# Patient Record
Sex: Male | Born: 1935 | Race: White | Hispanic: No | Smoking: Former smoker
Health system: Southern US, Community
[De-identification: ages and names within clinical notes are randomized; demographics above are authoritative.]

## PROBLEM LIST (undated history)

## (undated) DIAGNOSIS — J449 Chronic obstructive pulmonary disease, unspecified: Secondary | ICD-10-CM

## (undated) DIAGNOSIS — F319 Bipolar disorder, unspecified: Secondary | ICD-10-CM

## (undated) DIAGNOSIS — IMO0001 Reserved for inherently not codable concepts without codable children: Secondary | ICD-10-CM

## (undated) DIAGNOSIS — E039 Hypothyroidism, unspecified: Secondary | ICD-10-CM

## (undated) DIAGNOSIS — C439 Malignant melanoma of skin, unspecified: Secondary | ICD-10-CM

## (undated) DIAGNOSIS — E162 Hypoglycemia, unspecified: Secondary | ICD-10-CM

## (undated) DIAGNOSIS — E669 Obesity, unspecified: Secondary | ICD-10-CM

## (undated) DIAGNOSIS — J189 Pneumonia, unspecified organism: Secondary | ICD-10-CM

## (undated) DIAGNOSIS — F329 Major depressive disorder, single episode, unspecified: Secondary | ICD-10-CM

## (undated) DIAGNOSIS — F32A Depression, unspecified: Secondary | ICD-10-CM

## (undated) DIAGNOSIS — E079 Disorder of thyroid, unspecified: Secondary | ICD-10-CM

## (undated) DIAGNOSIS — I440 Atrioventricular block, first degree: Secondary | ICD-10-CM

## (undated) DIAGNOSIS — I10 Essential (primary) hypertension: Secondary | ICD-10-CM

## (undated) HISTORY — DX: Obesity, unspecified: E66.9

## (undated) HISTORY — DX: Hypoglycemia, unspecified: E16.2

## (undated) HISTORY — DX: Hypothyroidism, unspecified: E03.9

## (undated) HISTORY — DX: Essential (primary) hypertension: I10

## (undated) HISTORY — DX: Bipolar disorder, unspecified: F31.9

## (undated) HISTORY — DX: Chronic obstructive pulmonary disease, unspecified: J44.9

## (undated) HISTORY — DX: Atrioventricular block, first degree: I44.0

## (undated) HISTORY — DX: Reserved for inherently not codable concepts without codable children: IMO0001

---
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
## 1968-01-18 DIAGNOSIS — J189 Pneumonia, unspecified organism: Secondary | ICD-10-CM

## 1968-01-18 HISTORY — DX: Pneumonia, unspecified organism: J18.9

## 1997-04-29 ENCOUNTER — Ambulatory Visit (HOSPITAL_COMMUNITY): Admission: RE | Admit: 1997-04-29 | Discharge: 1997-04-29 | Payer: Self-pay | Admitting: Family Medicine

## 1997-06-18 ENCOUNTER — Ambulatory Visit (HOSPITAL_COMMUNITY): Admission: RE | Admit: 1997-06-18 | Discharge: 1997-06-18 | Payer: Self-pay | Admitting: Psychiatry

## 1997-10-13 ENCOUNTER — Ambulatory Visit (HOSPITAL_COMMUNITY): Admission: RE | Admit: 1997-10-13 | Discharge: 1997-10-13 | Payer: Self-pay | Admitting: Psychiatry

## 1998-04-10 ENCOUNTER — Ambulatory Visit (HOSPITAL_COMMUNITY): Admission: RE | Admit: 1998-04-10 | Discharge: 1998-04-10 | Payer: Self-pay | Admitting: Psychiatry

## 1999-12-23 ENCOUNTER — Ambulatory Visit (HOSPITAL_COMMUNITY): Admission: RE | Admit: 1999-12-23 | Discharge: 1999-12-23 | Payer: Self-pay | Admitting: Family Medicine

## 2001-06-04 ENCOUNTER — Encounter (INDEPENDENT_AMBULATORY_CARE_PROVIDER_SITE_OTHER): Payer: Self-pay | Admitting: *Deleted

## 2001-06-04 ENCOUNTER — Ambulatory Visit (HOSPITAL_COMMUNITY): Admission: RE | Admit: 2001-06-04 | Discharge: 2001-06-04 | Payer: Self-pay | Admitting: Gastroenterology

## 2003-11-27 ENCOUNTER — Encounter: Admission: RE | Admit: 2003-11-27 | Discharge: 2003-11-27 | Payer: Self-pay | Admitting: Otolaryngology

## 2003-11-28 ENCOUNTER — Ambulatory Visit (HOSPITAL_BASED_OUTPATIENT_CLINIC_OR_DEPARTMENT_OTHER): Admission: RE | Admit: 2003-11-28 | Discharge: 2003-11-28 | Payer: Self-pay | Admitting: Otolaryngology

## 2003-11-28 ENCOUNTER — Ambulatory Visit (HOSPITAL_COMMUNITY): Admission: RE | Admit: 2003-11-28 | Discharge: 2003-11-28 | Payer: Self-pay | Admitting: Otolaryngology

## 2004-03-27 ENCOUNTER — Emergency Department (HOSPITAL_COMMUNITY): Admission: EM | Admit: 2004-03-27 | Discharge: 2004-03-27 | Payer: Self-pay | Admitting: Emergency Medicine

## 2004-08-27 ENCOUNTER — Ambulatory Visit (HOSPITAL_COMMUNITY): Admission: RE | Admit: 2004-08-27 | Discharge: 2004-08-27 | Payer: Self-pay | Admitting: Otolaryngology

## 2004-08-27 ENCOUNTER — Ambulatory Visit (HOSPITAL_BASED_OUTPATIENT_CLINIC_OR_DEPARTMENT_OTHER): Admission: RE | Admit: 2004-08-27 | Discharge: 2004-08-27 | Payer: Self-pay | Admitting: Otolaryngology

## 2005-12-19 ENCOUNTER — Ambulatory Visit: Payer: Self-pay | Admitting: Internal Medicine

## 2006-01-17 HISTORY — PX: OTHER SURGICAL HISTORY: SHX169

## 2006-12-18 DIAGNOSIS — E785 Hyperlipidemia, unspecified: Secondary | ICD-10-CM | POA: Insufficient documentation

## 2006-12-18 DIAGNOSIS — G4733 Obstructive sleep apnea (adult) (pediatric): Secondary | ICD-10-CM

## 2006-12-18 DIAGNOSIS — J449 Chronic obstructive pulmonary disease, unspecified: Secondary | ICD-10-CM

## 2006-12-18 DIAGNOSIS — Z85820 Personal history of malignant melanoma of skin: Secondary | ICD-10-CM | POA: Insufficient documentation

## 2006-12-18 DIAGNOSIS — E119 Type 2 diabetes mellitus without complications: Secondary | ICD-10-CM | POA: Insufficient documentation

## 2006-12-19 ENCOUNTER — Ambulatory Visit: Payer: Self-pay | Admitting: Internal Medicine

## 2006-12-20 ENCOUNTER — Ambulatory Visit: Payer: Self-pay | Admitting: Internal Medicine

## 2007-01-18 HISTORY — PX: OTHER SURGICAL HISTORY: SHX169

## 2007-12-19 ENCOUNTER — Ambulatory Visit: Payer: Self-pay | Admitting: Internal Medicine

## 2007-12-19 DIAGNOSIS — F3176 Bipolar disorder, in full remission, most recent episode depressed: Secondary | ICD-10-CM | POA: Insufficient documentation

## 2008-01-18 HISTORY — PX: URETHRA SURGERY: SHX824

## 2008-02-28 ENCOUNTER — Encounter (INDEPENDENT_AMBULATORY_CARE_PROVIDER_SITE_OTHER): Payer: Self-pay | Admitting: *Deleted

## 2008-08-05 ENCOUNTER — Ambulatory Visit: Payer: Self-pay | Admitting: Diagnostic Radiology

## 2008-08-05 ENCOUNTER — Emergency Department (HOSPITAL_BASED_OUTPATIENT_CLINIC_OR_DEPARTMENT_OTHER): Admission: EM | Admit: 2008-08-05 | Discharge: 2008-08-05 | Payer: Self-pay | Admitting: Emergency Medicine

## 2008-08-07 ENCOUNTER — Emergency Department (HOSPITAL_COMMUNITY): Admission: EM | Admit: 2008-08-07 | Discharge: 2008-08-08 | Payer: Self-pay | Admitting: Emergency Medicine

## 2008-08-12 ENCOUNTER — Ambulatory Visit (HOSPITAL_BASED_OUTPATIENT_CLINIC_OR_DEPARTMENT_OTHER): Admission: RE | Admit: 2008-08-12 | Discharge: 2008-08-12 | Payer: Self-pay | Admitting: Urology

## 2008-08-19 ENCOUNTER — Ambulatory Visit (HOSPITAL_COMMUNITY): Admission: RE | Admit: 2008-08-19 | Discharge: 2008-08-19 | Payer: Self-pay | Admitting: Urology

## 2008-08-21 ENCOUNTER — Ambulatory Visit (HOSPITAL_COMMUNITY): Admission: RE | Admit: 2008-08-21 | Discharge: 2008-08-21 | Payer: Self-pay | Admitting: Urology

## 2008-09-29 ENCOUNTER — Encounter (INDEPENDENT_AMBULATORY_CARE_PROVIDER_SITE_OTHER): Payer: Self-pay | Admitting: Urology

## 2008-09-29 ENCOUNTER — Inpatient Hospital Stay (HOSPITAL_COMMUNITY): Admission: RE | Admit: 2008-09-29 | Discharge: 2008-10-01 | Payer: Self-pay | Admitting: Urology

## 2009-02-18 ENCOUNTER — Ambulatory Visit (HOSPITAL_COMMUNITY): Admission: RE | Admit: 2009-02-18 | Discharge: 2009-02-18 | Payer: Self-pay | Admitting: Urology

## 2009-04-09 ENCOUNTER — Encounter: Admission: RE | Admit: 2009-04-09 | Discharge: 2009-04-09 | Payer: Self-pay | Admitting: Urology

## 2009-04-13 ENCOUNTER — Ambulatory Visit (HOSPITAL_BASED_OUTPATIENT_CLINIC_OR_DEPARTMENT_OTHER): Admission: RE | Admit: 2009-04-13 | Discharge: 2009-04-13 | Payer: Self-pay | Admitting: Urology

## 2009-07-09 ENCOUNTER — Encounter: Payer: Self-pay | Admitting: Internal Medicine

## 2009-10-28 ENCOUNTER — Ambulatory Visit (HOSPITAL_COMMUNITY): Admission: RE | Admit: 2009-10-28 | Discharge: 2009-10-28 | Payer: Self-pay | Admitting: Urology

## 2010-02-16 NOTE — Letter (Signed)
Summary: CMN for CPAP Supplies/[facility]  CMN for CPAP Supplies/[facility]   Imported By: Sherian Rein 07/22/2009 13:37:51  _____________________________________________________________________  External Attachment:    Type:   Image     Comment:   External Document

## 2010-04-07 ENCOUNTER — Other Ambulatory Visit (HOSPITAL_COMMUNITY): Payer: Self-pay | Admitting: Urology

## 2010-04-07 DIAGNOSIS — N320 Bladder-neck obstruction: Secondary | ICD-10-CM

## 2010-04-12 LAB — POCT HEMOGLOBIN-HEMACUE: Hemoglobin: 14.5 g/dL (ref 13.0–17.0)

## 2010-04-15 ENCOUNTER — Encounter (HOSPITAL_COMMUNITY)
Admission: RE | Admit: 2010-04-15 | Discharge: 2010-04-15 | Disposition: A | Discharge status: Home / Self Care | Payer: Medicare Other | Source: Ambulatory Visit | Attending: Urology | Admitting: Urology

## 2010-04-15 DIAGNOSIS — Z87442 Personal history of urinary calculi: Secondary | ICD-10-CM | POA: Insufficient documentation

## 2010-04-15 DIAGNOSIS — N135 Crossing vessel and stricture of ureter without hydronephrosis: Secondary | ICD-10-CM | POA: Insufficient documentation

## 2010-04-15 DIAGNOSIS — N320 Bladder-neck obstruction: Secondary | ICD-10-CM

## 2010-04-15 MED ORDER — TECHNETIUM TC 99M MERTIATIDE
15.0000 | Freq: Once | INTRAVENOUS | Status: AC | PRN
  Administered 2010-04-15: 15 via INTRAVENOUS

## 2010-04-15 MED ORDER — FUROSEMIDE 10 MG/ML IJ SOLN: 51.8000 mg | Freq: Once | INTRAMUSCULAR | Status: DC

## 2010-04-23 LAB — ABO/RH: ABO/RH(D): O POS

## 2010-04-23 LAB — BASIC METABOLIC PANEL
BUN: 11 mg/dL (ref 6–23)
BUN: 20 mg/dL (ref 6–23)
BUN: 9 mg/dL (ref 6–23)
CO2: 25 mEq/L (ref 19–32)
CO2: 28 mEq/L (ref 19–32)
CO2: 30 mEq/L (ref 19–32)
Calcium: 8.3 mg/dL — ABNORMAL LOW (ref 8.4–10.5)
Calcium: 8.4 mg/dL (ref 8.4–10.5)
Calcium: 9.3 mg/dL (ref 8.4–10.5)
Chloride: 105 mEq/L (ref 96–112)
Chloride: 108 mEq/L (ref 96–112)
Chloride: 109 mEq/L (ref 96–112)
Creatinine, Ser: 1.11 mg/dL (ref 0.4–1.5)
Creatinine, Ser: 1.17 mg/dL (ref 0.4–1.5)
Creatinine, Ser: 1.25 mg/dL (ref 0.4–1.5)
GFR calc Af Amer: >60 mL/min (ref >60)
GFR calc Af Amer: >60 mL/min (ref >60)
GFR calc Af Amer: >60 mL/min (ref >60)
GFR calc non Af Amer: 57 mL/min — ABNORMAL LOW (ref >60)
GFR calc non Af Amer: >60 mL/min (ref >60)
GFR calc non Af Amer: >60 mL/min (ref >60)
Glucose, Bld: 103 mg/dL — ABNORMAL HIGH (ref 70–99)
Glucose, Bld: 135 mg/dL — ABNORMAL HIGH (ref 70–99)
Glucose, Bld: 148 mg/dL — ABNORMAL HIGH (ref 70–99)
Potassium: 3.9 mEq/L (ref 3.5–5.1)
Potassium: 3.9 mEq/L (ref 3.5–5.1)
Potassium: 4.1 mEq/L (ref 3.5–5.1)
Sodium: 140 mEq/L (ref 135–145)
Sodium: 141 mEq/L (ref 135–145)
Sodium: 142 mEq/L (ref 135–145)

## 2010-04-23 LAB — CBC
HCT: 40.5 % (ref 39.0–52.0)
Hemoglobin: 13.6 g/dL (ref 13.0–17.0)
MCHC: 33.5 g/dL (ref 30.0–36.0)
MCV: 98.6 fL (ref 78.0–100.0)
Platelets: 140 10*3/uL — ABNORMAL LOW (ref 150–400)
RBC: 4.11 MIL/uL — ABNORMAL LOW (ref 4.22–5.81)
RDW: 13.7 % (ref 11.5–15.5)
WBC: 4.4 10*3/uL (ref 4.0–10.5)

## 2010-04-23 LAB — TYPE AND SCREEN
ABO/RH(D): O POS
Antibody Screen: NEGATIVE

## 2010-04-23 LAB — HEMOGLOBIN AND HEMATOCRIT, BLOOD
HCT: 35.7 % — ABNORMAL LOW (ref 39.0–52.0)
HCT: 41.7 % (ref 39.0–52.0)
Hemoglobin: 11.7 g/dL — ABNORMAL LOW (ref 13.0–17.0)
Hemoglobin: 12.3 g/dL — ABNORMAL LOW (ref 13.0–17.0)
Hemoglobin: 14 g/dL (ref 13.0–17.0)

## 2010-04-23 LAB — CREATININE, FLUID (PLEURAL, PERITONEAL, JP DRAINAGE)

## 2010-04-25 LAB — URINALYSIS, ROUTINE W REFLEX MICROSCOPIC
Bilirubin Urine: NEGATIVE
Glucose, UA: NEGATIVE mg/dL
Ketones, ur: 15 mg/dL — AB
Nitrite: NEGATIVE
Nitrite: NEGATIVE
Protein, ur: NEGATIVE mg/dL
Specific Gravity, Urine: 1.007 (ref 1.005–1.030)
Urobilinogen, UA: 1 mg/dL (ref 0.0–1.0)
pH: 7 (ref 5.0–8.0)

## 2010-04-25 LAB — BASIC METABOLIC PANEL
BUN: 19 mg/dL (ref 6–23)
Chloride: 105 mEq/L (ref 96–112)
GFR calc Af Amer: >60 mL/min (ref >60)
GFR calc non Af Amer: >60 mL/min (ref >60)
Potassium: 4.1 mEq/L (ref 3.5–5.1)
Sodium: 141 mEq/L (ref 135–145)

## 2010-04-25 LAB — DIFFERENTIAL
Basophils Absolute: 0.1 10*3/uL (ref 0.0–0.1)
Basophils Relative: 1 % (ref 0–1)
Eosinophils Relative: 0 % (ref 0–5)
Monocytes Absolute: 1.2 10*3/uL — ABNORMAL HIGH (ref 0.1–1.0)
Neutro Abs: 8.8 10*3/uL — ABNORMAL HIGH (ref 1.7–7.7)

## 2010-04-25 LAB — POCT I-STAT, CHEM 8
Calcium, Ion: 1.05 mmol/L — ABNORMAL LOW (ref 1.12–1.32)
Glucose, Bld: 97 mg/dL (ref 70–99)
HCT: 43 % (ref 39.0–52.0)
Hemoglobin: 14.6 g/dL (ref 13.0–17.0)

## 2010-04-25 LAB — CBC
HCT: 45.6 % (ref 39.0–52.0)
Hemoglobin: 14.9 g/dL (ref 13.0–17.0)
MCV: 99.6 fL (ref 78.0–100.0)
Platelets: 121 10*3/uL — ABNORMAL LOW (ref 150–400)
RBC: 4.58 MIL/uL (ref 4.22–5.81)
WBC: 10.5 10*3/uL (ref 4.0–10.5)

## 2010-04-25 LAB — COMPREHENSIVE METABOLIC PANEL
AST: 25 U/L (ref 0–37)
Albumin: 3.6 g/dL (ref 3.5–5.2)
Alkaline Phosphatase: 62 U/L (ref 39–117)
BUN: 21 mg/dL (ref 6–23)
CO2: 20 mEq/L (ref 19–32)
Chloride: 110 mEq/L (ref 96–112)
GFR calc non Af Amer: 46 mL/min — ABNORMAL LOW (ref >60)
Potassium: 4.1 mEq/L (ref 3.5–5.1)
Total Bilirubin: 1 mg/dL (ref 0.3–1.2)

## 2010-04-25 LAB — LIPASE, BLOOD: Lipase: 13 U/L (ref 11–59)

## 2010-05-27 IMAGING — CT CT ANGIO ABDOMEN
1 of 5 series · 8 of 46 positions shown, 13 images · IV contrast (APPLIED)
Comparison: 08/07/2008

CLINICAL DATA: Right hydronephrosis and ureterectasis.

CT ANGIOGRAPHY ABDOMEN AND PELVIS
TECHNIQUE: Multidetector CT imaging of the abdomen and pelvis
using the standard protocol during bolus administration of
intravenous contrast. Multiplanar reconstructed images obtained and
reviewed to evaluate the vascular anatomy.
Contrast: 125 ml Omnipaque 350 IV

[Series 7: venous 5.0 b30f · axial · portal-venous · 0.82mm/px · z∈[-361,-131]mm · 8 of 60 slices shown, 13 images]
[im 7/60  soft-tissue]
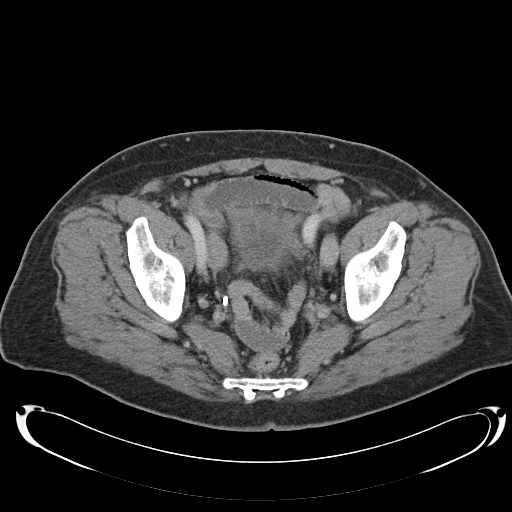
[im 7/60  bone]
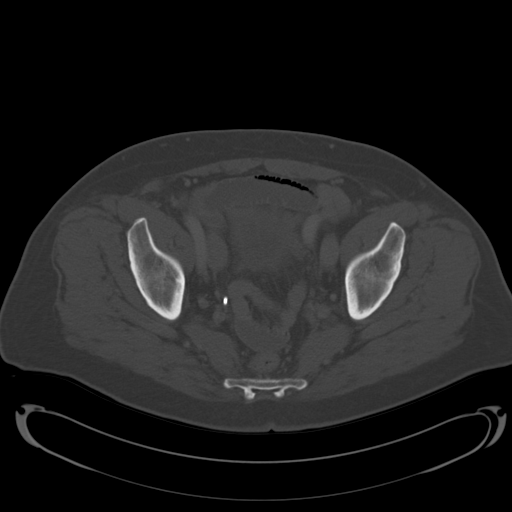
[im 14/60  soft-tissue]
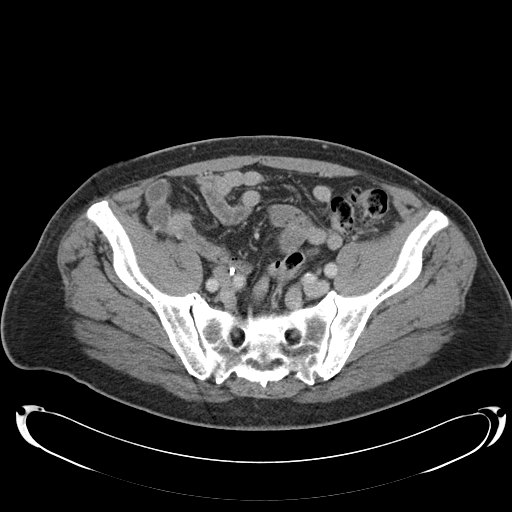
[im 20/60  soft-tissue]
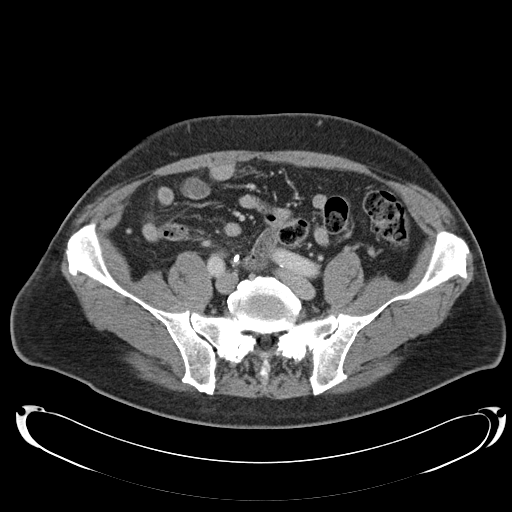
[im 27/60  soft-tissue]
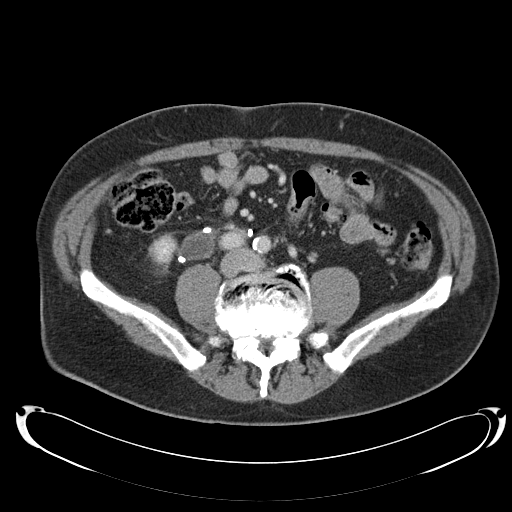
[im 33/60  soft-tissue]
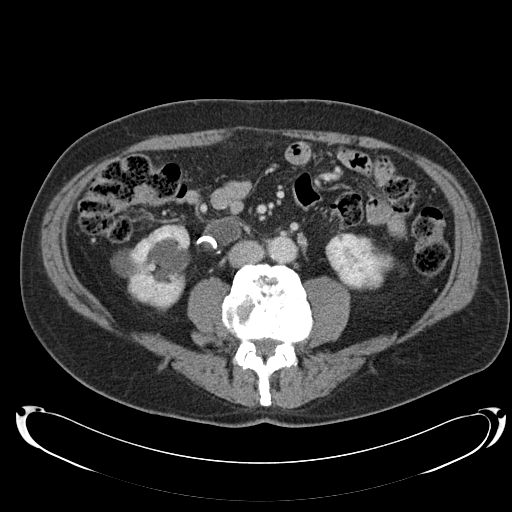
[im 33/60  lung]
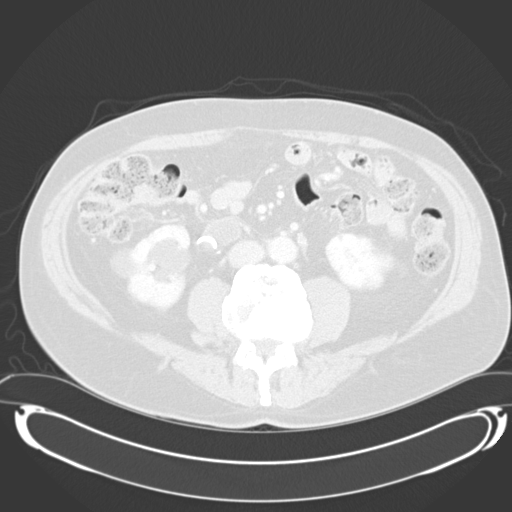
[im 40/60  soft-tissue]
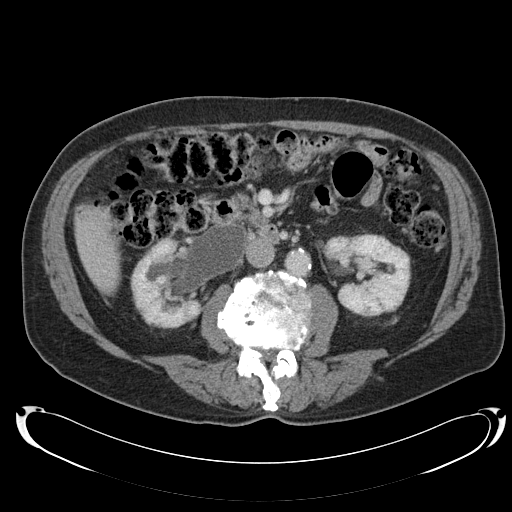
[im 40/60  lung]
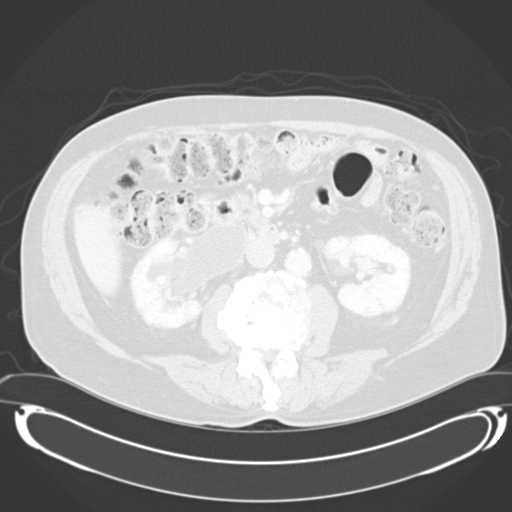
[im 46/60  soft-tissue]
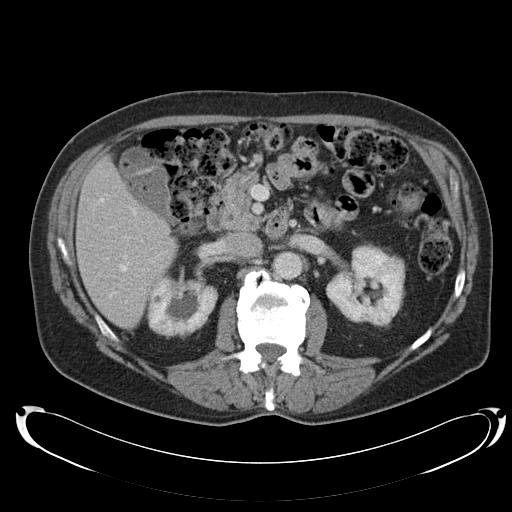
[im 46/60  lung]
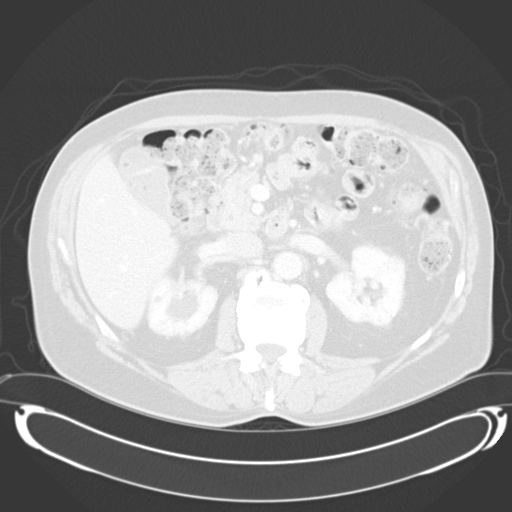
[im 53/60  soft-tissue]
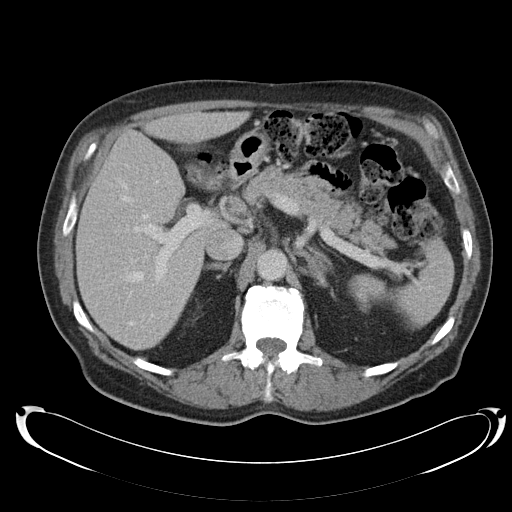
[im 53/60  lung]
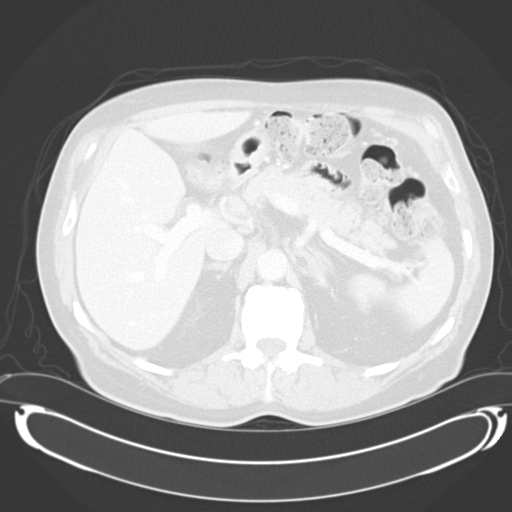

[8 of 46 positions shown; findings below may reference images not displayed]

FINDINGS: Atheromatous nondilated abdominal aorta without
dissection or stenosis.  Celiac axis widely patent.  SMA widely
patent with replaced right hepatic arterial supply, an anatomic
variant.  There is a single left renal artery, unremarkable.  There
are two right renal arteries.  The superior right renal artery is
dominant, without stenosis or other focal lesion.  The inferior
right renal artery is diminutive with an aberrant origin below the
level of the IMA origin, and supplies only a portion of the lower
pole.  It crosses just anterior to the right ureter at the  level
of transition from proximal ureterectasis to the distal
nondistended ureter. The inferior mesenteric artery is patent.
There is plaque at the aortic bifurcation without stenosis.  There
is tortuosity of bilateral common and external iliac arteries
without stenosis.

Venous phase shows patency of hepatic veins, portal vein, splenic
vein, superior mesenteric vein, bilateral renal veins, and IVC.

There is symmetric renal parenchymal enhancement.  Multiple small
bilateral renal cysts, largest from the lower pole right kidney 29
mm in diameter.  There has been significant improvement in the
right hydronephrosis and proximal ureterectasis, with placement of
a double-J ureteral stent.  Urinary bladder is physiologically
distended.  Enlarged prostate is partially seen.  Multiple gas
containing stones are noted in the lumen of the gallbladder which
is nondilated.  Unremarkable visualized portions of liver and
spleen, pancreas, and right adrenal gland.  Stable left adrenal
adenoma.  Advanced spondylitic changes at multiple levels in the
lumbar spine.  Small bowel decompressed.  No free air.  No ascites.
Small umbilical hernia containing only a small amount of mesenteric
fat.
IMPRESSION: 1.  Aberrant inferior right renal artery crosses just anterior to
the right to ureter at the level of transition from proximal
ureterectasis to  normal   caliber.
2.  Interval placement of right ureteral stent with   improvement
in the hydronephrosis and proximal ureterectasis.
3.  Cholelithiasis.
4.  Umbilical hernia.

## 2010-06-01 NOTE — Op Note (Signed)
NAME:  Victor Osborne, Victor Osborne NO.:  [id number]   MEDICAL RECORD NO.:  [id number]          PATIENT TYPE:  AMB   LOCATION:  [facility]                         FACILITY:  [facility]   PHYSICIAN:  Lindaann Slough, M.D.  DATE OF BIRTH:  29-Aug-1932   DATE OF PROCEDURE:  08/12/2008  DATE OF DISCHARGE:                               OPERATIVE REPORT   PREOPERATIVE DIAGNOSIS:  Right hydronephrosis.   POSTOPERATIVE DIAGNOSIS:  Right hydronephrosis.   PROCEDURE:  Cystoscopy, right retrograde pyelogram and insertion of  double-J catheter.   SURGEON:  Dr. Brunilda Payor.   ANESTHESIA:  General.   INDICATIONS:  The patient is a 77 year old male who has been complaining  of back pain on and off for the past 2 years.  About a week ago, he  started having severe right flank pain and went to the emergency room.  A CT scan showed marked right hydronephrosis and hydroureter down to the  sacral promontory without definite evidence of obstruction.  There is no  ureteral stone.  He had appendectomy several years ago.  He is scheduled  today for cystoscopy, retrograde pyelogram and insertion of double-J  stent.   The patient was identified by his wristband, and proper time out was  taken.   Under general anesthesia, the patient was prepped and draped and placed  in the dorsal lithotomy position.  A panendoscope was inserted in the  bladder.  The anterior urethra is normal.  He has trilobar prostatic  hypertrophy.  The bladder mucosa is normal.  There is no stone or tumor  in the bladder.  The ureteral orifices are in normal position and shape.   Retrograde pyelogram with interpretation.   A cone-tip catheter was passed through the cystoscope into the right  ureteral orifice, and contrast was then injected through the cone-tip  catheter.  The distal ureter appears normal.  At the level of the sacral  promontory, the ureter was very tortuous and had a S shape without  evidence of filling defect or  obstruction at that point.  And the ureter  proximal to the sacral promontory was markedly dilated.  The cone-tip  catheter was removed.  The Glidewire was passed through a #5-French open-  ended catheter, and the Glidewire with the open-ended catheter were  passed through the cystoscope into the right ureteral orifice.  The  Glidewire was advanced into the ureter all the way up into the kidney.  The open-ended catheter was then advanced over the Glidewire through the  S deformity of the ureter.  Contrast was then injected through the open-  ended catheter, and the ureter was markedly dilated, but contrast could  not feel the proximal ureter nor the renal pelvis.  The ureteral  catheter could not be advanced all the way up into the renal pelvis  because the ureteral catheter was not long enough.  Because of the  deformity of the ureter it could not go beyond the dilated ureter above  the sacral promontory.   A sensor-tip guidewire was then passed through the open-ended catheter  all the way up into the kidney, and  the open-ended catheter was removed.  A #7-French 28 double-J stent was then passed over the guidewire.  The  proximal curl of the double-J catheter could only reach the junction of  the proximal and mid ureter.  The distal curl is in the bladder.  The  double-J catheter was then left in that position to drain the kidney.  The bladder was then emptied, and the guide wire and cystoscope were  removed.   The patient tolerated the procedure well and left the OR in satisfactory  condition to post-anesthesia care unit.   The question at this point is whether the patient has retro iliac ureter  with S deformity of the ureter.      Lindaann Slough, M.D.  Electronically Signed     MN/MEDQ  D:  08/12/2008  T:  08/12/2008  Job:  [id number]

## 2010-06-01 NOTE — Assessment & Plan Note (Signed)
[facility]                             PULMONARY OFFICE NOTE   NAME:Victor Osborne, Victor Osborne                     MRN:          [id number]  DATE:12/19/2006                            DOB:          05-25-1932    PROBLEM LIST:  1. Obstructive sleep apnea.  2. Mild chronic obstructive pulmonary disease.   HISTORY:  He says wheezing and hacking are fairly persistent, a little  worse with the winter weather.  He gives a history of heart block, and  says he continues to see Dr. Laurelyn Sickle for his bipolar disorder.  He  credits his willpower for losing nearly 60 pounds over the last year and  a half, but says he remains quite comfortable with CPAP set at 9 CWP and  does not want to change.  He sleeps better with it, fine where it is.  Chronic tremor, left hand.  Has had flu vaccine.  Had first pneumococcal  vaccine, 2004.  Is seeking a new primary physician.   MEDICATIONS:  1. Carbamazepine 200 mg.  2. Fluoxetine 2 mg t.i.d.  3. Synthroid 125 mcg.  4. Trifluoperazine 1 b.i.d.  5. Lipitor 40 mg.  6. Zetia 10 mg.  7. CPAP 9 CWP ([facility]).   No medication allergy.   OBJECTIVE:  Weight 229 pounds, BP 120/78, pulse 60, room air saturation  100%.  Tremor, left hand.  He is alert, seems comfortable.  Minor cough, no adenopathy.  Heart sounds regular without murmur.  No edema.   IMPRESSION:  Obstructive sleep apnea is benefitting directly from  significant weight loss.  It is not a casual matter to lose this much  weight, and we got him to agree to a chest x-ray.  There is some degree  of, at least, mild chronic bronchitis.   PLAN:  Chest x-ray, pulmonary function test.  Schedule return 1 year,  earlier p.r.n.  Continue efforts of good sleep hygiene.  Continue  continuous positive airway pressure.     Clinton D. Maple Hudson, MD, Tonny Bollman, FACP  Electronically Signed    CDY/MedQ  DD: 12/19/2006  DT: 12/20/2006  Job #: [id number]

## 2010-06-04 NOTE — Assessment & Plan Note (Signed)
[facility]                             PULMONARY OFFICE NOTE   NAME:Victor Osborne, Victor Osborne                     MRN:          [id number]  DATE:12/19/2005                            DOB:          22-Mar-1932    PULMONARY/SLEEP FOLLOWUP   PROBLEMS:  This is a former chest disease and allergy patient last seen  there in 2005, and coming in now to reestablish for continuity followup  for COPD and obstructive sleep apnea.  He had stopped smoking 21 years  ago.  Office spirometry in November of 2004 had shown an FEV1 of 3200  (77% predicted), an FVC of 4400 (80%), and a ratio of 0.73.  He has been  using CPAP at 9 CWP, which he insists does a super job, and I sleep  great.  From [facility].  He is walking on a treadmill about a  half hour a day, but admits he has been skipping more recently.  He does  not notice dyspnea with that level of exercise.  He quit Advair, which  did not seem to be much help.  He and his wife are watching their diets  very carefully, and he has lost about 50 pounds in the last 2 years,  mostly with the diet change.  Usual bedtime around 8 p.m., estimating  sleep onset 15 minutes, and awakening twice during the night before  final waking at 6 a.m.   REVIEW OF SYSTEMS:  Occasionally, if he exercises very hard, he will  have crawling sensations in his legs.  He denies cough, wheeze, chest  pain, palpitations, sputum, ankle edema, blood, or fever.  There is  occasional bruxism and he does admit to some depression.   PAST HISTORY:  Hospitalized twice with pneumonias.  Diagnosed with  emphysema.  Bipolar.  Lithium affected his thyroid and he is on thyroid  replacement.  Surgical resection of a melanoma from his forehead.  Elevated cholesterol.  Surgery for appendectomy.  No history of heart  disease otherwise.   SOCIAL HISTORY:  Quit smoking at age 89, 21 years ago.  Quit alcohol in  1976.  Married, retired from [employer], [employer] work.   FAMILY HISTORY:  Cancer.  His wife is also on CPAP.   OBJECTIVE:  Weight 244 pounds, BP 132/82, pulse regular 53, room air  saturation 100%.  This is a tall, bearded gentleman, pleasant and alert.  Palate length 2/4.  There is no neck vein distension or stridor.  No thyromegaly.  LUNGS:  Fields are clear.  HEART:  Sounds regular without murmur.  There is no tremor, cyanosis, or edema.   IMPRESSION:  1. Obstructive sleep apnea, well controlled and comfortable on      continuous positive airway pressure at 9 centimeters of water      pressure through [place] with no changes intended at this time.  We      talked about treatment options, and emphasized his responsibility      to drive carefully.  2. Mild chronic obstructive pulmonary disease residual from his  smoking history.   PLAN:  No specific changes needed.  Return in 12 months, earlier p.r.n.     Clinton D. Maple Hudson, MD, Tonny Bollman, FACP  Electronically Signed    CDY/MedQ  DD: 12/19/2005  DT: 12/20/2005  Job #: [id number]   cc:   Al Decant. Janey Greaser, MD

## 2010-06-04 NOTE — Op Note (Signed)
NAME:  Victor Osborne, Victor Osborne NO.:  [id number]   MEDICAL RECORD NO.:  [id number]          PATIENT TYPE:  AMB   LOCATION:  [facility]                          FACILITY:  [facility]   PHYSICIAN:  Christopher E. 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   OPERATION:  Left meatoplasty with canalplasty.   SURGEON:  Kristine Garbe. Ezzard Standing, M.D.   ANESTHESIA:  General endotracheal.   COMPLICATIONS:  None.   BRIEF CLINICAL NOTE:  [name] is a 77 year old gentleman who has very  narrowed ear canals bilaterally with bony exostoses.  I could only  adequately visualize the posterior one-quarter of the eardrum.  He has  exostoses anteriorly as well as some posteriorly superiorly.  He has had  recurrent bouts of external otitis over the past year I suspect are related  to the very narrowed ear canals.  He is taken to the operating room at this  time for canalplasty to enlarge the ear canals.   DESCRIPTION OF PROCEDURE:  After adequate endotracheal anesthesia, the ear  was cleaned and irrigated with saline.  The ear canal was then injected with  Xylocaine with epinephrine for a local anesthetic.  The primary narrowing or  bulge of the external bony ear canal was anteriorly, which obscured  visualization of the anterior three-quarters of the TM.  There was  additionally some bony exostosis posteriorly superiorly.  An anterior-based  tympanomeatal flap was elevated down to the annulus.  After elevating the  tympanomeatal flap down to the annulus, it was reflected posteriorly.  Using  the 1 mm cutting bur, the bony prominence in the anterior ear canal was  removed.  At one point I used a small 2 mm osteotome to help to knock off  some of the edge of the bone.  I also used bone curettes.  After removing  several millimeters of anterior prominent bone, I was able to visualize the  umbo and a portion of the anterior eardrum.  This completed the anterior  portion of the procedure.  Next there was another exostosis posterior  superiorly, and mucoperiosteum was elevated off of this and using the 1 mm  cutting bur, this bony exostosis was removed.  This completed the procedure.  The ear was irrigated with saline and cleaned of all the bony fragments and  bone dust.  The ear canal was then packed with Gelfoam soaked in Ciprodex.  A cotton ball was placed as an external ear dressing.  The patient received  1 g of Ancef IV intraoperatively.   DISPOSITION:  [name] is discharged home later this morning on Keflex 500 mg  b.i.d. for five days, Tylenol and Tylenol No. 3 p.r.n. pain.  We will have  him follow up in my office in three weeks for recheck and  cleaning the ear  canal.  I instructed him to keep all water out of the ear.       CEN/MEDQ  D:  11/28/2003  T:  11/29/2003  Job:  [id number]

## 2010-06-04 NOTE — Op Note (Signed)
NAME:  [name], [name] NO.:  [id number]   MEDICAL RECORD NO.:  [id number]          PATIENT TYPE:  AMB   LOCATION:  [facility]                          FACILITY:  [facility]   PHYSICIAN:  Kristine Garbe. Ezzard Standing, M.D.DATE OF BIRTH:  1932/04/04   DATE OF PROCEDURE:  08/27/2004  DATE OF DISCHARGE:                                 OPERATIVE REPORT   PREOPERATIVE DIAGNOSIS:  Right external ear canal stenosis with exostosis.   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   OPERATION:  Right external ear canalplasty.   SURGEON:  Kristine Garbe. Ezzard Standing, M.D.   ANESTHESIA:  General endotracheal.   COMPLICATIONS:  None.   CLINICAL NOTE:  Victor Osborne is a 77 year old gentleman who has had  chronic ear canal problems because of very small ear canals with prominent  anterior canal exostosis.  He has had previous canalplasty on the left side  and has done well since having that done on the right side.  You can barely  visualize the posterior portion of the TM.  Had no visualization of the umbo  or malleus.  He has a tendency to build up wax and debris in the ear canal.  He is taken to the operating room at this time for right canalplasty.   DESCRIPTION OF PROCEDURE:  After adequate endotracheal anesthesia, the ear  canal was prepped with Betadine solution.  The patient received 1 gm of  Ancef preoperatively.  The ear canal was then further irrigated with saline.  The ear canal was injected with Xylocaine with epinephrine for hemostasis.  An anterior-based tympanomeatal flap was created just lateral to the  prominent anterior bony bulge exostosis.  The posterior portion of the canal  really was not involved.  Of note, he did have a very small ear canal  anyway.  After elevating the mucoperiosteum off of the prominent bony bulge,  using 1 mm cutting bur, 2 mm osteotome, and bone curettes, the prominent  bulge was removed.  This allowed visualization of the  umbo.  After I got  visualization of the umbo, the procedure was completed.  The tympanomeatal  flap mucosa was brought back up onto the reduced bone and osteoma.  The ear  was irrigated with saline.  The ear canal was then packed with the Gelfoam  soaked in Ciprodex.  A cotton ball was placed in the ear followed by a Band-  Aid.  He tolerated this well, was awakened from anesthesia, and transferred  to the recovery room and on postop doing well.   DISPOSITION:  [name] is discharged home late this morning on Tylenol #3  p.r.n. pain.  He is instructed to keep water out of the ear.  We will have  him follow up in my office in two weeks for recheck.       CEN/MEDQ  D:  08/27/2004  T:  08/27/2004  Job:  [id number]

## 2010-08-02 ENCOUNTER — Ambulatory Visit: Payer: Medicare Other | Attending: Neurology | Admitting: Physical Therapy

## 2010-08-02 DIAGNOSIS — IMO0001 Reserved for inherently not codable concepts without codable children: Secondary | ICD-10-CM | POA: Insufficient documentation

## 2010-08-02 DIAGNOSIS — R269 Unspecified abnormalities of gait and mobility: Secondary | ICD-10-CM | POA: Insufficient documentation

## 2010-08-05 ENCOUNTER — Ambulatory Visit: Payer: Medicare Other | Admitting: Physical Therapy

## 2010-08-10 ENCOUNTER — Ambulatory Visit: Payer: Medicare Other | Admitting: Physical Therapy

## 2010-08-13 ENCOUNTER — Ambulatory Visit: Payer: Medicare Other | Admitting: Physical Therapy

## 2010-08-16 ENCOUNTER — Ambulatory Visit: Payer: Medicare Other | Admitting: Physical Therapy

## 2010-08-18 ENCOUNTER — Ambulatory Visit: Payer: Medicare Other | Attending: Neurology | Admitting: Physical Therapy

## 2010-08-18 DIAGNOSIS — IMO0001 Reserved for inherently not codable concepts without codable children: Secondary | ICD-10-CM | POA: Insufficient documentation

## 2010-08-18 DIAGNOSIS — R269 Unspecified abnormalities of gait and mobility: Secondary | ICD-10-CM | POA: Insufficient documentation

## 2010-08-23 ENCOUNTER — Ambulatory Visit: Payer: Medicare Other | Admitting: Physical Therapy

## 2010-08-25 ENCOUNTER — Ambulatory Visit: Payer: Medicare Other | Admitting: Physical Therapy

## 2010-08-30 ENCOUNTER — Ambulatory Visit: Payer: Medicare Other | Admitting: Physical Therapy

## 2010-09-01 ENCOUNTER — Ambulatory Visit: Payer: Medicare Other | Admitting: Physical Therapy

## 2010-09-07 ENCOUNTER — Ambulatory Visit: Payer: Medicare Other | Admitting: Physical Therapy

## 2010-09-13 ENCOUNTER — Ambulatory Visit: Payer: Medicare Other | Admitting: Physical Therapy

## 2010-09-16 ENCOUNTER — Ambulatory Visit: Payer: Medicare Other | Admitting: Physical Therapy

## 2010-09-21 ENCOUNTER — Ambulatory Visit: Payer: Medicare Other | Attending: Neurology | Admitting: Physical Therapy

## 2010-09-21 DIAGNOSIS — R269 Unspecified abnormalities of gait and mobility: Secondary | ICD-10-CM | POA: Insufficient documentation

## 2010-09-21 DIAGNOSIS — IMO0001 Reserved for inherently not codable concepts without codable children: Secondary | ICD-10-CM | POA: Insufficient documentation

## 2010-09-23 ENCOUNTER — Other Ambulatory Visit: Payer: Self-pay | Admitting: Neurology

## 2010-09-23 DIAGNOSIS — G20 Parkinson's disease: Secondary | ICD-10-CM

## 2010-09-23 DIAGNOSIS — R269 Unspecified abnormalities of gait and mobility: Secondary | ICD-10-CM

## 2010-09-24 ENCOUNTER — Ambulatory Visit: Payer: Medicare Other | Admitting: Physical Therapy

## 2010-09-27 ENCOUNTER — Ambulatory Visit: Payer: Medicare Other | Admitting: Physical Therapy

## 2010-09-28 ENCOUNTER — Ambulatory Visit
Admission: RE | Admit: 2010-09-28 | Discharge: 2010-09-28 | Disposition: A | Discharge status: Home / Self Care | Payer: Medicare Other | Source: Ambulatory Visit | Attending: Neurology | Admitting: Neurology

## 2010-09-28 DIAGNOSIS — G20A1 Parkinson's disease without dyskinesia, without mention of fluctuations: Secondary | ICD-10-CM

## 2010-09-28 DIAGNOSIS — G20 Parkinson's disease: Secondary | ICD-10-CM

## 2010-09-28 DIAGNOSIS — R269 Unspecified abnormalities of gait and mobility: Secondary | ICD-10-CM

## 2010-09-29 ENCOUNTER — Ambulatory Visit: Payer: Medicare Other | Admitting: Physical Therapy

## 2010-10-07 ENCOUNTER — Other Ambulatory Visit (HOSPITAL_COMMUNITY): Payer: Self-pay | Admitting: Urology

## 2010-10-27 ENCOUNTER — Encounter (HOSPITAL_COMMUNITY)
Admission: RE | Admit: 2010-10-27 | Discharge: 2010-10-27 | Disposition: A | Discharge status: Home / Self Care | Payer: Medicare Other | Source: Ambulatory Visit | Attending: Urology | Admitting: Urology

## 2010-10-27 ENCOUNTER — Encounter (HOSPITAL_COMMUNITY): Payer: Self-pay

## 2010-10-27 DIAGNOSIS — Q6239 Other obstructive defects of renal pelvis and ureter: Secondary | ICD-10-CM | POA: Insufficient documentation

## 2010-10-27 MED ORDER — TECHNETIUM TC 99M MERTIATIDE: 15.5000 | Freq: Once | INTRAVENOUS | Status: AC | PRN

## 2010-10-27 MED ORDER — FUROSEMIDE 10 MG/ML IJ SOLN: 52.0000 mg | Freq: Once | INTRAMUSCULAR | Status: DC

## 2010-11-05 ENCOUNTER — Other Ambulatory Visit: Payer: Self-pay | Admitting: Dermatology

## 2010-12-02 ENCOUNTER — Ambulatory Visit: Payer: Medicare Other | Admitting: Internal Medicine

## 2010-12-27 ENCOUNTER — Encounter: Payer: Self-pay | Admitting: Internal Medicine

## 2010-12-28 ENCOUNTER — Ambulatory Visit (INDEPENDENT_AMBULATORY_CARE_PROVIDER_SITE_OTHER): Payer: Medicare Other | Admitting: Internal Medicine

## 2010-12-28 ENCOUNTER — Encounter: Payer: Self-pay | Admitting: Internal Medicine

## 2010-12-28 ENCOUNTER — Ambulatory Visit (INDEPENDENT_AMBULATORY_CARE_PROVIDER_SITE_OTHER)
Admission: RE | Admit: 2010-12-28 | Discharge: 2010-12-28 | Disposition: A | Discharge status: Home / Self Care | Payer: Medicare Other | Source: Ambulatory Visit | Attending: Internal Medicine | Admitting: Internal Medicine

## 2010-12-28 DIAGNOSIS — J449 Chronic obstructive pulmonary disease, unspecified: Secondary | ICD-10-CM

## 2010-12-28 DIAGNOSIS — G4733 Obstructive sleep apnea (adult) (pediatric): Secondary | ICD-10-CM

## 2010-12-28 NOTE — Patient Instructions (Signed)
Order- CXR- dx COPD  Order- [facility]- [facility]  CPAP 9 cwp with replacement mask of choice, humidifier and supplies   Dx OSA

## 2010-12-28 NOTE — Progress Notes (Signed)
12/28/10- 78 yoM former smoker, followed for COPD, OSA, complicated by BiPolar, HBP, hx melanoma, DM LOV-12/19/2007 Has had flu vaccine. He had changed his CPAP company to [employer], but complains that his mask is old and worn out he can't wear it. Therefore he has not used CPAP in some months. His wife tells him that without CPAP he snores loudly and talks in his sleep. He recognizes he is not sleeping as well. His COPD has been stable within significant cough and wheeze. He denies noticing shortness of breath while walking. He has no inhaled medications. CXR-04/09/2009-COPD, no active process.  ROS-see HPI Constitutional:   No-   weight loss, night sweats, fevers, chills, fatigue, lassitude. HEENT:   No-  headaches, difficulty swallowing, tooth/dental problems, sore throat,       No-  sneezing, itching, ear ache, nasal congestion, post nasal drip,  CV:  No-   chest pain, orthopnea, PND, swelling in lower extremities, anasarca,                                  dizziness, palpitations Resp: No-   shortness of breath with exertion or at rest.              No-   productive cough,  No non-productive cough,  No- coughing up of blood.              No-   change in color of mucus.  No- wheezing.   Skin: No-   rash or lesions. GI:  No-   heartburn, indigestion, abdominal pain, nausea, vomiting, diarrhea,                 change in bowel habits, loss of appetite GU: No-   dysuria, change in color of urine, no urgency or frequency.  No- flank pain. MS:  + joint pain or swelling.  No- decreased range of motion.  No- back pain. Neuro-     nothing unusual Psych:  No- change in mood or affect. No depression or anxiety.  No memory loss.  OBJ General- Alert, Oriented, Affect-appropriate, Distress- none acute, looks well Skin- rash-none, lesions- none, excoriation- none Lymphadenopathy- none Head- atraumatic            Eyes- Gross vision intact, PERRLA, conjunctivae clear secretions            Ears- Hearing,  canals-normal            Nose- Clear, no-Septal dev, mucus, polyps, erosion, perforation             Throat- Mallampati II , mucosa clear , drainage- none, tonsils- atrophic Neck- flexible , trachea midline, no stridor , thyroid nl, carotid no bruit Chest - symmetrical excursion , unlabored           Heart/CV- RRR , no murmur , no gallop  , no rub, nl s1 s2                           - JVD- none , edema- none, stasis changes- none, varices- none           Lung- slight rattle right base, unlabored, wheeze- none, cough- none , dullness-none, rub- none           Chest wall-  Abd- tender-no, distended-no, bowel sounds-present, HSM- no Br/ Gen/ Rectal- Not done, not indicated Extrem- cyanosis- none, clubbing, none, atrophy- none,  strength- nl, using a cane. L ankle wrapped "bone spurs" Neuro- grossly intact to observation

## 2010-12-31 ENCOUNTER — Telehealth: Payer: Self-pay | Admitting: Internal Medicine

## 2010-12-31 NOTE — Telephone Encounter (Signed)
Spoke with patient about his CXR results. Pt understands and no questions at this time.

## 2010-12-31 NOTE — Progress Notes (Signed)
Quick Note:  Pt aware of results. ______ 

## 2011-01-01 NOTE — Assessment & Plan Note (Signed)
He denies symptoms and has had flu vaccine. Will update chest x-ray and watch this issue over time.

## 2011-01-01 NOTE — Assessment & Plan Note (Signed)
He needs replacement CPAP supplies and mask. I think before the mask failed, compliance and control for good with CPAP and I think he is motivated to get started back again.

## 2011-01-28 ENCOUNTER — Other Ambulatory Visit: Payer: Self-pay | Admitting: Urology

## 2011-01-31 ENCOUNTER — Encounter (HOSPITAL_COMMUNITY): Payer: Self-pay | Admitting: Pharmacy Technician

## 2011-02-02 ENCOUNTER — Ambulatory Visit (HOSPITAL_COMMUNITY)
Admission: RE | Admit: 2011-02-02 | Discharge: 2011-02-02 | Disposition: A | Discharge status: Home / Self Care | Payer: Medicare Other | Source: Ambulatory Visit | Attending: Urology | Admitting: Urology

## 2011-02-02 ENCOUNTER — Encounter (HOSPITAL_COMMUNITY): Payer: Self-pay

## 2011-02-02 ENCOUNTER — Other Ambulatory Visit: Payer: Self-pay

## 2011-02-02 ENCOUNTER — Encounter (HOSPITAL_COMMUNITY)
Admission: RE | Admit: 2011-02-02 | Discharge: 2011-02-02 | Disposition: A | Discharge status: Home / Self Care | Payer: Medicare Other | Source: Ambulatory Visit | Attending: Urology | Admitting: Urology

## 2011-02-02 DIAGNOSIS — Z01818 Encounter for other preprocedural examination: Secondary | ICD-10-CM | POA: Insufficient documentation

## 2011-02-02 DIAGNOSIS — I498 Other specified cardiac arrhythmias: Secondary | ICD-10-CM | POA: Insufficient documentation

## 2011-02-02 DIAGNOSIS — I440 Atrioventricular block, first degree: Secondary | ICD-10-CM | POA: Insufficient documentation

## 2011-02-02 DIAGNOSIS — Z01810 Encounter for preprocedural cardiovascular examination: Secondary | ICD-10-CM | POA: Insufficient documentation

## 2011-02-02 DIAGNOSIS — Z87891 Personal history of nicotine dependence: Secondary | ICD-10-CM | POA: Insufficient documentation

## 2011-02-02 DIAGNOSIS — Z01812 Encounter for preprocedural laboratory examination: Secondary | ICD-10-CM | POA: Insufficient documentation

## 2011-02-02 HISTORY — DX: Pneumonia, unspecified organism: J18.9

## 2011-02-02 HISTORY — DX: Major depressive disorder, single episode, unspecified: F32.9

## 2011-02-02 HISTORY — DX: Depression, unspecified: F32.A

## 2011-02-02 LAB — BASIC METABOLIC PANEL
CO2: 27 mEq/L (ref 19–32)
Calcium: 9.7 mg/dL (ref 8.4–10.5)
GFR calc Af Amer: 74 mL/min — ABNORMAL LOW (ref >90)
GFR calc non Af Amer: 64 mL/min — ABNORMAL LOW (ref >90)
Sodium: 140 mEq/L (ref 135–145)

## 2011-02-02 LAB — CBC
MCH: 32.4 pg (ref 26.0–34.0)
Platelets: 166 10*3/uL (ref 150–400)
RBC: 4.78 MIL/uL (ref 4.22–5.81)
WBC: 5.8 10*3/uL (ref 4.0–10.5)

## 2011-02-02 LAB — SURGICAL PCR SCREEN: MRSA, PCR: NEGATIVE

## 2011-02-02 NOTE — Patient Instructions (Addendum)
20 Victor Osborne  02/02/2011   Your procedure is scheduled on:02-04-2011  Report to [facility] at 1200 PM.  Call this number if you have problems the morning of surgery: [phone]   Remember:BRING CPAP MASK ONLY   Do not eat food:After Midnight.  May have clear liquids:up to 6 Hours before arrival.CLEAR LIQUIDS MIDNIGHT UNTIL 0800AM, THE NOTHING  Clear liquids include soda, tea, black coffee, apple or grape juice, broth.  Take these medicines the morning of surgery with A SIP OF WATER TEGRETOL, PROZAC, LEVOTHRYOID, STELAZINE:   Do not wear jewelry,   Do not wear lotions, powders, or  COLOGNES,  do not may wear deodorant. .  Do not bring valuables to the hospital.  Contacts, dentures or bridgework may not be worn into surgery.  Leave suitcase in the car. After surgery it may be brought to your room.  For patients admitted to the hospital, checkout time is 11:00 AM the day of discharge.   Patients discharged the day of surgery will not be allowed to drive home.    Special Instructions: CHG Shower Use Special Wash: 1/2 bottle night before surgery and 1/2 bottle morning of surgery.   Please read over the following fact sheets that you were given: MRSA Information [name], rn wl pre op nurse phone number [phone]

## 2011-02-04 ENCOUNTER — Encounter (HOSPITAL_COMMUNITY)
Admission: RE | Disposition: A | Discharge status: Home / Self Care | Payer: Self-pay | Source: Ambulatory Visit | Attending: Urology

## 2011-02-04 ENCOUNTER — Encounter (HOSPITAL_COMMUNITY): Payer: Self-pay | Admitting: Anesthesiology

## 2011-02-04 ENCOUNTER — Ambulatory Visit (HOSPITAL_COMMUNITY): Payer: Medicare Other | Admitting: Anesthesiology

## 2011-02-04 ENCOUNTER — Other Ambulatory Visit: Payer: Self-pay | Admitting: Urology

## 2011-02-04 ENCOUNTER — Ambulatory Visit (HOSPITAL_COMMUNITY)
Admission: RE | Admit: 2011-02-04 | Discharge: 2011-02-05 | Disposition: A | Discharge status: Home / Self Care | Payer: Medicare Other | Source: Ambulatory Visit | Attending: Urology | Admitting: Urology

## 2011-02-04 ENCOUNTER — Encounter (HOSPITAL_COMMUNITY): Payer: Self-pay | Admitting: *Deleted

## 2011-02-04 DIAGNOSIS — N401 Enlarged prostate with lower urinary tract symptoms: Secondary | ICD-10-CM | POA: Insufficient documentation

## 2011-02-04 DIAGNOSIS — G4730 Sleep apnea, unspecified: Secondary | ICD-10-CM | POA: Insufficient documentation

## 2011-02-04 DIAGNOSIS — E7800 Pure hypercholesterolemia, unspecified: Secondary | ICD-10-CM | POA: Insufficient documentation

## 2011-02-04 DIAGNOSIS — Z85820 Personal history of malignant melanoma of skin: Secondary | ICD-10-CM | POA: Insufficient documentation

## 2011-02-04 DIAGNOSIS — R32 Unspecified urinary incontinence: Secondary | ICD-10-CM | POA: Insufficient documentation

## 2011-02-04 DIAGNOSIS — N138 Other obstructive and reflux uropathy: Secondary | ICD-10-CM | POA: Insufficient documentation

## 2011-02-04 DIAGNOSIS — E039 Hypothyroidism, unspecified: Secondary | ICD-10-CM | POA: Insufficient documentation

## 2011-02-04 DIAGNOSIS — Z79899 Other long term (current) drug therapy: Secondary | ICD-10-CM | POA: Insufficient documentation

## 2011-02-04 HISTORY — PX: TRANSURETHRAL RESECTION OF PROSTATE: SHX73

## 2011-02-04 LAB — BASIC METABOLIC PANEL
BUN: 17 mg/dL (ref 6–23)
CO2: 24 mEq/L (ref 19–32)
Calcium: 8.9 mg/dL (ref 8.4–10.5)
Creatinine, Ser: 1.15 mg/dL (ref 0.50–1.35)
Glucose, Bld: 80 mg/dL (ref 70–99)
Sodium: 143 mEq/L (ref 135–145)

## 2011-02-04 SURGERY — TURP (TRANSURETHRAL RESECTION OF PROSTATE)
Anesthesia: General | Site: Prostate | Wound class: Clean Contaminated

## 2011-02-04 MED ORDER — EPHEDRINE SULFATE 50 MG/ML IJ SOLN
INTRAMUSCULAR | Status: DC | PRN
  Administered 2011-02-04 (×2): 10 mg via INTRAVENOUS

## 2011-02-04 MED ORDER — LACTATED RINGERS IV SOLN
INTRAVENOUS | Status: DC | PRN
  Administered 2011-02-04 (×2): via INTRAVENOUS

## 2011-02-04 MED ORDER — MIDAZOLAM HCL 5 MG/5ML IJ SOLN
INTRAMUSCULAR | Status: DC | PRN
  Administered 2011-02-04: 2 mg via INTRAVENOUS

## 2011-02-04 MED ORDER — HYDROCODONE-ACETAMINOPHEN 5-300 MG PO TABS: 1.0000 | ORAL_TABLET | Freq: Four times a day (QID) | ORAL | Status: DC

## 2011-02-04 MED ORDER — STERILE WATER FOR IRRIGATION IR SOLN
Status: DC | PRN
  Administered 2011-02-04: 500 mL

## 2011-02-04 MED ORDER — FENTANYL CITRATE 0.05 MG/ML IJ SOLN
INTRAMUSCULAR | Status: DC | PRN
  Administered 2011-02-04: 50 ug via INTRAVENOUS

## 2011-02-04 MED ORDER — DIPHENHYDRAMINE HCL 50 MG/ML IJ SOLN: 12.5000 mg | Freq: Four times a day (QID) | INTRAMUSCULAR | Status: DC | PRN

## 2011-02-04 MED ORDER — SODIUM CHLORIDE 0.9 % IV SOLN
250.0000 mL | INTRAVENOUS | Status: DC
  Administered 2011-02-05: 250 mL via INTRAVENOUS

## 2011-02-04 MED ORDER — SODIUM CHLORIDE 0.9 % IR SOLN
3000.0000 mL | Status: DC
  Administered 2011-02-04: 3000 mL

## 2011-02-04 MED ORDER — SULFAMETHOXAZOLE-TMP DS 800-160 MG PO TABS: 1.0000 | ORAL_TABLET | Freq: Two times a day (BID) | ORAL | Status: AC

## 2011-02-04 MED ORDER — BACITRACIN-NEOMYCIN-POLYMYXIN 400-5-5000 EX OINT: 1.0000 "application " | TOPICAL_OINTMENT | Freq: Three times a day (TID) | CUTANEOUS | Status: DC | PRN

## 2011-02-04 MED ORDER — PROPOFOL 10 MG/ML IV EMUL
INTRAVENOUS | Status: DC | PRN
  Administered 2011-02-04: 200 mg via INTRAVENOUS

## 2011-02-04 MED ORDER — ONDANSETRON HCL 4 MG/2ML IJ SOLN
INTRAMUSCULAR | Status: DC | PRN
  Administered 2011-02-04: 4 mg via INTRAVENOUS

## 2011-02-04 MED ORDER — LIDOCAINE HCL 1 % IJ SOLN
INTRAMUSCULAR | Status: DC | PRN
  Administered 2011-02-04: 50 mg via INTRADERMAL

## 2011-02-04 MED ORDER — 0.9 % SODIUM CHLORIDE (POUR BTL) OPTIME
TOPICAL | Status: DC | PRN
  Administered 2011-02-04: 1000 mL

## 2011-02-04 MED ORDER — SODIUM CHLORIDE 0.45 % IV SOLN
INTRAVENOUS | Status: DC
  Administered 2011-02-04: 21:00:00 via INTRAVENOUS

## 2011-02-04 MED ORDER — SODIUM CHLORIDE 0.9 % IJ SOLN: 3.0000 mL | Freq: Two times a day (BID) | INTRAMUSCULAR | Status: DC

## 2011-02-04 MED ORDER — GLYCINE 1.5 % IR SOLN
Status: DC | PRN
  Administered 2011-02-04: 18000 mL

## 2011-02-04 MED ORDER — SODIUM CHLORIDE 0.9 % IJ SOLN: 3.0000 mL | INTRAMUSCULAR | Status: DC | PRN

## 2011-02-04 MED ORDER — DOCUSATE SODIUM 100 MG PO CAPS
100.0000 mg | ORAL_CAPSULE | Freq: Two times a day (BID) | ORAL | Status: DC
  Administered 2011-02-04 – 2011-02-05 (×2): 100 mg via ORAL
  Filled 2011-02-04 (×3): qty 1

## 2011-02-04 MED ORDER — HYDROCODONE-ACETAMINOPHEN 5-325 MG PO TABS: 1.0000 | ORAL_TABLET | ORAL | Status: DC | PRN

## 2011-02-04 MED ORDER — PROMETHAZINE HCL 25 MG/ML IJ SOLN: 6.2500 mg | INTRAMUSCULAR | Status: DC | PRN

## 2011-02-04 MED ORDER — FENTANYL CITRATE 0.05 MG/ML IJ SOLN
25.0000 ug | INTRAMUSCULAR | Status: DC | PRN
  Administered 2011-02-04 (×2): 25 ug via INTRAVENOUS

## 2011-02-04 MED ORDER — ZOLPIDEM TARTRATE 5 MG PO TABS: 5.0000 mg | ORAL_TABLET | Freq: Every evening | ORAL | Status: DC | PRN

## 2011-02-04 MED ORDER — CIPROFLOXACIN IN D5W 400 MG/200ML IV SOLN
400.0000 mg | INTRAVENOUS | Status: AC
  Administered 2011-02-04: 400 mg via INTRAVENOUS

## 2011-02-04 MED ORDER — DIPHENHYDRAMINE HCL 12.5 MG/5ML PO ELIX: 12.5000 mg | ORAL_SOLUTION | Freq: Four times a day (QID) | ORAL | Status: DC | PRN

## 2011-02-04 MED ORDER — CIPROFLOXACIN IN D5W 400 MG/200ML IV SOLN
400.0000 mg | Freq: Two times a day (BID) | INTRAVENOUS | Status: DC
  Administered 2011-02-05: 400 mg via INTRAVENOUS
  Filled 2011-02-04 (×2): qty 200

## 2011-02-04 SURGICAL SUPPLY — 24 items
BAG URINE DRAINAGE (UROLOGICAL SUPPLIES) ×3 IMPLANT
BAG URO CATCHER STRL LF (DRAPE) ×3 IMPLANT
BLADE SURG 15 STRL LF DISP TIS (BLADE) IMPLANT
BLADE SURG 15 STRL SS (BLADE)
CATH FOLEY 3WAY 30CC 22FR (CATHETERS) IMPLANT
CATH HEMA 3WAY 30CC 22FR COUDE (CATHETERS) ×3 IMPLANT
CLOTH BEACON ORANGE TIMEOUT ST (SAFETY) ×3 IMPLANT
DRAPE CAMERA CLOSED 9X96 (DRAPES) ×3 IMPLANT
ELECT REM PT RETURN 9FT ADLT (ELECTROSURGICAL) ×3
ELECTRODE REM PT RTRN 9FT ADLT (ELECTROSURGICAL) ×2 IMPLANT
EVACUATOR MICROVAS BLADDER (UROLOGICAL SUPPLIES) IMPLANT
GLOVE BIOGEL M STRL SZ7.5 (GLOVE) ×3 IMPLANT
GLOVE SURG SS PI 6.5 STRL IVOR (GLOVE) ×6 IMPLANT
GLYCINE 1.5% IRRIG UROMATIC (IV SOLUTION) ×27 IMPLANT
GOWN STRL NON-REIN LRG LVL3 (GOWN DISPOSABLE) ×9 IMPLANT
HOLDER FOLEY CATH W/STRAP (MISCELLANEOUS) ×3 IMPLANT
KIT ASPIRATION TUBING (SET/KITS/TRAYS/PACK) ×3 IMPLANT
LOOPS RESECTOSCOPE DISP (ELECTROSURGICAL) ×3 IMPLANT
MANIFOLD NEPTUNE II (INSTRUMENTS) ×3 IMPLANT
PACK CYSTO (CUSTOM PROCEDURE TRAY) ×3 IMPLANT
SUT ETHILON 3 0 PS 1 (SUTURE) IMPLANT
SYR 30ML LL (SYRINGE) ×3 IMPLANT
SYRINGE IRR TOOMEY STRL 70CC (SYRINGE) ×3 IMPLANT
TUBING CONNECTING 10 (TUBING) ×3 IMPLANT

## 2011-02-04 NOTE — Transfer of Care (Signed)
Immediate Anesthesia Transfer of Care Note  Patient: Victor Osborne  Procedure(s) Performed:  TRANSURETHRAL RESECTION OF THE PROSTATE (TURP)  Patient Location: PACU  Anesthesia Type: General  Level of Consciousness: awake and alert   Airway & Oxygen Therapy: Patient Spontanous Breathing and Patient connected to face mask oxygen  Post-op Assessment: Report given to PACU RN and Post -op Vital signs reviewed and stable  Post vital signs: Reviewed and stable Filed Vitals:   02/04/11 1135  BP: 138/82  Pulse: 61  Temp: 36.2 C  Resp: 20    Complications: No apparent anesthesia complications

## 2011-02-04 NOTE — H&P (Signed)
History of Present Illness  Victor Osborne is a 78 year old with the following urologic history:  1) Right ureteropelvic junction obstruction: He is s/p a right robotic pyeloplasty on 09/29/08. He required an extensive reduction pyeloplasty and recovered without complications. He did have a question of obstruction on his followup renogram prompting endoscopic evaluation in March 2011 which revealed a widely patent UPJ.  2) Prostate cancer screening: Last PSA: 3.73 (Oct 2011)  3) BPH/LUTS: His most bothersome symptoms has been urinary frequency. His baseline PVRs are around 250 cc.  Current treatment: Tamsulosin 0.8 mg, Finasteride 5 mg  Interval history:  He follows up today after his recent urodynamic evaluation and is scheduled to undergo cystoscopy as well. He states that his symptoms were slightly improved in late December around the time of urodynamics and he did not have as much incontinence. However, this has returned and continues to be as severe as his last visit. He does not feel that he is emptying his bladder well and has incontinence mostly associated with urgency and frequency every 30-60 minutes.   Past Medical History Problems  1. History of  Adult Sleep Apnea 780.57 2. History of  Anxiety (Symptom) 300.00 3. History of  Bipolar Disorder 296.00 4. History of  Depression 311 5. History of  Hypercholesterolemia 272.0 6. History of  Hypothyroidism 244.9 7. History of  Malignant Melanoma Of The Skin V10.82  Surgical History Problems  1. History of  Appendectomy 2. History of  Cystoscopy With Insertion Of Ureteral Stent Right 3. History of  Cystoscopy With Insertion Of Ureteral Stent Right 4. History of  Cystoscopy With Ureteroscopy Right 5. History of  Ear Surgery 6. History of  Foot Surgery 7. History of  Pyeloplasty Robotic-Assisted Right  Current Meds 1. Acidophilus CAPS; Therapy: (Recorded:23Jul2010) to 2. Aspirin 81 MG Oral Tablet; Therapy: (Recorded:23Jul2010)  to 3. CarBAMazepine 200 MG Oral Tablet; Therapy: (Recorded:23Jul2010) to 4. Clobetasol Propionate 0.05 % External Ointment; Therapy: 09Mar2011 to 5. Co Q-10 CAPS; Therapy: (Recorded:23Jul2010) to 6. CVS Stool Softener 100 MG Oral Capsule; Therapy: 15Sep2010 to 7. Finasteride 5 MG Oral Tablet; 1T;PO;QD;AS DIRECTED; Therapy: 26Oct2011 to  (Evaluate:17Oct2013)  Requested for: 17Oct2012; Last Rx:17Oct2012 8. Flaxseed Oil CAPS; Therapy: (Recorded:23Jul2010) to 9. FLUoxetine HCl 20 MG Oral Tablet; Therapy: (Recorded:23Jul2010) to 10. Garlic CAPS; Therapy: (Recorded:23Jul2010) to 11. Levothyroxine Sodium 125 MCG Oral Tablet; Therapy: (Recorded:23Jul2010) to 12. Multiple Vitamin TABS; Therapy: (Recorded:23Jul2010) to 13. Omega 3 CAPS; Therapy: (Recorded:23Jul2010) to 14. Pravastatin Sodium 40 MG Oral Tablet; Therapy: (Recorded:23Jul2010) to 15. Propranolol HCl 10 MG Oral Tablet; Therapy: 29Jul2011 to 16. Tamsulosin HCl 0.4 MG Oral Capsule; Take one (1) capsule daily; Therapy: 20Aug2010 to   (Evaluate:17Oct2013)  Requested for: 17Oct2012; Last Rx:17Oct2012 17. Trifluoperazine HCl 2 MG Oral Tablet; Therapy: (Recorded:23Jul2010) to 18. Vitamin C TABS; Therapy: (Recorded:23Jul2010) to 19. Vitamin D CAPS; Therapy: (Recorded:23Jul2010) to  Allergies Medication  1. No Known Drug Allergies  Family History Problems  1. Maternal history of  Ischemic Stroke V17.1 2. Paternal history of  Lung Cancer V16.1 3. Paternal history of  Prostate Cancer V16.42  Social History Problems  1. Marital History - Currently Married 2. Occupation: retired 3. Tobacco Use V15.82 2pks a day for 25 yrs; quit 23 yrs ago Denied  4. History of  Alcohol Use  Review of Systems Constitutional, skin, eye, otolaryngeal, hematologic/lymphatic, cardiovascular, pulmonary, endocrine, musculoskeletal, gastrointestinal, neurological and psychiatric system(s) were reviewed and pertinent findings if present are noted.   Cardiovascular: no chest pain.  Respiratory: no  shortness of breath.    Vitals Vital Signs [Data Includes: Last 1 Day]  10Jan2013 04:19PM  BMI Calculated: 27.4 BSA Calculated: 2.33 Height: 6 ft 4 in Weight: 225 lb  Blood Pressure: 138 / 86 Temperature: 98.1 F Heart Rate: 62  Physical Exam Constitutional: Well nourished and well developed . No acute distress.  ENT:. The ears and nose are normal in appearance.  Neck: The appearance of the neck is normal and no neck mass is present.  Pulmonary: No respiratory distress, normal respiratory rhythm and effort and clear bilateral breath sounds.  Cardiovascular: Heart rate and rhythm are normal . No peripheral edema.    Results/Data Urine [Data Includes: Last 1 Day]   10Jan2013  COLOR YELLOW   APPEARANCE CLEAR   SPECIFIC GRAVITY 1.010   pH 5.5   GLUCOSE NEG mg/dL  BILIRUBIN NEG   KETONE NEG mg/dL  BLOOD NEG   PROTEIN NEG mg/dL  UROBILINOGEN 0.2 mg/dL  NITRITE NEG   LEUKOCYTE ESTERASE SMALL   SQUAMOUS EPITHELIAL/HPF NONE SEEN   WBC 7-10 WBC/hpf  RBC NONE SEEN RBC/hpf  BACTERIA FEW   CRYSTALS NONE SEEN   CASTS NONE SEEN     I reviewed his urodynamic evaluation which is dictated in a separate note dated 01/07/11  PVR today is 502 cc.   Procedure  Procedure: Cystoscopy  Indication: Lower Urinary Tract Symptoms.  Informed Consent: Risks, benefits, and potential adverse events were discussed and informed consent was obtained from the patient.  Prep: The patient was prepped with betadine.  Anesthesia:. Local anesthesia was administered intraurethrally with 2% lidocaine jelly.  Antibiotic prophylaxis: Ciprofloxacin.  Procedure Note:  Urethral meatus:. No abnormalities.  Anterior urethra: No abnormalities.  Prostatic urethra:. The lateral prostatic lobes were enlarged. Particularly, the left lateral lobe is significantly larger compared to the right and visually obstructing the prostatic urethra. The prostatic urethra  measures approximately 3 cm.  Bladder: Visulization was clear. The ureteral orifices were in the normal anatomic position bilaterally and had clear efflux of urine. A systematic survey of the bladder demonstrated no bladder tumors or stones. The mucosa was smooth without abnormalities. The patient tolerated the procedure well.  Complications: None.    Assessment Assessed  1. Benign Prostatic Hypertrophy With Urinary Obstruction 600.01 2. Urinary Incontinence 788.30  Plan Benign Prostatic Hypertrophy With Urinary Obstruction (600.01)  1. Follow-up Schedule Surgery Office  Follow-up  Requested for: 10Jan2013 Health Maintenance (V70.0)  2. UA With REFLEX  Done: 10Jan2013 03:51PM  Discussion/Summary 1. BPH/LUTS/incontinence: After reviewing his urodynamics and proceeding with cystoscopy today, my impression is that Mr. Lavell Islam may be experiencing some overflow incontinence and also urge incontinence associated with unstable bladder contractions which occur at high bladder volumes. Well his urodynamics did not necessarily suggest obstruction, he appeared to void much better that day compared to his typical voiding over the past couple months and particularly at his last few visits where his postvoid residuals have been much higher than they were the day of his urodynamics. I offered him the option of proceeding with intermittent catheterization as one option to reduce his PVRs versus the option of a transurethral resection of the prostate to help improve his bladder emptying and potentially avoid higher bladder volumes and unstable bladder contractions that occur at this volumes. I told him that the success rate would be approximately 75%. We also reviewed the potential risks with TURP including but not limited to bleeding, infection, incontinence, erectile dysfunction, bladder neck contracture, and need for further procedures or  possibly prolonged catheterization, et Karie Soda. He is agreeable to proceed and  this will be performed in the near future.  2. Right UPJ obstruction status post repair: He will be due for his next renal scan in the fall of 2012.  Cc: Dr. Marjory Lies   Signatures Electronically signed by : Heloise Purpura, M.D.; Jan 27 2011  6:07PM

## 2011-02-04 NOTE — Op Note (Signed)
Preoperative diagnosis: 1. Bladder outlet obstruction secondary to BPH  Postoperative diagnosis:  1. Bladder outlet obstruction secondary to BPH  Procedure:  1. Cystoscopy 2. Transurethral resection of the prostate  Surgeon: Moody Bruins. M.D.  Anesthesia: General  Complications: None  EBL: Minimal  Specimens: 1. Prostate Chips   Disposition of specimens: Pathology  Indication: Victor Osborne is a patient with bladder outlet obstruction secondary to benign prostatic hyperplasia. After reviewing the management options for treatment, he elected to proceed with the above surgical procedure(s). We have discussed the potential benefits and risks of the procedure, side effects of the proposed treatment, the likelihood of the patient achieving the goals of the procedure, and any potential problems that might occur during the procedure or recuperation. Informed consent has been obtained.  Description of procedure:  The patient was taken to the operating room and general anesthesia was induced.  The patient was placed in the dorsal lithotomy position, prepped and draped in the usual sterile fashion, and preoperative antibiotics were administered. A preoperative time-out was performed.   Cystourethroscopy was performed.  The patient's urethra was examined and demonstrated bilobar prostatic hypertrophy.   The bladder was then systematically examined in its entirety. There was no evidence of any bladder tumors, stones, or other mucosal pathology.  The ureteral orifices were identified and marked so as to be avoided during the procedure.  The prostate adenoma was then resected utilizing loop cautery resection with the monopolar/bipolar cutting loop.  The prostate adenoma from the bladder neck back to the verumontanum was resected beginning at the six o'clock position and then extended to include the right and left lobes of the prostate and anterior prostate. Care was taken not to  resect distal to the verumontanum.  Hemostasis was then achieved with the cautery and the bladder was emptied and reinspected with no significant bleeding noted at the end of the procedure.    A 3 way catheter was then placed into the bladder and placed on continuous bladder irrigation.  The patient appeared to tolerate the procedure well and without complications.  The patient was able to be awakened and transferred to the recovery unit in satisfactory condition.

## 2011-02-04 NOTE — Interval H&P Note (Signed)
History and Physical Interval Note:  02/04/2011 2:28 PM  Victor Osborne  has presented today for surgery, with the diagnosis of Benign Prostatic Hyperplasia  The various methods of treatment have been discussed with the patient and family. After consideration of risks, benefits and other options for treatment, the patient has consented to  Procedure(s): CYSTOSCOPY TRANSURETHRAL RESECTION OF THE PROSTATE (TURP) as a surgical intervention .  The patients' history has been reviewed, patient examined, no change in status, stable for surgery.  I have reviewed the patients' chart and labs.  Questions were answered to the patient's satisfaction.     Urvi Imes,LES

## 2011-02-04 NOTE — Anesthesia Postprocedure Evaluation (Signed)
  Anesthesia Post-op Note  Patient: Victor Osborne  Procedure(s) Performed:  TRANSURETHRAL RESECTION OF THE PROSTATE (TURP)  Patient Location: PACU  Anesthesia Type: General  Level of Consciousness: awake and alert   Airway and Oxygen Therapy: Patient Spontanous Breathing  Post-op Pain: mild  Post-op Assessment: Post-op Vital signs reviewed, Patient's Cardiovascular Status Stable, Respiratory Function Stable, Patent Airway and No signs of Nausea or vomiting  Post-op Vital Signs: stable  Complications: No apparent anesthesia complications

## 2011-02-04 NOTE — Anesthesia Preprocedure Evaluation (Addendum)
Anesthesia Evaluation  Patient identified by MRN, date of birth, ID band Patient awake    Reviewed: Allergy & Precautions, H&P , NPO status , Patient's Chart, lab work & pertinent test results  Airway Mallampati: II TM Distance: <3 FB Neck ROM: Limited    Dental No notable dental hx.    Pulmonary neg pulmonary ROS, sleep apnea , COPD clear to auscultation  Pulmonary exam normal       Cardiovascular hypertension, neg cardio ROS Regular Normal    Neuro/Psych Negative Neurological ROS  Negative Psych ROS   GI/Hepatic negative GI ROS, Neg liver ROS,   Endo/Other  Negative Endocrine ROSDiabetes mellitus-  Renal/GU negative Renal ROS  Genitourinary negative   Musculoskeletal negative musculoskeletal ROS (+)   Abdominal   Peds negative pediatric ROS (+)  Hematology negative hematology ROS (+)   Anesthesia Other Findings   Reproductive/Obstetrics negative OB ROS                          Anesthesia Physical Anesthesia Plan  ASA: III  Anesthesia Plan: General   Post-op Pain Management:    Induction: Intravenous  Airway Management Planned: LMA  Additional Equipment:   Intra-op Plan:   Post-operative Plan:   Informed Consent: I have reviewed the patients History and Physical, chart, labs and discussed the procedure including the risks, benefits and alternatives for the proposed anesthesia with the patient or authorized representative who has indicated his/her understanding and acceptance.   Dental advisory given  Plan Discussed with: CRNA  Anesthesia Plan Comments:         Anesthesia Quick Evaluation

## 2011-02-05 MED ORDER — CARBAMAZEPINE 200 MG PO TABS
200.0000 mg | ORAL_TABLET | Freq: Three times a day (TID) | ORAL | Status: AC
  Administered 2011-02-05: 200 mg via ORAL
  Filled 2011-02-05: qty 1

## 2011-02-05 MED ORDER — DSS 100 MG PO CAPS: 100.0000 mg | ORAL_CAPSULE | Freq: Two times a day (BID) | ORAL | Status: AC

## 2011-02-05 MED ORDER — FLUOXETINE HCL 20 MG PO CAPS
20.0000 mg | ORAL_CAPSULE | Freq: Two times a day (BID) | ORAL | Status: AC
  Administered 2011-02-05: 20 mg via ORAL
  Filled 2011-02-05: qty 1

## 2011-02-05 NOTE — Discharge Summary (Signed)
  Date of admission: 02/04/2011  Date of discharge: 02/05/2011  Admission diagnosis: BPH with bladder outlet obstruction  Discharge diagnosis: Same  History and Physical: For full details, please see admission history and physical. Briefly, Victor Osborne is a 78 y.o. year old patient with bladder outlet obstruction and LUTS thought to be related to BPH.   Hospital Course: [name] is a gentleman with worsening LUTS thought to be due to BPH.  He underwent a TURP on 02/04/11.  He was maintained on CBI overnight and was able to undergo a voiding trial on the morning of POD#1.  He passed his voiding trial and was able to be discharged home.  Laboratory values:  Basename 02/02/11 0900  HGB 15.5  HCT 45.9    Basename 02/04/11 2022 02/02/11 0900  CREATININE 1.15 1.09    Disposition: Home  Discharge instruction: The patient was instructed to be ambulatory but told to refrain from heavy lifting, strenuous activity, or driving.   Discharge medications:  Medication List  As of 02/05/2011  8:06 AM   START taking these medications         DSS 100 MG Caps   Take 100 mg by mouth 2 (two) times daily.      Hydrocodone-Acetaminophen 5-300 MG Tabs   Take 1-2 tablets by mouth every 6 (six) hours.         CONTINUE taking these medications         alclomethasone 0.05 % ointment   Commonly known as: ACLOVATE      carbamazepine 200 MG tablet   Commonly known as: TEGRETOL      cholecalciferol 1000 UNITS tablet   Commonly known as: VITAMIN D      desoximetasone 0.25 % cream   Commonly known as: TOPICORT      finasteride 5 MG tablet   Commonly known as: PROSCAR      Flax Seed Oil 1300 MG Caps      FLUoxetine 20 MG tablet   Commonly known as: PROZAC      Garlic 1000 MG Caps      GLUCOSAMINE MSM COMPLEX PO      HAWTHORN BERRY PO      LEVOTHROID 125 MCG tablet   Generic drug: levothyroxine      mupirocin ointment 2 %   Commonly known as: BACTROBAN      OMEGA-3 CF PO     pravastatin 40 MG tablet   Commonly known as: PRAVACHOL      sulfamethoxazole-trimethoprim 800-160 MG per tablet   Commonly known as: BACTRIM DS   Take 1 tablet by mouth 2 (two) times daily.      trifluoperazine 2 MG tablet   Commonly known as: STELAZINE      vitamin C 1000 MG tablet         STOP taking these medications         aspirin 81 MG tablet          Where to get your medications    These are the prescriptions that you need to pick up.   You may get these medications from any pharmacy.         DSS 100 MG Caps   Hydrocodone-Acetaminophen 5-300 MG Tabs   sulfamethoxazole-trimethoprim 800-160 MG per tablet            Followup:  Follow-up Information    Please follow up. (03/15/11 @10 :30)

## 2011-02-05 NOTE — Progress Notes (Signed)
Bladder scan performed as ordered.  Between 340 - shown.  MD at bedside and aware.  Discharge orders received and follow-up instructions repeated to pt with RN and MD at bedside.  Ardyth Gal, RN 02/05/2011

## 2011-02-05 NOTE — Progress Notes (Signed)
Patient ID: Victor Osborne, male   DOB: 1933-01-11, 78 y.o.   MRN: [id number]  1 Day Post-Op Subjective: Doing well.  No complaints.  Urine light pink on minimal CBI.  Objective: Vital signs in last 24 hours: Temp:  [97.1 F (36.2 C)-97.9 F (36.6 C)] 97.9 F (36.6 C) (01/19 0504) Pulse Rate:  [52-122] 56  (01/19 0504) Resp:  [9-20] 14  (01/19 0504) BP: (103-168)/(63-82) 103/64 mmHg (01/19 0504) SpO2:  [97 %-100 %] 97 % (01/19 0504) Weight:  [102.9 kg (226 lb 13.7 oz)] 102.9 kg (226 lb 13.7 oz) (01/18 1957)  Intake/Output from previous day: 01/18 0701 - 01/19 0700 In: 6780 [P.O.:480; I.V.:1500] Out: 78295 [Urine:13000] Intake/Output this shift:    Physical Exam:  General: Alert and oriented Abdomen: Soft, ND GU: Urine light pink on minimal CBI   Lab Results:  Basename 02/02/11 0900  HGB 15.5  HCT 45.9   BMET  Basename 02/04/11 2022 02/02/11 0900  NA 143 140  K 4.2 4.2  CL 109 104  CO2 24 27  GLUCOSE 80 87  BUN 17 18  CREATININE 1.15 1.09  CALCIUM 8.9 9.7     Studies/Results: No results found.  Assessment/Plan: Hold CBI and D/C catheter if urine remains light pink or clear. D/C home if able to undergo and pass voiding trial.   LOS: 1 day   Victor Osborne,LES 02/05/2011, 8:03 AM

## 2011-02-07 ENCOUNTER — Encounter (HOSPITAL_COMMUNITY): Payer: Self-pay | Admitting: Urology

## 2011-05-19 ENCOUNTER — Ambulatory Visit: Payer: Medicare Other | Admitting: Internal Medicine

## 2011-06-14 ENCOUNTER — Ambulatory Visit (INDEPENDENT_AMBULATORY_CARE_PROVIDER_SITE_OTHER): Payer: Medicare Other | Admitting: Internal Medicine

## 2011-06-14 ENCOUNTER — Encounter: Payer: Self-pay | Admitting: Internal Medicine

## 2011-06-14 VITALS — BP 110/66 | HR 56 | Ht 76.0 in | Wt 233.4 lb

## 2011-06-14 DIAGNOSIS — G4733 Obstructive sleep apnea (adult) (pediatric): Secondary | ICD-10-CM

## 2011-06-14 DIAGNOSIS — J449 Chronic obstructive pulmonary disease, unspecified: Secondary | ICD-10-CM

## 2011-06-14 MED ORDER — AEROCHAMBER MV MISC: Status: DC

## 2011-06-14 NOTE — Patient Instructions (Signed)
Sample Dulera 200     2 puffs, then rinse mouth twice daily  Script for aerochamber spacer  Order schedule PFT and 6 minute walk test    Dx COPD

## 2011-06-14 NOTE — Progress Notes (Signed)
12/28/10- 78 yoM former smoker, followed for COPD, OSA, complicated by BiPolar, HBP, hx melanoma, DM LOV-12/19/2007 Has had flu vaccine. He had changed his CPAP company to [employer], but complains that his mask is old and worn out he can't wear it. Therefore he has not used CPAP in some months. His wife tells him that without CPAP he snores loudly and talks in his sleep. He recognizes he is not sleeping as well. His COPD has been stable with insignificant cough and wheeze. He denies noticing shortness of breath while walking. He has no inhaled medications. CXR-04/09/2009-COPD, no active process.  06/14/11- 71 yoM former smoker, followed for COPD, OSA, complicated by BiPolar, HBP, hx melanoma, DM Cough from time to time: CAT score of 13; Was given QVAR from PCP and can tell slight difference-? aerochamber to use with it. Says he has had productive cough for about a year. Some increased shortness of breath making it hard to say. Gradually worse since last office visit. Dr. Doristine Counter gave Qvar which may help some. Continues CPAP for sleep apnea with good compliance and control. Not Parkinson, but may be related to his bipolar medicines. CXR 02/02/11-  IMPRESSION:  Generalized hyperinflation configuration consistent with COPD. No  acute superimposed abnormality.  Original Report Authenticated By: Crawford Givens, M.D.   ROS-see HPI Constitutional:   No-   weight loss, night sweats, fevers, chills, fatigue, lassitude. HEENT:   No-  headaches, difficulty swallowing, tooth/dental problems, sore throat,       No-  sneezing, itching, ear ache, nasal congestion, post nasal drip,  CV:  No-   chest pain, orthopnea, PND, swelling in lower extremities, anasarca, dizziness, palpitations Resp: +  shortness of breath with exertion or at rest.              +  productive cough,  No non-productive cough,  No- coughing up of blood.              No-   change in color of mucus.  No- wheezing.   Skin: No-   rash or  lesions. GI:  No-   heartburn, indigestion, abdominal pain, nausea, vomiting,  GU:  MS:  + joint pain or swelling.  Neuro-     tremor Psych:  No- change in mood or affect. No depression or anxiety.  No memory loss.  OBJ General- Alert, Oriented, Affect-appropriate, Distress- none acute, looks well Skin- rash-none, lesions- none, excoriation- none Lymphadenopathy- none Head- atraumatic            Eyes- Gross vision intact, PERRLA, conjunctivae clear secretions            Ears- Hearing, canals-normal            Nose- Clear, no-Septal dev, mucus, polyps, erosion, perforation             Throat- Mallampati II , mucosa clear , drainage- none, tonsils- atrophic Neck- flexible , trachea midline, no stridor , thyroid nl, carotid no bruit Chest - symmetrical excursion , unlabored           Heart/CV- RRR , no murmur , no gallop  , no rub, nl s1 s2                           - JVD- none , edema- none, stasis changes- none, varices- none           Lung- slight rattle right base, unlabored, wheeze- none, cough- none , dullness-none,  rub- none           Chest wall-  Abd- tender-no, distended-no, bowel sounds-present, HSM- no Br/ Gen/ Rectal- Not done, not indicated Extrem- cyanosis- none, clubbing, none, atrophy- none, strength- nl, using a cane.  Neuro- resting tremor of hands

## 2011-06-18 ENCOUNTER — Emergency Department (HOSPITAL_BASED_OUTPATIENT_CLINIC_OR_DEPARTMENT_OTHER): Payer: Medicare Other

## 2011-06-18 ENCOUNTER — Emergency Department (HOSPITAL_BASED_OUTPATIENT_CLINIC_OR_DEPARTMENT_OTHER)
Admission: EM | Admit: 2011-06-18 | Discharge: 2011-06-18 | Disposition: A | Discharge status: Home / Self Care | Payer: Medicare Other | Attending: Emergency Medicine | Admitting: Emergency Medicine

## 2011-06-18 ENCOUNTER — Encounter (HOSPITAL_BASED_OUTPATIENT_CLINIC_OR_DEPARTMENT_OTHER): Payer: Self-pay | Admitting: *Deleted

## 2011-06-18 DIAGNOSIS — J449 Chronic obstructive pulmonary disease, unspecified: Secondary | ICD-10-CM | POA: Insufficient documentation

## 2011-06-18 DIAGNOSIS — W010XXA Fall on same level from slipping, tripping and stumbling without subsequent striking against object, initial encounter: Secondary | ICD-10-CM | POA: Insufficient documentation

## 2011-06-18 DIAGNOSIS — W19XXXA Unspecified fall, initial encounter: Secondary | ICD-10-CM

## 2011-06-18 DIAGNOSIS — I10 Essential (primary) hypertension: Secondary | ICD-10-CM | POA: Insufficient documentation

## 2011-06-18 DIAGNOSIS — Z79899 Other long term (current) drug therapy: Secondary | ICD-10-CM | POA: Insufficient documentation

## 2011-06-18 DIAGNOSIS — J4489 Other specified chronic obstructive pulmonary disease: Secondary | ICD-10-CM | POA: Insufficient documentation

## 2011-06-18 DIAGNOSIS — M4802 Spinal stenosis, cervical region: Secondary | ICD-10-CM | POA: Insufficient documentation

## 2011-06-18 DIAGNOSIS — R259 Unspecified abnormal involuntary movements: Secondary | ICD-10-CM | POA: Insufficient documentation

## 2011-06-18 DIAGNOSIS — S0003XA Contusion of scalp, initial encounter: Secondary | ICD-10-CM | POA: Insufficient documentation

## 2011-06-18 DIAGNOSIS — S022XXA Fracture of nasal bones, initial encounter for closed fracture: Secondary | ICD-10-CM | POA: Insufficient documentation

## 2011-06-18 DIAGNOSIS — Y92009 Unspecified place in unspecified non-institutional (private) residence as the place of occurrence of the external cause: Secondary | ICD-10-CM | POA: Insufficient documentation

## 2011-06-18 HISTORY — DX: Disorder of thyroid, unspecified: E07.9

## 2011-06-18 HISTORY — DX: Malignant melanoma of skin, unspecified: C43.9

## 2011-06-18 MED ORDER — TETANUS-DIPHTH-ACELL PERTUSSIS 5-2.5-18.5 LF-MCG/0.5 IM SUSP
0.5000 mL | Freq: Once | INTRAMUSCULAR | Status: AC
  Administered 2011-06-18: 0.5 mL via INTRAMUSCULAR
  Filled 2011-06-18: qty 0.5

## 2011-06-18 NOTE — Assessment & Plan Note (Signed)
Good compliance and control on 9 CWP. No changes needed.

## 2011-06-18 NOTE — Assessment & Plan Note (Signed)
Plan-stopped Qvar as not helpful enough. Give sample Dulera 200 with discussion. Schedule PFT

## 2011-06-18 NOTE — ED Notes (Signed)
Pt reports he tripped over a hose and fell- bleeding noted from nose, controlled at present- denies LOC- also abrasions to left knee

## 2011-06-18 NOTE — ED Notes (Signed)
Suture cart is at the bedside and ready for the doctor to use. 

## 2011-06-18 NOTE — Discharge Instructions (Signed)
Safety and Preventing Falls Falls are a leading cause of injury and while they affect all age groups, falls have greater short-term and long-term impact on older age groups. However, falls should not be a part of life or aging. It is possible for individuals and their families to use preventive measures to significantly decrease the likelihood that anyone, especially an older adult, will fall. There are many simple measures which can make your home safer with respect to preventing falls. The following actions can help reduce falls among all members of your family and are especially important as you age, when your balance, lower limb strength, coordination, and eyesight may be declining. The use of preventive measures will help to reduce you and your family's risk of falls and serious medical consequences. OUTDOORS  Repair cracks and edges of walkways and driveways.   Remove high doorway thresholds and trim shrubbery on the main path into your home.   Ensure there is good outside lighting at main entrances and along main walkways.   Clear walkways of tools, rocks, debris, and clutter.   Check that handrails are not broken and are securely fastened. Both sides of steps should have handrails.   In the garage, be attentive to and clean up grease or oil spills on the cement. This can make the surface extremely slippery.   In winter, have leaves, snow, and ice cleared regularly.   Use sand or salt on walkways during winter months.  BATHROOM  Install grab bars by the toilet and in the tub and shower.   Use non-skid mats or decals in the tub or shower.   If unable to easily stand unsupported while showering, place a plastic non slip stool in the shower to sit on when needed.   Install night lights.   Keep floors dry and clean up all water on the floor immediately.   Remove soap buildup in tub or shower on a regular basis.   Secure bath mats with non-slip, double-sided rug tape.   Remove  tripping hazards from the floors.  BEDROOMS  Install night lights.   Do not use oversized bedding.   Make sure a bedside light is easy to reach.   Keep a telephone by your bedside.   Make sure that you can get in and out of your bed easily.   Have a firm chair, with side arms, to use for getting dressed.   Remove clutter from around closets.   Store clothing, bed coverings, and other household items where you can reach them comfortably.   Remove tripping hazards from the floor.  LIVING AREAS AND STAIRWAYS  Turn on lights to avoid having to walk through dark areas.   Keep lighting uniform in each room. Place brighter lightbulbs in darker areas, including stairways.   Replace lightbulbs that burn out in stairways immediately.   Arrange furniture to provide for clear pathways.   Keep furniture in the same place.   Eliminate or tape down electrical cables in high traffic areas.   Place handrails on both sides of stairways. Use handrails when going up or down stairs.   Most falls occur on the top or bottom 3 steps.   Fix any loose handrails. Make sure handrails on both sides of the stairways are as long as the stairs.   Remove all walkway obstacles.   Coil or tape electrical cords off to the side of walking areas and out of the way. If using many extension cords, have an electrician put  in a new wall outlet to reduce or eliminate them.   Make sure spills are cleaned up quickly and allow time for drying before walking on freshly cleaned floors.   Firmly attach carpet with non-skid or two-sided tape.   Keep frequently used items within easy reach.   Remove tripping hazards such as throw rugs and clutter in walkways. Never leave objects on stairs.   Get rid of throw rugs elsewhere if possible.   Eliminate uneven floor surfaces.   Make sure couches and chairs are easy to get into and out of.   Check carpeting to make sure it is firmly attached along stairs.   Make  repairs to worn or loose carpet promptly.   Select a carpet pattern that does not visually hide the edge of steps.   Avoid placing throw rugs or scatter rugs at the top or bottom of stairways, or properly secure with carpet tape to prevent slippage.   Have an electrician put in a light switch at the top and bottom of the stairs.   Get light switches that glow.   Avoid the following practices: hurrying, inattention, obscured vision, carrying large loads, and wearing slip-on shoes.   Be aware of all pets.  KITCHEN  Place items that are used frequently, such as dishes and food, within easy reach.   Keep handles on pots and pans toward the center of the stove. Use back burners when possible.   Make sure spills are cleaned up quickly and allow time for drying.   Avoid walking on wet floors.   Avoid hot utensils and knives.   Position shelves so they are not too high or low.   Place commonly used objects within easy reach.   If necessary, use a sturdy step stool with a grab bar when reaching.   Make sure electrical cables are out of the way.   Do not use floor polish or wax that makes floors slippery.  OTHER HOME FALL PREVENTION STRATEGIES  Wear low heel or rubber sole shoes that are supportive and fit well.   Wear closed toe shoes.   Know and watch for side effects of medications. Have your caregiver or pharmacist look at all your medicines, even over-the-counter medicines. Some medicines can make you sleepy or dizzy.   Exercise regularly. Exercise makes you stronger and improves your balance and coordination.   Limit use of alcohol.   Use eyeglasses if necessary and keep them clean. Have your vision checked every year.   Organize your household in a manner that minimizes the need to walk distances when hurried, or go up and down stairs unnecessarily. For example, have a phone placed on at least each floor of your home. If possible, have a phone beside each sitting or lying  area where you spend the most time at home. Keep emergency numbers posted at all phones.   Use non-skid floor wax.   When using a ladder, make sure:   The base is firm.   All ladder feet are on level ground.   The ladder is angled against the wall properly.   When climbing a ladder, face the ladder and hold the ladder rungs firmly.   If reaching, always keep your hips and body weight centered between the rails.   When using a stepladder, make sure it is fully opened and both spreaders are firmly locked.   Do not climb a closed stepladder.   Avoid climbing beyond the second step from the top of  a stepladder and the 4th rung from the top of an extension ladder.   Learn and use mobility aids as needed.   Change positions slowly. Arise slowly from sitting and lying positions. Sit on the edge of your bed before getting to your feet.   If you have a history of falls, ask someone to add color or contrast paint or tape to grab bars and handrails in your home.   If you have a history of falls, ask someone to place contrasting color strips on first and last steps.   Install an electrical emergency response system if you need one, and know how to use it.   If you have a medical or other condition that causes you to have limited physical strength, it is important that you reach out to family and friends for occasional help.  FOR CHILDREN:  If young children are in the home, use safety gates. At the top of stairs use screw-mounted gates; use pressure-mounted gates for the bottom of the stairs and doorways between rooms.   Young children should be taught to descend stairs on their stomachs, feet first, and later using the handrail.   Keep drawers fully closed to prevent them from being climbed on or pulled out entirely.   Move chairs, cribs, beds and other furniture away from windows.   Consider installing window guards on windows ground floor and up, unless they are emergency fire  exits. Make sure they have easy release mechanisms.   Consider installing special locks that only allow the window to be opened to a certain height.   Never rely on window screens to prevent falls.   Never leave babies alone on changing tables, beds or sofas. Use a changing table that has a restraining strap.   When a child can pull to a standing position, the crib mattress should be adjusted to its lowest position. There should be at least 26 inches between the top rails of the crib drop side and the mattress. Toys, bumper pads, and other objects that can be used as steps to climb out should be removed from the crib.   On bunk beds never allow a child under age 75 to sleep on the top bunk. For older children, if the upper bunk is not against a wall, use guard rails on both sides. No matter how old a child is, keep the guard rails in place on the top bunk since children roll during sleep. Do not permit horseplay on bunks.   Grass and soil surfaces beneath backyard playground equipment should be replaced with hardwood chips, shredded wood mulch, sand, pea gravel, rubber, crushed stone, or another safer material at depths of at least 9 to 12 inches.   When riding bikes or using skates, skateboards, skis, or snowboards, require children to wear helmets. Look for those that have stickers stating that they meet or exceed safety standards.   Vertical posts or pickets in deck, balcony, and stairway railings should be no more than 3 1/2 inches apart if a young baby will have access to the area. The space between horizontal rails or bars, and between the floor and the first horizontal rail or bar, should be no more than 3 1/2 inches.  Document Released: 12/24/2001 Document Revised: 12/23/2010 Document Reviewed: 10/23/2008 [facility] Patient Information 2012 [place], [place].  Facial Fracture A facial fracture is a break in one of the bones of your face. HOME CARE INSTRUCTIONS   Protect the injured part of  your face  until it is healed.   Do not participate in activities which give chance for re-injury until your doctor approves.   Gently wash and dry your face.   Wear head and facial protection while riding a bicycle, motorcycle, or snowmobile.  SEEK MEDICAL CARE IF:   An oral temperature above 102 F (38.9 C) develops.   You have severe headaches or notice changes in your vision.   You have new numbness or tingling in your face.   You develop nausea (feeling sick to your stomach), vomiting or a stiff neck.  SEEK IMMEDIATE MEDICAL CARE IF:   You develop difficulty seeing or experience double vision.   You become dizzy, lightheaded, or faint.   You develop trouble speaking, breathing, or swallowing.   You have a watery discharge from your nose or ear.  MAKE SURE YOU:   Understand these instructions.   Will watch your condition.   Will get help right away if you are not doing well or get worse.  Document Released: 01/03/2005 Document Revised: 12/23/2010 Document Reviewed: 08/23/2007 [facility] Patient Information 2012 [place], [place].  Contusion A contusion is a deep bruise. Contusions are the result of an injury that caused bleeding under the skin. The contusion may turn blue, purple, or yellow. Minor injuries will give you a painless contusion, but more severe contusions may stay painful and swollen for a few weeks.  CAUSES  A contusion is usually caused by a blow, trauma, or direct force to an area of the body. SYMPTOMS   Swelling and redness of the injured area.   Bruising of the injured area.   Tenderness and soreness of the injured area.   Pain.  DIAGNOSIS  The diagnosis can be made by taking a history and physical exam. An X-ray, CT scan, or MRI may be needed to determine if there were any associated injuries, such as fractures. TREATMENT  Specific treatment will depend on what area of the body was injured. In general, the best treatment for a contusion is  resting, icing, elevating, and applying cold compresses to the injured area. Over-the-counter medicines may also be recommended for pain control. Ask your caregiver what the best treatment is for your contusion. HOME CARE INSTRUCTIONS   Put ice on the injured area.   Put ice in a plastic bag.   Place a towel between your skin and the bag.   Leave the ice on for 15 to 20 minutes, 3 to 4 times a day.   Only take over-the-counter or prescription medicines for pain, discomfort, or fever as directed by your caregiver. Your caregiver may recommend avoiding anti-inflammatory medicines (aspirin, ibuprofen, and naproxen) for 48 hours because these medicines may increase bruising.   Rest the injured area.   If possible, elevate the injured area to reduce swelling.  SEEK IMMEDIATE MEDICAL CARE IF:   You have increased bruising or swelling.   You have pain that is getting worse.   Your swelling or pain is not relieved with medicines.  MAKE SURE YOU:   Understand these instructions.   Will watch your condition.   Will get help right away if you are not doing well or get worse.  Document Released: 10/13/2004 Document Revised: 12/23/2010 Document Reviewed: 11/08/2010 [facility] Patient Information 2012 [place], [place].  Tetanus, Diphtheria (Td) or Tetanus, Diphtheria, Pertussis (Tdap) Vaccine What You Need to Know WHY GET VACCINATED? Tetanus, diphtheria and pertussis can be very serious diseases. TETANUS (Lockjaw) causes painful muscle spasms and stiffness, usually all over the  body.  Tetanus can lead to tightening of muscles in the head and neck so the victim cannot open his mouth or swallow, or sometimes even breathe. Tetanus kills about 1 out of 5 people who are infected.  DIPHTHERIA can cause a thick membrane to cover the back of the throat.  Diphtheria can lead to breathing problems, paralysis, heart failure, and even death.  PERTUSSIS (Whooping Cough) causes severe coughing spells  which can lead to difficulty breathing, vomiting, and disturbed sleep.  Pertussis can lead to weight loss, incontinence, rib fractures and passing out from violent coughing. Up to 2 in 100 adolescents and 5 in 100 adults with pertussis are hospitalized or have complications, including pneumonia and death.  These 3 diseases are all caused by bacteria. Diphtheria and pertussis are spread from person to person. Tetanus enters the body through cuts, scratches, or wounds. The [place] saw as many as 200,000 cases a year of diphtheria and pertussis before vaccines were available, and hundreds of cases of tetanus. Since then, tetanus and diphtheria cases have dropped by about 99% and pertussis cases by about 92%. Children 54 years of age and younger get DTaP vaccine to protect them from these three diseases. But older children, adolescents, and adults need protection too. VACCINES FOR ADOLESCENTS AND ADULTS: TD AND TDAP Two vaccines are available to protect people 2 years of age and older from these diseases:  Td vaccine has been used for many years. It protects against tetanus and diphtheria.   Tdap vaccine was licensed in 2005. It is the first vaccine for adolescents and adults that protects against pertussis as well as tetanus and diphtheria.  A Td booster dose is recommended every 10 years. Tdap is given only once. WHICH VACCINE, AND WHEN? Ages 7 through 18 years  A dose of Tdap is recommended at age 25 or 77. This dose could be given as early as age 73 for children who missed one or more childhood doses of DTaP.   Children and adolescents who did not get a complete series of DTaP shots by age 51 should complete the series using a combination of Td and Tdap.  Age 107 years and Older  All adults should get a booster dose of Td every 10 years. Adults under 65 who have never gotten Tdap should get a dose of Tdap as their next booster dose. Adults 65 and older may get one booster dose of Tdap.    Adults (including women who may become pregnant and adults 57 and older) who expect to have close contact with a baby younger than 2 months of age should get a dose of Tdap to help protect the baby from pertussis.   Healthcare professionals who have direct patient contact in hospitals or clinics should get one dose of Tdap.  Protection After a Wound  A person who gets a severe cut or burn might need a dose of Td or Tdap to prevent tetanus infection. Tdap should be used for anyone who has never had a dose previously. Td should be used if Tdap is not available, or for:   Anybody who has already had a dose of Tdap.   Children 7 through 26 years of age who completed the childhood DTaP series.   Adults 65 and older.  Pregnant Women  Pregnant women who have never had a dose of Tdap should get one, after the 20th week of gestation and preferably during the 3rd trimester. If they do not get Tdap during their  pregnancy they should get a dose as soon as possible after delivery. Pregnant women who have previously received Tdap and need tetanus or diphtheria vaccine while pregnant should get Td.  Tdap and Td may be given at the same time as other vaccines. SOME PEOPLE SHOULD NOT BE VACCINATED OR SHOULD WAIT  Anyone who has had a life-threatening allergic reaction after a dose of any tetanus, diphtheria, or pertussis containing vaccine should not get Td or Tdap.   Anyone who has a severe allergy to any component of a vaccine should not get that vaccine. Tell your doctor if the person getting the vaccine has any severe allergies.   Anyone who had a coma, or long or multiple seizures within 7 days after a dose of DTP or DTaP should not get Tdap, unless a cause other than the vaccine was found. These people may get Td.   Talk to your doctor if the person getting either vaccine:   Has epilepsy or another nervous system problem.   Had severe swelling or severe pain after a previous dose of DTP, DTaP,  DT, Td, or Tdap vaccine.   Has had Guillain Barr Syndrome (GBS).  Anyone who has a moderate or severe illness on the day the shot is scheduled should usually wait until they recover before getting Tdap or Td vaccine. A person with a mild illness or low fever can usually be vaccinated. WHAT ARE THE RISKS FROM TDAP AND TD VACCINES? With a vaccine, as with any medicine, there is always a small risk of a life-threatening allergic reaction or other serious problem. Brief fainting spells and related symptoms (such as jerking movements) can happen after any medical procedure, including vaccination. Sitting or lying down for about 15 minutes after a vaccination can help prevent fainting and injuries caused by falls. Tell your doctor if the patient feels dizzy or lightheaded, or has vision changes or ringing in the ears. Getting tetanus, diphtheria, or pertussis would be much more likely to lead to severe problems than getting either Td or Tdap vaccine. Problems reported after Td and Tdap vaccines are listed below. Mild Problems (noticeable, but did not interfere with activities) Tdap  Pain (about 3 in 4 adolescents and 2 in 3 adults).   Redness or swelling (about 1 in 5).   Mild fever of at least 100.4 F (38 C) (up to about 1 in 25 adolescents and 1 in 100 adults).   Headache (about 4 in 10 adolescents and 3 in 10 adults).   Tiredness (about 1 in 3 adolescents and 1 in 4 adults).   Nausea, vomiting, diarrhea, or stomach ache (up to 1 in 4 adolescents and 1 in 10 adults).   Chills, body aches, sore joints, rash, or swollen glands (uncommon).  Td  Pain (up to about 8 in 10).   Redness or swelling at the injection site (up to about 1 in 3).   Mild fever (up to about 1 in 15).   Headache or tiredness (uncommon).  Moderate Problems (interfered with activities, but did not require medical attention) Tdap  Pain at the injection site (about 1 in 20 adolescents and 1 in 100 adults).    Redness or swelling at the injection site (up to about 1 in 16 adolescents and 1 in 25 adults).   Fever over 102 F (38.9 C) (about 1 in 100 adolescents and 1 in 250 adults).   Headache (1 in 300).   Nausea, vomiting, diarrhea, or stomach ache (up to 3  in 100 adolescents and 1 in 100 adults).  Td  Fever over 102 F (38.9 C) (rare).  Tdap or Td  Extensive swelling of the arm where the shot was given (up to about 3 in 100).  Severe Problems (Unable to perform usual activities; required medical attention) Tdap or Td  Swelling, severe pain, bleeding, and redness in the arm where the shot was given (rare).  A severe allergic reaction could occur after any vaccine. They are estimated to occur less than once in a million doses. WHAT IF THERE IS A SEVERE REACTION? What should I look for? Any unusual condition, such as a severe allergic reaction or a high fever. If a severe allergic reaction occurred, it would be within a few minutes to an hour after the shot. Signs of a serious allergic reaction can include difficulty breathing, weakness, hoarseness or wheezing, a fast heartbeat, hives, dizziness, paleness, or swelling of the throat. What should I do?  Call a doctor, or get the person to a doctor right away.   Tell your doctor what happened, the date and time it happened, and when the vaccination was given.   Ask your provider to report the reaction by filing a Vaccine Adverse Event Reporting System (VAERS) form. Or, you can file this report through the VAERS website at www.vaers.LAgents.no or by calling 1-782-752-7493.  VAERS does not provide medical advice. THE NATIONAL VACCINE INJURY COMPENSATION PROGRAM The National Vaccine Injury Compensation Program (VICP) was created in 1986. Persons who believe they may have been injured by a vaccine can learn about the program and about filing a claim by calling 1-(586)419-5406 or visiting the VICP website at SpiritualWord.at. HOW  CAN I LEARN MORE?  Your doctor can give you the vaccine package insert or suggest other sources of information.   Call your local or state health department.   Contact the Centers for Disease Control and Prevention (CDC):   Call 308-802-0304 (1-800-CDC-INFO).   Visit the [facility] website at PicCapture.uy.  CDC Td and Tdap Interim VIS (02/09/10) Document Released: 10/31/2005 Document Revised: 12/23/2010 Document Reviewed: 02/09/2010 [facility] Patient Information 2012 [place], [place].

## 2011-06-18 NOTE — ED Provider Notes (Signed)
History   This chart was scribed for Victor Booze, MD by Brooks Sailors. The patient was seen in room MH01/MH01. Patient's care was started at 1525.   CSN: [id number]  Arrival date & time 06/18/11  1525   First MD Initiated Contact with Patient 06/18/11 1620      Chief Complaint  Patient presents with  . Fall  . Facial Injury    (Consider location/radiation/quality/duration/timing/severity/associated sxs/prior treatment) Patient is a 78 y.o. male presenting with facial injury.  Facial Injury  The incident occurred today. The incident occurred at home. The injury mechanism was a fall. Context: tripped. There is an injury to the nose. The pain is mild (1/10).    Victor Osborne is a 78 y.o. male who presents to the Emergency Department complaining of facial pain after falling and hitting face today. Pt with bleeding from the nose which has currently stopped. Pt tripped over garden hose and hit face on concrete slab and heard a "crack". Pt says his pain is 1 currently. Pt also has an abrasion to the left knee but says it does not hurt. Pt currently on ASA with history of COPD, HTN, and melanoma. Tetanus is not up to date.   PCP Dr. Doristine Counter with [facility].   Past Medical History  Diagnosis Date  . UPJ obstruction, congenital   . COPD (chronic obstructive pulmonary disease)   . Bipolar 1 disorder   . Pneumonia 1970  . Depression   . Thyroid disease   . Hypertension     pt denies but was in medical record  . Skin cancer (melanoma)     Past Surgical History  Procedure Date  . Appendectomy 1939  . Urethra surgery 2010    reconstruction  . Right big toe  surgery 2009  . Malignant melanoma removed from right forehead 2008  . Transurethral resection of prostate 02/04/2011    Procedure: TRANSURETHRAL RESECTION OF THE PROSTATE (TURP);  Surgeon: Crecencio Mc, MD;  Location: [facility];  Service: Urology;  Laterality: N/A;    No family history on file.  History  Substance Use Topics    . Smoking status: Former Smoker -- 2.0 packs/day for 40 years    Types: Cigarettes  . Smokeless tobacco: Never Used  . Alcohol Use: No      Review of Systems  All other systems reviewed and are negative.    Allergies  Review of patient's allergies indicates no known allergies.  Home Medications   Current Outpatient Rx  Name Route Sig Dispense Refill  . VITAMIN C 1000 MG PO TABS Oral Take 1,000 mg by mouth 2 (two) times daily.     . ASPIRIN 81 MG PO TABS Oral Take 81 mg by mouth daily.    . BECLOMETHASONE DIPROPIONATE 40 MCG/ACT IN AERS Inhalation Inhale 2 puffs into the lungs 2 (two) times daily.    Marland Kitchen CARBAMAZEPINE 200 MG PO TABS Oral Take 200 mg by mouth 4 (four) times daily. 1 tablet in the morning and 2 tablets twice a day.    Marland Kitchen VITAMIN D 1000 UNITS PO TABS Oral Take 1,000 Units by mouth daily.      Marland Kitchen FLAX SEED OIL 1300 MG PO CAPS Oral Take 1 capsule by mouth 2 (two) times daily.     Marland Kitchen FLUOXETINE HCL 20 MG PO TABS Oral Take 20 mg by mouth 2 (two) times daily.     Marland Kitchen GARLIC 1000 MG PO CAPS Oral Take 1 capsule by mouth 2 (two)  times daily.     Marland Kitchen GLUCOSAMINE 1500 COMPLEX PO Oral Take 1 tablet by mouth 2 (two) times daily.    Marland Kitchen HAWTHORN BERRY PO Oral Take 565 mg by mouth 2 (two) times daily.    Marland Kitchen LEVOTHYROXINE SODIUM 125 MCG PO TABS Oral Take 125 mcg by mouth daily before breakfast.     . ADULT MULTIVITAMIN W/MINERALS CH Oral Take 1 tablet by mouth daily.    . OMEGA-3 CF PO Oral Take 1,200 mg by mouth daily.     Marland Kitchen PRAVASTATIN SODIUM 40 MG PO TABS Oral Take 40 mg by mouth at bedtime.     . TRIFLUOPERAZINE HCL 2 MG PO TABS Oral Take 2 mg by mouth daily after breakfast.       BP 129/75  Pulse 62  Temp(Src) 97.8 F (36.6 C) (Oral)  Resp 18  Ht 6\' 4"  (1.93 m)  Wt 230 lb (104.327 kg)  BMI 28.00 kg/m2  SpO2 96%  Physical Exam  Nursing note and vitals reviewed. Constitutional: He is oriented to person, place, and time. He appears well-developed and well-nourished.  HENT:   Head: Normocephalic.       mild swelling of the nose, No tenderness of the nose, Blood present in the left nostril, no active bleeding, Superficial mucosal laceration of the upper lip.  Eyes: Conjunctivae and EOM are normal. Pupils are equal, round, and reactive to light.  Neck: Normal range of motion. Neck supple.  Cardiovascular: Normal rate and regular rhythm.   Pulmonary/Chest: Effort normal and breath sounds normal.  Abdominal: Soft. Bowel sounds are normal.  Musculoskeletal: Normal range of motion.  Neurological: He is alert and oriented to person, place, and time.       Resting tremor present.   Skin: Skin is warm and dry.  Psychiatric: He has a normal mood and affect.    ED Course  Procedures (including critical care time) DIAGNOSTIC STUDIES: Oxygen Saturation is 96% on room air, adequate by my interpretation.    COORDINATION OF CARE: 1637 Pt seen and evaluated. TO have scan of the neck, face and head. Tetanus shot ordered.     Results for orders placed during the hospital encounter of 02/04/11  GLUCOSE, CAPILLARY      Component Value Range   Glucose-Capillary 99  70 - 99 (mg/dL)   Comment 1 Documented in Chart     Comment 2 Notify RN    BASIC METABOLIC PANEL      Component Value Range   Sodium 143  135 - 145 (mEq/L)   Potassium 4.2  3.5 - 5.1 (mEq/L)   Chloride 109  96 - 112 (mEq/L)   CO2 24  19 - 32 (mEq/L)   Glucose, Bld 80  70 - 99 (mg/dL)   BUN 17  6 - 23 (mg/dL)   Creatinine, Ser 1.47  0.50 - 1.35 (mg/dL)   Calcium 8.9  8.4 - 82.9 (mg/dL)   GFR calc non Af Amer 60 (*) >90 (mL/min)   GFR calc Af Amer 69 (*) >90 (mL/min)   Ct Head Wo Contrast  06/18/2011  *RADIOLOGY REPORT*  Clinical Data:  78 year old male status post fall with facial injury. Abrasions.  CT HEAD WITHOUT CONTRAST CT MAXILLOFACIAL WITHOUT CONTRAST CT CERVICAL SPINE WITHOUT CONTRAST  Technique:  Multidetector CT imaging of the head, cervical spine, and maxillofacial structures were performed  using the standard protocol without intravenous contrast. Multiplanar CT image reconstructions of the cervical spine and maxillofacial structures were also generated.  Comparison:  Cervical MRI 09/28/2010.  CT HEAD  Findings: Facial findings are below.  Right lateral convexity scalp hematoma partially inseparable from the temporalis muscle measuring up to 5 mm in thickness.  Calvarium intact.  Mastoid air cells are clear.  No acute intracranial hemorrhage identified.  No midline shift, mass effect, or evidence of mass lesion.  No ventriculomegaly. No evidence of cortically based acute infarction identified.  No suspicious intracranial vascular hyperdensity.  IMPRESSION: 1.  No acute traumatic injury to the brain identified.  Face and cervical findings are below. 2.  Lateral scalp hematoma without underlying fracture.  CT MAXILLOFACIAL  Findings:  Visualized deep soft tissue spaces of the face are within normal limits.  Forehead scalp soft tissue thickening may reflect acute injury. Visualized orbit soft tissues are within normal limits.  No other superficial soft tissue injury identified.  Left greater than right mildly displaced nasal bone fractures. Mandible appears intact.  Maxillary sinuses and maxilla intact.  No other acute facial fracture identified.  IMPRESSION: 1.  Nasal bone fractures.  Face and forehead soft tissue injury. 2.  Cervical findings are below.  CT CERVICAL SPINE  Findings:   Straightening of cervical lordosis. Visualized skull base is intact.  No atlanto-occipital dissociation. Cervicothoracic junction alignment is within normal limits. Bilateral posterior element alignment is within normal limits. Bulky facet and endplate degenerative spurring throughout the cervical spine.  There is widespread degenerative cervical spinal stenosis.  No acute cervical fracture identified. Visualized paraspinal soft tissues are within normal limits.  IMPRESSION: 1. No acute fracture or listhesis identified in  the cervical spine. Ligamentous injury is not excluded. 2.  Advance cervical degenerative changes with widespread significant cervical spinal stenosis.  Original Report Authenticated By: Harley Hallmark, M.D.   Ct Cervical Spine Wo Contrast  06/18/2011  *RADIOLOGY REPORT*  Clinical Data:  78 year old male status post fall with facial injury. Abrasions.  CT HEAD WITHOUT CONTRAST CT MAXILLOFACIAL WITHOUT CONTRAST CT CERVICAL SPINE WITHOUT CONTRAST  Technique:  Multidetector CT imaging of the head, cervical spine, and maxillofacial structures were performed using the standard protocol without intravenous contrast. Multiplanar CT image reconstructions of the cervical spine and maxillofacial structures were also generated.  Comparison:  Cervical MRI 09/28/2010.  CT HEAD  Findings: Facial findings are below.  Right lateral convexity scalp hematoma partially inseparable from the temporalis muscle measuring up to 5 mm in thickness.  Calvarium intact.  Mastoid air cells are clear.  No acute intracranial hemorrhage identified.  No midline shift, mass effect, or evidence of mass lesion.  No ventriculomegaly. No evidence of cortically based acute infarction identified.  No suspicious intracranial vascular hyperdensity.  IMPRESSION: 1.  No acute traumatic injury to the brain identified.  Face and cervical findings are below. 2.  Lateral scalp hematoma without underlying fracture.  CT MAXILLOFACIAL  Findings:  Visualized deep soft tissue spaces of the face are within normal limits.  Forehead scalp soft tissue thickening may reflect acute injury. Visualized orbit soft tissues are within normal limits.  No other superficial soft tissue injury identified.  Left greater than right mildly displaced nasal bone fractures. Mandible appears intact.  Maxillary sinuses and maxilla intact.  No other acute facial fracture identified.  IMPRESSION: 1.  Nasal bone fractures.  Face and forehead soft tissue injury. 2.  Cervical findings are  below.  CT CERVICAL SPINE  Findings:   Straightening of cervical lordosis. Visualized skull base is intact.  No atlanto-occipital dissociation. Cervicothoracic junction alignment is within normal limits. Bilateral posterior  element alignment is within normal limits. Bulky facet and endplate degenerative spurring throughout the cervical spine.  There is widespread degenerative cervical spinal stenosis.  No acute cervical fracture identified. Visualized paraspinal soft tissues are within normal limits.  IMPRESSION: 1. No acute fracture or listhesis identified in the cervical spine. Ligamentous injury is not excluded. 2.  Advance cervical degenerative changes with widespread significant cervical spinal stenosis.  Original Report Authenticated By: Harley Hallmark, M.D.   Ct Maxillofacial Wo Cm  06/18/2011  *RADIOLOGY REPORT*  Clinical Data:  78 year old male status post fall with facial injury. Abrasions.  CT HEAD WITHOUT CONTRAST CT MAXILLOFACIAL WITHOUT CONTRAST CT CERVICAL SPINE WITHOUT CONTRAST  Technique:  Multidetector CT imaging of the head, cervical spine, and maxillofacial structures were performed using the standard protocol without intravenous contrast. Multiplanar CT image reconstructions of the cervical spine and maxillofacial structures were also generated.  Comparison:  Cervical MRI 09/28/2010.  CT HEAD  Findings: Facial findings are below.  Right lateral convexity scalp hematoma partially inseparable from the temporalis muscle measuring up to 5 mm in thickness.  Calvarium intact.  Mastoid air cells are clear.  No acute intracranial hemorrhage identified.  No midline shift, mass effect, or evidence of mass lesion.  No ventriculomegaly. No evidence of cortically based acute infarction identified.  No suspicious intracranial vascular hyperdensity.  IMPRESSION: 1.  No acute traumatic injury to the brain identified.  Face and cervical findings are below. 2.  Lateral scalp hematoma without underlying  fracture.  CT MAXILLOFACIAL  Findings:  Visualized deep soft tissue spaces of the face are within normal limits.  Forehead scalp soft tissue thickening may reflect acute injury. Visualized orbit soft tissues are within normal limits.  No other superficial soft tissue injury identified.  Left greater than right mildly displaced nasal bone fractures. Mandible appears intact.  Maxillary sinuses and maxilla intact.  No other acute facial fracture identified.  IMPRESSION: 1.  Nasal bone fractures.  Face and forehead soft tissue injury. 2.  Cervical findings are below.  CT CERVICAL SPINE  Findings:   Straightening of cervical lordosis. Visualized skull base is intact.  No atlanto-occipital dissociation. Cervicothoracic junction alignment is within normal limits. Bilateral posterior element alignment is within normal limits. Bulky facet and endplate degenerative spurring throughout the cervical spine.  There is widespread degenerative cervical spinal stenosis.  No acute cervical fracture identified. Visualized paraspinal soft tissues are within normal limits.  IMPRESSION: 1. No acute fracture or listhesis identified in the cervical spine. Ligamentous injury is not excluded. 2.  Advance cervical degenerative changes with widespread significant cervical spinal stenosis.  Original Report Authenticated By: Harley Hallmark, M.D.     1. Fall   2. Nasal bone fracture       MDM  Fall with nasal injury and superficial lacerations that nothing requiring suturing. CT scan to be obtained to rule out intracranial injury and fracture. She got booster will be given.  CT scans confirm minimally displaced nasal fractures. He'll be discharged with instructions on applying ice and taking over-the-counter      I personally performed the services described in this documentation, which was scribed in my presence. The recorded information has been reviewed and considered.      Victor Booze, MD 06/18/11 [phone]

## 2011-06-20 ENCOUNTER — Ambulatory Visit: Payer: Medicare Other

## 2011-07-15 ENCOUNTER — Encounter: Payer: Self-pay | Admitting: Internal Medicine

## 2011-07-15 ENCOUNTER — Ambulatory Visit (INDEPENDENT_AMBULATORY_CARE_PROVIDER_SITE_OTHER): Payer: Medicare Other | Admitting: Internal Medicine

## 2011-07-15 VITALS — BP 122/72 | HR 64 | Ht 76.0 in | Wt 225.0 lb

## 2011-07-15 DIAGNOSIS — G4733 Obstructive sleep apnea (adult) (pediatric): Secondary | ICD-10-CM

## 2011-07-15 DIAGNOSIS — J449 Chronic obstructive pulmonary disease, unspecified: Secondary | ICD-10-CM

## 2011-07-15 LAB — PULMONARY FUNCTION TEST

## 2011-07-15 MED ORDER — MOMETASONE FURO-FORMOTEROL FUM 100-5 MCG/ACT IN AERO: INHALATION_SPRAY | RESPIRATORY_TRACT | Status: DC

## 2011-07-15 NOTE — Progress Notes (Signed)
PFT done today. 

## 2011-07-15 NOTE — Patient Instructions (Addendum)
Script for [employer] 100 inhaler Called   Please call as needed

## 2011-07-15 NOTE — Progress Notes (Signed)
12/28/10- 78 yoM former smoker, followed for COPD, OSA, complicated by BiPolar, HBP, hx melanoma, DM LOV-12/19/2007 Has had flu vaccine. He had changed his CPAP company to [employer], but complains that his mask is old and worn out he can't wear it. Therefore he has not used CPAP in some months. His wife tells him that without CPAP he snores loudly and talks in his sleep. He recognizes he is not sleeping as well. His COPD has been stable with insignificant cough and wheeze. He denies noticing shortness of breath while walking. He has no inhaled medications. CXR-04/09/2009-COPD, no active process.  06/14/11- 27 yoM former smoker, followed for COPD, OSA, complicated by BiPolar, HBP, hx melanoma, DM Cough from time to time: CAT score of 13; Was given QVAR from PCP and can tell slight difference-? aerochamber to use with it. Says he has had productive cough for about a year. Some increased shortness of breath making it hard to say. Gradually worse since last office visit. Dr. Doristine Counter gave Qvar which may help some. Continues CPAP for sleep apnea with good compliance and control. Not Parkinson, but may be related to his bipolar medicines. CXR 02/02/11-  IMPRESSION:  Generalized hyperinflation configuration consistent with COPD. No  acute superimposed abnormality.  Original Report Authenticated By: Crawford Givens, M.D.    07/15/11-  38 yoM former smoker, followed for COPD, OSA, complicated by BiPolar, HBP, hx melanoma, DM  Patient states about the same as last visit. c/o sob with exertion and  a little chest tightness. Denies chest pain, wheezing, and cough.  COPD Assessment Test (CAT) score today 9/40 6 MWT- 99%, 100%, 99%, 510 meters PFT 07/07/2011-mild obstructive airways disease, small airways, insignificant response to bronchodilator. Normal lung volumes, normal diffusion capacity. FEV1/FVC 0.61 For sleep apnea CPAP 9 is comfortable usually. In early June he had fallen and hit his head with fractured nose  so he had to stop using CPAP for a while but is getting started again. He likes [facility] which is associated with decreased cough. Dyspnea on exertion noted pulling empty trash can 75 feet up of mild slope. AeroChamber spacer helps with inhaler.  ROS-see HPI Constitutional:   No-   weight loss, night sweats, fevers, chills, fatigue, lassitude. HEENT:   No-  headaches, difficulty swallowing, tooth/dental problems, sore throat,       No-  sneezing, itching, ear ache, nasal congestion, post nasal drip,  CV:  No-   chest pain, orthopnea, PND, swelling in lower extremities, anasarca, dizziness, palpitations Resp: +  shortness of breath with exertion or at rest.              +  productive cough,  No non-productive cough,  No- coughing up of blood.              No-   change in color of mucus.  No- wheezing.   Skin: No-   rash or lesions. GI:  No-   heartburn, indigestion, abdominal pain, nausea, vomiting,  GU:  MS:  + joint pain or swelling. Weakness and falls. Neuro-     tremor Psych:  No- change in mood or affect. No depression or anxiety.  No memory loss.  OBJ BP 122/72  Pulse 64  Ht 6\' 4"  (1.93 m)  Wt 225 lb (102.059 kg)  BMI 27.39 kg/m2  SpO2 98%  General- Alert, Oriented, Affect-appropriate, Distress- none acute, looks well Skin- rash-none, lesions- none, excoriation- none Lymphadenopathy- none Head- atraumatic  Eyes- Gross vision intact, PERRLA, conjunctivae clear secretions            Ears- Hearing, canals-normal            Nose- Clear, no-Septal dev, mucus, polyps, erosion, perforation             Throat- Mallampati II , mucosa clear , drainage- none, tonsils- atrophic Neck- flexible , trachea midline, no stridor , thyroid nl, carotid no bruit Chest - symmetrical excursion , unlabored           Heart/CV- RRR , no murmur , no gallop  , no rub, nl s1 s2                           - JVD- none , edema- none, stasis changes- none, varices- none           Lung- clear,  unlabored, wheeze- none, cough- none , dullness-none, rub- none           Chest wall-  Abd-   HSM- noBr/ Gen/ Rectal- Not done, not indicated Extrem- cyanosis- none, clubbing, none, atrophy- none, strength- nl, using a cane.  Neuro- resting tremor of hands

## 2011-07-18 ENCOUNTER — Encounter: Payer: Self-pay | Admitting: Internal Medicine

## 2011-07-18 NOTE — Assessment & Plan Note (Signed)
He understands to pace himself. Continue Dulera.

## 2011-07-18 NOTE — Assessment & Plan Note (Signed)
Compliance and control had been good with CPAP 9. Fall with nasal fracture, now in recovery, had interfered with CPAP use for a while.

## 2011-07-19 NOTE — Progress Notes (Signed)
Documentation for 6 minute walk test 

## 2011-07-27 ENCOUNTER — Telehealth: Payer: Self-pay | Admitting: Internal Medicine

## 2011-07-27 MED ORDER — MOMETASONE FURO-FORMOTEROL FUM 100-5 MCG/ACT IN AERO: INHALATION_SPRAY | RESPIRATORY_TRACT | Status: DC

## 2011-07-27 NOTE — Telephone Encounter (Signed)
RX resent to [facility] per patient request.

## 2011-08-25 ENCOUNTER — Telehealth: Payer: Self-pay | Admitting: Internal Medicine

## 2011-08-25 MED ORDER — INDACATEROL MALEATE 75 MCG IN CAPS: 1.0000 | ORAL_CAPSULE | Freq: Every day | RESPIRATORY_TRACT | Status: DC

## 2011-08-25 NOTE — Telephone Encounter (Signed)
Per CY-Try Arcapta sample-1 capsule via inhaler once daily.

## 2011-08-25 NOTE — Telephone Encounter (Signed)
I spoke with pt and he states his throat was very irritated and it made his voice pitchy. He quit using this 2 weeks ago. He is requesting an alternative to [place]. Please advise Dr. Maple Hudson thanks  No Known Allergies

## 2011-08-25 NOTE — Telephone Encounter (Signed)
rx sent. Pt is aware. Jennifer Castillo, CMA  

## 2011-11-15 ENCOUNTER — Telehealth: Payer: Self-pay | Admitting: *Deleted

## 2011-11-15 NOTE — Telephone Encounter (Signed)
Spoke with his daughter Sandy Salaam which is seen by Dr Elease Hashimoto.  Her father, Mr Mayhall had a colonoscopy today and an abn rhythm which was seen by Dr Elnoria Howard and was told to be seen by cardiology. No availability in [facility] office, made app this week to be seen in [place] office by Dr Elease Hashimoto, pt and family happy with outcome.

## 2011-11-18 ENCOUNTER — Ambulatory Visit: Payer: Medicare Other | Admitting: Cardiovascular Disease

## 2011-12-07 ENCOUNTER — Other Ambulatory Visit (HOSPITAL_COMMUNITY): Payer: Self-pay | Admitting: Urology

## 2011-12-07 DIAGNOSIS — N135 Crossing vessel and stricture of ureter without hydronephrosis: Secondary | ICD-10-CM

## 2011-12-28 ENCOUNTER — Ambulatory Visit: Payer: Medicare Other | Admitting: Internal Medicine

## 2012-01-26 ENCOUNTER — Encounter (HOSPITAL_COMMUNITY)
Admission: RE | Admit: 2012-01-26 | Discharge: 2012-01-26 | Disposition: A | Discharge status: Home / Self Care | Payer: Medicare Other | Source: Ambulatory Visit | Attending: Urology | Admitting: Urology

## 2012-01-26 ENCOUNTER — Encounter (HOSPITAL_COMMUNITY): Payer: Self-pay

## 2012-01-26 DIAGNOSIS — N135 Crossing vessel and stricture of ureter without hydronephrosis: Secondary | ICD-10-CM | POA: Insufficient documentation

## 2012-01-26 MED ORDER — TECHNETIUM TC 99M MERTIATIDE
16.0000 | Freq: Once | INTRAVENOUS | Status: AC | PRN
  Administered 2012-01-26: 16 via INTRAVENOUS

## 2012-01-26 MED ORDER — FUROSEMIDE 10 MG/ML IJ SOLN
51.1000 mg | Freq: Once | INTRAMUSCULAR | Status: AC
  Administered 2012-01-26: 51.1 mg via INTRAVENOUS
  Filled 2012-01-26: qty 2

## 2012-03-13 ENCOUNTER — Emergency Department (HOSPITAL_COMMUNITY)
Admission: EM | Admit: 2012-03-13 | Discharge: 2012-03-13 | Disposition: A | Discharge status: Home / Self Care | Payer: Medicare Other | Attending: Emergency Medicine | Admitting: Emergency Medicine

## 2012-03-13 ENCOUNTER — Encounter (HOSPITAL_COMMUNITY): Payer: Self-pay

## 2012-03-13 ENCOUNTER — Emergency Department (HOSPITAL_COMMUNITY): Payer: Medicare Other

## 2012-03-13 DIAGNOSIS — S0100XA Unspecified open wound of scalp, initial encounter: Secondary | ICD-10-CM | POA: Insufficient documentation

## 2012-03-13 DIAGNOSIS — Z85820 Personal history of malignant melanoma of skin: Secondary | ICD-10-CM | POA: Insufficient documentation

## 2012-03-13 DIAGNOSIS — Z87448 Personal history of other diseases of urinary system: Secondary | ICD-10-CM | POA: Insufficient documentation

## 2012-03-13 DIAGNOSIS — J4489 Other specified chronic obstructive pulmonary disease: Secondary | ICD-10-CM | POA: Insufficient documentation

## 2012-03-13 DIAGNOSIS — I10 Essential (primary) hypertension: Secondary | ICD-10-CM | POA: Insufficient documentation

## 2012-03-13 DIAGNOSIS — W11XXXA Fall on and from ladder, initial encounter: Secondary | ICD-10-CM | POA: Insufficient documentation

## 2012-03-13 DIAGNOSIS — Z8701 Personal history of pneumonia (recurrent): Secondary | ICD-10-CM | POA: Insufficient documentation

## 2012-03-13 DIAGNOSIS — S069X9A Unspecified intracranial injury with loss of consciousness of unspecified duration, initial encounter: Secondary | ICD-10-CM

## 2012-03-13 DIAGNOSIS — Z79899 Other long term (current) drug therapy: Secondary | ICD-10-CM | POA: Insufficient documentation

## 2012-03-13 DIAGNOSIS — F319 Bipolar disorder, unspecified: Secondary | ICD-10-CM | POA: Insufficient documentation

## 2012-03-13 DIAGNOSIS — M542 Cervicalgia: Secondary | ICD-10-CM | POA: Insufficient documentation

## 2012-03-13 DIAGNOSIS — Y9289 Other specified places as the place of occurrence of the external cause: Secondary | ICD-10-CM | POA: Insufficient documentation

## 2012-03-13 DIAGNOSIS — Z7982 Long term (current) use of aspirin: Secondary | ICD-10-CM | POA: Insufficient documentation

## 2012-03-13 DIAGNOSIS — E079 Disorder of thyroid, unspecified: Secondary | ICD-10-CM | POA: Insufficient documentation

## 2012-03-13 DIAGNOSIS — J449 Chronic obstructive pulmonary disease, unspecified: Secondary | ICD-10-CM | POA: Insufficient documentation

## 2012-03-13 DIAGNOSIS — Z87891 Personal history of nicotine dependence: Secondary | ICD-10-CM | POA: Insufficient documentation

## 2012-03-13 DIAGNOSIS — Y9389 Activity, other specified: Secondary | ICD-10-CM | POA: Insufficient documentation

## 2012-03-13 DIAGNOSIS — S060X9A Concussion with loss of consciousness of unspecified duration, initial encounter: Secondary | ICD-10-CM | POA: Insufficient documentation

## 2012-03-13 DIAGNOSIS — S0101XA Laceration without foreign body of scalp, initial encounter: Secondary | ICD-10-CM

## 2012-03-13 LAB — CBC WITH DIFFERENTIAL/PLATELET
Eosinophils Absolute: 0.3 10*3/uL (ref 0.0–0.7)
Eosinophils Relative: 4 % (ref 0–5)
HCT: 43.3 % (ref 39.0–52.0)
Hemoglobin: 15 g/dL (ref 13.0–17.0)
Lymphs Abs: 0.7 10*3/uL (ref 0.7–4.0)
MCH: 32.8 pg (ref 26.0–34.0)
MCHC: 34.6 g/dL (ref 30.0–36.0)
MCV: 94.7 fL (ref 78.0–100.0)
Monocytes Absolute: 0.6 10*3/uL (ref 0.1–1.0)
Monocytes Relative: 9 % (ref 3–12)
Neutrophils Relative %: 76 % (ref 43–77)
RBC: 4.57 MIL/uL (ref 4.22–5.81)

## 2012-03-13 LAB — COMPREHENSIVE METABOLIC PANEL
Alkaline Phosphatase: 101 U/L (ref 39–117)
BUN: 21 mg/dL (ref 6–23)
Creatinine, Ser: 1 mg/dL (ref 0.50–1.35)
GFR calc Af Amer: 82 mL/min — ABNORMAL LOW (ref >90)
Glucose, Bld: 81 mg/dL (ref 70–99)
Potassium: 4.1 mEq/L (ref 3.5–5.1)
Total Protein: 6.9 g/dL (ref 6.0–8.3)

## 2012-03-13 LAB — PROTIME-INR
INR: 0.94 (ref 0.00–1.49)
Prothrombin Time: 12.5 seconds (ref 11.6–15.2)

## 2012-03-13 NOTE — ED Notes (Signed)
Per ems- Pt was getting on ladder from attic and fell 12 ft. Pos LOC. Pt was unresponsive from 3-5 min. Upon ems arrival, GCS 15, alert and oriented. Pt not on blood thinners. Pt hit head on brick wall on way down. Pt has small lac on back right of head. 20g IV in LAC. Pt only c/o pain in back of head, 2/10. BP-160/96 Hr-60 NSR o2-99% on RA RR-20 CBG-78

## 2012-03-13 NOTE — ED Provider Notes (Signed)
History  This chart was scribed for Loren Racer, MD by Shari Heritage, ED Scribe. The patient was seen in room CD01C/CD01C. Patient's care was started at 1555.   CSN: [id number]  Arrival date & time 03/13/12  1555   First MD Initiated Contact with Patient 03/13/12 1555      Chief Complaint  Patient presents with  . Fall     Patient is a 79 y.o. male presenting with fall. The history is provided by the patient. No language interpreter was used.  Fall Incident onset: today. The fall occurred from a ladder. He fell from a height of 11 to 15 ft. Impact surface: brick. There was no blood loss. The point of impact was the head and neck. The pain is present in the head and neck. The pain is mild. Associated symptoms include loss of consciousness. He has tried immobilization for the symptoms.    HPI Comments: Victor Osborne is a 79 y.o. male who presents to the Emergency Department via EMS complaining of posterior cervical pain and laceration to the back of the head resulting from a fall that occurred earlier today.  Patient rates current pain as 2/10. Patient states that he was trying to climb down a ladder from his attic when he missed a step and fell onto brick hitting his head. He reports brief loss of consciousness after the fall. Patient states that he has a baseline tremor that has been attributed to his bipolar medications. Patient's medical history includes COPD, hypertension and skin cancer.  Past Medical History  Diagnosis Date  . UPJ obstruction, congenital   . COPD (chronic obstructive pulmonary disease)   . Bipolar 1 disorder   . Pneumonia 1970  . Depression   . Thyroid disease   . Hypertension     pt denies but was in medical record  . Skin cancer (melanoma)     Past Surgical History  Procedure Laterality Date  . Appendectomy  1939  . Urethra surgery  2010    reconstruction  . Right big toe  surgery  2009  . Malignant melanoma removed from right forehead  2008  .  Transurethral resection of prostate  02/04/2011    Procedure: TRANSURETHRAL RESECTION OF THE PROSTATE (TURP);  Surgeon: Crecencio Mc, MD;  Location: [facility];  Service: Urology;  Laterality: N/A;    History reviewed. No pertinent family history.  History  Substance Use Topics  . Smoking status: Former Smoker -- 2.00 packs/day for 40 years    Types: Cigarettes  . Smokeless tobacco: Never Used  . Alcohol Use: No      Review of Systems  HENT: Positive for neck pain.   Skin: Positive for wound.  Neurological: Positive for loss of consciousness.  All other systems reviewed and are negative.    Allergies  Review of patient's allergies indicates no known allergies.  Home Medications   Current Outpatient Rx  Name  Route  Sig  Dispense  Refill  . Ascorbic Acid (VITAMIN C) 1000 MG tablet   Oral   Take 1,000 mg by mouth 2 (two) times daily.          Marland Kitchen aspirin 81 MG tablet   Oral   Take 81 mg by mouth daily.         . carbamazepine (TEGRETOL) 200 MG tablet   Oral   Take 200 mg by mouth 4 (four) times daily. 1 tablet in the morning and 2 tablets twice a day.         Marland Kitchen  Carbidopa-Levodopa (SINEMET PO)   Oral   Take 1 tablet by mouth daily.         . cholecalciferol (VITAMIN D) 1000 UNITS tablet   Oral   Take 1,000 Units by mouth daily.           Marland Kitchen co-enzyme Q-10 30 MG capsule   Oral   Take 30 mg by mouth daily. Hold while in hospital         . desoximetasone (TOPICORT) 0.25 % cream   Topical   Apply 1 application topically 2 (two) times daily.         . Flaxseed, Linseed, (FLAX SEED OIL) 1300 MG CAPS   Oral   Take 1 capsule by mouth 2 (two) times daily.          Marland Kitchen FLUoxetine (PROZAC) 20 MG tablet   Oral   Take 20 mg by mouth 2 (two) times daily.          . Garlic 1000 MG CAPS   Oral   Take 1 capsule by mouth 2 (two) times daily. Hold while in hospital         . Glucosamine-Chondroit-Vit C-Mn (GLUCOSAMINE 1500 COMPLEX PO)   Oral   Take 1 tablet  by mouth 2 (two) times daily. Hold while in hospital         . HAWTHORN BERRY PO   Oral   Take 565 mg by mouth 2 (two) times daily.         Marland Kitchen levothyroxine (LEVOTHROID) 125 MCG tablet   Oral   Take 125 mcg by mouth daily before breakfast.          . mometasone-formoterol (DULERA) 100-5 MCG/ACT AERO      2 puffs then rinse mouth, twice daily   3 Inhaler   3   . Multiple Vitamin (MULITIVITAMIN WITH MINERALS) TABS   Oral   Take 1 tablet by mouth daily.         . naproxen sodium (ANAPROX) 220 MG tablet   Oral   Take 220 mg by mouth daily as needed. For pain         . Omega-3 Fatty Acids (OMEGA-3 CF PO)   Oral   Take 1,200 mg by mouth daily.          . pravastatin (PRAVACHOL) 40 MG tablet   Oral   Take 40 mg by mouth at bedtime.          Marland Kitchen trifluoperazine (STELAZINE) 2 MG tablet   Oral   Take 2 mg by mouth daily after breakfast.            Triage Vitals: BP 146/80  Pulse 58  Temp(Src) 97.9 F (36.6 C) (Oral)  SpO2 99%  Physical Exam  Constitutional: He is oriented to person, place, and time. He appears well-developed and well-nourished.  C-collar in place.  HENT:  Head: Normocephalic. Head is with laceration.  No obvious facial trauma.   2 cm linear laceration posterior to right ear over mastoid process. No active bleeding. No obvious foreign bodies.   Eyes: Conjunctivae and EOM are normal. Pupils are equal, round, and reactive to light.  Neck: Normal range of motion. Neck supple.  Cardiovascular: Normal rate, regular rhythm and normal heart sounds.   Pulmonary/Chest: Effort normal and breath sounds normal.  Abdominal: Soft.  Musculoskeletal:  No lumbar or thoracic spine tenderness.   Neurological: He is alert and oriented to person, place, and time.  5/5 motor in all extremities.  Skin: Skin is warm and dry.  Psychiatric: He has a normal mood and affect. His behavior is normal.    ED Course  Procedures (including critical care  time) DIAGNOSTIC STUDIES: Oxygen Saturation is 99% on room air, normal by my interpretation.    COORDINATION OF CARE: 4:19 PM- Will order head CT, CT of C-spine, CBC with diff, CMP, INR and APTT. Patient informed of current plan for treatment and evaluation and agrees with plan at this time.   6:28 PM- Wound area irrigated thoroughly by staff. Placed 4 staples to laceration site. Advised to return in 1 week for removal.  LACERATION REPAIR PROCEDURE NOTE The patient's identification was confirmed and consent was obtained. This procedure was performed by Loren Racer, MD at 6:29 PM. Site: posterior to right ear over mastoid process. Sterile procedures observed: yes Anesthetic used (type and amt): 3 cc of 1% lido w epi Length: 2 cm # of Staples: 4 Site anesthetized, irrigated with NS, explored without evidence of foreign body, wound well approximated, site covered with dry, sterile dressing.  Patient tolerated procedure well without complications. Instructions for care discussed verbally and patient provided with additional written instructions for homecare and f/u.   Labs Reviewed  CBC WITH DIFFERENTIAL - Abnormal; Notable for the following:    Lymphocytes Relative 11 (*)    All other components within normal limits  COMPREHENSIVE METABOLIC PANEL - Abnormal; Notable for the following:    Total Bilirubin 0.2 (*)    GFR calc non Af Amer 70 (*)    GFR calc Af Amer 82 (*)    All other components within normal limits  APTT - Abnormal; Notable for the following:    aPTT 20 (*)    All other components within normal limits  PROTIME-INR    Ct Head Wo Contrast  03/13/2012  *RADIOLOGY REPORT*  Clinical Data:  79 year old male with head and neck injury following fall.  Headache and neck pain.  CT HEAD WITHOUT CONTRAST CT CERVICAL SPINE WITHOUT CONTRAST  Technique:  Multidetector CT imaging of the head and cervical spine was performed following the standard protocol without intravenous  contrast.  Multiplanar CT image reconstructions of the cervical spine were also generated.  Comparison:  06/18/2011 CT  CT HEAD  Findings: Atrophy and mild chronic small vessel white matter ischemic changes noted. No acute intracranial abnormalities are identified, including mass lesion or mass effect, hydrocephalus, extra-axial fluid collection, midline shift, hemorrhage, or acute infarction.  The visualized bony calvarium is unremarkable.  IMPRESSION: No evidence of acute intracranial abnormality.  CT CERVICAL SPINE  Findings: Straightening of the normal cervical lordosis is again identified. There is no evidence of acute fracture, subluxation or prevertebral soft tissue swelling.  Multilevel degenerative disc disease and spondylosis identified, moderate to severe from C4-C7. Moderate to severe facet arthropathy in the upper cervical spine noted, left greater than right. These degenerative changes contribute to central spinal and biforaminal narrowing throughout the cervical spine, unchanged.  No focal bony lesions are identified.  IMPRESSION: No static evidence of acute injury to the cervical spine.  Moderate to severe multilevel degenerative changes again noted.   Original Report Authenticated By: Harmon Pier, M.D.    Ct Cervical Spine Wo Contrast  03/13/2012  *RADIOLOGY REPORT*  Clinical Data:  79 year old male with head and neck injury following fall.  Headache and neck pain.  CT HEAD WITHOUT CONTRAST CT CERVICAL SPINE WITHOUT CONTRAST  Technique:  Multidetector CT imaging of the head and cervical spine was performed  following the standard protocol without intravenous contrast.  Multiplanar CT image reconstructions of the cervical spine were also generated.  Comparison:  06/18/2011 CT  CT HEAD  Findings: Atrophy and mild chronic small vessel white matter ischemic changes noted. No acute intracranial abnormalities are identified, including mass lesion or mass effect, hydrocephalus, extra-axial fluid  collection, midline shift, hemorrhage, or acute infarction.  The visualized bony calvarium is unremarkable.  IMPRESSION: No evidence of acute intracranial abnormality.  CT CERVICAL SPINE  Findings: Straightening of the normal cervical lordosis is again identified. There is no evidence of acute fracture, subluxation or prevertebral soft tissue swelling.  Multilevel degenerative disc disease and spondylosis identified, moderate to severe from C4-C7. Moderate to severe facet arthropathy in the upper cervical spine noted, left greater than right. These degenerative changes contribute to central spinal and biforaminal narrowing throughout the cervical spine, unchanged.  No focal bony lesions are identified.  IMPRESSION: No static evidence of acute injury to the cervical spine.  Moderate to severe multilevel degenerative changes again noted.   Original Report Authenticated By: Harmon Pier, M.D.      1. Closed head injury with brief loss of consciousness, initial encounter   2. Scalp laceration, initial encounter       MDM  I personally performed the services described in this documentation, which was scribed in my presence. The recorded information has been reviewed and is accurate.    Loren Racer, MD 03/14/12 [phone]

## 2012-03-13 NOTE — ED Notes (Signed)
Pt c/o mild neck pain and moderate "occipital pain." Pt remembers falling and then  Remembers waking up with EMS. Currently alert and oriented. Able to move all extremities equally. No neuro deficits at this time.

## 2012-07-16 ENCOUNTER — Encounter: Payer: Self-pay | Admitting: Internal Medicine

## 2012-07-16 ENCOUNTER — Ambulatory Visit (INDEPENDENT_AMBULATORY_CARE_PROVIDER_SITE_OTHER): Payer: Medicare Other | Admitting: Internal Medicine

## 2012-07-16 VITALS — BP 130/70 | HR 55 | Ht 75.5 in | Wt 239.0 lb

## 2012-07-16 DIAGNOSIS — J439 Emphysema, unspecified: Secondary | ICD-10-CM

## 2012-07-16 DIAGNOSIS — J438 Other emphysema: Secondary | ICD-10-CM

## 2012-07-16 DIAGNOSIS — G4733 Obstructive sleep apnea (adult) (pediatric): Secondary | ICD-10-CM

## 2012-07-16 MED ORDER — ALBUTEROL SULFATE HFA 108 (90 BASE) MCG/ACT IN AERS: INHALATION_SPRAY | RESPIRATORY_TRACT | Status: DC

## 2012-07-16 NOTE — Progress Notes (Signed)
12/28/10- 78 yoM former smoker, followed for COPD, OSA, complicated by BiPolar, HBP, hx melanoma, DM LOV-12/19/2007 Has had flu vaccine. He had changed his CPAP company to [place], but complains that his mask is old and worn out he can't wear it. Therefore he has not used CPAP in some months. His wife tells him that without CPAP he snores loudly and talks in his sleep. He recognizes he is not sleeping as well. His COPD has been stable with insignificant cough and wheeze. He denies noticing shortness of breath while walking. He has no inhaled medications. CXR-04/09/2009-COPD, no active process.  06/14/11- 62 yoM former smoker, followed for COPD, OSA, complicated by BiPolar, HBP, hx melanoma, DM Cough from time to time: CAT score of 13; Was given QVAR from PCP and can tell slight difference-? aerochamber to use with it. Says he has had productive cough for about a year. Some increased shortness of breath making it hard to say. Gradually worse since last office visit. Dr. Doristine Counter gave Qvar which may help some. Continues CPAP for sleep apnea with good compliance and control. Not Parkinson, but may be related to his bipolar medicines. CXR 02/02/11-  IMPRESSION:  Generalized hyperinflation configuration consistent with COPD. No  acute superimposed abnormality.  Original Report Authenticated By: Crawford Givens, M.D.    07/15/11-  40 yoM former smoker, followed for COPD, OSA, complicated by BiPolar, HBP, hx melanoma, DM  Patient states about the same as last visit. c/o sob with exertion and  a little chest tightness. Denies chest pain, wheezing, and cough.  COPD Assessment Test (CAT) score today 9/40 6 MWT- 99%, 100%, 99%, 510 meters PFT 07/07/2011-mild obstructive airways disease, small airways, insignificant response to bronchodilator. Normal lung volumes, normal diffusion capacity. FEV1/FVC 0.61 For sleep apnea CPAP 9 is comfortable usually. In early June he had fallen and hit his head with fractured nose  so he had to stop using CPAP for a while but is getting started again. He likes [facility] which is associated with decreased cough. Dyspnea on exertion noted pulling empty trash can 75 feet up of mild slope. AeroChamber spacer helps with inhaler.  07/16/12- 78 yoM former smoker, followed for COPD, OSA, complicated by BiPolar, HBP, hx melanoma, DM FOLLOWS FOR: reports CPAP is working, wearing machine approx 6-8 hr per night FOLLOWS FOR: patient reports breathing is doing well-- states since starting [facility] voice has gotten hoarse-- denies any other concerns at this time            Wife here Happy with CPAP 9/ [employer]. Good control. No longer coughing. Dulera 100 2 puffs- hoarse despite aerochamber.  Notes DOE pulling trash can, but not new. Aware heart can contribute to DOE. Can't hold breath to sing in church.   ROS-see HPI Constitutional:   No-   weight loss, night sweats, fevers, chills, fatigue, lassitude. HEENT:   No-  headaches, difficulty swallowing, tooth/dental problems, sore throat,       No-  sneezing, itching, ear ache, nasal congestion, post nasal drip,  CV:  No-   chest pain, orthopnea, PND, swelling in lower extremities, anasarca, dizziness, palpitations Resp: +  shortness of breath with exertion or at rest.              No-productive cough,  No non-productive cough,  No- coughing up of blood.              No-   change in color of mucus.  No- wheezing.   Skin: No-  rash or lesions. GI:  No-   heartburn, indigestion, abdominal pain, nausea, vomiting,  GU:  MS:  + joint pain or swelling. Weakness and falls. Neuro-     tremor Psych:  No- change in mood or affect. No depression or anxiety.  No memory loss.  OBJ General- Alert, Oriented, Affect-appropriate, Distress- none acute, looks well Skin- rash-none, lesions- none, excoriation- none Lymphadenopathy- none Head- atraumatic            Eyes- Gross vision intact, PERRLA, conjunctivae clear secretions            Ears- Hearing,  canals-normal            Nose- Clear, no-Septal dev, mucus, polyps, erosion, perforation             Throat- Mallampati II , mucosa clear , drainage- none, tonsils- atrophic Neck- flexible , trachea midline, no stridor , thyroid nl, carotid no bruit Chest - symmetrical excursion , unlabored           Heart/CV-  Pulse is RRR , no murmur , no gallop  , no rub, nl s1 s2                           - JVD- none , edema- none, stasis changes- none, varices- none           Lung- clear, unlabored, wheeze- none, cough- none , dullness-none, rub- none           Chest wall-  Abd-   HSM- noBr/ Gen/ Rectal- Not done, not indicated Extrem- cyanosis- none, clubbing, none, atrophy- none, strength- nl, using a cane.  Neuro- +resting tremor of hands

## 2012-07-16 NOTE — Assessment & Plan Note (Signed)
Dulera used first for c/o cough, which is improved.  Plan- assess Dulera effect on cough and hoarseness by reducing to 1 puff, twice daily through spacer

## 2012-07-16 NOTE — Patient Instructions (Addendum)
Try reducing your Dulera to 1 puff twice daily through the Aerochamber spacer tube. If your breathing needs it, you can go back up to 2 puffs twice daily at any time.  See if the lower dose is easier on your voice.   Script sent for rescue inhaler albuterol. Try this before church to see if it makes it any easier to manage your breath for singing.

## 2012-07-16 NOTE — Assessment & Plan Note (Signed)
Good compliance and control, no changes indicated now

## 2012-08-28 ENCOUNTER — Other Ambulatory Visit: Payer: Self-pay | Admitting: Internal Medicine

## 2012-11-07 ENCOUNTER — Other Ambulatory Visit: Payer: Self-pay | Admitting: Dermatology

## 2013-01-15 ENCOUNTER — Ambulatory Visit: Payer: Medicare Other | Admitting: Internal Medicine

## 2013-02-08 ENCOUNTER — Ambulatory Visit: Payer: Medicare Other | Admitting: Internal Medicine

## 2013-02-22 ENCOUNTER — Ambulatory Visit (INDEPENDENT_AMBULATORY_CARE_PROVIDER_SITE_OTHER)
Admission: RE | Admit: 2013-02-22 | Discharge: 2013-02-22 | Disposition: A | Discharge status: Home / Self Care | Payer: Medicare Other | Source: Ambulatory Visit | Attending: Internal Medicine | Admitting: Internal Medicine

## 2013-02-22 ENCOUNTER — Encounter: Payer: Self-pay | Admitting: Internal Medicine

## 2013-02-22 ENCOUNTER — Ambulatory Visit (INDEPENDENT_AMBULATORY_CARE_PROVIDER_SITE_OTHER): Payer: Medicare Other | Admitting: Internal Medicine

## 2013-02-22 ENCOUNTER — Encounter (INDEPENDENT_AMBULATORY_CARE_PROVIDER_SITE_OTHER): Payer: Self-pay

## 2013-02-22 VITALS — BP 126/70 | HR 58 | Ht 75.5 in | Wt 247.8 lb

## 2013-02-22 DIAGNOSIS — J309 Allergic rhinitis, unspecified: Secondary | ICD-10-CM

## 2013-02-22 DIAGNOSIS — J438 Other emphysema: Secondary | ICD-10-CM

## 2013-02-22 DIAGNOSIS — G4733 Obstructive sleep apnea (adult) (pediatric): Secondary | ICD-10-CM

## 2013-02-22 DIAGNOSIS — J449 Chronic obstructive pulmonary disease, unspecified: Secondary | ICD-10-CM

## 2013-02-22 DIAGNOSIS — J439 Emphysema, unspecified: Secondary | ICD-10-CM

## 2013-02-22 MED ORDER — CLONAZEPAM 0.5 MG PO TABS: ORAL_TABLET | ORAL | Status: DC

## 2013-02-22 NOTE — Progress Notes (Signed)
12/28/10- 78 yoM former smoker, followed for COPD, OSA, complicated by BiPolar, HBP, hx melanoma, DM LOV-12/19/2007 Has had flu vaccine. He had changed his CPAP company to [place], but complains that his mask is old and worn out he can't wear it. Therefore he has not used CPAP in some months. His wife tells him that without CPAP he snores loudly and talks in his sleep. He recognizes he is not sleeping as well. His COPD has been stable with insignificant cough and wheeze. He denies noticing shortness of breath while walking. He has no inhaled medications. CXR-04/09/2009-COPD, no active process.  06/14/11- 60 yoM former smoker, followed for COPD, OSA, complicated by BiPolar, HBP, hx melanoma, DM Cough from time to time: CAT score of 13; Was given QVAR from PCP and can tell slight difference-? aerochamber to use with it. Says he has had productive cough for about a year. Some increased shortness of breath making it hard to say. Gradually worse since last office visit. Dr. Tollie Pizza gave Qvar which may help some. Continues CPAP for sleep apnea with good compliance and control. Not Parkinson, but may be related to his bipolar medicines. CXR 02/02/11-  IMPRESSION:  Generalized hyperinflation configuration consistent with COPD. No  acute superimposed abnormality.  Original Report Authenticated By: Delane Ginger, M.D.    07/15/11-  75 yoM former smoker, followed for COPD, OSA, complicated by BiPolar, HBP, hx melanoma, DM  Patient states about the same as last visit. c/o sob with exertion and  a little chest tightness. Denies chest pain, wheezing, and cough.  COPD Assessment Test (CAT) score today 9/40 6 MWT- 99%, 100%, 99%, 510 meters PFT 07/07/2011-mild obstructive airways disease, small airways, insignificant response to bronchodilator. Normal lung volumes, normal diffusion capacity. FEV1/FVC 0.61 For sleep apnea CPAP 9 is comfortable usually. In early June he had fallen and hit his head with fractured nose  so he had to stop using CPAP for a while but is getting started again. He likes [facility] which is associated with decreased cough. Dyspnea on exertion noted pulling empty trash can 75 feet up of mild slope. AeroChamber spacer helps with inhaler.  07/16/12- 78 yoM former smoker, followed for COPD, OSA, complicated by BiPolar, HBP, hx melanoma, DM FOLLOWS FOR: reports CPAP is working, wearing machine approx 6-8 hr per night FOLLOWS FOR: patient reports breathing is doing well-- states since starting [facility] voice has gotten hoarse-- denies any other concerns at this time            Wife here Happy with CPAP 9/ [employer]. Good control. No longer coughing. Dulera 100 2 puffs- hoarse despite aerochamber.  Notes DOE pulling trash can, but not new. Aware heart can contribute to DOE. Can't hold breath to sing in church.   02/22/13-  78 yoM former smoker, followed for COPD, rhinitis, OSA, complicated by BiPolar, HBP, hx melanoma, DM FOLLOWS FOR:Pt states his breathing is fair-depends on what he is doing.Pt was told to speak with CY about his OSA and heart issues   CPAP 9/ [employer] Compliant with CPAP but we aren't sure about pressure control. Insurance may want him to change maintenance inhaler. Medications were reviewed. Nasal congestion expect  worse with spring pollen  ROS-see HPI Constitutional:   No-   weight loss, night sweats, fevers, chills, fatigue, lassitude. HEENT:   No-  headaches, difficulty swallowing, tooth/dental problems, sore throat,       No-  sneezing, itching, ear ache, nasal congestion, post nasal drip,  CV:  No-  chest pain, orthopnea, PND, swelling in lower extremities, anasarca, dizziness, palpitations Resp: +  shortness of breath with exertion or at rest.              No-productive cough,  No non-productive cough,  No- coughing up of blood.              No-   change in color of mucus.  +occasional wheezing.   Skin: No-   rash or lesions. GI:  No-   heartburn, indigestion, abdominal  pain, nausea, vomiting,  GU:  MS:  + joint pain or swelling. Weakness and falls. Neuro-     tremor Psych:  No- change in mood or affect. No depression or anxiety.  No memory loss.  OBJ General- Alert, Oriented, Affect-appropriate, Distress- none acute, looks well Skin- rash-none, lesions- none, excoriation- none Lymphadenopathy- none Head- atraumatic            Eyes- Gross vision intact, PERRLA, conjunctivae clear secretions            Ears- Hearing, canals-normal            Nose- Clear, no-Septal dev, mucus, polyps, erosion, perforation             Throat- Mallampati II , mucosa clear , drainage- none, tonsils- atrophic Neck- flexible , trachea midline, no stridor , thyroid nl, carotid no bruit Chest - symmetrical excursion , unlabored           Heart/CV-  Pulse is RRR , no murmur , no gallop  , no rub, nl s1 s2                           - JVD- none , edema- none, stasis changes- none, varices- none           Lung- clear, unlabored, wheeze- none, cough- none , dullness-none, rub- none           Chest wall-  Abd-   HSM- noBr/ Gen/ Rectal- Not done, not indicated Extrem- cyanosis- none, clubbing, none, atrophy- none, strength- nl, using a cane.  Neuro- +resting tremor of hands

## 2013-02-22 NOTE — Patient Instructions (Addendum)
Order- CXR- dx COPD  You can call your insurance to price compare their coverage of Dulera vs Symbicort vs Advair  Script to try clonazepam, taking one about 20 minutes before bedtime each night   Notice if clonazepam reduces the movement and limb jerking at night, and whether it helps the sense of daytime fatigue   Script printed  Order- DME [facility] CPAP pressure and compliance down load dx OSA

## 2013-02-25 ENCOUNTER — Telehealth: Payer: Self-pay | Admitting: Internal Medicine

## 2013-02-25 MED ORDER — FLUTICASONE-SALMETEROL 250-50 MCG/DOSE IN AEPB: 1.0000 | INHALATION_SPRAY | Freq: Two times a day (BID) | RESPIRATORY_TRACT | Status: DC

## 2013-02-25 NOTE — Telephone Encounter (Signed)
Pt is aware of inhaler change. Rx has been sent to [facility].

## 2013-02-25 NOTE — Telephone Encounter (Signed)
Ok D/C [employer]. Change to Advair 250/50, 1 puff then rinse mouth, twice daily, refill prn

## 2013-02-25 NOTE — Telephone Encounter (Signed)
Pt called to let us know the cost of inhalers that CY asked him to check on. Dulera is $129, Symbicort $42 and Advair $42. Pt knows that he will not be able to afford Dulera. According to [employer], they favor Advair due to the cost the pt will pay once he's in the donut hole.  No Known Allergies  CY - please advise between Symbicort and Advair.

## 2013-02-26 NOTE — Progress Notes (Signed)
LMTCB X1 

## 2013-03-17 DIAGNOSIS — J309 Allergic rhinitis, unspecified: Secondary | ICD-10-CM | POA: Insufficient documentation

## 2013-03-17 NOTE — Assessment & Plan Note (Addendum)
Having some difficulty with maintenance of sleep and at least occasional limb movements. Discussed trial of clonazepam Plan-download for pressure and compliance review, clonazepam

## 2013-03-17 NOTE — Assessment & Plan Note (Addendum)
Medication talk. Check insurance formulary for preferred coverage, CXR

## 2013-03-17 NOTE — Assessment & Plan Note (Signed)
Discussed the impact of nasal congestion on CPAP comfort. Discussed upcoming spring pollen season. Plan-sample Dymista for trial, Allegra if needed

## 2013-03-25 ENCOUNTER — Other Ambulatory Visit: Payer: Self-pay | Admitting: Internal Medicine

## 2013-03-25 MED ORDER — CLONAZEPAM 0.5 MG PO TABS: ORAL_TABLET | ORAL | Status: DC

## 2013-03-25 NOTE — Telephone Encounter (Signed)
Per CY-okay to give refill; Rx printed,signed by CY, and given to patient for 90 day supply with 1 refill.

## 2013-04-09 ENCOUNTER — Encounter: Payer: Self-pay | Admitting: Interventional Cardiology

## 2013-04-09 ENCOUNTER — Other Ambulatory Visit: Payer: Self-pay

## 2013-04-09 ENCOUNTER — Ambulatory Visit (INDEPENDENT_AMBULATORY_CARE_PROVIDER_SITE_OTHER): Payer: Medicare Other | Admitting: Interventional Cardiology

## 2013-04-09 VITALS — BP 166/80 | HR 55 | Ht 76.0 in | Wt 249.0 lb

## 2013-04-09 DIAGNOSIS — G4733 Obstructive sleep apnea (adult) (pediatric): Secondary | ICD-10-CM

## 2013-04-09 DIAGNOSIS — J439 Emphysema, unspecified: Secondary | ICD-10-CM

## 2013-04-09 DIAGNOSIS — J438 Other emphysema: Secondary | ICD-10-CM

## 2013-04-09 DIAGNOSIS — I10 Essential (primary) hypertension: Secondary | ICD-10-CM

## 2013-04-09 DIAGNOSIS — I440 Atrioventricular block, first degree: Secondary | ICD-10-CM | POA: Insufficient documentation

## 2013-04-09 DIAGNOSIS — I495 Sick sinus syndrome: Secondary | ICD-10-CM

## 2013-04-09 NOTE — Patient Instructions (Signed)
Your physician recommends that you continue on your current medications as directed. Please refer to the Current Medication list given to you today.  Your physician has recommended that you wear an event monitor.(14 Day Monitor)Event monitors are medical devices that record the heart's electrical activity. Doctors most often [place] these monitors to diagnose arrhythmias. Arrhythmias are problems with the speed or rhythm of the heartbeat. The monitor is a small, portable device. You can wear one while you do your normal daily activities. This is usually used to diagnose what is causing palpitations/syncope (passing out).  Follow up pending monitor results

## 2013-04-09 NOTE — Progress Notes (Signed)
Patient ID: Victor Osborne, male   DOB: April 10, 1932, 79 y.o.   MRN: [id number]    [address], [address] [place], [place]  [address] Phone: [phone] Fax:  [phone]  Date:  04/09/2013   ID:  Victor Osborne, DOB 1932-05-17, MRN [id number]  PCP:  Stephens Shire, MD   ASSESSMENT:  1. Exertional fatigue and dyspnea with no evidence of ischemia by nuclear perfusion and normal LV function. Chronotropic incompetence on exercise treadmill test done within the past 12 months. Rule out sinus node dysfunction 2. Bipolar disorder 3. COPD 4. First degree AV block, significant prolongation of PR  PLAN:  1. Two-week continuous monitor to evaluate heart rate response and rule out excessive bradycardia 2. Consider referral to EP for pacemaker therapy if evidence of excessive bradycardia and/or documentation of chronotropic incompetence.   SUBJECTIVE: Victor Osborne is a 80 y.o. male who has a one-year history of progressive exertional fatigue and dyspnea. He has never had syncope. He denies palpitations. There is no orthopnea, PND, chest pain, or other cardiovascular complaint. Last year after he had his colonoscopy, he was told by the post procedure nurse that he needed to see his cardiologist because of severe bradycardia that developed during the procedure. He did not have clinical consequences. He is known to have sleep apnea. He wears a CPAP mask.   Wt Readings from Last 3 Encounters:  02/22/13 247 lb 12.8 oz (112.401 kg)  07/16/12 239 lb (108.41 kg)  07/15/11 225 lb (102.059 kg)     Past Medical History  Diagnosis Date  . UPJ obstruction, congenital   . COPD (chronic obstructive pulmonary disease)   . Bipolar 1 disorder   . Pneumonia 1970  . Depression   . Thyroid disease   . Hypertension     pt denies but was in medical record  . Skin cancer (melanoma)     Current Outpatient Prescriptions  Medication Sig Dispense Refill  . albuterol (PROVENTIL HFA;VENTOLIN HFA)  108 (90 BASE) MCG/ACT inhaler 1-2 puffs every 6 hours if needed- rescue inhaler  1 Inhaler  6  . Ascorbic Acid (VITAMIN C) 1000 MG tablet Take 1,000 mg by mouth 2 (two) times daily.       Marland Kitchen aspirin 81 MG tablet Take 81 mg by mouth daily.      . carbamazepine (TEGRETOL) 200 MG tablet Take 200 mg by mouth 4 (four) times daily. 1 tablet in the morning and 2 tablets twice a day.      . cholecalciferol (VITAMIN D) 1000 UNITS tablet Take 1,000 Units by mouth daily.        . clonazePAM (KLONOPIN) 0.5 MG tablet 1 at bedtime for sleep  90 tablet  1  . desoximetasone (TOPICORT) 0.25 % cream Apply 1 application topically 2 (two) times daily.      . DULERA 100-5 MCG/ACT AERO USE 2 PUFFS THEN RINSE MOUTH TWICE DAILY  1 Inhaler  6  . Flaxseed, Linseed, (Osborne SEED OIL) 1300 MG CAPS Take 1 capsule by mouth daily.       Marland Kitchen FLUoxetine (PROZAC) 20 MG tablet Take 20 mg by mouth daily.       . Fluticasone-Salmeterol (ADVAIR DISKUS) 250-50 MCG/DOSE AEPB Inhale 1 puff into the lungs 2 (two) times daily.  60 each  prn  . Glucosamine-Chondroit-Vit C-Mn (GLUCOSAMINE 1500 COMPLEX PO) Take 1 tablet by mouth daily.       Marland Kitchen levothyroxine (LEVOTHROID) 125 MCG tablet Take 125 mcg by mouth  daily before breakfast.       . Multiple Vitamin (MULITIVITAMIN WITH MINERALS) TABS Take 1 tablet by mouth daily.      . Omega-3 Fatty Acids (OMEGA-3 CF PO) Take 1,200 mg by mouth daily.       . pravastatin (PRAVACHOL) 40 MG tablet Take 40 mg by mouth at bedtime.        No current facility-administered medications for this visit.    Allergies:   No Known Allergies  Social History:  The patient  reports that he has quit smoking. His smoking use included Cigarettes. He has a 80 pack-year smoking history. He has never used smokeless tobacco. He reports that he does not drink alcohol or use illicit drugs.   ROS:  Please see the history of present illness.   Denies blood in his urine or stool. He has never had a stroke. He has had a fall with a  concussion. He denies lower extremity swelling. There is no history of myocardial infarction. He did smoke until 27 years ago and then discontinued. He denies hemoptysis. No weight loss.   All other systems reviewed and negative.   OBJECTIVE: VS:  There were no vitals taken for this visit. Well nourished, well developed, in no acute distress, appears older than stated age. HEENT: normal Neck: JVD flat with the patient lying at 30. Carotid bruit absent  Cardiac:  normal S1, S2; RRR; no murmur Lungs:  clear to auscultation bilaterally, no wheezing, rhonchi or rales Abd: soft, nontender, no hepatomegaly Ext: Edema no edema. Pulses pulses are 2+ and symmetric Skin: warm and dry Neuro:  CNs 2-12 intact. Tremor at rest is noted.  EKG:  Sinus bradycardia with first-degree AV block (316 ms) QRS duration and pattern is normal       Signed, Illene Labrador III, MD 04/09/2013 8:57 AM

## 2013-04-16 ENCOUNTER — Encounter: Payer: Self-pay | Admitting: *Deleted

## 2013-04-16 ENCOUNTER — Encounter (INDEPENDENT_AMBULATORY_CARE_PROVIDER_SITE_OTHER): Payer: Medicare Other

## 2013-04-16 DIAGNOSIS — I495 Sick sinus syndrome: Secondary | ICD-10-CM

## 2013-04-16 DIAGNOSIS — I440 Atrioventricular block, first degree: Secondary | ICD-10-CM

## 2013-04-16 NOTE — Progress Notes (Signed)
Patient ID: Victor Osborne, male   DOB: Nov 30, 1932, 80 y.o.   MRN: [id number] Zio patch 14 day cardiac monitor applied to patient.

## 2013-04-25 ENCOUNTER — Encounter: Payer: Self-pay | Admitting: Internal Medicine

## 2013-04-25 ENCOUNTER — Ambulatory Visit (INDEPENDENT_AMBULATORY_CARE_PROVIDER_SITE_OTHER): Payer: Medicare Other | Admitting: Internal Medicine

## 2013-04-25 VITALS — BP 128/62 | HR 53 | Ht 75.5 in | Wt 244.2 lb

## 2013-04-25 DIAGNOSIS — G4733 Obstructive sleep apnea (adult) (pediatric): Secondary | ICD-10-CM

## 2013-04-25 DIAGNOSIS — J438 Other emphysema: Secondary | ICD-10-CM

## 2013-04-25 DIAGNOSIS — J439 Emphysema, unspecified: Secondary | ICD-10-CM

## 2013-04-25 NOTE — Progress Notes (Signed)
12/28/10- 78 yoM former smoker, followed for COPD, OSA, complicated by BiPolar, HBP, hx melanoma, DM LOV-12/19/2007 Has had flu vaccine. He had changed his CPAP company to [employer], but complains that his mask is old and worn out he can't wear it. Therefore he has not used CPAP in some months. His wife tells him that without CPAP he snores loudly and talks in his sleep. He recognizes he is not sleeping as well. His COPD has been stable with insignificant cough and wheeze. He denies noticing shortness of breath while walking. He has no inhaled medications. CXR-04/09/2009-COPD, no active process.  06/14/11- 49 yoM former smoker, followed for COPD, OSA, complicated by BiPolar, HBP, hx melanoma, DM Cough from time to time: CAT score of 13; Was given QVAR from PCP and can tell slight difference-? aerochamber to use with it. Says he has had productive cough for about a year. Some increased shortness of breath making it hard to say. Gradually worse since last office visit. Dr. Tollie Pizza gave Qvar which may help some. Continues CPAP for sleep apnea with good compliance and control. Not Parkinson, but may be related to his bipolar medicines. CXR 02/02/11-  IMPRESSION:  Generalized hyperinflation configuration consistent with COPD. No  acute superimposed abnormality.  Original Report Authenticated By: Delane Ginger, M.D.    07/15/11-  31 yoM former smoker, followed for COPD, OSA, complicated by BiPolar, HBP, hx melanoma, DM  Patient states about the same as last visit. c/o sob with exertion and  a little chest tightness. Denies chest pain, wheezing, and cough.  COPD Assessment Test (CAT) score today 9/40 6 MWT- 99%, 100%, 99%, 510 meters PFT 07/07/2011-mild obstructive airways disease, small airways, insignificant response to bronchodilator. Normal lung volumes, normal diffusion capacity. FEV1/FVC 0.61 For sleep apnea CPAP 9 is comfortable usually. In early June he had fallen and hit his head with fractured nose  so he had to stop using CPAP for a while but is getting started again. He likes [facility] which is associated with decreased cough. Dyspnea on exertion noted pulling empty trash can 75 feet up of mild slope. AeroChamber spacer helps with inhaler.  07/16/12- 78 yoM former smoker, followed for COPD, OSA, complicated by BiPolar, HBP, hx melanoma, DM FOLLOWS FOR: reports CPAP is working, wearing machine approx 6-8 hr per night FOLLOWS FOR: patient reports breathing is doing well-- states since starting [facility] voice has gotten hoarse-- denies any other concerns at this time            Wife here Happy with CPAP 9/ [employer]. Good control. No longer coughing. Dulera 100 2 puffs- hoarse despite aerochamber.  Notes DOE pulling trash can, but not new. Aware heart can contribute to DOE. Can't hold breath to sing in church.   02/22/13-  78 yoM former smoker, followed for COPD, rhinitis, OSA, complicated by BiPolar, HBP, hx melanoma, DM FOLLOWS FOR:Pt states his breathing is fair-depends on what he is doing.Pt was told to speak with CY about his OSA and heart issues   CPAP 9/ [employer] Compliant with CPAP but we aren't sure about pressure control. Insurance may want him to change maintenance inhaler. Medications were reviewed. Nasal congestion expect  worse with spring pollen  04/25/13- 80 yoM former smoker, followed for COPD, rhinitis, OSA, complicated by BiPolar, HBP, hx melanoma, DM FOLLOWS FOR:sob same,denies cough or wheeze,wearing hrt. monitor for 2 wks. now,can't afford Advair,no cp or tightness,doing good with CPAP 9/ [employer] wears avg. 8 hrs.,needs new nasal pillows Wearing recorder for cardiology because  heart rate limits exertion. Does not miss Advair and denies cough or wheeze. Rare need for rescue inhaler CXR 02/22/13 IMPRESSION:  No acute cardiopulmonary disease. COPD. No change from the prior  study.  Electronically Signed  By: Lajean Manes M.D.  On: 02/22/2013 16:24  ROS-see HPI Constitutional:   No-    weight loss, night sweats, fevers, chills, fatigue, lassitude. HEENT:   No-  headaches, difficulty swallowing, tooth/dental problems, sore throat,       No-  sneezing, itching, ear ache, nasal congestion, post nasal drip,  CV:  No-   chest pain, orthopnea, PND, swelling in lower extremities, anasarca, dizziness, palpitations Resp: +  shortness of breath with exertion or at rest.              No-productive cough,  No non-productive cough,  No- coughing up of blood.              No-   change in color of mucus.  No- wheezing.   Skin: No-   rash or lesions. GI:  No-   heartburn, indigestion, abdominal pain, nausea, vomiting,  GU:  MS:  + joint pain or swelling. Weakness and falls. Neuro-     tremor Psych:  No- change in mood or affect. No depression or anxiety.  No memory loss.  OBJ General- Alert, Oriented, Affect-appropriate, Distress- none acute, looks well Skin- rash-none, lesions- none, excoriation- none Lymphadenopathy- none Head- atraumatic            Eyes- Gross vision intact, PERRLA, conjunctivae clear secretions, + R black eye "fell"            Ears- Hearing, canals-normal            Nose- Clear, no-Septal dev, mucus, polyps, erosion, perforation             Throat- Mallampati II , mucosa clear , drainage- none, tonsils- atrophic Neck- flexible , trachea midline, no stridor , thyroid nl, carotid no bruit Chest - symmetrical excursion , unlabored           Heart/CV-  Pulse is RRR , no murmur , no gallop  , no rub, nl s1 s2                           - JVD- none , edema- none, stasis changes- none, varices- none           Lung- +few crackles right base, unlabored, wheeze- none, cough- none , dullness-none,                      rub- none           Chest wall-  Abd- HSM- not indicated Br/ Gen/ Rectal- Not done, not indicated Extrem- cyanosis- none, clubbing, none, atrophy- none, strength- nl, using a cane.  Neuro- +resting tremor of hands

## 2013-04-25 NOTE — Patient Instructions (Signed)
We can continue CPAP 9/ [employer]  Order- DME [facility]ue[facility] Romans- replacement CPAP mask of choice   Dx OSA  Ok to watch your breathing off Advair

## 2013-05-14 ENCOUNTER — Telehealth: Payer: Self-pay

## 2013-05-14 NOTE — Telephone Encounter (Signed)
pt aware of cardiac monitor results.NSR and SB.No pauses.all HR less 104bpm.refer to EP for consideration for a pcer for chronotropic incompetence.pt adv a sch will call him ro sch an appt with one of the EP Docs.pt agreeable with plan and verbalized understanding.

## 2013-05-26 ENCOUNTER — Encounter: Payer: Self-pay | Admitting: Internal Medicine

## 2013-05-26 NOTE — Assessment & Plan Note (Signed)
Good compliance and control 

## 2013-05-26 NOTE — Assessment & Plan Note (Signed)
Feels well controlled

## 2013-06-06 ENCOUNTER — Other Ambulatory Visit: Payer: Self-pay | Admitting: Dermatology

## 2013-06-11 ENCOUNTER — Encounter: Payer: Self-pay | Admitting: *Deleted

## 2013-06-11 ENCOUNTER — Encounter: Payer: Self-pay | Admitting: Internal Medicine

## 2013-06-11 ENCOUNTER — Ambulatory Visit (INDEPENDENT_AMBULATORY_CARE_PROVIDER_SITE_OTHER): Payer: Medicare Other | Admitting: Internal Medicine

## 2013-06-11 VITALS — BP 158/84 | HR 57 | Ht 75.75 in | Wt 234.0 lb

## 2013-06-11 DIAGNOSIS — I495 Sick sinus syndrome: Secondary | ICD-10-CM

## 2013-06-11 DIAGNOSIS — I440 Atrioventricular block, first degree: Secondary | ICD-10-CM

## 2013-06-11 LAB — CBC WITH DIFFERENTIAL/PLATELET
BASOS ABS: 0 10*3/uL (ref 0.0–0.1)
BASOS PCT: 0.2 % (ref 0.0–3.0)
EOS PCT: 6.8 % — AB (ref 0.0–5.0)
Eosinophils Absolute: 0.3 10*3/uL (ref 0.0–0.7)
HEMATOCRIT: 43 % (ref 39.0–52.0)
Hemoglobin: 14.3 g/dL (ref 13.0–17.0)
LYMPHS ABS: 0.6 10*3/uL — AB (ref 0.7–4.0)
Lymphocytes Relative: 12.5 % (ref 12.0–46.0)
MCHC: 33.2 g/dL (ref 30.0–36.0)
MCV: 97.9 fl (ref 78.0–100.0)
MONO ABS: 0.6 10*3/uL (ref 0.1–1.0)
Monocytes Relative: 12 % (ref 3.0–12.0)
NEUTROS ABS: 3.4 10*3/uL (ref 1.4–7.7)
NEUTROS PCT: 68.5 % (ref 43.0–77.0)
Platelets: 186 10*3/uL (ref 150.0–400.0)
RBC: 4.39 Mil/uL (ref 4.22–5.81)
RDW: 13.9 % (ref 11.5–15.5)
WBC: 5 10*3/uL (ref 4.0–10.5)

## 2013-06-11 LAB — BASIC METABOLIC PANEL
BUN: 21 mg/dL (ref 6–23)
CHLORIDE: 110 meq/L (ref 96–112)
CO2: 26 meq/L (ref 19–32)
CREATININE: 1.3 mg/dL (ref 0.4–1.5)
Calcium: 9 mg/dL (ref 8.4–10.5)
GFR: 58.74 mL/min — ABNORMAL LOW (ref >60.00)
Glucose, Bld: 86 mg/dL (ref 70–99)
POTASSIUM: 4.1 meq/L (ref 3.5–5.1)
Sodium: 144 mEq/L (ref 135–145)

## 2013-06-11 NOTE — Patient Instructions (Signed)
Your physician has recommended that you have a pacemaker inserted. A pacemaker is a small device that is placed under the skin of your chest or abdomen to help control abnormal heart rhythms. This device uses electrical pulses to prompt the heart to beat at a normal rate. Pacemakers are used to treat heart rhythms that are too slow. Wire (leads) are attached to the pacemaker that goes into the chambers of you heart. This is done in the hospital and usually requires and overnight stay. Please see the instruction sheet given to you today for more information.   See instruction sheet for procedure

## 2013-06-11 NOTE — Assessment & Plan Note (Signed)
He has sinus node dysfunction by exercise treadmill with his HR's not getting above 90 and symptomatic bradycardia with no reversible causes. I have discussed the treatment options and have recommended a DDD PM with minute ventilation. This will be scheduled in the coming weeks.

## 2013-06-11 NOTE — Progress Notes (Signed)
HPI Victor Osborne is referred today by Dr. Tamala Julian for evaluation of chronotropic incompetence. He is a very pleasant 80 yo man with a one year history of worsening dyspnea with exertion. A year ago he had an exercise test where he was limited by a lack of heart rate response. He was only able to achieve a peak heart rate of 90/min despite exercising on a Bruce protocol. He is very active and notes that when he is outside working he quickly gets tired and has to stop what he is doing and rest. He has not had frank syncope. He wore a 2 week monitor and had no prolonged episodes of bradycardia. He is on no AV nodal blocking agents or reversible causes of bradycardia. No Known Allergies   Current Outpatient Prescriptions  Medication Sig Dispense Refill  . albuterol (PROVENTIL HFA;VENTOLIN HFA) 108 (90 BASE) MCG/ACT inhaler 1-2 puffs every 6 hours if needed- rescue inhaler  1 Inhaler  6  . Ascorbic Acid (VITAMIN C) 1000 MG tablet Take 1,000 mg by mouth 2 (two) times daily.       Marland Kitchen aspirin 81 MG tablet Take 81 mg by mouth daily.      . carbamazepine (TEGRETOL) 200 MG tablet Take 200 mg by mouth 4 (four) times daily. 1 tablet in the morning and 2 tablets twice a day.      . cholecalciferol (VITAMIN D) 1000 UNITS tablet Take 1,000 Units by mouth daily.        Marland Kitchen desoximetasone (TOPICORT) 0.25 % cream Apply 1 application topically 2 (two) times daily.      . Flaxseed, Linseed, (FLAX SEED OIL) 1300 MG CAPS Take 1 capsule by mouth daily.       Marland Kitchen FLUoxetine (PROZAC) 20 MG tablet Take 20 mg by mouth daily.       Marland Kitchen levothyroxine (LEVOTHROID) 125 MCG tablet Take 125 mcg by mouth daily before breakfast.       . Multiple Vitamin (MULITIVITAMIN WITH MINERALS) TABS Take 1 tablet by mouth daily.      . Omega-3 Fatty Acids (OMEGA-3 CF PO) Take 1,200 mg by mouth daily.       . pravastatin (PRAVACHOL) 40 MG tablet Take 40 mg by mouth at bedtime.        No current facility-administered medications for this visit.       Past Medical History  Diagnosis Date  . UPJ obstruction, congenital   . COPD (chronic obstructive pulmonary disease)   . Bipolar 1 disorder   . Pneumonia 1970  . Depression   . Thyroid disease   . Hypertension     pt denies but was in medical record  . Skin cancer (melanoma)   . First degree AV block   . Unspecified hypothyroidism   . Myalgia and myositis, unspecified   . Obesity   . Hypoglycemia, unspecified     ROS:   All systems reviewed and negative except as noted in the HPI.   Past Surgical History  Procedure Laterality Date  . Appendectomy  1939  . Urethra surgery  2010    reconstruction  . Right big toe  surgery  2009  . Malignant melanoma removed from right forehead  2008  . Transurethral resection of prostate  02/04/2011    Procedure: TRANSURETHRAL RESECTION OF THE PROSTATE (TURP);  Surgeon: Dutch Gray, MD;  Location: [facility];  Service: Urology;  Laterality: N/A;     Family History  Problem Relation Age of Onset  .  Cancer Mother      History   Social History  . Marital Status: Married    Spouse Name: N/A    Number of Children: N/A  . Years of Education: N/A   Occupational History  . Not on file.   Social History Main Topics  . Smoking status: Former Smoker -- 2.00 packs/day for 40 years    Types: Cigarettes  . Smokeless tobacco: Never Used  . Alcohol Use: No  . Drug Use: No  . Sexual Activity: Not on file   Other Topics Concern  . Not on file   Social History Narrative  . No narrative on file     BP 158/84  Pulse 57  Ht 6' 3.75" (1.924 m)  Wt 234 lb (106.142 kg)  BMI 28.67 kg/m2  Physical Exam:  Stable appearing 80 yo man with an upper extremity bilateral tremor, worse on left, NAD HEENT: Unremarkable Neck:  No JVD, no thyromegally Back:  No CVA tenderness Lungs:  Clear with no wheezes HEART:  Regular rate rhythm, no murmurs, no rubs, no clicks Abd:  soft, positive bowel sounds, no organomegally, no rebound, no  guarding Ext:  2 plus pulses, no edema, no cyanosis, no clubbing Skin:  No rashes no nodules Neuro:  CN II through XII intact, motor grossly intact  EKG - nsr with long first degree AV block and a PR interval of 300.   DEVICE  Normal device function.  See PaceArt for details.   Assess/Plan:

## 2013-06-18 MED ORDER — SODIUM CHLORIDE 0.9 % IR SOLN
80.0000 mg | Status: DC
  Filled 2013-06-18: qty 2

## 2013-06-18 MED ORDER — CEFAZOLIN SODIUM-DEXTROSE 2-3 GM-% IV SOLR
2.0000 g | INTRAVENOUS | Status: DC
  Filled 2013-06-18: qty 50

## 2013-06-19 ENCOUNTER — Encounter (HOSPITAL_COMMUNITY): Payer: Self-pay | Admitting: *Deleted

## 2013-06-19 ENCOUNTER — Ambulatory Visit (HOSPITAL_COMMUNITY)
Admission: RE | Admit: 2013-06-19 | Discharge: 2013-06-20 | Disposition: A | Discharge status: Home / Self Care | Payer: Medicare Other | Source: Ambulatory Visit | Attending: Internal Medicine | Admitting: Internal Medicine

## 2013-06-19 ENCOUNTER — Encounter (HOSPITAL_COMMUNITY)
Admission: RE | Disposition: A | Discharge status: Home / Self Care | Payer: Self-pay | Source: Ambulatory Visit | Attending: Internal Medicine

## 2013-06-19 DIAGNOSIS — E669 Obesity, unspecified: Secondary | ICD-10-CM | POA: Insufficient documentation

## 2013-06-19 DIAGNOSIS — I10 Essential (primary) hypertension: Secondary | ICD-10-CM

## 2013-06-19 DIAGNOSIS — I440 Atrioventricular block, first degree: Secondary | ICD-10-CM | POA: Insufficient documentation

## 2013-06-19 DIAGNOSIS — E785 Hyperlipidemia, unspecified: Secondary | ICD-10-CM

## 2013-06-19 DIAGNOSIS — E039 Hypothyroidism, unspecified: Secondary | ICD-10-CM | POA: Insufficient documentation

## 2013-06-19 DIAGNOSIS — J4489 Other specified chronic obstructive pulmonary disease: Secondary | ICD-10-CM | POA: Insufficient documentation

## 2013-06-19 DIAGNOSIS — F319 Bipolar disorder, unspecified: Secondary | ICD-10-CM | POA: Insufficient documentation

## 2013-06-19 DIAGNOSIS — G4730 Sleep apnea, unspecified: Secondary | ICD-10-CM | POA: Insufficient documentation

## 2013-06-19 DIAGNOSIS — Z6828 Body mass index (BMI) 28.0-28.9, adult: Secondary | ICD-10-CM | POA: Insufficient documentation

## 2013-06-19 DIAGNOSIS — IMO0001 Reserved for inherently not codable concepts without codable children: Secondary | ICD-10-CM | POA: Insufficient documentation

## 2013-06-19 DIAGNOSIS — I495 Sick sinus syndrome: Secondary | ICD-10-CM | POA: Insufficient documentation

## 2013-06-19 DIAGNOSIS — F3176 Bipolar disorder, in full remission, most recent episode depressed: Secondary | ICD-10-CM

## 2013-06-19 DIAGNOSIS — E119 Type 2 diabetes mellitus without complications: Secondary | ICD-10-CM

## 2013-06-19 DIAGNOSIS — J309 Allergic rhinitis, unspecified: Secondary | ICD-10-CM

## 2013-06-19 DIAGNOSIS — J449 Chronic obstructive pulmonary disease, unspecified: Secondary | ICD-10-CM | POA: Insufficient documentation

## 2013-06-19 DIAGNOSIS — J439 Emphysema, unspecified: Secondary | ICD-10-CM

## 2013-06-19 DIAGNOSIS — R7309 Other abnormal glucose: Secondary | ICD-10-CM | POA: Insufficient documentation

## 2013-06-19 DIAGNOSIS — G4733 Obstructive sleep apnea (adult) (pediatric): Secondary | ICD-10-CM

## 2013-06-19 DIAGNOSIS — Z85820 Personal history of malignant melanoma of skin: Secondary | ICD-10-CM

## 2013-06-19 DIAGNOSIS — Z87891 Personal history of nicotine dependence: Secondary | ICD-10-CM | POA: Insufficient documentation

## 2013-06-19 HISTORY — PX: PERMANENT PACEMAKER INSERTION: SHX5480

## 2013-06-19 LAB — SURGICAL PCR SCREEN
MRSA, PCR: NEGATIVE
Staphylococcus aureus: NEGATIVE

## 2013-06-19 SURGERY — PERMANENT PACEMAKER INSERTION
Anesthesia: LOCAL

## 2013-06-19 MED ORDER — MIDAZOLAM HCL 5 MG/5ML IJ SOLN
INTRAMUSCULAR | Status: AC
  Filled 2013-06-19: qty 5

## 2013-06-19 MED ORDER — CEFAZOLIN SODIUM-DEXTROSE 2-3 GM-% IV SOLR
2.0000 g | Freq: Four times a day (QID) | INTRAVENOUS | Status: AC
  Administered 2013-06-19 – 2013-06-20 (×3): 2 g via INTRAVENOUS
  Filled 2013-06-19 (×3): qty 50

## 2013-06-19 MED ORDER — ADULT MULTIVITAMIN W/MINERALS CH
1.0000 | ORAL_TABLET | Freq: Every day | ORAL | Status: DC
  Administered 2013-06-19 – 2013-06-20 (×2): 1 via ORAL
  Filled 2013-06-19 (×2): qty 1

## 2013-06-19 MED ORDER — ACETAMINOPHEN 325 MG PO TABS: 325.0000 mg | ORAL_TABLET | ORAL | Status: DC | PRN

## 2013-06-19 MED ORDER — VITAMIN D3 25 MCG (1000 UNIT) PO TABS
1000.0000 [IU] | ORAL_TABLET | Freq: Every day | ORAL | Status: DC
  Administered 2013-06-19 – 2013-06-20 (×2): 1000 [IU] via ORAL
  Filled 2013-06-19 (×2): qty 1

## 2013-06-19 MED ORDER — LEVOTHYROXINE SODIUM 125 MCG PO TABS
125.0000 ug | ORAL_TABLET | Freq: Every day | ORAL | Status: DC
  Administered 2013-06-20: 125 ug via ORAL
  Filled 2013-06-19 (×2): qty 1

## 2013-06-19 MED ORDER — FLUOXETINE HCL 20 MG PO TABS
20.0000 mg | ORAL_TABLET | Freq: Every day | ORAL | Status: DC
  Administered 2013-06-19 – 2013-06-20 (×2): 20 mg via ORAL
  Filled 2013-06-19 (×2): qty 1

## 2013-06-19 MED ORDER — SODIUM CHLORIDE 0.9 % IV SOLN
INTRAVENOUS | Status: DC
  Administered 2013-06-19: 50 mL/h via INTRAVENOUS

## 2013-06-19 MED ORDER — LIDOCAINE HCL (PF) 1 % IJ SOLN
INTRAMUSCULAR | Status: AC
Start: 2013-06-19 — End: 2013-06-19
  Filled 2013-06-19: qty 30

## 2013-06-19 MED ORDER — FENTANYL CITRATE 0.05 MG/ML IJ SOLN
INTRAMUSCULAR | Status: AC
  Filled 2013-06-19: qty 2

## 2013-06-19 MED ORDER — CARBAMAZEPINE 200 MG PO TABS
200.0000 mg | ORAL_TABLET | Freq: Four times a day (QID) | ORAL | Status: DC
  Administered 2013-06-19 – 2013-06-20 (×3): 200 mg via ORAL
  Filled 2013-06-19 (×6): qty 1

## 2013-06-19 MED ORDER — HEPARIN (PORCINE) IN NACL 2-0.9 UNIT/ML-% IJ SOLN
INTRAMUSCULAR | Status: AC
  Filled 2013-06-19: qty 500

## 2013-06-19 MED ORDER — LIDOCAINE HCL (PF) 1 % IJ SOLN
INTRAMUSCULAR | Status: AC
  Filled 2013-06-19: qty 30

## 2013-06-19 MED ORDER — CARBIDOPA-LEVODOPA 25-100 MG PO TABS
1.0000 | ORAL_TABLET | Freq: Three times a day (TID) | ORAL | Status: DC
  Administered 2013-06-19 – 2013-06-20 (×3): 1 via ORAL
  Filled 2013-06-19 (×5): qty 1

## 2013-06-19 MED ORDER — SIMVASTATIN 5 MG PO TABS
5.0000 mg | ORAL_TABLET | Freq: Every day | ORAL | Status: DC
  Administered 2013-06-19: 5 mg via ORAL
  Filled 2013-06-19 (×2): qty 1

## 2013-06-19 MED ORDER — MUPIROCIN 2 % EX OINT
TOPICAL_OINTMENT | CUTANEOUS | Status: AC
  Administered 2013-06-19: 1 via NASAL
  Filled 2013-06-19: qty 22

## 2013-06-19 MED ORDER — MUPIROCIN CALCIUM 2 % EX CREA
TOPICAL_CREAM | Freq: Two times a day (BID) | CUTANEOUS | Status: DC
  Administered 2013-06-19: 1 via TOPICAL
  Filled 2013-06-19: qty 15

## 2013-06-19 NOTE — CV Procedure (Signed)
Electrophysiology procedure note  Procedure: Insertion of a dual-chamber pacemaker  Indication: Symptomatic chronotropic incompetence with concomitant AV conduction system disease  Description of the procedure: After informed consent was obtained, the patient was taken to the diagnostic electrophysiology laboratory in the fasting state. After the usual preparation and draping, intravenous Versed and fentanyl were utilized for sedation. 30 cc of lidocaine was infiltrated into the left infraclavicular region. A 5 cm incision was carried out. Electrocautery was utilized to dissect down to the fascial plane. The left subclavian vein was punctured twice. The Guidant model I611193 active fixation pacing lead, serial #[id number] was inserted into the right ventricle, and the guide model 4136, active fixation pacing lead, serial #[id number] was advanced into the right atrium. Mapping was carried out first in the right ventricle. At the final site, the R waves measured 9 mV. Active fixation lead demonstrated a pacing impedance of 900 ohms, and the pacing threshold was 1 V at 0.4 ms. 10 V pacing did not stimulate the diaphragm. With the ventricular lead in satisfactory position, attention was turned to placement of the atrial lead. At the final site, the P waves measured 3 mV, the pacing impedance was 600 ohms, and the threshold was 1 V at 0.5 ms. Temple patient denies to my diaphragm. A satisfactory injury currently present with active fixation lead. At this point the leads were secured to the subpectoralis fashion with a figure-of-eight silk suture. The sewing sleeve was secured with silk suture. Electrocautery was utilized to make a subcutaneous pocket. Antibiotic irrigation was utilized in gait the pocket. A [employer] dual-chamber pacemaker, serial [id number] was connected to the atrial and ventricular pacing leads in place back in the subcutaneous pocket. The pocket was irrigated with antibiotic irrigation. 2 layers  of Vicryl suture were then utilized to sew up the skin. Benzoin and Steri-Strips for painted on the wound, a pressure dressing placed, and the patient returned to his room in satisfactory condition.   Complications: There were no immediate procedure complications   Results in conclusion: Successful insertion of a Chemical engineer dual-chamber pacemaker in a patient with symptomatic sinus node dysfunction, chronotropic incompetence, with a maximum exercise heart rate of 80 beats per minute, in the setting of marked AV conduction system disease, with a PR interval of 350 ms.   Cristopher Peru, M.D.

## 2013-06-19 NOTE — Interval H&P Note (Signed)
History and Physical Interval Note:  06/19/2013 1:09 PM  Victor Osborne  has presented today for surgery, with the diagnosis of sss and chronotropic incompetence  The various methods of treatment have been discussed with the patient and family. After consideration of risks, benefits and other options for treatment, the patient has consented to  Procedure(s): PERMANENT PACEMAKER INSERTION (N/A) as a surgical intervention .  The patient's history has been reviewed, patient examined, no change in status, stable for surgery.  I have reviewed the patient's chart and labs.  Questions were answered to the patient's satisfaction.     [name].D.

## 2013-06-19 NOTE — H&P (View-Only) (Signed)
HPI [name] is referred today by Dr. Tamala Julian for evaluation of chronotropic incompetence. He is a very pleasant 80 yo man with a one year history of worsening dyspnea with exertion. A year ago he had an exercise test where he was limited by a lack of heart rate response. He was only able to achieve a peak heart rate of 90/min despite exercising on a Bruce protocol. He is very active and notes that when he is outside working he quickly gets tired and has to stop what he is doing and rest. He has not had frank syncope. He wore a 2 week monitor and had no prolonged episodes of bradycardia. He is on no AV nodal blocking agents or reversible causes of bradycardia. No Known Allergies   Current Outpatient Prescriptions  Medication Sig Dispense Refill  . albuterol (PROVENTIL HFA;VENTOLIN HFA) 108 (90 BASE) MCG/ACT inhaler 1-2 puffs every 6 hours if needed- rescue inhaler  1 Inhaler  6  . Ascorbic Acid (VITAMIN C) 1000 MG tablet Take 1,000 mg by mouth 2 (two) times daily.       Marland Kitchen aspirin 81 MG tablet Take 81 mg by mouth daily.      . carbamazepine (TEGRETOL) 200 MG tablet Take 200 mg by mouth 4 (four) times daily. 1 tablet in the morning and 2 tablets twice a day.      . cholecalciferol (VITAMIN D) 1000 UNITS tablet Take 1,000 Units by mouth daily.        Marland Kitchen desoximetasone (TOPICORT) 0.25 % cream Apply 1 application topically 2 (two) times daily.      . Flaxseed, Linseed, (FLAX SEED OIL) 1300 MG CAPS Take 1 capsule by mouth daily.       Marland Kitchen FLUoxetine (PROZAC) 20 MG tablet Take 20 mg by mouth daily.       Marland Kitchen levothyroxine (LEVOTHROID) 125 MCG tablet Take 125 mcg by mouth daily before breakfast.       . Multiple Vitamin (MULITIVITAMIN WITH MINERALS) TABS Take 1 tablet by mouth daily.      . Omega-3 Fatty Acids (OMEGA-3 CF PO) Take 1,200 mg by mouth daily.       . pravastatin (PRAVACHOL) 40 MG tablet Take 40 mg by mouth at bedtime.        No current facility-administered medications for this visit.       Past Medical History  Diagnosis Date  . UPJ obstruction, congenital   . COPD (chronic obstructive pulmonary disease)   . Bipolar 1 disorder   . Pneumonia 1970  . Depression   . Thyroid disease   . Hypertension     pt denies but was in medical record  . Skin cancer (melanoma)   . First degree AV block   . Unspecified hypothyroidism   . Myalgia and myositis, unspecified   . Obesity   . Hypoglycemia, unspecified     ROS:   All systems reviewed and negative except as noted in the HPI.   Past Surgical History  Procedure Laterality Date  . Appendectomy  1939  . Urethra surgery  2010    reconstruction  . Right big toe  surgery  2009  . Malignant melanoma removed from right forehead  2008  . Transurethral resection of prostate  02/04/2011    Procedure: TRANSURETHRAL RESECTION OF THE PROSTATE (TURP);  Surgeon: Dutch Gray, MD;  Location: [facility];  Service: Urology;  Laterality: N/A;     Family History  Problem Relation Age of Onset  .  Cancer Mother      History   Social History  . Marital Status: Married    Spouse Name: N/A    Number of Children: N/A  . Years of Education: N/A   Occupational History  . Not on file.   Social History Main Topics  . Smoking status: Former Smoker -- 2.00 packs/day for 40 years    Types: Cigarettes  . Smokeless tobacco: Never Used  . Alcohol Use: No  . Drug Use: No  . Sexual Activity: Not on file   Other Topics Concern  . Not on file   Social History Narrative  . No narrative on file     BP 158/84  Pulse 57  Ht 6' 3.75" (1.924 m)  Wt 234 lb (106.142 kg)  BMI 28.67 kg/m2  Physical Exam:  Stable appearing 80 yo man with an upper extremity bilateral tremor, worse on left, NAD HEENT: Unremarkable Neck:  No JVD, no thyromegally Back:  No CVA tenderness Lungs:  Clear with no wheezes HEART:  Regular rate rhythm, no murmurs, no rubs, no clicks Abd:  soft, positive bowel sounds, no organomegally, no rebound, no  guarding Ext:  2 plus pulses, no edema, no cyanosis, no clubbing Skin:  No rashes no nodules Neuro:  CN II through XII intact, motor grossly intact  EKG - nsr with long first degree AV block and a PR interval of 300.   DEVICE  Normal device function.  See PaceArt for details.   Assess/Plan:

## 2013-06-20 ENCOUNTER — Ambulatory Visit (HOSPITAL_COMMUNITY): Payer: Medicare Other

## 2013-06-20 DIAGNOSIS — I440 Atrioventricular block, first degree: Secondary | ICD-10-CM

## 2013-06-20 NOTE — Discharge Summary (Signed)
ELECTROPHYSIOLOGY PROCEDURE DISCHARGE SUMMARY    Patient ID: Victor Osborne,  MRN: [id number], DOB/AGE: [id number] y.o.  Admit date: 06/19/2013 Discharge date: 06/20/2013  Primary Care Physician: Stephens Shire, MD Primary Cardiologist: Tamala Julian Electrophysiologist: Lovena Le  Primary Discharge Diagnosis:  Symptomatic chronotropic incompetence with 1st degree AV block status post pacemaker implantation this admission  Secondary Discharge Diagnosis:  1.  COPD 2.  Sleep apnea 3.  Hypertension 4.  Obesity  No Known Allergies   Procedures This Admission:  1.  Implantation of a dual chamber pacemaker on 06-19-2013 by Dr Lovena Le.  The patient received a BSX pacemaker with model number 7824 right atrial lead and model number 2353 right ventricular lead.  There were no early apparent complications.  2.  CXR on 06-20-2013 demonstrated no PTX  Brief HPI: Victor Osborne is a 80 y.o. male with a past medical history as listed above.  He developed progressive exercise intolerance and fatigue and underwent GXT testing which demonstrated chronotropic incompetence.  He was referred to EP for consideration of pacemaker implantation. Risks, benefits, and alternatives were reviewed with the patient who wished to proceed.   Hospital Course:  The patient was admitted and underwent implantation of a BSX dual chamber pacemaker with details as outlined above.   He was monitored on telemetry overnight which demonstrated AV pacing.  Left chest was without hematoma or ecchymosis.  The device was interrogated and found to be functioning normally.  CXR was obtained and demonstrated no pneumothorax status post device implantation.  Wound care, arm mobility, and restrictions were reviewed with the patient.  Dr Lovena Le examined the patient and considered them stable for discharge to home.    Discharge Vitals: Blood pressure 129/70, pulse 59, temperature 97.9 F (36.6 C), temperature source Oral, resp. rate 18,  height 6' 3.25" (1.911 m), weight 227 lb (102.967 kg), SpO2 95.00%.   Labs:   Lab Results  Component Value Date   WBC 5.0 06/11/2013   HGB 14.3 06/11/2013   HCT 43.0 06/11/2013   MCV 97.9 06/11/2013   PLT 186.0 06/11/2013   No results found for this basename: NA, K, CL, CO2, BUN, CREATININE, CALCIUM, LABALBU, PROT, BILITOT, ALKPHOS, ALT, AST, GLUCOSE,  in the last 168 hours   Discharge Medications:    Medication List    ASK your doctor about these medications       albuterol 108 (90 BASE) MCG/ACT inhaler  Commonly known as:  PROVENTIL HFA;VENTOLIN HFA  1-2 puffs every 6 hours if needed- rescue inhaler     alclomethasone 0.05 % ointment  Commonly known as:  ACLOVATE  Apply 1 application topically 2 (two) times daily as needed (for rash).     aspirin 81 MG tablet  Take 81 mg by mouth daily.     carbamazepine 200 MG tablet  Commonly known as:  TEGRETOL  Take 200 mg by mouth 4 (four) times daily. 1 tablet in the morning and 2 tablets twice a day.     carbidopa-levodopa 25-100 MG per tablet  Commonly known as:  SINEMET IR  Take 1 tablet by mouth 3 (three) times daily.     cholecalciferol 1000 UNITS tablet  Commonly known as:  VITAMIN D  Take 1,000 Units by mouth daily.     desoximetasone 0.25 % cream  Commonly known as:  TOPICORT  Apply 1 application topically 2 (two) times daily.     Flax Seed Oil 1300 MG Caps  Take 1 capsule by mouth daily.  FLUoxetine 20 MG tablet  Commonly known as:  PROZAC  Take 20 mg by mouth daily.     LEVOTHROID 125 MCG tablet  Generic drug:  levothyroxine  Take 125 mcg by mouth daily before breakfast.     multivitamin with minerals Tabs tablet  Take 1 tablet by mouth daily.     naproxen sodium 220 MG tablet  Commonly known as:  ANAPROX  Take 440 mg by mouth daily.     OMEGA-3 CF PO  Take 1,200 mg by mouth daily.     pravastatin 40 MG tablet  Commonly known as:  PRAVACHOL  Take 40 mg by mouth at bedtime.     vitamin C 1000 MG  tablet  Take 1,000 mg by mouth 2 (two) times daily.        Disposition:     Duration of Discharge Encounter: less than 30 minutes including physician time.  Signed,  Mikle Bosworth.D.

## 2013-06-20 NOTE — Progress Notes (Signed)
Utilization review completed. Kayle Passarelli, RN, BSN. 

## 2013-06-20 NOTE — Discharge Instructions (Signed)
° °  Supplemental Discharge Instructions for  °Pacemaker/Defibrillator Patients ° °Activity °No heavy lifting or vigorous activity with your left/right arm for 6 to 8 weeks.  Do not raise your left/right arm above your head for one week.  Gradually raise your affected arm as drawn below. ° °        ° 06/07                      06/08                       06/09                      06/10 °     ° °NO DRIVING for 1 week; you may begin driving on 06/28/2013. °WOUND CARE °  Keep the wound area clean and dry.  Do not get this area wet for one week. No showers for one week; you may shower on 06/28/2013. °  The tape/steri-strips on your wound will fall off; do not pull them off.  No bandage is needed on the site.  DO  NOT apply any creams, oils, or ointments to the wound area. °  If you notice any drainage or discharge from the wound, any swelling or bruising at the site, or you develop a fever > 101? F after you are discharged home, call the office at once. ° °Special Instructions °  You are still able to use cellular telephones; use the ear opposite the side where you have your pacemaker/defibrillator.  Avoid carrying your cellular phone near your device. °  When traveling through airports, show security personnel your identification card to avoid being screened in the metal detectors.  Ask the security personnel to use the hand wand. °  Avoid arc welding equipment, MRI testing (magnetic resonance imaging), TENS units (transcutaneous nerve stimulators).  Call the office for questions about other devices. °  Avoid electrical appliances that are in poor condition or are not properly grounded. °  Microwave ovens are safe to be near or to operate. °

## 2013-06-21 ENCOUNTER — Telehealth: Payer: Self-pay | Admitting: Internal Medicine

## 2013-06-21 NOTE — Telephone Encounter (Signed)
ATC Cell-fast busy signal  Called the home number and spoke with the pt  He states that [employer] advised he may need a new PSG in order to get his new CPAP mask that we ordered for him back in April  He states that his current mask is falling apart  I called [employer] and spoke with Arbie Cookey  She states will have to look into this and call us on Monday  Pt aware of this

## 2013-07-01 ENCOUNTER — Encounter: Payer: Self-pay | Admitting: Internal Medicine

## 2013-07-01 ENCOUNTER — Ambulatory Visit (INDEPENDENT_AMBULATORY_CARE_PROVIDER_SITE_OTHER): Payer: Medicare Other | Admitting: *Deleted

## 2013-07-01 DIAGNOSIS — I440 Atrioventricular block, first degree: Secondary | ICD-10-CM

## 2013-07-01 DIAGNOSIS — I495 Sick sinus syndrome: Secondary | ICD-10-CM

## 2013-07-01 LAB — MDC_IDC_ENUM_SESS_TYPE_INCLINIC
Battery Remaining Longevity: 12
Brady Statistic RA Percent Paced: 77 %
Lead Channel Impedance Value: 675 Ohm
Lead Channel Impedance Value: 716 Ohm
Lead Channel Pacing Threshold Amplitude: 0.9 V
Lead Channel Pacing Threshold Pulse Width: 0.5 ms
Lead Channel Sensing Intrinsic Amplitude: 15.3 mV
Lead Channel Sensing Intrinsic Amplitude: 5.8 mV
Lead Channel Setting Pacing Amplitude: 1.6 V
Lead Channel Setting Pacing Pulse Width: 0.4 ms
Lead Channel Setting Sensing Sensitivity: 2.5 mV
MDC IDC PG SERIAL: 389736
MDC IDC SESS DTM: 20150615040000
MDC IDC SET LEADCHNL RA PACING AMPLITUDE: 3.5 V
MDC IDC SET ZONE DETECTION INTERVAL: 375 ms
MDC IDC STAT BRADY RV PERCENT PACED: 100 %

## 2013-07-02 NOTE — Progress Notes (Signed)
Wound check appointment. Steri-strips removed. Wound without redness or edema. Incision edges approximated, wound well healed. Normal device function. Thresholds, sensing, and impedances consistent with implant measurements. Device programmed at 3.5V and auto capture programmed on for extra safety margin until 3 month visit. Histogram distribution appropriate for patient and level of activity. No mode switches or high ventricular rates noted. Patient educated about wound care, arm mobility, lifting restrictions. ROV in 3 months with GT.

## 2013-07-03 ENCOUNTER — Telehealth: Payer: Self-pay | Admitting: Internal Medicine

## 2013-07-03 DIAGNOSIS — G4733 Obstructive sleep apnea (adult) (pediatric): Secondary | ICD-10-CM

## 2013-07-03 NOTE — Telephone Encounter (Signed)
Called spoke with Webb Silversmith. Pt CPAP mask is broken and needs a new one. I have sent order. Nothing further needed

## 2013-07-09 ENCOUNTER — Telehealth: Payer: Self-pay | Admitting: Internal Medicine

## 2013-07-09 NOTE — Telephone Encounter (Signed)
New message     Pt had pacemaker put in 2wks ago----can he have dental cleaning and xrays?

## 2013-07-09 NOTE — Telephone Encounter (Signed)
Ok to have teeth cleaned and xrays done

## 2013-09-24 ENCOUNTER — Ambulatory Visit (INDEPENDENT_AMBULATORY_CARE_PROVIDER_SITE_OTHER): Payer: Medicare Other | Admitting: Internal Medicine

## 2013-09-24 ENCOUNTER — Encounter: Payer: Self-pay | Admitting: Internal Medicine

## 2013-09-24 VITALS — BP 146/80 | HR 61 | Ht 75.25 in | Wt 242.4 lb

## 2013-09-24 DIAGNOSIS — I495 Sick sinus syndrome: Secondary | ICD-10-CM

## 2013-09-24 DIAGNOSIS — I10 Essential (primary) hypertension: Secondary | ICD-10-CM

## 2013-09-24 DIAGNOSIS — Z950 Presence of cardiac pacemaker: Secondary | ICD-10-CM

## 2013-09-24 LAB — MDC_IDC_ENUM_SESS_TYPE_INCLINIC
Brady Statistic RA Percent Paced: 71 %
Date Time Interrogation Session: 20150908040000
Implantable Pulse Generator Serial Number: 389736
Lead Channel Impedance Value: 698 Ohm
Lead Channel Impedance Value: 711 Ohm
Lead Channel Pacing Threshold Amplitude: 0.8 V
Lead Channel Pacing Threshold Pulse Width: 0.5 ms
Lead Channel Sensing Intrinsic Amplitude: 14.6 mV
Lead Channel Setting Pacing Pulse Width: 0.4 ms
Lead Channel Setting Sensing Sensitivity: 2.5 mV
MDC IDC MSMT LEADCHNL RA SENSING INTR AMPL: 4.9 mV
MDC IDC SET LEADCHNL RA PACING AMPLITUDE: 2 V
MDC IDC SET LEADCHNL RV PACING AMPLITUDE: 1.4 V
MDC IDC SET ZONE DETECTION INTERVAL: 375 ms
MDC IDC STAT BRADY RV PERCENT PACED: 100 %

## 2013-09-24 NOTE — Patient Instructions (Signed)
Your physician wants you to follow-up in: MAY 2016. You will receive a reminder letter in the mail two months in advance. If you don't receive a letter, please call our office to schedule the follow-up appointment.  Remote monitoring is used to monitor your Pacemaker or ICD from home. This monitoring reduces the number of office visits required to check your device to one time per year. It allows [place] to keep an eye on the functioning of your device to ensure it is working properly. You are scheduled for a device check from home on 12/26/13. You may send your transmission at any time that day. If you have a wireless device, the transmission will be sent automatically. After your physician reviews your transmission, you will receive a postcard with your next transmission date.

## 2013-09-24 NOTE — Assessment & Plan Note (Signed)
His [employer] DDD PM is working normally. Will recheck in several months.

## 2013-09-24 NOTE — Progress Notes (Signed)
HPI [name] returns today for followup. He is a pleasant 80 yo man with symptomatic bradycardia, s/p PPM insertion. In the interim, he has done well. He is mowing 2 acres of grass a week. No other complaints. He denies chest pain or sob.  No Known Allergies   Current Outpatient Prescriptions  Medication Sig Dispense Refill  . albuterol (PROVENTIL HFA;VENTOLIN HFA) 108 (90 BASE) MCG/ACT inhaler 1-2 puffs every 6 hours if needed for shortness of breath or wheezing- rescue inhaler      . alclomethasone (ACLOVATE) 0.05 % ointment Apply 1 application topically 2 (two) times daily as needed (for rash).      . Ascorbic Acid (VITAMIN C) 1000 MG tablet Take 1,000 mg by mouth 2 (two) times daily.       Marland Kitchen aspirin 81 MG tablet Take 81 mg by mouth daily.      . carbamazepine (TEGRETOL) 200 MG tablet Take 200 mg by mouth 5 (five) times daily.       . carbidopa-levodopa (SINEMET IR) 25-100 MG per tablet Take 1 tablet by mouth 3 (three) times daily.      . cholecalciferol (VITAMIN D) 1000 UNITS tablet Take 1,000 Units by mouth daily.        Marland Kitchen desoximetasone (TOPICORT) 0.25 % cream Apply 1 application topically 2 (two) times daily.      . Flaxseed, Linseed, (FLAX SEED OIL) 1300 MG CAPS Take 1 capsule by mouth daily.       Marland Kitchen FLUoxetine (PROZAC) 20 MG tablet Take 20 mg by mouth daily.       Marland Kitchen levothyroxine (LEVOTHROID) 125 MCG tablet Take 125 mcg by mouth daily before breakfast.       . Multiple Vitamin (MULITIVITAMIN WITH MINERALS) TABS Take 1 tablet by mouth daily.      . naproxen sodium (ANAPROX) 220 MG tablet Take 440 mg by mouth daily.      . Omega-3 Fatty Acids (OMEGA-3 CF PO) Take 1,200 mg by mouth daily.       . pravastatin (PRAVACHOL) 40 MG tablet Take 40 mg by mouth at bedtime.        No current facility-administered medications for this visit.     Past Medical History  Diagnosis Date  . UPJ obstruction, congenital   . COPD (chronic obstructive pulmonary disease)   . Bipolar 1  disorder   . Pneumonia 1970  . Depression   . Thyroid disease   . Hypertension     pt denies but was in medical record  . Skin cancer (melanoma)   . First degree AV block   . Unspecified hypothyroidism   . Myalgia and myositis, unspecified   . Obesity   . Hypoglycemia, unspecified     ROS:   All systems reviewed and negative except as noted in the HPI.   Past Surgical History  Procedure Laterality Date  . Appendectomy  1939  . Urethra surgery  2010    reconstruction  . Right big toe  surgery  2009  . Malignant melanoma removed from right forehead  2008  . Transurethral resection of prostate  02/04/2011    Procedure: TRANSURETHRAL RESECTION OF THE PROSTATE (TURP);  Surgeon: Dutch Gray, MD;  Location: [facility];  Service: Urology;  Laterality: N/A;     Family History  Problem Relation Age of Onset  . Cancer Mother      History   Social History  . Marital Status: Married    Spouse Name:  N/A    Number of Children: N/A  . Years of Education: N/A   Occupational History  . Not on file.   Social History Main Topics  . Smoking status: Former Smoker -- 2.00 packs/day for 40 years    Types: Cigarettes  . Smokeless tobacco: Never Used  . Alcohol Use: No  . Drug Use: No  . Sexual Activity: Not on file   Other Topics Concern  . Not on file   Social History Narrative  . No narrative on file     BP 146/80  Pulse 61  Ht 6' 3.25" (1.911 m)  Wt 242 lb 6.4 oz (109.952 kg)  BMI 30.11 kg/m2  Physical Exam:  Well appearing 80 yo man, NAD HEENT: Unremarkable Neck:  No JVD, no thyromegally Back:  No CVA tenderness Lungs:  Clear with no wheezes HEART:  Regular rate rhythm, no murmurs, no rubs, no clicks Abd:  soft, positive bowel sounds, no organomegally, no rebound, no guarding Ext:  2 plus pulses, no edema, no cyanosis, no clubbing Skin:  No rashes no nodules Neuro:  CN II through XII intact, motor grossly intact   DEVICE  Normal device function.  See PaceArt  for details.   Assess/Plan:

## 2013-09-24 NOTE — Assessment & Plan Note (Signed)
His blood pressure is fairly well controlled. No change in meds. 

## 2013-10-25 ENCOUNTER — Ambulatory Visit: Payer: Medicare Other | Admitting: Internal Medicine

## 2013-10-25 ENCOUNTER — Encounter: Payer: Self-pay | Admitting: Internal Medicine

## 2013-10-25 VITALS — BP 98/64 | HR 67 | Ht 75.0 in | Wt 248.4 lb

## 2013-10-25 DIAGNOSIS — G4733 Obstructive sleep apnea (adult) (pediatric): Secondary | ICD-10-CM

## 2013-10-25 NOTE — Progress Notes (Signed)
12/28/10- 78 yoM former smoker, followed for COPD, OSA, complicated by BiPolar, HBP, hx melanoma, DM LOV-12/19/2007 Has had flu vaccine. He had changed his CPAP company to [employer], but complains that his mask is old and worn out he can't wear it. Therefore he has not used CPAP in some months. His wife tells him that without CPAP he snores loudly and talks in his sleep. He recognizes he is not sleeping as well. His COPD has been stable with insignificant cough and wheeze. He denies noticing shortness of breath while walking. He has no inhaled medications. CXR-04/09/2009-COPD, no active process.  06/14/11- 49 yoM former smoker, followed for COPD, OSA, complicated by BiPolar, HBP, hx melanoma, DM Cough from time to time: CAT score of 13; Was given QVAR from PCP and can tell slight difference-? aerochamber to use with it. Says he has had productive cough for about a year. Some increased shortness of breath making it hard to say. Gradually worse since last office visit. Dr. Tollie Pizza gave Qvar which may help some. Continues CPAP for sleep apnea with good compliance and control. Not Parkinson, but may be related to his bipolar medicines. CXR 02/02/11-  IMPRESSION:  Generalized hyperinflation configuration consistent with COPD. No  acute superimposed abnormality.  Original Report Authenticated By: Delane Ginger, M.D.    07/15/11-  31 yoM former smoker, followed for COPD, OSA, complicated by BiPolar, HBP, hx melanoma, DM  Patient states about the same as last visit. c/o sob with exertion and  a little chest tightness. Denies chest pain, wheezing, and cough.  COPD Assessment Test (CAT) score today 9/40 6 MWT- 99%, 100%, 99%, 510 meters PFT 07/07/2011-mild obstructive airways disease, small airways, insignificant response to bronchodilator. Normal lung volumes, normal diffusion capacity. FEV1/FVC 0.61 For sleep apnea CPAP 9 is comfortable usually. In early June he had fallen and hit his head with fractured nose  so he had to stop using CPAP for a while but is getting started again. He likes [facility] which is associated with decreased cough. Dyspnea on exertion noted pulling empty trash can 75 feet up of mild slope. AeroChamber spacer helps with inhaler.  07/16/12- 78 yoM former smoker, followed for COPD, OSA, complicated by BiPolar, HBP, hx melanoma, DM FOLLOWS FOR: reports CPAP is working, wearing machine approx 6-8 hr per night FOLLOWS FOR: patient reports breathing is doing well-- states since starting [facility] voice has gotten hoarse-- denies any other concerns at this time            Wife here Happy with CPAP 9/ [employer]. Good control. No longer coughing. Dulera 100 2 puffs- hoarse despite aerochamber.  Notes DOE pulling trash can, but not new. Aware heart can contribute to DOE. Can't hold breath to sing in church.   02/22/13-  78 yoM former smoker, followed for COPD, rhinitis, OSA, complicated by BiPolar, HBP, hx melanoma, DM FOLLOWS FOR:Pt states his breathing is fair-depends on what he is doing.Pt was told to speak with CY about his OSA and heart issues   CPAP 9/ [facility] Compliant with CPAP but we aren't sure about pressure control. Insurance may want him to change maintenance inhaler. Medications were reviewed. Nasal congestion expect  worse with spring pollen  04/25/13- 80 yoM former smoker, followed for COPD, rhinitis, OSA, complicated by BiPolar, HBP, hx melanoma, DM FOLLOWS FOR:sob same,denies cough or wheeze,wearing hrt. monitor for 2 wks. now,can't afford Advair,no cp or tightness,doing good with CPAP 9/ [facility] wears avg. 8 hrs.,needs new nasal pillows Wearing recorder for cardiology because  heart rate limits exertion. Does not miss Advair and denies cough or wheeze. Rare need for rescue inhaler CXR 02/22/13 IMPRESSION:  No acute cardiopulmonary disease. COPD. No change from the prior  study.  Electronically Signed  By: Lajean Manes M.D.  On: 02/22/2013 16:24  10/25/13- 77 yoM former smoker,  followed for COPD, rhinitis, OSA, complicated by BiPolar, HBP, hx melanoma, DM, pacemaker FOLLOWS FOR: Wears CPAP 9 every night for about 8 hours; DME is [facility].  CXR  06/20/13 IMPRESSION:  No active cardiopulmonary disease. Mild emphysematous disease.  Left-sided pacemaker intact. No evidence of pneumothorax.  Electronically Signed  By: Marin Olp M.D.  On: 06/20/2013 08:31      ROS-see HPI Constitutional:   No-   weight loss, night sweats, fevers, chills, fatigue, lassitude. HEENT:   No-  headaches, difficulty swallowing, tooth/dental problems, sore throat,       No-  sneezing, itching, ear ache, nasal congestion, post nasal drip,  CV:  No-   chest pain, orthopnea, PND, swelling in lower extremities, anasarca, dizziness, palpitations Resp: +  shortness of breath with exertion or at rest.              No-productive cough,  No non-productive cough,  No- coughing up of blood.              No-   change in color of mucus.  No- wheezing.   Skin: No-   rash or lesions. GI:  No-   heartburn, indigestion, abdominal pain, nausea, vomiting,  GU:  MS:  + joint pain or swelling. Weakness and falls. Neuro-     tremor Psych:  No- change in mood or affect. No depression or anxiety.  No memory loss.  OBJ General- Alert, Oriented, Affect-appropriate, Distress- none acute, looks well Skin- rash-none, lesions- none, excoriation- none Lymphadenopathy- none Head- atraumatic            Eyes- Gross vision intact, PERRLA, conjunctivae clear secretions, + R black eye "fell"            Ears- Hearing, canals-normal            Nose- Clear, no-Septal dev, mucus, polyps, erosion, perforation             Throat- Mallampati II , mucosa clear , drainage- none, tonsils- atrophic Neck- flexible , trachea midline, no stridor , thyroid nl, carotid no bruit Chest - symmetrical excursion , unlabored           Heart/CV-  Pulse is RRR , no murmur , no gallop  , no rub, nl s1 s2                           - JVD- none  , edema- none, stasis changes- none, varices- none           Lung- +few crackles right base, unlabored, wheeze- none, cough- none , dullness-none,                      rub- none           Chest wall-  Abd- HSM- not indicated Br/ Gen/ Rectal- Not done, not indicated Extrem- cyanosis- none, clubbing, none, atrophy- none, strength- nl, using a cane.  Neuro- +resting tremor of hands

## 2013-10-25 NOTE — Patient Instructions (Signed)
We can continue CPAP 9/ [facility]  Please call if we can help

## 2013-12-26 ENCOUNTER — Encounter (HOSPITAL_COMMUNITY): Payer: Self-pay | Admitting: Internal Medicine

## 2013-12-26 ENCOUNTER — Ambulatory Visit (INDEPENDENT_AMBULATORY_CARE_PROVIDER_SITE_OTHER): Payer: Medicare Other | Admitting: *Deleted

## 2013-12-26 DIAGNOSIS — I495 Sick sinus syndrome: Secondary | ICD-10-CM

## 2013-12-26 NOTE — Progress Notes (Signed)
Remote pacemaker transmission.   

## 2014-01-07 LAB — MDC_IDC_ENUM_SESS_TYPE_REMOTE
Battery Remaining Longevity: 144 mo
Battery Remaining Percentage: 100 %
Date Time Interrogation Session: 20151210141100
Implantable Pulse Generator Serial Number: 389736
Lead Channel Impedance Value: 677 Ohm
Lead Channel Impedance Value: 730 Ohm
Lead Channel Setting Pacing Pulse Width: 0.4 ms
Lead Channel Setting Sensing Sensitivity: 2.5 mV
MDC IDC SET LEADCHNL RA PACING AMPLITUDE: 2 V
MDC IDC SET LEADCHNL RV PACING AMPLITUDE: 1.6 V
MDC IDC STAT BRADY RA PERCENT PACED: 73 %
MDC IDC STAT BRADY RV PERCENT PACED: 100 %
Zone Setting Detection Interval: 375 ms

## 2014-01-20 ENCOUNTER — Encounter: Payer: Self-pay | Admitting: Internal Medicine

## 2014-01-20 ENCOUNTER — Ambulatory Visit: Payer: Medicare Other | Admitting: Internal Medicine

## 2014-01-20 VITALS — BP 134/72 | HR 60 | Ht 76.0 in | Wt 244.8 lb

## 2014-01-20 DIAGNOSIS — J449 Chronic obstructive pulmonary disease, unspecified: Secondary | ICD-10-CM

## 2014-01-20 MED ORDER — MOMETASONE FURO-FORMOTEROL FUM 100-5 MCG/ACT IN AERO: INHALATION_SPRAY | RESPIRATORY_TRACT | Status: DC

## 2014-01-20 NOTE — Progress Notes (Signed)
12/28/10- 78 yoM former smoker, followed for COPD, OSA, complicated by BiPolar, HBP, hx melanoma, DM LOV-12/19/2007 Has had flu vaccine. He had changed his CPAP company to [employer], but complains that his mask is old and worn out he can't wear it. Therefore he has not used CPAP in some months. His wife tells him that without CPAP he snores loudly and talks in his sleep. He recognizes he is not sleeping as well. His COPD has been stable with insignificant cough and wheeze. He denies noticing shortness of breath while walking. He has no inhaled medications. CXR-04/09/2009-COPD, no active process.  06/14/11- 71 yoM former smoker, followed for COPD, OSA, complicated by BiPolar, HBP, hx melanoma, DM Cough from time to time: CAT score of 13; Was given QVAR from PCP and can tell slight difference-? aerochamber to use with it. Says he has had productive cough for about a year. Some increased shortness of breath making it hard to say. Gradually worse since last office visit. Dr. Tollie Pizza gave Qvar which may help some. Continues CPAP for sleep apnea with good compliance and control. Not Parkinson, but may be related to his bipolar medicines. CXR 02/02/11-  IMPRESSION:  Generalized hyperinflation configuration consistent with COPD. No  acute superimposed abnormality.  Original Report Authenticated By: Delane Ginger, M.D.    07/15/11-  40 yoM former smoker, followed for COPD, OSA, complicated by BiPolar, HBP, hx melanoma, DM  Patient states about the same as last visit. c/o sob with exertion and  a little chest tightness. Denies chest pain, wheezing, and cough.  COPD Assessment Test (CAT) score today 9/40 6 MWT- 99%, 100%, 99%, 510 meters PFT 07/07/2011-mild obstructive airways disease, small airways, insignificant response to bronchodilator. Normal lung volumes, normal diffusion capacity. FEV1/FVC 0.61 For sleep apnea CPAP 9 is comfortable usually. In early June he had fallen and hit his head with fractured nose  so he had to stop using CPAP for a while but is getting started again. He likes [facility] which is associated with decreased cough. Dyspnea on exertion noted pulling empty trash can 75 feet up of mild slope. AeroChamber spacer helps with inhaler.  07/16/12- 78 yoM former smoker, followed for COPD, OSA, complicated by BiPolar, HBP, hx melanoma, DM FOLLOWS FOR: reports CPAP is working, wearing machine approx 6-8 hr per night FOLLOWS FOR: patient reports breathing is doing well-- states since starting [facility] voice has gotten hoarse-- denies any other concerns at this time            Wife here Happy with CPAP 9/ [employer]. Good control. No longer coughing. Dulera 100 2 puffs- hoarse despite aerochamber.  Notes DOE pulling trash can, but not new. Aware heart can contribute to DOE. Can't hold breath to sing in church.   02/22/13-  78 yoM former smoker, followed for COPD, rhinitis, OSA, complicated by BiPolar, HBP, hx melanoma, DM FOLLOWS FOR:Pt states his breathing is fair-depends on what he is doing.Pt was told to speak with CY about his OSA and heart issues   CPAP 9/ [employer] Compliant with CPAP but we aren't sure about pressure control. Insurance may want him to change maintenance inhaler. Medications were reviewed. Nasal congestion expect  worse with spring pollen  04/25/13- 80 yoM former smoker, followed for COPD, rhinitis, OSA, complicated by BiPolar, HBP, hx melanoma, DM FOLLOWS FOR:sob same,denies cough or wheeze,wearing hrt. monitor for 2 wks. now,can't afford Advair,no cp or tightness,doing good with CPAP 9/ [employer] wears avg. 8 hrs.,needs new nasal pillows Wearing recorder for cardiology because  heart rate limits exertion. Does not miss Advair and denies cough or wheeze. Rare need for rescue inhaler CXR 02/22/13 IMPRESSION:  No acute cardiopulmonary disease. COPD. No change from the prior  study.  Electronically Signed  By: Lajean Manes M.D.  On: 02/22/2013 16:24  10/25/13- 15 yoM former smoker,  followed for COPD, rhinitis, OSA, complicated by BiPolar, HBP, hx melanoma, DM, pacemaker FOLLOWS FOR: Wears CPAP 9 every night for about 8 hours; DME is [employer].  CXR  06/20/13 IMPRESSION:  No active cardiopulmonary disease. Mild emphysematous disease.  Left-sided pacemaker intact. No evidence of pneumothorax.  Electronically Signed  By: Marin Olp M.D.  On: 06/20/2013 08:31  01/20/14- 80 yoM former smoker, followed for COPD, rhinitis, OSA, complicated by BiPolar, HBP, hx melanoma, DM, pacemaker CPAP 9/ [employer]- ok Pt c/o wheezing, "hacking" cough with mucus production (pt swallows mucus, unsure of color), chest congestion and SOB. Denies treating cough with OTC meds.  Pt would like to discuss restarting Dulera   ROS-see HPI Constitutional:   No-   weight loss, night sweats, fevers, chills, fatigue, lassitude. HEENT:   No-  headaches, difficulty swallowing, tooth/dental problems, sore throat,       No-  sneezing, itching, ear ache, nasal congestion, post nasal drip,  CV:  No-   chest pain, orthopnea, PND, swelling in lower extremities, anasarca, dizziness, palpitations Resp: +  shortness of breath with exertion or at rest.              No-productive cough,  No non-productive cough,  No- coughing up of blood.              No-   change in color of mucus.  No- wheezing.   Skin: No-   rash or lesions. GI:  No-   heartburn, indigestion, abdominal pain, nausea, vomiting,  GU:  MS:  + joint pain or swelling. Weakness and falls. Neuro-     tremor Psych:  No- change in mood or affect. No depression or anxiety.  No memory loss.  OBJ General- Alert, Oriented, Affect-appropriate, Distress- none acute, looks well Skin- rash-none, lesions- none, excoriation- none Lymphadenopathy- none Head- atraumatic            Eyes- Gross vision intact, PERRLA, conjunctivae clear secretions, + R black eye "fell"            Ears- Hearing, canals-normal            Nose- Clear, no-Septal dev, mucus, polyps,  erosion, perforation             Throat- Mallampati II , mucosa clear , drainage- none, tonsils- atrophic Neck- flexible , trachea midline, no stridor , thyroid nl, carotid no bruit Chest - symmetrical excursion , unlabored           Heart/CV-  Pulse is RRR , no murmur , no gallop  , no rub, nl s1 s2                           - JVD- none , edema- none, stasis changes- none, varices- none           Lung- +few crackles right base, unlabored, wheeze- none, cough- none , dullness-none,                      rub- none           Chest wall-  Abd- HSM- not indicated Br/ Gen/ Rectal- Not done,  not indicated Extrem- cyanosis- none, clubbing, none, atrophy- none, strength- nl, using a cane.  Neuro- +resting tremor of hands

## 2014-01-20 NOTE — Patient Instructions (Addendum)
Sample and script Dulera 100   2 puffs then rinse mouth, twice daily maintenance  Please call as needed

## 2014-01-21 ENCOUNTER — Encounter: Payer: Self-pay | Admitting: Cardiology

## 2014-01-24 ENCOUNTER — Encounter: Payer: Self-pay | Admitting: Internal Medicine

## 2014-02-04 ENCOUNTER — Encounter: Payer: Self-pay | Admitting: Cardiology

## 2014-03-25 ENCOUNTER — Emergency Department (HOSPITAL_BASED_OUTPATIENT_CLINIC_OR_DEPARTMENT_OTHER)
Admission: EM | Admit: 2014-03-25 | Discharge: 2014-03-25 | Disposition: A | Discharge status: Home / Self Care | Payer: Medicare Other | Attending: Emergency Medicine | Admitting: Emergency Medicine

## 2014-03-25 ENCOUNTER — Emergency Department (HOSPITAL_BASED_OUTPATIENT_CLINIC_OR_DEPARTMENT_OTHER): Payer: Medicare Other

## 2014-03-25 ENCOUNTER — Encounter (HOSPITAL_BASED_OUTPATIENT_CLINIC_OR_DEPARTMENT_OTHER): Payer: Self-pay | Admitting: *Deleted

## 2014-03-25 DIAGNOSIS — H6121 Impacted cerumen, right ear: Secondary | ICD-10-CM | POA: Insufficient documentation

## 2014-03-25 DIAGNOSIS — I10 Essential (primary) hypertension: Secondary | ICD-10-CM | POA: Insufficient documentation

## 2014-03-25 DIAGNOSIS — Z791 Long term (current) use of non-steroidal anti-inflammatories (NSAID): Secondary | ICD-10-CM | POA: Diagnosis not present

## 2014-03-25 DIAGNOSIS — Z7952 Long term (current) use of systemic steroids: Secondary | ICD-10-CM | POA: Insufficient documentation

## 2014-03-25 DIAGNOSIS — E669 Obesity, unspecified: Secondary | ICD-10-CM | POA: Diagnosis not present

## 2014-03-25 DIAGNOSIS — Z87891 Personal history of nicotine dependence: Secondary | ICD-10-CM | POA: Insufficient documentation

## 2014-03-25 DIAGNOSIS — Z85820 Personal history of malignant melanoma of skin: Secondary | ICD-10-CM | POA: Insufficient documentation

## 2014-03-25 DIAGNOSIS — M5412 Radiculopathy, cervical region: Secondary | ICD-10-CM | POA: Insufficient documentation

## 2014-03-25 DIAGNOSIS — Z87718 Personal history of other specified (corrected) congenital malformations of genitourinary system: Secondary | ICD-10-CM | POA: Insufficient documentation

## 2014-03-25 DIAGNOSIS — E039 Hypothyroidism, unspecified: Secondary | ICD-10-CM | POA: Insufficient documentation

## 2014-03-25 DIAGNOSIS — Z79899 Other long term (current) drug therapy: Secondary | ICD-10-CM | POA: Insufficient documentation

## 2014-03-25 DIAGNOSIS — Z8701 Personal history of pneumonia (recurrent): Secondary | ICD-10-CM | POA: Diagnosis not present

## 2014-03-25 DIAGNOSIS — Z7982 Long term (current) use of aspirin: Secondary | ICD-10-CM | POA: Insufficient documentation

## 2014-03-25 DIAGNOSIS — Z7951 Long term (current) use of inhaled steroids: Secondary | ICD-10-CM | POA: Diagnosis not present

## 2014-03-25 DIAGNOSIS — J449 Chronic obstructive pulmonary disease, unspecified: Secondary | ICD-10-CM | POA: Diagnosis not present

## 2014-03-25 DIAGNOSIS — R51 Headache: Secondary | ICD-10-CM | POA: Insufficient documentation

## 2014-03-25 DIAGNOSIS — F319 Bipolar disorder, unspecified: Secondary | ICD-10-CM | POA: Diagnosis not present

## 2014-03-25 DIAGNOSIS — M542 Cervicalgia: Secondary | ICD-10-CM | POA: Diagnosis present

## 2014-03-25 NOTE — ED Provider Notes (Signed)
CSN: [id number]     Arrival date & time 03/25/14  9518 History   First MD Initiated Contact with Patient 03/25/14 [phone]     No chief complaint on file.    (Consider location/radiation/quality/duration/timing/severity/associated sxs/prior Treatment) The history is provided by the patient.  Victor Osborne is a 81 y.o. male hx of COPD, bipolar, HTN here with  Right-sided headaches.  He has been having shooting pain from the right neck into the right temporal area intermittently for the last 2 days.  Felt numb when the pain comes on but currently not having any pain.  Denies any blurry vision or fevers or vomiting.  Has intermittent jaw pain associated with it as well.  Denies any chest pain or shortness of breath.   Past Medical History  Diagnosis Date  . UPJ obstruction, congenital   . COPD (chronic obstructive pulmonary disease)   . Bipolar 1 disorder   . Pneumonia 1970  . Depression   . Thyroid disease   . Hypertension     pt denies but was in medical record  . Skin cancer (melanoma)   . First degree AV block   . Unspecified hypothyroidism   . Myalgia and myositis, unspecified   . Obesity   . Hypoglycemia, unspecified    Past Surgical History  Procedure Laterality Date  . Appendectomy  1939  . Urethra surgery  2010    reconstruction  . Right big toe  surgery  2009  . Malignant melanoma removed from right forehead  2008  . Transurethral resection of prostate  02/04/2011    Procedure: TRANSURETHRAL RESECTION OF THE PROSTATE (TURP);  Surgeon: Dutch Gray, MD;  Location: [facility]S;  Service: Urology;  Laterality: N/A;  . Permanent pacemaker insertion N/A 06/19/2013    Procedure: PERMANENT PACEMAKER INSERTION;  Surgeon: Evans Lance, MD;  Location: [facility] CATH LAB;  Service: Cardiovascular;  Laterality: N/A;   Family History  Problem Relation Age of Onset  . Cancer Mother    History  Substance Use Topics  . Smoking status: Former Smoker -- 2.00 packs/day for 40 years    Types:  Cigarettes  . Smokeless tobacco: Never Used  . Alcohol Use: No    Review of Systems  Neurological: Positive for headaches.  All other systems reviewed and are negative.     Allergies  Review of patient's allergies indicates no known allergies.  Home Medications   Prior to Admission medications   Medication Sig Start Date End Date Taking? Authorizing Provider  albuterol (PROVENTIL HFA;VENTOLIN HFA) 108 (90 BASE) MCG/ACT inhaler 1-2 puffs every 6 hours if needed for shortness of breath or wheezing- rescue inhaler 07/16/12  Yes Deneise Lever, MD  alclomethasone (ACLOVATE) 0.05 % ointment Apply 1 application topically 2 (two) times daily as needed (for rash).   Yes Historical Provider, MD  aspirin 81 MG tablet Take 81 mg by mouth daily.   Yes Historical Provider, MD  carbamazepine (TEGRETOL) 200 MG tablet Take 200 mg by mouth 5 (five) times daily.  10/20/10  Yes Historical Provider, MD  carbidopa-levodopa (SINEMET IR) 25-100 MG per tablet Take 1 tablet by mouth 3 (three) times daily.   Yes Historical Provider, MD  cholecalciferol (VITAMIN D) 1000 UNITS tablet Take 1,000 Units by mouth daily.     Yes Historical Provider, MD  desoximetasone (TOPICORT) 0.25 % cream Apply 1 application topically 2 (two) times daily.   Yes Historical Provider, MD  Flaxseed, Linseed, (FLAX SEED OIL) 1300 MG CAPS Take 1  capsule by mouth daily.    Yes Historical Provider, MD  FLUoxetine (PROZAC) 20 MG tablet Take 20 mg by mouth daily.    Yes Historical Provider, MD  levothyroxine (LEVOTHROID) 125 MCG tablet Take 125 mcg by mouth daily before breakfast.    Yes Historical Provider, MD  mometasone-formoterol (DULERA) 100-5 MCG/ACT AERO 2 puffs then rinse mouth, twice daily maintenance 01/20/14  Yes Deneise Lever, MD  Multiple Vitamin (MULITIVITAMIN WITH MINERALS) TABS Take 1 tablet by mouth daily.   Yes Historical Provider, MD  Omega-3 Fatty Acids (OMEGA-3 CF PO) Take 1,200 mg by mouth daily.    Yes Historical  Provider, MD  pravastatin (PRAVACHOL) 40 MG tablet Take 40 mg by mouth at bedtime.    Yes Historical Provider, MD  SEROQUEL XR 50 MG TB24 24 hr tablet Take 50 mg by mouth at bedtime. 12/27/13  Yes Historical Provider, MD  Ascorbic Acid (VITAMIN C) 1000 MG tablet Take 1,000 mg by mouth 2 (two) times daily.     Historical Provider, MD  naproxen sodium (ANAPROX) 220 MG tablet Take 440 mg by mouth daily.    Historical Provider, MD   BP 162/76 mmHg  Pulse 70  Temp(Src) 97.8 F (36.6 C) (Oral)  Resp 16  Ht 6\' 4"  (1.93 m)  Wt 240 lb (108.863 kg)  BMI 29.23 kg/m2  SpO2 100% Physical Exam  Constitutional: He is oriented to person, place, and time.  Chronically ill, NAD   HENT:  Head: Normocephalic.  Mouth/Throat: Oropharynx is clear and moist.  No tenderness R temporal area. R cerumen impaction   Eyes: Conjunctivae are normal. Pupils are equal, round, and reactive to light.  Neck: Normal range of motion. Neck supple.  Mild R paracervical tenderness   Cardiovascular: Normal rate and normal heart sounds.   Pulmonary/Chest: Effort normal and breath sounds normal. No respiratory distress. He has no wheezes. He has no rales.  Abdominal: Soft. Bowel sounds are normal. He exhibits no distension. There is no tenderness. There is no rebound.  Musculoskeletal: Normal range of motion. He exhibits no edema or tenderness.  Neurological: He is alert and oriented to person, place, and time. Coordination normal.  Resting tremors (chronic). Nl strength and sensation throughout. CN 2-12 intact   Skin: Skin is warm and dry.  Psychiatric: He has a normal mood and affect. His behavior is normal. Judgment and thought content normal.  Nursing note and vitals reviewed.   ED Course  Procedures (including critical care time) Labs Review Labs Reviewed - No data to display  Imaging Review Ct Head Wo Contrast  03/25/2014   CLINICAL DATA:  81 year old male with sharp pain in the back of the head radiating to the  right temporal area x2 days. Headache. Initial encounter.  EXAM: CT HEAD WITHOUT CONTRAST  CT MAXILLOFACIAL WITHOUT CONTRAST  CT CERVICAL SPINE WITHOUT CONTRAST  TECHNIQUE: Multidetector CT imaging of the head, cervical spine, and maxillofacial structures were performed using the standard protocol without intravenous contrast. Multiplanar CT image reconstructions of the cervical spine and maxillofacial structures were also generated.  COMPARISON:  06/18/2011.  FINDINGS: CT HEAD FINDINGS  Tympanic cavities and mastoids remain clear. No acute scalp soft tissue finding. Calvarium intact.  Cerebral volume remains normal for age. No ventriculomegaly. No midline shift, mass effect, or evidence of intracranial mass lesion. No acute intracranial hemorrhage identified. No suspicious intracranial vascular hyperdensity. No evidence of cortically based acute infarction identified.  CT MAXILLOFACIAL FINDINGS  Visualized orbit soft tissues are within normal  limits. Negative visualized non contrast deep soft tissue spaces of the face. Stable and negative paranasal sinuses. No acute osseous abnormality identified. Mildly elongated bilateral styloid processes.  CT CERVICAL SPINE FINDINGS  Chronic straightening of cervical lordosis. Chronic widespread cervical disc, endplate, and facet degeneration. Areas of ossification of the posterior longitudinal ligament suspected. Chronic multifactorial spinal stenosis from C2-C3 to C6-C7, mild to moderate. Vacuum facet phenomena occasionally noted. Multilevel cervical foraminal stenosis. No acute cervical fracture. Visible upper thoracic levels appear grossly intact.  Left chest cardiac pacemaker leads partially visible. Emphysema in the lung apices. Calcified carotid atherosclerosis.  IMPRESSION: 1. Stable and negative for age Normal noncontrast CT appearance of the brain. 2. 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 3. No acute fracture or listhesis identified in the cervical spine. Ligamentous injury is not excluded. 4. No acute findings identified about the face.   Electronically Signed   By: Genevie Ann M.D.   On: 03/25/2014 11:07   Ct Cervical Spine Wo Contrast  03/25/2014   CLINICAL DATA:  81 year old male with sharp pain in the back of the head radiating to the right temporal area x2 days. Headache. Initial encounter.  EXAM: CT HEAD WITHOUT CONTRAST  CT MAXILLOFACIAL WITHOUT CONTRAST  CT CERVICAL SPINE WITHOUT CONTRAST  TECHNIQUE: Multidetector CT imaging of the head, cervical spine, and maxillofacial structures were performed using the standard protocol without intravenous contrast. Multiplanar CT image reconstructions of the cervical spine and maxillofacial structures were also generated.  COMPARISON:  06/18/2011.  FINDINGS: CT HEAD FINDINGS  Tympanic cavities and mastoids remain clear. No acute scalp soft tissue finding. Calvarium intact.  Cerebral volume remains normal for age. No ventriculomegaly. No midline shift, mass effect, or evidence of intracranial mass lesion. No acute intracranial hemorrhage identified. No suspicious intracranial vascular hyperdensity. No evidence of cortically based acute infarction identified.  CT MAXILLOFACIAL FINDINGS  Visualized orbit soft tissues are within normal limits. Negative visualized non contrast deep soft tissue spaces of the face. Stable and negative paranasal sinuses. No acute osseous abnormality identified. Mildly elongated bilateral styloid processes.  CT CERVICAL SPINE FINDINGS  Chronic straightening of cervical lordosis. Chronic widespread cervical disc, endplate, and facet degeneration. Areas of ossification of the posterior longitudinal ligament suspected. Chronic multifactorial spinal stenosis from C2-C3 to C6-C7, mild to moderate. Vacuum facet phenomena occasionally noted. Multilevel cervical foraminal stenosis. No acute  cervical fracture. Visible upper thoracic levels appear grossly intact.  Left chest cardiac pacemaker leads partially visible. Emphysema in the lung apices. Calcified carotid atherosclerosis.  IMPRESSION: 1. Stable and negative for age Normal noncontrast CT appearance of the brain. 2. 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 3. No acute fracture or listhesis identified in the cervical spine. Ligamentous injury is not excluded. 4. No acute findings identified about the face.   Electronically Signed   By: Genevie Ann M.D.   On: 03/25/2014 11:07   Ct Maxillofacial Wo Cm  03/25/2014   CLINICAL DATA:  81 year old male with sharp pain in the back of the head radiating to the right temporal area x2 days. Headache. Initial encounter.  EXAM: CT HEAD WITHOUT CONTRAST  CT MAXILLOFACIAL WITHOUT CONTRAST  CT CERVICAL SPINE WITHOUT CONTRAST  TECHNIQUE: Multidetector CT imaging of the head, cervical spine, and maxillofacial structures were performed using the standard protocol without intravenous contrast. Multiplanar CT image reconstructions of the cervical  spine and maxillofacial structures were also generated.  COMPARISON:  06/18/2011.  FINDINGS: CT HEAD FINDINGS  Tympanic cavities and mastoids remain clear. No acute scalp soft tissue finding. Calvarium intact.  Cerebral volume remains normal for age. No ventriculomegaly. No midline shift, mass effect, or evidence of intracranial mass lesion. No acute intracranial hemorrhage identified. No suspicious intracranial vascular hyperdensity. No evidence of cortically based acute infarction identified.  CT MAXILLOFACIAL FINDINGS  Visualized orbit soft tissues are within normal limits. Negative visualized non contrast deep soft tissue spaces of the face. Stable and negative paranasal sinuses. No acute osseous abnormality identified. Mildly elongated bilateral styloid  processes.  CT CERVICAL SPINE FINDINGS  Chronic straightening of cervical lordosis. Chronic widespread cervical disc, endplate, and facet degeneration. Areas of ossification of the posterior longitudinal ligament suspected. Chronic multifactorial spinal stenosis from C2-C3 to C6-C7, mild to moderate. Vacuum facet phenomena occasionally noted. Multilevel cervical foraminal stenosis. No acute cervical fracture. Visible upper thoracic levels appear grossly intact.  Left chest cardiac pacemaker leads partially visible. Emphysema in the lung apices. Calcified carotid atherosclerosis.  IMPRESSION: 1. Stable and negative for age Normal noncontrast CT appearance of the brain. 2. 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 3. No acute fracture or listhesis identified in the cervical spine. Ligamentous injury is not excluded. 4. No acute findings identified about the face.   Electronically Signed   By: Genevie Ann M.D.   On: 03/25/2014 11:07     EKG Interpretation   Date/Time:  Tuesday March 25 2014 10:12:10 EST Ventricular Rate:  65 PR Interval:  230 QRS Duration: 164 QT Interval:  446 QTC Calculation: 463 R Axis:   -69 Text Interpretation:  Atrial-sensed ventricular-paced rhythm with  prolonged AV conduction Abnormal ECG No significant change since last  tracing Confirmed by YAO  MD, DAVID ([id number]) on 03/25/2014 10:24:46 AM      MDM   Final diagnoses:  None    Victor Osborne is a 81 y.o. male here with R sided headache, R neck pain, jaw pain. Currently asymptomatic. I doubt temporal arteritis. No headache currently. Likely cervical radiculopathy. EKG showed no obvious inferior STEMI and patient denies chest pain.   11:14 AM CT head unremarkable. CT neck showed multi level disease. Likely cervical radiculopathy. Has tramadol at home. Neuro exam unremarkable. Will dc home.   Wandra Arthurs, MD 03/25/14 [phone]

## 2014-03-25 NOTE — Discharge Instructions (Signed)
Continue taking tramadol as needed for pain.   Follow up with your doctor.   Return to ER if you have worsening headaches, vomiting, weakness.

## 2014-03-25 NOTE — ED Notes (Signed)
C/o Sharp pain in back of head that went around right side of head to temporal area. Onset 2 days ago. Pt has no sx at present. Pt states when he leans his head back has numb pain in back of head.

## 2014-03-27 ENCOUNTER — Ambulatory Visit (INDEPENDENT_AMBULATORY_CARE_PROVIDER_SITE_OTHER): Payer: Medicare Other | Admitting: *Deleted

## 2014-03-27 DIAGNOSIS — I495 Sick sinus syndrome: Secondary | ICD-10-CM

## 2014-03-27 NOTE — Progress Notes (Signed)
Remote pacemaker transmission.   

## 2014-03-28 LAB — MDC_IDC_ENUM_SESS_TYPE_REMOTE
Battery Remaining Longevity: 144 mo
Battery Remaining Percentage: 100 %
Brady Statistic RA Percent Paced: 71 %
Date Time Interrogation Session: 20160310052100
Lead Channel Impedance Value: 686 Ohm
Lead Channel Impedance Value: 723 Ohm
Lead Channel Pacing Threshold Amplitude: 0.8 V
Lead Channel Setting Pacing Amplitude: 1.7 V
Lead Channel Setting Pacing Pulse Width: 0.4 ms
MDC IDC MSMT LEADCHNL RA PACING THRESHOLD PULSEWIDTH: 0.5 ms
MDC IDC MSMT LEADCHNL RV PACING THRESHOLD AMPLITUDE: 1.2 V
MDC IDC MSMT LEADCHNL RV PACING THRESHOLD PULSEWIDTH: 0.4 ms
MDC IDC PG SERIAL: 389736
MDC IDC SET LEADCHNL RA PACING AMPLITUDE: 2 V
MDC IDC SET LEADCHNL RV SENSING SENSITIVITY: 2.5 mV
MDC IDC STAT BRADY RV PERCENT PACED: 100 %
Zone Setting Detection Interval: 375 ms

## 2014-04-03 ENCOUNTER — Encounter: Payer: Self-pay | Admitting: Cardiology

## 2014-04-09 ENCOUNTER — Encounter: Payer: Self-pay | Admitting: Internal Medicine

## 2014-04-21 ENCOUNTER — Ambulatory Visit (INDEPENDENT_AMBULATORY_CARE_PROVIDER_SITE_OTHER): Payer: Medicare Other | Admitting: Internal Medicine

## 2014-04-21 ENCOUNTER — Encounter: Payer: Self-pay | Admitting: Internal Medicine

## 2014-04-21 ENCOUNTER — Telehealth: Payer: Self-pay | Admitting: Internal Medicine

## 2014-04-21 VITALS — BP 130/80 | HR 63 | Ht 76.0 in | Wt 227.0 lb

## 2014-04-21 DIAGNOSIS — J432 Centrilobular emphysema: Secondary | ICD-10-CM

## 2014-04-21 DIAGNOSIS — G4733 Obstructive sleep apnea (adult) (pediatric): Secondary | ICD-10-CM | POA: Diagnosis not present

## 2014-04-21 NOTE — Telephone Encounter (Signed)
Follow Up      Pt's wife returning [place] phone call.

## 2014-04-21 NOTE — Patient Instructions (Signed)
We can continue CPAP 9/ [place] to stop [place] since we can't tell that it is helping. If shortness of breath or cough seem to be getting worse, we can try to help.

## 2014-04-21 NOTE — Telephone Encounter (Signed)
°  1. Has your device fired? No  2. Is you device beeping? No  3. Are you experiencing draining or swelling at device site? No  4. Are you calling to see if we received your device transmission? Yes  5. Have you passed out? No  Pt also calling stating that there are numbers on his box and is trying to figure out what they mean. Please call back and advise.

## 2014-04-21 NOTE — Telephone Encounter (Signed)
Informed pt that transmission was received.  

## 2014-04-21 NOTE — Telephone Encounter (Signed)
LMOVM for pt wife to return call.  

## 2014-04-21 NOTE — Progress Notes (Signed)
12/28/10- 78 yoM former smoker, followed for COPD, OSA, complicated by BiPolar, HBP, hx melanoma, DM LOV-12/19/2007 Has had flu vaccine. He had changed his CPAP company to [place], but complains that his mask is old and worn out he can't wear it. Therefore he has not used CPAP in some months. His wife tells him that without CPAP he snores loudly and talks in his sleep. He recognizes he is not sleeping as well. His COPD has been stable with insignificant cough and wheeze. He denies noticing shortness of breath while walking. He has no inhaled medications. CXR-04/09/2009-COPD, no active process.  06/14/11- 49 yoM former smoker, followed for COPD, OSA, complicated by BiPolar, HBP, hx melanoma, DM Cough from time to time: CAT score of 13; Was given QVAR from PCP and can tell slight difference-? aerochamber to use with it. Says he has had productive cough for about a year. Some increased shortness of breath making it hard to say. Gradually worse since last office visit. Dr. Tollie Pizza gave Qvar which may help some. Continues CPAP for sleep apnea with good compliance and control. Not Parkinson, but may be related to his bipolar medicines. CXR 02/02/11-  IMPRESSION:  Generalized hyperinflation configuration consistent with COPD. No  acute superimposed abnormality.  Original Report Authenticated By: Delane Ginger, M.D.    07/15/11-  31 yoM former smoker, followed for COPD, OSA, complicated by BiPolar, HBP, hx melanoma, DM  Patient states about the same as last visit. c/o sob with exertion and  a little chest tightness. Denies chest pain, wheezing, and cough.  COPD Assessment Test (CAT) score today 9/40 6 MWT- 99%, 100%, 99%, 510 meters PFT 07/07/2011-mild obstructive airways disease, small airways, insignificant response to bronchodilator. Normal lung volumes, normal diffusion capacity. FEV1/FVC 0.61 For sleep apnea CPAP 9 is comfortable usually. In early June he had fallen and hit his head with fractured nose  so he had to stop using CPAP for a while but is getting started again. He likes [facility] which is associated with decreased cough. Dyspnea on exertion noted pulling empty trash can 75 feet up of mild slope. AeroChamber spacer helps with inhaler.  07/16/12- 78 yoM former smoker, followed for COPD, OSA, complicated by BiPolar, HBP, hx melanoma, DM FOLLOWS FOR: reports CPAP is working, wearing machine approx 6-8 hr per night FOLLOWS FOR: patient reports breathing is doing well-- states since starting [facility] voice has gotten hoarse-- denies any other concerns at this time            Wife here Happy with CPAP 9/ [facility]. Good control. No longer coughing. Dulera 100 2 puffs- hoarse despite aerochamber.  Notes DOE pulling trash can, but not new. Aware heart can contribute to DOE. Can't hold breath to sing in church.   02/22/13-  78 yoM former smoker, followed for COPD, rhinitis, OSA, complicated by BiPolar, HBP, hx melanoma, DM FOLLOWS FOR:Pt states his breathing is fair-depends on what he is doing.Pt was told to speak with CY about his OSA and heart issues   CPAP 9/ [facility] Compliant with CPAP but we aren't sure about pressure control. Insurance may want him to change maintenance inhaler. Medications were reviewed. Nasal congestion expect  worse with spring pollen  04/25/13- 80 yoM former smoker, followed for COPD, rhinitis, OSA, complicated by BiPolar, HBP, hx melanoma, DM FOLLOWS FOR:sob same,denies cough or wheeze,wearing hrt. monitor for 2 wks. now,can't afford Advair,no cp or tightness,doing good with CPAP 9/ [facility] wears avg. 8 hrs.,needs new nasal pillows Wearing recorder for cardiology because  heart rate limits exertion. Does not miss Advair and denies cough or wheeze. Rare need for rescue inhaler CXR 02/22/13 IMPRESSION:  No acute cardiopulmonary disease. COPD. No change from the prior  study.  Electronically Signed  By: Lajean Manes M.D.  On: 02/22/2013 16:24  10/25/13- 45 yoM former smoker,  followed for COPD, rhinitis, OSA, complicated by BiPolar, HBP, hx melanoma, DM, pacemaker FOLLOWS FOR: Wears CPAP 9 every night for about 8 hours; DME is [facility].  CXR  06/20/13 IMPRESSION:  No active cardiopulmonary disease. Mild emphysematous disease.  Left-sided pacemaker intact. No evidence of pneumothorax.  Electronically Signed  By: Marin Olp M.D.  On: 06/20/2013 08:31  01/20/14- 80 yoM former smoker, followed for COPD, rhinitis, OSA, complicated by BiPolar, HBP, hx melanoma, DM, pacemaker CPAP 9/ [facility]- ok Pt c/o wheezing, "hacking" cough with mucus production (pt swallows mucus, unsure of color), chest congestion and SOB. Denies treating cough with OTC meds.  Pt would like to discuss restarting Dulera  04/21/14-80 yoM former smoker, followed for COPD, rhinitis, OSA, complicated by BiPolar, HBP, hx melanoma, DM, pacemaker           wife here CPAP 9/ [facility]- ok FOLLOWS FOR:  Pt states that he he does not see where the [facility] is helping his breathing. Too expensive. Pt states that he is not feeling much better since last seen, still having cough, wheeze and mucus production(unable to expel).   ROS-see HPI Constitutional:   No-   weight loss, night sweats, fevers, chills, fatigue, lassitude. HEENT:   No-  headaches, difficulty swallowing, tooth/dental problems, sore throat,       No-  sneezing, itching, ear ache, nasal congestion, post nasal drip,  CV:  No-   chest pain, orthopnea, PND, swelling in lower extremities, anasarca, dizziness, palpitations Resp: +  shortness of breath with exertion or at rest.              No-productive cough,  No non-productive cough,  No- coughing up of blood.              No-   change in color of mucus.  No- wheezing.   Skin: No-   rash or lesions. GI:  No-   heartburn, indigestion, abdominal pain, nausea, vomiting,  GU:  MS:  + joint pain or swelling. Weakness and falls. Neuro-     tremor Psych:  No- change in mood or affect. No depression or anxiety.  No  memory loss.  OBJ General- Alert, Oriented, Affect-appropriate, Distress- none acute, looks well Skin- rash-none, lesions- none, excoriation- none Lymphadenopathy- none Head- atraumatic            Eyes- Gross vision intact, PERRLA, conjunctivae clear secretions,             Ears- Hearing, canals-normal            Nose- Clear, no-Septal dev, mucus, polyps, erosion, perforation             Throat- Mallampati II , mucosa clear , drainage- none, tonsils- atrophic Neck- flexible , trachea midline, no stridor , thyroid nl, carotid no bruit Chest - symmetrical excursion , unlabored           Heart/CV-  Pulse is RRR , no murmur , no gallop  , no rub, nl s1 s2                           - JVD- none , edema- none, stasis  changes- none, varices- none           Lung- clear, unlabored, wheeze- none, cough- none , dullness-none,                      rub- none           Chest wall-  Abd-  Br/ Gen/ Rectal- Not done, not indicated Extrem- cyanosis- none, clubbing, none, atrophy- none, strength- nl, using a cane.  Neuro- +resting tremor of hands

## 2014-04-23 NOTE — Assessment & Plan Note (Signed)
He describes good compliance and control, confirmed by his wife. We discussed comfort measures

## 2014-04-23 NOTE — Assessment & Plan Note (Signed)
He has a low-grade chronic asthmatic bronchitis which is generally adequately controlled we can watch to see how he does off of Dulera and replace with an equivalent later if needed.

## 2014-05-06 ENCOUNTER — Telehealth: Payer: Self-pay | Admitting: Internal Medicine

## 2014-05-06 DIAGNOSIS — G4733 Obstructive sleep apnea (adult) (pediatric): Secondary | ICD-10-CM

## 2014-05-06 NOTE — Telephone Encounter (Signed)
ATC the mobile number- fast busy tone  Called home number and regular busy tone x 2  WCB

## 2014-05-07 NOTE — Telephone Encounter (Signed)
ATC multiple times (with and without 336)- busy signal wcb

## 2014-05-08 NOTE — Telephone Encounter (Signed)
Okay to put order in [facility] for PCC's to look into patient changing DME companies. Pt will need to be contacted as to which DME he will be able to use based on insurance and how long he has had his CPAP through [place]. Thanks.

## 2014-05-08 NOTE — Telephone Encounter (Signed)
Order has been placed. Attempted to call pt. Line was busy. Will try back.

## 2014-05-08 NOTE — Telephone Encounter (Signed)
Called home number and spoke with patient.  Patient says that he wants to switch DME from [place] to something else.  He does not like dealing with [facility] for his CPAP supplies.  He says that he has [employer] and [employer].    Current Outpatient Prescriptions on File Prior to Visit  Medication Sig Dispense Refill  . albuterol (PROVENTIL HFA;VENTOLIN HFA) 108 (90 BASE) MCG/ACT inhaler 1-2 puffs every 6 hours if needed for shortness of breath or wheezing- rescue inhaler    . alclomethasone (ACLOVATE) 0.05 % ointment Apply 1 application topically 2 (two) times daily as needed (for rash).    . Ascorbic Acid (VITAMIN C) 1000 MG tablet Take 1,000 mg by mouth 2 (two) times daily.     Marland Kitchen aspirin 81 MG tablet Take 81 mg by mouth daily.    . carbamazepine (TEGRETOL) 200 MG tablet Take 200 mg by mouth 5 (five) times daily.     . carbidopa-levodopa (SINEMET IR) 25-100 MG per tablet Take 1 tablet by mouth 3 (three) times daily.    . cholecalciferol (VITAMIN D) 1000 UNITS tablet Take 1,000 Units by mouth daily.      Marland Kitchen desoximetasone (TOPICORT) 0.25 % cream Apply 1 application topically 2 (two) times daily.    . Flaxseed, Linseed, (FLAX SEED OIL) 1300 MG CAPS Take 1 capsule by mouth daily.     Marland Kitchen FLUoxetine (PROZAC) 20 MG tablet Take 20 mg by mouth daily.     Marland Kitchen levothyroxine (LEVOTHROID) 125 MCG tablet Take 125 mcg by mouth daily before breakfast.     . mometasone-formoterol (DULERA) 100-5 MCG/ACT AERO 2 puffs then rinse mouth, twice daily maintenance 1 Inhaler prn  . Multiple Vitamin (MULITIVITAMIN WITH MINERALS) TABS Take 1 tablet by mouth daily.    . naproxen sodium (ANAPROX) 220 MG tablet Take 440 mg by mouth daily.    . Omega-3 Fatty Acids (OMEGA-3 CF PO) Take 1,200 mg by mouth daily.     . pravastatin (PRAVACHOL) 40 MG tablet Take 40 mg by mouth at bedtime.     . SEROQUEL XR 50 MG TB24 24 hr tablet Take 50 mg by mouth at bedtime.  3  . traMADol (ULTRAM) 50 MG tablet Take 50 mg by mouth 3 (three)  times daily as needed.  5   No current facility-administered medications on file prior to visit.   No Known Allergies

## 2014-05-09 NOTE — Telephone Encounter (Signed)
Patient received another package from [place].  Patient would like to know what DME company his insurance will cover.  [facility] - please follow up on this order, it doesn't look as if anything has been done on this yet.  Thanks.

## 2014-05-09 NOTE — Telephone Encounter (Signed)
This # has been ringing busy for 2days will continue to try to reach pt [place]

## 2014-05-09 NOTE — Telephone Encounter (Signed)
Spoke to pt he has been changed to [facility] from [place] also his # was written down wrong in this message it is [place]

## 2014-05-13 ENCOUNTER — Telehealth: Payer: Self-pay | Admitting: Internal Medicine

## 2014-05-13 NOTE — Telephone Encounter (Signed)
I was able to get sleep study faxed to me from [place] with [facility] is aware that I have placed her copy in the [facility] folder at [facility] location. Melissa states she will pick this up tomorrow.   A copy for our records has been sent to scan in EPIC. Nothing more needed at this time.

## 2014-05-19 ENCOUNTER — Telehealth: Payer: Self-pay | Admitting: Internal Medicine

## 2014-05-19 DIAGNOSIS — G4733 Obstructive sleep apnea (adult) (pediatric): Secondary | ICD-10-CM

## 2014-05-19 NOTE — Telephone Encounter (Signed)
Order has been placed and [facility] aware. Nothing further needed

## 2014-06-26 ENCOUNTER — Ambulatory Visit (INDEPENDENT_AMBULATORY_CARE_PROVIDER_SITE_OTHER): Payer: Medicare Other | Admitting: *Deleted

## 2014-06-26 DIAGNOSIS — I495 Sick sinus syndrome: Secondary | ICD-10-CM

## 2014-06-26 NOTE — Progress Notes (Signed)
Remote pacemaker transmission.   

## 2014-06-30 ENCOUNTER — Telehealth: Payer: Self-pay | Admitting: Internal Medicine

## 2014-06-30 LAB — CUP PACEART REMOTE DEVICE CHECK
Battery Remaining Longevity: 138 mo
Brady Statistic RV Percent Paced: 100 %
Date Time Interrogation Session: 20160609042000
Lead Channel Impedance Value: 698 Ohm
Lead Channel Pacing Threshold Pulse Width: 0.5 ms
Lead Channel Setting Pacing Amplitude: 1.5 V
Lead Channel Setting Pacing Amplitude: 2 V
Lead Channel Setting Pacing Pulse Width: 0.4 ms
MDC IDC MSMT BATTERY REMAINING PERCENTAGE: 100 %
MDC IDC MSMT LEADCHNL RA PACING THRESHOLD AMPLITUDE: 0.8 V
MDC IDC MSMT LEADCHNL RV IMPEDANCE VALUE: 679 Ohm
MDC IDC MSMT LEADCHNL RV PACING THRESHOLD AMPLITUDE: 1 V
MDC IDC MSMT LEADCHNL RV PACING THRESHOLD PULSEWIDTH: 0.4 ms
MDC IDC PG SERIAL: 389736
MDC IDC SET LEADCHNL RV SENSING SENSITIVITY: 2.5 mV
MDC IDC STAT BRADY RA PERCENT PACED: 69 %
Zone Setting Detection Interval: 375 ms

## 2014-06-30 NOTE — Telephone Encounter (Signed)
LMTCB/SSS 

## 2014-06-30 NOTE — Telephone Encounter (Signed)
New message     Returning someone's call from last week.  Pt had a remote transmission on the 9th.

## 2014-07-01 NOTE — Telephone Encounter (Signed)
Pt returning Victor Osborne. 

## 2014-07-01 NOTE — Telephone Encounter (Signed)
Informed patient that there were no documented telephone calls in Epic. I told him that it might have been the automated svc reminding him of his remote appt. Patient voiced understanding.

## 2014-07-07 ENCOUNTER — Encounter: Payer: Self-pay | Admitting: Cardiology

## 2014-07-09 ENCOUNTER — Encounter: Payer: Self-pay | Admitting: Internal Medicine

## 2014-07-22 ENCOUNTER — Encounter: Payer: Self-pay | Admitting: Cardiology

## 2014-10-07 ENCOUNTER — Ambulatory Visit (INDEPENDENT_AMBULATORY_CARE_PROVIDER_SITE_OTHER): Payer: Medicare Other | Admitting: Internal Medicine

## 2014-10-07 ENCOUNTER — Encounter: Payer: Self-pay | Admitting: Internal Medicine

## 2014-10-07 VITALS — BP 116/70 | HR 69 | Ht 76.0 in | Wt 210.8 lb

## 2014-10-07 DIAGNOSIS — I10 Essential (primary) hypertension: Secondary | ICD-10-CM

## 2014-10-07 DIAGNOSIS — I495 Sick sinus syndrome: Secondary | ICD-10-CM | POA: Diagnosis not present

## 2014-10-07 DIAGNOSIS — I440 Atrioventricular block, first degree: Secondary | ICD-10-CM

## 2014-10-07 DIAGNOSIS — Z950 Presence of cardiac pacemaker: Secondary | ICD-10-CM

## 2014-10-07 LAB — CUP PACEART INCLINIC DEVICE CHECK
Date Time Interrogation Session: 20160920040000
Lead Channel Impedance Value: 663 Ohm
Lead Channel Impedance Value: 777 Ohm
Lead Channel Pacing Threshold Amplitude: 0.8 V
Lead Channel Pacing Threshold Amplitude: 1.2 V
Lead Channel Sensing Intrinsic Amplitude: 11.5 mV
Lead Channel Sensing Intrinsic Amplitude: 4.1 mV
Lead Channel Setting Pacing Amplitude: 1.7 V
Lead Channel Setting Sensing Sensitivity: 2.5 mV
MDC IDC MSMT LEADCHNL RA PACING THRESHOLD PULSEWIDTH: 0.5 ms
MDC IDC MSMT LEADCHNL RV PACING THRESHOLD PULSEWIDTH: 0.4 ms
MDC IDC SET LEADCHNL RA PACING AMPLITUDE: 2 V
MDC IDC SET LEADCHNL RV PACING PULSEWIDTH: 0.4 ms
MDC IDC SET ZONE DETECTION INTERVAL: 375 ms
MDC IDC STAT BRADY RA PERCENT PACED: 70 %
MDC IDC STAT BRADY RV PERCENT PACED: 100 %
Pulse Gen Serial Number: 389736

## 2014-10-07 NOTE — Progress Notes (Signed)
HPI [name] returns today for followup. He is a pleasant 81 yo man with symptomatic bradycardia, s/p PPM insertion. In the interim, he has done well. He is mowing grass weekly. No other complaints. He denies chest pain or sob.   No Known Allergies   Current Outpatient Prescriptions  Medication Sig Dispense Refill  . albuterol (PROVENTIL HFA;VENTOLIN HFA) 108 (90 BASE) MCG/ACT inhaler Inhale into the lungs. 1-2 puffs every 6 hours if needed for shortness of breath or wheezing- rescue inhaler    . alclomethasone (ACLOVATE) 0.05 % ointment Apply 1 application topically 2 (two) times daily as needed (for rash).    . Ascorbic Acid (VITAMIN C) 1000 MG tablet Take 1,000 mg by mouth 2 (two) times daily.     Marland Kitchen aspirin 81 MG tablet Take 81 mg by mouth daily.    . carbamazepine (TEGRETOL) 200 MG tablet Take 200 mg by mouth 5 (five) times daily.     . carbidopa-levodopa (SINEMET IR) 25-100 MG per tablet Take 1 tablet by mouth 3 (three) times daily.    . cholecalciferol (VITAMIN D) 1000 UNITS tablet Take 2,000 Units by mouth daily.     Marland Kitchen desoximetasone (TOPICORT) 0.25 % cream Apply 1 application topically 2 (two) times daily.    . Flaxseed, Linseed, (FLAX SEED OIL) 1300 MG CAPS Take 1 capsule by mouth daily.     Marland Kitchen FLUoxetine (PROZAC) 20 MG tablet Take 20 mg by mouth daily.     Marland Kitchen levothyroxine (LEVOTHROID) 125 MCG tablet Take 125 mcg by mouth daily before breakfast.     . mometasone-formoterol (DULERA) 100-5 MCG/ACT AERO Inhale 2 puffs into the lungs 2 (two) times daily. Then rinse mouth    . Multiple Vitamin (MULITIVITAMIN WITH MINERALS) TABS Take 1 tablet by mouth daily.    . naproxen sodium (ANAPROX) 220 MG tablet Take 440 mg by mouth daily as needed (pain).     . Omega-3 Fatty Acids (OMEGA-3 CF PO) Take 1,200 mg by mouth daily.     . pravastatin (PRAVACHOL) 40 MG tablet Take 40 mg by mouth at bedtime.     . SEROQUEL XR 50 MG TB24 24 hr tablet Take 50 mg by mouth at bedtime.  3  . traMADol  (ULTRAM) 50 MG tablet Take 50 mg by mouth 3 (three) times daily as needed (pain).   5   No current facility-administered medications for this visit.     Past Medical History  Diagnosis Date  . UPJ obstruction, congenital   . COPD (chronic obstructive pulmonary disease)   . Bipolar 1 disorder   . Pneumonia 1970  . Depression   . Thyroid disease   . Hypertension     pt denies but was in medical record  . Skin cancer (melanoma)   . First degree AV block   . Unspecified hypothyroidism   . Myalgia and myositis, unspecified   . Obesity   . Hypoglycemia, unspecified     ROS:   All systems reviewed and negative except as noted in the HPI.   Past Surgical History  Procedure Laterality Date  . Appendectomy  1939  . Urethra surgery  2010    reconstruction  . Right big toe  surgery  2009  . Malignant melanoma removed from right forehead  2008  . Transurethral resection of prostate  02/04/2011    Procedure: TRANSURETHRAL RESECTION OF THE PROSTATE (TURP);  Surgeon: Dutch Gray, MD;  Location: [facility] ORS;  Service: Urology;  Laterality:  N/A;  . Permanent pacemaker insertion N/A 06/19/2013    Procedure: PERMANENT PACEMAKER INSERTION;  Surgeon: Evans Lance, MD;  Location: [facility] CATH LAB;  Service: Cardiovascular;  Laterality: N/A;     Family History  Problem Relation Age of Onset  . Cancer Mother      Social History   Social History  . Marital Status: Married    Spouse Name: N/A  . Number of Children: N/A  . Years of Education: N/A   Occupational History  . Not on file.   Social History Main Topics  . Smoking status: Former Smoker -- 2.00 packs/day for 40 years    Types: Cigarettes  . Smokeless tobacco: Never Used  . Alcohol Use: No  . Drug Use: No  . Sexual Activity: Not on file   Other Topics Concern  . Not on file   Social History Narrative     BP 116/70 mmHg  Pulse 69  Ht 6\' 4"  (1.93 m)  Wt 210 lb 12.8 oz (95.618 kg)  BMI 25.67 kg/m2  Physical Exam:  Well  appearing 81 yo man, NAD HEENT: Unremarkable Neck:  No JVD, no thyromegally Back:  No CVA tenderness Lungs:  Clear with no wheezes HEART:  Regular rate rhythm, no murmurs, no rubs, no clicks Abd:  soft, positive bowel sounds, no organomegally, no rebound, no guarding Ext:  2 plus pulses, no edema, no cyanosis, no clubbing Skin:  No rashes no nodules Neuro:  CN II through XII intact, motor grossly intact, mild tremor today   DEVICE  Normal device function.  See PaceArt for details.   Assess/Plan:

## 2014-10-07 NOTE — Assessment & Plan Note (Signed)
He will continue his statin and maintain a low fat diet.

## 2014-10-07 NOTE — Patient Instructions (Signed)
Medication Instructions:  Your physician recommends that you continue on your current medications as directed. Please refer to the Current Medication list given to you today.   Labwork: None ordered  Testing/Procedures: None ordered  Follow-Up: Your physician wants you to follow-up in:  12 months with Dr Taylor You will receive a reminder letter in the mail two months in advance. If you don't receive a letter, please call our office to schedule the follow-up appointment.  Remote monitoring is used to monitor your Pacemaker  from home. This monitoring reduces the number of office visits required to check your device to one time per year. It allows us to keep an eye on the functioning of your device to ensure it is working properly. You are scheduled for a device check from home on 01/06/15. You may send your transmission at any time that day. If you have a wireless device, the transmission will be sent automatically. After your physician reviews your transmission, you will receive a postcard with your next transmission date.     Any Other Special Instructions Will Be Listed Below (If Applicable).   

## 2014-10-07 NOTE — Assessment & Plan Note (Signed)
His [employer] DDD PM is working normally. Will recheck in several months.

## 2014-10-07 NOTE — Assessment & Plan Note (Signed)
His blood pressure is well controlled. Will continue his current meds and maintain a low sodium diet.

## 2014-10-22 ENCOUNTER — Encounter: Payer: Self-pay | Admitting: Internal Medicine

## 2014-10-27 ENCOUNTER — Ambulatory Visit: Payer: Medicare Other | Admitting: Internal Medicine

## 2015-01-06 ENCOUNTER — Ambulatory Visit (INDEPENDENT_AMBULATORY_CARE_PROVIDER_SITE_OTHER): Payer: Medicare Other | Admitting: *Deleted

## 2015-01-06 DIAGNOSIS — I495 Sick sinus syndrome: Secondary | ICD-10-CM | POA: Diagnosis not present

## 2015-01-06 NOTE — Progress Notes (Signed)
Remote pacemaker transmission.   

## 2015-01-13 LAB — CUP PACEART REMOTE DEVICE CHECK
Battery Remaining Longevity: 138 mo
Battery Remaining Percentage: 100 %
Brady Statistic RA Percent Paced: 78 %
Brady Statistic RV Percent Paced: 100 %
Implantable Lead Implant Date: 20150603
Implantable Lead Implant Date: 20150603
Implantable Lead Location: 753860
Implantable Lead Model: 4137
Implantable Lead Serial Number: 29477746
Lead Channel Impedance Value: 652 Ohm
Lead Channel Pacing Threshold Amplitude: 1.1 V
Lead Channel Pacing Threshold Pulse Width: 0.4 ms
Lead Channel Setting Pacing Pulse Width: 0.4 ms
Lead Channel Setting Sensing Sensitivity: 2.5 mV
MDC IDC LEAD LOCATION: 753859
MDC IDC LEAD SERIAL: 29617326
MDC IDC MSMT LEADCHNL RA IMPEDANCE VALUE: 671 Ohm
MDC IDC SESS DTM: 20161220052100
MDC IDC SET LEADCHNL RA PACING AMPLITUDE: 2 V
MDC IDC SET LEADCHNL RV PACING AMPLITUDE: 1.5 V
Pulse Gen Serial Number: 389736

## 2015-01-15 ENCOUNTER — Encounter: Payer: Self-pay | Admitting: Cardiology

## 2015-02-06 ENCOUNTER — Encounter: Payer: Self-pay | Admitting: Internal Medicine

## 2015-02-06 ENCOUNTER — Ambulatory Visit (INDEPENDENT_AMBULATORY_CARE_PROVIDER_SITE_OTHER): Payer: Medicare Other | Admitting: Internal Medicine

## 2015-02-06 VITALS — BP 132/80 | HR 71 | Ht 76.0 in | Wt 206.0 lb

## 2015-02-06 DIAGNOSIS — G4733 Obstructive sleep apnea (adult) (pediatric): Secondary | ICD-10-CM

## 2015-02-06 NOTE — Assessment & Plan Note (Signed)
He is comfortable with CPAP at 9, used all night every night. His wife agrees. Quality of life is improved.

## 2015-02-06 NOTE — Assessment & Plan Note (Signed)
He paces himself without significant wheeze or cough and has not felt need to use any inhalers

## 2015-02-06 NOTE — Patient Instructions (Signed)
We can continue CPAP 9/ [facility]  Please call if we can help

## 2015-02-06 NOTE — Progress Notes (Signed)
12/28/10- 78 yoM former smoker, followed for COPD, OSA, complicated by BiPolar, HBP, hx melanoma, DM LOV-12/19/2007 Has had flu vaccine. He had changed his CPAP company to [employer], but complains that his mask is old and worn out he can't wear it. Therefore he has not used CPAP in some months. His wife tells him that without CPAP he snores loudly and talks in his sleep. He recognizes he is not sleeping as well. His COPD has been stable with insignificant cough and wheeze. He denies noticing shortness of breath while walking. He has no inhaled medications. CXR-04/09/2009-COPD, no active process.  06/14/11- 49 yoM former smoker, followed for COPD, OSA, complicated by BiPolar, HBP, hx melanoma, DM Cough from time to time: CAT score of 13; Was given QVAR from PCP and can tell slight difference-? aerochamber to use with it. Says he has had productive cough for about a year. Some increased shortness of breath making it hard to say. Gradually worse since last office visit. Dr. Tollie Pizza gave Qvar which may help some. Continues CPAP for sleep apnea with good compliance and control. Not Parkinson, but may be related to his bipolar medicines. CXR 02/02/11-  IMPRESSION:  Generalized hyperinflation configuration consistent with COPD. No  acute superimposed abnormality.  Original Report Authenticated By: Delane Ginger, M.D.    07/15/11-  31 yoM former smoker, followed for COPD, OSA, complicated by BiPolar, HBP, hx melanoma, DM  Patient states about the same as last visit. c/o sob with exertion and  a little chest tightness. Denies chest pain, wheezing, and cough.  COPD Assessment Test (CAT) score today 9/40 6 MWT- 99%, 100%, 99%, 510 meters PFT 07/07/2011-mild obstructive airways disease, small airways, insignificant response to bronchodilator. Normal lung volumes, normal diffusion capacity. FEV1/FVC 0.61 For sleep apnea CPAP 9 is comfortable usually. In early June he had fallen and hit his head with fractured nose  so he had to stop using CPAP for a while but is getting started again. He likes [facility] which is associated with decreased cough. Dyspnea on exertion noted pulling empty trash can 75 feet up of mild slope. AeroChamber spacer helps with inhaler.  07/16/12- 78 yoM former smoker, followed for COPD, OSA, complicated by BiPolar, HBP, hx melanoma, DM FOLLOWS FOR: reports CPAP is working, wearing machine approx 6-8 hr per night FOLLOWS FOR: patient reports breathing is doing well-- states since starting [facility] voice has gotten hoarse-- denies any other concerns at this time            Wife here Happy with CPAP 9/ [employer]. Good control. No longer coughing. Dulera 100 2 puffs- hoarse despite aerochamber.  Notes DOE pulling trash can, but not new. Aware heart can contribute to DOE. Can't hold breath to sing in church.   02/22/13-  78 yoM former smoker, followed for COPD, rhinitis, OSA, complicated by BiPolar, HBP, hx melanoma, DM FOLLOWS FOR:Pt states his breathing is fair-depends on what he is doing.Pt was told to speak with CY about his OSA and heart issues   CPAP 9/ [facility] Compliant with CPAP but we aren't sure about pressure control. Insurance may want him to change maintenance inhaler. Medications were reviewed. Nasal congestion expect  worse with spring pollen  04/25/13- 80 yoM former smoker, followed for COPD, rhinitis, OSA, complicated by BiPolar, HBP, hx melanoma, DM FOLLOWS FOR:sob same,denies cough or wheeze,wearing hrt. monitor for 2 wks. now,can't afford Advair,no cp or tightness,doing good with CPAP 9/ [employer] wears avg. 8 hrs.,needs new nasal pillows Wearing recorder for cardiology because  heart rate limits exertion. Does not miss Advair and denies cough or wheeze. Rare need for rescue inhaler CXR 02/22/13 IMPRESSION:  No acute cardiopulmonary disease. COPD. No change from the prior  study.  Electronically Signed  By: Lajean Manes M.D.  On: 02/22/2013 16:24  10/25/13- 24 yoM former smoker,  followed for COPD, rhinitis, OSA, complicated by BiPolar, HBP, hx melanoma, DM, pacemaker FOLLOWS FOR: Wears CPAP 9 every night for about 8 hours; DME is [facility].  CXR  06/20/13 IMPRESSION:  No active cardiopulmonary disease. Mild emphysematous disease.  Left-sided pacemaker intact. No evidence of pneumothorax.  Electronically Signed  By: Marin Olp M.D.  On: 06/20/2013 08:31  01/20/14- 80 yoM former smoker, followed for COPD, rhinitis, OSA, complicated by BiPolar, HBP, hx melanoma, DM, pacemaker CPAP 9/ [facility]- ok Pt c/o wheezing, "hacking" cough with mucus production (pt swallows mucus, unsure of color), chest congestion and SOB. Denies treating cough with OTC meds.  Pt would like to discuss restarting Dulera  04/21/14-80 yoM former smoker, followed for COPD, rhinitis, OSA, complicated by BiPolar, HBP, hx melanoma, DM, pacemaker           wife here CPAP 9/ [employer]- ok FOLLOWS FOR:  Pt states that he he does not see where the [employer] is helping his breathing. Too expensive. Pt states that he is not feeling much better since last seen, still having cough, wheeze and mucus production(unable to expel).   02/06/2015-82 year old male former smoker followed for COPD, rhinitis, OSA, complicated by bipolar, HBP, history melanoma, DM, pacemaker CPAP 9/[facility] FOLLOWS FOR:  Wears CPAP nightly. Denies any problems with mask or pressure. Pt states that breathing is doing good - no new complaints. Now using [facility] Says his breathing is "fine" paces himself but does some yard work. Never needs inhalers.  ROS-see HPI Constitutional:   No-   weight loss, night sweats, fevers, chills, fatigue, lassitude. HEENT:   No-  headaches, difficulty swallowing, tooth/dental problems, sore throat,       No-  sneezing, itching, ear ache, nasal congestion, post nasal drip,  CV:  No-   chest pain, orthopnea, PND, swelling in lower extremities, anasarca, dizziness, palpitations Resp: +  shortness of breath with exertion  or at rest.              No-productive cough,  No non-productive cough,  No- coughing up of blood.              No-   change in color of mucus.  No- wheezing.   Skin: No-   rash or lesions. GI:  No-   heartburn, indigestion, abdominal pain, nausea, vomiting,  GU:  MS:  + joint pain or swelling. Weakness and falls. Neuro-     tremor Psych:  No- change in mood or affect. No depression or anxiety.  No memory loss.  OBJ General- Alert, Oriented, Affect-appropriate, Distress- none acute, looks well Skin- rash-none, lesions- none, excoriation- none Lymphadenopathy- none Head- atraumatic            Eyes- Gross vision intact, PERRLA, conjunctivae clear secretions,             Ears- Hearing, canals-normal            Nose- Clear, no-Septal dev, mucus, polyps, erosion, perforation             Throat- Mallampati II , mucosa clear , drainage- none, tonsils- atrophic Neck- flexible , trachea midline, no stridor , thyroid nl, carotid no bruit Chest - symmetrical excursion ,  unlabored           Heart/CV-  Pulse is RRR occasional skip , no murmur , no gallop  , no rub, nl s1 s2                           - JVD- none , edema- none, stasis changes- none, varices- none           Lung- clear, unlabored, wheeze- none, cough- none , dullness-none,  rub- none           Chest wall- + left pacemaker Abd-  Br/ Gen/ Rectal- Not done, not indicated Extrem- cyanosis- none, clubbing, none, atrophy- none, strength- nl, using a cane.  Neuro- +resting tremor of hands

## 2015-03-20 DIAGNOSIS — M1990 Unspecified osteoarthritis, unspecified site: Secondary | ICD-10-CM | POA: Insufficient documentation

## 2015-03-20 DIAGNOSIS — R251 Tremor, unspecified: Secondary | ICD-10-CM | POA: Insufficient documentation

## 2015-03-20 DIAGNOSIS — F319 Bipolar disorder, unspecified: Secondary | ICD-10-CM | POA: Diagnosis present

## 2015-04-07 ENCOUNTER — Ambulatory Visit (INDEPENDENT_AMBULATORY_CARE_PROVIDER_SITE_OTHER): Payer: Medicare Other | Admitting: *Deleted

## 2015-04-07 DIAGNOSIS — I495 Sick sinus syndrome: Secondary | ICD-10-CM

## 2015-04-08 NOTE — Progress Notes (Signed)
Remote pacemaker transmission.   

## 2015-04-21 ENCOUNTER — Ambulatory Visit: Payer: Medicare Other | Admitting: Internal Medicine

## 2015-05-19 LAB — CUP PACEART REMOTE DEVICE CHECK
Battery Remaining Longevity: 132 mo
Battery Remaining Percentage: 100 %
Brady Statistic RA Percent Paced: 77 %
Brady Statistic RV Percent Paced: 100 %
Date Time Interrogation Session: 20170321042100
Implantable Lead Implant Date: 20150603
Implantable Lead Location: 753859
Implantable Lead Location: 753860
Implantable Lead Model: 4136
Implantable Lead Serial Number: 29617326
Lead Channel Impedance Value: 691 Ohm
Lead Channel Pacing Threshold Pulse Width: 0.4 ms
Lead Channel Setting Pacing Amplitude: 1.6 V
Lead Channel Setting Pacing Amplitude: 2 V
Lead Channel Setting Sensing Sensitivity: 2.5 mV
MDC IDC LEAD IMPLANT DT: 20150603
MDC IDC LEAD MODEL: 4137
MDC IDC LEAD SERIAL: 29477746
MDC IDC MSMT LEADCHNL RV IMPEDANCE VALUE: 630 Ohm
MDC IDC MSMT LEADCHNL RV PACING THRESHOLD AMPLITUDE: 1.2 V
MDC IDC SET LEADCHNL RV PACING PULSEWIDTH: 0.4 ms
Pulse Gen Serial Number: 389736

## 2015-05-27 ENCOUNTER — Encounter: Payer: Self-pay | Admitting: Cardiology

## 2015-06-10 ENCOUNTER — Encounter: Payer: Self-pay | Admitting: Cardiology

## 2015-07-07 ENCOUNTER — Telehealth: Payer: Self-pay | Admitting: Cardiology

## 2015-07-07 ENCOUNTER — Ambulatory Visit (INDEPENDENT_AMBULATORY_CARE_PROVIDER_SITE_OTHER): Payer: Medicare Other | Admitting: *Deleted

## 2015-07-07 DIAGNOSIS — I495 Sick sinus syndrome: Secondary | ICD-10-CM | POA: Diagnosis not present

## 2015-07-07 NOTE — Progress Notes (Signed)
Remote pacemaker transmission.   

## 2015-07-07 NOTE — Telephone Encounter (Signed)
Spoke with pt and reminded pt of remote transmission that is due today. Pt verbalized understanding.   

## 2015-07-08 LAB — CUP PACEART REMOTE DEVICE CHECK
Brady Statistic RA Percent Paced: 77 %
Brady Statistic RV Percent Paced: 100 %
Date Time Interrogation Session: 20170620042100
Implantable Lead Implant Date: 20150603
Implantable Lead Implant Date: 20150603
Implantable Lead Location: 753859
Implantable Lead Model: 4136
Implantable Lead Model: 4137
Implantable Lead Serial Number: 29477746
Lead Channel Impedance Value: 614 Ohm
Lead Channel Impedance Value: 682 Ohm
Lead Channel Sensing Intrinsic Amplitude: 13 mV
Lead Channel Setting Pacing Amplitude: 1.6 V
Lead Channel Setting Pacing Pulse Width: 0.4 ms
MDC IDC LEAD LOCATION: 753860
MDC IDC LEAD SERIAL: 29617326
MDC IDC MSMT BATTERY REMAINING LONGEVITY: 126 mo
MDC IDC MSMT BATTERY REMAINING PERCENTAGE: 100 %
MDC IDC MSMT LEADCHNL RV PACING THRESHOLD AMPLITUDE: 1.1 V
MDC IDC MSMT LEADCHNL RV PACING THRESHOLD PULSEWIDTH: 0.4 ms
MDC IDC SET LEADCHNL RA PACING AMPLITUDE: 2 V
MDC IDC SET LEADCHNL RV SENSING SENSITIVITY: 2.5 mV
Pulse Gen Serial Number: 389736

## 2015-07-10 ENCOUNTER — Encounter: Payer: Self-pay | Admitting: Cardiology

## 2015-07-14 ENCOUNTER — Encounter (HOSPITAL_BASED_OUTPATIENT_CLINIC_OR_DEPARTMENT_OTHER): Payer: Self-pay | Admitting: *Deleted

## 2015-07-14 ENCOUNTER — Emergency Department (HOSPITAL_BASED_OUTPATIENT_CLINIC_OR_DEPARTMENT_OTHER)
Admission: EM | Admit: 2015-07-14 | Discharge: 2015-07-14 | Disposition: A | Discharge status: Home / Self Care | Payer: Medicare Other | Attending: Emergency Medicine | Admitting: Emergency Medicine

## 2015-07-14 ENCOUNTER — Emergency Department (HOSPITAL_BASED_OUTPATIENT_CLINIC_OR_DEPARTMENT_OTHER): Payer: Medicare Other

## 2015-07-14 DIAGNOSIS — Z6824 Body mass index (BMI) 24.0-24.9, adult: Secondary | ICD-10-CM | POA: Insufficient documentation

## 2015-07-14 DIAGNOSIS — Z85828 Personal history of other malignant neoplasm of skin: Secondary | ICD-10-CM | POA: Diagnosis not present

## 2015-07-14 DIAGNOSIS — Z87891 Personal history of nicotine dependence: Secondary | ICD-10-CM | POA: Insufficient documentation

## 2015-07-14 DIAGNOSIS — Z7982 Long term (current) use of aspirin: Secondary | ICD-10-CM | POA: Diagnosis not present

## 2015-07-14 DIAGNOSIS — I10 Essential (primary) hypertension: Secondary | ICD-10-CM | POA: Insufficient documentation

## 2015-07-14 DIAGNOSIS — E039 Hypothyroidism, unspecified: Secondary | ICD-10-CM | POA: Diagnosis not present

## 2015-07-14 DIAGNOSIS — F319 Bipolar disorder, unspecified: Secondary | ICD-10-CM | POA: Diagnosis not present

## 2015-07-14 DIAGNOSIS — E119 Type 2 diabetes mellitus without complications: Secondary | ICD-10-CM | POA: Insufficient documentation

## 2015-07-14 DIAGNOSIS — M79662 Pain in left lower leg: Secondary | ICD-10-CM | POA: Diagnosis not present

## 2015-07-14 DIAGNOSIS — J449 Chronic obstructive pulmonary disease, unspecified: Secondary | ICD-10-CM | POA: Insufficient documentation

## 2015-07-14 DIAGNOSIS — Z79899 Other long term (current) drug therapy: Secondary | ICD-10-CM | POA: Insufficient documentation

## 2015-07-14 NOTE — ED Provider Notes (Signed)
CSN: [id number]     Arrival date & time 07/14/15  1617 History   First MD Initiated Contact with Patient 07/14/15 1628     Chief Complaint  Patient presents with  . Leg Pain    (Consider location/radiation/quality/duration/timing/severity/associated sxs/prior Treatment) Patient is a 82 y.o. male presenting with leg pain. The history is provided by the patient and medical records. No language interpreter was used.  Leg Pain Associated symptoms: no fever    Victor Osborne is a 82 y.o. male  with a PMH of COPD, bipolar who presents to the Emergency Department complaining of worsening achy left foot pain which radiates up posterior leg into calf x 3 days. Patient states he fell a few weeks ago and landed on his left side. He did not get evaluated by a provider after fall, but states that he "took it easy" and rested for a few days. He takes a baby aspirin daily. He was seen by his primary care physician for this today sent over to rule out DVT. He was given 3 more baby aspirin today prior to leaving physician's office. He denies chest pain or shortness of breath. Denies fever or redness of the extremity. No medications taken prior to arrival for symptoms. Pain is worse first thing in the morning, and gradually improves throughout the day as he moves around.   Past Medical History  Diagnosis Date  . UPJ obstruction, congenital   . COPD (chronic obstructive pulmonary disease) ([place])   . Bipolar 1 disorder ([place])   . Pneumonia 1970  . Depression   . Thyroid disease   . Hypertension     pt denies but was in medical record  . Skin cancer (melanoma) ([place])   . First degree AV block   . Unspecified hypothyroidism   . Myalgia and myositis, unspecified   . Obesity   . Hypoglycemia, unspecified    Past Surgical History  Procedure Laterality Date  . Appendectomy  1939  . Urethra surgery  2010    reconstruction  . Right big toe  surgery  2009  . Malignant melanoma removed from right forehead   2008  . Transurethral resection of prostate  02/04/2011    Procedure: TRANSURETHRAL RESECTION OF THE PROSTATE (TURP);  Surgeon: Dutch Gray, MD;  Location: [facility] ORS;  Service: Urology;  Laterality: N/A;  . Permanent pacemaker insertion N/A 06/19/2013    Procedure: PERMANENT PACEMAKER INSERTION;  Surgeon: Evans Lance, MD;  Location: [facility] CATH LAB;  Service: Cardiovascular;  Laterality: N/A;   Family History  Problem Relation Age of Onset  . Cancer Mother    Social History  Substance Use Topics  . Smoking status: Former Smoker -- 2.00 packs/day for 40 years    Types: Cigarettes  . Smokeless tobacco: Never Used  . Alcohol Use: No    Review of Systems  Constitutional: Negative for fever and chills.  HENT: Negative for congestion.   Eyes: Negative for visual disturbance.  Respiratory: Negative for cough and shortness of breath.   Cardiovascular: Negative.   Gastrointestinal: Negative for abdominal pain.  Genitourinary: Negative for dysuria.  Musculoskeletal: Positive for myalgias.  Skin: Negative for color change and rash.  Neurological: Negative for headaches.      Allergies  Review of patient's allergies indicates no known allergies.  Home Medications   Prior to Admission medications   Medication Sig Start Date End Date Taking? Authorizing Provider  albuterol (PROVENTIL HFA;VENTOLIN HFA) 108 (90 BASE) MCG/ACT inhaler Inhale into the lungs.  1-2 puffs every 6 hours if needed for shortness of breath or wheezing- rescue inhaler 07/16/12   Deneise Lever, MD  alclomethasone (ACLOVATE) 0.05 % ointment Apply 1 application topically 2 (two) times daily as needed (for rash).    Historical Provider, MD  Ascorbic Acid (VITAMIN C) 1000 MG tablet Take 1,000 mg by mouth 2 (two) times daily.     Historical Provider, MD  aspirin 81 MG tablet Take 81 mg by mouth daily.    Historical Provider, MD  carbamazepine (TEGRETOL) 200 MG tablet Take 200 mg by mouth 5 (five) times daily.  10/20/10    Historical Provider, MD  carbidopa-levodopa (SINEMET IR) 25-100 MG per tablet Take 1 tablet by mouth 3 (three) times daily.    Historical Provider, MD  cholecalciferol (VITAMIN D) 1000 UNITS tablet Take 2,000 Units by mouth daily.     Historical Provider, MD  desoximetasone (TOPICORT) 0.25 % cream Apply 1 application topically 2 (two) times daily.    Historical Provider, MD  Flaxseed, Linseed, (FLAX SEED OIL) 1300 MG CAPS Take 1 capsule by mouth daily.     Historical Provider, MD  FLUoxetine (PROZAC) 20 MG tablet Take 20 mg by mouth daily.     Historical Provider, MD  levothyroxine (LEVOTHROID) 125 MCG tablet Take 125 mcg by mouth daily before breakfast.     Historical Provider, MD  mometasone-formoterol (DULERA) 100-5 MCG/ACT AERO Inhale 2 puffs into the lungs 2 (two) times daily. Then rinse mouth    Historical Provider, MD  Multiple Vitamin (MULITIVITAMIN WITH MINERALS) TABS Take 1 tablet by mouth daily.    Historical Provider, MD  naproxen sodium (ANAPROX) 220 MG tablet Take 440 mg by mouth daily as needed (pain).     Historical Provider, MD  Omega-3 Fatty Acids (OMEGA-3 CF PO) Take 1,200 mg by mouth daily.     Historical Provider, MD  pravastatin (PRAVACHOL) 40 MG tablet Take 40 mg by mouth at bedtime.     Historical Provider, MD  SEROQUEL XR 50 MG TB24 24 hr tablet Take 50 mg by mouth at bedtime. 12/27/13   Historical Provider, MD  traMADol (ULTRAM) 50 MG tablet Take 50 mg by mouth 3 (three) times daily as needed (pain).  01/21/14   Historical Provider, MD   BP 143/72 mmHg  Pulse 59  Temp(Src) 97.6 F (36.4 C) (Oral)  Resp 16  Ht 6' 3.25" (1.911 m)  Wt 90.719 kg  BMI 24.84 kg/m2  SpO2 100% Physical Exam  Constitutional: He is oriented to person, place, and time. He appears well-developed and well-nourished.  Alert and in no acute distress  HENT:  Head: Normocephalic and atraumatic.  Cardiovascular: Normal rate, regular rhythm, normal heart sounds and intact distal pulses.  Exam  reveals no gallop and no friction rub.   No murmur heard. Pulmonary/Chest: Effort normal and breath sounds normal. No respiratory distress. He has no wheezes. He has no rales. He exhibits no tenderness.  Abdominal: Soft. Bowel sounds are normal. He exhibits no distension and no mass. There is no tenderness. There is no rebound and no guarding.  Musculoskeletal:  LLE with full ROM. TTP of calf. No swelling or erythema appreciated. 2+ DP and sensation intact.   Neurological: He is alert and oriented to person, place, and time.  Skin: Skin is warm and dry.  Nursing note and vitals reviewed.   ED Course  Procedures (including critical care time) Labs Review Labs Reviewed - No data to display  Imaging Review No results  found. I have personally reviewed and evaluated these images and lab results as part of my medical decision-making.   EKG Interpretation None      MDM   Final diagnoses:  Calf pain, left   Kandice Robinsons was sent to ED by PCP for LLE foot and calf pain concerned for DVT. Patient had injury a few weeks ago and has been resting with decreased activity level since that time. Symptoms have been going on 3 days now and progressively worsening. No erythema or concerns for infectious etiology. Agree with PCP and have ordered Doppler to assess for DVT. Patient was given aspirin prior to arrival. DVT study negative. PCP follow up recommended. Reasons to return to ED discussed. All questions answered.  Patient seen by and discussed with Dr. Tyrone Nine who agrees with treatment plan.   [facility], PA-C 07/14/15 [place], DO 07/14/15 2309

## 2015-07-14 NOTE — Discharge Instructions (Signed)
Your DVT study today was negative. Follow up with your primary care physician in regard's to today's visit.  Return to ER for any new or worsening symptoms, any additional concerns.

## 2015-07-14 NOTE — ED Notes (Signed)
Pt c/o left foot pain x 1 week after injury , sent here from [place] office for doppler r/t DVT

## 2015-07-14 NOTE — ED Notes (Signed)
Patient transported to Ultrasound 

## 2015-07-24 ENCOUNTER — Encounter: Payer: Self-pay | Admitting: Cardiology

## 2015-09-14 ENCOUNTER — Telehealth: Payer: Self-pay | Admitting: Family Medicine

## 2015-09-14 NOTE — Telephone Encounter (Signed)
Ok to schedule new patient visit

## 2015-09-14 NOTE — Telephone Encounter (Signed)
Pt's wife, ann finke sees you and would like to know if you will accept this pt? He had been seeing dr Tollie Pizza is [place] and he is retiring.  Please advise. Thanks.

## 2015-10-01 ENCOUNTER — Encounter: Payer: Self-pay | Admitting: Family Medicine

## 2015-10-01 ENCOUNTER — Other Ambulatory Visit: Payer: Self-pay | Admitting: Family Medicine

## 2015-10-01 ENCOUNTER — Ambulatory Visit (INDEPENDENT_AMBULATORY_CARE_PROVIDER_SITE_OTHER): Payer: Medicare Other | Admitting: Family Medicine

## 2015-10-01 VITALS — BP 110/60 | HR 68 | Temp 97.8°F | Ht 75.25 in | Wt 210.6 lb

## 2015-10-01 DIAGNOSIS — E039 Hypothyroidism, unspecified: Secondary | ICD-10-CM | POA: Diagnosis not present

## 2015-10-01 DIAGNOSIS — Z23 Encounter for immunization: Secondary | ICD-10-CM | POA: Diagnosis not present

## 2015-10-01 DIAGNOSIS — F3176 Bipolar disorder, in full remission, most recent episode depressed: Secondary | ICD-10-CM

## 2015-10-01 DIAGNOSIS — J449 Chronic obstructive pulmonary disease, unspecified: Secondary | ICD-10-CM

## 2015-10-01 DIAGNOSIS — E785 Hyperlipidemia, unspecified: Secondary | ICD-10-CM | POA: Diagnosis not present

## 2015-10-01 DIAGNOSIS — G4733 Obstructive sleep apnea (adult) (pediatric): Secondary | ICD-10-CM

## 2015-10-01 DIAGNOSIS — I495 Sick sinus syndrome: Secondary | ICD-10-CM | POA: Diagnosis not present

## 2015-10-01 DIAGNOSIS — G20 Parkinson's disease: Secondary | ICD-10-CM

## 2015-10-01 DIAGNOSIS — R739 Hyperglycemia, unspecified: Secondary | ICD-10-CM

## 2015-10-01 DIAGNOSIS — I10 Essential (primary) hypertension: Secondary | ICD-10-CM | POA: Diagnosis not present

## 2015-10-01 DIAGNOSIS — Z950 Presence of cardiac pacemaker: Secondary | ICD-10-CM

## 2015-10-01 DIAGNOSIS — Z85820 Personal history of malignant melanoma of skin: Secondary | ICD-10-CM

## 2015-10-01 MED ORDER — LEVOTHYROXINE SODIUM 125 MCG PO TABS: 125.0000 ug | ORAL_TABLET | Freq: Every day | ORAL | 3 refills | Status: DC

## 2015-10-01 MED ORDER — CARBIDOPA-LEVODOPA 25-100 MG PO TABS: 1.0000 | ORAL_TABLET | Freq: Four times a day (QID) | ORAL | 0 refills | Status: DC

## 2015-10-01 MED ORDER — PRAVASTATIN SODIUM 40 MG PO TABS
40.0000 mg | ORAL_TABLET | Freq: Every day | ORAL | 3 refills | Status: DC
Start: 2015-10-01 — End: 2021-01-12

## 2015-10-01 NOTE — Progress Notes (Signed)
Pre visit review using our clinic review tool, if applicable. No additional management support is needed unless otherwise documented below in the visit note. 

## 2015-10-01 NOTE — Patient Instructions (Addendum)
BEFORE YOU LEAVE: -follow up: 3-4 months -labs  -We placed a referral for you as discussed to the neurologist. It usually takes about 1-2 weeks to process and schedule this referral. If you have not heard from [place] regarding this appointment in 2 weeks please contact our office.   We recommend the following healthy lifestyle for LIFE: 1) Small portions.   Tip: eat off of a salad plate instead of a dinner plate.  Tip: It is ok to feel hungry after a meal - that likely means you ate an appropriate portion.  Tip: if you need more or a snack choose fruits, veggies and/or a handful of nuts or seeds.  2) Eat a healthy clean diet.  * Tip: Avoid (less then 1 serving per week): processed foods, sweets, sweetened drinks, white starches (rice, flour, bread, potatoes, pasta, etc), red meat, fast foods, butter  *Tip: CHOOSE instead   * 5-9 servings per day of fresh or frozen fruits and vegetables (but not corn, potatoes, bananas, canned or dried fruit)   *nuts and seeds, beans   *olives and olive oil   *small portions of lean meats such as fish and white chicken    *small portions of whole grains  3)Get at least 150 minutes of sweaty aerobic exercise per week.  4)Reduce stress - consider counseling, meditation and relaxation to balance other aspects of your life.    Marland Kitchen

## 2015-10-01 NOTE — Progress Notes (Signed)
HPI:  MAJID BOEHL is here to establish care. Used to see Dr. Marlyn Corporal. Thinks had medicare visit recently, but feels has not done labs in some time. Not fasting.  Has the following chronic problems that require follow up and concerns today:  COPD with emphysema; OSA: -sees pulmonologist -meds: none -long hx smoking, quit > 20 years ago  symptomatic brady s/p pacemaker placement/sinoatrial node dysfunction/HLD: -sees cardiologist -meds: pravastatin  Hx Malignant Melanoma/Psoriasis: -managed by dermatolgist -Dr. Delman Cheadle - D[facility] Specialist  UPJ congenital obstruction/Prostate hypertrophy: -sees [facility]  Bipolar disorder: -managed by Dr. Creig Hines, Psychiatry -medications: tegretol, seroquel, prozac -remote hx hospitalization twice in the past -retired Engineer, structural  Parkinson's Dz: -saw neurology in the past -did rehab -on [place],  -feels getting worse over time, walks with cane -balance worsening, very cautious, no falls recently  Hypothiroidism -no hx cancer -takes levothyroxine 160mcg  Sacroiliitis/low back pain: -rarely uses tramadol for this on bad days - < 1/2 times per month for bad pain if needs to be active -well aware of risks  Denies history of diabetes.  ROS negative for unless reported above: fevers, unintentional weight loss, hearing or vision loss, chest pain, palpitations, struggling to breath, hemoptysis, melena, hematochezia, hematuria, falls, loc, si, thoughts of self harm  Past Medical History:  Diagnosis Date  . Bipolar 1 disorder ([place])   . COPD (chronic obstructive pulmonary disease) ([place])   . Depression   . First degree AV block   . Hypertension    pt denies but was in medical record  . Hypoglycemia, unspecified   . Myalgia and myositis, unspecified   . Obesity   . Pneumonia 1970  . Skin cancer (melanoma) ([place])   . Thyroid disease   . Unspecified hypothyroidism   . UPJ obstruction, congenital     Past Surgical  History:  Procedure Laterality Date  . APPENDECTOMY  1939  . malignant melanoma removed from right forehead  2008  . PERMANENT PACEMAKER INSERTION N/A 06/19/2013   Procedure: PERMANENT PACEMAKER INSERTION;  Surgeon: Evans Lance, MD;  Location: [facility] CATH LAB;  Service: Cardiovascular;  Laterality: N/A;  . right big toe  surgery  2009  . TRANSURETHRAL RESECTION OF PROSTATE  02/04/2011   Procedure: TRANSURETHRAL RESECTION OF THE PROSTATE (TURP);  Surgeon: Dutch Gray, MD;  Location: [facility] ORS;  Service: Urology;  Laterality: N/A;  . URETHRA SURGERY  2010   reconstruction    Family History  Problem Relation Age of Onset  . Cancer Mother     Social History   Social History  . Marital status: Married    Spouse name: N/A  . Number of children: N/A  . Years of education: N/A   Social History Main Topics  . Smoking status: Former Smoker    Packs/day: 2.00    Years: 40.00    Types: Cigarettes  . Smokeless tobacco: Never Used  . Alcohol use No  . Drug use: No  . Sexual activity: Not Asked   Other Topics Concern  . None   Social History Narrative   Work or School: retire Wellsite geologist Situation: lives with wife      Spiritual Beliefs: episcopalian      Lifestyle: active with yard work; diet ok        Current Outpatient Prescriptions:  .  albuterol (PROVENTIL HFA;VENTOLIN HFA) 108 (90 BASE) MCG/ACT inhaler, Inhale into the lungs. 1-2 puffs every 6 hours if needed for shortness of breath  or wheezing- rescue inhaler, Disp: , Rfl:  .  alclomethasone (ACLOVATE) 0.05 % ointment, Apply 1 application topically 2 (two) times daily as needed (for rash)., Disp: , Rfl:  .  Ascorbic Acid (VITAMIN C) 1000 MG tablet, Take 1,000 mg by mouth 2 (two) times daily. , Disp: , Rfl:  .  aspirin 81 MG tablet, Take 81 mg by mouth daily., Disp: , Rfl:  .  carbamazepine (TEGRETOL) 200 MG tablet, Take 200 mg by mouth 5 (five) times daily. , Disp: , Rfl:  .  carbidopa-levodopa (SINEMET IR)  25-100 MG tablet, Take 1 tablet by mouth 4 (four) times daily., Disp: 360 tablet, Rfl: 0 .  cholecalciferol (VITAMIN D) 1000 UNITS tablet, Take 2,000 Units by mouth daily. , Disp: , Rfl:  .  desoximetasone (TOPICORT) 0.25 % cream, Apply 1 application topically 2 (two) times daily., Disp: , Rfl:  .  Flaxseed, Linseed, (FLAX SEED OIL) 1300 MG CAPS, Take 1 capsule by mouth daily. , Disp: , Rfl:  .  FLUoxetine (PROZAC) 20 MG tablet, Take 20 mg by mouth daily. , Disp: , Rfl:  .  levothyroxine (LEVOTHROID) 125 MCG tablet, Take 1 tablet (125 mcg total) by mouth daily before breakfast., Disp: 90 tablet, Rfl: 3 .  Multiple Vitamin (MULITIVITAMIN WITH MINERALS) TABS, Take 1 tablet by mouth daily., Disp: , Rfl:  .  naproxen sodium (ANAPROX) 220 MG tablet, Take 440 mg by mouth daily as needed (pain). , Disp: , Rfl:  .  Omega-3 Fatty Acids (OMEGA-3 CF PO), Take 1,200 mg by mouth daily. , Disp: , Rfl:  .  pravastatin (PRAVACHOL) 40 MG tablet, Take 1 tablet (40 mg total) by mouth daily., Disp: 90 tablet, Rfl: 3 .  SEROQUEL XR 50 MG TB24 24 hr tablet, Take 50 mg by mouth at bedtime., Disp: , Rfl: 3  EXAM:  Vitals:   10/01/15 1446  BP: 110/60  Pulse: 68  Temp: 97.8 F (36.6 C)    Body mass index is 26.15 kg/m.  GENERAL: vitals reviewed and listed above, alert, oriented, appears well hydrated and in no acute distress  HEENT: atraumatic, conjunttiva clear, no obvious abnormalities on inspection of external nose and ears  NECK: no obvious masses on inspection  LUNGS: clear to auscultation bilaterally, no wheezes, rales or rhonchi, good air movement  CV: HRRR, no peripheral edema  [id number] gait, walks with cane  PSYCH: pleasant and cooperative, no obvious depression or anxiety  ASSESSMENT AND PLAN:  Discussed the following assessment and plan: More than 50% of over 40 minutes spent in total in caring for this patient was spent face-to-face with the patient, counseling and/or coordinating care.     COPD mixed type ([place])  Obstructive sleep apnea  Essential hypertension - Plan: Basic metabolic panel, CBC (no diff)  Sinoatrial node dysfunction (HCC)  Pacemaker  MELANOMA, HX OF  Depressed bipolar I disorder in full remission ([place])  Parkinson disease ([place]) - Plan: Ambulatory referral to Neurology  Hypothyroidism, unspecified hypothyroidism type - Plan: TSH  Hyperlipemia - Plan: Cholesterol, Total, HDL cholesterol  Hyperglycemia - Plan: Hemoglobin A1c -We reviewed the PMH, PSH, FH, SH, Meds and Allergies. -We provided refills for any medications we will prescribe as needed. -We addressed current concerns per orders and patient instructions. -We have asked for records for pertinent exams, studies, vaccines and notes from previous providers. -We have advised patient to follow up per instructions below. -placed referral to Dr. Carles Collet per pt request for worsening parkinson's symptoms; wife sees Dr.  Tat -he thinks has had vaccines -labs today -follow up 3-4 months  -Patient advised to return or notify a doctor immediately if symptoms worsen or persist or new concerns arise.  Patient Instructions  BEFORE YOU LEAVE: -follow up: 3-4 months -labs  -We placed a referral for you as discussed to the neurologist. It usually takes about 1-2 weeks to process and schedule this referral. If you have not heard from [place] regarding this appointment in 2 weeks please contact our office.   We recommend the following healthy lifestyle for LIFE: 1) Small portions.   Tip: eat off of a salad plate instead of a dinner plate.  Tip: It is ok to feel hungry after a meal - that likely means you ate an appropriate portion.  Tip: if you need more or a snack choose fruits, veggies and/or a handful of nuts or seeds.  2) Eat a healthy clean diet.  * Tip: Avoid (less then 1 serving per week): processed foods, sweets, sweetened drinks, white starches (rice, flour, bread, potatoes, pasta, etc), red meat,  fast foods, butter  *Tip: CHOOSE instead   * 5-9 servings per day of fresh or frozen fruits and vegetables (but not corn, potatoes, bananas, canned or dried fruit)   *nuts and seeds, beans   *olives and olive oil   *small portions of lean meats such as fish and white chicken    *small portions of whole grains  3)Get at least 150 minutes of sweaty aerobic exercise per week.  4)Reduce stress - consider counseling, meditation and relaxation to balance other aspects of your life.    Marland Kitchen     [name] R.

## 2015-10-02 LAB — CBC
HCT: 40.8 % (ref 39.0–52.0)
Hemoglobin: 13.9 g/dL (ref 13.0–17.0)
MCHC: 34 g/dL (ref 30.0–36.0)
MCV: 96.2 fl (ref 78.0–100.0)
PLATELETS: 172 10*3/uL (ref 150.0–400.0)
RBC: 4.24 Mil/uL (ref 4.22–5.81)
RDW: 13.9 % (ref 11.5–15.5)
WBC: 4.4 10*3/uL (ref 4.0–10.5)

## 2015-10-02 LAB — BASIC METABOLIC PANEL
BUN: 25 mg/dL — ABNORMAL HIGH (ref 6–23)
CO2: 27 meq/L (ref 19–32)
CREATININE: 1.36 mg/dL (ref 0.40–1.50)
Calcium: 8.8 mg/dL (ref 8.4–10.5)
Chloride: 109 mEq/L (ref 96–112)
GFR: 53.47 mL/min — ABNORMAL LOW (ref >60.00)
GLUCOSE: 82 mg/dL (ref 70–99)
Potassium: 4.3 mEq/L (ref 3.5–5.1)
Sodium: 145 mEq/L (ref 135–145)

## 2015-10-02 LAB — TSH: TSH: 1.74 u[IU]/mL (ref 0.35–4.50)

## 2015-10-02 LAB — HDL CHOLESTEROL: HDL: 73.6 mg/dL (ref >39.00)

## 2015-10-02 LAB — CHOLESTEROL, TOTAL: Cholesterol: 184 mg/dL (ref 0–200)

## 2015-10-02 LAB — HEMOGLOBIN A1C: Hgb A1c MFr Bld: 5.5 % (ref 4.6–6.5)

## 2015-10-09 NOTE — Telephone Encounter (Signed)
Pt was scheduled

## 2015-10-13 ENCOUNTER — Encounter: Payer: Self-pay | Admitting: Internal Medicine

## 2015-10-13 ENCOUNTER — Ambulatory Visit (INDEPENDENT_AMBULATORY_CARE_PROVIDER_SITE_OTHER): Payer: Medicare Other | Admitting: Internal Medicine

## 2015-10-13 DIAGNOSIS — Z950 Presence of cardiac pacemaker: Secondary | ICD-10-CM

## 2015-10-13 NOTE — Patient Instructions (Signed)
Medication Instructions:  Your physician recommends that you continue on your current medications as directed. Please refer to the Current Medication list given to you today.   Labwork: None Ordered   Testing/Procedures: None Ordered   Follow-Up: Your physician wants you to follow-up in: 1 year with Dr. Lovena Le. You will receive a reminder letter in the mail two months in advance. If you don't receive a letter, please call our office to schedule the follow-up appointment.  Remote monitoring is used to monitor your Pacemaker from home. This monitoring reduces the number of office visits required to check your device to one time per year. It allows [place] to keep an eye on the functioning of your device to ensure it is working properly. You are scheduled for a device check from home on 01/12/16. You may send your transmission at any time that day. If you have a wireless device, the transmission will be sent automatically. After your physician reviews your transmission, you will receive a postcard with your next transmission date.   Any Other Special Instructions Will Be Listed Below (If Applicable).     If you need a refill on your cardiac medications before your next appointment, please call your pharmacy.

## 2015-10-13 NOTE — Progress Notes (Signed)
HPI Mr. Alonge returns today for followup. He is a pleasant 82 yo man with symptomatic bradycardia, s/p PPM insertion. In the interim, he has done well. He has stopped mowing the grass but remains active. No other complaints. He denies chest pain or sob.   No Known Allergies   Current Outpatient Prescriptions  Medication Sig Dispense Refill  . albuterol (PROVENTIL HFA;VENTOLIN HFA) 108 (90 BASE) MCG/ACT inhaler Inhale into the lungs. 1-2 puffs every 6 hours if needed for shortness of breath or wheezing- rescue inhaler    . alclomethasone (ACLOVATE) 0.05 % ointment Apply 1 application topically 2 (two) times daily as needed (for rash).    . Ascorbic Acid (VITAMIN C) 1000 MG tablet Take 1,000 mg by mouth 2 (two) times daily.     Marland Kitchen aspirin 81 MG tablet Take 81 mg by mouth daily.    . carbamazepine (TEGRETOL) 200 MG tablet Take 200 mg by mouth 5 (five) times daily.     . carbidopa-levodopa (SINEMET IR) 25-100 MG tablet Take 1 tablet by mouth 4 (four) times daily. 360 tablet 0  . cholecalciferol (VITAMIN D) 1000 UNITS tablet Take 2,000 Units by mouth daily.     Marland Kitchen desoximetasone (TOPICORT) 0.25 % cream Apply 1 application topically 2 (two) times daily.    . Flaxseed, Linseed, (FLAX SEED OIL) 1300 MG CAPS Take 1 capsule by mouth daily.     Marland Kitchen FLUoxetine (PROZAC) 20 MG tablet Take 20 mg by mouth daily.     Marland Kitchen levothyroxine (LEVOTHROID) 125 MCG tablet Take 1 tablet (125 mcg total) by mouth daily before breakfast. 90 tablet 3  . Multiple Vitamin (MULITIVITAMIN WITH MINERALS) TABS Take 1 tablet by mouth daily.    . naproxen sodium (ANAPROX) 220 MG tablet Take 440 mg by mouth daily as needed (pain).     . Omega-3 Fatty Acids (OMEGA-3 CF PO) Take 1,200 mg by mouth daily.     . pravastatin (PRAVACHOL) 40 MG tablet Take 1 tablet (40 mg total) by mouth daily. 90 tablet 3  . SEROQUEL XR 50 MG TB24 24 hr tablet Take 50 mg by mouth at bedtime.  3   No current facility-administered medications for this  visit.      Past Medical History:  Diagnosis Date  . Bipolar 1 disorder ([place])   . COPD (chronic obstructive pulmonary disease) ([place])   . Depression   . First degree AV block   . Hypertension    pt denies but was in medical record  . Hypoglycemia, unspecified   . Myalgia and myositis, unspecified   . Obesity   . Pneumonia 1970  . Skin cancer (melanoma) ([place])   . Thyroid disease   . Unspecified hypothyroidism   . UPJ obstruction, congenital     ROS:   All systems reviewed and negative except as noted in the HPI.   Past Surgical History:  Procedure Laterality Date  . APPENDECTOMY  1939  . malignant melanoma removed from right forehead  2008  . PERMANENT PACEMAKER INSERTION N/A 06/19/2013   Procedure: PERMANENT PACEMAKER INSERTION;  Surgeon: Evans Lance, MD;  Location: [facility] CATH LAB;  Service: Cardiovascular;  Laterality: N/A;  . right big toe  surgery  2009  . TRANSURETHRAL RESECTION OF PROSTATE  02/04/2011   Procedure: TRANSURETHRAL RESECTION OF THE PROSTATE (TURP);  Surgeon: Dutch Gray, MD;  Location: [facility] ORS;  Service: Urology;  Laterality: N/A;  . URETHRA SURGERY  2010   reconstruction  Family History  Problem Relation Age of Onset  . Cancer Mother      Social History   Social History  . Marital status: Married    Spouse name: N/A  . Number of children: N/A  . Years of education: N/A   Occupational History  . Not on file.   Social History Main Topics  . Smoking status: Former Smoker    Packs/day: 2.00    Years: 40.00    Types: Cigarettes  . Smokeless tobacco: Never Used  . Alcohol use No  . Drug use: No  . Sexual activity: Not on file   Other Topics Concern  . 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      Spiritual Beliefs: episcopalian      Lifestyle: active with yard work; diet ok        BP 130/80   Pulse 60   Ht 6' 3.5" (1.918 m)   Wt 210 lb 3.2 oz (95.3  kg)   BMI 25.93 kg/m   Physical Exam:  Well appearing 82 yo man, NAD HEENT: Unremarkable Neck:  6 cm JVD, no thyromegally Back:  No CVA tenderness Lungs:  Clear with no wheezes HEART:  Regular rate rhythm, no murmurs, no rubs, no clicks Abd:  soft, positive bowel sounds, no organomegally, no rebound, no guarding Ext:  2 plus pulses, no edema, no cyanosis, no clubbing Skin:  No rashes no nodules Neuro:  CN II through XII intact, motor grossly intact, mild tremor today   DEVICE  Normal device function.  See PaceArt for details.   Assess/Plan: 1. Sinus node dysfunction - he is asymptomatic. 2. PPM- his DDD [employer] device is working normally.  3. HTN - his BP is mostly well controlled. Will follow. He is encouraged to avoid salty foods.  Mikle Bosworth.D.

## 2015-10-30 NOTE — Progress Notes (Deleted)
Victor Osborne was seen today in the movement disorders clinic for neurologic consultation at the request of Victor Osborne., DO.  The consultation is for the evaluation of Parkinsons disease.  The records that were made available to me were reviewed.  Pt apparently saw Dr. Krista Osborne years ago (2012) for gait change.  I tried to get her records but they were not sent and are not scanned into the system.  Pt is a 82 y.o. male with a hx of bipolar d/o managed by Dr. Creig Osborne, Psychiatry (on tegretol, seroquel, prozac) who reports that he was dx with PD in ***.  Pt is currently on carbidopa/levodopa 25/100 qid  The first symptom(s) the patient noticed was {parkinsons general sx:18033} and this was {NUMBERS;0-15 BY 1:408015} {days/wks/mos/yrs:310907}.    Specific Symptoms:  Tremor: {yes no:314532} Family hx of similar:  {yes no:314532} Voice: *** Sleep: ***  Vivid Dreams:  {yes no:314532}  Acting out dreams:  {yes no:314532} Wet Pillows: {yes no:314532} Postural symptoms:  {yes no:314532}  Falls?  {yes NH:2228965  Per records pt had signficant fall on 03/09/15. The fall occurred from a ladder (Went down 35feet and hit the concrete and hit head conconcrete. Whole right side. ). He landed on concrete.  Bradykinesia symptoms: {parkinson brady:18041} Loss of smell:  {yes no:314532} Loss of taste:  {yes no:314532} Urinary Incontinence:  {yes no:314532} Difficulty Swallowing:  {yes no:314532} Handwriting, micrographia: {yes no:314532} Trouble with ADL's:  {yes no:314532}  Trouble buttoning clothing: {yes no:314532} Depression:  {yes no:314532} Memory changes:  {yes no:314532} Hallucinations:  {yes no:314532}  visual distortions: {yes no:314532} N/V:  {yes no:314532} Lightheaded:  {yes no:314532}  Syncope: {yes no:314532} Diplopia:  {yes no:314532} Dyskinesia:  {yes no:314532}  Neuroimaging has *** previously been performed.   I reviewed his CT of the brain done on 03/25/2014.  There was  atrophy, particularly in the frontal region.  PREVIOUS MEDICATIONS: {Parkinson's RX:18200}  ALLERGIES:  No Known Allergies  CURRENT MEDICATIONS:  Outpatient Encounter Prescriptions as of 11/02/2015  Medication Sig  . albuterol (PROVENTIL HFA;VENTOLIN HFA) 108 (90 BASE) MCG/ACT inhaler Inhale into the lungs. 1-2 puffs every 6 hours if needed for shortness of breath or wheezing- rescue inhaler  . alclomethasone (ACLOVATE) 0.05 % ointment Apply 1 application topically 2 (two) times daily as needed (for rash).  . Ascorbic Acid (VITAMIN C) 1000 MG tablet Take 1,000 mg by mouth 2 (two) times daily.   Marland Kitchen aspirin 81 MG tablet Take 81 mg by mouth daily.  . carbamazepine (TEGRETOL) 200 MG tablet Take 200 mg by mouth 5 (five) times daily.   . carbidopa-levodopa (SINEMET IR) 25-100 MG tablet Take 1 tablet by mouth 4 (four) times daily.  . cholecalciferol (VITAMIN D) 1000 UNITS tablet Take 2,000 Units by mouth daily.   Marland Kitchen desoximetasone (TOPICORT) 0.25 % cream Apply 1 application topically 2 (two) times daily.  . Flaxseed, Linseed, (FLAX SEED OIL) 1300 MG CAPS Take 1 capsule by mouth daily.   Marland Kitchen FLUoxetine (PROZAC) 20 MG tablet Take 20 mg by mouth daily.   Marland Kitchen levothyroxine (LEVOTHROID) 125 MCG tablet Take 1 tablet (125 mcg total) by mouth daily before breakfast.  . Multiple Vitamin (MULITIVITAMIN WITH MINERALS) TABS Take 1 tablet by mouth daily.  . naproxen sodium (ANAPROX) 220 MG tablet Take 440 mg by mouth daily as needed (pain).   . Omega-3 Fatty Acids (OMEGA-3 CF PO) Take 1,200 mg by mouth daily.   . pravastatin (PRAVACHOL) 40 MG tablet Take 1 tablet (40  mg total) by mouth daily.  . SEROQUEL XR 50 MG TB24 24 hr tablet Take 50 mg by mouth at bedtime.   No facility-administered encounter medications on file as of 11/02/2015.     PAST MEDICAL HISTORY:   Past Medical History:  Diagnosis Date  . Bipolar 1 disorder ([place])   . COPD (chronic obstructive pulmonary disease) ([place])   . Depression   . First  degree AV block   . Hypertension    pt denies but was in medical record  . Hypoglycemia, unspecified   . Myalgia and myositis, unspecified   . Obesity   . Pneumonia 1970  . Skin cancer (melanoma) ([place])   . Thyroid disease   . Unspecified hypothyroidism   . UPJ obstruction, congenital     PAST SURGICAL HISTORY:   Past Surgical History:  Procedure Laterality Date  . APPENDECTOMY  1939  . malignant melanoma removed from right forehead  2008  . PERMANENT PACEMAKER INSERTION N/A 06/19/2013   Procedure: PERMANENT PACEMAKER INSERTION;  Surgeon: Evans Lance, MD;  Location[facility] CATH LAB;  Service: Cardiovascular;  Laterality: N/A;  . right big toe  surgery  2009  . TRANSURETHRAL RESECTION OF PROSTATE  02/04/2011   Procedure: TRANSURETHRAL RESECTION OF THE PROSTATE (TURP);  Surgeon: Dutch Gray, MD;  Location: [facility] ORS;  Service: Urology;  Laterality: N/A;  . URETHRA SURGERY  2010   reconstruction    SOCIAL HISTORY:   Social History   Social History  . Marital status: Married    Spouse name: N/A  . Number of children: N/A  . Years of education: N/A   Occupational History  . Not on file.   Social History Main Topics  . Smoking status: Former Smoker    Packs/day: 2.00    Years: 40.00    Types: Cigarettes  . Smokeless tobacco: Never Used  . Alcohol use No  . Drug use: No  . Sexual activity: Not on file   Other Topics Concern  . 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 Mother Deceased  . Father Deceased  . Maternal Grandmother Deceased  . Maternal Grandfather Deceased  . Paternal Grandmother Deceased  . Paternal Grandfather Deceased    ROS:  A complete 10 system review of systems was obtained and was unremarkable apart from what is mentioned  above.  PHYSICAL EXAMINATION:    VITALS:  There were no vitals filed for this visit.  GEN:  The patient appears stated age and is in NAD. HEENT:  Normocephalic, atraumatic.  The mucous membranes are moist. The superficial temporal arteries are without ropiness or tenderness. CV:  RRR Lungs:  CTAB Neck/HEME:  There are no carotid bruits bilaterally.  Neurological examination:  Orientation: The patient is alert and oriented x3. Fund of knowledge is appropriate.  Recent and remote memory are intact.  Attention and concentration are normal.    Able to name objects and repeat phrases. Cranial nerves: There is good facial symmetry. Pupils are equal round and reactive to light bilaterally. Fundoscopic exam reveals clear margins bilaterally. Extraocular muscles are intact. The visual fields are full to confrontational testing. The speech is fluent and clear. Soft palate  rises symmetrically and there is no tongue deviation. Hearing is intact to conversational tone. Sensation: Sensation is intact to light and pinprick throughout (facial, trunk, extremities). Vibration is intact at the bilateral big toe. There is no extinction with double simultaneous stimulation. There is no sensory dermatomal level identified. Motor: Strength is 5/5 in the bilateral upper and lower extremities.   Shoulder shrug is equal and symmetric.  There is no pronator drift. Deep tendon reflexes: Deep tendon reflexes are 2/4 at the bilateral biceps, triceps, brachioradialis, patella and achilles. Plantar responses are downgoing bilaterally.  Movement examination: Tone: There is ***tone in the bilateral upper extremities.  The tone in the lower extremities is ***.  Abnormal movements: *** Coordination:  There is *** decremation with RAM's, *** Gait and Station: The patient has *** difficulty arising out of a deep-seated chair without the use of the hands. The patient's stride length is ***.  The patient has a *** pull test.       ASSESSMENT/PLAN:  ***  Cc:  [name] R., DO

## 2015-11-02 ENCOUNTER — Ambulatory Visit: Payer: Medicare Other | Admitting: Neurology

## 2015-11-05 NOTE — Progress Notes (Signed)
Victor Osborne was seen today in the movement disorders clinic for neurologic consultation at the request of [name]., DO.  The consultation is for the evaluation of Parkinsons disease.  The records that were made available to me were reviewed.  Pt apparently saw Dr. Krista Blue years ago (2012) for gait change.  I tried to get her records but they were not sent and are not scanned into the system.  Pt is a 82 y.o. male with a hx of bipolar d/o managed by Dr. Creig Hines, Psychiatry (on tegretol, seroquel, prozac).  States that he was on lithium and stellazine before this.  Lithium "ruined" my thyroid and it was changed to his current regimen years ago.   He states that his primary care doctor is the one who put him on levodopa but pt didn't know he was dx with PD until he looked at records.   Reports that his PCP prescribed levodopa many years ago ("3 or 4 or more, but I don't know")  Pt is currently on carbidopa/levodopa 25/100 tid (AM, noon, bedtime).    Specific Symptoms:  Tremor: Yes.   (started in R hand but L hand as well) Family hx of similar:  no Voice: raspy and quiet Sleep: sleeping well  Vivid Dreams:  Yes.  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   (as above) Wet Pillows: No.  Postural symptoms:  Yes.     Falls?  Yes.    Per records pt had signficant fall on 03/09/15. The fall occurred from a ladder (Went down 3feet and hit the concrete and hit head conconcrete. Whole right side. ). He landed on concrete. States that doesn't climb on ladders any longer as he had several falls before that out of "rafters." Bradykinesia symptoms: difficulty getting out of a chair and difficulty regaining balance Loss of smell:  Yes.   Loss of taste:  No. (but likes spicy food) Urinary Incontinence:  Yes.   (had bladder surgery; no need to wear diapers and has to go to bathroom frequently to avoid accidents) Difficulty  Swallowing:  No. Handwriting, micrographia: unknown Trouble with ADL's:  No.  Trouble buttoning clothing: No. Depression:  Stable with medication Memory changes:  Yes.   Hallucinations:  No.  visual distortions: Yes.   (occasional side "flutter") N/V:  No. Lightheaded:  Yes.   but very rarely  Syncope: No. Diplopia:  No. Dyskinesia:  No.  Neuroimaging has previously been performed.   I reviewed his CT of the brain done on 03/25/2014.  There was atrophy, particularly in the frontal region.  ALLERGIES:  No Known Allergies  CURRENT MEDICATIONS:  Outpatient Encounter Prescriptions as of 11/06/2015  Medication Sig  . albuterol (PROVENTIL HFA;VENTOLIN HFA) 108 (90 BASE) MCG/ACT inhaler Inhale into the lungs. 1-2 puffs every 6 hours if needed for shortness of breath or wheezing- rescue inhaler  . alclomethasone (ACLOVATE) 0.05 % ointment Apply 1 application topically 2 (two) times daily as needed (for rash).  . Ascorbic Acid (VITAMIN C) 1000 MG tablet Take 1,000 mg by mouth 2 (two) times daily.   Marland Kitchen aspirin 81 MG tablet Take 81 mg by mouth daily.  . carbamazepine (TEGRETOL) 200 MG tablet Take 200 mg by mouth 5 (five) times daily.   . carbidopa-levodopa (SINEMET IR) 25-100 MG tablet Take 1 tablet by mouth 4 (four) times daily.  . cholecalciferol (VITAMIN D) 1000 UNITS  tablet Take 2,000 Units by mouth daily.   Marland Kitchen desoximetasone (TOPICORT) 0.25 % cream Apply 1 application topically 2 (two) times daily.  . Flaxseed, Linseed, (FLAX SEED OIL) 1300 MG CAPS Take 1 capsule by mouth daily.   Marland Kitchen FLUoxetine (PROZAC) 20 MG tablet Take 20 mg by mouth daily.   Marland Kitchen levothyroxine (LEVOTHROID) 125 MCG tablet Take 1 tablet (125 mcg total) by mouth daily before breakfast.  . Multiple Vitamin (MULITIVITAMIN WITH MINERALS) TABS Take 1 tablet by mouth daily.  . naproxen sodium (ANAPROX) 220 MG tablet Take 440 mg by mouth daily as needed (pain).   . Omega-3 Fatty Acids (OMEGA-3 CF PO) Take 1,200 mg by mouth daily.     . pravastatin (PRAVACHOL) 40 MG tablet Take 1 tablet (40 mg total) by mouth daily.  . SEROQUEL XR 50 MG TB24 24 hr tablet Take 50 mg by mouth at bedtime.   No facility-administered encounter medications on file as of 11/06/2015.     PAST MEDICAL HISTORY:   Past Medical History:  Diagnosis Date  . Bipolar 1 disorder ([place])   . COPD (chronic obstructive pulmonary disease) ([place])   . Depression   . First degree AV block   . Hypertension    pt denies but was in medical record  . Hypoglycemia, unspecified   . Myalgia and myositis, unspecified   . Obesity   . Pneumonia 1970  . Skin cancer (melanoma) ([place])   . Thyroid disease   . Unspecified hypothyroidism   . UPJ obstruction, congenital     PAST SURGICAL HISTORY:   Past Surgical History:  Procedure Laterality Date  . APPENDECTOMY  1939  . malignant melanoma removed from right forehead  2008  . PERMANENT PACEMAKER INSERTION N/A 06/19/2013   Procedure: PERMANENT PACEMAKER INSERTION;  Surgeon: Evans Lance, MD;  Location: [facility] CATH LAB;  Service: Cardiovascular;  Laterality: N/A;  . right big toe  surgery  2009  . TRANSURETHRAL RESECTION OF PROSTATE  02/04/2011   Procedure: TRANSURETHRAL RESECTION OF THE PROSTATE (TURP);  Surgeon: Dutch Gray, MD;  Location: [facility] ORS;  Service: Urology;  Laterality: N/A;  . URETHRA SURGERY  2010   reconstruction    SOCIAL HISTORY:   Social History   Social History  . Marital status: Married    Spouse name: N/A  . Number of children: N/A  . Years of education: N/A   Occupational History  . retired    Social History Main Topics  . Smoking status: Former Smoker    Packs/day: 2.00    Years: 40.00    Types: Cigarettes  . Smokeless tobacco: Never Used  . Alcohol use No  . Drug use: No  . Sexual activity: Not on file   Other Topics Concern  . 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 Mother Deceased  . Father Deceased  . Maternal Grandmother Deceased  . Maternal Grandfather Deceased  . Paternal Grandmother Deceased  . Paternal Grandfather Deceased  . Sister Alive  . Brother Alive  . Daughter Alive  . Daughter Deceased    ROS:  A complete 10 system review of systems was obtained and was unremarkable apart from what  is mentioned above.  PHYSICAL EXAMINATION:    VITALS:   Vitals:   11/06/15 1043  BP: 120/70  Pulse: 60  Weight: 216 lb (98 kg)  Height: 6' 3.75" (1.924 m)    GEN:  The patient appears stated age and is in NAD. HEENT:  Normocephalic, atraumatic.  The mucous membranes are moist. The superficial temporal arteries are without ropiness or tenderness. CV:  RRR Lungs:  CTAB Neck/HEME:  There are no carotid bruits bilaterally.  Neurological examination:  Orientation: The patient is alert and oriented x3. Fund of knowledge is appropriate.  Recent and remote memory are intact.  Attention and concentration are normal.    Able to name objects and repeat phrases. Cranial nerves: There is good facial symmetry. Pupils are equal round and reactive to light bilaterally. Fundoscopic exam reveals clear margins bilaterally. Extraocular muscles are intact. The visual fields are full to confrontational testing. The speech is fluent and clear. Soft palate rises symmetrically and there is no tongue deviation. Hearing is intact to conversational tone. Sensation: Sensation is intact to light and pinprick throughout (facial, trunk, extremities). Vibration is decreased at the bilateral big toe. There is no extinction with double simultaneous stimulation. There is no sensory dermatomal level identified. Motor: Strength is 5/5 in the bilateral upper and lower extremities.   Shoulder shrug is equal and symmetric.  There is no pronator drift. Deep tendon  reflexes: Deep tendon reflexes are 3/4 at the bilateral biceps, triceps, brachioradialis, patella and achilles.  He has bilateral pectoralis reflexes.  Plantar responses are downgoing bilaterally.  Movement examination: Tone: There is normal tone in the bilateral upper extremities.  The tone in the lower extremities is normal.  Abnormal movements: There is L more than RUE resting UE resting tremor that is moderate amplitude Coordination:  There is no decremation with RAM's, with any form of RAMS, including alternating supination and pronation of the forearm, hand opening and closing, finger taps, heel taps and toe taps. Gait and Station: The patient has mild difficulty arising out of a deep-seated chair without the use of the hands. The patient's stride length is normal, but he is slightly unbalanced.  ASSESSMENT/PLAN:  1.  Parkinsonism.  -It is likely he has Parkinson's disease, but he is currently on levodopa which may be masking symptoms.  In addition, he was exposed to Stelazine for years and is currently on quetiapine.  He is on low-dose quetiapine and quetiapine has low D2 receptor Affinity, thereby producing much less risk for parkinsonism than other antipsychotic medications.  Because of confusion of secondary parkinsonism versus primary Parkinson's disease, we decided to go ahead and proceed with a DaT scan.  -Discussed that we used to believe that levodopa would increase risk of melanoma but now believe that PD increases risk of melanoma.  He has had two of them and gets skin checks q 6 months.  -continue carbidopa/levodopa 25/100 tid but move to 30 min before meals.  -safety discussed  -community exercise programs discussed  2.  Hyperreflexia  -he has no neck pain.  He cannot have an MRI due to PPM.  He did have a CT neck in 2014 and it demonstrated moderate to severe multilevel degenerative changes in the neck.    3.  F/u after above completed.  Much greater than 50% of this visit was  spent in counseling and coordinating care.  Total face to face time:  60 min   Cc:  [name] R., DO

## 2015-11-06 ENCOUNTER — Encounter: Payer: Self-pay | Admitting: Neurology

## 2015-11-06 ENCOUNTER — Ambulatory Visit (INDEPENDENT_AMBULATORY_CARE_PROVIDER_SITE_OTHER): Payer: Medicare Other | Admitting: Neurology

## 2015-11-06 VITALS — BP 120/70 | HR 60 | Ht 75.75 in | Wt 216.0 lb

## 2015-11-06 DIAGNOSIS — G20C Parkinsonism, unspecified: Secondary | ICD-10-CM

## 2015-11-06 DIAGNOSIS — F3162 Bipolar disorder, current episode mixed, moderate: Secondary | ICD-10-CM

## 2015-11-06 DIAGNOSIS — G20 Parkinson's disease: Secondary | ICD-10-CM | POA: Diagnosis not present

## 2015-11-06 DIAGNOSIS — R251 Tremor, unspecified: Secondary | ICD-10-CM

## 2015-11-06 NOTE — Addendum Note (Signed)
Addended byAnnamaria Helling on: 11/06/2015 12:41 PM   Modules accepted: Orders

## 2015-11-18 ENCOUNTER — Encounter: Payer: Self-pay | Admitting: Neurology

## 2015-11-26 ENCOUNTER — Ambulatory Visit: Payer: Medicare Other | Admitting: Neurology

## 2015-11-30 ENCOUNTER — Telehealth: Payer: Self-pay | Admitting: Neurology

## 2015-11-30 NOTE — Telephone Encounter (Signed)
Needs appt at his convenience to go over DaT scan.  Not urgent at all and can wait until he is able as it is not going to change anything I do.  He is already on levodopa, so if he wants to wait until after holidays that is okay.  Whatever is most convenient for pt/wife.

## 2015-11-30 NOTE — Telephone Encounter (Signed)
Appt made with patient.  

## 2016-01-05 NOTE — Progress Notes (Signed)
Victor Osborne was seen today in the movement disorders clinic for neurologic consultation at the request of Victor Osborne., DO.  The consultation is for the evaluation of Parkinsons disease.  The records that were made available to me were reviewed.  Pt apparently saw Dr. Krista Osborne years ago (2012) for gait change.  I tried to get her records but they were not sent and are not scanned into the system.  Pt is a 82 y.o. male with a hx of bipolar d/o managed by Dr. Creig Osborne, Psychiatry (on tegretol, seroquel, prozac).  States that he was on lithium and stellazine before this.  Lithium "ruined" my thyroid and it was changed to his current regimen years ago.   He states that his primary care doctor is the one who put him on levodopa but pt didn't know he was dx with PD until he looked at records.   Reports that his PCP prescribed levodopa many years ago ("3 or 4 or more, but I don't know")  Pt is currently on carbidopa/levodopa 25/100 tid (AM, noon, bedtime).    01/07/16 update:  The patient returns for follow-up, accompanied by his wife who supplements the history.  He had a DaT scan on 11/25/2015.  This demonstrated abnormal image grade 3: absent putamen activity in both hemispheres with asymmetrically reduced caudate activity.  He remains on carbidopa/levodopa 25/100, one tablet 3 times per day.  He tried to move them closer together but he has trouble remembering it so often takes the last one before bedtime.  No formal exercise, but he takes care of the home and his wife and the dogs.   He continues to have tremor, but no falls.  No lightheadedness or near syncope.  He does have bipolar disorder and remains on Tegretol, Prozac and Seroquel XR, 50 mg daily.  He has not had any hallucinations.  Neuroimaging has previously been performed.   I reviewed his CT of the brain done on 03/25/2014.  There was atrophy, particularly in the frontal region.  ALLERGIES:  No Known Allergies  CURRENT MEDICATIONS:    Outpatient Encounter Prescriptions as of 01/07/2016  Medication Sig  . albuterol (PROVENTIL HFA;VENTOLIN HFA) 108 (90 BASE) MCG/ACT inhaler Inhale into the lungs. 1-2 puffs every 6 hours if needed for shortness of breath or wheezing- rescue inhaler  . alclomethasone (ACLOVATE) 0.05 % ointment Apply 1 application topically 2 (two) times daily as needed (for rash).  . Ascorbic Acid (VITAMIN C) 1000 MG tablet Take 1,000 mg by mouth 2 (two) times daily.   Marland Kitchen aspirin 81 MG tablet Take 81 mg by mouth daily.  . carbamazepine (TEGRETOL) 200 MG tablet Take 200 mg by mouth 5 (five) times daily.   . carbidopa-levodopa (SINEMET IR) 25-100 MG tablet Take 1 tablet by mouth 4 (four) times daily.  . cholecalciferol (VITAMIN D) 1000 UNITS tablet Take 2,000 Units by mouth daily.   Marland Kitchen desoximetasone (TOPICORT) 0.25 % cream Apply 1 application topically 2 (two) times daily.  . Flaxseed, Linseed, (FLAX SEED OIL) 1300 MG CAPS Take 1 capsule by mouth daily.   Marland Kitchen FLUoxetine (PROZAC) 20 MG tablet Take 20 mg by mouth daily.   Marland Kitchen levothyroxine (LEVOTHROID) 125 MCG tablet Take 1 tablet (125 mcg total) by mouth daily before breakfast.  . Multiple Vitamin (MULITIVITAMIN WITH MINERALS) TABS Take 1 tablet by mouth daily.  . naproxen sodium (ANAPROX) 220 MG tablet Take 440 mg by mouth daily as needed (pain).   . Omega-3 Fatty Acids (  OMEGA-3 CF PO) Take 1,200 mg by mouth daily.   . pravastatin (PRAVACHOL) 40 MG tablet Take 1 tablet (40 mg total) by mouth daily.  . SEROQUEL XR 50 MG TB24 24 hr tablet Take 50 mg by mouth at bedtime.   No facility-administered encounter medications on file as of 01/07/2016.     PAST MEDICAL HISTORY:   Past Medical History:  Diagnosis Date  . Bipolar 1 disorder ([place])   . COPD (chronic obstructive pulmonary disease) ([place])   . Depression   . First degree AV block   . Hypertension    pt denies but was in medical record  . Hypoglycemia, unspecified   . Myalgia and myositis, unspecified   .  Obesity   . Pneumonia 1970  . Skin cancer (melanoma) ([place])   . Thyroid disease   . Unspecified hypothyroidism   . UPJ obstruction, congenital     PAST SURGICAL HISTORY:   Past Surgical History:  Procedure Laterality Date  . APPENDECTOMY  1939  . malignant melanoma removed from right forehead  2008  . PERMANENT PACEMAKER INSERTION N/A 06/19/2013   Procedure: PERMANENT PACEMAKER INSERTION;  Surgeon: Victor Lance, MD;  Location: [facility] CATH LAB;  Service: Cardiovascular;  Laterality: N/A;  . right big toe  surgery  2009  . TRANSURETHRAL RESECTION OF PROSTATE  02/04/2011   Procedure: TRANSURETHRAL RESECTION OF THE PROSTATE (TURP);  Surgeon: Victor Gray, MD;  Location: [facility] ORS;  Service: Urology;  Laterality: N/A;  . URETHRA SURGERY  2010   reconstruction    SOCIAL HISTORY:   Social History   Social History  . Marital status: Married    Spouse name: N/A  . Number of children: N/A  . Years of education: N/A   Occupational History  . retired    Social History Main Topics  . Smoking status: Former Smoker    Packs/day: 2.00    Years: 40.00    Types: Cigarettes  . Smokeless tobacco: Never Used  . Alcohol use No  . Drug use: No  . Sexual activity: Not on file   Other Topics Concern  . 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 Mother Deceased  . Father Deceased  . Maternal Grandmother Deceased  . Maternal Grandfather Deceased  . Paternal Grandmother Deceased  . Paternal Grandfather Deceased  . Sister Alive  . Brother Alive  . Daughter Alive  . Daughter Deceased    ROS:  A complete 10 system review of systems was obtained and was unremarkable apart from what is mentioned above.  PHYSICAL EXAMINATION:    VITALS:   Vitals:   01/07/16 1246  BP:  136/82  Pulse: 66  Weight: 213 lb (96.6 kg)  Height: 6\' 4"  (1.93 m)    GEN:  The patient appears stated age and is in NAD. HEENT:  Normocephalic, atraumatic.  The mucous membranes are moist. The superficial temporal arteries are without ropiness or tenderness. CV:  RRR Lungs:  CTAB Neck/HEME:  There are no carotid bruits bilaterally.  Neurological examination:  Orientation: The patient is alert and oriented x3. Fund of knowledge is appropriate.  Recent and remote memory are intact.  Attention and concentration  are normal.    Able to name objects and repeat phrases. Cranial nerves: There is good facial symmetry. Pupils are equal round and reactive to light bilaterally. Fundoscopic exam reveals clear margins bilaterally. Extraocular muscles are intact. The visual fields are full to confrontational testing. The speech is fluent and clear. Soft palate rises symmetrically and there is no tongue deviation. Hearing is intact to conversational tone. Sensation: Sensation is intact to light and pinprick throughout (facial, trunk, extremities). Vibration is decreased at the bilateral big toe. There is no extinction with double simultaneous stimulation. There is no sensory dermatomal level identified. Motor: Strength is 5/5 in the bilateral upper and lower extremities.   Shoulder shrug is equal and symmetric.  There is no pronator drift. Deep tendon reflexes: Deep tendon reflexes are 3/4 at the bilateral biceps, triceps, brachioradialis, patella and achilles.  He has bilateral pectoralis reflexes.  Plantar responses are downgoing bilaterally.  Movement examination: Tone: There is minimal increased tone in the LUE  The tone in the lower extremities is normal.  Abnormal movements: There is L more than RUE resting UE resting tremor that is moderate amplitude Coordination:  There is no decremation with RAM's, with any form of RAMS, including alternating supination and pronation of the forearm, hand opening  and closing, finger taps, heel taps and toe taps. Gait and Station: The patient has no difficulty arising out of a deep-seated chair without the use of the hands. The patient's stride length is normal, and he has decreased arm swing in the L arm compared to the right.    ASSESSMENT/PLAN:  1.  Parkinsonism.  -Had a DaT scan that was abnormal on 11/25/15  -continue carbidopa/levodopa 25/100 tid but move to 30 min before meals.   -Discussed that we used to believe that levodopa would increase risk of melanoma but now believe that PD increases risk of melanoma.  He has had two of them and gets skin checks q 6 months.  -safety discussed  -community exercise programs discussed  -send referral to neurorehab unit  2.  Hyperreflexia  -he has no neck pain.  He cannot have an MRI due to PPM.  He did have a CT neck in 2014 and it demonstrated moderate to severe multilevel degenerative changes in the neck.    3.  F/u after above completed.  Much greater than 50% of this visit was spent in counseling and coordinating care.  Total face to face time:  25 min   Cc:  [name] R., DO

## 2016-01-07 ENCOUNTER — Encounter: Payer: Self-pay | Admitting: Neurology

## 2016-01-07 ENCOUNTER — Ambulatory Visit (INDEPENDENT_AMBULATORY_CARE_PROVIDER_SITE_OTHER): Payer: Medicare Other | Admitting: Neurology

## 2016-01-07 VITALS — BP 136/82 | HR 66 | Ht 76.0 in | Wt 213.0 lb

## 2016-01-07 DIAGNOSIS — G20 Parkinson's disease: Secondary | ICD-10-CM

## 2016-01-07 DIAGNOSIS — F3162 Bipolar disorder, current episode mixed, moderate: Secondary | ICD-10-CM | POA: Diagnosis not present

## 2016-01-07 NOTE — Addendum Note (Signed)
Addended byAnnamaria Helling on: 01/07/2016 01:28 PM   Modules accepted: Orders

## 2016-01-12 ENCOUNTER — Ambulatory Visit (INDEPENDENT_AMBULATORY_CARE_PROVIDER_SITE_OTHER): Payer: Medicare Other | Admitting: *Deleted

## 2016-01-12 DIAGNOSIS — I495 Sick sinus syndrome: Secondary | ICD-10-CM | POA: Diagnosis not present

## 2016-01-12 NOTE — Progress Notes (Signed)
Remote pacemaker transmission.   

## 2016-01-13 LAB — CUP PACEART REMOTE DEVICE CHECK
Battery Remaining Percentage: 100 %
Brady Statistic RA Percent Paced: 81 %
Brady Statistic RV Percent Paced: 100 %
Implantable Lead Implant Date: 20150603
Implantable Lead Implant Date: 20150603
Implantable Lead Location: 753860
Implantable Lead Model: 4136
Implantable Lead Model: 4137
Implantable Lead Serial Number: 29617326
Lead Channel Impedance Value: 659 Ohm
Lead Channel Impedance Value: 734 Ohm
Lead Channel Pacing Threshold Amplitude: 1.2 V
Lead Channel Pacing Threshold Pulse Width: 0.5 ms
Lead Channel Setting Pacing Amplitude: 1.6 V
Lead Channel Setting Pacing Amplitude: 2 V
MDC IDC LEAD LOCATION: 753859
MDC IDC LEAD SERIAL: 29477746
MDC IDC MSMT BATTERY REMAINING LONGEVITY: 120 mo
MDC IDC MSMT LEADCHNL RA PACING THRESHOLD AMPLITUDE: 0.7 V
MDC IDC MSMT LEADCHNL RV PACING THRESHOLD PULSEWIDTH: 0.4 ms
MDC IDC PG IMPLANT DT: 20150603
MDC IDC SESS DTM: 20171226053500
MDC IDC SET LEADCHNL RV PACING PULSEWIDTH: 0.4 ms
MDC IDC SET LEADCHNL RV SENSING SENSITIVITY: 2.5 mV
Pulse Gen Serial Number: 389736

## 2016-01-15 ENCOUNTER — Encounter: Payer: Self-pay | Admitting: Cardiology

## 2016-01-25 ENCOUNTER — Ambulatory Visit: Payer: Medicare Other | Admitting: Physical Therapy

## 2016-01-27 ENCOUNTER — Ambulatory Visit: Payer: Medicare Other | Attending: Neurology | Admitting: Physical Therapy

## 2016-01-27 DIAGNOSIS — R2681 Unsteadiness on feet: Secondary | ICD-10-CM | POA: Diagnosis present

## 2016-01-27 DIAGNOSIS — R293 Abnormal posture: Secondary | ICD-10-CM | POA: Insufficient documentation

## 2016-01-27 DIAGNOSIS — R2689 Other abnormalities of gait and mobility: Secondary | ICD-10-CM | POA: Diagnosis present

## 2016-01-27 NOTE — Therapy (Signed)
[facility] [address] [place], [place], [address] Phone: [phone]   Fax:  [phone]  Physical Therapy Evaluation  Patient Details  Name: Victor Osborne MRN: [id number] Date of Birth: 1933-01-10 Referring Provider: Alonza Bogus, DO  Encounter Date: 01/27/2016      PT End of Session - 01/27/16 2054    Visit Number 1   Number of Visits 9   Date for PT Re-Evaluation 03/28/16   Authorization Type Medicare/[employer]   PT Start Time [phone]   PT Stop Time 0931   PT Time Calculation (min) 42 min   Activity Tolerance Patient tolerated treatment well   Behavior During Therapy [facility] for tasks assessed/performed      Past Medical History:  Diagnosis Date  . Bipolar 1 disorder ([place])   . COPD (chronic obstructive pulmonary disease) ([place])   . Depression   . First degree AV block   . Hypertension    pt denies but was in medical record  . Hypoglycemia, unspecified   . Myalgia and myositis, unspecified   . Obesity   . Pneumonia 1970  . Skin cancer (melanoma) ([place])   . Thyroid disease   . Unspecified hypothyroidism   . UPJ obstruction, congenital     Past Surgical History:  Procedure Laterality Date  . APPENDECTOMY  1939  . malignant melanoma removed from right forehead  2008  . PERMANENT PACEMAKER INSERTION N/A 06/19/2013   Procedure: PERMANENT PACEMAKER INSERTION;  Surgeon: Evans Lance, MD;  Location: [facility] CATH LAB;  Service: Cardiovascular;  Laterality: N/A;  . right big toe  surgery  2009  . TRANSURETHRAL RESECTION OF PROSTATE  02/04/2011   Procedure: TRANSURETHRAL RESECTION OF THE PROSTATE (TURP);  Surgeon: Dutch Gray, MD;  Location: [facility] ORS;  Service: Urology;  Laterality: N/A;  . URETHRA SURGERY  2010   reconstruction    There were no vitals filed for this visit.       Subjective Assessment - 01/27/16 0852    Subjective I'm bipolar in addition to having Parkinson's.  I've been on Sinemet for  years.  My balance is horrible.  My balance has been worsening over the past 3-5 years.  [place] digging a trench in the backyard.  Able to get back up on my own.   Patient is accompained by: Family member  wife   Pertinent History Parkinson's x at least 3-5 years; bipolar disorder, COPD, pacemaker   Patient Stated Goals Pt's goals for physical therapy "to improve balance, but it may not get better."   Currently in Pain? No/denies            [facility] PT Assessment - 01/27/16 0857      Assessment   Medical Diagnosis Parkinson's disease   Referring Provider Wells Guiles Tat, DO   Onset Date/Surgical Date --  gradually over the past 3-5 years     Precautions   Precautions Fall     Balance Screen   Has the patient fallen in the past 6 months Yes   How many times? 1   Has the patient had a decrease in activity level because of a fear of falling?  No   Is the patient reluctant to leave their home because of a fear of falling?  No     Home Environment   Living Environment Private residence   Living Arrangements Spouse/significant other  has gardener and housekeeper   Available Help at Discharge Other ([place])  Pt is caregiver for  wife, who also has Parkinsonism   Type of [place] to enter   Entrance Stairs-Number of Steps 2   Entrance Stairs-Rails --  Handrail on top of step up into home   Home Layout Two level   [place] - single point;Grab bars - tub/shower   Additional Comments Pt reports balance is more difficult when standing still or just starting walking     Prior Function   Level of Independence Independent with basic ADLs;Independent with community mobility with device;Independent with household mobility without device   Leisure Enjoys riding around property at home, climbs ladders and does work around house; takes care of his wife.  Exercise consists of walking in [employer], nearly everyday.  Goes out to eat for every meal.      Observation/Other Assessments   Focus on Therapeutic Outcomes (FOTO)  NA     Posture/Postural Control   Posture/Postural Control Postural limitations   Postural Limitations Forward head;Rounded Shoulders     Tone   Assessment Location --  Slightly increased tone with P/ROM in RLE     ROM / Strength   AROM / PROM / Strength Strength     Strength   Overall Strength Within functional limits for tasks performed     Transfers   Transfers Sit to Stand;Stand to Sit   Sit to Stand 6: Modified independent (Device/Increase time);Without upper extremity assist;From chair/3-in-1   Five time sit to stand comments  14.68   Stand to Sit 6: Modified independent (Device/Increase time);Without upper extremity assist;To chair/3-in-1     Ambulation/Gait   Ambulation/Gait Yes   Ambulation/Gait Assistance 6: Modified independent (Device/Increase time)   Ambulation Distance (Feet) 200 Feet   Assistive device Straight cane;None   Gait Pattern Step-through pattern;Decreased arm swing - left;Decreased arm swing - right;Decreased trunk rotation;Wide base of support   Ambulation Surface Level;Indoor   Gait velocity 11.01 sec= 2.97 ft/sec     Standardized Balance Assessment   Standardized Balance Assessment Timed Up and Go Test     Timed Up and Go Test   Normal TUG (seconds) 12.58   Manual TUG (seconds) 13.9   Cognitive TUG (seconds) 12.55   TUG Comments Scores >10% difference indicate increased fall risk     High Level Balance   High Level Balance Comments MiniBESTest score:  24/28 (difficulty with SLS and with EC standing on foam surface)     Functional Gait  Assessment   Gait assessed  Yes   Gait Level Surface Walks 20 ft in less than 7 sec but greater than 5.5 sec, uses assistive device, slower speed, mild gait deviations, or deviates 6-10 in outside of the 12 in walkway width.  5.57   Change in Gait Speed Able to smoothly change walking speed without loss of balance or gait deviation. Deviate  no more than 6 in outside of the 12 in walkway width.   Gait with Horizontal Head Turns Performs head turns smoothly with slight change in gait velocity (eg, minor disruption to smooth gait path), deviates 6-10 in outside 12 in walkway width, or uses an assistive device.   Gait with Vertical Head Turns Performs task with slight change in gait velocity (eg, minor disruption to smooth gait path), deviates 6 - 10 in outside 12 in walkway width or uses assistive device   Gait and Pivot Turn Pivot turns safely within 3 sec and stops quickly with no loss of balance.   Step Over Obstacle  Is able to step over one shoe box (4.5 in total height) without changing gait speed. No evidence of imbalance.   Gait with Narrow Base of Support Ambulates 4-7 steps.   Gait with Eyes Closed Walks 20 ft, uses assistive device, slower speed, mild gait deviations, deviates 6-10 in outside 12 in walkway width. Ambulates 20 ft in less than 9 sec but greater than 7 sec.  8.2 sec   Ambulating Backwards Walks 20 ft, uses assistive device, slower speed, mild gait deviations, deviates 6-10 in outside 12 in walkway width.  17.46   Steps Alternating feet, must use rail.   Total Score 21   FGA comment: Scores <22/30 indicate increased fall risk.l                             PT Short Term Goals - 01/27/16 2059      PT SHORT TERM GOAL #1   Title Same as LTGs           PT Long Term Goals - 01/27/16 2059      PT LONG TERM GOAL #1   Title Pt will be independent with HEP to address balance, gait, transfers.  TARGET 02/27/16   Time 4   Period Weeks   Status New     PT LONG TERM GOAL #2   Title Pt will improve 5x sit<>stand to less than or equal to 13 seconds for improved efficiency and safety with transfers.   Time 4   Period Weeks   Status New     PT LONG TERM GOAL #3   Title Pt will improve Functional Gait Assessment to at least 23/30 for decreased fall risk.   Time 4   Period Weeks   Status  New     PT LONG TERM GOAL #4   Title Pt will ambulate outdoor, unlevel surfaces, at least 1000 ft, independently, no LOB, for improved community gait.   Time 4   Period Weeks   Status New     PT LONG TERM GOAL #5   Title Pt will verbalize understanding of local Parkinson's disease resources.   Time 4   Period Weeks   Status New     Additional Long Term Goals   Additional Long Term Goals Yes     PT LONG TERM GOAL #6   Title Pt will verbalize understanding of fall prevention in the home environment.   Time 4   Period Weeks   Status New               Plan - 01/27/16 2054    Clinical Impression Statement Pt is an 83 year old male with history of Parkinson's disease, who presents to OP PT today with gradually worsening balance over the past several years, with one fall in the past six months.  Pt presents with decreased timing and coordination of gait, decreased balance, abnormal posture, bradykinesia noted with transfers.  Pt has slowed outdoor yard activities due to balance issues.  Pt will benefit from skilled physical therapy to address the above stated deficits for decreased fall risk and improved functional mobility.   Rehab Potential Good   PT Frequency 2x / week   PT Duration 4 weeks  plus eval   PT Treatment/Interventions ADLs/Self Care Home Management;Functional mobility training;Gait training;Stair training;Therapeutic activities;Therapeutic exercise;Balance training;Neuromuscular re-education;Patient/family education   PT Next Visit Plan Initiate HEP to address sit<>stand, posture, standing balance on compliant surfaces.  May try standing PWR! Moves, with multi-directional stepping.   Consulted and Agree with Plan of Care Patient;Family member/caregiver   Family Member Consulted wife (but patient is primary caregiver for his wife)      Patient will benefit from skilled therapeutic intervention in order to improve the following deficits and impairments:  Abnormal  gait, Decreased balance, Decreased mobility, Difficulty walking, Postural dysfunction  Visit Diagnosis: Other abnormalities of gait and mobility  Unsteadiness on feet  Abnormal posture      G-Codes - 2016/02/09 2101/04/29    Functional Assessment Tool Used 5x sit<>stand 14.68 sec, FGA 21/30; TUG 12.58 sec, TUG manual 13.9 sec   Functional Limitation Mobility: Walking and moving around   Mobility: Walking and Moving Around Current Status [phone]) At least 20 percent but less than 40 percent impaired, limited or restricted   Mobility: Walking and Moving Around Goal Status [phone]) At least 1 percent but less than 20 percent impaired, limited or restricted       Problem List Patient Active Problem List   Diagnosis Date Noted  . Pacemaker 09/24/2013  . Sinoatrial node dysfunction ([place]) 06/19/2013  . First degree AV block 04/09/2013  . Sinus node dysfunction ([place]) 04/09/2013  . Allergic rhinitis 03/17/2013  . Depressed bipolar I disorder in full remission ([place]) 12/19/2007  . DYSLIPIDEMIA 12/18/2006  . Obstructive sleep apnea 12/18/2006  . COPD mixed type ([place]) 12/18/2006  . MELANOMA, HX OF 12/18/2006    Westyn Keatley W. Feb 09, 2016, 9:04 PM  Frazier Butt., PT  [facility] [address] [place], [place], [address] Phone: [phone]   Fax:  [phone]  Name: Victor Osborne MRN: [id number] Date of Birth: 05-Apr-1932

## 2016-01-27 NOTE — Addendum Note (Signed)
Addended by: Frazier Butt on: 01/27/2016 09:24 PM   Modules accepted: Orders

## 2016-01-29 ENCOUNTER — Encounter: Payer: Self-pay | Admitting: Cardiology

## 2016-02-02 ENCOUNTER — Ambulatory Visit: Payer: Medicare Other | Admitting: Physical Therapy

## 2016-02-04 ENCOUNTER — Ambulatory Visit: Payer: Medicare Other | Admitting: Family Medicine

## 2016-02-04 ENCOUNTER — Ambulatory Visit: Payer: Medicare Other | Admitting: Physical Therapy

## 2016-02-08 ENCOUNTER — Ambulatory Visit: Payer: Medicare Other | Admitting: Physical Therapy

## 2016-02-08 ENCOUNTER — Ambulatory Visit: Payer: Medicare Other | Admitting: Internal Medicine

## 2016-02-09 ENCOUNTER — Ambulatory Visit (INDEPENDENT_AMBULATORY_CARE_PROVIDER_SITE_OTHER): Payer: Medicare Other | Admitting: Family Medicine

## 2016-02-09 ENCOUNTER — Other Ambulatory Visit: Payer: Self-pay | Admitting: Family Medicine

## 2016-02-09 ENCOUNTER — Encounter: Payer: Self-pay | Admitting: Family Medicine

## 2016-02-09 VITALS — BP 138/80 | HR 63 | Temp 97.9°F | Ht 76.0 in | Wt 213.7 lb

## 2016-02-09 DIAGNOSIS — N289 Disorder of kidney and ureter, unspecified: Secondary | ICD-10-CM | POA: Diagnosis not present

## 2016-02-09 LAB — BASIC METABOLIC PANEL
BUN: 28 mg/dL — AB (ref 6–23)
CALCIUM: 9.1 mg/dL (ref 8.4–10.5)
CO2: 29 mEq/L (ref 19–32)
CREATININE: 1.21 mg/dL (ref 0.40–1.50)
Chloride: 110 mEq/L (ref 96–112)
GFR: 61.13 mL/min (ref >60.00)
Glucose, Bld: 89 mg/dL (ref 70–99)
Potassium: 4.4 mEq/L (ref 3.5–5.1)
Sodium: 144 mEq/L (ref 135–145)

## 2016-02-09 NOTE — Progress Notes (Signed)
Pre visit review using our clinic review tool, if applicable. No additional management support is needed unless otherwise documented below in the visit note. 

## 2016-02-09 NOTE — Addendum Note (Signed)
Addended by: Elmer Picker on: 02/09/2016 01:50 PM   Modules accepted: Orders

## 2016-02-09 NOTE — Patient Instructions (Signed)
BEFORE YOU LEAVE: -follow up: 4 months -labs  Stop aleve and use as infrequently as possible. If you use tylenol - also limit to as needed and follow dosing instructions carefully.

## 2016-02-09 NOTE — Progress Notes (Signed)
HPI:  Victor Osborne is a pleasant 83 yo with a PMH significant for COPD, obstructive sleep apnea, symptomatic bradycardia status post pacemaker placement, hyperlipidemia, malignant melanoma, psoriasis, BPH, bipolar disorder,hypothyroidism, Parkinson's disease, and chronic low back pain here for follow-up. He sees a number of specialists for management of that most of his problems. See summary of his chronic health conditions below. His renal function was a little off on his last labs. I found out today that he is taking 2 Aleve twice a day prophylactically for knee pain. He reports he has been doing this for some time. He did not know that this could cause problems with his kidneys. He does not feel that he actually needs daily. He just thought that it was harmless. He sees a urologist for congenital UPJ obstruction unilateral. He is due for a pneumonia vaccine, shingles vaccine and a recheck of his kidney function.   COPD with emphysema; OSA: -sees pulmonologist -meds: none -long hx smoking, quit > 20 years ago  symptomatic brady s/p pacemaker placement/sinoatrial node dysfunction/HLD: -sees cardiologist -meds: pravastatin  Hx Malignant Melanoma/Psoriasis: -managed by dermatolgist -Dr. Delman Cheadle - Dermatology Specialist  UPJ congenital obstruction/Prostate hypertrophy: -sees [facility]  Bipolar disorder: -managed by Dr. Creig Hines, Psychiatry -medications: tegretol, seroquel, prozac -remote hx hospitalization twice in the past -retired Engineer, structural  Parkinson's Dz: -saw neurology in the past -did rehab -on sinamet,  -feels getting worse over time, walks with cane -balance worsening, very cautious, no falls recently  Hypothiroidism -no hx cancer -takes levothyroxine 129mg  Sacroiliitis/low back pain: -rarely uses tramadol for this on bad days - < 1/2 times per month for bad pain if needs to be active -well aware of risks  ROS: See pertinent positives and  negatives per HPI.  Past Medical History:  Diagnosis Date  . Bipolar 1 disorder ([place]   . [place] disease) [place]   . Depression   . First degree AV block   . Hypertension    pt denies but was in medical record  . Hypoglycemia, unspecified   . Myalgia and myositis, unspecified   . Obesity   . Pneumonia 1970  . Skin cancer (melanoma) ([place]   . Thyroid disease   . Unspecified hypothyroidism   . UPJ obstruction, congenital     Past Surgical History:  Procedure Laterality Date  . APPENDECTOMY  1939  . malignant melanoma removed from right forehead  2008  . PERMANENT PACEMAKER INSERTION N/A 06/19/2013   Procedure: PERMANENT PACEMAKER INSERTION;  Surgeon: GEvans Lance MD;  [facility];  Service: Cardiovascular;  Laterality: N/A;  . right big toe  surgery  2009  . TRANSURETHRAL RESECTION OF PROSTATE  02/04/2011   Procedure: TRANSURETHRAL RESECTION OF THE PROSTATE (TURP);  Surgeon: LDutch Gray MD;  Location: [facility];  Service: Urology;  Laterality: N/A;  . URETHRA SURGERY  2010   reconstruction    Family History  Problem Relation Age of Onset  . Cerebral aneurysm Mother   . Lung cancer Father   . Drug abuse Daughter     Social History   Social History  . Marital status: Married    Spouse name: N/A  . Number of children: N/A  . Years of education: N/A   Occupational History  . retired    Social History Main Topics  . Smoking status: Former Smoker    Packs/day: 2.00    Years: 40.00    Types: Cigarettes  . Smokeless tobacco: Never Used  .  Alcohol use No  . Drug use: No  . Sexual activity: Not Asked   Other Topics Concern  . None   Social History Narrative   Work or School: retire Wellsite geologist Situation: lives with wife      Spiritual Beliefs: episcopalian      Lifestyle: active with yard work; diet ok        Current Outpatient Prescriptions:  .  albuterol (PROVENTIL HFA;VENTOLIN HFA) 108 (90 BASE)  MCG/ACT inhaler, Inhale into the lungs. 1-2 puffs every 6 hours if needed for shortness of breath or wheezing- rescue inhaler, Disp: , Rfl:  .  alclomethasone (ACLOVATE) 0.05 % ointment, Apply 1 application topically 2 (two) times daily as needed (for rash)., Disp: , Rfl:  .  Ascorbic Acid (VITAMIN C) 1000 MG tablet, Take 1,000 mg by mouth 2 (two) times daily. , Disp: , Rfl:  .  aspirin 81 MG tablet, Take 81 mg by mouth daily., Disp: , Rfl:  .  carbamazepine (TEGRETOL) 200 MG tablet, Take 200 mg by mouth 5 (five) times daily. , Disp: , Rfl:  .  carbidopa-levodopa (SINEMET IR) 25-100 MG tablet, TAKE 1 TABLET BY MOUTH 4 (FOUR) TIMES DAILY., Disp: 360 tablet, Rfl: 1 .  cholecalciferol (VITAMIN D) 1000 UNITS tablet, Take 2,000 Units by mouth daily. , Disp: , Rfl:  .  desoximetasone (TOPICORT) 0.25 % cream, Apply 1 application topically 2 (two) times daily., Disp: , Rfl:  .  Flaxseed, Linseed, (FLAX SEED OIL) 1300 MG CAPS, Take 1 capsule by mouth daily. , Disp: , Rfl:  .  FLUoxetine (PROZAC) 20 MG tablet, Take 20 mg by mouth daily. , Disp: , Rfl:  .  levothyroxine (LEVOTHROID) 125 MCG tablet, Take 1 tablet (125 mcg total) by mouth daily before breakfast., Disp: 90 tablet, Rfl: 3 .  Multiple Vitamin (MULITIVITAMIN WITH MINERALS) TABS, Take 1 tablet by mouth daily., Disp: , Rfl:  .  naproxen sodium (ANAPROX) 220 MG tablet, Take 440 mg by mouth daily as needed (pain). , Disp: , Rfl:  .  Omega-3 Fatty Acids (OMEGA-3 CF PO), Take 1,200 mg by mouth daily. , Disp: , Rfl:  .  pravastatin (PRAVACHOL) 40 MG tablet, Take 1 tablet (40 mg total) by mouth daily., Disp: 90 tablet, Rfl: 3 .  SEROQUEL XR 50 MG TB24 24 hr tablet, Take 50 mg by mouth at bedtime., Disp: , Rfl: 3  EXAM:  Vitals:   02/09/16 1310  BP: 138/80  Pulse: 63  Temp: 97.9 F (36.6 C)    Body mass index is 26.01 kg/m.  GENERAL: vitals reviewed and listed above, alert, oriented, appears well hydrated and in no acute distress  HEENT:  atraumatic, conjunttiva clear, no obvious abnormalities on inspection of external nose and ears  NECK: no obvious masses on inspection  LUNGS: clear to auscultation bilaterally, no wheezes, rales or rhonchi, good air movement  CV: HRRR, no peripheral edema  MS: moves all extremities without noticeable abnormality  PSYCH: pleasant and cooperative, no obvious depression or anxiety  ASSESSMENT AND PLAN:  Discussed the following assessment and plan:  Renal insufficiency - Plan: BMP with eGFR  -will recheck renal function per his request -advised he consider stopping nsaids and reviewed common products and side effects -discussed other options for prn use for pain and he will try tylenol -recheck bmp in 3-4 months -Patient advised to return or notify a doctor immediately if symptoms worsen or persist or new concerns arise.  Patient Instructions  BEFORE YOU LEAVE: -follow up: 4 months -labs  Stop aleve and use as infrequently as possible. If you use tylenol - also limit to as needed and follow dosing instructions carefully.    [name] R., DO

## 2016-02-10 ENCOUNTER — Ambulatory Visit: Payer: Medicare Other | Admitting: Physical Therapy

## 2016-02-16 ENCOUNTER — Ambulatory Visit: Payer: Medicare Other | Admitting: Physical Therapy

## 2016-02-18 ENCOUNTER — Ambulatory Visit: Payer: Medicare Other | Admitting: Physical Therapy

## 2016-02-23 ENCOUNTER — Ambulatory Visit: Payer: Medicare Other | Admitting: Physical Therapy

## 2016-02-26 ENCOUNTER — Ambulatory Visit: Payer: Medicare Other | Admitting: Physical Therapy

## 2016-03-16 ENCOUNTER — Telehealth: Payer: Self-pay | Admitting: Clinical

## 2016-03-18 NOTE — Progress Notes (Signed)
Victor Osborne was seen today in the movement disorders clinic for neurologic consultation at the request of Victor Osborne., DO.  The consultation is for the evaluation of Parkinsons disease.  The records that were made available to me were reviewed.  Pt apparently saw Dr. Krista Blue years ago (2012) for gait change.  I tried to get her records but they were not sent and are not scanned into the system.  Pt is a 83 y.o. male with a hx of bipolar d/o managed by Dr. Creig Hines, Psychiatry (on tegretol, seroquel, prozac).  States that he was on lithium and stellazine before this.  Lithium "ruined" my thyroid and it was changed to his current regimen years ago.   He states that his primary care doctor is the one who put him on levodopa but pt didn't know he was dx with PD until he looked at records.   Reports that his PCP prescribed levodopa many years ago ("3 or 4 or more, but I don't know")  Pt is currently on carbidopa/levodopa 25/100 tid (AM, noon, bedtime).    01/07/16 update:  The patient returns for follow-up, accompanied by his wife who supplements the history.  He had a DaT scan on 11/25/2015.  This demonstrated abnormal image grade 3: absent putamen activity in both hemispheres with asymmetrically reduced caudate activity.  He remains on carbidopa/levodopa 25/100, one tablet 3 times per day.  He tried to move them closer together but he has trouble remembering it so often takes the last one before bedtime.  No formal exercise, but he takes care of the home and his wife and the dogs.   He continues to have tremor, but no falls.  No lightheadedness or near syncope.  He does have bipolar disorder and remains on Tegretol, Prozac and Seroquel XR, 50 mg daily.  He has not had any hallucinations.  03/22/16 update:  Patient returns today for follow-up.  Overall, the patient feels that he has been doing fairly well in terms of his physical health, but he has been overwhelmed with the caregiving for his wife.  He  remains on carbidopa/levodopa 25/100, one tablet 3 times per day.  "I feel pretty good."   He has some tremor.  He did have a fall but he tripped over the dog.  He didn't get hurt.  No other falls.    No lightheadedness or near syncope.  He remains on several medications for bipolar disorder, including Seroquel XR, 50 mg daily.  He has not had any hallucinations.  No lightheadedness or near syncope.  Neuroimaging has previously been performed.   I reviewed his CT of the brain done on 03/25/2014.  There was atrophy, particularly in the frontal region.  ALLERGIES:  No Known Allergies  CURRENT MEDICATIONS:  Outpatient Encounter Prescriptions as of 03/22/2016  Medication Sig  . alclomethasone (ACLOVATE) 0.05 % ointment Apply 1 application topically 2 (two) times daily as needed (for rash).  Marland Kitchen aspirin 81 MG tablet Take 81 mg by mouth daily.  . carbamazepine (TEGRETOL) 200 MG tablet Take 200 mg by mouth 5 (five) times daily.   . carbidopa-levodopa (SINEMET IR) 25-100 MG tablet TAKE 1 TABLET BY MOUTH 4 (FOUR) TIMES DAILY. (Patient taking differently: Take 1 tablet by mouth 3 (three) times daily. )  . cholecalciferol (VITAMIN D) 1000 UNITS tablet Take 2,000 Units by mouth daily.   Marland Kitchen desoximetasone (TOPICORT) 0.25 % cream Apply 1 application topically 2 (two) times daily.  . Flaxseed, Linseed, (FLAX  SEED OIL) 1300 MG CAPS Take 1 capsule by mouth daily.   Marland Kitchen FLUoxetine (PROZAC) 20 MG tablet Take 20 mg by mouth daily.   Marland Kitchen levothyroxine (LEVOTHROID) 125 MCG tablet Take 1 tablet (125 mcg total) by mouth daily before breakfast.  . Multiple Vitamin (MULITIVITAMIN WITH MINERALS) TABS Take 1 tablet by mouth daily.  . Omega-3 Fatty Acids (OMEGA-3 CF PO) Take 1,200 mg by mouth daily.   . pravastatin (PRAVACHOL) 40 MG tablet Take 1 tablet (40 mg total) by mouth daily.  . SEROQUEL XR 50 MG TB24 24 hr tablet Take 50 mg by mouth at bedtime.  . [DISCONTINUED] Ascorbic Acid (VITAMIN C) 1000 MG tablet Take 1,000 mg by  mouth 2 (two) times daily.   Marland Kitchen albuterol (PROVENTIL HFA;VENTOLIN HFA) 108 (90 BASE) MCG/ACT inhaler Inhale into the lungs. 1-2 puffs every 6 hours if needed for shortness of breath or wheezing- rescue inhaler  . [DISCONTINUED] naproxen sodium (ANAPROX) 220 MG tablet Take 440 mg by mouth daily as needed (pain).    No facility-administered encounter medications on file as of 03/22/2016.     PAST MEDICAL HISTORY:   Past Medical History:  Diagnosis Date  . Bipolar 1 disorder ([place])   . COPD (chronic obstructive pulmonary disease) ([place])   . Depression   . First degree AV block   . Hypertension    pt denies but was in medical record  . Hypoglycemia, unspecified   . Myalgia and myositis, unspecified   . Obesity   . Pneumonia 1970  . Skin cancer (melanoma) ([place])   . Thyroid disease   . Unspecified hypothyroidism   . UPJ obstruction, congenital     PAST SURGICAL HISTORY:   Past Surgical History:  Procedure Laterality Date  . APPENDECTOMY  1939  . malignant melanoma removed from right forehead  2008  . PERMANENT PACEMAKER INSERTION N/A 06/19/2013   Procedure: PERMANENT PACEMAKER INSERTION;  Surgeon: Evans Lance, MD;  Location: [facility] CATH LAB;  Service: Cardiovascular;  Laterality: N/A;  . right big toe  surgery  2009  . TRANSURETHRAL RESECTION OF PROSTATE  02/04/2011   Procedure: TRANSURETHRAL RESECTION OF THE PROSTATE (TURP);  Surgeon: Dutch Gray, MD;  Location: [facility] ORS;  Service: Urology;  Laterality: N/A;  . URETHRA SURGERY  2010   reconstruction    SOCIAL HISTORY:   Social History   Social History  . Marital status: Married    Spouse name: N/A  . Number of children: N/A  . Years of education: N/A   Occupational History  . retired    Social History Main Topics  . Smoking status: Former Smoker    Packs/day: 2.00    Years: 40.00    Types: Cigarettes  . Smokeless tobacco: Never Used  . Alcohol use No  . Drug use: No  . Sexual activity: Not on file   Other Topics Concern    . 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 Mother Deceased  . Father Deceased  . Maternal Grandmother Deceased  . Maternal Grandfather Deceased  . Paternal Grandmother Deceased  . Paternal Grandfather Deceased  . Sister Alive  . Brother Alive  . Daughter Alive  .  Daughter Deceased    ROS:  A complete 10 system review of systems was obtained and was unremarkable apart from what is mentioned above.  PHYSICAL EXAMINATION:    VITALS:   Vitals:   03/22/16 1248  BP: 120/80  Pulse: 60  SpO2: 95%  Weight: 211 lb (95.7 kg)  Height: 6\' 4"  (1.93 m)    GEN:  The patient appears stated age and is in NAD. HEENT:  Normocephalic, atraumatic.  The mucous membranes are moist. The superficial temporal arteries are without ropiness or tenderness. CV:  RRR Lungs:  CTAB Neck/HEME:  There are no carotid bruits bilaterally.  Neurological examination:  Orientation:  Montreal Cognitive Assessment  03/22/2016  Visuospatial/ Executive (0/5) 1  Naming (0/3) 3  Attention: Read list of digits (0/2) 2  Attention: Read list of letters (0/1) 1  Attention: Serial 7 subtraction starting at 100 (0/3) 1  Language: Repeat phrase (0/2) 2  Language : Fluency (0/1) 1  Abstraction (0/2) 2  Delayed Recall (0/5) 4  Orientation (0/6) 5  Total 22  Adjusted Score (based on education) 22   Cranial nerves: There is good facial symmetry. Pupils are equal round and reactive to light bilaterally. Fundoscopic exam reveals clear margins bilaterally. Extraocular muscles are intact. The visual fields are full to confrontational testing. The speech is fluent and clear. Soft palate rises symmetrically and there is no tongue deviation. Hearing is intact to conversational tone. Sensation: Sensation  is intact to light and pinprick throughout (facial, trunk, extremities). Vibration is decreased at the bilateral big toe. There is no extinction with double simultaneous stimulation. There is no sensory dermatomal level identified. Motor: Strength is 5/5 in the bilateral upper and lower extremities.   Shoulder shrug is equal and symmetric.  There is no pronator drift. Deep tendon reflexes: Deep tendon reflexes are 3/4 at the bilateral biceps, triceps, brachioradialis, patella and achilles.  He has bilateral pectoralis reflexes.  Plantar responses are downgoing bilaterally.  Movement examination: Tone: There is normal tone in the UE/LE Abnormal movements: There is L more than RUE resting UE resting tremor that is moderate amplitude Coordination:  There is no decremation with RAM's, with any form of RAMS, including alternating supination and pronation of the forearm, hand opening and closing, finger taps, heel taps and toe taps. Gait and Station: The patient has no difficulty arising out of a deep-seated chair without the use of the hands. The patient's stride length is normal, and he has re-emergent tremor on the L  ASSESSMENT/PLAN:  1.  Parkinsonism.  -Had a DaT scan that was abnormal on 11/25/15  -continue carbidopa/levodopa 25/100 tid.  He is doing well.  Caregiving burden is biggest issue right now.    -Discussed that we used to believe that levodopa would increase risk of melanoma but now believe that PD increases risk of melanoma.  He has had two of them and gets skin checks q 6 months.  -safety discussed  -community exercise programs discussed  -send referral to neurorehab unit  2.  Hyperreflexia  -he has no neck pain.  He cannot have an MRI due to PPM.  He did have a CT neck in 2014 and it demonstrated moderate to severe multilevel degenerative changes in the neck.    3.  F/u after above completed.  Much greater than 50% of this visit was spent in counseling and coordinating care.  Total  face to face time:  25 min   Cc:  [name] R., DO

## 2016-03-22 ENCOUNTER — Ambulatory Visit (INDEPENDENT_AMBULATORY_CARE_PROVIDER_SITE_OTHER): Payer: Medicare Other | Admitting: Neurology

## 2016-03-22 ENCOUNTER — Encounter: Payer: Self-pay | Admitting: Neurology

## 2016-03-22 VITALS — BP 120/80 | HR 60 | Ht 76.0 in | Wt 211.0 lb

## 2016-03-22 DIAGNOSIS — F3162 Bipolar disorder, current episode mixed, moderate: Secondary | ICD-10-CM | POA: Diagnosis not present

## 2016-03-22 DIAGNOSIS — G20 Parkinson's disease: Secondary | ICD-10-CM | POA: Diagnosis not present

## 2016-03-24 ENCOUNTER — Encounter: Payer: Self-pay | Admitting: Internal Medicine

## 2016-03-24 ENCOUNTER — Ambulatory Visit: Payer: Medicare Other | Admitting: Internal Medicine

## 2016-03-24 ENCOUNTER — Ambulatory Visit (INDEPENDENT_AMBULATORY_CARE_PROVIDER_SITE_OTHER): Payer: Medicare Other | Admitting: Internal Medicine

## 2016-03-24 VITALS — BP 118/80 | HR 60 | Ht 76.0 in | Wt 210.4 lb

## 2016-03-24 DIAGNOSIS — G4733 Obstructive sleep apnea (adult) (pediatric): Secondary | ICD-10-CM | POA: Diagnosis not present

## 2016-03-24 DIAGNOSIS — J449 Chronic obstructive pulmonary disease, unspecified: Secondary | ICD-10-CM | POA: Diagnosis not present

## 2016-03-24 NOTE — Progress Notes (Signed)
HPI M former smoker, followed for COPD, rhinitis, OSA, complicated by BiPolar, HBP, hx melanoma, DM, pacemaker, Parkinsons NPSG 02/29/96- AHI 79/ hr, desaturation to 87%, body weight 250 lbs 6 MWT- 99%, 100%, 99%, 510 meters PFT 07/07/2011-mild obstructive airways disease, small airways, insignificant response to bronchodilator. Normal lung volumes, normal diffusion capacity. FEV1/FVC 0.61  --------------------------------------------------------------------------------------------------------   02/06/2015-83 year old male former smoker followed for COPD, rhinitis, OSA, complicated by bipolar, HBP, history melanoma, DM, pacemaker CPAP 9/[facility] FOLLOWS FOR:  Wears CPAP nightly. Denies any problems with mask or pressure. Pt states that breathing is doing good - no new complaints. Now using [facility] Says his breathing is "fine" paces himself but does some yard work. Never needs inhalers.  03/24/2016-83 year old male former smoker followed for COPD, rhinitis, OSA, complicated by bipolar, HBP, history melanoma, DM 2, pacemaker, + Parkinson's CPAP 9/[facility] OSA follow up. Wants to see if he can get a new machine. Breathing has been ok since last visit. Having to care for wife who has very severe Parkinson's disease and comes today in a wheelchair.   Says he is breathing well. No routine cough or wheeze. Never uses inhalers CPAP very comfortable used every night and sleeps better with it. Download not available. He asks about replacing old machine.  ROS-see HPI Constitutional:   No-   weight loss, night sweats, fevers, chills, fatigue, lassitude. HEENT:   No-  headaches, difficulty swallowing, tooth/dental problems, sore throat,       No-  sneezing, itching, ear ache, nasal congestion, post nasal drip,  CV:  No-   chest pain, orthopnea, PND, swelling in lower extremities, anasarca, dizziness, palpitations Resp: +  shortness of breath with exertion or at rest.              No-productive cough,   No non-productive cough,  No- coughing up of blood.              No-   change in color of mucus.  No- wheezing.   Skin: No-   rash or lesions. GI:  No-   heartburn, indigestion, abdominal pain, nausea, vomiting,  GU:  MS:  + joint pain or swelling. Weakness and falls. Neuro-     tremor Psych:  No- change in mood or affect. No depression or anxiety.  No memory loss.  OBJ   exam similar to last visit General- Alert, Oriented, Affect-appropriate, Distress- none acute, looks well Skin- rash-none, lesions- none, excoriation- none Lymphadenopathy- none Head- atraumatic            Eyes- Gross vision intact, PERRLA, conjunctivae clear secretions,             Ears- Hearing, canals-normal            Nose- Clear, no-Septal dev, mucus, polyps, erosion, perforation             Throat- Mallampati II , mucosa clear , drainage- none, tonsils- atrophic Neck- flexible , trachea midline, no stridor , thyroid nl, carotid no bruit Chest - symmetrical excursion , unlabored           Heart/CV-  Pulse is RRR occasional skip , no murmur , no gallop  , no rub, nl s1 s2                           - JVD- none , edema- none, stasis changes- none, varices- none           Lung- clear, unlabored, wheeze-  none, cough- none , dullness-none,  rub- none           Chest wall- + left pacemaker Abd-  Br/ Gen/ Rectal- Not done, not indicated Extrem- cyanosis- none, clubbing, none, atrophy- none, strength- nl, using a cane.  Neuro- +resting tremor of hands

## 2016-03-24 NOTE — Patient Instructions (Signed)
Order- [facility]- please replace old CPAP machine- auto 5-15, mask of choice, humidifier, supplies, AirView, dx OSA  Please call if we can help

## 2016-03-25 NOTE — Assessment & Plan Note (Signed)
He has been very compliant with CPAP and it definitely helps him. Machine is now old. We will take the opportunity to replace it with on AutoPap capable machine for pressure range 5-15

## 2016-03-25 NOTE — Assessment & Plan Note (Signed)
He denies any respiratory symptoms now within his level of exertion. He is not using inhalers and denies cough or wheeze. Chest exam is clear.

## 2016-03-30 ENCOUNTER — Encounter: Payer: Self-pay | Admitting: Neurology

## 2016-04-12 ENCOUNTER — Ambulatory Visit (INDEPENDENT_AMBULATORY_CARE_PROVIDER_SITE_OTHER): Payer: Medicare Other | Admitting: *Deleted

## 2016-04-12 DIAGNOSIS — I495 Sick sinus syndrome: Secondary | ICD-10-CM

## 2016-04-12 NOTE — Progress Notes (Signed)
Remote pacemaker transmission.   

## 2016-04-14 LAB — CUP PACEART REMOTE DEVICE CHECK
Battery Remaining Percentage: 100 %
Brady Statistic RA Percent Paced: 81 %
Brady Statistic RV Percent Paced: 100 %
Date Time Interrogation Session: 20180327042200
Implantable Lead Implant Date: 20150603
Implantable Lead Location: 753859
Implantable Lead Serial Number: 29477746
Lead Channel Impedance Value: 707 Ohm
Lead Channel Pacing Threshold Amplitude: 0.7 V
Lead Channel Pacing Threshold Pulse Width: 0.5 ms
Lead Channel Setting Pacing Amplitude: 1.6 V
Lead Channel Setting Pacing Pulse Width: 0.4 ms
Lead Channel Setting Sensing Sensitivity: 2.5 mV
MDC IDC LEAD IMPLANT DT: 20150603
MDC IDC LEAD LOCATION: 753860
MDC IDC LEAD SERIAL: 29617326
MDC IDC MSMT BATTERY REMAINING LONGEVITY: 120 mo
MDC IDC MSMT LEADCHNL RV IMPEDANCE VALUE: 637 Ohm
MDC IDC MSMT LEADCHNL RV PACING THRESHOLD AMPLITUDE: 1.1 V
MDC IDC MSMT LEADCHNL RV PACING THRESHOLD PULSEWIDTH: 0.4 ms
MDC IDC PG IMPLANT DT: 20150603
MDC IDC SET LEADCHNL RA PACING AMPLITUDE: 2 V
Pulse Gen Serial Number: 389736

## 2016-04-15 ENCOUNTER — Encounter: Payer: Self-pay | Admitting: Cardiology

## 2016-04-18 ENCOUNTER — Telehealth: Payer: Self-pay

## 2016-04-18 NOTE — Telephone Encounter (Signed)
Patient called to report that this morning he accidentally took his wife's Losartan 25mg  instead of his medication. He denies any associated symptoms such as dizziness, lightheaded or decreased heart rate (pacemaker). He states that he feels he is in his normal state of health. Advised pt to monitor for any s/s of low BP and to call office if he feels any increase in symptoms. Pt voiced understanding. Nothing further needed at this time.

## 2016-04-29 ENCOUNTER — Encounter: Payer: Self-pay | Admitting: Cardiology

## 2016-05-23 ENCOUNTER — Telehealth: Payer: Self-pay | Admitting: Neurology

## 2016-05-23 DIAGNOSIS — G20 Parkinson's disease: Secondary | ICD-10-CM

## 2016-05-23 NOTE — Telephone Encounter (Signed)
Patient states order expired with neuro rehab. New order sent for him and his wife.

## 2016-05-23 NOTE — Telephone Encounter (Signed)
Caller: PT  Urgent? No  Reason for the call: PT stated that both him and his wife Victor Osborne needed a prescription for rehabilitation because medicare would pay if they did

## 2016-06-02 NOTE — Telephone Encounter (Signed)
Erroneous encounter

## 2016-06-06 NOTE — Progress Notes (Deleted)
HPI:  Victor Osborne is a pleasant 83 y.o. here for follow up. Chronic medical problems summarized below were reviewed for changes and stability and were updated as needed below. These issues and their treatment remain stable for the most part. ***. Denies CP, SOB, DOE, treatment intolerance or new symptoms.  COPD with emphysema; OSA: -sees pulmonologist -meds: none -long hx smoking, quit > 20 years ago  symptomatic brady s/p pacemaker placement/sinoatrial node dysfunction/HLD: -sees cardiologist -meds: pravastatin  Hx Malignant Melanoma/Psoriasis: -managed by dermatolgist -Dr. Delman Osborne - Dermatology Specialist  UPJ congenital obstruction/Prostate hypertrophy: -sees [facility]  Bipolar disorder: -managed by Dr. Creig Osborne, Psychiatry -medications: tegretol, seroquel, prozac -remote hx hospitalization twice in the past -retired Engineer, structural  Parkinson's Dz: -saw neurology in the past -did rehab -on sinamet,  -feels getting worse over time, walks with cane -balance worsening, very cautious, no falls recently  Hypothiroidism -no hx cancer -takes levothyroxine 143mcg  Sacroiliitis/low back pain: -rarely uses tramadol for this on bad days - < 1/2 times per month for bad pain if needs to be active -well aware of risks  ROS: See pertinent positives and negatives per HPI.  Past Medical History:  Diagnosis Date  . Bipolar 1 disorder ([place])   . COPD (chronic obstructive pulmonary disease) ([place])   . Depression   . First degree AV block   . Hypertension    pt denies but was in medical record  . Hypoglycemia, unspecified   . Myalgia and myositis, unspecified   . Obesity   . Pneumonia 1970  . Skin cancer (melanoma) ([place])   . Thyroid disease   . Unspecified hypothyroidism   . UPJ obstruction, congenital     Past Surgical History:  Procedure Laterality Date  . APPENDECTOMY  1939  . malignant melanoma removed from right forehead  2008  . PERMANENT  PACEMAKER INSERTION N/A 06/19/2013   Procedure: PERMANENT PACEMAKER INSERTION;  Surgeon: Victor Lance, MD;  Location: [facility] CATH LAB;  Service: Cardiovascular;  Laterality: N/A;  . right big toe  surgery  2009  . TRANSURETHRAL RESECTION OF PROSTATE  02/04/2011   Procedure: TRANSURETHRAL RESECTION OF THE PROSTATE (TURP);  Surgeon: Victor Gray, MD;  Location: [facility] ORS;  Service: Urology;  Laterality: N/A;  . URETHRA SURGERY  2010   reconstruction    Family History  Problem Relation Age of Onset  . Cerebral aneurysm Mother   . Lung cancer Father   . Drug abuse Daughter     Social History   Social History  . Marital status: Married    Spouse name: N/A  . Number of children: N/A  . Years of education: N/A   Occupational History  . retired    Social History Main Topics  . Smoking status: Former Smoker    Packs/day: 2.00    Years: 40.00    Types: Cigarettes  . Smokeless tobacco: Never Used  . Alcohol use No  . Drug use: No  . Sexual activity: Not on file   Other Topics Concern  . 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  albuterol (PROVENTIL HFA;VENTOLIN HFA) 108 (90 BASE) MCG/ACT inhaler, Inhale into the lungs. 1-2 puffs every 6 hours if needed for  shortness of breath or wheezing- rescue inhaler, Disp: , Rfl:  .  alclomethasone (ACLOVATE) 0.05 % ointment, Apply 1 application topically 2 (two) times daily as needed (for rash)., Disp: , Rfl:  .  aspirin 81 MG tablet, Take 81 mg by mouth daily., Disp: , Rfl:  .  carbamazepine (TEGRETOL) 200 MG tablet, Take 200 mg by mouth 5 (five) times daily. , Disp: , Rfl:  .  carbidopa-levodopa (SINEMET IR) 25-100 MG tablet, TAKE 1 TABLET BY MOUTH 4 (FOUR) TIMES DAILY. (Patient taking differently: Take 1 tablet by mouth 3 (three) times  daily. ), Disp: 360 tablet, Rfl: 1 .  cholecalciferol (VITAMIN D) 1000 UNITS tablet, Take 2,000 Units by mouth daily. , Disp: , Rfl:  .  desoximetasone (TOPICORT) 0.25 % cream, Apply 1 application topically 2 (two) times daily., Disp: , Rfl:  .  Flaxseed, Linseed, (FLAX SEED OIL) 1300 MG CAPS, Take 1 capsule by mouth daily. , Disp: , Rfl:  .  FLUoxetine (PROZAC) 20 MG tablet, Take 20 mg by mouth daily. , Disp: , Rfl:  .  levothyroxine (LEVOTHROID) 125 MCG tablet, Take 1 tablet (125 mcg total) by mouth daily before breakfast., Disp: 90 tablet, Rfl: 3 .  Multiple Vitamin (MULITIVITAMIN WITH MINERALS) TABS, Take 1 tablet by mouth daily., Disp: , Rfl:  .  Omega-3 Fatty Acids (OMEGA-3 CF PO), Take 1,200 mg by mouth daily. , Disp: , Rfl:  .  pravastatin (PRAVACHOL) 40 MG tablet, Take 1 tablet (40 mg total) by mouth daily., Disp: 90 tablet, Rfl: 3 .  SEROQUEL XR 50 MG TB24 24 hr tablet, Take 50 mg by mouth at bedtime., Disp: , Rfl: 3  EXAM:  There were no vitals filed for this visit.  There is no height or weight on file to calculate BMI.  GENERAL: vitals reviewed and listed above, alert, oriented, appears well hydrated and in no acute distress  HEENT: atraumatic, conjunttiva clear, no obvious abnormalities on inspection of external nose and ears  NECK: no obvious masses on inspection  LUNGS: clear to auscultation bilaterally, no wheezes, rales or rhonchi, good air movement  CV: HRRR, no peripheral edema  MS: moves all extremities without noticeable abnormality  PSYCH: pleasant and cooperative, no obvious depression or anxiety  ASSESSMENT AND PLAN:  Discussed the following assessment and plan:  No diagnosis found.  -Patient advised to return or notify a doctor immediately if symptoms worsen or persist or new concerns arise.  There are no Patient Instructions on file for this visit.  Victor Benton R., DO

## 2016-06-07 ENCOUNTER — Ambulatory Visit: Payer: Medicare Other | Admitting: Family Medicine

## 2016-06-07 ENCOUNTER — Encounter: Payer: Self-pay | Admitting: Family Medicine

## 2016-06-07 ENCOUNTER — Telehealth: Payer: Self-pay | Admitting: *Deleted

## 2016-06-07 NOTE — Progress Notes (Signed)
No show for follow up

## 2016-06-07 NOTE — Telephone Encounter (Signed)
Patient was a no-show for today's appt.  I called the pt to check on him and he stated he spoke with someone here who told him the appt was cancelled.  Appt rescheduled for 5/24.

## 2016-06-09 ENCOUNTER — Encounter: Payer: Self-pay | Admitting: Physical Therapy

## 2016-06-09 ENCOUNTER — Ambulatory Visit (INDEPENDENT_AMBULATORY_CARE_PROVIDER_SITE_OTHER): Payer: Medicare Other | Admitting: Family Medicine

## 2016-06-09 ENCOUNTER — Encounter: Payer: Self-pay | Admitting: Family Medicine

## 2016-06-09 VITALS — BP 108/58 | HR 61 | Temp 97.9°F | Ht 76.0 in | Wt 214.4 lb

## 2016-06-09 DIAGNOSIS — I495 Sick sinus syndrome: Secondary | ICD-10-CM

## 2016-06-09 DIAGNOSIS — D692 Other nonthrombocytopenic purpura: Secondary | ICD-10-CM

## 2016-06-09 DIAGNOSIS — Z950 Presence of cardiac pacemaker: Secondary | ICD-10-CM

## 2016-06-09 DIAGNOSIS — F3176 Bipolar disorder, in full remission, most recent episode depressed: Secondary | ICD-10-CM

## 2016-06-09 DIAGNOSIS — E039 Hypothyroidism, unspecified: Secondary | ICD-10-CM

## 2016-06-09 DIAGNOSIS — J449 Chronic obstructive pulmonary disease, unspecified: Secondary | ICD-10-CM

## 2016-06-09 DIAGNOSIS — N289 Disorder of kidney and ureter, unspecified: Secondary | ICD-10-CM | POA: Diagnosis not present

## 2016-06-09 LAB — BASIC METABOLIC PANEL
BUN: 23 mg/dL (ref 6–23)
CALCIUM: 8.9 mg/dL (ref 8.4–10.5)
CO2: 28 mEq/L (ref 19–32)
Chloride: 110 mEq/L (ref 96–112)
Creatinine, Ser: 1.19 mg/dL (ref 0.40–1.50)
GFR: 62.27 mL/min (ref >60.00)
GLUCOSE: 95 mg/dL (ref 70–99)
Potassium: 4.2 mEq/L (ref 3.5–5.1)
SODIUM: 143 meq/L (ref 135–145)

## 2016-06-09 LAB — TSH: TSH: 2.05 u[IU]/mL (ref 0.35–4.50)

## 2016-06-09 NOTE — Patient Instructions (Signed)
BEFORE YOU LEAVE: -follow up: Annual exam with [name] and follow up with Dr. Maudie Mercury same day in 3 months -lab  We have ordered labs or studies at this visit. It can take up to 1-2 weeks for results and processing. IF results require follow up or explanation, we will call you with instructions. Clinically stable results will be released to your [facility]. If you have not heard from [place] or cannot find your results in [facility] in 2 weeks please contact our office at [phone].  If you are not yet signed up for [facility], please consider signing up.

## 2016-06-09 NOTE — Progress Notes (Signed)
HPI:  Victor Osborne is a pleasant 83 y.o. here for follow up. Chronic medical problems summarized below were reviewed for changes and stability and were updated as needed below. These issues and their treatment remain stable for the most part. Stopped nsaids due to renal insufficiency. Now he uses asa for pain which works well. Takes baby asa which he also takes daily for CV risk reduction.  He does not like that he bruises easily and wonders about risks/benefits daily baby asa. Also rare tramadol. Denies CP, SOB, bleeding, hx bleeding, DOE, treatment intolerance or new symptoms.  COPD with emphysema; OSA: -sees pulmonologist -meds: none -long hx smoking, quit > 20 years ago  symptomatic brady s/p pacemaker placement/sinoatrial node dysfunction/HLD: -sees cardiologist -meds: pravastatin  Hx Malignant Melanoma/Psoriasis: -managed by dermatolgist -Dr. Delman Cheadle - Dermatology Specialist  UPJ congenital obstruction/Prostate hypertrophy/Hx Renal insufficiency: -sees [facility] -stopped regular aleve use to see if helps renal function  Bipolar disorder: -managed by Dr. Creig Hines, Psychiatry -medications: tegretol, seroquel, prozac -remote hx hospitalization twice in the past -retired Engineer, structural  Parkinson's Dz: -seeing Dr. Carles Collet for management -on sinamet,  -walks with cane  Hypothyroidism: -no hx cancer -takes levothyroxine 182mcg  Sacroiliitis/low back pain: -rarely uses tramadol for this on bad days - < 1/2 times per month for bad pain if needs to be active -well aware of risks -was using aleve - but had some mild renal insufficiency so stopped  ROS: See pertinent positives and negatives per HPI.  Past Medical History:  Diagnosis Date  . Bipolar 1 disorder ([place])   . COPD (chronic obstructive pulmonary disease) ([place])   . Depression   . First degree AV block   . Hypertension    pt denies but was in medical record  . Hypoglycemia, unspecified   .  Myalgia and myositis, unspecified   . Obesity   . Pneumonia 1970  . Skin cancer (melanoma) ([place])   . Thyroid disease   . Unspecified hypothyroidism   . UPJ obstruction, congenital     Past Surgical History:  Procedure Laterality Date  . APPENDECTOMY  1939  . malignant melanoma removed from right forehead  2008  . PERMANENT PACEMAKER INSERTION N/A 06/19/2013   Procedure: PERMANENT PACEMAKER INSERTION;  Surgeon: Evans Lance, MD;  Location: [facility] CATH LAB;  Service: Cardiovascular;  Laterality: N/A;  . right big toe  surgery  2009  . TRANSURETHRAL RESECTION OF PROSTATE  02/04/2011   Procedure: TRANSURETHRAL RESECTION OF THE PROSTATE (TURP);  Surgeon: Dutch Gray, MD;  Location: [facility]S;  Service: Urology;  Laterality: N/A;  . URETHRA SURGERY  2010   reconstruction    Family History  Problem Relation Age of Onset  . Cerebral aneurysm Mother   . Lung cancer Father   . Drug abuse Daughter     Social History   Social History  . Marital status: Married    Spouse name: N/A  . Number of children: N/A  . Years of education: N/A   Occupational History  . retired    Social History Main Topics  . Smoking status: Former Smoker    Packs/day: 2.00    Years: 40.00    Types: Cigarettes  . Smokeless tobacco: Never Used  . Alcohol use No  . Drug use: No  . Sexual activity: Not Asked   Other Topics Concern  . None   Social History Narrative   Work or School: retire Wellsite geologist Situation: lives with  wife      Spiritual Beliefs: episcopalian      Lifestyle: active with yard work; diet ok        Current Outpatient Prescriptions:  .  albuterol (PROVENTIL HFA;VENTOLIN HFA) 108 (90 BASE) MCG/ACT inhaler, Inhale into the lungs. 1-2 puffs every 6 hours if needed for shortness of breath or wheezing- rescue inhaler, Disp: , Rfl:  .  alclomethasone (ACLOVATE) 0.05 % ointment, Apply 1 application topically 2 (two) times daily as needed (for rash)., Disp: , Rfl:  .  aspirin  81 MG tablet, Take 81 mg by mouth daily., Disp: , Rfl:  .  carbamazepine (TEGRETOL) 200 MG tablet, Take 200 mg by mouth 5 (five) times daily. , Disp: , Rfl:  .  carbidopa-levodopa (SINEMET IR) 25-100 MG tablet, TAKE 1 TABLET BY MOUTH 4 (FOUR) TIMES DAILY. (Patient taking differently: Take 1 tablet by mouth 3 (three) times daily. ), Disp: 360 tablet, Rfl: 1 .  cholecalciferol (VITAMIN D) 1000 UNITS tablet, Take 2,000 Units by mouth daily. , Disp: , Rfl:  .  desoximetasone (TOPICORT) 0.25 % cream, Apply 1 application topically 2 (two) times daily., Disp: , Rfl:  .  Flaxseed, Linseed, (FLAX SEED OIL) 1300 MG CAPS, Take 1 capsule by mouth daily. , Disp: , Rfl:  .  FLUoxetine (PROZAC) 20 MG tablet, Take 20 mg by mouth daily. , Disp: , Rfl:  .  levothyroxine (LEVOTHROID) 125 MCG tablet, Take 1 tablet (125 mcg total) by mouth daily before breakfast., Disp: 90 tablet, Rfl: 3 .  Multiple Vitamin (MULITIVITAMIN WITH MINERALS) TABS, Take 1 tablet by mouth daily., Disp: , Rfl:  .  Omega-3 Fatty Acids (OMEGA-3 CF PO), Take 1,200 mg by mouth daily. , Disp: , Rfl:  .  pravastatin (PRAVACHOL) 40 MG tablet, Take 1 tablet (40 mg total) by mouth daily., Disp: 90 tablet, Rfl: 3 .  SEROQUEL XR 50 MG TB24 24 hr tablet, Take 50 mg by mouth at bedtime., Disp: , Rfl: 3  EXAM:  Vitals:   06/09/16 1414  BP: (!) 108/58  Pulse: 61    Body mass index is 26.1 kg/m.  GENERAL: vitals reviewed and listed above, alert, oriented, appears well hydrated and in no acute distress  HEENT: atraumatic, conjunttiva clear, no obvious abnormalities on inspection of external nose and ears  NECK: no obvious masses on inspection  LUNGS: clear to auscultation bilaterally, no wheezes, rales or rhonchi, good air movement  CV: HRRR, no peripheral edema  SKIN: few senile purpura  MS: moves all extremities without noticeable abnormality  PSYCH: pleasant and cooperative, no obvious depression or anxiety  ASSESSMENT AND  PLAN:  Discussed the following assessment and plan:  Hypothyroidism, unspecified type - Plan: TSH -check TSH -cont replacement  Renal dysfunction - Plan: Basic metabolic panel -monitor  Sinoatrial node dysfunction (HCC) Pacemaker -stable  Senile purpura (HCC) -stable, discussed bleeding risks with asa and pros/cons -he may consider stopping daily asa  Depressed bipolar I disorder in full remission ([place]) -sees psychiatry for management  COPD mixed type ([place]) -stable  -Patient advised to return or notify a doctor immediately if symptoms worsen or persist or new concerns arise.  Patient Instructions  BEFORE YOU LEAVE: -follow up: Annual exam with [name] and follow up with Dr. Maudie Mercury same day in 3 months -lab  We have ordered labs or studies at this visit. It can take up to 1-2 weeks for results and processing. IF results require follow up or explanation, we will call you with  instructions. Clinically stable results will be released to your [facility]. If you have not heard from [place] or cannot find your results in [facility] in 2 weeks please contact our office at [phone].  If you are not yet signed up for [facility], please consider signing up.            [name] R., DO

## 2016-06-09 NOTE — Therapy (Signed)
[facility] [address] [place], [place], [address] Phone: [phone]   Fax:  [phone]  Patient Details  Name: Victor Osborne MRN: [id number] Date of Birth: January 23, 1932 Referring Provider:  No ref. provider found  Encounter Date: 06/09/2016  PHYSICAL THERAPY DISCHARGE SUMMARY  Visits from Start of Care: 1 (eval only)  Current functional level related to goals / functional outcomes: See eval 01/27/16   Remaining deficits: See eval 01/27/16   Education / Equipment: NA  Plan: Patient agrees to discharge.  Patient goals were not met. Patient is being discharged due to not returning since the last visit.  ?????Pt was unable to return after eval, due to wife having fall and sustaining multiple fractures.  [name] and could not return to therapy.      Damiah Mcdonald W. 06/09/2016, 10:00 PM  [facility] [address] [facility] [address] [place], [place], [address] Phone: [phone]   Fax:  [phone]

## 2016-06-10 ENCOUNTER — Ambulatory Visit: Payer: Medicare Other | Attending: Neurology | Admitting: Physical Therapy

## 2016-06-10 DIAGNOSIS — R293 Abnormal posture: Secondary | ICD-10-CM

## 2016-06-10 DIAGNOSIS — R2689 Other abnormalities of gait and mobility: Secondary | ICD-10-CM | POA: Diagnosis not present

## 2016-06-10 DIAGNOSIS — R2681 Unsteadiness on feet: Secondary | ICD-10-CM | POA: Diagnosis present

## 2016-06-11 NOTE — Therapy (Signed)
[facility] [address] [place], [place], [address] Phone: [phone]   Fax:  [phone]  Physical Therapy Evaluation  Patient Details  Name: Victor Osborne MRN: [id number] Date of Birth: 08/02/1932 Referring Provider: Alonza Bogus, DO  Encounter Date: 06/10/2016      PT End of Session - 06/11/16 1032    Visit Number 1   Number of Visits 9   Date for PT Re-Evaluation 08/09/16   Authorization Type Medicare/[employer]-GCODE every 10th visit   PT Start Time 0804   PT Stop Time 0845   PT Time Calculation (min) 41 min   Activity Tolerance Patient tolerated treatment well   Behavior During Therapy [facility] for tasks assessed/performed      Past Medical History:  Diagnosis Date  . Bipolar 1 disorder ([place])   . COPD (chronic obstructive pulmonary disease) ([place])   . Depression   . First degree AV block   . Hypertension    pt denies but was in medical record  . Hypoglycemia, unspecified   . Myalgia and myositis, unspecified   . Obesity   . Pneumonia 1970  . Skin cancer (melanoma) ([place])   . Thyroid disease   . Unspecified hypothyroidism   . UPJ obstruction, congenital     Past Surgical History:  Procedure Laterality Date  . APPENDECTOMY  1939  . malignant melanoma removed from right forehead  2008  . PERMANENT PACEMAKER INSERTION N/A 06/19/2013   Procedure: PERMANENT PACEMAKER INSERTION;  Surgeon: Evans Lance, MD;  Location: [facility] CATH LAB;  Service: Cardiovascular;  Laterality: N/A;  . right big toe  surgery  2009  . TRANSURETHRAL RESECTION OF PROSTATE  02/04/2011   Procedure: TRANSURETHRAL RESECTION OF THE PROSTATE (TURP);  Surgeon: Dutch Gray, MD;  Location: [facility]S;  Service: Urology;  Laterality: N/A;  . URETHRA SURGERY  2010   reconstruction    There were no vitals filed for this visit.       Subjective Assessment - 06/10/16 0809    Subjective Pt reports balance is the pressing issue for me, as  I have to help wife so much.  I have problem with my balance when I first get up and later in the day when I get tired.  Pt uses cane.  No falls in the past 6 months.   Patient is accompained by: Family member  wife   Pertinent History Parkinson's x at least 3-5 years; bipolar disorder, COPD, pacemaker, HTN   Patient Stated Goals Pt's goals for physical therapy are to maintain mobility as much as possible-walking, balance, backing up.   Currently in Pain? No/denies            [facility] PT Assessment - 06/10/16 2671      Assessment   Medical Diagnosis Parkinson's disease    Referring Provider Wells Guiles Tat, DO   Onset Date/Surgical Date 03/22/16  MD visit     Precautions   Precautions Fall     Balance Screen   Has the patient fallen in the past 6 months No   Has the patient had a decrease in activity level because of a fear of falling?  No   Is the patient reluctant to leave their home because of a fear of falling?  No     Home Environment   Living Environment Private residence   Living Arrangements Spouse/significant other  wife with PD-he is primary caregiver-has housekeeper   Available Help at Discharge --  Has housekeeper,  gardener; pt provides care for wife   Type of [place] to enter   Entrance Stairs-Number of Steps 2   Home Layout Two level   [place] - single point;Grab bars - tub/shower     Prior Function   Level of Independence Independent with basic ADLs;Independent with community mobility with device;Independent with household mobility without device   Leisure Goes out to eat for every meal.  Preparing house to put on market and plans to move to [place] in [place].     Observation/Other Assessments   Focus on Therapeutic Outcomes (FOTO)  NA     Posture/Postural Control   Posture/Postural Control Postural limitations   Postural Limitations Forward head;Rounded Shoulders     ROM / Strength   AROM / PROM / Strength  AROM;Strength     AROM   Overall AROM Comments Lacks full knee extension with A/ROM LAQ-indicates hamstring tightness     Strength   Overall Strength Within functional limits for tasks performed     Transfers   Transfers Sit to Stand;Stand to Sit   Sit to Stand 6: Modified independent (Device/Increase time);Without upper extremity assist;From chair/3-in-1  stands in trunk flexion   Five time sit to stand comments  14.88   Stand to Sit 6: Modified independent (Device/Increase time);Without upper extremity assist;To chair/3-in-1     Ambulation/Gait   Ambulation/Gait Yes   Ambulation/Gait Assistance 6: Modified independent (Device/Increase time)   Ambulation Distance (Feet) 20 Feet   Assistive device Straight cane;None   Gait Pattern Step-through pattern;Decreased arm swing - left;Decreased arm swing - right;Decreased trunk rotation;Wide base of support;Trunk flexed   Ambulation Surface Level;Indoor   Gait velocity 10.57 sec = 3.1 ft/sec     Standardized Balance Assessment   Standardized Balance Assessment Timed Up and Go Test     Timed Up and Go Test   Normal TUG (seconds) 11.5   Manual TUG (seconds) 12.59   Cognitive TUG (seconds) 12.13     High Level Balance   High Level Balance Comments Posterior push and release:  first trial-would fall if unaided; push and release forward-pt takes one large step and regains balance.  Lateral push and release to L-pt would fall if unaided.     Functional Gait  Assessment   Gait assessed  Yes   Gait Level Surface Walks 20 ft in less than 7 sec but greater than 5.5 sec, uses assistive device, slower speed, mild gait deviations, or deviates 6-10 in outside of the 12 in walkway width.  6.63   Change in Gait Speed Able to change speed, demonstrates mild gait deviations, deviates 6-10 in outside of the 12 in walkway width, or no gait deviations, unable to achieve a major change in velocity, or uses a change in velocity, or uses an assistive device.    Gait with Horizontal Head Turns Performs head turns smoothly with slight change in gait velocity (eg, minor disruption to smooth gait path), deviates 6-10 in outside 12 in walkway width, or uses an assistive device.   Gait with Vertical Head Turns Performs task with slight change in gait velocity (eg, minor disruption to smooth gait path), deviates 6 - 10 in outside 12 in walkway width or uses assistive device   Gait and Pivot Turn Pivot turns safely within 3 sec and stops quickly with no loss of balance.   Step Over Obstacle Is able to step over one shoe box (4.5 in total height) without  changing gait speed. No evidence of imbalance.   Gait with Narrow Base of Support Ambulates less than 4 steps heel to toe or cannot perform without assistance.   Gait with Eyes Closed Walks 20 ft, slow speed, abnormal gait pattern, evidence for imbalance, deviates 10-15 in outside 12 in walkway width. Requires more than 9 sec to ambulate 20 ft.  9.03 sec-veers to R   Ambulating Backwards Walks 20 ft, uses assistive device, slower speed, mild gait deviations, deviates 6-10 in outside 12 in walkway width.  16.12   Steps Alternating feet, must use rail.   Total Score 18   FGA comment: Scores <22/30 indicate increased fall risk.                                PT Long Term Goals - 06/11/16 1038      PT LONG TERM GOAL #1   Title Pt will be independent with HEP to address balance, gait, transfers.  TARGET 07/10/16   Time 4   Period Weeks   Status New     PT LONG TERM GOAL #2   Title Pt will improve 5x sit<>stand to less than or equal to 13 seconds for improved efficiency and safety with transfers.   Time 4   Period Weeks   Status New     PT LONG TERM GOAL #3   Title Pt will improve Functional GAit Assessment to at least 22/30 for decreased fall risk.   Time 4   Period Weeks   Status New     PT LONG TERM GOAL #4   Title Pt will ambulate outdoor, unlevel surfaces, at least 1000  ft, independently, no LOB, for improved community gait.   Time 4   Period Weeks   Status New     PT LONG TERM GOAL #5   Title Pt will verbalize understanding of local Parkinson's disease resources.   Time 4   Period Weeks   Status New     PT LONG TERM GOAL #6   Title Pt will verbalize understanding of fall prevention in the home environment.   Time 4   Period Weeks   Status New               Plan - 06/11/16 1033    Clinical Impression Statement Pt is an 83 year old with history of Parkinson's disease, with at least 3 co-morbidities, who presnets to OP PT with reports of concerns of balance.  Pt is primary caregiver for his wife, who has mobility concerns and history of falls.  Pt has not had fall in past 6 months, but did have a fall in the past, trying to assist his wife from falling.  He presents with decreased timing and coordination of gait, abnormal posture, decreased balance, bradykinesia with transfers, postural instability.  Pt would fall if unaided on posterior and lateral  push and release test.  Pt is at fall risk per FGA score of 18/30.  Pt would benefit from skilled PT to address the above stated deficits to improve functional mobility and decrease fall risk.   Rehab Potential Good   PT Frequency 2x / week   PT Duration 4 weeks  plus eval   PT Treatment/Interventions ADLs/Self Care Home Management;Functional mobility training;Gait training;Therapeutic activities;Therapeutic exercise;Balance training;Neuromuscular re-education;Patient/family education   PT Next Visit Plan Initiate HEP to address posture, balance and transfers; try PWR! Moves in standing; step strategies  for balance   Consulted and Agree with Plan of Care Patient      Patient will benefit from skilled therapeutic intervention in order to improve the following deficits and impairments:  Abnormal gait, Decreased balance, Decreased mobility, Difficulty walking, Impaired flexibility, Postural  dysfunction  Visit Diagnosis: Other abnormalities of gait and mobility  Unsteadiness on feet  Abnormal posture      G-Codes - 06-Jul-2016 1040    Functional Assessment Tool Used (Outpatient Only) 5x sit<>stand 14.88 sec, FGA 18/30, push and release test posterior and latera-would lose balance if unaided by PT   Functional Limitation Mobility: Walking and moving around   Mobility: Walking and Moving Around Current Status [phone]) At least 20 percent but less than 40 percent impaired, limited or restricted   Mobility: Walking and Moving Around Goal Status [phone]) At least 1 percent but less than 20 percent impaired, limited or restricted       Problem List Patient Active Problem List   Diagnosis Date Noted  . Pacemaker 09/24/2013  . Sinoatrial node dysfunction ([place]) 06/19/2013  . First degree AV block 04/09/2013  . Allergic rhinitis 03/17/2013  . Depressed bipolar I disorder in full remission ([place]) 12/19/2007  . DYSLIPIDEMIA 12/18/2006  . Obstructive sleep apnea 12/18/2006  . COPD mixed type ([place]) 12/18/2006  . MELANOMA, HX OF 12/18/2006    Meleana Commerford W. 06/11/2016, 10:41 AM Frazier Butt., PT  [facility] [address] [place], [place], [address] Phone: [phone]   Fax:  [phone]  Name: Victor Osborne MRN: [id number] Date of Birth: 09/04/1932

## 2016-06-15 ENCOUNTER — Encounter: Payer: Self-pay | Admitting: Physical Therapy

## 2016-06-15 ENCOUNTER — Ambulatory Visit: Payer: Medicare Other | Admitting: Physical Therapy

## 2016-06-15 DIAGNOSIS — R2681 Unsteadiness on feet: Secondary | ICD-10-CM

## 2016-06-15 DIAGNOSIS — R293 Abnormal posture: Secondary | ICD-10-CM

## 2016-06-15 DIAGNOSIS — R2689 Other abnormalities of gait and mobility: Secondary | ICD-10-CM

## 2016-06-15 NOTE — Therapy (Signed)
[facility] [address] [place], [place], [address] Phone: [phone]   Fax:  [phone]  Physical Therapy Treatment  Patient Details  Name: Victor Osborne MRN: [id number] Date of Birth: December 02, 1932 Referring Provider: Alonza Bogus, DO  Encounter Date: 06/15/2016      PT End of Session - 06/15/16 1059    Visit Number 2   Number of Visits 9   Date for PT Re-Evaluation 08/09/16   Authorization Type Medicare/[employer] every 10th visit   PT Start Time 1022   PT Stop Time 1100   PT Time Calculation (min) 38 min   Equipment Utilized During Treatment Gait belt   Activity Tolerance Patient tolerated treatment well   Behavior During Therapy [facility] for tasks assessed/performed      Past Medical History:  Diagnosis Date  . Bipolar 1 disorder ([place])   . COPD (chronic obstructive pulmonary disease) ([place])   . Depression   . First degree AV block   . Hypertension    pt denies but was in medical record  . Hypoglycemia, unspecified   . Myalgia and myositis, unspecified   . Obesity   . Pneumonia 1970  . Skin cancer (melanoma) ([place])   . Thyroid disease   . Unspecified hypothyroidism   . UPJ obstruction, congenital     Past Surgical History:  Procedure Laterality Date  . APPENDECTOMY  1939  . malignant melanoma removed from right forehead  2008  . PERMANENT PACEMAKER INSERTION N/A 06/19/2013   Procedure: PERMANENT PACEMAKER INSERTION;  Surgeon: Evans Lance, MD;  Location: [facility] CATH LAB;  Service: Cardiovascular;  Laterality: N/A;  . right big toe  surgery  2009  . TRANSURETHRAL RESECTION OF PROSTATE  02/04/2011   Procedure: TRANSURETHRAL RESECTION OF THE PROSTATE (TURP);  Surgeon: Dutch Gray, MD;  Location: [facility] ORS;  Service: Urology;  Laterality: N/A;  . URETHRA SURGERY  2010   reconstruction    There were no vitals filed for this visit.      Subjective Assessment - 06/15/16 1025    Subjective Pt  has SPC but mostly uses the w/c his wife is in for support when walking around.   Patient is accompained by: Family member  wife   Pertinent History Parkinson's x at least 3-5 years; bipolar disorder, COPD, pacemaker, HTN   Patient Stated Goals Pt's goals for physical therapy are to maintain mobility as much as possible-walking, balance, backing up.   Currently in Pain? No/denies                         PWR [facility]) - 06/15/16 1039 STANDING   PWR! Up x20   PWR! Rock [employer] Twist x20   PWR Step x20   Comments cues for technique          Balance Exercises - 06/15/16 1053      Balance Exercises: Standing   Retro Gait Upper extremity support;Other reps (comment)  multiple reps cues for heel clearance.   Marching Limitations intermittent UE support working on initial heel strike.   Heel Raises Limitations heel walk x6, intermittent UE support           PT Education - 06/15/16 1040    Education provided Yes   Education Details HEP PWR! Moves standing.   Person(s) Educated Patient   Methods Explanation;Demonstration;Verbal cues;Handout   Comprehension Verbalized understanding;Returned demonstration;Need further instruction  PT Short Term Goals - 01/27/16 2059      PT SHORT TERM GOAL #1   Title Same as LTGs           PT Long Term Goals - 06/11/16 1038      PT LONG TERM GOAL #1   Title Pt will be independent with HEP to address balance, gait, transfers.  TARGET 07/10/16   Time 4   Period Weeks   Status New     PT LONG TERM GOAL #2   Title Pt will improve 5x sit<>stand to less than or equal to 13 seconds for improved efficiency and safety with transfers.   Time 4   Period Weeks   Status New     PT LONG TERM GOAL #3   Title Pt will improve Functional GAit Assessment to at least 22/30 for decreased fall risk.   Time 4   Period Weeks   Status New     PT LONG TERM GOAL #4   Title Pt will ambulate outdoor, unlevel surfaces, at  least 1000 ft, independently, no LOB, for improved community gait.   Time 4   Period Weeks   Status New     PT LONG TERM GOAL #5   Title Pt will verbalize understanding of local Parkinson's disease resources.   Time 4   Period Weeks   Status New     PT LONG TERM GOAL #6   Title Pt will verbalize understanding of fall prevention in the home environment.   Time 4   Period Weeks   Status New               Plan - 06/15/16 1637    Clinical Impression Statement Initiated HEP for PWR! MOVES in standing; pt requires intermittent UE support and cues for technique. Pt required intermittent UE support for dynamic gait with narrow BOS (heel walk, marching) and retro gait.   Rehab Potential Good   PT Frequency 2x / week   PT Duration 4 weeks  plus eval   PT Treatment/Interventions ADLs/Self Care Home Management;Functional mobility training;Gait training;Therapeutic activities;Therapeutic exercise;Balance training;Neuromuscular re-education;Patient/family education   PT Next Visit Plan Review HEP to address posture, balance and transfers; try PWR! Moves in standing; step strategies for balance   Consulted and Agree with Plan of Care Patient      Patient will benefit from skilled therapeutic intervention in order to improve the following deficits and impairments:  Abnormal gait, Decreased balance, Decreased mobility, Difficulty walking, Impaired flexibility, Postural dysfunction  Visit Diagnosis: Other abnormalities of gait and mobility  Unsteadiness on feet  Abnormal posture     Problem List Patient Active Problem List   Diagnosis Date Noted  . Pacemaker 09/24/2013  . Sinoatrial node dysfunction ([place]) 06/19/2013  . First degree AV block 04/09/2013  . Allergic rhinitis 03/17/2013  . Depressed bipolar I disorder in full remission ([place]) 12/19/2007  . DYSLIPIDEMIA 12/18/2006  . Obstructive sleep apnea 12/18/2006  . COPD mixed type ([place]) 12/18/2006  . MELANOMA, HX OF  12/18/2006    Bjorn Loser, PTA  06/15/16, 4:41 PM [facility] [address] [place], [place], [address] Phone: [phone]   Fax:  [phone]  Name: Victor Osborne MRN: [id number] Date of Birth: 1933-01-04

## 2016-06-16 ENCOUNTER — Ambulatory Visit: Payer: Medicare Other | Admitting: Physical Therapy

## 2016-06-16 ENCOUNTER — Encounter: Payer: Self-pay | Admitting: Physical Therapy

## 2016-06-16 DIAGNOSIS — R2681 Unsteadiness on feet: Secondary | ICD-10-CM

## 2016-06-16 DIAGNOSIS — R2689 Other abnormalities of gait and mobility: Secondary | ICD-10-CM | POA: Diagnosis not present

## 2016-06-16 DIAGNOSIS — R293 Abnormal posture: Secondary | ICD-10-CM

## 2016-06-16 NOTE — Therapy (Signed)
[facility] [address] [place], [place], [address] Phone: [phone]   Fax:  [phone]  Physical Therapy Treatment  Patient Details  Name: Victor Osborne MRN: [id number] Date of Birth: [birth year] Referring Provider: Alonza Bogus, DO  Encounter Date: 06/16/2016      PT End of Session - 06/16/16 1027    Visit Number 3   Number of Visits 9   Date for PT Re-Evaluation 08/09/16   Authorization Type Medicare/[employer] every 10th visit   PT Start Time 0932   PT Stop Time 1010   PT Time Calculation (min) 38 min   Activity Tolerance Patient tolerated treatment well   Behavior During Therapy [facility] for tasks assessed/performed      Past Medical History:  Diagnosis Date  . Bipolar 1 disorder ([place])   . COPD (chronic obstructive pulmonary disease) ([place])   . Depression   . First degree AV block   . Hypertension    pt denies but was in medical record  . Hypoglycemia, unspecified   . Myalgia and myositis, unspecified   . Obesity   . Pneumonia 1970  . Skin cancer (melanoma) ([place])   . Thyroid disease   . Unspecified hypothyroidism   . UPJ obstruction, congenital     Past Surgical History:  Procedure Laterality Date  . APPENDECTOMY  1939  . malignant melanoma removed from right forehead  2008  . PERMANENT PACEMAKER INSERTION N/A 06/19/2013   Procedure: PERMANENT PACEMAKER INSERTION;  Surgeon: Evans Lance, MD;  Location[facility] CATH LAB;  Service: Cardiovascular;  Laterality: N/A;  . right big toe  surgery  2009  . TRANSURETHRAL RESECTION OF PROSTATE  02/04/2011   Procedure: TRANSURETHRAL RESECTION OF THE PROSTATE (TURP);  Surgeon: Dutch Gray, MD;  [facility]S;  Service: Urology;  Laterality: N/A;  . URETHRA SURGERY  2010   reconstruction    There were no vitals filed for this visit.      Subjective Assessment - 06/16/16 0850    Subjective Pt did not get to perform HEP from yesterday.   Patient is accompained by: Family member  wife   Pertinent History Parkinson's x at least 3-5 years; bipolar disorder, COPD, pacemaker, HTN   Patient Stated Goals Pt's goals for physical therapy are to maintain mobility as much as possible-walking, balance, backing up.   Currently in Pain? No/denies                            PWR [facility]) - 06/16/16 0935  STANDING   PWR! Up x20   PWR! Rock [employer] Twist x20   PWR Step x20   Comments cues for technique, to slow down, and find balance between movements  Cues to practice control from motion to stop.          Balance Exercises - 06/16/16 1014      Balance Exercises: Standing   Standing Eyes Opened Narrow base of support (BOS);Head turns;Foam/compliant surface   Tandem Gait Forward;Intermittent upper extremity support;5 reps   Marching Limitations intermittent UE support working on initial heel strike.           PT Education - 06/16/16 1026    Education provided Yes   Education Details Reviewed PWR! Moves in standing and importance of slowing down to work on controlled transitions.   Methods Explanation;Demonstration;Verbal cues;Handout   Comprehension Verbalized understanding;Returned demonstration;Verbal cues required;Need further instruction  PT Short Term Goals - 01/27/16 2059      PT SHORT TERM GOAL #1   Title Same as LTGs           PT Long Term Goals - 06/11/16 1038      PT LONG TERM GOAL #1   Title Pt will be independent with HEP to address balance, gait, transfers.  TARGET 07/10/16   Time 4   Period Weeks   Status New     PT LONG TERM GOAL #2   Title Pt will improve 5x sit<>stand to less than or equal to 13 seconds for improved efficiency and safety with transfers.   Time 4   Period Weeks   Status New     PT LONG TERM GOAL #3   Title Pt will improve Functional GAit Assessment to at least 22/30 for decreased fall risk.   Time 4   Period Weeks   Status New     PT  LONG TERM GOAL #4   Title Pt will ambulate outdoor, unlevel surfaces, at least 1000 ft, independently, no LOB, for improved community gait.   Time 4   Period Weeks   Status New     PT LONG TERM GOAL #5   Title Pt will verbalize understanding of local Parkinson's disease resources.   Time 4   Period Weeks   Status New     PT LONG TERM GOAL #6   Title Pt will verbalize understanding of fall prevention in the home environment.   Time 4   Period Weeks   Status New               Plan - 06/16/16 1028    Clinical Impression Statement Pt continues to require cues for technique for standing PWR! Moves, especially to slow down to control transitional movements.  Progressed balance training on compliant surface; pt required intermittent UE support with narrow BOS.   Rehab Potential Good   PT Frequency 2x / week   PT Duration 4 weeks  plus eval   PT Treatment/Interventions ADLs/Self Care Home Management;Functional mobility training;Gait training;Therapeutic activities;Therapeutic exercise;Balance training;Neuromuscular re-education;Patient/family education   PT Next Visit Plan Review HEP to address posture, balance and transfers; try PWR! Moves in standing; step strategies for balance; compliant surface training.   Consulted and Agree with Plan of Care Patient      Patient will benefit from skilled therapeutic intervention in order to improve the following deficits and impairments:  Abnormal gait, Decreased balance, Decreased mobility, Difficulty walking, Impaired flexibility, Postural dysfunction  Visit Diagnosis: Other abnormalities of gait and mobility  Unsteadiness on feet  Abnormal posture     Problem List Patient Active Problem List   Diagnosis Date Noted  . Pacemaker 09/24/2013  . Sinoatrial node dysfunction ([place]) 06/19/2013  . First degree AV block 04/09/2013  . Allergic rhinitis 03/17/2013  . Depressed bipolar I disorder in full remission ([place]) 12/19/2007  .  DYSLIPIDEMIA 12/18/2006  . Obstructive sleep apnea 12/18/2006  . COPD mixed type ([place]) 12/18/2006  . MELANOMA, HX OF 12/18/2006    Bjorn Loser, PTA  06/16/16, 10:31 AM [facility] [address] [place], [place], [address] Phone: [phone]   Fax:  [phone]  Name: Lyell Clugston MRN: [id number] Date of Birth: 1932/03/27

## 2016-06-20 ENCOUNTER — Encounter: Payer: Self-pay | Admitting: Physical Therapy

## 2016-06-20 ENCOUNTER — Ambulatory Visit: Payer: Medicare Other | Attending: Neurology | Admitting: Physical Therapy

## 2016-06-20 DIAGNOSIS — R2689 Other abnormalities of gait and mobility: Secondary | ICD-10-CM | POA: Insufficient documentation

## 2016-06-20 DIAGNOSIS — R2681 Unsteadiness on feet: Secondary | ICD-10-CM | POA: Diagnosis present

## 2016-06-20 DIAGNOSIS — R293 Abnormal posture: Secondary | ICD-10-CM

## 2016-06-20 NOTE — Therapy (Signed)
[facility] [address] [place], [place], [address] Phone: [phone]   Fax:  [phone]  Physical Therapy Treatment  Patient Details  Name: Victor Osborne MRN: [id number] Date of Birth: September 07, 1932 Referring Provider: Alonza Bogus, DO  Encounter Date: 06/20/2016      PT End of Session - 06/20/16 1536    Visit Number 4   Number of Visits 9   Date for PT Re-Evaluation 08/09/16   Authorization Type Medicare/[employer] every 10th visit   PT Start Time 1450   PT Stop Time 1528   PT Time Calculation (min) 38 min   Activity Tolerance Patient tolerated treatment well   Behavior During Therapy [facility] for tasks assessed/performed      Past Medical History:  Diagnosis Date  . Bipolar 1 disorder ([place])   . COPD (chronic obstructive pulmonary disease) ([place])   . Depression   . First degree AV block   . Hypertension    pt denies but was in medical record  . Hypoglycemia, unspecified   . Myalgia and myositis, unspecified   . Obesity   . Pneumonia 1970  . Skin cancer (melanoma) ([place])   . Thyroid disease   . Unspecified hypothyroidism   . UPJ obstruction, congenital     Past Surgical History:  Procedure Laterality Date  . APPENDECTOMY  1939  . malignant melanoma removed from right forehead  2008  . PERMANENT PACEMAKER INSERTION N/A 06/19/2013   Procedure: PERMANENT PACEMAKER INSERTION;  Surgeon: Evans Lance, MD;  Location: [facility] CATH LAB;  Service: Cardiovascular;  Laterality: N/A;  . right big toe  surgery  2009  . TRANSURETHRAL RESECTION OF PROSTATE  02/04/2011   Procedure: TRANSURETHRAL RESECTION OF THE PROSTATE (TURP);  Surgeon: Dutch Gray, MD;  Location: [facility];  Service: Urology;  Laterality: N/A;  . URETHRA SURGERY  2010   reconstruction    There were no vitals filed for this visit.      Subjective Assessment - 06/20/16 1451    Subjective Performed some of his HEP.   Patient is accompained  by: Family member  wife   Pertinent History Parkinson's x at least 3-5 years; bipolar disorder, COPD, pacemaker, HTN   Patient Stated Goals Pt's goals for physical therapy are to maintain mobility as much as possible-walking, balance, backing up.   Currently in Pain? No/denies                         [facility] Adult PT Treatment/Exercise - 06/20/16 0001      Ambulation/Gait   Ambulation/Gait Yes   Ambulation/Gait Assistance 5: Supervision   Ambulation/Gait Assistance Details dynamic gait with changes in speed, direction, and visual scanning   Ambulation Distance (Feet) 400 Feet   Assistive device None   Gait Pattern Step-through pattern;Decreased arm swing - left;Decreased arm swing - right;Decreased trunk rotation;Wide base of support;Trunk flexed   Ambulation Surface Level;Indoor           PWR [facility]) - 06/20/16 1452 STANDING   PWR! Up x10   PWR! Rock x10   PWR! Twist x10   PWR Step x10   Comments Min cues for technique          Balance Exercises - 06/20/16 1507      Balance Exercises: Standing   Standing Eyes Opened Narrow base of support (BOS);Head turns;Foam/compliant surface  intermittent to no UE support.   Sidestepping Foam/compliant support;Head turns   Sit to  Stand Time 5x2, without UE support, in low surface             PT Short Term Goals - 01/27/16 2059      PT SHORT TERM GOAL #1   Title Same as LTGs           PT Long Term Goals - 06/11/16 1038      PT LONG TERM GOAL #1   Title Pt will be independent with HEP to address balance, gait, transfers.  TARGET 07/10/16   Time 4   Period Weeks   Status New     PT LONG TERM GOAL #2   Title Pt will improve 5x sit<>stand to less than or equal to 13 seconds for improved efficiency and safety with transfers.   Time 4   Period Weeks   Status New     PT LONG TERM GOAL #3   Title Pt will improve Functional GAit Assessment to at least 22/30 for decreased fall risk.   Time 4   Period  Weeks   Status New     PT LONG TERM GOAL #4   Title Pt will ambulate outdoor, unlevel surfaces, at least 1000 ft, independently, no LOB, for improved community gait.   Time 4   Period Weeks   Status New     PT LONG TERM GOAL #5   Title Pt will verbalize understanding of local Parkinson's disease resources.   Time 4   Period Weeks   Status New     PT LONG TERM GOAL #6   Title Pt will verbalize understanding of fall prevention in the home environment.   Time 4   Period Weeks   Status New               Plan - 06/20/16 1537    Clinical Impression Statement Pt is progressing with understanding of PWR! Moves in standing, requiring UE support x1 due to LOB and min cues.  Pt requires intermittent UE support for standing  balance on compliant surface with narrow BOS and cues for balance technique.   Rehab Potential Good   PT Frequency 2x / week   PT Duration 4 weeks  plus eval   PT Treatment/Interventions ADLs/Self Care Home Management;Functional mobility training;Gait training;Therapeutic activities;Therapeutic exercise;Balance training;Neuromuscular re-education;Patient/family education   PT Next Visit Plan Review HEP to address posture, balance and transfers; try PWR! Moves in standing; step strategies for balance; compliant surface training.   Consulted and Agree with Plan of Care Patient      Patient will benefit from skilled therapeutic intervention in order to improve the following deficits and impairments:  Abnormal gait, Decreased balance, Decreased mobility, Difficulty walking, Impaired flexibility, Postural dysfunction  Visit Diagnosis: Other abnormalities of gait and mobility  Unsteadiness on feet  Abnormal posture     Problem List Patient Active Problem List   Diagnosis Date Noted  . Pacemaker 09/24/2013  . Sinoatrial node dysfunction ([place]) 06/19/2013  . First degree AV block 04/09/2013  . Allergic rhinitis 03/17/2013  . Depressed bipolar I disorder in  full remission ([place]) 12/19/2007  . DYSLIPIDEMIA 12/18/2006  . Obstructive sleep apnea 12/18/2006  . COPD mixed type ([place]) 12/18/2006  . MELANOMA, HX OF 12/18/2006    Bjorn Loser, PTA  06/20/16, 3:46 PM [facility] [address] [place], [place], [address] Phone: [phone]   Fax:  [phone]  Name: Victor Osborne MRN: [id number] Date of Birth: [birth year]

## 2016-06-24 ENCOUNTER — Encounter: Payer: Self-pay | Admitting: Physical Therapy

## 2016-06-24 ENCOUNTER — Ambulatory Visit: Payer: Medicare Other | Admitting: Physical Therapy

## 2016-06-24 DIAGNOSIS — R2689 Other abnormalities of gait and mobility: Secondary | ICD-10-CM | POA: Diagnosis not present

## 2016-06-24 DIAGNOSIS — R293 Abnormal posture: Secondary | ICD-10-CM

## 2016-06-24 DIAGNOSIS — R2681 Unsteadiness on feet: Secondary | ICD-10-CM

## 2016-06-24 NOTE — Therapy (Signed)
[facility] [address] [place], [place], [address] Phone: [phone]   Fax:  [phone]  Physical Therapy Treatment  Patient Details  Name: Victor Osborne MRN: [id number] Date of Birth: September 24, 1932 Referring Provider: Alonza Bogus, DO  Encounter Date: 06/24/2016      PT End of Session - 06/24/16 0957    Visit Number 5   Number of Visits 9   Date for PT Re-Evaluation 08/09/16   Authorization Type Medicare/[employer] every 10th visit   PT Start Time 0902  arrived late   PT Stop Time 0932   PT Time Calculation (min) 30 min   Activity Tolerance Patient tolerated treatment well   Behavior During Therapy [facility] for tasks assessed/performed      Past Medical History:  Diagnosis Date  . Bipolar 1 disorder ([place])   . COPD (chronic obstructive pulmonary disease) ([place])   . Depression   . First degree AV block   . Hypertension    pt denies but was in medical record  . Hypoglycemia, unspecified   . Myalgia and myositis, unspecified   . Obesity   . Pneumonia 1970  . Skin cancer (melanoma) ([place])   . Thyroid disease   . Unspecified hypothyroidism   . UPJ obstruction, congenital     Past Surgical History:  Procedure Laterality Date  . APPENDECTOMY  1939  . malignant melanoma removed from right forehead  2008  . PERMANENT PACEMAKER INSERTION N/A 06/19/2013   Procedure: PERMANENT PACEMAKER INSERTION;  Surgeon: Evans Lance, MD;  Location: [facility] CATH LAB;  Service: Cardiovascular;  Laterality: N/A;  . right big toe  surgery  2009  . TRANSURETHRAL RESECTION OF PROSTATE  02/04/2011   Procedure: TRANSURETHRAL RESECTION OF THE PROSTATE (TURP);  Surgeon: Dutch Gray, MD;  [facility]: [facility]S;  Service: Urology;  Laterality: N/A;  . URETHRA SURGERY  2010   reconstruction    There were no vitals filed for this visit.      Subjective Assessment - 06/24/16 0903    Subjective Reports wife had seizures last night and  he didn't get much rest; got a late start this morning.  Arrived late.   Patient is accompained by: Family member   Pertinent History Parkinson's x at least 3-5 years; bipolar disorder, COPD, pacemaker, HTN   Patient Stated Goals Pt's goals for physical therapy are to maintain mobility as much as possible-walking, balance, backing up.   Currently in Pain? No/denies                            PWR [facility]) - 06/24/16 5427    PWR! Up x20   PWR! Rock [employer] Twist x20   PWR Step x20   Comments standing on compliant red foam with min verbal, visual and tactile cues for full weight shifting and correct technique and bigger movements          Balance Exercises - 06/24/16 0931      Balance Exercises: Standing   Rockerboard Anterior/posterior;Lateral;EO;UE support;10 reps  incorporating standing PWR! moves with min A             PT Short Term Goals - 01/27/16 2059      PT SHORT TERM GOAL #1   Title Same as LTGs           PT Long Term Goals - 06/11/16 1038      PT LONG TERM GOAL #  1   Title Pt will be independent with HEP to address balance, gait, transfers.  TARGET 07/10/16   Time 4   Period Weeks   Status New     PT LONG TERM GOAL #2   Title Pt will improve 5x sit<>stand to less than or equal to 13 seconds for improved efficiency and safety with transfers.   Time 4   Period Weeks   Status New     PT LONG TERM GOAL #3   Title Pt will improve Functional GAit Assessment to at least 22/30 for decreased fall risk.   Time 4   Period Weeks   Status New     PT LONG TERM GOAL #4   Title Pt will ambulate outdoor, unlevel surfaces, at least 1000 ft, independently, no LOB, for improved community gait.   Time 4   Period Weeks   Status New     PT LONG TERM GOAL #5   Title Pt will verbalize understanding of local Parkinson's disease resources.   Time 4   Period Weeks   Status New     PT LONG TERM GOAL #6   Title Pt will verbalize understanding of  fall prevention in the home environment.   Time 4   Period Weeks   Status New               Plan - 06/24/16 3267    Clinical Impression Statement Treatment session today focused on progression of standing PWR! moves to incorporate more compliant surfaces (mat on floor, rockerboard) to facilitate increased balance reactions and weight shifting.  Pt tolerated well and enjoyed the challenge but required constant min A for balance and facilitation of weight shifting.     Rehab Potential Good   PT Frequency 2x / week   PT Duration 4 weeks   PT Treatment/Interventions ADLs/Self Care Home Management;Functional mobility training;Gait training;Therapeutic activities;Therapeutic exercise;Balance training;Neuromuscular re-education;Patient/family education   PT Next Visit Plan PWR on compliant surfaces, stepping and balance strategies, gait with increased foot clearance/arm swing   Consulted and Agree with Plan of Care Patient      Patient will benefit from skilled therapeutic intervention in order to improve the following deficits and impairments:  Abnormal gait, Decreased balance, Decreased mobility, Difficulty walking, Impaired flexibility, Postural dysfunction  Visit Diagnosis: Other abnormalities of gait and mobility  Unsteadiness on feet  Abnormal posture     Problem List Patient Active Problem List   Diagnosis Date Noted  . Pacemaker 09/24/2013  . Sinoatrial node dysfunction ([place]) 06/19/2013  . First degree AV block 04/09/2013  . Allergic rhinitis 03/17/2013  . Depressed bipolar I disorder in full remission ([place]) 12/19/2007  . DYSLIPIDEMIA 12/18/2006  . Obstructive sleep apnea 12/18/2006  . COPD mixed type ([place]) 12/18/2006  . MELANOMA, HX OF 12/18/2006    Raylene Everts, PT, DPT 06/24/16    10:11 AM    [facility] [address] [place], [place], [address] Phone: [phone]   Fax:  [phone]  Name:  Victor Osborne MRN: [id number] Date of Birth: 10-04-1932

## 2016-06-28 ENCOUNTER — Ambulatory Visit: Payer: Medicare Other | Admitting: Physical Therapy

## 2016-06-28 DIAGNOSIS — R2681 Unsteadiness on feet: Secondary | ICD-10-CM

## 2016-06-28 DIAGNOSIS — R2689 Other abnormalities of gait and mobility: Secondary | ICD-10-CM | POA: Diagnosis not present

## 2016-06-28 NOTE — Patient Instructions (Signed)
Provided PWR! Moves handout for forward step and weightshift x 10 reps  Back step and weightshift x 10 reps  Alternating legs, once per day

## 2016-06-28 NOTE — Therapy (Signed)
[facility] [address] [place], [place], [address] Phone: [phone]   Fax:  [phone]  Physical Therapy Treatment  Patient Details  Name: Victor Osborne MRN: [id number] Date of Birth: 1932-06-27 Referring Provider: Alonza Bogus, DO  Encounter Date: 06/28/2016      PT End of Session - 06/28/16 1858    Visit Number 6   Number of Visits 9   Date for PT Re-Evaluation 08/09/16   Authorization Type Medicare/[employer] every 10th visit   PT Start Time 1455   PT Stop Time 1533   PT Time Calculation (min) 38 min   Activity Tolerance Patient tolerated treatment well   Behavior During Therapy [facility] for tasks assessed/performed      Past Medical History:  Diagnosis Date  . Bipolar 1 disorder ([place])   . COPD (chronic obstructive pulmonary disease) ([place])   . Depression   . First degree AV block   . Hypertension    pt denies but was in medical record  . Hypoglycemia, unspecified   . Myalgia and myositis, unspecified   . Obesity   . Pneumonia 1970  . Skin cancer (melanoma) ([place])   . Thyroid disease   . Unspecified hypothyroidism   . UPJ obstruction, congenital     Past Surgical History:  Procedure Laterality Date  . APPENDECTOMY  1939  . malignant melanoma removed from right forehead  2008  . PERMANENT PACEMAKER INSERTION N/A 06/19/2013   Procedure: PERMANENT PACEMAKER INSERTION;  Surgeon: Evans Lance, MD;  Location: [facility] CATH LAB;  Service: Cardiovascular;  Laterality: N/A;  . right big toe  surgery  2009  . TRANSURETHRAL RESECTION OF PROSTATE  02/04/2011   Procedure: TRANSURETHRAL RESECTION OF THE PROSTATE (TURP);  Surgeon: Dutch Gray, MD;  Location: [facility] ORS;  Service: Urology;  Laterality: N/A;  . URETHRA SURGERY  2010   reconstruction    There were no vitals filed for this visit.      Subjective Assessment - 06/28/16 1458    Subjective Feel much weaker than last week.  Just have had so  much physical activity since last Thursday.  I feel I've been using my cane more around the house.   Patient is accompained by: Family member   Pertinent History Parkinson's x at least 3-5 years; bipolar disorder, COPD, pacemaker, HTN   Patient Stated Goals Pt's goals for physical therapy are to maintain mobility as much as possible-walking, balance, backing up.   Currently in Pain? No/denies                         [facility] Adult PT Treatment/Exercise - 06/28/16 0001      Transfers   Transfers Sit to Stand;Stand to Sit   Sit to Stand 6: Modified independent (Device/Increase time);Without upper extremity assist;From bed   Stand to Sit 6: Modified independent (Device/Increase time);Without upper extremity assist;To bed   Number of Reps 10 reps    Cues for increased forward lean, for upright posture upon standing         Balance Exercises - 06/28/16 1851      Balance Exercises: Standing   Standing Eyes Opened Wide (BOA);Foam/compliant surface;5 reps;Head turns  Head nods x 5 reps   Stepping Strategy Anterior;Posterior;Lateral;10 reps  Alternating legs, cues for deliberate movement   Rockerboard Anterior/posterior;Lateral;EO;UE support;10 reps  incorporating arm swing, head turns, head nods   Balance Beam At counter:  lateral weigthshifting x 10 reps, anterior/posterior  weightshifting x 10 reps, marching in place x 10 reps, alternating lateral step and weightshift x 10 reps with intermittent UE support   Retro Gait Upper extremity support;Other reps (comment)  parallel bars, counter, cues for foot clearance   Marching Limitations Marching forward/back in parallel bars x 3 reps   Other Standing Exercises Standing on rockerboard ant/post with alternating forward step taps x 10, then alternating back step taps x 10           PT Education - 06/28/16 1857    Education provided Yes   Education Details forward/back step and weightshift   Person(s) Educated Patient    Methods Explanation;Demonstration;Handout   Comprehension Verbalized understanding;Returned demonstration          PT Short Term Goals - 01/27/16 2059      PT SHORT TERM GOAL #1   Title Same as LTGs           PT Long Term Goals - 06/11/16 1038      PT LONG TERM GOAL #1   Title Pt will be independent with HEP to address balance, gait, transfers.  TARGET 07/10/16   Time 4   Period Weeks   Status New     PT LONG TERM GOAL #2   Title Pt will improve 5x sit<>stand to less than or equal to 13 seconds for improved efficiency and safety with transfers.   Time 4   Period Weeks   Status New     PT LONG TERM GOAL #3   Title Pt will improve Functional GAit Assessment to at least 22/30 for decreased fall risk.   Time 4   Period Weeks   Status New     PT LONG TERM GOAL #4   Title Pt will ambulate outdoor, unlevel surfaces, at least 1000 ft, independently, no LOB, for improved community gait.   Time 4   Period Weeks   Status New     PT LONG TERM GOAL #5   Title Pt will verbalize understanding of local Parkinson's disease resources.   Time 4   Period Weeks   Status New     PT LONG TERM GOAL #6   Title Pt will verbalize understanding of fall prevention in the home environment.   Time 4   Period Weeks   Status New               Plan - 06/28/16 1858    Clinical Impression Statement Continued to work on compliant surfaces as well as step strategies and weightshifting for balance.  Pt continues to need cues for steady transition and safety in posterior directions.   Rehab Potential Good   PT Frequency 2x / week   PT Duration 4 weeks   PT Treatment/Interventions ADLs/Self Care Home Management;Functional mobility training;Gait training;Therapeutic activities;Therapeutic exercise;Balance training;Neuromuscular re-education;Patient/family education   PT Next Visit Plan continue stepping and balance strategies, gait with increased foot clearance/arm swing   Consulted and  Agree with Plan of Care Patient      Patient will benefit from skilled therapeutic intervention in order to improve the following deficits and impairments:  Abnormal gait, Decreased balance, Decreased mobility, Difficulty walking, Impaired flexibility, Postural dysfunction  Visit Diagnosis: Unsteadiness on feet     Problem List Patient Active Problem List   Diagnosis Date Noted  . Pacemaker 09/24/2013  . Sinoatrial node dysfunction ([place]) 06/19/2013  . First degree AV block 04/09/2013  . Allergic rhinitis 03/17/2013  . Depressed bipolar I disorder in full remission (  [place]) 12/19/2007  . DYSLIPIDEMIA 12/18/2006  . Obstructive sleep apnea 12/18/2006  . COPD mixed type ([place]) 12/18/2006  . MELANOMA, HX OF 12/18/2006    Ronzell Laban W. 06/28/2016, 7:00 PM  [facility] [address] [place], [place], [address] Phone: [phone]   Fax:  [phone]  Name: Victor Osborne MRN: [id number] Date of Birth: January 03, 1933

## 2016-06-30 ENCOUNTER — Ambulatory Visit: Payer: Medicare Other | Admitting: Physical Therapy

## 2016-06-30 DIAGNOSIS — R2689 Other abnormalities of gait and mobility: Secondary | ICD-10-CM

## 2016-06-30 DIAGNOSIS — R2681 Unsteadiness on feet: Secondary | ICD-10-CM

## 2016-06-30 NOTE — Therapy (Signed)
[facility] [address] [place], [place], [address] Phone: [phone]   Fax:  [phone]  Physical Therapy Treatment  Patient Details  Name: Victor Osborne MRN: [id number] Date of Birth: Aug 31, 1932 Referring Provider: Alonza Bogus, DO  Encounter Date: 06/30/2016      PT End of Session - 06/30/16 1637    Visit Number 7   Number of Visits 9   Date for PT Re-Evaluation 08/09/16   Authorization Type Medicare/[employer] every 10th visit   PT Start Time 1532   PT Stop Time 1614   PT Time Calculation (min) 42 min   Activity Tolerance Patient tolerated treatment well   Behavior During Therapy [facility] for tasks assessed/performed      Past Medical History:  Diagnosis Date  . Bipolar 1 disorder ([place])   . COPD (chronic obstructive pulmonary disease) ([place])   . Depression   . First degree AV block   . Hypertension    pt denies but was in medical record  . Hypoglycemia, unspecified   . Myalgia and myositis, unspecified   . Obesity   . Pneumonia 1970  . Skin cancer (melanoma) ([place])   . Thyroid disease   . Unspecified hypothyroidism   . UPJ obstruction, congenital     Past Surgical History:  Procedure Laterality Date  . APPENDECTOMY  1939  . malignant melanoma removed from right forehead  2008  . PERMANENT PACEMAKER INSERTION N/A 06/19/2013   Procedure: PERMANENT PACEMAKER INSERTION;  Surgeon: Evans Lance, MD;  Location: [facility] CATH LAB;  Service: Cardiovascular;  Laterality: N/A;  . right big toe  surgery  2009  . TRANSURETHRAL RESECTION OF PROSTATE  02/04/2011   Procedure: TRANSURETHRAL RESECTION OF THE PROSTATE (TURP);  Surgeon: Dutch Gray, MD;  Location: [facility]S;  Service: Urology;  Laterality: N/A;  . URETHRA SURGERY  2010   reconstruction    There were no vitals filed for this visit.      Subjective Assessment - 06/30/16 1533    Subjective Feel better strength-wise today than last visit.   Patient is accompained by: Family member   Pertinent History Parkinson's x at least 3-5 years; bipolar disorder, COPD, pacemaker, HTN   Patient Stated Goals Pt's goals for physical therapy are to maintain mobility as much as possible-walking, balance, backing up.   Currently in Pain? No/denies                         [facility] Adult PT Treatment/Exercise - 06/30/16 0001      Ambulation/Gait   Ambulation/Gait Yes   Ambulation/Gait Assistance 5: Supervision   Ambulation/Gait Assistance Details Dynamic gait activities in gym area, with environmental scanning tasks, quick start/stops, backwards gait, then quick turns for changes of directions.  Pt does not have LOB, but noted to have decreased foot clearance with head turns with gait.   Ambulation Distance (Feet) 600 Feet  then 200   Assistive device None   Gait Pattern Step-through pattern;Decreased arm swing - left;Decreased arm swing - right;Decreased trunk rotation;Wide base of support;Trunk flexed   Ambulation Surface Level;Indoor             Balance Exercises - 06/30/16 1623      Balance Exercises: Standing   Standing Eyes Opened Wide (BOA);Foam/compliant surface;5 reps;Head turns;Narrow base of support (BOS)  Head nods x 5 reps   Standing Eyes Closed Narrow base of support (BOS);Wide (BOA);Foam/compliant surface;1 rep;10 secs   Stepping  Strategy Anterior;Posterior;Lateral;10 reps  2 sets with cues for deliberate step and recovery   Rockerboard Anterior/posterior;Head turns;EO;UE support  Hip/Ankle strategy work on Best boy in place x 15 reps, alternating hip kicks x 15 reps, alternating step taps x 15 reps, then forward step forward>back step and weightshift with bilateral UE support   Sidestepping Foam/compliant support;Upper extremity support;3 reps  along balance beam in parallel bars    On rockerboard-also performed head nods x 5 reps, then UE alternating lifts x 10 reps, then  bilateral UE lifts while balancing on rockerboard         PT Short Term Goals - 01/27/16 2059      PT SHORT TERM GOAL #1   Title Same as LTGs           PT Long Term Goals - 06/30/16 1602      PT LONG TERM GOAL #1   Title Pt will be independent with HEP to address balance, gait, transfers.  TARGET 07/10/16   Time 4   Period Weeks   Status New     PT LONG TERM GOAL #2   Title Pt will improve 5x sit<>stand to less than or equal to 13 seconds for improved efficiency and safety with transfers.   Time 4   Period Weeks   Status New     PT LONG TERM GOAL #3   Title Pt will improve Functional GAit Assessment to at least 22/30 for decreased fall risk.   Time 4   Period Weeks   Status New     PT LONG TERM GOAL #4   Title Pt will ambulate outdoor, unlevel surfaces, at least 1000 ft, independently, no LOB, for improved community gait.   Time 4   Period Weeks   Status New     PT LONG TERM GOAL #5   Title Pt will verbalize understanding of local Parkinson's disease resources.   Time 4   Period Weeks   Status New     PT LONG TERM GOAL #6   Title Pt will verbalize understanding of fall prevention in the home environment.   Time 4   Period Weeks   Status New               Plan - 06/30/16 1637    Clinical Impression Statement Continued to work on compliant surface work and dynamic gait activities.  With environmental scanning tasks, pt tends to have decreased foot clearance with gait.  Pt will continue to benefit from skilled PT to address balance and gait.   Rehab Potential Good   PT Frequency 2x / week   PT Duration 4 weeks   PT Treatment/Interventions ADLs/Self Care Home Management;Functional mobility training;Gait training;Therapeutic activities;Therapeutic exercise;Balance training;Neuromuscular re-education;Patient/family education   PT Next Visit Plan Work on gait with arm swing/foot clearance, dual tasking with gait; begin checking goals and discuss ?d/c    Consulted and Agree with Plan of Care Patient      Patient will benefit from skilled therapeutic intervention in order to improve the following deficits and impairments:  Abnormal gait, Decreased balance, Decreased mobility, Difficulty walking, Impaired flexibility, Postural dysfunction  Visit Diagnosis: Unsteadiness on feet  Other abnormalities of gait and mobility     Problem List Patient Active Problem List   Diagnosis Date Noted  . Pacemaker 09/24/2013  . Sinoatrial node dysfunction ([place]) 06/19/2013  . First degree AV block 04/09/2013  . Allergic rhinitis 03/17/2013  . Depressed bipolar I disorder  in full remission ([place]) 12/19/2007  . DYSLIPIDEMIA 12/18/2006  . Obstructive sleep apnea 12/18/2006  . COPD mixed type ([place]) 12/18/2006  . MELANOMA, HX OF 12/18/2006    Keidra Withers W. 06/30/2016, 4:42 PM Frazier Butt., PT  [facility] [address] [place], [place], [address] Phone: [phone]   Fax:  [phone]  Name: Victor Osborne MRN: [id number] Date of Birth: 08-26-1932

## 2016-07-05 ENCOUNTER — Encounter: Payer: Self-pay | Admitting: Physical Therapy

## 2016-07-05 ENCOUNTER — Ambulatory Visit: Payer: Medicare Other | Admitting: Physical Therapy

## 2016-07-05 DIAGNOSIS — R2681 Unsteadiness on feet: Secondary | ICD-10-CM

## 2016-07-05 DIAGNOSIS — R2689 Other abnormalities of gait and mobility: Secondary | ICD-10-CM | POA: Diagnosis not present

## 2016-07-05 NOTE — Patient Instructions (Addendum)
Fall Prevention in the Home Falls can cause injuries and can affect people from all age groups. There are many simple things that you can do to make your home safe and to help prevent falls. What can I do on the outside of my home?  Regularly repair the edges of walkways and driveways and fix any cracks.  Remove high doorway thresholds.  Trim any shrubbery on the main path into your home.  Use bright outdoor lighting.  Clear walkways of debris and clutter, including tools and rocks.  Regularly check that handrails are securely fastened and in good repair. Both sides of any steps should have handrails.  Install guardrails along the edges of any raised decks or porches.  Have leaves, snow, and ice cleared regularly.  Use sand or salt on walkways during winter months.  In the garage, clean up any spills right away, including grease or oil spills. What can I do in the bathroom?  Use night lights.  Install grab bars by the toilet and in the tub and shower. Do not use towel bars as grab bars.  Use non-skid mats or decals on the floor of the tub or shower.  If you need to sit down while you are in the shower, use a plastic, non-slip stool.  Keep the floor dry. Immediately clean up any water that spills on the floor.  Remove soap buildup in the tub or shower on a regular basis.  Attach bath mats securely with double-sided non-slip rug tape.  Remove throw rugs and other tripping hazards from the floor. What can I do in the bedroom?  Use night lights.  Make sure that a bedside light is easy to reach.  Do not use oversized bedding that drapes onto the floor.  Have a firm chair that has side arms to use for getting dressed.  Remove throw rugs and other tripping hazards from the floor. What can I do in the kitchen?  Clean up any spills right away.  Avoid walking on wet floors.  Place frequently used items in easy-to-reach places.  If you need to reach for something above  you, use a sturdy step stool that has a grab bar.  Keep electrical cables out of the way.  Do not use floor polish or wax that makes floors slippery. If you have to use wax, make sure that it is non-skid floor wax.  Remove throw rugs and other tripping hazards from the floor. What can I do in the stairways?  Do not leave any items on the stairs.  Make sure that there are handrails on both sides of the stairs. Fix handrails that are broken or loose. Make sure that handrails are as long as the stairways.  Check any carpeting to make sure that it is firmly attached to the stairs. Fix any carpet that is loose or worn.  Avoid having throw rugs at the top or bottom of stairways, or secure the rugs with carpet tape to prevent them from moving.  Make sure that you have a light switch at the top of the stairs and the bottom of the stairs. If you do not have them, have them installed. What are some other fall prevention tips?  Wear closed-toe shoes that fit well and support your feet. Wear shoes that have rubber soles or low heels.  When you use a stepladder, make sure that it is completely opened and that the sides are firmly locked. Have someone hold the ladder while you are using   it. Do not climb a closed stepladder.  Add color or contrast paint or tape to grab bars and handrails in your home. Place contrasting color strips on the first and last steps.  Use mobility aids as needed, such as canes, walkers, scooters, and crutches.  Turn on lights if it is dark. Replace any light bulbs that burn out.  Set up furniture so that there are clear paths. Keep the furniture in the same spot.  Fix any uneven floor surfaces.  Choose a carpet design that does not hide the edge of steps of a stairway.  Be aware of any and all pets.  Review your medicines with your healthcare provider. Some medicines can cause dizziness or changes in blood pressure, which increase your risk of falling. Talk with  your health care provider about other ways that you can decrease your risk of falls. This may include working with a physical therapist or trainer to improve your strength, balance, and endurance. This information is not intended to replace advice given to you by your health care provider. Make sure you discuss any questions you have with your health care provider. Document Released: 12/24/2001 Document Revised: 06/02/2015 Document Reviewed: 02/07/2014 [employer] Interactive Patient Education  2017 [employer]  

## 2016-07-05 NOTE — Therapy (Signed)
[facility] [address] [place], [place], [address] Phone: [phone]   Fax:  [phone]  Physical Therapy Treatment  Patient Details  Name: Victor Osborne MRN: [id number] Date of Birth: 07/20/1932 Referring Provider: Alonza Bogus, DO  Encounter Date: 07/05/2016      PT End of Session - 07/05/16 2008    Visit Number 8   Number of Visits 9   Date for PT Re-Evaluation 08/09/16   Authorization Type Medicare/[employer]-G[employer] every 10th visit   PT Start Time 1315   PT Stop Time 1400   PT Time Calculation (min) 45 min   Activity Tolerance Patient tolerated treatment well   Behavior During Therapy [facility] for tasks assessed/performed      Past Medical History:  Diagnosis Date  . Bipolar 1 disorder ([place])   . COPD (chronic obstructive pulmonary disease) ([place])   . Depression   . First degree AV block   . Hypertension    pt denies but was in medical record  . Hypoglycemia, unspecified   . Myalgia and myositis, unspecified   . Obesity   . Pneumonia 1970  . Skin cancer (melanoma) ([place])   . Thyroid disease   . Unspecified hypothyroidism   . UPJ obstruction, congenital     Past Surgical History:  Procedure Laterality Date  . APPENDECTOMY  1939  . malignant melanoma removed from right forehead  2008  . PERMANENT PACEMAKER INSERTION N/A 06/19/2013   Procedure: PERMANENT PACEMAKER INSERTION;  Surgeon: Evans Lance, MD;  [facility]: [facility] C[facility] LAB;  Service: Cardiovascular;  Laterality: N/A;  . right big toe  surgery  2009  . TRANSURETHRAL RESECTION OF PROSTATE  02/04/2011   Procedure: TRANSURETHRAL RESECTION OF THE PROSTATE (TURP);  Surgeon: Dutch Gray, MD;  Location: [facility] ORS;  Service: Urology;  Laterality: N/A;  . URETHRA SURGERY  2010   reconstruction    There were no vitals filed for this visit.      Subjective Assessment - 07/05/16 1315    Subjective little pain in the knees over the weekend; gone now;  reports his balance is the worst between 9 pm and 10:30 pm--possibility that he could fall but he hasn't because he is really careful. Concerned that his wife would be d/c'd from PT if he was.    Patient is accompained by: Family member   Pertinent History Parkinson's x at least 3-5 years; bipolar disorder, COPD, pacemaker, HTN   Patient Stated Goals Pt's goals for physical therapy are to maintain mobility as much as possible-walking, balance, backing up.   Currently in Pain? No/denies            [facility] PT Assessment - 07/05/16 1331      Functional Gait  Assessment   Gait Level Surface Walks 20 ft in less than 5.5 sec, no assistive devices, good speed, no evidence for imbalance, normal gait pattern, deviates no more than 6 in outside of the 12 in walkway width.   Change in Gait Speed Able to smoothly change walking speed without loss of balance or gait deviation. Deviate no more than 6 in outside of the 12 in walkway width.   Gait with Horizontal Head Turns Performs head turns smoothly with slight change in gait velocity (eg, minor disruption to smooth gait path), deviates 6-10 in outside 12 in walkway width, or uses an assistive device.   Gait with Vertical Head Turns Performs task with slight change in gait velocity (eg, minor disruption to  smooth gait path), deviates 6 - 10 in outside 12 in walkway width or uses assistive device   Gait and Pivot Turn Pivot turns safely within 3 sec and stops quickly with no loss of balance.   Step Over Obstacle Is able to step over 2 stacked shoe boxes taped together (9 in total height) without changing gait speed. No evidence of imbalance.   Gait with Narrow Base of Support Ambulates 4-7 steps.   Gait with Eyes Closed Walks 20 ft, slow speed, abnormal gait pattern, evidence for imbalance, deviates 10-15 in outside 12 in walkway width. Requires more than 9 sec to ambulate 20 ft.   Ambulating Backwards Walks 20 ft, uses assistive device, slower speed, mild  gait deviations, deviates 6-10 in outside 12 in walkway width.   Steps Alternating feet, must use rail.   Total Score 22                     [facility] Adult PT Treatment/Exercise - 07/05/16 1331      Transfers   Five time sit to stand comments  14.53     Ambulation/Gait   Ambulation/Gait Assistance 6: Modified independent (Device/Increase time)   Ambulation Distance (Feet) 240 Feet  50, 120   Assistive device None   Gait Pattern Step-through pattern;Decreased arm swing - left;Decreased arm swing - right;Decreased trunk rotation;Wide base of support;Trunk flexed   Ambulation Surface Level;Indoor  did not go outside due to extreme heat (appt at 13:15)           [facility]) - 07/05/16 2005    PWR! exercises Moves in standing   PWR! Rock x 20   Comments standing in corner with rotation for each arm to touch the opposite wall for trunk rotaiton          Balance Exercises - 07/05/16 2000      Balance Exercises: Standing   Standing Eyes Opened Foam/compliant surface;Narrow base of support (BOS);Head turns   Standing Eyes Closed Wide (BOA);Foam/compliant surface   Tandem Stance Eyes open;Intermittent upper extremity support;10 secs  bil   SLS Eyes open;Upper extremity support 1;Intermittent upper extremity support;10 secs   Rockerboard Anterior/posterior;Head turns;EO   Retro Gait Upper extremity support  // bars 2 lengths           PT Education - 07/05/16 2007    Education provided Yes   Education Details potential d/c from PT next visit (will complete checking goals); wife would continue to receive PT   Person(s) Educated Patient   Methods Explanation   Comprehension Verbalized understanding          PT Short Term Goals - 01/27/16 2059      PT SHORT TERM GOAL #1   Title Same as LTGs           PT Long Term Goals - 07/05/16 2011      PT LONG TERM GOAL #1   Title Pt will be independent with HEP to address balance, gait, transfers.  TARGET  07/10/16   Time 4   Period Weeks   Status New     PT LONG TERM GOAL #2   Title Pt will improve 5x sit<>stand to less than or equal to 13 seconds for improved efficiency and safety with transfers.   Baseline 6/19  14.53 sec   Time 4   Period Weeks   Status Partially Met     PT LONG TERM GOAL #3   Title Pt will improve Functional GAit  Assessment to at least 22/30 for decreased fall risk.   Baseline 6/19  22/30   Time 4   Period Weeks   Status Achieved     PT LONG TERM GOAL #4   Title Pt will ambulate outdoor, unlevel surfaces, at least 1000 ft, independently, no LOB, for improved community gait.   Time 4   Period Weeks   Status New     PT LONG TERM GOAL #5   Title Pt will verbalize understanding of local Parkinson's disease resources.   Time 4   Period Weeks   Status New     PT LONG TERM GOAL #6   Title Pt will verbalize understanding of fall prevention in the home environment.   Time 4   Period Weeks   Status Achieved               Plan - 07/05/16 2008    Clinical Impression Statement Skilled session focused on gait and balance training. also began to check progress towards LTGs, with plan to complete next visit with likely discharge from PT. Patient met 2 of 3 LTGs assessed and partially met 1 goal (improved but not to goal level).    Rehab Potential Good   PT Frequency 2x / week   PT Duration 4 weeks   PT Treatment/Interventions ADLs/Self Care Home Management;Functional mobility training;Gait training;Therapeutic activities;Therapeutic exercise;Balance training;Neuromuscular re-education;Patient/family education   PT Next Visit Plan checking goals and discuss ?d/c;   Work on gait with arm swing/foot clearance, dual tasking with gait;    Consulted and Agree with Plan of Care Patient      Patient will benefit from skilled therapeutic intervention in order to improve the following deficits and impairments:  Abnormal gait, Decreased balance, Decreased mobility,  Difficulty walking, Impaired flexibility, Postural dysfunction  Visit Diagnosis: Unsteadiness on feet  Other abnormalities of gait and mobility     Problem List Patient Active Problem List   Diagnosis Date Noted  . Pacemaker 09/24/2013  . Sinoatrial node dysfunction ([place]) 06/19/2013  . First degree AV block 04/09/2013  . Allergic rhinitis 03/17/2013  . Depressed bipolar I disorder in full remission ([place]) 12/19/2007  . DYSLIPIDEMIA 12/18/2006  . Obstructive sleep apnea 12/18/2006  . COPD mixed type ([place]) 12/18/2006  . MELANOMA, HX OF 12/18/2006    Rexanne Mano, PT 07/05/2016, 8:18 PM  [facility] [address] [place], [place], [address] Phone: [phone]   Fax:  [phone]  Name: Satoshi Kalas MRN: [id number] Date of Birth: 1932-06-03

## 2016-07-08 ENCOUNTER — Ambulatory Visit: Payer: Medicare Other | Admitting: Physical Therapy

## 2016-07-08 DIAGNOSIS — R2689 Other abnormalities of gait and mobility: Secondary | ICD-10-CM | POA: Diagnosis not present

## 2016-07-08 DIAGNOSIS — R2681 Unsteadiness on feet: Secondary | ICD-10-CM

## 2016-07-08 NOTE — Therapy (Signed)
[facility] [address] [place], [place], [address] Phone: [phone]   Fax:  [phone]  Physical Therapy Treatment  Patient Details  Name: Victor Osborne MRN: [id number] Date of Birth: [birth year] Referring Provider: Alonza Bogus, DO  Encounter Date: 07/08/2016      PT End of Session - 07/08/16 1331    Visit Number 9   Number of Visits 9   Date for PT Re-Evaluation 08/09/16   Authorization Type Medicare/[employer]-G[employer] every 10th visit   PT Start Time 0934   PT Stop Time 1020   PT Time Calculation (min) 46 min   Activity Tolerance Patient tolerated treatment well   Behavior During Therapy [facility] for tasks assessed/performed      Past Medical History:  Diagnosis Date  . Bipolar 1 disorder ([place])   . COPD (chronic obstructive pulmonary disease) ([place])   . Depression   . First degree AV block   . Hypertension    pt denies but was in medical record  . Hypoglycemia, unspecified   . Myalgia and myositis, unspecified   . Obesity   . Pneumonia 1970  . Skin cancer (melanoma) ([place])   . Thyroid disease   . Unspecified hypothyroidism   . UPJ obstruction, congenital     Past Surgical History:  Procedure Laterality Date  . APPENDECTOMY  1939  . malignant melanoma removed from right forehead  2008  . PERMANENT PACEMAKER INSERTION N/A 06/19/2013   Procedure: PERMANENT PACEMAKER INSERTION;  Surgeon: Evans Lance, MD;  Location: [facility] CATH LAB;  Service: Cardiovascular;  Laterality: N/A;  . right big toe  surgery  2009  . TRANSURETHRAL RESECTION OF PROSTATE  02/04/2011   Procedure: TRANSURETHRAL RESECTION OF THE PROSTATE (TURP);  Surgeon: Dutch Gray, MD;  Location: [facility] ORS;  Service: Urology;  Laterality: N/A;  . URETHRA SURGERY  2010   reconstruction    There were no vitals filed for this visit.      Subjective Assessment - 07/08/16 0940    Subjective Been a busy week-trying to keep everything together.   No falls.     Patient is accompained by: Family member   Pertinent History Parkinson's x at least 3-5 years; bipolar disorder, COPD, pacemaker, HTN   Patient Stated Goals Pt's goals for physical therapy are to maintain mobility as much as possible-walking, balance, backing up.   Currently in Pain? No/denies                         [facility] Adult PT Treatment/Exercise - 07/08/16 0945      Ambulation/Gait   Ambulation/Gait Yes   Ambulation/Gait Assistance 6: Modified independent (Device/Increase time)   Ambulation/Gait Assistance Details Gait activities on outdoor surfaces, pushing wife's wheelchair to simulate how they function in community.  Cues provided for increased step length   Ambulation Distance (Feet) 1000 Feet   Assistive device None  pushes wife's wheelchair outside   Gait Pattern Step-through pattern;Poor foot clearance - left;Poor foot clearance - right   Ambulation Surface Level;Indoor;Unlevel;Outdoor;Gravel;Grass     High Level Balance   High Level Balance Comments Reviewed pt's HEP, including standing PWR! Moves for posture, weightshifting, trunk rotation and stepping; PWR! Up x 10, PWR! Rock x 10, [place]! Twist x 10, PWR! Step (side) x 10 reps.  Then patient performed forward step and weightshift x 10 reps, then back step and weigthshift x 10 reps.  Pt return demo understanding of HEP.  Self-Care   Self-Care Other Self-Care Comments   Other Self-Care Comments  Provided information on local Parkinson's disease-related resources, including Power over Parkinson's and PD symposium                PT Education - 2016-07-20 1330    Education provided Yes   Education Details progress towards goals, plan for d/c this visit; discussed local PD resources and encouraged patient to seek out local Movement Disorders specialist and PD resources when they move to [facility]) Educated Patient   Methods Explanation   Comprehension Verbalized understanding           PT Short Term Goals - 01/27/16 2059      PT SHORT TERM GOAL #1   Title Same as LTGs           PT Long Term Goals - 20-Jul-2016 1062      PT LONG TERM GOAL #1   Title Pt will be independent with HEP to address balance, gait, transfers.  TARGET 07/10/16   Time 4   Period Weeks   Status Achieved     PT LONG TERM GOAL #2   Title Pt will improve 5x sit<>stand to less than or equal to 13 seconds for improved efficiency and safety with transfers.   Baseline 6/19  14.53 sec   Time 4   Period Weeks   Status Partially Met     PT LONG TERM GOAL #3   Title Pt will improve Functional GAit Assessment to at least 22/30 for decreased fall risk.   Baseline 6/19  22/30   Time 4   Period Weeks   Status Achieved     PT LONG TERM GOAL #4   Title Pt will ambulate outdoor, unlevel surfaces, at least 1000 ft, independently, no LOB, for improved community gait.   Time 4   Period Weeks   Status Achieved     PT LONG TERM GOAL #5   Title Pt will verbalize understanding of local Parkinson's disease resources.   Time 4   Period Weeks   Status Achieved     PT LONG TERM GOAL #6   Title Pt will verbalize understanding of fall prevention in the home environment.   Time 4   Period Weeks   Status Achieved               Plan - 20-Jul-2016 1333    Clinical Impression Statement Pt has met LTG 1, 3, 4, 5, and 6.  LTG 2 not met for sit<>stand transfers.  Education provided on local PD resources and fall prevention, including posture/positioning and weightshifting to assist wife as needed.  Pt is appropriate for discharge from PT at this time.   Rehab Potential Good   PT Frequency 2x / week   PT Duration 4 weeks   PT Treatment/Interventions ADLs/Self Care Home Management;Functional mobility training;Gait training;Therapeutic activities;Therapeutic exercise;Balance training;Neuromuscular re-education;Patient/family education   PT Next Visit Plan Discharge PT this visit.   Consulted and  Agree with Plan of Care Patient      Patient will benefit from skilled therapeutic intervention in order to improve the following deficits and impairments:  Abnormal gait, Decreased balance, Decreased mobility, Difficulty walking, Impaired flexibility, Postural dysfunction  Visit Diagnosis: Other abnormalities of gait and mobility  Unsteadiness on feet       G-Codes - 07-20-2016 1337    Functional Assessment Tool Used (Outpatient Only) 5x sit<>stand 14.53 sec, FGA 19/30   Functional Limitation Mobility: Walking  and moving around   Mobility: Walking and Moving Around Goal Status [phone]) At least 1 percent but less than 20 percent impaired, limited or restricted   Mobility: Walking and Moving Around Discharge Status [phone]) At least 1 percent but less than 20 percent impaired, limited or restricted      Problem List Patient Active Problem List   Diagnosis Date Noted  . Pacemaker 09/24/2013  . Sinoatrial node dysfunction ([place]) 06/19/2013  . First degree AV block 04/09/2013  . Allergic rhinitis 03/17/2013  . Depressed bipolar I disorder in full remission ([place]) 12/19/2007  . DYSLIPIDEMIA 12/18/2006  . Obstructive sleep apnea 12/18/2006  . COPD mixed type ([place]) 12/18/2006  . MELANOMA, HX OF 12/18/2006    Devaeh Amadi W. 07/08/2016, 1:39 PM Frazier Butt., PT  [facility] [address] [place], [place], [address] Phone: [phone]   Fax:  [phone]  Name: Victor Osborne MRN: [id number] Date of Birth: August 19, 1932   PHYSICAL THERAPY DISCHARGE SUMMARY  Visits from Start of Care: 9  Current functional level related to goals / functional outcomes:     PT Long Term Goals - 07/08/16 2446      PT LONG TERM GOAL #1   Title Pt will be independent with HEP to address balance, gait, transfers.  TARGET 07/10/16   Time 4   Period Weeks   Status Achieved     PT LONG TERM GOAL #2   Title Pt will improve 5x  sit<>stand to less than or equal to 13 seconds for improved efficiency and safety with transfers.   Baseline 6/19  14.53 sec   Time 4   Period Weeks   Status Partially Met     PT LONG TERM GOAL #3   Title Pt will improve Functional GAit Assessment to at least 22/30 for decreased fall risk.   Baseline 6/19  22/30   Time 4   Period Weeks   Status Achieved     PT LONG TERM GOAL #4   Title Pt will ambulate outdoor, unlevel surfaces, at least 1000 ft, independently, no LOB, for improved community gait.   Time 4   Period Weeks   Status Achieved     PT LONG TERM GOAL #5   Title Pt will verbalize understanding of local Parkinson's disease resources.   Time 4   Period Weeks   Status Achieved     PT LONG TERM GOAL #6   Title Pt will verbalize understanding of fall prevention in the home environment.   Time 4   Period Weeks   Status Achieved     Pt has met 5 of 6 LTGs   Remaining deficits: Balance, posture (improving)   Education / Equipment: Educated in [employer], fall prevention, local PD resources.  Plan: Patient agrees to discharge.  Patient goals were met. Patient is being discharged due to meeting the stated rehab goals.  ?????       Mady Haagensen, PT 07/08/16 1:40 PM Phone: [phone] Fax: [phone]

## 2016-07-12 ENCOUNTER — Ambulatory Visit (INDEPENDENT_AMBULATORY_CARE_PROVIDER_SITE_OTHER): Payer: Medicare Other | Admitting: *Deleted

## 2016-07-12 ENCOUNTER — Ambulatory Visit: Payer: Medicare Other | Admitting: Physical Therapy

## 2016-07-12 DIAGNOSIS — I495 Sick sinus syndrome: Secondary | ICD-10-CM

## 2016-07-12 NOTE — Progress Notes (Signed)
Remote pacemaker transmission.   

## 2016-07-13 ENCOUNTER — Encounter: Payer: Self-pay | Admitting: Cardiology

## 2016-07-14 ENCOUNTER — Ambulatory Visit: Payer: Medicare Other | Admitting: Physical Therapy

## 2016-07-19 ENCOUNTER — Ambulatory Visit: Payer: Medicare Other | Admitting: Physical Therapy

## 2016-07-20 NOTE — Progress Notes (Signed)
Victor Osborne was seen today in the movement disorders clinic for neurologic consultation at the request of [name], DO.  The consultation is for the evaluation of Parkinsons disease.  The records that were made available to me were reviewed.  Pt apparently saw Dr. Krista Blue years ago (2012) for gait change.  I tried to get her records but they were not sent and are not scanned into the system.  Pt is a 83 y.o. male with a hx of bipolar d/o managed by Dr. Creig Hines, Psychiatry (on tegretol, seroquel, prozac).  States that he was on lithium and stellazine before this.  Lithium "ruined" my thyroid and it was changed to his current regimen years ago.   He states that his primary care doctor is the one who put him on levodopa but pt didn't know he was dx with PD until he looked at records.   Reports that his PCP prescribed levodopa many years ago ("3 or 4 or more, but I don't know")  Pt is currently on carbidopa/levodopa 25/100 tid (AM, noon, bedtime).    01/07/16 update:  The patient returns for follow-up, accompanied by his wife who supplements the history.  He had a DaT scan on 11/25/2015.  This demonstrated abnormal image grade 3: absent putamen activity in both hemispheres with asymmetrically reduced caudate activity.  He remains on carbidopa/levodopa 25/100, one tablet 3 times per day.  He tried to move them closer together but he has trouble remembering it so often takes the last one before bedtime.  No formal exercise, but he takes care of the home and his wife and the dogs.   He continues to have tremor, but no falls.  No lightheadedness or near syncope.  He does have bipolar disorder and remains on Tegretol, Prozac and Seroquel XR, 50 mg daily.  He has not had any hallucinations.  03/22/16 update:  Patient returns today for follow-up.  Overall, the patient feels that he has been doing fairly well in terms of his physical health, but he has been overwhelmed with the caregiving for his wife.  He  remains on carbidopa/levodopa 25/100, one tablet 3 times per day.  "I feel pretty good."   He has some tremor.  He did have a fall but he tripped over the dog.  He didn't get hurt.  No other falls.    No lightheadedness or near syncope.  He remains on several medications for bipolar disorder, including Seroquel XR, 50 mg daily.  He has not had any hallucinations.  No lightheadedness or near syncope.  07/5616 update:  Pt seen in follow up.  Pt on carbidopa/levodopa 25/100 tid.  Pt had a fall; he was in his hot screened in porch and looked up and got very dizzy and fell and hit his head.  He doesn't know if he had LOC but doesn't think so as "I had to get up and take care of my wife."  Does state that he is tired and "I haven't been quite the same since."  No hallucinations.  Mood has been good "until I fell down.  It is never bad but some days are better than others."  Worries about his wife and the stress of caregiving for her.  Has been attending PT and I reviewed those notes.  Pt states that he graduated.  Neuroimaging has previously been performed.   I reviewed his CT of the brain done on 03/25/2014.  There was atrophy, particularly in the frontal region.  ALLERGIES:  No Known Allergies  CURRENT MEDICATIONS:  Outpatient Encounter Prescriptions as of 07/22/2016  Medication Sig  . albuterol (PROVENTIL HFA;VENTOLIN HFA) 108 (90 BASE) MCG/ACT inhaler Inhale into the lungs. 1-2 puffs every 6 hours if needed for shortness of breath or wheezing- rescue inhaler  . alclomethasone (ACLOVATE) 0.05 % ointment Apply 1 application topically 2 (two) times daily as needed (for rash).  Marland Kitchen aspirin 81 MG tablet Take 81 mg by mouth daily.  . carbamazepine (TEGRETOL) 200 MG tablet Take 200 mg by mouth 5 (five) times daily.   . carbidopa-levodopa (SINEMET IR) 25-100 MG tablet TAKE 1 TABLET BY MOUTH 4 (FOUR) TIMES DAILY. (Patient taking differently: Take 1 tablet by mouth 3 (three) times daily. )  . cholecalciferol (VITAMIN  D) 1000 UNITS tablet Take 2,000 Units by mouth daily.   Marland Kitchen desoximetasone (TOPICORT) 0.25 % cream Apply 1 application topically 2 (two) times daily.  . Flaxseed, Linseed, (FLAX SEED OIL) 1300 MG CAPS Take 1 capsule by mouth daily.   Marland Kitchen FLUoxetine (PROZAC) 20 MG tablet Take 20 mg by mouth daily.   Marland Kitchen levothyroxine (LEVOTHROID) 125 MCG tablet Take 1 tablet (125 mcg total) by mouth daily before breakfast.  . Multiple Vitamin (MULITIVITAMIN WITH MINERALS) TABS Take 1 tablet by mouth daily.  . Omega-3 Fatty Acids (OMEGA-3 CF PO) Take 1,200 mg by mouth daily.   . pravastatin (PRAVACHOL) 40 MG tablet Take 1 tablet (40 mg total) by mouth daily.  . SEROQUEL XR 50 MG TB24 24 hr tablet Take 50 mg by mouth at bedtime.   No facility-administered encounter medications on file as of 07/22/2016.     PAST MEDICAL HISTORY:   Past Medical History:  Diagnosis Date  . Bipolar 1 disorder ([place])   . COPD (chronic obstructive pulmonary disease) ([place])   . Depression   . First degree AV block   . Hypertension    pt denies but was in medical record  . Hypoglycemia, unspecified   . Myalgia and myositis, unspecified   . Obesity   . Pneumonia 1970  . Skin cancer (melanoma) ([place])   . Thyroid disease   . Unspecified hypothyroidism   . UPJ obstruction, congenital     PAST SURGICAL HISTORY:   Past Surgical History:  Procedure Laterality Date  . APPENDECTOMY  1939  . malignant melanoma removed from right forehead  2008  . PERMANENT PACEMAKER INSERTION N/A 06/19/2013   Procedure: PERMANENT PACEMAKER INSERTION;  Surgeon: Evans Lance, MD;  Location: [facility] CATH LAB;  Service: Cardiovascular;  Laterality: N/A;  . right big toe  surgery  2009  . TRANSURETHRAL RESECTION OF PROSTATE  02/04/2011   Procedure: TRANSURETHRAL RESECTION OF THE PROSTATE (TURP);  Surgeon: Dutch Gray, MD;  Location: [facility] ORS;  Service: Urology;  Laterality: N/A;  . URETHRA SURGERY  2010   reconstruction    SOCIAL HISTORY:   Social History    Social History  . Marital status: Married    Spouse name: N/A  . Number of children: N/A  . Years of education: N/A   Occupational History  . retired    Social History Main Topics  . Smoking status: Former Smoker    Packs/day: 2.00    Years: 40.00    Types: Cigarettes  . Smokeless tobacco: Never Used  . Alcohol use No  . Drug use: No  . Sexual activity: Not on file   Other Topics Concern  . 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 Mother Deceased  . Father Deceased  . MGM Deceased  . MGF Deceased  . PGM Deceased  . PGF Deceased  . Sister Alive  . Brother Alive  . Daughter Alive  . Daughter Deceased    ROS:  A complete 10 system review of systems was obtained and was unremarkable apart from what is mentioned above.  PHYSICAL EXAMINATION:    VITALS:   Vitals:   07/22/16 0910  BP: 100/64  Pulse: 60  SpO2: 96%  Weight: 215 lb (97.5 kg)  Height: 6\' 2"  (1.88 m)    GEN:  The patient appears stated age and is in NAD. HEENT:  Normocephalic, atraumatic.  The mucous membranes are moist. The superficial temporal arteries are without ropiness or tenderness. CV:  RRR Lungs:  CTAB Neck/HEME:  There are no carotid bruits bilaterally.  Neurological examination:  Orientation:  Montreal Cognitive Assessment  03/22/2016  Visuospatial/ Executive (0/5) 1  Naming (0/3) 3  Attention: Read list of digits (0/2) 2  Attention: Read list of letters (0/1) 1  Attention: Serial 7 subtraction starting at 100 (0/3) 1  Language: Repeat phrase (0/2) 2  Language : Fluency (0/1) 1  Abstraction (0/2) 2  Delayed Recall (0/5) 4  Orientation (0/6) 5  Total 22  Adjusted Score (based on education) 22   Cranial nerves: There is good facial symmetry. Pupils are  equal round and reactive to light bilaterally. Fundoscopic exam reveals clear margins bilaterally. Extraocular muscles are intact. The visual fields are full to confrontational testing. The speech is fluent and clear. Soft palate rises symmetrically and there is no tongue deviation. Hearing is intact to conversational tone. Sensation: Sensation is intact to light and pinprick throughout (facial, trunk, extremities). Vibration is decreased at the bilateral big toe. There is no extinction with double simultaneous stimulation. There is no sensory dermatomal level identified. Motor: Strength is 5/5 in the bilateral upper and lower extremities.   Shoulder shrug is equal and symmetric.  There is no pronator drift. Deep tendon reflexes: Deep tendon reflexes are 3/4 at the bilateral biceps, triceps, brachioradialis, patella and achilles.  He has bilateral pectoralis reflexes.  Plantar responses are downgoing bilaterally.  Movement examination: Tone: There is normal tone in the UE/LE Abnormal movements: There is almost no resting tremor today Coordination:  There is no decremation with RAM's, with any form of RAMS, including alternating supination and pronation of the forearm, hand opening and closing, finger taps, heel taps and toe taps. Gait and Station: The patient has no difficulty arising out of a deep-seated chair without the use of the hands. The patient's stride length is normal.  He has mild trouble with the turns.   Decreased arm swing on the L  ASSESSMENT/PLAN:  1.  Parkinsonism.  -Had a DaT scan that was abnormal on 11/25/15  -continue carbidopa/levodopa 25/100 tid.  He is doing well.  Caregiving burden is biggest issue right now.    -Discussed that we used to believe that levodopa would increase risk of melanoma but now believe that PD increases risk of melanoma.  He has had two of them and gets skin checks q 6 months.  -safety discussed  -community exercise programs discussed  -  s/p fall with  some dizziness after hit head.  Will do CT head today  -discussed living situation.  Talked about assisted living and he is looking into that  2.  Hyperreflexia  -he has no neck pain.  He cannot have an MRI due to PPM.  He did have a CT neck in 2014 and it demonstrated moderate to severe multilevel degenerative changes in the neck.    3.  Much greater than 50% of this visit was spent in counseling and coordinating care.  Total face to face time:  25 min        Cc:  [name], DO

## 2016-07-21 ENCOUNTER — Ambulatory Visit: Payer: Medicare Other | Admitting: Physical Therapy

## 2016-07-22 ENCOUNTER — Ambulatory Visit (INDEPENDENT_AMBULATORY_CARE_PROVIDER_SITE_OTHER): Payer: Medicare Other | Admitting: Neurology

## 2016-07-22 ENCOUNTER — Telehealth: Payer: Self-pay | Admitting: Neurology

## 2016-07-22 ENCOUNTER — Encounter: Payer: Self-pay | Admitting: Neurology

## 2016-07-22 ENCOUNTER — Ambulatory Visit
Admission: RE | Admit: 2016-07-22 | Discharge: 2016-07-22 | Disposition: A | Discharge status: Home / Self Care | Payer: Medicare Other | Source: Ambulatory Visit | Attending: Neurology | Admitting: Neurology

## 2016-07-22 VITALS — BP 100/64 | HR 60 | Ht 74.0 in | Wt 215.0 lb

## 2016-07-22 DIAGNOSIS — Z9181 History of falling: Secondary | ICD-10-CM

## 2016-07-22 DIAGNOSIS — S0990XA Unspecified injury of head, initial encounter: Secondary | ICD-10-CM

## 2016-07-22 DIAGNOSIS — G20 Parkinson's disease: Secondary | ICD-10-CM

## 2016-07-22 DIAGNOSIS — G20A1 Parkinson's disease without dyskinesia, without mention of fluctuations: Secondary | ICD-10-CM

## 2016-07-22 NOTE — Telephone Encounter (Signed)
Victor Osborne made aware we received report.

## 2016-07-22 NOTE — Telephone Encounter (Signed)
Left message on machine for patient to call back.

## 2016-07-22 NOTE — Telephone Encounter (Signed)
Romie Minus from Imaging left a message saying that the CT scan report was available and to please call her back and confirm you received this CB# [phone]

## 2016-07-22 NOTE — Telephone Encounter (Signed)
-----   Message from [place], DO sent at 07/22/2016 11:46 AM EDT ----- Let pt know that brain itself okay.  Looks like he has a bump on scalp but brain uninjured.

## 2016-07-25 NOTE — Telephone Encounter (Signed)
[facility] Making patient aware of CT results and to call if he has any questions.

## 2016-07-26 LAB — CUP PACEART REMOTE DEVICE CHECK
Battery Remaining Longevity: 114 mo
Battery Remaining Percentage: 100 %
Brady Statistic RA Percent Paced: 82 %
Brady Statistic RV Percent Paced: 100 %
Date Time Interrogation Session: 20180626051300
Implantable Lead Implant Date: 20150603
Implantable Lead Implant Date: 20150603
Implantable Lead Location: 753859
Implantable Lead Location: 753860
Implantable Lead Model: 4136
Implantable Lead Model: 4137
Implantable Lead Serial Number: 29477746
Implantable Lead Serial Number: 29617326
Implantable Pulse Generator Implant Date: 20150603
Lead Channel Impedance Value: 621 Ohm
Lead Channel Impedance Value: 728 Ohm
Lead Channel Pacing Threshold Amplitude: 0.7 V
Lead Channel Pacing Threshold Amplitude: 1.3 V
Lead Channel Pacing Threshold Pulse Width: 0.4 ms
Lead Channel Pacing Threshold Pulse Width: 0.5 ms
Lead Channel Setting Pacing Amplitude: 1.8 V
Lead Channel Setting Pacing Amplitude: 2 V
Lead Channel Setting Pacing Pulse Width: 0.4 ms
Lead Channel Setting Sensing Sensitivity: 2.5 mV
Pulse Gen Serial Number: 389736

## 2016-07-27 ENCOUNTER — Encounter: Payer: Self-pay | Admitting: Cardiology

## 2016-09-08 ENCOUNTER — Ambulatory Visit: Payer: Medicare Other | Admitting: Family Medicine

## 2016-09-08 ENCOUNTER — Encounter: Payer: Self-pay | Admitting: Family Medicine

## 2016-09-08 ENCOUNTER — Ambulatory Visit (INDEPENDENT_AMBULATORY_CARE_PROVIDER_SITE_OTHER): Payer: Medicare Other | Admitting: Family Medicine

## 2016-09-08 VITALS — BP 110/70 | HR 60 | Temp 97.5°F | Ht 74.0 in | Wt 216.3 lb

## 2016-09-08 DIAGNOSIS — I495 Sick sinus syndrome: Secondary | ICD-10-CM

## 2016-09-08 DIAGNOSIS — G20 Parkinson's disease: Secondary | ICD-10-CM | POA: Diagnosis not present

## 2016-09-08 DIAGNOSIS — J449 Chronic obstructive pulmonary disease, unspecified: Secondary | ICD-10-CM | POA: Diagnosis not present

## 2016-09-08 DIAGNOSIS — E785 Hyperlipidemia, unspecified: Secondary | ICD-10-CM

## 2016-09-08 DIAGNOSIS — Z23 Encounter for immunization: Secondary | ICD-10-CM

## 2016-09-08 DIAGNOSIS — F3176 Bipolar disorder, in full remission, most recent episode depressed: Secondary | ICD-10-CM

## 2016-09-08 DIAGNOSIS — E039 Hypothyroidism, unspecified: Secondary | ICD-10-CM

## 2016-09-08 NOTE — Addendum Note (Signed)
Addended by: Agnes Lawrence on: 09/08/2016 03:46 PM   Modules accepted: Orders

## 2016-09-08 NOTE — Progress Notes (Signed)
HPI:  Victor Osborne is a pleasant 83 y.o. here for follow up. Chronic medical problems summarized below were reviewed for changes. He reports he is doing well for the most part. He and his wife are in the process of possibly moving seen. Has been handling the stress fairly well. He wants to skip his labs today as he is not fasting, but he does agree to do these at his next visit. Denies CP, SOB, DOE, treatment intolerance or new symptoms. -he is seeing [name] today for his annual wellness visit  COPD with emphysema; OSA: -sees pulmonologist -meds: none -long hx smoking, quit > 20 years ago  symptomatic brady s/p pacemaker placement/sinoatrial node dysfunction/HLD: -sees cardiologist -meds: pravastatin  Hx Malignant Melanoma/Psoriasis: -managed by dermatolgist -Dr. Delman Cheadle - Dermatology Specialist  UPJ congenital obstruction/Prostate hypertrophy/Hx Renal insufficiency: -sees [facility] -stopped regular aleve use which helped renal function  Bipolar disorder: -managed by Dr. Creig Hines, Psychiatry -medications: tegretol, seroquel, prozac -remote hx hospitalization twice in the past -retired Engineer, structural  Parkinson's Dz: -seeing Dr. Carles Collet for management -on sinamet,  -walks with cane  Hypothyroidism: -no hx cancer -takes levothyroxine 144mcg  Sacroiliitis/low back pain: -rarely uses tramadol for this on bad days - < 1/2 times per month for bad pain if needs to be active -well aware of risks -was using aleve - but had some mild renal insufficiency so stopped  ROS: See pertinent positives and negatives per HPI.  Past Medical History:  Diagnosis Date  . Bipolar 1 disorder ([place])   . COPD (chronic obstructive pulmonary disease) ([place])   . Depression   . First degree AV block   . Hypertension    pt denies but was in medical record  . Hypoglycemia, unspecified   . Myalgia and myositis, unspecified   . Obesity   . Pneumonia 1970  . Skin cancer (melanoma)  ([place])   . Thyroid disease   . Unspecified hypothyroidism   . UPJ obstruction, congenital     Past Surgical History:  Procedure Laterality Date  . APPENDECTOMY  1939  . malignant melanoma removed from right forehead  2008  . PERMANENT PACEMAKER INSERTION N/A 06/19/2013   Procedure: PERMANENT PACEMAKER INSERTION;  Surgeon: Evans Lance, MD;  Location: [facility] CATH LAB;  Service: Cardiovascular;  Laterality: N/A;  . right big toe  surgery  2009  . TRANSURETHRAL RESECTION OF PROSTATE  02/04/2011   Procedure: TRANSURETHRAL RESECTION OF THE PROSTATE (TURP);  Surgeon: Dutch Gray, MD;  Location: [facility] ORS;  Service: Urology;  Laterality: N/A;  . URETHRA SURGERY  2010   reconstruction    Family History  Problem Relation Age of Onset  . Cerebral aneurysm Mother   . Lung cancer Father   . Drug abuse Daughter     Social History   Social History  . Marital status: Married    Spouse name: N/A  . Number of children: N/A  . Years of education: N/A   Occupational History  . retired    Social History Main Topics  . Smoking status: Former Smoker    Packs/day: 2.00    Years: 40.00    Types: Cigarettes  . Smokeless tobacco: Never Used  . Alcohol use No  . Drug use: No  . Sexual activity: Not on file   Other Topics Concern  . 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  alclomethasone (ACLOVATE) 0.05 % ointment, Apply 1 application topically 2 (two) times daily as needed (for rash)., Disp: , Rfl:  .  carbamazepine (TEGRETOL) 200 MG tablet, Take 200 mg by mouth 5 (five) times daily. , Disp: , Rfl:  .  carbidopa-levodopa (SINEMET IR) 25-100 MG tablet, TAKE 1 TABLET BY MOUTH 4 (FOUR) TIMES DAILY. (Patient taking differently: Take 1 tablet by mouth 3 (three) times daily. ), Disp: 360  tablet, Rfl: 1 .  cholecalciferol (VITAMIN D) 1000 UNITS tablet, Take 2,000 Units by mouth daily. , Disp: , Rfl:  .  desoximetasone (TOPICORT) 0.25 % cream, Apply 1 application topically 2 (two) times daily., Disp: , Rfl:  .  Flaxseed, Linseed, (FLAX SEED OIL) 1300 MG CAPS, Take 1 capsule by mouth daily. , Disp: , Rfl:  .  FLUoxetine (PROZAC) 20 MG tablet, Take 20 mg by mouth daily. , Disp: , Rfl:  .  levothyroxine (LEVOTHROID) 125 MCG tablet, Take 1 tablet (125 mcg total) by mouth daily before breakfast., Disp: 90 tablet, Rfl: 3 .  Multiple Vitamin (MULITIVITAMIN WITH MINERALS) TABS, Take 1 tablet by mouth daily., Disp: , Rfl:  .  Omega-3 Fatty Acids (OMEGA-3 CF PO), Take 1,200 mg by mouth daily. , Disp: , Rfl:  .  pravastatin (PRAVACHOL) 40 MG tablet, Take 1 tablet (40 mg total) by mouth daily., Disp: 90 tablet, Rfl: 3 .  SEROQUEL XR 50 MG TB24 24 hr tablet, Take 50 mg by mouth at bedtime., Disp: , Rfl: 3  EXAM:  Vitals:   09/08/16 1413  BP: 110/70  Pulse: 60  Temp: (!) 97.5 F (36.4 C)    Body mass index is 27.77 kg/m.  GENERAL: vitals reviewed and listed above, alert, oriented, appears well hydrated and in no acute distress  HEENT: atraumatic, conjunttiva clear, no obvious abnormalities on inspection of external nose and ears  NECK: no obvious masses on inspection  LUNGS: clear to auscultation bilaterally, no wheezes, rales or rhonchi, good air movement  CV: HRRR, no peripheral edema  MS: moves all extremities without noticeable abnormality  PSYCH: pleasant and cooperative, no obvious depression or anxiety  ASSESSMENT AND PLAN:  Discussed the following assessment and plan:  Need for immunization against influenza - Plan: Flu vaccine HIGH DOSE PF (Fluzone High dose)  Hyperlipidemia, unspecified hyperlipidemia type  COPD mixed type (HCC)  Depressed bipolar I disorder in full remission ([place])  Sinoatrial node dysfunction (HCC)  Parkinson disease  ([place])  Hypothyroidism, unspecified type  -See several specialists for management of his COPD, depression, heart conditions and Parkinson's disease - seems to be doing well -Offered labs today, he would like to wait and do these when he is fasting at his next visit -Continue current medications -He plans to see [name] after his visit with me today for his annual wellness visit -Patient advised to return or notify a doctor immediately if symptoms worsen or persist or new concerns arise.  Patient Instructions  BEFORE YOU LEAVE: -AWV with [name] -? Flu shot and prevnar 13 if he wishes -follow up: 3-4 months, morning appt and will do fasting labs then     [name] R., DO

## 2016-09-08 NOTE — Progress Notes (Addendum)
Subjective:   Victor Osborne is a 83 y.o. male who presents for Medicare Annual/Subsequent preventive examination.  The Patient was informed that the wellness visit is to identify future health risk and educate and initiate measures that can reduce risk for increased disease through the lifespan.    Annual Wellness Assessment  Reports health as good, but balance is not as good Get up and go good today   Preventive Screening -Counseling & Management  Medicare Annual Preventive Care Visit - Subsequent Last OV today was today   VS reviewed;   Smoking hx but aged out of LDCT screen Also has had abd ct in the past / waived AAA   Diet  Has hamburger and potatoes for breakfast For lunch; goes to [employer] Ice cream and sandwich for supper  BMI 27  Exercise; can't leave wife over the last week Stays at home with her.  Recommended they try to go to the senior center where he can be more active and she can watch and they can eat lunch together there.  Lives in 2 level home but they do not go upstairs. Has acreage to keep and the the grand-dtr whom they have raised is the POA and is helping them move to a place he can afford.  Gave him resources for the [place], as he has not signed up and they do offer home care which may be helpful   Stressors; states he has bi-polar hx but feels good and well managed on med. Denies depression; very hopeful and positive    Sleep patterns: sleeps well    Advanced Directives completed   Patient Care Team: [name], DO as PCP - General (Family Medicine)  Immunization History  Administered Date(s) Administered  . Influenza Split 10/18/2010, 10/18/2011, 10/17/2012, 10/04/2013  . Influenza, High Dose Seasonal PF 10/01/2015, 09/08/2016  . Influenza,inj,Quad PF,6+ Mos 10/18/2014  . Pneumococcal Conjugate-13 09/08/2016  . Pneumococcal Polysaccharide-23 10/28/2011  . Tdap 06/18/2011   Required Immunizations needed today  Screening  test up to date or reviewed for plan of completion There are no preventive care reminders to display for this patient.  Agreed to take his flu vaccine and his PCV 13 today    Cardiac Risk Factors include: advanced age (>44men, >66 women);dyslipidemia     Objective:    Vitals: BP 110/70   Pulse 60   Temp (!) 97.5 F (36.4 C) (Oral)   Ht 6\' 2"  (1.88 m)   Wt 216 lb 4.8 oz (98.1 kg)   BMI 27.77 kg/m   Body mass index is 27.77 kg/m.  Tobacco History  Smoking Status  . Former Smoker  . Packs/day: 2.00  . Years: 40.00  . Types: Cigarettes  Smokeless Tobacco  . Never Used     Counseling given: Yes   Past Medical History:  Diagnosis Date  . Bipolar 1 disorder ([place])   . COPD (chronic obstructive pulmonary disease) ([place])   . Depression   . First degree AV block   . Hypertension    pt denies but was in medical record  . Hypoglycemia, unspecified   . Myalgia and myositis, unspecified   . Obesity   . Pneumonia 1970  . Skin cancer (melanoma) ([place])   . Thyroid disease   . Unspecified hypothyroidism   . UPJ obstruction, congenital    Past Surgical History:  Procedure Laterality Date  . APPENDECTOMY  1939  . malignant melanoma removed from right forehead  2008  . PERMANENT PACEMAKER  INSERTION N/A 06/19/2013   Procedure: PERMANENT PACEMAKER INSERTION;  Surgeon: Evans Lance, MD;  Location: [facility] CATH LAB;  Service: Cardiovascular;  Laterality: N/A;  . right big toe  surgery  2009  . TRANSURETHRAL RESECTION OF PROSTATE  02/04/2011   Procedure: TRANSURETHRAL RESECTION OF THE PROSTATE (TURP);  Surgeon: Dutch Gray, MD;  Location: [facility] ORS;  Service: Urology;  Laterality: N/A;  . URETHRA SURGERY  2010   reconstruction   Family History  Problem Relation Age of Onset  . Cerebral aneurysm Mother   . Lung cancer Father   . Drug abuse Daughter    History  Sexual Activity  . Sexual activity: Not on file    Outpatient Encounter Prescriptions as of 09/08/2016  Medication Sig  .  alclomethasone (ACLOVATE) 0.05 % ointment Apply 1 application topically 2 (two) times daily as needed (for rash).  . carbamazepine (TEGRETOL) 200 MG tablet Take 200 mg by mouth 5 (five) times daily.   . carbidopa-levodopa (SINEMET IR) 25-100 MG tablet TAKE 1 TABLET BY MOUTH 4 (FOUR) TIMES DAILY. (Patient taking differently: Take 1 tablet by mouth 3 (three) times daily. )  . cholecalciferol (VITAMIN D) 1000 UNITS tablet Take 2,000 Units by mouth daily.   Marland Kitchen desoximetasone (TOPICORT) 0.25 % cream Apply 1 application topically 2 (two) times daily.  . Flaxseed, Linseed, (FLAX SEED OIL) 1300 MG CAPS Take 1 capsule by mouth daily.   Marland Kitchen FLUoxetine (PROZAC) 20 MG tablet Take 20 mg by mouth daily.   Marland Kitchen levothyroxine (LEVOTHROID) 125 MCG tablet Take 1 tablet (125 mcg total) by mouth daily before breakfast.  . Multiple Vitamin (MULITIVITAMIN WITH MINERALS) TABS Take 1 tablet by mouth daily.  . Omega-3 Fatty Acids (OMEGA-3 CF PO) Take 1,200 mg by mouth daily.   . pravastatin (PRAVACHOL) 40 MG tablet Take 1 tablet (40 mg total) by mouth daily.  . SEROQUEL XR 50 MG TB24 24 hr tablet Take 50 mg by mouth at bedtime.  . [DISCONTINUED] albuterol (PROVENTIL HFA;VENTOLIN HFA) 108 (90 BASE) MCG/ACT inhaler Inhale into the lungs. 1-2 puffs every 6 hours if needed for shortness of breath or wheezing- rescue inhaler  . [DISCONTINUED] aspirin 81 MG tablet Take 81 mg by mouth daily.   No facility-administered encounter medications on file as of 09/08/2016.     Activities of Daily Living In your present state of health, do you have any difficulty performing the following activities: 09/08/2016  Hearing? N  Vision? N  Difficulty concentrating or making decisions? N  Walking or climbing stairs? N  Dressing or bathing? N  Doing errands, shopping? N  Preparing Food and eating ? N  Using the Toilet? N  In the past six months, have you accidently leaked urine? N  Do you have problems with loss of bowel control? N  Managing  your Medications? N  Managing your Finances? N  Housekeeping or managing your Housekeeping? N  Some recent data might be hidden    Patient Care Team: [name], DO as PCP - General (Family Medicine)   Assessment:     Exercise Activities and Dietary recommendations Current Exercise Habits: Home exercise routine (taking care of wife and home full time), Intensity: Moderate  Goals    . patient          Will continue to stay together!         Fall Risk Fall Risk  09/08/2016 07/22/2016 03/22/2016 11/06/2015  Falls in the past year? No Yes Yes Yes  Number falls  in past yr: - 1 2 or more 2 or more  Injury with Fall? - - No No  Risk Factor Category  - High Fall Risk High Fall Risk -  Follow up - Falls evaluation completed Falls evaluation completed Falls evaluation completed   Depression Screen PHQ 2/9 Scores 09/08/2016  PHQ - 2 Score 0    Cognitive Function   Montreal Cognitive Assessment  03/22/2016  Visuospatial/ Executive (0/5) 1  Naming (0/3) 3  Attention: Read list of digits (0/2) 2  Attention: Read list of letters (0/1) 1  Attention: Serial 7 subtraction starting at 100 (0/3) 1  Language: Repeat phrase (0/2) 2  Language : Fluency (0/1) 1  Abstraction (0/2) 2  Delayed Recall (0/5) 4  Orientation (0/6) 5  Total 22  Adjusted Score (based on education) 22   6CIT Screen 09/08/2016  What Year? 0 points  What month? 0 points  What time? 0 points  Count back from 20 0 points  Months in reverse 0 points  Repeat phrase 0 points  Total Score 0    Immunization History  Administered Date(s) Administered  . Influenza Split 10/18/2010, 10/18/2011, 10/17/2012, 10/04/2013  . Influenza, High Dose Seasonal PF 10/01/2015, 09/08/2016  . Influenza,inj,Quad PF,6+ Mos 10/18/2014  . Pneumococcal Conjugate-13 09/08/2016  . Pneumococcal Polysaccharide-23 10/28/2011  . Tdap 06/18/2011   Screening Tests Health Maintenance  Topic Date Due  . TETANUS/TDAP  06/17/2021  .  INFLUENZA VACCINE  Completed  . PNA vac Low Risk Adult  Completed      Plan:     PCP Notes   Health Maintenance LDCT waived due to age Has had CT angio of the abd in 2010 / waived AAA   Took PCV 13 and flu vaccine today per CMA  MMSE exam completed; has HCPOA assisting with management;  Per assessment; judgement is good and providing appropriate assistance and care for his wife  Will schedule an eye exam; was scheduled but wife had anxiety and had to cancel   Abnormal Screens  none  Referrals  To the [place] [facility] [place] to sign up   Patient concerns; Not concerned for himself;  Given resources to assist with grocery delivery   Nurse Concerns; At risk couple appears to be doing well., POA planning to move the family   Next PCP apt in 4 months   I have personally reviewed and noted the following in the patient's chart:   . Medical and social history . Use of alcohol, tobacco or illicit drugs  . Current medications and supplements . Functional ability and status . Nutritional status . Physical activity . Advanced directives . List of other physicians . Hospitalizations, surgeries, and ER visits in previous 12 months . Vitals . Screenings to include cognitive, depression, and falls . Referrals and appointments  In addition, I have reviewed and discussed with patient certain preventive protocols, quality metrics, and best practice recommendations. A written personalized care plan for preventive services as well as general preventive health recommendations were provided to patient.     Wynetta Fines, RN  09/08/2016  [name]., DO

## 2016-09-08 NOTE — Patient Instructions (Addendum)
BEFORE YOU LEAVE: -AWV with Victor Osborne -? Flu shot and prevnar 13 if he wishes -follow up: 3-4 months, morning appt and will do fasting labs then   Victor Osborne , Thank you for taking time to come for your Medicare Wellness Visit. I appreciate your ongoing commitment to your health goals. Please review the following plan we discussed and let me know if I can assist you in the future.   Veterans want to access their VA benefits can call [facility]  [address], [place] [address]  Phone: [phone] Or [phone] Fax: [phone]  OR   They can to to the [facility] office and call [phone] and If the prompt starts, hit 0 and then ask for eligibility. They will assist you to sign up for medical benefits.    These are the goals we discussed: Goals    . patient          Will continue to stay together!          This is a list of the screening recommended for you and due dates:  Health Maintenance  Topic Date Due  . Pneumonia vaccines (1 of 2 - PCV13) 11/05/1999  . Tetanus Vaccine  06/17/2021  . Flu Shot  Completed   Had his PCV 13 today Had flu vaccine today   Health Maintenance, Male A healthy lifestyle and preventive care is important for your health and wellness. Ask your health care provider about what schedule of regular examinations is right for you. What should I know about weight and diet? Eat a Healthy Diet  Eat plenty of vegetables, fruits, whole grains, low-fat dairy products, and lean protein.  Do not eat a lot of foods high in solid fats, added sugars, or salt.  Maintain a Healthy Weight Regular exercise can help you achieve or maintain a healthy weight. You should:  Do at least 150 minutes of exercise each week. The exercise should increase your heart rate and make you sweat (moderate-intensity exercise).  Do strength-training exercises at least twice a week.  Watch Your Levels of Cholesterol and Blood  Lipids  Have your blood tested for lipids and cholesterol every 5 years starting at 81 years of age. If you are at high risk for heart disease, you should start having your blood tested when you are 81 years old. You may need to have your cholesterol levels checked more often if: ? Your lipid or cholesterol levels are high. ? You are older than 81 years of age. ? You are at high risk for heart disease.  What should I know about cancer screening? Many types of cancers can be detected early and may often be prevented. Lung Cancer  You should be screened every year for lung cancer if: ? You are a current smoker who has smoked for at least 30 years. ? You are a former smoker who has quit within the past 15 years.  Talk to your health care provider about your screening options, when you should start screening, and how often you should be screened.  Colorectal Cancer  Routine colorectal cancer screening usually begins at 81 years of age and should be repeated every 5-10 years until you are 81 years old. You may need to be screened more often if early forms of precancerous polyps or small growths are found. Your health care provider may recommend screening at an earlier age if you have risk factors for colon cancer.  Your health care  provider may recommend using home test kits to check for hidden blood in the stool.  A small camera at the end of a tube can be used to examine your colon (sigmoidoscopy or colonoscopy). This checks for the earliest forms of colorectal cancer.  Prostate and Testicular Cancer  Depending on your age and overall health, your health care provider may do certain tests to screen for prostate and testicular cancer.  Talk to your health care provider about any symptoms or concerns you have about testicular or prostate cancer.  Skin Cancer  Check your skin from head to toe regularly.  Tell your health care provider about any new moles or changes in moles, especially  if: ? There is a change in a mole's size, shape, or color. ? You have a mole that is larger than a pencil eraser.  Always use sunscreen. Apply sunscreen liberally and repeat throughout the day.  Protect yourself by wearing long sleeves, pants, a wide-brimmed hat, and sunglasses when outside.  What should I know about heart disease, diabetes, and high blood pressure?  If you are 25-74 years of age, have your blood pressure checked every 3-5 years. If you are 35 years of age or older, have your blood pressure checked every year. You should have your blood pressure measured twice-once when you are at a hospital or clinic, and once when you are not at a hospital or clinic. Record the average of the two measurements. To check your blood pressure when you are not at a hospital or clinic, you can use: ? An automated blood pressure machine at a pharmacy. ? A home blood pressure monitor.  Talk to your health care provider about your target blood pressure.  If you are between 67-37 years old, ask your health care provider if you should take aspirin to prevent heart disease.  Have regular diabetes screenings by checking your fasting blood sugar level. ? If you are at a normal weight and have a low risk for diabetes, have this test once every three years after the age of 38. ? If you are overweight and have a high risk for diabetes, consider being tested at a younger age or more often.  A one-time screening for abdominal aortic aneurysm (AAA) by ultrasound is recommended for men aged 27-75 years who are current or former smokers. What should I know about preventing infection? Hepatitis B If you have a higher risk for hepatitis B, you should be screened for this virus. Talk with your health care provider to find out if you are at risk for hepatitis B infection. Hepatitis C Blood testing is recommended for:  Everyone born from [birth year] through 1963.  Anyone with known risk factors for hepatitis  C.  Sexually Transmitted Diseases (STDs)  You should be screened each year for STDs including gonorrhea and chlamydia if: ? You are sexually active and are younger than 81 years of age. ? You are older than 81 years of age and your health care provider tells you that you are at risk for this type of infection. ? Your sexual activity has changed since you were last screened and you are at an increased risk for chlamydia or gonorrhea. Ask your health care provider if you are at risk.  Talk with your health care provider about whether you are at high risk of being infected with HIV. Your health care provider may recommend a prescription medicine to help prevent HIV infection.  What else can I do?  Schedule regular  health, dental, and eye exams.  Stay current with your vaccines (immunizations).  Do not use any tobacco products, such as cigarettes, chewing tobacco, and e-cigarettes. If you need help quitting, ask your health care provider.  Limit alcohol intake to no more than 2 drinks per day. One drink equals 12 ounces of beer, 5 ounces of wine, or 1 ounces of hard liquor.  Do not use street drugs.  Do not share needles.  Ask your health care provider for help if you need support or information about quitting drugs.  Tell your health care provider if you often feel depressed.  Tell your health care provider if you have ever been abused or do not feel safe at home. This information is not intended to replace advice given to you by your health care provider. Make sure you discuss any questions you have with your health care provider. Document Released: 07/02/2007 Document Revised: 09/02/2015 Document Reviewed: 10/07/2014 Elsevier Interactive Patient Education  [employer].

## 2016-10-03 ENCOUNTER — Telehealth: Payer: Self-pay | Admitting: Neurology

## 2016-10-03 MED ORDER — CARBIDOPA-LEVODOPA 25-100 MG PO TABS: 1.0000 | ORAL_TABLET | Freq: Three times a day (TID) | ORAL | 1 refills | Status: DC

## 2016-10-03 NOTE — Telephone Encounter (Signed)
RX sent to pharmacy  

## 2016-10-03 NOTE — Telephone Encounter (Signed)
[place]  Patient needs a refill on the carbidopa- levodopa called in the pharmacy above

## 2016-10-06 ENCOUNTER — Encounter: Payer: Self-pay | Admitting: Family Medicine

## 2016-10-10 ENCOUNTER — Telehealth: Payer: Self-pay | Admitting: Family Medicine

## 2016-10-10 NOTE — Telephone Encounter (Signed)
New Message  Pts has a Physician Form that needs to be filled out and wanting it by Monday.  Pt advised it takes 3-5 Business Days to complete.  Pt wants [place] to fax documents to [phone].  Disposition in [facility] folder.

## 2016-10-11 ENCOUNTER — Ambulatory Visit (INDEPENDENT_AMBULATORY_CARE_PROVIDER_SITE_OTHER): Payer: Medicare Other | Admitting: Family Medicine

## 2016-10-11 ENCOUNTER — Encounter: Payer: Self-pay | Admitting: Family Medicine

## 2016-10-11 ENCOUNTER — Ambulatory Visit (INDEPENDENT_AMBULATORY_CARE_PROVIDER_SITE_OTHER): Payer: Medicare Other | Admitting: *Deleted

## 2016-10-11 VITALS — BP 122/80 | HR 62 | Temp 98.0°F | Ht 74.0 in | Wt 220.3 lb

## 2016-10-11 DIAGNOSIS — G20 Parkinson's disease: Secondary | ICD-10-CM | POA: Diagnosis not present

## 2016-10-11 DIAGNOSIS — Z950 Presence of cardiac pacemaker: Secondary | ICD-10-CM | POA: Diagnosis not present

## 2016-10-11 DIAGNOSIS — G4733 Obstructive sleep apnea (adult) (pediatric): Secondary | ICD-10-CM

## 2016-10-11 DIAGNOSIS — F3176 Bipolar disorder, in full remission, most recent episode depressed: Secondary | ICD-10-CM | POA: Diagnosis not present

## 2016-10-11 DIAGNOSIS — Z111 Encounter for screening for respiratory tuberculosis: Secondary | ICD-10-CM

## 2016-10-11 DIAGNOSIS — E039 Hypothyroidism, unspecified: Secondary | ICD-10-CM

## 2016-10-11 DIAGNOSIS — J449 Chronic obstructive pulmonary disease, unspecified: Secondary | ICD-10-CM | POA: Diagnosis not present

## 2016-10-11 DIAGNOSIS — I495 Sick sinus syndrome: Secondary | ICD-10-CM

## 2016-10-11 NOTE — Progress Notes (Signed)
Remote pacemaker transmission.   

## 2016-10-11 NOTE — Telephone Encounter (Signed)
Received form today - quite a detailed questionnaire and TB skin test needed. Would advise 30 minute appt for each of them today or later this week to complete with them. Ok to block acute slots to work them in if needed.

## 2016-10-11 NOTE — Telephone Encounter (Signed)
Appt scheduled for today.

## 2016-10-11 NOTE — Progress Notes (Signed)
HPI:  Victor Osborne is a pleasant 83 yo with a PMH parkinson dz (managed by neurologist), bipolar d/o (managed by psychiatry), heart block, pacemaker (sees Dr. Lovena Le), hx melanoma (sees dermatologist) hypothyroidism, OSA on CPAP and mild COPD here for completion form detailing his current health for application to senior living apartments. He if pretty functional despite his health conditions and has been caring for himself and his wife. He remains independent in AIDL, ADLs. Reports vision with glasses is good. Reports mood good and has been for quite some time.feels is in good physical health in terms of strength. Appetite good. See scanned forms for further details.  ROS: See pertinent positives and negatives per HPI.  Past Medical History:  Diagnosis Date  . Bipolar 1 disorder ([place])   . COPD (chronic obstructive pulmonary disease) ([place])   . Depression   . First degree AV block   . Hypertension    pt denies but was in medical record  . Hypoglycemia, unspecified   . Myalgia and myositis, unspecified   . Obesity   . Pneumonia 1970  . Skin cancer (melanoma) ([place])   . Thyroid disease   . Unspecified hypothyroidism   . UPJ obstruction, congenital     Past Surgical History:  Procedure Laterality Date  . APPENDECTOMY  1939  . malignant melanoma removed from right forehead  2008  . PERMANENT PACEMAKER INSERTION N/A 06/19/2013   Procedure: PERMANENT PACEMAKER INSERTION;  Surgeon: Evans Lance, MD;  Location: [facility] CATH LAB;  Service: Cardiovascular;  Laterality: N/A;  . right big toe  surgery  2009  . TRANSURETHRAL RESECTION OF PROSTATE  02/04/2011   Procedure: TRANSURETHRAL RESECTION OF THE PROSTATE (TURP);  Surgeon: Dutch Gray, MD;  Location: [facility] ORS;  Service: Urology;  Laterality: N/A;  . URETHRA SURGERY  2010   reconstruction    Family History  Problem Relation Age of Onset  . Cerebral aneurysm Mother   . Lung cancer Father   . Drug abuse Daughter     Social History    Social History  . Marital status: Married    Spouse name: N/A  . Number of children: N/A  . Years of education: N/A   Occupational History  . retired    Social History Main Topics  . Smoking status: Former Smoker    Packs/day: 2.00    Years: 40.00    Types: Cigarettes  . Smokeless tobacco: Never Used  . Alcohol use No  . Drug use: No  . Sexual activity: Not Asked   Other Topics Concern  . None   Social History Narrative   Work or School: retire Wellsite geologist Situation: lives with wife      Spiritual Beliefs: episcopalian      Lifestyle: active with yard work; diet ok        Current Outpatient Prescriptions:  .  alclomethasone (ACLOVATE) 0.05 % ointment, Apply 1 application topically 2 (two) times daily as needed (for rash)., Disp: , Rfl:  .  carbamazepine (TEGRETOL) 200 MG tablet, Take 200 mg by mouth 5 (five) times daily. , Disp: , Rfl:  .  carbidopa-levodopa (SINEMET IR) 25-100 MG tablet, Take 1 tablet by mouth 3 (three) times daily., Disp: 270 tablet, Rfl: 1 .  cholecalciferol (VITAMIN D) 1000 UNITS tablet, Take 2,000 Units by mouth daily. , Disp: , Rfl:  .  desoximetasone (TOPICORT) 0.25 % cream, Apply 1 application topically 2 (two) times daily., Disp: , Rfl:  .  Flaxseed, Linseed, (FLAX SEED OIL) 1300 MG CAPS, Take 1 capsule by mouth daily. , Disp: , Rfl:  .  FLUoxetine (PROZAC) 20 MG tablet, Take 20 mg by mouth daily. , Disp: , Rfl:  .  levothyroxine (LEVOTHROID) 125 MCG tablet, Take 1 tablet (125 mcg total) by mouth daily before breakfast., Disp: 90 tablet, Rfl: 3 .  Multiple Vitamin (MULITIVITAMIN WITH MINERALS) TABS, Take 1 tablet by mouth daily., Disp: , Rfl:  .  Omega-3 Fatty Acids (OMEGA-3 CF PO), Take 1,200 mg by mouth daily. , Disp: , Rfl:  .  pravastatin (PRAVACHOL) 40 MG tablet, Take 1 tablet (40 mg total) by mouth daily., Disp: 90 tablet, Rfl: 3 .  SEROQUEL XR 50 MG TB24 24 hr tablet, Take 50 mg by mouth at bedtime., Disp: , Rfl:  3  EXAM:  Vitals:   10/11/16 1610  BP: 122/80  Pulse: 62  Temp: 98 F (36.7 C)    Body mass index is 28.28 kg/m.  GENERAL: vitals reviewed and listed above, alert, oriented, appears well hydrated and in no acute distress  HEENT: atraumatic, conjunttiva clear, no obvious abnormalities on inspection of external nose and ears  NECK: no obvious masses on inspection  LUNGS: clear to auscultation bilaterally, no wheezes, rales or rhonchi, good air movement  CV: HRRR, no peripheral edema  MS: moves all extremities without noticeable abnormality  PSYCH: pleasant and cooperative, no obvious depression or anxiety  ASSESSMENT AND PLAN:  Discussed the following assessment and plan:  Encounter for tuberculin skin test - Plan: PPD  Hypothyroidism, unspecified type  Depressed bipolar I disorder in full remission ([place])  Obstructive sleep apnea  COPD mixed type (HCC)  Sinoatrial node dysfunction (HCC)  Pacemaker  Parkinson disease ([place])  -reviewed chronic conditions, medications, current functional status -completed forms and provided to assistant to fill in vitals, office stamp, TB skin test results, copy and return to patient -TB skin test per form request -he will come back Thursday to read skin test and pick up forms -Patient advised to return or notify a doctor immediately if symptoms worsen or persist or new concerns arise.  Patient Instructions  Follow up: -nurse visit for TB skin test reading thursday   Sylvanus Telford R., DO

## 2016-10-11 NOTE — Patient Instructions (Signed)
Follow up: -nurse visit for TB skin test reading thursday

## 2016-10-13 ENCOUNTER — Encounter: Payer: Self-pay | Admitting: Cardiology

## 2016-10-14 LAB — CUP PACEART REMOTE DEVICE CHECK
Battery Remaining Longevity: 114 mo
Battery Remaining Percentage: 100 %
Date Time Interrogation Session: 20180925042200
Implantable Lead Location: 753859
Implantable Lead Model: 4136
Implantable Lead Serial Number: 29477746
Implantable Pulse Generator Implant Date: 20150603
Lead Channel Pacing Threshold Amplitude: 0.7 V
Lead Channel Pacing Threshold Pulse Width: 0.4 ms
Lead Channel Setting Pacing Amplitude: 2 V
Lead Channel Setting Pacing Pulse Width: 0.4 ms
Lead Channel Setting Sensing Sensitivity: 2.5 mV
MDC IDC LEAD IMPLANT DT: 20150603
MDC IDC LEAD IMPLANT DT: 20150603
MDC IDC LEAD LOCATION: 753860
MDC IDC LEAD SERIAL: 29617326
MDC IDC MSMT LEADCHNL RA IMPEDANCE VALUE: 721 Ohm
MDC IDC MSMT LEADCHNL RA PACING THRESHOLD PULSEWIDTH: 0.5 ms
MDC IDC MSMT LEADCHNL RV IMPEDANCE VALUE: 633 Ohm
MDC IDC MSMT LEADCHNL RV PACING THRESHOLD AMPLITUDE: 1.4 V
MDC IDC SET LEADCHNL RV PACING AMPLITUDE: 1.9 V
MDC IDC STAT BRADY RA PERCENT PACED: 81 %
MDC IDC STAT BRADY RV PERCENT PACED: 100 %
Pulse Gen Serial Number: 389736

## 2016-10-14 LAB — TB SKIN TEST
INDURATION: 0 mm
TB SKIN TEST: NEGATIVE

## 2016-10-14 NOTE — Telephone Encounter (Signed)
Forms were faxed this AM and I called [facility] and spoke with Larene Beach and she stated she did receive the forms and they were placed in a folder for [place].

## 2016-10-18 ENCOUNTER — Encounter: Payer: Self-pay | Admitting: Internal Medicine

## 2016-10-18 ENCOUNTER — Ambulatory Visit (INDEPENDENT_AMBULATORY_CARE_PROVIDER_SITE_OTHER): Payer: Medicare Other | Admitting: Internal Medicine

## 2016-10-18 VITALS — BP 144/64 | HR 67 | Ht 74.0 in | Wt 222.2 lb

## 2016-10-18 DIAGNOSIS — Z950 Presence of cardiac pacemaker: Secondary | ICD-10-CM | POA: Diagnosis not present

## 2016-10-18 DIAGNOSIS — I495 Sick sinus syndrome: Secondary | ICD-10-CM

## 2016-10-18 NOTE — Patient Instructions (Signed)

## 2016-10-18 NOTE — Progress Notes (Signed)
HPI Victor Osborne returns today for ongoing evaluation and management of his permanent pacemaker status post insertion over 3 years ago. The patient is a very pleasant 83 year old man who has symptomatic sinus node dysfunction who underwent permanent pacemaker insertion approximately 3 years ago. He has done well in the interim. He denies chest pain or shortness of breath. No syncope. No peripheral edema. He has bothered by a resting tremor.  No Known Allergies   Current Outpatient Prescriptions  Medication Sig Dispense Refill  . alclomethasone (ACLOVATE) 0.05 % ointment Apply 1 application topically 2 (two) times daily as needed (for rash).    . carbamazepine (TEGRETOL) 200 MG tablet Take 200 mg by mouth 5 (five) times daily.     . carbidopa-levodopa (SINEMET IR) 25-100 MG tablet Take 1 tablet by mouth 3 (three) times daily. 270 tablet 1  . cholecalciferol (VITAMIN D) 1000 UNITS tablet Take 2,000 Units by mouth daily.     Marland Kitchen desoximetasone (TOPICORT) 0.25 % cream Apply 1 application topically 2 (two) times daily.    . Flaxseed, Linseed, (FLAX SEED OIL) 1300 MG CAPS Take 1 capsule by mouth daily.     Marland Kitchen FLUoxetine (PROZAC) 20 MG tablet Take 20 mg by mouth daily.     Marland Kitchen levothyroxine (LEVOTHROID) 125 MCG tablet Take 1 tablet (125 mcg total) by mouth daily before breakfast. 90 tablet 3  . Multiple Vitamin (MULITIVITAMIN WITH MINERALS) TABS Take 1 tablet by mouth daily.    . Omega-3 Fatty Acids (OMEGA-3 CF PO) Take 1,200 mg by mouth daily.     . pravastatin (PRAVACHOL) 40 MG tablet Take 1 tablet (40 mg total) by mouth daily. 90 tablet 3  . SEROQUEL XR 50 MG TB24 24 hr tablet Take 50 mg by mouth at bedtime.  3   No current facility-administered medications for this visit.      Past Medical History:  Diagnosis Date  . Bipolar 1 disorder ([place])   . COPD (chronic obstructive pulmonary disease) ([place])   . Depression   . First degree AV block   . Hypertension    pt denies but was in medical  record  . Hypoglycemia, unspecified   . Myalgia and myositis, unspecified   . Obesity   . Pneumonia 1970  . Skin cancer (melanoma) ([place])   . Thyroid disease   . Unspecified hypothyroidism   . UPJ obstruction, congenital     ROS:   All systems reviewed and negative except as noted in the HPI.   Past Surgical History:  Procedure Laterality Date  . APPENDECTOMY  1939  . malignant melanoma removed from right forehead  2008  . PERMANENT PACEMAKER INSERTION N/A 06/19/2013   Procedure: PERMANENT PACEMAKER INSERTION;  Surgeon: Evans Lance, MD;  Location[facility] LAB;  Service: Cardiovascular;  Laterality: N/A;  . right big toe  surgery  2009  . TRANSURETHRAL RESECTION OF PROSTATE  02/04/2011   Procedure: TRANSURETHRAL RESECTION OF THE PROSTATE (TURP);  Surgeon: Dutch Gray, MD;  Location: [facility] ORS;  Service: Urology;  Laterality: N/A;  . URETHRA SURGERY  2010   reconstruction     Family History  Problem Relation Age of Onset  . Cerebral aneurysm Mother   . Lung cancer Father   . Drug abuse Daughter      Social History   Social History  . Marital status: Married    Spouse name: N/A  . Number of children: N/A  . Years of education: N/A  Occupational History  . retired    Social History Main Topics  . Smoking status: Former Smoker    Packs/day: 2.00    Years: 40.00    Types: Cigarettes  . Smokeless tobacco: Never Used  . Alcohol use No  . Drug use: No  . Sexual activity: Not on file   Other Topics Concern  . 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      Spiritual Beliefs: episcopalian      Lifestyle: active with yard work; diet ok        BP (!) 144/64   Pulse 67   Ht 6\' 2"  (1.88 m)   Wt 222 lb 3.2 oz (100.8 kg)   SpO2 96%   BMI 28.53 kg/m   Physical Exam:  Well appearing 83 year old man, NAD HEENT: Unremarkable Neck:  6 cm JVD, no thyromegally Lymphatics:  No  adenopathy Back:  No CVA tenderness Lungs:  Clear, with no wheezes, rales, or rhonchi., Well-healed pacemaker incision HEART:  Regular rate rhythm, no murmurs, no rubs, no clicks Abd:  soft, positive bowel sounds, no organomegally, no rebound, no guarding Ext:  2 plus pulses, no edema, no cyanosis, no clubbing Skin:  No rashes no nodules Neuro:  CN II through XII intact, motor grossly intact  EKG - normal sinus rhythm with AV sequential pacing  DEVICE  Normal device function.  See PaceArt for details.   Assess/Plan: 1. Sinus node dysfunction/complete heart block - the patient is status post pacemaker insertion and is doing well. 2. Pacemaker - his Cherokee pacemaker is working normally. We'll plan to recheck in several months. 3. Hypertension - his blood pressure is well controlled. No change in medical therapy. He is encouraged to maintain a low-salt diet. 4. Dyslipidemia - he is tolerating his statin therapy nicely. He'll continue his current medications.  Cristopher Peru, M.D.

## 2016-10-20 LAB — CUP PACEART INCLINIC DEVICE CHECK
Date Time Interrogation Session: 20181002040000
Implantable Lead Implant Date: 20150603
Implantable Lead Location: 753859
Implantable Lead Location: 753860
Implantable Lead Serial Number: 29477746
Implantable Lead Serial Number: 29617326
Implantable Pulse Generator Implant Date: 20150603
Lead Channel Impedance Value: 610 Ohm
Lead Channel Pacing Threshold Amplitude: 0.6 V
Lead Channel Pacing Threshold Pulse Width: 0.5 ms
Lead Channel Sensing Intrinsic Amplitude: 12.2 mV
Lead Channel Sensing Intrinsic Amplitude: 4.1 mV
Lead Channel Setting Sensing Sensitivity: 2.5 mV
MDC IDC LEAD IMPLANT DT: 20150603
MDC IDC MSMT LEADCHNL RA IMPEDANCE VALUE: 721 Ohm
MDC IDC MSMT LEADCHNL RV PACING THRESHOLD AMPLITUDE: 1.1 V
MDC IDC MSMT LEADCHNL RV PACING THRESHOLD PULSEWIDTH: 0.4 ms
MDC IDC SET LEADCHNL RA PACING AMPLITUDE: 2 V
MDC IDC SET LEADCHNL RV PACING AMPLITUDE: 1.8 V
MDC IDC SET LEADCHNL RV PACING PULSEWIDTH: 0.4 ms
MDC IDC STAT BRADY RA PERCENT PACED: 81 %
MDC IDC STAT BRADY RV PERCENT PACED: 100 %
Pulse Gen Serial Number: 389736

## 2016-10-28 ENCOUNTER — Encounter: Payer: Self-pay | Admitting: Cardiology

## 2016-11-22 ENCOUNTER — Ambulatory Visit: Payer: Medicare Other | Admitting: Neurology

## 2016-12-12 ENCOUNTER — Ambulatory Visit: Payer: Medicare Other | Admitting: Family Medicine

## 2017-01-12 ENCOUNTER — Ambulatory Visit (INDEPENDENT_AMBULATORY_CARE_PROVIDER_SITE_OTHER): Payer: Medicare Other | Admitting: *Deleted

## 2017-01-12 DIAGNOSIS — I495 Sick sinus syndrome: Secondary | ICD-10-CM

## 2017-01-12 NOTE — Progress Notes (Signed)
Remote pacemaker transmission.   

## 2017-01-13 ENCOUNTER — Encounter: Payer: Self-pay | Admitting: Cardiology

## 2017-01-26 ENCOUNTER — Encounter: Payer: Self-pay | Admitting: Family Medicine

## 2017-01-27 DIAGNOSIS — I442 Atrioventricular block, complete: Secondary | ICD-10-CM | POA: Insufficient documentation

## 2017-01-27 DIAGNOSIS — I10 Essential (primary) hypertension: Secondary | ICD-10-CM | POA: Insufficient documentation

## 2017-01-27 LAB — CUP PACEART REMOTE DEVICE CHECK
Battery Remaining Longevity: 102 mo
Battery Remaining Percentage: 100 %
Date Time Interrogation Session: 20181227052100
Implantable Lead Location: 753859
Implantable Lead Serial Number: 29617326
Implantable Pulse Generator Implant Date: 20150603
Lead Channel Pacing Threshold Pulse Width: 0.4 ms
Lead Channel Pacing Threshold Pulse Width: 0.5 ms
Lead Channel Setting Pacing Amplitude: 2 V
Lead Channel Setting Pacing Pulse Width: 0.4 ms
Lead Channel Setting Sensing Sensitivity: 2.5 mV
MDC IDC LEAD IMPLANT DT: 20150603
MDC IDC LEAD IMPLANT DT: 20150603
MDC IDC LEAD LOCATION: 753860
MDC IDC LEAD SERIAL: 29477746
MDC IDC MSMT LEADCHNL RA IMPEDANCE VALUE: 724 Ohm
MDC IDC MSMT LEADCHNL RA PACING THRESHOLD AMPLITUDE: 0.6 V
MDC IDC MSMT LEADCHNL RV IMPEDANCE VALUE: 595 Ohm
MDC IDC MSMT LEADCHNL RV PACING THRESHOLD AMPLITUDE: 1.4 V
MDC IDC SET LEADCHNL RV PACING AMPLITUDE: 1.9 V
MDC IDC STAT BRADY RA PERCENT PACED: 80 %
MDC IDC STAT BRADY RV PERCENT PACED: 100 %
Pulse Gen Serial Number: 389736

## 2017-03-24 ENCOUNTER — Ambulatory Visit: Payer: Medicare Other | Admitting: Internal Medicine

## 2017-04-13 ENCOUNTER — Ambulatory Visit: Payer: Medicare Other | Admitting: *Deleted

## 2017-04-14 NOTE — Progress Notes (Signed)
Not received  

## 2017-10-31 ENCOUNTER — Encounter: Payer: Self-pay | Admitting: Family Medicine

## 2017-10-31 ENCOUNTER — Ambulatory Visit (INDEPENDENT_AMBULATORY_CARE_PROVIDER_SITE_OTHER): Payer: Medicare Other | Admitting: Family Medicine

## 2017-10-31 VITALS — BP 120/82 | HR 65 | Temp 97.6°F | Ht 74.0 in | Wt 201.6 lb

## 2017-10-31 DIAGNOSIS — E039 Hypothyroidism, unspecified: Secondary | ICD-10-CM

## 2017-10-31 DIAGNOSIS — Z950 Presence of cardiac pacemaker: Secondary | ICD-10-CM

## 2017-10-31 DIAGNOSIS — F3160 Bipolar disorder, current episode mixed, unspecified: Secondary | ICD-10-CM

## 2017-10-31 DIAGNOSIS — R030 Elevated blood-pressure reading, without diagnosis of hypertension: Secondary | ICD-10-CM

## 2017-10-31 DIAGNOSIS — I440 Atrioventricular block, first degree: Secondary | ICD-10-CM | POA: Diagnosis not present

## 2017-10-31 DIAGNOSIS — G20 Parkinson's disease: Secondary | ICD-10-CM

## 2017-10-31 LAB — CBC
HEMATOCRIT: 43.2 % (ref 39.0–52.0)
HEMOGLOBIN: 14.5 g/dL (ref 13.0–17.0)
MCHC: 33.5 g/dL (ref 30.0–36.0)
MCV: 98.2 fl (ref 78.0–100.0)
PLATELETS: 189 10*3/uL (ref 150.0–400.0)
RBC: 4.4 Mil/uL (ref 4.22–5.81)
RDW: 13.9 % (ref 11.5–15.5)
WBC: 6.5 10*3/uL (ref 4.0–10.5)

## 2017-10-31 LAB — TSH: TSH: 3.28 u[IU]/mL (ref 0.35–4.50)

## 2017-10-31 LAB — BASIC METABOLIC PANEL
BUN: 18 mg/dL (ref 6–23)
CHLORIDE: 108 meq/L (ref 96–112)
CO2: 28 meq/L (ref 19–32)
Calcium: 9.6 mg/dL (ref 8.4–10.5)
Creatinine, Ser: 1 mg/dL (ref 0.40–1.50)
GFR: 75.85 mL/min (ref >60.00)
Glucose, Bld: 93 mg/dL (ref 70–99)
POTASSIUM: 3.9 meq/L (ref 3.5–5.1)
SODIUM: 145 meq/L (ref 135–145)

## 2017-10-31 NOTE — Patient Instructions (Addendum)
BEFORE YOU LEAVE: -PHq9 -recheck blood pressure - if still elevated needs follow up in 1 month -lab -follow up: with current PCP or needs to transfer back to here for primary care - should not have multiple primary care providers; AWV with [name]   -We placed a referral for you as discussed to pscyhiatry. It usually takes about 1-2 weeks to process and schedule this referral. If you have not heard from [place] regarding this appointment in 2 weeks please contact our office.  -seek emergency care if any worsening symptoms, thoughts of harm, hallucinations, etc.  We have ordered labs or studies at this visit. It can take up to 1-2 weeks for results and processing. IF results require follow up or explanation, we will call you with instructions. Clinically stable results will be released to your [facility]. If you have not heard from [place] or cannot find your results in [facility] in 2 weeks please contact our office at [phone].  If you are not yet signed up for [facility], please consider signing up.

## 2017-10-31 NOTE — Progress Notes (Signed)
HPI:  Using dictation device. Unfortunately this device frequently misinterprets words/phrases.  Victor Osborne is a pleasant 84 y.o. here for follow up. Chronic medical problems summarized below were reviewed for changes.  Have not seen him in about a year. Was living near [place] and had transferred care there. Now living in ALF in [place], [place] [facility] [place] to be with his wife. He reports her health has been declining. Here today be cause he wants thyroid and tegretol levels. Seeing Neurologist in [place] now for Parkinsons. PA (not even a doctor) managing everything else at the ALF. Reports that is required to live there, but fears they are not very knowledgeable about bipolar disorder. Wants referral to psychiatry in [place] - see below - complicated psych history and was managed by psychiatry in the past. On Tegretol, prozac and seroquel. Denies current SI, thoughts of harm, hallucinations, CP, SOB, DOE. Reports did flu shot already.  Parkinson's disease: -Sees neurology for management, Dr. Carles Collet -on Sinemet  Bipolar disorder: -used to see psychiatry, then moved to ALF in [place] to be with his wife - now they are managing his meds - does not feel like they know what they are doing -feels is rapid cycling, feels depressed at times, manic other times -he wants a tegretol level - reports his psychiatrist always used to check this, but they refuse to check at the facility -[facility] -meds: tegretol (400 noon-2pm, 600 with dinner), prozac, Seroquel -denies current thoughts of harm, hallucination -some thoughts of suicide at times, has plan, but reports would never act on this and feels safe, not frequent and none currently  History of heart block, pacemake, OSA on CPAP, dyslipidemia: -Managed by his cardiologist, Dr. Lovena Le -on fish oil and pravastatin  Hx melanoma: -Managed by his dermatologist  Hypothyroidism: -On Synthroid  Mild  COPD: -asymptomatic   ROS: See pertinent positives and negatives per HPI.  Past Medical History:  Diagnosis Date  . Bipolar 1 disorder ([place])   . COPD (chronic obstructive pulmonary disease) ([place])   . Depression   . First degree AV block   . Hypertension    pt denies but was in medical record  . Hypoglycemia, unspecified   . Myalgia and myositis, unspecified   . Obesity   . Pneumonia 1970  . Skin cancer (melanoma) ([place])   . Thyroid disease   . Unspecified hypothyroidism   . UPJ obstruction, congenital     Past Surgical History:  Procedure Laterality Date  . APPENDECTOMY  1939  . malignant melanoma removed from right forehead  2008  . PERMANENT PACEMAKER INSERTION N/A 06/19/2013   Procedure: PERMANENT PACEMAKER INSERTION;  Surgeon: Evans Lance, MD;  Location: [facility] CATH LAB;  Service: Cardiovascular;  Laterality: N/A;  . right big toe  surgery  2009  . TRANSURETHRAL RESECTION OF PROSTATE  02/04/2011   Procedure: TRANSURETHRAL RESECTION OF THE PROSTATE (TURP);  Surgeon: Dutch Gray, MD;  Location: [facility] ORS;  Service: Urology;  Laterality: N/A;  . URETHRA SURGERY  2010   reconstruction    Family History  Problem Relation Age of Onset  . Cerebral aneurysm Mother   . Lung cancer Father   . Drug abuse Daughter     SOCIAL HX: see hpi   Current Outpatient Medications:  .  alclomethasone (ACLOVATE) 0.05 % ointment, Apply 1 application topically 2 (two) times daily as needed (for rash)., Disp: , Rfl:  .  carbamazepine (TEGRETOL) 200 MG tablet,  Take 200 mg by mouth 5 (five) times daily. , Disp: , Rfl:  .  carbidopa-levodopa (SINEMET IR) 25-100 MG tablet, Take 1 tablet by mouth 3 (three) times daily. (Patient taking differently: Take 2 tablets by mouth 3 (three) times daily. ), Disp: 270 tablet, Rfl: 1 .  cholecalciferol (VITAMIN D) 1000 UNITS tablet, Take 2,000 Units by mouth daily. , Disp: , Rfl:  .  desoximetasone (TOPICORT) 0.25 % cream, Apply 1 application topically 2 (two)  times daily., Disp: , Rfl:  .  Flaxseed, Linseed, (FLAX SEED OIL) 1300 MG CAPS, Take 1 capsule by mouth daily. , Disp: , Rfl:  .  FLUoxetine (PROZAC) 20 MG tablet, Take 20 mg by mouth daily. , Disp: , Rfl:  .  levothyroxine (LEVOTHROID) 125 MCG tablet, Take 1 tablet (125 mcg total) by mouth daily before breakfast., Disp: 90 tablet, Rfl: 3 .  Multiple Vitamin (MULITIVITAMIN WITH MINERALS) TABS, Take 1 tablet by mouth daily., Disp: , Rfl:  .  Omega-3 Fatty Acids (OMEGA-3 CF PO), Take 1,200 mg by mouth daily. , Disp: , Rfl:  .  pravastatin (PRAVACHOL) 40 MG tablet, Take 1 tablet (40 mg total) by mouth daily., Disp: 90 tablet, Rfl: 3 .  SEROQUEL XR 50 MG TB24 24 hr tablet, Take 50 mg by mouth at bedtime., Disp: , Rfl: 3  EXAM:  Vitals:   10/31/17 1330 10/31/17 1417  BP: 120/82 120/82  Pulse: 65   Temp: 97.6 F (36.4 C)     Body mass index is 25.88 kg/m.  GENERAL: vitals reviewed and listed above, alert, oriented, appears well hydrated and in no acute distress  HEENT: atraumatic, conjunttiva clear, no obvious abnormalities on inspection of external nose and ears  NECK: no obvious masses on inspection  LUNGS: clear to auscultation bilaterally, no wheezes, rales or rhonchi, good air movement  CV: HRRR, no peripheral edema  MS: moves all extremities without noticeable abnormality  PSYCH: pleasant and cooperative, no obvious depression or anxiety  ASSESSMENT AND PLAN:  Discussed the following assessment and plan: More than 50% of over 40 minutes spent in total in caring for this patient was spent face-to-face with the patient, counseling and/or coordinating care.    Hypothyroidism, unspecified type - Plan: TSH  Bipolar affective disorder, current episode mixed, current episode severity unspecified ([place]) - Plan: Carbamazepine level, total, Ambulatory referral to Psychiatry  First degree AV block  Pacemaker  Parkinson disease ([place])  Elevated blood pressure reading - Plan:  Basic metabolic panel, CBC  -will do labs he requested, advised morning pre-dose tegratol level - he reports has not taken yet today as usually takes first dose at 2pm, pending seeing psychiatry -did let him know I do no manage bipolar disorder and would not feel comfortable managing his psychiatry medications given complexity, feel he should be under the care of a psychiatrist - referral placed -advised he seek emergency psychiatry care pending psychiatry visit and advised of options, advised to see immediate emergency care if SI, thoughts of harm, hallucinations, severe symptoms or worsening -advised to make a decision about where he wants to do his primary care, but should not have more then one primary care provider -happy to see him back here for thyroid, primary care matters if he decides to transfer care back here -BP much better on recheck -sees neurology for parkinsons -advised assistant to obtain med list, recent provider notes, labs, vaccines from facility and update in epic -Patient advised to return or notify a doctor immediately if  symptoms worsen or persist or new concerns arise.  Patient Instructions  BEFORE YOU LEAVE: -PHq9 -recheck blood pressure - if still elevated needs follow up in 1 month -lab -follow up: with current PCP or needs to transfer back to here for primary care - should not have multiple primary care providers; AWV with [name]   -We placed a referral for you as discussed to pscyhiatry. It usually takes about 1-2 weeks to process and schedule this referral. If you have not heard from [place] regarding this appointment in 2 weeks please contact our office.  -seek emergency care if any worsening symptoms, thoughts of harm, hallucinations, etc.  We have ordered labs or studies at this visit. It can take up to 1-2 weeks for results and processing. IF results require follow up or explanation, we will call you with instructions. Clinically stable results will be released to  your [facility]. If you have not heard from [place] or cannot find your results in [facility] in 2 weeks please contact our office at [phone].  If you are not yet signed up for [facility], please consider signing up.            [name], DO

## 2017-11-01 LAB — CARBAMAZEPINE LEVEL, TOTAL: Carbamazepine Lvl: 7.4 mg/L (ref 4.0–12.0)

## 2017-11-06 ENCOUNTER — Telehealth: Payer: Self-pay | Admitting: Family Medicine

## 2017-11-06 NOTE — Telephone Encounter (Signed)
Pt called for lab results; no result note or CRM created; pt transferred to [place] [place], [facility] per her note dated 10.17/19; unable to chart in result note because no encounter created.

## 2017-12-05 ENCOUNTER — Encounter: Payer: Self-pay | Admitting: Emergency Medicine

## 2017-12-05 DIAGNOSIS — F3175 Bipolar disorder, in partial remission, most recent episode depressed: Secondary | ICD-10-CM | POA: Insufficient documentation

## 2017-12-05 DIAGNOSIS — F4312 Post-traumatic stress disorder, chronic: Secondary | ICD-10-CM

## 2017-12-20 ENCOUNTER — Ambulatory Visit (INDEPENDENT_AMBULATORY_CARE_PROVIDER_SITE_OTHER): Payer: Medicare Other | Admitting: Psychiatry

## 2017-12-20 ENCOUNTER — Encounter: Payer: Self-pay | Admitting: Psychiatry

## 2017-12-20 VITALS — BP 126/78 | HR 72 | Ht 74.0 in | Wt 191.0 lb

## 2017-12-20 DIAGNOSIS — F4312 Post-traumatic stress disorder, chronic: Secondary | ICD-10-CM

## 2017-12-20 DIAGNOSIS — F3175 Bipolar disorder, in partial remission, most recent episode depressed: Secondary | ICD-10-CM

## 2017-12-20 NOTE — Progress Notes (Signed)
[facility] Med Check  Patient ID: Victor Osborne,  MRN: [id number]  PCP: Victor Kern, DO  Date of Evaluation: 12/20/2017 Time spent:20 minutes  Chief Complaint:  Chief Complaint    Anxiety; Depression      HISTORY/CURRENT STATUS: Victor Osborne is seen individually face-to-face with consent with epic collateral for psychiatric interview and exam in 29-month hydration and management of bipolar type I, PTSD, and multiple medical and psychosocial stressful comorbidities.  Wife does not attend today as she has more difficulty with her Parkinson's leaving the assisted care living where they reside.  She will likely need nursing home placement soon, and he discusses options for an Alzheimer's facility program versus others but expects he will have to live independently separately from her when that happens but he has always stayed with her as long as he could.  He is concerned that he has lost 10 to 20 pounds measured as 12 pounds in the last 6 months here stating that his double dose of carbidopa for his Parkinson's is now causing some depression as well as some cloudiness and fatigue.  He had recent assessment for hypothyroidism and required his primary care physicians to order a Tegretol level as he was concerned his level might be too low allowing his bipolar disorder to exacerbate.  Tegretol level was 7.4 with reference range 4-12 and CBC was normal.  Liver functions were not performed, but TSH was normal at 3.28.  He predominately needs to talk today about losses including a nephew using heroin and only 1 daughter locally supportive as others have more stressors but the one locally is having problems on the job.  He brings his wife to the office here to see Dr. Clovis Osborne in 1 week and try to coordinate times that he and she both attend appointments.  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  He has a cardiac pacemaker, some COPD, and parkinsonism.  Anxiety  Presents for follow-up visit. Symptoms include confusion, decreased concentration, depressed mood, excessive worry, insomnia, irritability and nervous/anxious behavior. Patient reports no dizziness, hyperventilation, muscle tension, restlessness, shortness of breath or suicidal ideas. Symptoms occur most days. The most recent episode lasted 30 minutes. The severity of symptoms is moderate. The patient sleeps 5 hours per night. The quality of sleep is fair. Nighttime awakenings: occasional.   His past medical history is significant for depression. There is no history of suicide attempts. Compliance with medications is 76-100%.  Depression       The patient presents with depression.  This is a recurrent problem.  The current episode started more than 1 year ago.   The onset quality is sudden.   The problem occurs intermittently.  The most recent episode lasted 3 weeks.    The problem has been waxing and waning since onset.  Associated symptoms include decreased concentration, insomnia, irritable, decreased interest, appetite change and sad.  Associated symptoms include no restlessness, no body aches, no headaches and no suicidal ideas.     The symptoms are aggravated by family issues and social issues.  Past treatments include SSRIs - Selective serotonin reuptake inhibitors and other medications.  Compliance with treatment  is good.  Past compliance problems include difficulty with treatment plan and medication issues.  Previous treatment provided moderate relief.  Risk factors include a change in medication usage/dosage, prior traumatic experience, stress, family history of mental illness, family history, major life  event and prior psychiatric admission.   Past medical history includes chronic pain, chronic illness, physical disability, anxiety, bipolar disorder, depression, mental health disorder and post-traumatic stress disorder.     Pertinent negatives include no thyroid problem, no recent psychiatric admission, no Alzheimer's disease, no brain trauma, no dementia, no eating disorder, no obsessive-compulsive disorder, no schizophrenia, no suicide attempts and no head trauma.   Individual Medical History/ Review of Systems: Changes? :Yes Labs are reviewed from 10/31/2017 as well as his ongoing cardiology, pulmonary, primary care.  Allergies: Patient has no known allergies.  Current Medications:  Current Outpatient Medications:  .  alclomethasone (ACLOVATE) 0.05 % ointment, Apply 1 application topically 2 (two) times daily as needed (for rash)., Disp: , Rfl:  .  carbamazepine (TEGRETOL) 200 MG tablet, Take 200 mg by mouth 5 (five) times daily. , Disp: , Rfl:  .  carbidopa-levodopa (SINEMET IR) 25-100 MG tablet, Take 1 tablet by mouth 3 (three) times daily. (Patient taking differently: Take 2 tablets by mouth 3 (three) times daily. ), Disp: 270 tablet, Rfl: 1 .  cholecalciferol (VITAMIN D) 1000 UNITS tablet, Take 2,000 Units by mouth daily. , Disp: , Rfl:  .  desoximetasone (TOPICORT) 0.25 % cream, Apply 1 application topically 2 (two) times daily., Disp: , Rfl:  .  Flaxseed, Linseed, (FLAX SEED OIL) 1300 MG CAPS, Take 1 capsule by mouth daily. , Disp: , Rfl:  .  FLUoxetine (PROZAC) 20 MG tablet, Take 20 mg by mouth daily. , Disp: , Rfl:  .  levothyroxine (LEVOTHROID) 125 MCG tablet, Take 1 tablet (125 mcg total) by mouth daily before breakfast., Disp: 90 tablet, Rfl: 3 .  Multiple Vitamin (MULITIVITAMIN WITH MINERALS) TABS, Take 1 tablet by mouth daily., Disp: , Rfl:  .  Omega-3 Fatty Acids (OMEGA-3 CF PO), Take 1,200 mg by mouth daily. , Disp: , Rfl:  .  pravastatin (PRAVACHOL) 40 MG tablet, Take 1 tablet  (40 mg total) by mouth daily., Disp: 90 tablet, Rfl: 3 .  SEROQUEL XR 50 MG TB24 24 hr tablet, Take 50 mg by mouth at bedtime., Disp: , Rfl: 3 Medication Side Effects: none  Family Medical/ Social History: Changes? Yes wife can barely talk having prolonged Parkinson's delay in a thought becoming the statement and a response  MENTAL HEALTH EXAM: Muscle strength 5/5, postural reflexes -2/-2, and AIMS equals 0 Blood pressure 126/78, pulse 72, height 6\' 2"  (1.88 m), weight 191 lb (86.6 kg).Body mass index is 24.52 kg/m.  General Appearance: Casual, Disheveled and Guarded  Eye Contact:  Good  Speech:  Normal Rate and Slurred  Volume:  Normal to increased  Mood:  Anxious and Dysphoric  Affect:  Labile and Full Range  Thought Process:  Coherent and Linear  Orientation:  Full (Time, Place, and Person)  Thought Content: Obsessions and Rumination   Suicidal Thoughts:  No  Homicidal Thoughts:  No  Memory:  Immediate;   Fair Remote;   Good  Judgement:  Fair  Insight:  Fair  Psychomotor Activity:  Normal  Concentration:  Concentration: Fair and Attention Span: Fair  Recall:  Good  Fund of Knowledge: Good  Language: Good  Assets:  Desire for Improvement Resilience Talents/Skills  ADL's:  Intact  Cognition: WNL  Prognosis:  Fair    DIAGNOSES:    ICD-10-CM   1. Bipolar I disorder, current or most recent episode depressed, in partial remission ([place]) F31.75   2. Chronic post-traumatic stress disorder F43.12     Receiving Psychotherapy: No    RECOMMENDATIONS: Over 50% of the time is spent in counseling and coordination of care addressing past trauma, course of bipolar, health problems, and decline of wife's ability to participate in their relationship especially for the volunteer activities he found so rewarding.  He improves in mood and interest as he functions in the session psychosocially and cognitively.  We discussed the option of increasing Seroquel Prozac though currently with his  higher dose of carbidopa all of the stressors, the long term medication doses are currently best can be adjusted by being his staff from assisted care living phone me.  They do provide his prescription refills as he continues carbamazepine 200 mg taking 2 tablets noon and supper and 1 tablet at bedtime for bipolar disorder.  He continues Seroquel 50 mg IR nightly at bedtime for bipolar and PTSD.  He continues fluoxetine 20 mg every morning primarily for PTSD but also bipolar depression.  He returns in 6 months or sooner if needed and will likely see him when wife attends her appointment with Dr. Clovis Osborne next week.   Delight Hoh, MD

## 2018-02-22 ENCOUNTER — Telehealth: Payer: Self-pay | Admitting: Psychiatry

## 2018-02-22 NOTE — Telephone Encounter (Signed)
Phone call from Rise Mu [phone] with [facility] as patient request for providers and management at the facility for he and wife to take over the total care for wife that he has been providing.  Mr. Rosemond request Tilda Burrow establish a working relationship with myself for his medication and she seeks to not wait until his appointment June 11 but managed by phone is increasing depression more than anxiety as his wife gets worse in her parkinsonism so that he is overwhelmed including for taking care of his own.  They will assure that neurology agrees that current motional decompensation is not from the levo and carbidopa he takes though I clarify that Parkinson's individuals may have primary or secondary depression.  I order his Prozac 20 mg tablet to be increased to 1-1/2 tablets every morning if neurology and primary care agree.

## 2018-06-28 ENCOUNTER — Ambulatory Visit: Payer: Medicare Other | Admitting: Psychiatry

## 2018-06-28 ENCOUNTER — Encounter: Payer: Self-pay | Admitting: Psychiatry

## 2018-06-28 ENCOUNTER — Other Ambulatory Visit: Payer: Self-pay

## 2018-06-28 ENCOUNTER — Ambulatory Visit (INDEPENDENT_AMBULATORY_CARE_PROVIDER_SITE_OTHER): Payer: Medicare Other | Admitting: Psychiatry

## 2018-06-28 DIAGNOSIS — F3175 Bipolar disorder, in partial remission, most recent episode depressed: Secondary | ICD-10-CM

## 2018-06-28 DIAGNOSIS — F4312 Post-traumatic stress disorder, chronic: Secondary | ICD-10-CM

## 2018-06-28 DIAGNOSIS — G4733 Obstructive sleep apnea (adult) (pediatric): Secondary | ICD-10-CM

## 2018-06-28 NOTE — Progress Notes (Signed)
[facility]  Patient ID: Victor Osborne,  MRN: [id number]  PCP: [name], DO  Date of Evaluation: 06/28/2018 Time spent:15 minutes from 1145 to 1200  Chief Complaint:  Chief Complaint    Depression; Manic Behavior; Anxiety; Trauma      HISTORY/CURRENT STATUS: Rush Landmark is provided telemedicine audiovisual appointment session, declining the video for his history of PTSD, individually with consent with epic collateral a couple of hours after similar appointment for his wife Lelon Frohlich with Dr. Clovis Pu in this office for psychiatric interview and exam in 81-month evaluation and management of bipolar 1, PTSD, and major psychosocial and medical stressors.  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  [place] terminated his disability coverage during his care here. He had provided total care for wife who has Parkinson's more severe than himself now until needing [facility] in [place] for assisted living patient fearful that wife will need skilled care soon which with separate them as he will not qualify for similar placement.  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  Patient has much guilt for hypomanic flirting with other females that greatly disturbed his wife even after leaving [place].  Despite age and medical consequences, he has required ongoing now low-dose Seroquel and high-dose Tegretol to keep mood stable recently requiring increasing Prozac and addition of Remeron.  He is now transferring all prescribing to their visiting physician staff as he is quarantined at assisted living.  Still he benefits from psychotherapeutic review here as well as my phone collaboration with prescribing providers for patient ongoing mental health management.  He is not currently manic, suicidal, or psychotic though patient continues to gradually improve  now.   Depression       The patient presents with depression as a recurrent problem.  The current episode started more than 1 month ago.   The onset quality is sudden occuring intermittently.  The most recent episode lasted months. The problem has been waxing and waning since onset.  Associated symptoms include decreased concentration, morbid guilt, insomnia, irritable, decreased interest, appetite change and sad.  Associated symptoms include no restlessness, no body aches, no headaches and no suicidal ideas.     The symptoms are aggravated by family issues and social issues.  Past treatments include SSRIs - Selective serotonin reuptake inhibitors and other medications.  Compliance with treatment is good.  Past compliance problems include difficulty with treatment plan and medication issues.  Previous treatment provided moderate relief.  Risk factors include a change in medication usage/dosage, prior traumatic experience, stress, family history of mental illness, family history, major life event and prior psychiatric admission.   Past medical history includes chronic pain, chronic illness, physical disability, anxiety, bipolar disorder, depression, mental health disorder and post-traumatic stress disorder.     Pertinent negatives include no thyroid problem, no recent psychiatric admission, no Alzheimer's disease, no brain trauma, no dementia, no eating disorder, no obsessive-compulsive disorder, no schizophrenia, no suicide attempts and no head trauma.  Individual Medical History/ Review of Systems: Changes? :Yes Regaining 16 pounds after having lost 12 pounds as of last visit started mirtazapine 15 mg nightly on 05/09/2018 by Lesia Hausen at [facility] after increasing Prozac from 20 to 30 mg by intervention with the [facility] 02/23/2018  Allergies: Patient has no known allergies.  Current Medications:  Current Outpatient Medications:  .  FLUoxetine (PROZAC) 20 MG tablet,  Take 30 mg by mouth daily.,  Disp: , Rfl:  .  alclomethasone (ACLOVATE) 0.05 % ointment, Apply 1 application topically 2 (two) times daily as needed (for rash)., Disp: , Rfl:  .  carbamazepine (TEGRETOL) 200 MG tablet, Take 200 mg by mouth 5 (five) times daily. , Disp: , Rfl:  .  carbidopa-levodopa (SINEMET IR) 25-100 MG tablet, Take 1 tablet by mouth 3 (three) times daily. (Patient taking differently: Take 2 tablets by mouth 3 (three) times daily. ), Disp: 270 tablet, Rfl: 1 .  cholecalciferol (VITAMIN D) 1000 UNITS tablet, Take 2,000 Units by mouth daily. , Disp: , Rfl:  .  desoximetasone (TOPICORT) 0.25 % cream, Apply 1 application topically 2 (two) times daily., Disp: , Rfl:  .  Flaxseed, Linseed, (FLAX SEED OIL) 1300 MG CAPS, Take 1 capsule by mouth daily. , Disp: , Rfl:  .  levothyroxine (LEVOTHROID) 125 MCG tablet, Take 1 tablet (125 mcg total) by mouth daily before breakfast., Disp: 90 tablet, Rfl: 3 .  Multiple Vitamin (MULITIVITAMIN WITH MINERALS) TABS, Take 1 tablet by mouth daily., Disp: , Rfl:  .  Omega-3 Fatty Acids (OMEGA-3 CF PO), Take 1,200 mg by mouth daily. , Disp: , Rfl:  .  pravastatin (PRAVACHOL) 40 MG tablet, Take 1 tablet (40 mg total) by mouth daily., Disp: 90 tablet, Rfl: 3 .  SEROQUEL XR 50 MG TB24 24 hr tablet, Take 50 mg by mouth at bedtime., Disp: , Rfl: 3   Medication Side Effects: none  Family Medical/ Social History: Changes? Yes whereas he previously helped his children and their children, he is now confined to the assisted living caring for wife.  They have been married for 66 years.  MENTAL HEALTH EXAM:  There were no vitals taken for this visit.There is no height or weight on file to calculate BMI.  On 06/05/2018 [facility], was 207 pounds for height 76 inches and BP was 132/74 mm Hg with HR 70 bpm  General Appearance: N/A  Eye Contact:  N/A  Speech:  Clear and Coherent, Normal Rate and Talkative  Volume:  Normal  Mood:  Anxious, Dysphoric, Euthymic, Hopeless and Worthless   Affect:  Depressed, Full Range and Anxious  Thought Process:  Goal Directed and Irrelevant  Orientation:  Full (Time, Place, and Person)  Thought Content: Obsessions and Rumination   Suicidal Thoughts:  No  Homicidal Thoughts:  No  Memory:  Immediate;   Good Remote;   Good  Judgement:  Fair  Insight:  Good  Psychomotor Activity:  Normal, Mannerisms and Restlessness  Concentration:  Concentration: Good and Attention Span: Good  Recall:  [place] of Knowledge: Good  Language: Good  Assets:  Desire for Improvement Resilience Talents/Skills  ADL's:  Intact  Cognition: WNL  Prognosis:  Fair    DIAGNOSES:    ICD-10-CM   1. Bipolar I disorder, current or most recent episode depressed, in partial remission ([place])  F31.75   2. Chronic post-traumatic stress disorder  F43.12   3. Obstructive sleep apnea  G47.33     Receiving Psychotherapy: No    RECOMMENDATION: Medication supply relative monitoring of dosing is at [facility] with staff and providers there.  He takes quetiapine 50 mg XR every bedtime for bipolar 1 and PTSD.  Carbamazepine 200 mg tablet is dosed as 1 tablet 5 times daily previously as 2 in the morning late afternoon and 1 at bedtime for bipolar.  Fluoxetine is 20 mg tablet taking 1.5 tablets total 30 mg  every morning for PTSD and bipolar depression.  Mirtazapine 15 mg is 1 every bedtime for PTSD, bipolar, and insomnia.  He has pacemaker and is monitored for weight and metabolics for follow-up in 6 months or as needed.  Virtual Visit via Video Note  I connected with [name] on 06/28/18 at 11:40 AM EDT by a video enabled telemedicine application and verified that I am speaking with the correct person using two identifiers.  Location: Patient: Individually at [facility] Provider: [facility]   I discussed the limitations of evaluation and management by telemedicine and the availability of in person appointments.  The patient expressed understanding and agreed to proceed.  History of Present Illness:  80-month evaluation and management of bipolar 1, PTSD, and major psychosocial and medical stressors.  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   Observations/Objective: Mood:  Anxious, Dysphoric, Euthymic, Hopeless and Worthless  Affect:  Depressed, Full Range and Anxious  Thought Process:  Goal Directed and Irrelevant  Orientation:  Full (Time, Place, and Person)  Thought Content: Obsessions and Rumination    Assessment and Plan: He takes quetiapine 50 mg XR every bedtime for bipolar 1 and PTSD.  Carbamazepine 200 mg tablet is dosed as 1 tablet 5 times daily previously as 2 in the morning late afternoon and 1 at bedtime for bipolar.  Fluoxetine is 20 mg tablet taking 1.5 tablets total 30 mg every morning for PTSD and bipolar depression.  Mirtazapine 15 mg is 1 every bedtime for PTSD, bipolar, and insomnia.   Follow Up Instructions: He has pacemaker and is monitored for weight and metabolics for follow-up in 6 months or as needed.   I discussed the assessment and treatment plan with the patient. The patient was provided an opportunity to ask questions and all were answered. The patient agreed with the plan and demonstrated an understanding of the instructions.   The patient was advised to call back or seek an in-person evaluation if the symptoms worsen or if the condition fails to improve as anticipated.  I provided 15 minutes of non-face-to-face time during this encounter. [employer] meeting #[id number] Meeting password: [email or web address]  Delight Hoh, MD  Delight Hoh, MD

## 2018-12-06 ENCOUNTER — Ambulatory Visit: Payer: Medicare Other | Admitting: Psychiatry

## 2018-12-10 ENCOUNTER — Encounter: Payer: Self-pay | Admitting: Psychiatry

## 2018-12-10 ENCOUNTER — Other Ambulatory Visit: Payer: Self-pay

## 2018-12-10 ENCOUNTER — Ambulatory Visit (INDEPENDENT_AMBULATORY_CARE_PROVIDER_SITE_OTHER): Payer: Medicare Other | Admitting: Psychiatry

## 2018-12-10 VITALS — Ht 76.0 in | Wt 176.0 lb

## 2018-12-10 DIAGNOSIS — G20 Parkinson's disease: Secondary | ICD-10-CM

## 2018-12-10 DIAGNOSIS — E032 Hypothyroidism due to medicaments and other exogenous substances: Secondary | ICD-10-CM

## 2018-12-10 DIAGNOSIS — F3175 Bipolar disorder, in partial remission, most recent episode depressed: Secondary | ICD-10-CM | POA: Diagnosis not present

## 2018-12-10 DIAGNOSIS — F4312 Post-traumatic stress disorder, chronic: Secondary | ICD-10-CM

## 2018-12-10 DIAGNOSIS — I21A9 Other myocardial infarction type: Secondary | ICD-10-CM

## 2018-12-10 DIAGNOSIS — I440 Atrioventricular block, first degree: Secondary | ICD-10-CM

## 2018-12-10 MED ORDER — FLUOXETINE HCL 20 MG PO TABS
20.0000 mg | ORAL_TABLET | Freq: Every day | ORAL | 1 refills | Status: DC
Start: 2018-12-10 — End: 2019-02-25

## 2018-12-10 MED ORDER — QUETIAPINE FUMARATE ER 50 MG PO TB24: 50.0000 mg | ORAL_TABLET | Freq: Every day | ORAL | 1 refills | Status: DC

## 2018-12-10 MED ORDER — CARBAMAZEPINE 200 MG PO TABS: 400.0000 mg | ORAL_TABLET | Freq: Two times a day (BID) | ORAL | 1 refills | Status: DC

## 2018-12-10 NOTE — Progress Notes (Signed)
[facility]  Patient ID: Victor Osborne,  MRN: [id number]  PCP: Patient, No Pcp Per  Date of Evaluation: 12/10/2018 Time spent:20 minutes from 0955 to 1015  Chief Complaint:  Chief Complaint    Manic Behavior; Depression; Agitation; Anxiety; Trauma      HISTORY/CURRENT STATUS: Rush Landmark is seen onsite in office 20 minutes face-to-face individually with consent with epic collateral for psychiatric interview and exam in 54-month evaluation and management of bipolar 1 depressed, PTSD, and medical comorbidities including history of hypothyroidism possibly medication related and cardiac pacemaker for first-degree block and SA node dysfunction likely ischemic.  Patient has moved out of the [facility] care center in [place] after the death of his wife 09/22/18 when staff at the care center were not dosing his medication correctly.  Particularly his Parkinson's medication levodopa/carbidopa was in someway doubled so that his supply ran out but he lost a great deal of weight now regaining somewhat and feeling and functioning better.  He has moved to the [facility] in [place] as an assisted living facility where they send medical staff by to check on him but he is expected to dose his own medications and comes today for resuming that process through here for his bipolar and PTSD medications which include Seroquel XR, Prozac, and Tegretol.  The medication list he brings from his current dosing at home list has Prozac is 20 mg not 30 mg daily, Tegretol is still 5 doses of 200 mg daily, and Seroquel 50 mgXR nightly.  He still takes levothyroxine 125 mcg (0.125 mg) daily as well as pravastatin 40 mg daily so that his lipids are checked as well as his Parkinson's medications by medical services.  He notes that he has a cardiologist Dr. Lovena Le and pulmonary Dr. Annamaria Boots.  He is concerned that his CBC and carbamazepine levels have not been checked since last year 11/05/2018 when the CBZ level was  7.4 and WBC 6500.  Currently his weight is down 30 pounds from last appointment here 5 months ago.  He hopes to find an [facility] primary care physician as advised by his current placement.  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  He comes here seeking to get his labs updated and his medications renewed  with  [facility] for his mental health needs.  His cell phone number is [phone] relative to calling results.  He has no current mania, psychosis, suicidality, or delirium.  Depression  The patient presents withdepression as a recurrentproblem with current episode starting more than 3 month ago. The onset quality is sudden occuring intermittently.The most recent episode lasted months. The problem has been waxing and waningsince onset.Associated symptoms include decreased concentration,morbid guilt, insomnia,appetite changeand sad. Associated symptoms include no restlessness,noirritability,no decreased interest, no body aches,no headachesand no suicidal ideas.The symptoms are aggravated by family issues and social issues.Past treatments include SSRIs - Selective serotonin reuptake inhibitors and other medications.Compliance with treatment is good.Past compliance problems include difficulty with treatment plan and medication issues.Previous treatment provided moderaterelief.Risk factors include a change in medication usage/dosage, prior traumatic experience, stress, family history of mental illness, family history, major life event and prior psychiatric admission. Past medical history includes chronic pain,chronic illness,physical disability,anxiety,bipolar disorder,depression,mental health disorderand post-traumatic stress disorder. Pertinent negatives include no thyroid problem,no recent psychiatric admission,no Alzheimer's disease,no brain trauma,no dementia,no eating disorder,no  obsessive-compulsive disorder,no schizophrenia,no suicide attemptsand no head trauma.  Individual Medical History/ Review of  Systems: Changes? :Yes Patient's first-degree AV block and SA node dysfunction requiring pacemaker are likely ischemic in origin.  Hypothyroidism may be medication induced historically.  He has COPD requiring Ventolin inhaler.  His cholesterol has been elevated in the past but his weight is now down 30 pounds still taking pravastatin 40 mg daily likely checked by cardiology.  Neurology still manages his levodopa/carbidopa as his most challenging medication to clarify with [facility] which he worked on a couple of days ago.  Allergies: Patient has no known allergies.  Current Medications:  Current Outpatient Medications:  .  alclomethasone (ACLOVATE) 0.05 % ointment, Apply 1 application topically 2 (two) times daily as needed (for rash)., Disp: , Rfl:  .  carbamazepine (TEGRETOL) 200 MG tablet, Take 2 tablets (400 mg total) by mouth 2 (two) times daily., Disp: 360 tablet, Rfl: 1 .  carbidopa-levodopa (SINEMET IR) 25-100 MG tablet, Take 1 tablet by mouth 3 (three) times daily. (Patient taking differently: Take 2 tablets by mouth 3 (three) times daily. ), Disp: 270 tablet, Rfl: 1 .  cholecalciferol (VITAMIN D) 1000 UNITS tablet, Take 2,000 Units by mouth daily. , Disp: , Rfl:  .  desoximetasone (TOPICORT) 0.25 % cream, Apply 1 application topically 2 (two) times daily., Disp: , Rfl:  .  Flaxseed, Linseed, (FLAX SEED OIL) 1300 MG CAPS, Take 1 capsule by mouth daily. , Disp: , Rfl:  .  FLUoxetine (PROZAC) 20 MG tablet, Take 1 tablet (20 mg total) by mouth daily., Disp: 90 tablet, Rfl: 1 .  levothyroxine (LEVOTHROID) 125 MCG tablet, Take 1 tablet (125 mcg total) by mouth daily before breakfast., Disp: 90 tablet, Rfl: 3 .  Multiple Vitamin (MULITIVITAMIN WITH MINERALS) TABS, Take 1 tablet by mouth daily., Disp: , Rfl:  .  Omega-3 Fatty Acids (OMEGA-3 CF PO), Take 1,200 mg by mouth  daily. , Disp: , Rfl:  .  pravastatin (PRAVACHOL) 40 MG tablet, Take 1 tablet (40 mg total) by mouth daily., Disp: 90 tablet, Rfl: 3 .  QUEtiapine (SEROQUEL XR) 50 MG TB24 24 hr tablet, Take 1 tablet (50 mg total) by mouth at bedtime., Disp: 90 tablet, Rfl: 1   Medication Side Effects: He worries about neutropenia on Tegretol for so many years though normal last October  Family Medical/ Social History: Changes? Yes wife died at [facility] 2018-10-04  [place]:  Height 6\' 4"  (1.93 m), weight 176 lb (79.8 kg).Body mass index is 21.42 kg/m.  Otherwise deferred for coronavirus shutdown except Muscle strengths and tone 5/5, postural reflexes -2/-2, and gait -1/-1, and AIMS = 0 today with no parkinsonian tremor evident on his medication today.             General Appearance: Casual, Fairly Groomed and Meticulous  Eye Contact:  Good  Speech:  Clear and Coherent, Normal Rate, Pressured and Talkative  Volume:  Normal  Mood:  Anxious, Depressed, Dysphoric, Euphoric and Euthymic  Affect:  Congruent, Depressed, Inappropriate, Labile, Full Range and Anxious  Thought Process:  Coherent, Goal Directed and Descriptions of Associations: Tangential  Orientation:  Full (Time, Place, and Person)  Thought Content: Ilusions, Rumination and Tangential   Suicidal Thoughts:  No  Homicidal Thoughts:  No  Memory:  Immediate;   Good Remote;   Good  Judgement:  Good  Insight:  Good  Psychomotor Activity:  Normal, Increased, Decreased and Mannerisms  Concentration:  Concentration: Fair and Attention Span: Good  Recall:  Good  Fund of Knowledge: Good  Language: Good  Assets:  Desire for Improvement Leisure Time Resilience Talents/Skills  ADL's:  Intact  Cognition: WNL  Prognosis:  Fair    DIAGNOSES:    ICD-10-CM   1. Bipolar I disorder, current or most recent episode depressed, in partial remission ([facility])  F31.75 Carbamazepine, Free and Total    Hemoglobin A1c    Comprehensive metabolic  panel    TSH    CBC with Differential/Platelet    carbamazepine (TEGRETOL) 200 MG tablet    FLUoxetine (PROZAC) 20 MG tablet    QUEtiapine (SEROQUEL XR) 50 MG TB24 24 hr tablet  2. Chronic post-traumatic stress disorder  F43.12 FLUoxetine (PROZAC) 20 MG tablet  3. Parkinson disease ([place])  G20   4. Other myocardial infarction type ([place])   I21.A9 Hemoglobin A1c  5. First degree AV block  I44.0 Hemoglobin A1c  6. Hypothyroidism due to medication  E03.2 Hemoglobin A1c    Receiving Psychotherapy: No    RECOMMENDATIONS: Patient agrees to reduce carbamazepine by 1 tablet or 200 mg daily and maintain his Prozac at 20 instead of the previous 30 mg daily while Seroquel is unchanged.  Mood today is mildly depressed tickly evident as we discussed the loss of wife but he hates the session as psychosupportive psychoeducation grief and loss trauma focused frustration management to allow symptom treatment matching medications to achieve prevention and monitoring and safety hygiene.  An order is provided to [facility] and written case he prefers to get it done with his new [facility] primary care physician if they can for hemoglobin A1c relative to metabolic risk of his Seroquel when previously hypothyroid, TSH, chemistry 19 panel, carbamazepine level free and total, and CBC relative to any carbamazepine neutropenia v79.899.  He is E scribed to [facility] Prozac 20 mg every morning #90 with 1 refill for PTSD and bipolar depression, Seroquel 50 mg XR every bedtime #90 with 1 refills for bipolar disorder, and carbamazepine 200 mg decreased to take 2 tablets twice daily for bipolar disorder sent as #360 with 1 refill.  He agrees to return in 6 months or sooner if needed and I will phone him the lab results when he gets them as he is still driving carefully on good days like today.  We process the loss of his spouse in August for completed mourning as possible.  Delight Hoh, MD

## 2018-12-12 ENCOUNTER — Encounter: Payer: Self-pay | Admitting: Internal Medicine

## 2018-12-12 ENCOUNTER — Ambulatory Visit (INDEPENDENT_AMBULATORY_CARE_PROVIDER_SITE_OTHER): Payer: Medicare Other | Admitting: *Deleted

## 2018-12-12 ENCOUNTER — Ambulatory Visit (INDEPENDENT_AMBULATORY_CARE_PROVIDER_SITE_OTHER): Payer: Medicare Other | Admitting: Internal Medicine

## 2018-12-12 ENCOUNTER — Other Ambulatory Visit: Payer: Self-pay

## 2018-12-12 VITALS — BP 118/64 | HR 60 | Ht 76.0 in | Wt 177.0 lb

## 2018-12-12 DIAGNOSIS — I495 Sick sinus syndrome: Secondary | ICD-10-CM | POA: Diagnosis not present

## 2018-12-12 DIAGNOSIS — Z950 Presence of cardiac pacemaker: Secondary | ICD-10-CM | POA: Diagnosis not present

## 2018-12-12 NOTE — Progress Notes (Signed)
HPI Victor Osborne returns today for ongoing evaluation and management of his permanent pacemaker status post insertion over 5 years ago. The patient is a very pleasant 85 year old man who has symptomatic sinus node dysfunction. He has done well in the interim. He denies chest pain or shortness of breath. No syncope. No peripheral edema. He has bothered by a resting tremor.  No Known Allergies   Current Outpatient Medications  Medication Sig Dispense Refill  . alclomethasone (ACLOVATE) 0.05 % ointment Apply 1 application topically 2 (two) times daily as needed (for rash).    . carbamazepine (TEGRETOL) 200 MG tablet Take 2 tablets (400 mg total) by mouth 2 (two) times daily. 360 tablet 1  . carbidopa-levodopa (SINEMET IR) 25-100 MG tablet Take 1 tablet by mouth 3 (three) times daily. (Patient taking differently: Take 2 tablets by mouth 3 (three) times daily. ) 270 tablet 1  . cholecalciferol (VITAMIN D) 1000 UNITS tablet Take 2,000 Units by mouth daily.     Marland Kitchen desoximetasone (TOPICORT) 0.25 % cream Apply 1 application topically 2 (two) times daily.    . Flaxseed, Linseed, (FLAX SEED OIL) 1300 MG CAPS Take 1 capsule by mouth daily.     Marland Kitchen FLUoxetine (PROZAC) 20 MG tablet Take 1 tablet (20 mg total) by mouth daily. 90 tablet 1  . levothyroxine (LEVOTHROID) 125 MCG tablet Take 1 tablet (125 mcg total) by mouth daily before breakfast. 90 tablet 3  . Multiple Vitamin (MULITIVITAMIN WITH MINERALS) TABS Take 1 tablet by mouth daily.    . Omega-3 Fatty Acids (OMEGA-3 CF PO) Take 1,200 mg by mouth daily.     . pravastatin (PRAVACHOL) 40 MG tablet Take 1 tablet (40 mg total) by mouth daily. 90 tablet 3  . QUEtiapine (SEROQUEL XR) 50 MG TB24 24 hr tablet Take 1 tablet (50 mg total) by mouth at bedtime. 90 tablet 1   No current facility-administered medications for this visit.      Past Medical History:  Diagnosis Date  . Bipolar 1 disorder ([place])   . COPD (chronic obstructive pulmonary disease)  ([place])   . Depression   . First degree AV block   . Hypertension    pt denies but was in medical record  . Hypoglycemia, unspecified   . Myalgia and myositis, unspecified   . Obesity   . Pneumonia 1970  . Skin cancer (melanoma) ([place])   . Thyroid disease   . Unspecified hypothyroidism   . UPJ obstruction, congenital     ROS:   All systems reviewed and negative except as noted in the HPI.   Past Surgical History:  Procedure Laterality Date  . APPENDECTOMY  1939  . malignant melanoma removed from right forehead  2008  . PERMANENT PACEMAKER INSERTION N/A 06/19/2013   Procedure: PERMANENT PACEMAKER INSERTION;  Surgeon: Evans Lance, MD;  Location: [facility] CATH LAB;  Service: Cardiovascular;  Laterality: N/A;  . right big toe  surgery  2009  . TRANSURETHRAL RESECTION OF PROSTATE  02/04/2011   Procedure: TRANSURETHRAL RESECTION OF THE PROSTATE (TURP);  Surgeon: Dutch Gray, MD;  Location: [facility] ORS;  Service: Urology;  Laterality: N/A;  . URETHRA SURGERY  2010   reconstruction     Family History  Problem Relation Age of Onset  . Cerebral aneurysm Mother   . Lung cancer Father   . Drug abuse Daughter      Social History   Socioeconomic History  . Marital status: Widowed    Spouse name:  Not on file  . Number of children: Not on file  . Years of education: Not on file  . Highest education level: Not on file  Occupational History  . Occupation: retired  Scientific laboratory technician  . Financial resource strain: Not on file  . Food insecurity    Worry: Not on file    Inability: Not on file  . Transportation needs    Medical: Not on file    Non-medical: Not on file  Tobacco Use  . Smoking status: Former Smoker    Packs/day: 2.00    Years: 40.00    Pack years: 80.00    Types: Cigarettes  . Smokeless tobacco: Never Used  Substance and Sexual Activity  . Alcohol use: No  . Drug use: No  . Sexual activity: Not Currently  Lifestyle  . Physical activity    Days per week: Not on file     Minutes per session: Not on file  . Stress: Not on file  Relationships  . Social Herbalist on phone: Not on file    Gets together: Not on file    Attends religious service: Not on file    Active member of club or organization: Not on file    Attends meetings of clubs or organizations: Not on file    Relationship status: Not on file  . Intimate partner violence    Fear of current or ex partner: Not on file    Emotionally abused: Not on file    Physically abused: Not on file    Forced sexual activity: Not on file  Other Topics Concern  . 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      Spiritual Beliefs: episcopalian      Lifestyle: active with yard work; diet ok     BP 118/64   Pulse 60   Ht 6\' 4"  (1.93 m)   Wt 177 lb (80.3 kg)   SpO2 95%   BMI 21.55 kg/m   Physical Exam:  Well appearing NAD HEENT: Unremarkable Neck:  No JVD, no thyromegally Lymphatics:  No adenopathy Back:  No CVA tenderness Lungs:  Clear with no wheezes HEART:  Regular rate rhythm, no murmurs, no rubs, no clicks Abd:  soft, positive bowel sounds, no organomegally, no rebound, no guarding Ext:  2 plus pulses, no edema, no cyanosis, no clubbing Skin:  No rashes no nodules Neuro:  CN II through XII intact, motor grossly intact  EKG - NSR with ventricular pacing  DEVICE  Normal device function.  See PaceArt for details.   Assess/Plan: 1. CHB - he has no conduction today. He is asymptomatic s/p PPM insertion. 2. Weight loss - he is down 40 lbs over the past 2 years. I encouraged him to increase his intake. 3. PPM - his [employer] DDD PM is working normally. We will recheck in several months.  Victor Osborne.

## 2018-12-12 NOTE — Patient Instructions (Signed)
Medication Instructions:  Your physician recommends that you continue on your current medications as directed. Please refer to the Current Medication list given to you today.  Labwork: None ordered.  Testing/Procedures: None ordered.  Follow-Up: Your physician wants you to follow-up in: one year with Dr. Lovena Le.   You will receive a reminder letter in the mail two months in advance. If you don't receive a letter, please call our office to schedule the follow-up appointment.  Remote monitoring is used to monitor your Pacemaker from home. This monitoring reduces the number of office visits required to check your device to one time per year. It allows [place] to keep an eye on the functioning of your device to ensure it is working properly. You are scheduled for a device check from home on 03/12/2018. You may send your transmission at any time that day. If you have a wireless device, the transmission will be sent automatically. After your physician reviews your transmission, you will receive a postcard with your next transmission date.  Any Other Special Instructions Will Be Listed Below (If Applicable).  If you need a refill on your cardiac medications before your next appointment, please call your pharmacy.

## 2018-12-15 LAB — CUP PACEART REMOTE DEVICE CHECK
Battery Remaining Longevity: 78 mo
Battery Remaining Percentage: 84 %
Brady Statistic RA Percent Paced: 90 %
Brady Statistic RV Percent Paced: 51 %
Date Time Interrogation Session: 20201127002100
Implantable Lead Implant Date: 20150603
Implantable Lead Implant Date: 20150603
Implantable Lead Location: 753859
Implantable Lead Location: 753860
Implantable Lead Model: 4136
Implantable Lead Model: 4137
Implantable Lead Serial Number: 29477746
Implantable Lead Serial Number: 29617326
Implantable Pulse Generator Implant Date: 20150603
Lead Channel Impedance Value: 540 Ohm
Lead Channel Impedance Value: 714 Ohm
Lead Channel Pacing Threshold Amplitude: 0.7 V
Lead Channel Pacing Threshold Amplitude: 1.3 V
Lead Channel Pacing Threshold Pulse Width: 0.4 ms
Lead Channel Pacing Threshold Pulse Width: 0.5 ms
Lead Channel Setting Pacing Amplitude: 1.8 V
Lead Channel Setting Pacing Amplitude: 2 V
Lead Channel Setting Pacing Pulse Width: 0.4 ms
Lead Channel Setting Sensing Sensitivity: 2.5 mV
Pulse Gen Serial Number: 389736

## 2018-12-16 ENCOUNTER — Encounter: Payer: Self-pay | Admitting: Internal Medicine

## 2018-12-16 NOTE — Progress Notes (Signed)
Victor Osborne was seen today in follow up for Parkinsons disease.   I have not seen the patient in over 2 years.  They had moved to [place], and then [place] and patient just recently moved back to [place].  Unfortunately, the impetus for the move back was that his wife died in 2018/09/21.  Records available to me have been reviewed.  Was seeing [facility] and states that his carbidopa/levodopa 25/100 was increased from 1 po tid to 2 po tid about 8-9 months ago (8am/2pm/8pm).   Pt denies falls.  Pt denies lightheadedness, near syncope.  No hallucinations.  There has been significant weight loss, noted by his cardiologist as well as his psychiatrist.  Has had weight loss - relates to caregiving for wife and then loss of wife.  States that went down to 168 lb but has gained some weight since that time. On appetite stimulants now.  States that he has a new puppy that was just born august 14 and it keeps him busy and positive (name is Threasa Beards and he shows me a picture)  Current prescribed movement disorder medications: carbidopa/levodopa 25/100, 2 po tid   PREVIOUS MEDICATIONS: Sinemet  ALLERGIES:  No Known Allergies  CURRENT MEDICATIONS:  Outpatient Encounter Medications as of 12/18/2018  Medication Sig  . alclomethasone (ACLOVATE) 0.05 % ointment Apply 1 application topically 2 (two) times daily as needed (for rash).  . carbamazepine (TEGRETOL) 200 MG tablet Take 2 tablets (400 mg total) by mouth 2 (two) times daily.  . carbidopa-levodopa (SINEMET IR) 25-100 MG tablet Take 1 tablet by mouth 3 (three) times daily. (Patient taking differently: Take 2 tablets by mouth 3 (three) times daily. )  . cholecalciferol (VITAMIN D) 1000 UNITS tablet Take 2,000 Units by mouth daily.   Marland Kitchen desoximetasone (TOPICORT) 0.25 % cream Apply 1 application topically 2 (two) times daily.  . Flaxseed, Linseed, (FLAX SEED OIL) 1300 MG CAPS Take 1 capsule by mouth daily.   Marland Kitchen FLUoxetine (PROZAC) 20 MG tablet  Take 1 tablet (20 mg total) by mouth daily.  Marland Kitchen levothyroxine (LEVOTHROID) 125 MCG tablet Take 1 tablet (125 mcg total) by mouth daily before breakfast.  . Multiple Vitamin (MULITIVITAMIN WITH MINERALS) TABS Take 1 tablet by mouth daily.  . Omega-3 Fatty Acids (OMEGA-3 CF PO) Take 1,200 mg by mouth daily.   . pravastatin (PRAVACHOL) 40 MG tablet Take 1 tablet (40 mg total) by mouth daily.  . QUEtiapine (SEROQUEL XR) 50 MG TB24 24 hr tablet Take 1 tablet (50 mg total) by mouth at bedtime.   No facility-administered encounter medications on file as of 12/18/2018.     PAST MEDICAL HISTORY:   Past Medical History:  Diagnosis Date  . Bipolar 1 disorder ([place])   . COPD (chronic obstructive pulmonary disease) ([place])   . Depression   . First degree AV block   . Hypertension    pt denies but was in medical record  . Hypoglycemia, unspecified   . Myalgia and myositis, unspecified   . Obesity   . Pneumonia 1970  . Skin cancer (melanoma) ([place])   . Thyroid disease   . Unspecified hypothyroidism   . UPJ obstruction, congenital     PAST SURGICAL HISTORY:   Past Surgical History:  Procedure Laterality Date  . APPENDECTOMY  1939  . malignant melanoma removed from right forehead  2008  . PERMANENT PACEMAKER INSERTION N/A 06/19/2013   Procedure: PERMANENT PACEMAKER INSERTION;  Surgeon: Evans Lance, MD;  Location: [facility] CATH LAB;  Service: Cardiovascular;  Laterality: N/A;  . right big toe  surgery  2009  . TRANSURETHRAL RESECTION OF PROSTATE  02/04/2011   Procedure: TRANSURETHRAL RESECTION OF THE PROSTATE (TURP);  Surgeon: Dutch Gray, MD;  Location: [facility] ORS;  Service: Urology;  Laterality: N/A;  . URETHRA SURGERY  2010   reconstruction    SOCIAL HISTORY:   Social History   Socioeconomic History  . Marital status: Widowed    Spouse name: Not on file  . Number of children: Not on file  . Years of education: Not on file  . Highest education level: Not on file  Occupational History  .  Occupation: retired  Scientific laboratory technician  . Financial resource strain: Not on file  . Food insecurity    Worry: Not on file    Inability: Not on file  . Transportation needs    Medical: Not on file    Non-medical: Not on file  Tobacco Use  . Smoking status: Former Smoker    Packs/day: 2.00    Years: 40.00    Pack years: 80.00    Types: Cigarettes  . Smokeless tobacco: Never Used  Substance and Sexual Activity  . Alcohol use: No  . Drug use: No  . Sexual activity: Not Currently  Lifestyle  . Physical activity    Days per week: Not on file    Minutes per session: Not on file  . Stress: Not on file  Relationships  . Social Herbalist on phone: Not on file    Gets together: Not on file    Attends religious service: Not on file    Active member of club or organization: Not on file    Attends meetings of clubs or organizations: Not on file    Relationship status: Not on file  . Intimate partner violence    Fear of current or ex partner: Not on file    Emotionally abused: Not on file    Physically abused: Not on file    Forced sexual activity: Not on file  Other Topics Concern  . 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 Mother  Deceased  . Father  Deceased  . MGM  Deceased  . MGF  Deceased  . PGM  Deceased  . PGF  Deceased  . Sister  Alive  . Brother  Alive  . Daughter  Alive  . Daughter  Deceased    ROS:  Review of Systems  Constitutional: Negative.   HENT: Negative.   Eyes: Negative.   Respiratory: Negative.   Cardiovascular: Negative.   Gastrointestinal: Negative.   Musculoskeletal: Negative.   Skin: Negative.   Endo/Heme/Allergies: Negative.     PHYSICAL EXAMINATION:    VITALS:   Vitals:   12/18/18 0803  BP: (!) 156/77   Pulse: 65  Resp: 16  SpO2: 100%  Weight: 176 lb 12.8 oz (80.2 kg)  Height: 6' 4"  (1.93 m)   Wt Readings from Last 3 Encounters:  12/18/18 176 lb 12.8 oz (80.2 kg)  12/12/18 177 lb (80.3 kg)  10/31/17 201 lb 9.6 oz (91.4 kg)     GEN:  The patient appears stated  age and is in NAD.  Pt jokes and laughs HEENT:  Normocephalic, atraumatic.  The mucous membranes are moist. The superficial temporal arteries are without ropiness or tenderness. CV:  RRR Lungs:  CTAB Neck/HEME:  There are no carotid bruits bilaterally.  Neurological examination:  Orientation: The patient is alert and oriented x3. Cranial nerves: There is good facial symmetry with mild facial hypomimia. The speech is fluent and clear.  He is hypophonic and slightly hoarse. Soft palate rises symmetrically and there is no tongue deviation. Hearing is intact to conversational tone. Sensation: Sensation is intact to light touch throughout Motor: Strength is at least antigravity x4.  Movement examination: Tone: There is normal tone in the UE/LE Abnormal movements: none Coordination:  There is no decremation with RAM's, with any form of RAMS, including alternating supination and pronation of the forearm, hand opening and closing, finger taps, heel taps and toe taps. Gait and Station: The patient has mild difficulty arising out of a deep-seated chair without the use of the hands. The patient's stride length is good with decreased arm swing on the L.        Chemistry      Component Value Date/Time   NA 145 10/31/2017 1428   K 3.9 10/31/2017 1428   CL 108 10/31/2017 1428   CO2 28 10/31/2017 1428   BUN 18 10/31/2017 1428   CREATININE 1.00 10/31/2017 1428      Component Value Date/Time   CALCIUM 9.6 10/31/2017 1428   ALKPHOS 101 03/13/2012 1607   AST 25 03/13/2012 1607   ALT 28 03/13/2012 1607   BILITOT 0.2 (L) 03/13/2012 1607     Lab Results  Component Value Date   TSH 3.28 10/31/2017      ASSESSMENT/PLAN:  1.   Parkinson's disease, with abnormal DaTscan in 2017  -continue carbidopa/levodopa 25/100, 2 po tid.  Would recommend changing time of medication to 8am/noon/4pm.  -We discussed that it used to be thought that levodopa would increase risk of melanoma but now it is believed that Parkinsons itself likely increases risk of melanoma. he is to get regular skin checks.  He just saw Dr. Delman Cheadle.  He has a history of melanoma x 2  -met social worker today  2.  Bipolar disorder  -Patient under the care of psychiatry  -Patient on Tegretol 200 mg, 2 tablets twice per day  -Patient on Seroquel XR, 50 mg at bedtime  3.  Weight loss  -related to death of wife.  Gaining weight now.  4.  Follow up is anticipated in the next 4-6 months, sooner should new neurologic issues arise.  Much greater than 50% of this visit was spent in counseling and coordinating care.  Total face to face time:  25 min  Cc:  Patient, No Pcp Per

## 2018-12-17 LAB — CUP PACEART INCLINIC DEVICE CHECK
Brady Statistic RA Percent Paced: 85 %
Brady Statistic RV Percent Paced: 45 %
Date Time Interrogation Session: 20201125000000
Implantable Lead Implant Date: 20150603
Implantable Lead Implant Date: 20150603
Implantable Lead Location: 753859
Implantable Lead Location: 753860
Implantable Lead Model: 4136
Implantable Lead Model: 4137
Implantable Lead Serial Number: 29477746
Implantable Lead Serial Number: 29617326
Implantable Pulse Generator Implant Date: 20150603
Lead Channel Impedance Value: 592 Ohm
Lead Channel Impedance Value: 776 Ohm
Lead Channel Pacing Threshold Amplitude: 0.7 V
Lead Channel Pacing Threshold Amplitude: 1 V
Lead Channel Pacing Threshold Pulse Width: 0.4 ms
Lead Channel Pacing Threshold Pulse Width: 0.5 ms
Lead Channel Sensing Intrinsic Amplitude: 12 mV
Lead Channel Sensing Intrinsic Amplitude: 3.6 mV
Lead Channel Setting Pacing Amplitude: 2 V
Lead Channel Setting Pacing Amplitude: 2 V
Lead Channel Setting Pacing Pulse Width: 0.4 ms
Lead Channel Setting Sensing Sensitivity: 2.5 mV
Pulse Gen Serial Number: 389736

## 2018-12-18 ENCOUNTER — Ambulatory Visit (INDEPENDENT_AMBULATORY_CARE_PROVIDER_SITE_OTHER): Payer: Medicare Other

## 2018-12-18 ENCOUNTER — Ambulatory Visit (INDEPENDENT_AMBULATORY_CARE_PROVIDER_SITE_OTHER): Payer: Medicare Other | Admitting: Internal Medicine

## 2018-12-18 ENCOUNTER — Encounter: Payer: Self-pay | Admitting: Internal Medicine

## 2018-12-18 ENCOUNTER — Ambulatory Visit (INDEPENDENT_AMBULATORY_CARE_PROVIDER_SITE_OTHER): Payer: Medicare Other | Admitting: Neurology

## 2018-12-18 ENCOUNTER — Other Ambulatory Visit: Payer: Self-pay

## 2018-12-18 ENCOUNTER — Encounter: Payer: Self-pay | Admitting: Neurology

## 2018-12-18 VITALS — BP 156/77 | HR 65 | Resp 16 | Ht 76.0 in | Wt 176.8 lb

## 2018-12-18 VITALS — BP 118/68 | HR 72 | Temp 97.2°F | Ht 76.0 in | Wt 175.0 lb

## 2018-12-18 DIAGNOSIS — J449 Chronic obstructive pulmonary disease, unspecified: Secondary | ICD-10-CM

## 2018-12-18 DIAGNOSIS — G4733 Obstructive sleep apnea (adult) (pediatric): Secondary | ICD-10-CM

## 2018-12-18 DIAGNOSIS — G20 Parkinson's disease: Secondary | ICD-10-CM | POA: Diagnosis not present

## 2018-12-18 NOTE — Assessment & Plan Note (Signed)
Insignificant symptoms without needing bronchodilators. Plan- update CXR

## 2018-12-18 NOTE — Patient Instructions (Signed)
Order- DME CPAP- please service machine, note new address, continue auto 5-15, mask of choice, humidifier, supplies, AirView/ card  Order- CXR   Dx COPD  Please call if we can help

## 2018-12-18 NOTE — Progress Notes (Signed)
HPI M former smoker, followed for COPD, rhinitis, OSA, complicated by BiPolar, HBP, hx melanoma, DM, pacemaker, Parkinsons NPSG 02/29/96- AHI 79/ hr, desaturation to 87%, body weight 250 lbs 6 MWT- 99%, 100%, 99%, 510 meters PFT 07/07/2011-mild obstructive airways disease, small airways, insignificant response to bronchodilator. Normal lung volumes, normal diffusion capacity. FEV1/FVC 0.61  --------------------------------------------------------------------------------------------------------    03/24/2016-85 year old male former smoker followed for COPD, rhinitis, OSA, complicated by bipolar, HBP, history melanoma, DM 2, pacemaker, + Parkinson's CPAP 9/[facility] OSA follow up. Wants to see if he can get a new machine. Breathing has been ok since last visit. Having to care for wife who has very severe Parkinson's disease and comes today in a wheelchair.   Says he is breathing well. No routine cough or wheeze. Never uses inhalers CPAP very comfortable used every night and sleeps better with it. Download not available. He asks about replacing old machine.  12/18/2018- 85 year old male former smoker followed for COPD, rhinitis, OSA, complicated by Bipolar, HBP, history melanoma, DM 2, pacemaker, + Parkinson's, Hypothyroid, CPAP auto 5-15/[facility] Had moved away. When wife died this summer he moved back to [place]. Seroquel at night managed by psychiatry. Parkinson's managed by Neurology.  -----f/u OSA  last seen 03/24/2016 Download- no use recorded after Oct 25, AHI 8.9/ hr . He put vinegar into reservoir by mistake and was afraid to use machine after that.  Breathing ok- not using any inhalers and denies significant dyspnea or cough.  UTD flu and pneumonia vax.     ROS-see HPI    + = positive Constitutional:   No-   weight loss, night sweats, fevers, chills, fatigue, lassitude. HEENT:   No-  headaches, difficulty swallowing, tooth/dental problems, sore throat,       No-  sneezing, itching, ear  ache, nasal congestion, post nasal drip,  CV:  No-   chest pain, orthopnea, PND, swelling in lower extremities, anasarca, dizziness, palpitations Resp: +  shortness of breath with exertion or at rest.              No-productive cough,  No non-productive cough,  No- coughing up of blood.              No-   change in color of mucus.  No- wheezing.   Skin: No-   rash or lesions. GI:  No-   heartburn, indigestion, abdominal pain, nausea, vomiting,  GU:  MS:  + joint pain or swelling. Weakness and falls. Neuro-     tremor Psych:  No- change in mood or affect. No depression or anxiety.  No memory loss.  OBJ   exam similar to last visit General- Alert, Oriented, Affect-appropriate, Distress- none acute, looks well Skin- rash-none, lesions- none, excoriation- none Lymphadenopathy- none Head- atraumatic            Eyes- Gross vision intact, PERRLA, conjunctivae clear secretions,             Ears- Hearing, canals-normal            Nose- Clear, no-Septal dev, mucus, polyps, erosion, perforation             Throat- Mallampati II , mucosa clear , drainage- none, tonsils- atrophic Neck- flexible , trachea midline, no stridor , thyroid nl, carotid no bruit Chest - symmetrical excursion , unlabored           Heart/CV-  Pulse is RRR occasional skip , no murmur , no gallop  , no rub, nl s1 s2                           -  JVD- none , edema- none, stasis changes- none, varices- none           Lung- clear, unlabored, wheeze- none, cough- none , dullness-none,  rub- none           Chest wall- + left pacemaker Abd-  Br/ Gen/ Rectal- Not done, not indicated Extrem- cyanosis- none, clubbing, none, atrophy- none, strength- nl, using a cane.  Neuro- +resting tremor of hands

## 2018-12-18 NOTE — Assessment & Plan Note (Signed)
Benefits from CPAP and was doing well. I doubt vinegar in reservoir hurt his machine but we will ask [employer] to service it. Continue CPAP 9

## 2018-12-18 NOTE — Patient Instructions (Addendum)
1.  Take carbidopa/levodopa 25/100, 2 tablets at 8am/noon/4pm  The physicians and staff at [facility] are committed to providing excellent care. You may receive a survey requesting feedback about your experience at our office. We strive to receive "very good" responses to the survey questions. If you feel that your experience would prevent you from giving the office a "very good " response, please contact our office to try to remedy the situation. We may be reached at [phone]. Thank you for taking the time out of your busy day to complete the survey.

## 2018-12-20 ENCOUNTER — Telehealth: Payer: Self-pay | Admitting: Internal Medicine

## 2018-12-20 NOTE — Telephone Encounter (Signed)
  Notes recorded by Deneise Lever, MD on 12/18/2018 at 3:44 PM EST  CXR evidence of COPD, but no acute or active process seen.  LMTCB

## 2018-12-21 NOTE — Telephone Encounter (Signed)
Called and spoke with pt letting him know the results of the cxr. Pt verbalized understanding. Nothing further needed. 

## 2018-12-21 NOTE — Telephone Encounter (Signed)
Patient is returning phone call.  Patient phone number is [phone].

## 2019-01-17 ENCOUNTER — Encounter: Payer: Self-pay | Admitting: Psychiatry

## 2019-01-18 LAB — COMPREHENSIVE METABOLIC PANEL
ALT: 15 IU/L (ref 0–44)
AST: 21 IU/L (ref 0–40)
Albumin/Globulin Ratio: 2.3 — ABNORMAL HIGH (ref 1.2–2.2)
Albumin: 4.3 g/dL (ref 3.6–4.6)
Alkaline Phosphatase: 110 IU/L (ref 39–117)
BUN/Creatinine Ratio: 12 (ref 10–24)
BUN: 15 mg/dL (ref 8–27)
Bilirubin Total: 0.3 mg/dL (ref 0.0–1.2)
CO2: 25 mmol/L (ref 20–29)
Calcium: 9.1 mg/dL (ref 8.6–10.2)
Chloride: 104 mmol/L (ref 96–106)
Creatinine, Ser: 1.24 mg/dL (ref 0.76–1.27)
GFR calc Af Amer: 61 mL/min/{1.73_m2} (ref >59)
GFR calc non Af Amer: 53 mL/min/{1.73_m2} — ABNORMAL LOW (ref >59)
Globulin, Total: 1.9 g/dL (ref 1.5–4.5)
Glucose: 96 mg/dL (ref 65–99)
Potassium: 4.2 mmol/L (ref 3.5–5.2)
Sodium: 143 mmol/L (ref 134–144)
Total Protein: 6.2 g/dL (ref 6.0–8.5)

## 2019-01-18 LAB — CBC WITH DIFFERENTIAL/PLATELET
Basophils Absolute: 0.1 10*3/uL (ref 0.0–0.2)
Basos: 2 %
EOS (ABSOLUTE): 0.3 10*3/uL (ref 0.0–0.4)
Eos: 9 %
Hematocrit: 41.1 % (ref 37.5–51.0)
Hemoglobin: 13.4 g/dL (ref 13.0–17.7)
Immature Grans (Abs): 0 10*3/uL (ref 0.0–0.1)
Immature Granulocytes: 0 %
Lymphocytes Absolute: 0.6 10*3/uL — ABNORMAL LOW (ref 0.7–3.1)
Lymphs: 16 %
MCH: 31.6 pg (ref 26.6–33.0)
MCHC: 32.6 g/dL (ref 31.5–35.7)
MCV: 97 fL (ref 79–97)
Monocytes Absolute: 0.4 10*3/uL (ref 0.1–0.9)
Monocytes: 12 %
Neutrophils Absolute: 2.2 10*3/uL (ref 1.4–7.0)
Neutrophils: 61 %
Platelets: 188 10*3/uL (ref 150–450)
RBC: 4.24 x10E6/uL (ref 4.14–5.80)
RDW: 12.9 % (ref 11.6–15.4)
WBC: 3.6 10*3/uL (ref 3.4–10.8)

## 2019-01-18 LAB — TSH: TSH: 3.92 u[IU]/mL (ref 0.450–4.500)

## 2019-01-18 LAB — HEMOGLOBIN A1C
Est. average glucose Bld gHb Est-mCnc: 100 mg/dL
Hgb A1c MFr Bld: 5.1 % (ref 4.8–5.6)

## 2019-01-18 LAB — CARBAMAZEPINE, FREE AND TOTAL
Carbamazepine, Free: 2.4 ug/mL (ref 0.6–4.2)
Carbamazepine, Total: 8.5 ug/mL (ref 4.0–12.0)

## 2019-01-21 ENCOUNTER — Telehealth: Payer: Self-pay | Admitting: Psychiatry

## 2019-01-21 NOTE — Telephone Encounter (Signed)
Phone report when office opens after 01/16/2019 lab results doing despite reducing Tegretol from 1000 to 800 mg daily, the Tegretol level is therapeutic at 8.5 up from last measurement of 7.4 with free carbamazepine 2.4 all okay.  WBC is 3600 down from 6500 last measurement for his Tegretol labs with hemoglobin A1c normal at 5.1%, CMP normal with AST 21 and ALT 15, and TSH normal at 3.92.  Patient is reassured that by the slight reduction in Tegretol dose, level is therapeutic and he asserts that he is doing well thus far since last appointment.  Seroquel labs are safe as well for continuing Seroquel like Tegretol

## 2019-02-04 ENCOUNTER — Ambulatory Visit (INDEPENDENT_AMBULATORY_CARE_PROVIDER_SITE_OTHER): Payer: Medicare Other | Admitting: Otolaryngology

## 2019-02-12 ENCOUNTER — Other Ambulatory Visit: Payer: Self-pay

## 2019-02-12 ENCOUNTER — Ambulatory Visit (INDEPENDENT_AMBULATORY_CARE_PROVIDER_SITE_OTHER): Payer: Medicare Other | Admitting: Otolaryngology

## 2019-02-12 VITALS — Temp 97.9°F

## 2019-02-12 DIAGNOSIS — H6123 Impacted cerumen, bilateral: Secondary | ICD-10-CM

## 2019-02-12 NOTE — Progress Notes (Signed)
HPI: Victor Osborne is a 86 y.o. male who presents for evaluation of ear wax buildup which is much worse on the right side.  He has chronic eczema also which involves the right ear..  Past Medical History:  Diagnosis Date  . Bipolar 1 disorder ([place])   . COPD (chronic obstructive pulmonary disease) ([place])   . Depression   . First degree AV block   . Hypertension    pt denies but was in medical record  . Hypoglycemia, unspecified   . Myalgia and myositis, unspecified   . Obesity   . Pneumonia 1970  . Skin cancer (melanoma) ([place])   . Thyroid disease   . Unspecified hypothyroidism   . UPJ obstruction, congenital    Past Surgical History:  Procedure Laterality Date  . APPENDECTOMY  1939  . malignant melanoma removed from right forehead  2008  . PERMANENT PACEMAKER INSERTION N/A 06/19/2013   Procedure: PERMANENT PACEMAKER INSERTION;  Surgeon: Evans Lance, MD;  Location: [facility] CATH LAB;  Service: Cardiovascular;  Laterality: N/A;  . right big toe  surgery  2009  . TRANSURETHRAL RESECTION OF PROSTATE  02/04/2011   Procedure: TRANSURETHRAL RESECTION OF THE PROSTATE (TURP);  Surgeon: Dutch Gray, MD;  Location: [facility] ORS;  Service: Urology;  Laterality: N/A;  . URETHRA SURGERY  2010   reconstruction   Social History   Socioeconomic History  . Marital status: Widowed    Spouse name: Not on file  . Number of children: Not on file  . Years of education: Not on file  . Highest education level: Not on file  Occupational History  . Occupation: retired  Tobacco Use  . Smoking status: Former Smoker    Packs/day: 2.00    Years: 40.00    Pack years: 80.00    Types: Cigarettes  . Smokeless tobacco: Never Used  Substance and Sexual Activity  . Alcohol use: No  . Drug use: No  . Sexual activity: Not Currently  Other Topics Concern  . 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 Difficulty of Paying Living Expenses: Not on file  Food Insecurity:   . Worried About Charity fundraiser in the Last Year: Not on file  . Ran Out of Food in the Last Year: Not on file  Transportation Needs:   . Lack of Transportation (Medical): Not on file  . Lack of Transportation (Non-Medical): Not on file  Physical Activity:   . Days of Exercise per Week: Not on file  . Minutes of Exercise per Session: Not on file  Stress:   . Feeling of Stress : Not on file  Social Connections:   . Frequency of Communication with Friends and Family: Not on file  . Frequency of Social Gatherings with Friends and Family: Not on file  . Attends Religious Services: Not on file  . Active Member of Clubs or Organizations: Not on file  . Attends Archivist Meetings: Not on file  . Marital Status: Not on file   Family History  Problem Relation Age of Onset  . Cerebral aneurysm Mother   . Lung cancer Father   . Drug abuse Daughter    No Known  Allergies Prior to Admission medications   Medication Sig Start Date End Date Taking? Authorizing Provider  alclomethasone (ACLOVATE) 0.05 % ointment Apply 1 application topically 2 (two) times daily as needed (for rash).    [provider]  carbamazepine (TEGRETOL) 200 MG tablet Take 2 tablets (400 mg total) by mouth 2 (two) times daily. 12/10/18   Delight Hoh, MD  carbidopa-levodopa (SINEMET IR) 25-100 MG tablet Take 1 tablet by mouth 3 (three) times daily. Patient not taking: Reported on 12/18/2018 10/03/16   Tat, Eustace Quail, DO  cholecalciferol (VITAMIN D) 1000 UNITS tablet Take 2,000 Units by mouth daily.     [provider]  desoximetasone (TOPICORT) 0.25 % cream Apply 1 application topically 2 (two) times daily.    [provider]  Flaxseed, Linseed, (FLAX SEED OIL) 1300 MG CAPS Take 1 capsule by  mouth daily.     [provider]  FLUoxetine (PROZAC) 20 MG tablet Take 1 tablet (20 mg total) by mouth daily. Patient not taking: Reported on 12/18/2018 12/10/18   Delight Hoh, MD  levothyroxine (LEVOTHROID) 125 MCG tablet Take 1 tablet (125 mcg total) by mouth daily before breakfast. 10/01/15   [name], DO  Multiple Vitamin (MULITIVITAMIN WITH MINERALS) TABS Take 1 tablet by mouth daily.    [provider]  Omega-3 Fatty Acids (OMEGA-3 CF PO) Take 1,200 mg by mouth daily.     [provider]  pravastatin (PRAVACHOL) 40 MG tablet Take 1 tablet (40 mg total) by mouth daily. 10/01/15   [name], DO  QUEtiapine (SEROQUEL XR) 50 MG TB24 24 hr tablet Take 1 tablet (50 mg total) by mouth at bedtime. 12/10/18   Delight Hoh, MD     Positive ROS: Otherwise negative  All other systems have been reviewed and were otherwise negative with the exception of those mentioned in the HPI and as above.  Physical Exam: Constitutional: Alert, well-appearing, no acute distress Ears: External ears without lesions or tenderness. Ear canals right ear is completely obstructed with cerumen left ear is partially obstructed with cerumen.  This was cleaned using suction and curettes.  TMs were clear bilaterally.  I also applied some gentian violet to the right external ear canal.. Nasal: External nose without lesions. Clear nasal passages Oral: Oropharynx clear. Neck: No palpable adenopathy or masses Respiratory: Breathing comfortably  Skin: No facial/neck lesions or rash noted.  Cerumen impaction removal  Date/Time: 02/12/2019 1:37 PM Performed by: Rozetta Nunnery, MD Authorized by: Rozetta Nunnery, MD   Consent:    Consent obtained:  Verbal   Consent given by:  Patient   Risks discussed:  Pain and bleeding Procedure details:    Location:  L ear and R ear   Procedure type: curette and suction   Post-procedure details:    Inspection:  TM intact and  canal normal   Hearing quality:  Improved   Patient tolerance of procedure:  Tolerated well, no immediate complications Comments:     TMs clear bilaterally.  Right ear canal was completely occluded with cerumen.  He also has mild eczema of the lateral right ear canal that I applied gentian violet.    Assessment: Bilateral cerumen impactions right worse than left. Right lateral ear canal eczema  Plan: This was cleaned in the office he will follow-up as needed  Radene Journey, MD

## 2019-02-22 ENCOUNTER — Telehealth: Payer: Self-pay

## 2019-02-22 NOTE — Telephone Encounter (Signed)
We reduced 30 mg dose (1.5 tablets of Prozac 20 tab) to the 20 mg last June. I left him 2 messages without answer telling him in last message we would OK the capsule with [employer]. If we have to increase back to 30 mg we can send 3 of 10 mg caps or change back to tablet.

## 2019-02-22 NOTE — Telephone Encounter (Signed)
Fluoxetine 20 mg tablets are non formulary. They cover capsules. Okay to dispense capsules instead

## 2019-02-25 ENCOUNTER — Telehealth: Payer: Self-pay | Admitting: Psychiatry

## 2019-02-25 DIAGNOSIS — F4312 Post-traumatic stress disorder, chronic: Secondary | ICD-10-CM

## 2019-02-25 DIAGNOSIS — F3175 Bipolar disorder, in partial remission, most recent episode depressed: Secondary | ICD-10-CM

## 2019-02-25 MED ORDER — FLUOXETINE HCL 20 MG PO CAPS: 20.0000 mg | ORAL_CAPSULE | Freq: Every day | ORAL | 1 refills | Status: DC

## 2019-02-25 NOTE — Telephone Encounter (Signed)
Left patient another voicemail concerning his medications so we can update [facility]

## 2019-02-25 NOTE — Telephone Encounter (Signed)
[employer] pharmacy left message stating insurance does not cover tabs for Fluoxetine. They do cover capsules. Please advise if order can change to capsules. [phone]

## 2019-02-25 NOTE — Telephone Encounter (Signed)
[employer] continues with office nurse to require extra steps for their change required of Prozac tablet to capsule 20 mg every morning sending #[phone] with 1 refill of the capsule to [facility] as required.  I did speak with the patient about this and he accepted on 02/21/2018.

## 2019-02-26 ENCOUNTER — Telehealth: Payer: Self-pay | Admitting: Psychiatry

## 2019-02-26 NOTE — Telephone Encounter (Signed)
eScription sent to [facility] yesterday for the fluoxetine capsules.

## 2019-02-26 NOTE — Telephone Encounter (Signed)
Noted see previous messages

## 2019-02-26 NOTE — Telephone Encounter (Signed)
Patient called and said that he was returning your call Traci. Please call him back at [phone]

## 2019-03-11 ENCOUNTER — Emergency Department (HOSPITAL_BASED_OUTPATIENT_CLINIC_OR_DEPARTMENT_OTHER): Payer: Medicare Other

## 2019-03-11 ENCOUNTER — Encounter (HOSPITAL_BASED_OUTPATIENT_CLINIC_OR_DEPARTMENT_OTHER): Payer: Self-pay | Admitting: *Deleted

## 2019-03-11 ENCOUNTER — Other Ambulatory Visit: Payer: Self-pay

## 2019-03-11 ENCOUNTER — Ambulatory Visit (INDEPENDENT_AMBULATORY_CARE_PROVIDER_SITE_OTHER): Payer: Medicare Other | Admitting: Otolaryngology

## 2019-03-11 ENCOUNTER — Emergency Department (HOSPITAL_BASED_OUTPATIENT_CLINIC_OR_DEPARTMENT_OTHER)
Admission: EM | Admit: 2019-03-11 | Discharge: 2019-03-11 | Disposition: A | Discharge status: Home / Self Care | Payer: Medicare Other | Attending: Emergency Medicine | Admitting: Emergency Medicine

## 2019-03-11 DIAGNOSIS — Z87891 Personal history of nicotine dependence: Secondary | ICD-10-CM | POA: Insufficient documentation

## 2019-03-11 DIAGNOSIS — E039 Hypothyroidism, unspecified: Secondary | ICD-10-CM | POA: Diagnosis not present

## 2019-03-11 DIAGNOSIS — G20 Parkinson's disease: Secondary | ICD-10-CM | POA: Insufficient documentation

## 2019-03-11 DIAGNOSIS — Y999 Unspecified external cause status: Secondary | ICD-10-CM | POA: Diagnosis not present

## 2019-03-11 DIAGNOSIS — I10 Essential (primary) hypertension: Secondary | ICD-10-CM | POA: Insufficient documentation

## 2019-03-11 DIAGNOSIS — W1839XA Other fall on same level, initial encounter: Secondary | ICD-10-CM | POA: Diagnosis not present

## 2019-03-11 DIAGNOSIS — Z79899 Other long term (current) drug therapy: Secondary | ICD-10-CM | POA: Insufficient documentation

## 2019-03-11 DIAGNOSIS — J449 Chronic obstructive pulmonary disease, unspecified: Secondary | ICD-10-CM | POA: Insufficient documentation

## 2019-03-11 DIAGNOSIS — Y929 Unspecified place or not applicable: Secondary | ICD-10-CM | POA: Diagnosis not present

## 2019-03-11 DIAGNOSIS — S62111A Displaced fracture of triquetrum [cuneiform] bone, right wrist, initial encounter for closed fracture: Secondary | ICD-10-CM | POA: Insufficient documentation

## 2019-03-11 DIAGNOSIS — S52571A Other intraarticular fracture of lower end of right radius, initial encounter for closed fracture: Secondary | ICD-10-CM | POA: Diagnosis not present

## 2019-03-11 DIAGNOSIS — Z950 Presence of cardiac pacemaker: Secondary | ICD-10-CM | POA: Insufficient documentation

## 2019-03-11 DIAGNOSIS — S6991XA Unspecified injury of right wrist, hand and finger(s), initial encounter: Secondary | ICD-10-CM | POA: Diagnosis present

## 2019-03-11 DIAGNOSIS — Y93K1 Activity, walking an animal: Secondary | ICD-10-CM | POA: Insufficient documentation

## 2019-03-11 NOTE — ED Provider Notes (Signed)
[facility] EMERGENCY DEPARTMENT Provider Note   CSN: [id number] Arrival date & time: 03/11/19  1336     History Chief Complaint  Patient presents with  . Wrist Injury    Victor Osborne is a 86 y.o. male.  86 year old male with past medical history below who presents with right wrist pain after a fall.  Just prior to arrival, the patient was walking his dog and the dog pulled him forward, causing him to fall on his outstretched right hand as well as his right knee.  He did not hit his head or lose consciousness.  He has been ambulatory since the event.  He has had pain and swelling in his right wrist.  No numbness or tingling.  No other joint swelling or pain.  He reports he is up-to-date on tetanus.  The history is provided by the patient.  Wrist Injury      Past Medical History:  Diagnosis Date  . Bipolar 1 disorder ([place])   . COPD (chronic obstructive pulmonary disease) ([place])   . Depression   . First degree AV block   . Hypertension    pt denies but was in medical record  . Hypoglycemia, unspecified   . Myalgia and myositis, unspecified   . Obesity   . Pneumonia 1970  . Skin cancer (melanoma) ([place])   . Thyroid disease   . Unspecified hypothyroidism   . UPJ obstruction, congenital     Patient Active Problem List   Diagnosis Date Noted  . Chronic post-traumatic stress disorder 12/05/2017  . Bipolar I disorder, current or most recent episode depressed, in partial remission ([place]) 12/05/2017  . Hypothyroidism 10/11/2016  . Parkinson disease ([place]) 09/08/2016  . Pacemaker 09/24/2013  . Sinoatrial node dysfunction ([place]) 06/19/2013  . First degree AV block 04/09/2013  . Allergic rhinitis 03/17/2013  . Obstructive sleep apnea 12/18/2006  . COPD mixed type ([place]) 12/18/2006  . MELANOMA, HX OF 12/18/2006    Past Surgical History:  Procedure Laterality Date  . APPENDECTOMY  1939  . malignant melanoma removed from right forehead  2008  . PERMANENT  PACEMAKER INSERTION N/A 06/19/2013   Procedure: PERMANENT PACEMAKER INSERTION;  Surgeon: Evans Lance, MD;  Location[facility] CATH LAB;  Service: Cardiovascular;  Laterality: N/A;  . right big toe  surgery  2009  . TRANSURETHRAL RESECTION OF PROSTATE  02/04/2011   Procedure: TRANSURETHRAL RESECTION OF THE PROSTATE (TURP);  Surgeon: Dutch Gray, MD;  Location: [facility] ORS;  Service: Urology;  Laterality: N/A;  . URETHRA SURGERY  2010   reconstruction       Family History  Problem Relation Age of Onset  . Cerebral aneurysm Mother   . Lung cancer Father   . Drug abuse Daughter     Social History   Tobacco Use  . Smoking status: Former Smoker    Packs/day: 2.00    Years: 40.00    Pack years: 80.00    Types: Cigarettes  . Smokeless tobacco: Never Used  Substance Use Topics  . Alcohol use: No  . Drug use: No    Home Medications Prior to Admission medications   Medication Sig Start Date End Date Taking? Authorizing Provider  alclomethasone (ACLOVATE) 0.05 % ointment Apply 1 application topically 2 (two) times daily as needed (for rash).    [provider]  carbamazepine (TEGRETOL) 200 MG tablet Take 2 tablets (400 mg total) by mouth 2 (two) times daily. 12/10/18   Delight Hoh, MD  carbidopa-levodopa Donnel Saxon  IR) 25-100 MG tablet Take 1 tablet by mouth 3 (three) times daily. Patient not taking: Reported on 12/18/2018 10/03/16   Tat, Eustace Quail, DO  cholecalciferol (VITAMIN D) 1000 UNITS tablet Take 2,000 Units by mouth daily.     [provider]  desoximetasone (TOPICORT) 0.25 % cream Apply 1 application topically 2 (two) times daily.    [provider]  Flaxseed, Linseed, (FLAX SEED OIL) 1300 MG CAPS Take 1 capsule by mouth daily.     [provider]  FLUoxetine (PROZAC) 20 MG capsule Take 1 capsule (20 mg total) by mouth daily. 02/25/19   Delight Hoh, MD  levothyroxine (LEVOTHROID) 125 MCG tablet Take 1 tablet (125 mcg total) by mouth daily before  breakfast. 10/01/15   [name], DO  Multiple Vitamin (MULITIVITAMIN WITH MINERALS) TABS Take 1 tablet by mouth daily.    [provider]  Omega-3 Fatty Acids (OMEGA-3 CF PO) Take 1,200 mg by mouth daily.     [provider]  pravastatin (PRAVACHOL) 40 MG tablet Take 1 tablet (40 mg total) by mouth daily. 10/01/15   [name], DO  QUEtiapine (SEROQUEL XR) 50 MG TB24 24 hr tablet Take 1 tablet (50 mg total) by mouth at bedtime. 12/10/18   Delight Hoh, MD    Allergies    Patient has no known allergies.  Review of Systems   Review of Systems  Musculoskeletal: Positive for joint swelling.  Skin: Positive for wound.  Neurological: Negative for numbness.    Physical Exam Updated Vital Signs BP (!) 151/87   Pulse 63   Temp 98.5 F (36.9 C) (Oral)   Resp 18   Ht 6\' 4"  (1.93 m)   Wt 74.4 kg   SpO2 100%   BMI 19.96 kg/m   Physical Exam Vitals and nursing note reviewed.  Constitutional:      General: He is not in acute distress.    Appearance: He is well-developed.  HENT:     Head: Normocephalic and atraumatic.  Eyes:     Conjunctiva/sclera: Conjunctivae normal.  Musculoskeletal:        General: Swelling and signs of injury present.     Cervical back: Neck supple.     Comments: Full ROM R knee with no tenderness or joint effusion; edema of radial side R wrist w/ tenderness, 2+ radial pulse, normal ROM at elbow and shoulder, normal ROM fingers  Skin:    General: Skin is warm and dry.     Capillary Refill: Capillary refill takes less than 2 seconds.     Comments: Abrasion R knee  Neurological:     Mental Status: He is alert and oriented to person, place, and time.  Psychiatric:     Comments: Pleasant, talkative     ED Results / Procedures / Treatments   Labs (all labs ordered are listed, but only abnormal results are displayed) Labs Reviewed - No data to display  EKG None  Radiology DG Wrist Complete Right  Result Date:  03/11/2019 CLINICAL DATA:  RIGHT wrist injury, fell this morning, pain and swelling at radial aspect of wrist EXAM: RIGHT WRIST - COMPLETE 3+ VIEW COMPARISON:  None FINDINGS: Low normal osseous mineralization. Narrowing of midcarpal joint with minimal sclerosis. Additional narrowing of the first [facility] joint with spur formation and of the STT joint. Avulsion fracture identified from the dorsal margin of the triquetrum. Additionally, nondisplaced intra-articular radial styloid fracture identified. Subchondral cyst formation seen within scaphoid adjacent to the  scapholunate joint. Soft tissue swelling at radial aspect of distal forearm/wrist and at the dorsum of the carpus. IMPRESSION: Nondisplaced intra-articular RIGHT radial styloid fracture. Displaced avulsion fracture from dorsal margin of RIGHT triquetrum. Osseous demineralization with scattered degenerative changes. Electronically Signed   By: Lavonia Dana M.D.   On: 03/11/2019 14:36    Procedures Procedures (including critical care time)  Medications Ordered in ED Medications - No data to display  ED Course  I have reviewed the triage vital signs and the nursing notes.  Pertinent  imaging results that were available during my care of the patient were reviewed by me and considered in my medical decision making (see chart for details).    MDM Rules/Calculators/A&P                      Neurovascularly intact distally.  Plain films show nondisplaced intra-articular fracture of right radial styloid as well as avulsion fx triquetrum.  Discussed with hand surgeon on-call, Dr. Grandville Silos, appreciate assistance.  He will see the patient in follow-up in approximately 1 week in his clinic will contact the patient for appointment.  He has agreed with plan for sugar tong splint.  I have discussed supportive measures.  Patient declined pain medications, prefers to take tylenol.  Return precautions reviewed. Final Clinical Impression(s) / ED Diagnoses Final  diagnoses:  Other closed intra-articular fracture of distal end of right radius, initial encounter  Closed chip fracture of triquetrum of right wrist, initial encounter    Rx / DC Orders ED Discharge Orders    None       Jiselle Sheu, Wenda Overland, MD 03/11/19 1524

## 2019-03-11 NOTE — ED Notes (Signed)
ED Provider at bedside. 

## 2019-03-11 NOTE — ED Triage Notes (Signed)
Pt c/o fall with right wrist injury x 1 hr ago

## 2019-03-13 ENCOUNTER — Ambulatory Visit (INDEPENDENT_AMBULATORY_CARE_PROVIDER_SITE_OTHER): Payer: Medicare Other | Admitting: *Deleted

## 2019-03-13 DIAGNOSIS — I495 Sick sinus syndrome: Secondary | ICD-10-CM

## 2019-03-13 LAB — CUP PACEART REMOTE DEVICE CHECK
Battery Remaining Longevity: 84 mo
Battery Remaining Percentage: 87 %
Brady Statistic RA Percent Paced: 77 %
Brady Statistic RV Percent Paced: 44 %
Date Time Interrogation Session: 20210224002100
Implantable Lead Implant Date: 20150603
Implantable Lead Implant Date: 20150603
Implantable Lead Location: 753859
Implantable Lead Location: 753860
Implantable Lead Model: 4136
Implantable Lead Model: 4137
Implantable Lead Serial Number: 29477746
Implantable Lead Serial Number: 29617326
Implantable Pulse Generator Implant Date: 20150603
Lead Channel Impedance Value: 588 Ohm
Lead Channel Impedance Value: 764 Ohm
Lead Channel Pacing Threshold Amplitude: 0.7 V
Lead Channel Pacing Threshold Amplitude: 1.5 V
Lead Channel Pacing Threshold Pulse Width: 0.4 ms
Lead Channel Pacing Threshold Pulse Width: 0.5 ms
Lead Channel Setting Pacing Amplitude: 2 V
Lead Channel Setting Pacing Amplitude: 2 V
Lead Channel Setting Pacing Pulse Width: 0.4 ms
Lead Channel Setting Sensing Sensitivity: 2.5 mV
Pulse Gen Serial Number: 389736

## 2019-03-14 NOTE — Progress Notes (Signed)
PPM Remote  

## 2019-04-04 ENCOUNTER — Ambulatory Visit (INDEPENDENT_AMBULATORY_CARE_PROVIDER_SITE_OTHER): Payer: Medicare Other | Admitting: Otolaryngology

## 2019-04-04 ENCOUNTER — Other Ambulatory Visit: Payer: Self-pay

## 2019-04-04 VITALS — Temp 98.1°F

## 2019-04-04 DIAGNOSIS — H6123 Impacted cerumen, bilateral: Secondary | ICD-10-CM | POA: Diagnosis not present

## 2019-04-04 NOTE — Progress Notes (Signed)
HPI: Victor Osborne is a 86 y.o. male who presents for evaluation of ear blockage bilaterally right side worse than left.  It comes and goes but is worse on the right side..  Past Medical History:  Diagnosis Date  . Bipolar 1 disorder ([place])   . COPD (chronic obstructive pulmonary disease) ([place])   . Depression   . First degree AV block   . Hypertension    pt denies but was in medical record  . Hypoglycemia, unspecified   . Myalgia and myositis, unspecified   . Obesity   . Pneumonia 1970  . Skin cancer (melanoma) ([place])   . Thyroid disease   . Unspecified hypothyroidism   . UPJ obstruction, congenital    Past Surgical History:  Procedure Laterality Date  . APPENDECTOMY  1939  . malignant melanoma removed from right forehead  2008  . PERMANENT PACEMAKER INSERTION N/A 06/19/2013   Procedure: PERMANENT PACEMAKER INSERTION;  Surgeon: Evans Lance, MD;  Location: [facility] CATH LAB;  Service: Cardiovascular;  Laterality: N/A;  . right big toe  surgery  2009  . TRANSURETHRAL RESECTION OF PROSTATE  02/04/2011   Procedure: TRANSURETHRAL RESECTION OF THE PROSTATE (TURP);  Surgeon: Dutch Gray, MD;  Location: [facility] ORS;  Service: Urology;  Laterality: N/A;  . URETHRA SURGERY  2010   reconstruction   Social History   Socioeconomic History  . Marital status: Widowed    Spouse name: Not on file  . Number of children: Not on file  . Years of education: Not on file  . Highest education level: Not on file  Occupational History  . Occupation: retired  Tobacco Use  . Smoking status: Former Smoker    Packs/day: 2.00    Years: 40.00    Pack years: 80.00    Types: Cigarettes  . Smokeless tobacco: Never Used  Substance and Sexual Activity  . Alcohol use: No  . Drug use: No  . Sexual activity: Not Currently  Other Topics Concern  . 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 Difficulty of Paying Living Expenses:   Food Insecurity:   . Worried About Charity fundraiser in the Last Year:   . Arboriculturist in the Last Year:   Transportation Needs:   . Film/video editor (Medical):   Marland Kitchen Lack of Transportation (Non-Medical):   Physical Activity:   . Days of Exercise per Week:   . Minutes of Exercise per Session:   Stress:   . Feeling of Stress :   Social Connections:   . Frequency of Communication with Friends and Family:   . Frequency of Social Gatherings with Friends and Family:   . Attends Religious Services:   . Active Member of Clubs or Organizations:   . Attends Archivist Meetings:   Marland Kitchen Marital Status:    Family History  Problem Relation Age of Onset  . Cerebral aneurysm Mother   . Lung cancer Father   . Drug abuse Daughter    No Known Allergies Prior to Admission medications   Medication Sig Start Date End Date Taking? Authorizing Provider  alclomethasone (ACLOVATE) 0.05 % ointment Apply 1 application topically 2 (two) times daily as  needed (for rash).   Yes [provider]  carbamazepine (TEGRETOL) 200 MG tablet Take 2 tablets (400 mg total) by mouth 2 (two) times daily. 12/10/18  Yes Delight Hoh, MD  carbidopa-levodopa (SINEMET IR) 25-100 MG tablet Take 1 tablet by mouth 3 (three) times daily. 10/03/16  Yes Tat, Eustace Quail, DO  cholecalciferol (VITAMIN D) 1000 UNITS tablet Take 2,000 Units by mouth daily.    Yes [provider]  desoximetasone (TOPICORT) 0.25 % cream Apply 1 application topically 2 (two) times daily.   Yes [provider]  Flaxseed, Linseed, (FLAX SEED OIL) 1300 MG CAPS Take 1 capsule by mouth daily.    Yes [provider]  FLUoxetine (PROZAC) 20 MG capsule Take 1 capsule (20 mg total) by mouth daily. 02/25/19  Yes Delight Hoh, MD  levothyroxine (LEVOTHROID) 125  MCG tablet Take 1 tablet (125 mcg total) by mouth daily before breakfast. 10/01/15  Yes [name], DO  Multiple Vitamin (MULITIVITAMIN WITH MINERALS) TABS Take 1 tablet by mouth daily.   Yes [provider]  Omega-3 Fatty Acids (OMEGA-3 CF PO) Take 1,200 mg by mouth daily.    Yes [provider]  pravastatin (PRAVACHOL) 40 MG tablet Take 1 tablet (40 mg total) by mouth daily. 10/01/15  Yes [name], DO  QUEtiapine (SEROQUEL XR) 50 MG TB24 24 hr tablet Take 1 tablet (50 mg total) by mouth at bedtime. 12/10/18  Yes Delight Hoh, MD     Positive ROS: Otherwise negative  All other systems have been reviewed and were otherwise negative with the exception of those mentioned in the HPI and as above.  Physical Exam: Constitutional: Alert, well-appearing, no acute distress Ears: External ears without lesions or tenderness. Ear canals have large amount of wax completely occluding the right ear canal.  This was cleaned with suction and forceps.  Left ear canal had minimal wax buildup that was cleaned with curettes.  TMs were clear bilaterally.. Nasal: External nose without lesions. Clear nasal passages Oral: Oropharynx clear. Neck: No palpable adenopathy or masses Respiratory: Breathing comfortably  Skin: No facial/neck lesions or rash noted.  Procedures  Assessment: Bilateral cerumen impactions right side worse than left  Plan: This was cleaned in the office he will follow-up as needed  Radene Journey, MD

## 2019-04-05 ENCOUNTER — Ambulatory Visit: Payer: Medicare Other | Admitting: Neurology

## 2019-05-20 NOTE — Progress Notes (Signed)
Virtual Visit Via Video   The purpose of this virtual visit is to provide medical care while limiting exposure to the novel coronavirus.    Consent was obtained for video visit:  Yes.   Answered questions that patient had about telehealth interaction:  Yes.   I discussed the limitations, risks, security and privacy concerns of performing an evaluation and management service by telemedicine. I also discussed with the patient that there may be a patient responsible charge related to this service. The patient expressed understanding and agreed to proceed.  Pt location: Home Physician Location: home Name of referring provider:  No ref. provider found I connected with Victor Osborne at patients initiation/request on 05/22/2019 at  9:45 AM EDT by video enabled telemedicine application and verified that I am speaking with the correct person using two identifiers. Pt MRN:  [id number] Pt DOB:  07/07/1932 Video Participants:  Victor Osborne;    Assessment/Plan:   1.  Parkinsons Disease  -Continue carbidopa/levodopa 25/100, 2 tablets 3 times per day  2.  History of melanoma  -Patient follows with Dr. Delman Cheadle.  Besides for having a history of melanoma, Parkinson's alone increases risk for melanoma.  He has an appointment soon.  3.  History of bipolar disorder  -Under care of psychiatry.  Patient on Tegretol and Seroquel XR, 50 mg at bedtime   Subjective:   Victor Osborne was seen today in follow up for Parkinsons disease.  My previous records were reviewed prior to todays visit as well as outside records available to me.  Last visit, I talked to the patient about continuing his levodopa, but moving the timing of the medication to 8 AM/noon/4 PM. pt was in the emergency room on February 22 after a fall.  He was out walking the dog and he realized he lost the leash and he went to pick it up and he tripped over his foot and fell.   He fractured his distal radius.  Pt denies  lightheadedness, near syncope.  No hallucinations.  Mood has been good and "little depressed."  Walking his dog for exercise.  "I still pick up women in the park!"  Current prescribed movement disorder medications: Carbidopa/levodopa 25/100, 2 tablets 3 times per day   ALLERGIES:  No Known Allergies  CURRENT MEDICATIONS:  Outpatient Encounter Medications as of 05/22/2019  Medication Sig  . alclomethasone (ACLOVATE) 0.05 % ointment Apply 1 application topically 2 (two) times daily as needed (for rash).  . carbamazepine (TEGRETOL) 200 MG tablet Take 2 tablets (400 mg total) by mouth 2 (two) times daily.  . carbidopa-levodopa (SINEMET IR) 25-100 MG tablet Take 1 tablet by mouth 3 (three) times daily. (Patient taking differently: Take 2 tablets by mouth 3 (three) times daily. )  . cholecalciferol (VITAMIN D) 1000 UNITS tablet Take 2,000 Units by mouth daily.   Marland Kitchen desoximetasone (TOPICORT) 0.25 % cream Apply 1 application topically 2 (two) times daily.  . Flaxseed, Linseed, (FLAX SEED OIL) 1300 MG CAPS Take 1 capsule by mouth daily.   Marland Kitchen FLUoxetine (PROZAC) 20 MG capsule Take 1 capsule (20 mg total) by mouth daily.  Marland Kitchen levothyroxine (LEVOTHROID) 125 MCG tablet Take 1 tablet (125 mcg total) by mouth daily before breakfast.  . Multiple Vitamin (MULITIVITAMIN WITH MINERALS) TABS Take 1 tablet by mouth daily.  . Omega-3 Fatty Acids (OMEGA-3 CF PO) Take 1,200 mg by mouth daily.   . pravastatin (PRAVACHOL) 40 MG tablet Take 1 tablet (40 mg total) by mouth daily.  Marland Kitchen  QUEtiapine (SEROQUEL XR) 50 MG TB24 24 hr tablet Take 1 tablet (50 mg total) by mouth at bedtime.   No facility-administered encounter medications on file as of 05/22/2019.    Objective:   PHYSICAL EXAMINATION:    VITALS:   Vitals:   05/21/19 1410  Weight: 168 lb (76.2 kg)  Height: 6\' 4"  (1.93 m)    GEN:  The patient appears stated age and is in NAD.  Pt laughing/making jokes HEENT:  Normocephalic, atraumatic.    Neurological  examination:  Orientation: The patient is alert and oriented x3.   Cranial nerves: There is good facial symmetry with minimal facial hypomimia. The speech is fluent and clear, slightly hoarse.  Hearing is intact to conversational tone. Sensation: Sensation is intact to light touch throughout Motor: Strength is at least antigravity x4.  Movement examination: Abnormal movements: none seen but didn't see hands at rest Gait and Station: The patient has no difficulty arising out of a deep-seated chair without the use of the hands. The patient's stride length is good.    I have reviewed and interpreted the following labs independently    Chemistry      Component Value Date/Time   NA 143 01/16/2019 0838   K 4.2 01/16/2019 0838   CL 104 01/16/2019 0838   CO2 25 01/16/2019 0838   BUN 15 01/16/2019 0838   CREATININE 1.24 01/16/2019 0838      Component Value Date/Time   CALCIUM 9.1 01/16/2019 0838   ALKPHOS 110 01/16/2019 0838   AST 21 01/16/2019 0838   ALT 15 01/16/2019 0838   BILITOT 0.3 01/16/2019 0838       Lab Results  Component Value Date   WBC 3.6 01/16/2019   HGB 13.4 01/16/2019   HCT 41.1 01/16/2019   MCV 97 01/16/2019   PLT 188 01/16/2019    Lab Results  Component Value Date   TSH 3.920 01/16/2019     Total time spent on today's visit was 20 minutes, including both face-to-face time and nonface-to-face time.  Time included that spent on review of records (prior notes available to me/labs/imaging if pertinent), discussing treatment and goals, answering patient's questions and coordinating care.  Cc:  Patient, No Pcp Per

## 2019-05-21 ENCOUNTER — Encounter: Payer: Self-pay | Admitting: Neurology

## 2019-05-22 ENCOUNTER — Telehealth (INDEPENDENT_AMBULATORY_CARE_PROVIDER_SITE_OTHER): Payer: Medicare Other | Admitting: Neurology

## 2019-05-22 ENCOUNTER — Other Ambulatory Visit: Payer: Self-pay

## 2019-05-22 VITALS — Ht 76.0 in | Wt 168.0 lb

## 2019-05-22 DIAGNOSIS — G20 Parkinson's disease: Secondary | ICD-10-CM | POA: Diagnosis not present

## 2019-05-27 ENCOUNTER — Other Ambulatory Visit: Payer: Self-pay | Admitting: Psychiatry

## 2019-05-27 DIAGNOSIS — F3175 Bipolar disorder, in partial remission, most recent episode depressed: Secondary | ICD-10-CM

## 2019-06-10 ENCOUNTER — Ambulatory Visit (INDEPENDENT_AMBULATORY_CARE_PROVIDER_SITE_OTHER): Payer: Medicare Other | Admitting: Psychiatry

## 2019-06-10 ENCOUNTER — Other Ambulatory Visit: Payer: Self-pay

## 2019-06-10 ENCOUNTER — Encounter: Payer: Self-pay | Admitting: Psychiatry

## 2019-06-10 VITALS — Ht 76.0 in | Wt 186.0 lb

## 2019-06-10 DIAGNOSIS — F4312 Post-traumatic stress disorder, chronic: Secondary | ICD-10-CM

## 2019-06-10 DIAGNOSIS — F3175 Bipolar disorder, in partial remission, most recent episode depressed: Secondary | ICD-10-CM

## 2019-06-10 NOTE — Progress Notes (Signed)
[facility]  Patient ID: Victor Osborne,  MRN: [id number]  PCP: Patient, No Pcp Per  Date of Evaluation: 06/10/2019 Time spent:20 minutes from 0905 to [place]  Chief Complaint:  Chief Complaint    Manic Behavior; Depression; Anxiety; Trauma      HISTORY/CURRENT STATUS: Victor Osborne is seen onsite in office 20 minutes face-to-face individually with consent with epic collateral for psychiatric interview and exam in 36-month evaluation and management of Bipolar 1 and PTSD.  Patient is playful discussing his adaptation to living his life without mentioning wife's name or her death.  Rather he discusses his gradual object relations adaptation to living in [place] at his current residence stating he had to spend time with acquaintances in [place] in order to establish his own adjustment to living in [place] but has been successful.  In fact now has a girlfriend his age who is a widow and may stay over.  He discusses playfully his sex life such as when medical assignments are given to him on a routine or schedule such as his Parkinson's and weight.  His neurologist wants him to maintain weight and he has gained 18 pounds since last visit 6 months ago.  He also purchased in [place] a dog at $2800 which shocked him but there was also $600 of vet and other expenses though he states he fell in love with the dog away.  He therefore has a current interesting comfortable life.  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 He does complain that his last [employer] refill for carbamazepine 200 mg IR was at a cost of $150 to him and the quetiapine XR was cost of $24 but apparently no cost through the fluoxetine, in case those can be changed to avoid cost but then he states that is nothing as he eats out every meal at a cost of more than that.Marland Kitchen  He is driving safely and is secure in  other ways.  He has no mania, suicidality, psychosis or delirium today.  Depression             The patient presents withdepressionasa recurrentproblem with most recent episode starting more than 65monthago. The onset quality is sudden occuringintermittently.The most recent episode lastedmonths. The problem has been waxing and waningsince onset now improved.Associated symptoms include decreased concentration, insomnia,appetite changeand social satire compensating for reactive sadness. Associated symptoms include no restlessness,no morbidguilt, noirritability,no decreased interest, no body aches,no headachesand no suicidal ideas.The symptoms are aggravated by family issues and social issues.Past treatments include SSRIs - Selective serotonin reuptake inhibitors and other medications.Compliance with treatment is good.Past compliance problems include difficulty with treatment plan and medication issues.Previous treatment provided moderaterelief.Risk factors include a change in medication usage/dosage, prior traumatic experience, stress, family history of mental illness, family history, major life event and prior psychiatric admission. Past medical history includes chronic pain,chronic illness,physical disability,anxiety,bipolar disorder,depression,mental health disorderand post-traumatic stress disorder. Pertinent negatives includeno recent psychiatric admission,no Alzheimer's disease,no brain trauma,no dementia,no eating disorder,no obsessive-compulsive disorder,no schizophrenia,no suicide attemptsand no head trauma.  Individual Medical History/ Review of Systems: Changes? :Yes He is monitored for COPD, cardiac pacemaker, vascular disease, Parkinson's, and thyroid.  He is astute for labs for Tegretol and Seroquel 01/16/2019 being normal including TSH 3.92 and total carbamazepine 8.5 mcg/mL with free 2.4.  Allergies: Patient has no known  allergies.  Current Medications:  Current Outpatient Medications:  .  alclomethasone (ACLOVATE) 0.05 % ointment, Apply 1 application topically 2 (two) times daily as needed (for rash)., Disp: , Rfl:  .  carbamazepine (TEGRETOL) 200 MG tablet, TAKE 2 TABLETS TWICE DAILY, Disp: 360 tablet, Rfl: 0 .  carbidopa-levodopa (SINEMET IR) 25-100 MG tablet, Take 1 tablet by mouth 3 (three) times daily. (Patient taking differently: Take 2 tablets by mouth 3 (three) times daily. ), Disp: 270 tablet, Rfl: 1 .  cholecalciferol (VITAMIN D) 1000 UNITS tablet, Take 2,000 Units by mouth daily. , Disp: , Rfl:  .  desoximetasone (TOPICORT) 0.25 % cream, Apply 1 application topically 2 (two) times daily., Disp: , Rfl:  .  Flaxseed, Linseed, (FLAX SEED OIL) 1300 MG CAPS, Take 1 capsule by mouth daily. , Disp: , Rfl:  .  FLUoxetine (PROZAC) 20 MG capsule, Take 1 capsule (20 mg total) by mouth daily., Disp: 90 capsule, Rfl: 1 .  levothyroxine (LEVOTHROID) 125 MCG tablet, Take 1 tablet (125 mcg total) by mouth daily before breakfast., Disp: 90 tablet, Rfl: 3 .  Multiple Vitamin (MULITIVITAMIN WITH MINERALS) TABS, Take 1 tablet by mouth daily., Disp: , Rfl:  .  Omega-3 Fatty Acids (OMEGA-3 CF PO), Take 1,200 mg by mouth daily. , Disp: , Rfl:  .  pravastatin (PRAVACHOL) 40 MG tablet, Take 1 tablet (40 mg total) by mouth daily., Disp: 90 tablet, Rfl: 3 .  QUEtiapine (SEROQUEL XR) 50 MG TB24 24 hr tablet, TAKE 1 TABLET (50 MG TOTAL) BY MOUTH AT BEDTIME., Disp: 90 tablet, Rfl: 0   Medication Side Effects: none  Family Medical/ Social History: Changes? No wishing to discuss the loss of his wife today  MENTAL HEALTH EXAM:  Height 6\' 4"  (1.93 m), weight 186 lb (84.4 kg).Body mass index is 22.64 kg/m. Muscle strengths and tone 5/5, postural reflexes and gait 0/0, and AIMS = 0 with no significant tremor observed today.  General Appearance: Casual, Fairly Groomed and Guarded  Eye Contact:  Good  Speech:  Clear and Coherent,  Garbled and Talkative  Volume:  Normal  Mood:  Anxious, Dysphoric, Euphoric and Euthymic  Affect:  Congruent, Inappropriate, Labile and Full Range  Thought Process:  Coherent, Goal Directed, Irrelevant and Descriptions of Associations: Tangential  Orientation:  Full (Time, Place, and Person)  Thought Content: Rumination and Tangential   Suicidal Thoughts:  No  Homicidal Thoughts:  No  Memory:  Immediate;   Good Remote;   Good  Judgement:  Fair  Insight:  Good  Psychomotor Activity:  Normal, Increased and Mannerisms  Concentration:  Concentration: Good and Attention Span: Good  Recall:  Good  Fund of Knowledge: Good  Language: Good  Assets:  Desire for Improvement Leisure Time Resilience Social Support  ADL's:  Intact  Cognition: WNL  Prognosis:  Fair    DIAGNOSES:    ICD-10-CM   1. Bipolar I disorder, current or most recent episode depressed, in partial remission ([place])  F31.75   2. Chronic post-traumatic stress disorder  F43.12     Receiving Psychotherapy: No    RECOMMENDATIONS: He was very apable requesting and reviewing laboratory function at start of this year.  He reviews economics particularly for medication refusing to change Seroquel to the IR formulation wishes to continue carbamazepine IR.  Has current supply from [facility] for Tegretol 200 mg IR aching 2 tablets twice daily dosing at the time of last laboratory check bipolar disorder.  He continues Seroquel XR 50 mg every bedtime renewed 2 weeks ago for bipolar and PTSD.  He  continues Prozac 20 mg every morning having a 72-month supply from February 8 for PTSD and bipolar  Depression.  Psycho supportive psychoeducation updates prevention and monitoring for safety hygiene integrating with cognitive behavioral trauma focused object loss sleep hygiene and frustration management.  He returns for follow-up in 6 months or sooner if needed.   Delight Hoh, MD

## 2019-06-12 ENCOUNTER — Ambulatory Visit (INDEPENDENT_AMBULATORY_CARE_PROVIDER_SITE_OTHER): Payer: Medicare Other | Admitting: *Deleted

## 2019-06-12 DIAGNOSIS — I495 Sick sinus syndrome: Secondary | ICD-10-CM | POA: Diagnosis not present

## 2019-06-12 LAB — CUP PACEART REMOTE DEVICE CHECK
Battery Remaining Longevity: 78 mo
Battery Remaining Percentage: 84 %
Brady Statistic RA Percent Paced: 75 %
Brady Statistic RV Percent Paced: 41 %
Date Time Interrogation Session: 20210526002100
Implantable Lead Implant Date: 20150603
Implantable Lead Implant Date: 20150603
Implantable Lead Location: 753859
Implantable Lead Location: 753860
Implantable Lead Model: 4136
Implantable Lead Model: 4137
Implantable Lead Serial Number: 29477746
Implantable Lead Serial Number: 29617326
Implantable Pulse Generator Implant Date: 20150603
Lead Channel Impedance Value: 566 Ohm
Lead Channel Impedance Value: 779 Ohm
Lead Channel Pacing Threshold Amplitude: 0.7 V
Lead Channel Pacing Threshold Amplitude: 1.8 V
Lead Channel Pacing Threshold Pulse Width: 0.4 ms
Lead Channel Pacing Threshold Pulse Width: 0.5 ms
Lead Channel Setting Pacing Amplitude: 2 V
Lead Channel Setting Pacing Amplitude: 2.2 V
Lead Channel Setting Pacing Pulse Width: 0.4 ms
Lead Channel Setting Sensing Sensitivity: 2.5 mV
Pulse Gen Serial Number: 389736

## 2019-06-13 NOTE — Progress Notes (Signed)
Remote pacemaker transmission.   

## 2019-08-05 ENCOUNTER — Other Ambulatory Visit: Payer: Self-pay | Admitting: Psychiatry

## 2019-08-05 DIAGNOSIS — F3175 Bipolar disorder, in partial remission, most recent episode depressed: Secondary | ICD-10-CM

## 2019-09-03 ENCOUNTER — Other Ambulatory Visit: Payer: Self-pay | Admitting: Psychiatry

## 2019-09-03 DIAGNOSIS — F3175 Bipolar disorder, in partial remission, most recent episode depressed: Secondary | ICD-10-CM

## 2019-09-03 DIAGNOSIS — F4312 Post-traumatic stress disorder, chronic: Secondary | ICD-10-CM

## 2019-09-11 ENCOUNTER — Ambulatory Visit (INDEPENDENT_AMBULATORY_CARE_PROVIDER_SITE_OTHER): Payer: Medicare Other | Admitting: *Deleted

## 2019-09-11 ENCOUNTER — Other Ambulatory Visit: Payer: Self-pay | Admitting: Psychiatry

## 2019-09-11 DIAGNOSIS — I495 Sick sinus syndrome: Secondary | ICD-10-CM

## 2019-09-11 DIAGNOSIS — F3175 Bipolar disorder, in partial remission, most recent episode depressed: Secondary | ICD-10-CM

## 2019-09-12 LAB — CUP PACEART REMOTE DEVICE CHECK
Battery Remaining Longevity: 72 mo
Battery Remaining Percentage: 80 %
Brady Statistic RA Percent Paced: 78 %
Brady Statistic RV Percent Paced: 43 %
Date Time Interrogation Session: 20210825002200
Implantable Lead Implant Date: 20150603
Implantable Lead Implant Date: 20150603
Implantable Lead Location: 753859
Implantable Lead Location: 753860
Implantable Lead Model: 4136
Implantable Lead Model: 4137
Implantable Lead Serial Number: 29477746
Implantable Lead Serial Number: 29617326
Implantable Pulse Generator Implant Date: 20150603
Lead Channel Impedance Value: 575 Ohm
Lead Channel Impedance Value: 729 Ohm
Lead Channel Pacing Threshold Amplitude: 0.7 V
Lead Channel Pacing Threshold Amplitude: 1.7 V
Lead Channel Pacing Threshold Pulse Width: 0.4 ms
Lead Channel Pacing Threshold Pulse Width: 0.5 ms
Lead Channel Setting Pacing Amplitude: 2 V
Lead Channel Setting Pacing Amplitude: 2.1 V
Lead Channel Setting Pacing Pulse Width: 0.4 ms
Lead Channel Setting Sensing Sensitivity: 2.5 mV
Pulse Gen Serial Number: 389736

## 2019-09-16 NOTE — Progress Notes (Signed)
Remote pacemaker transmission.   

## 2019-09-19 NOTE — Progress Notes (Signed)
Dr. Lovena Le would like to move the patients apt. Up to October.  Thanks.

## 2019-09-30 ENCOUNTER — Other Ambulatory Visit: Payer: Self-pay

## 2019-09-30 ENCOUNTER — Ambulatory Visit (INDEPENDENT_AMBULATORY_CARE_PROVIDER_SITE_OTHER): Payer: Medicare Other | Admitting: Otolaryngology

## 2019-09-30 VITALS — Temp 97.0°F

## 2019-09-30 DIAGNOSIS — H6123 Impacted cerumen, bilateral: Secondary | ICD-10-CM

## 2019-09-30 NOTE — Progress Notes (Signed)
HPI: Victor Osborne is a 86 y.o. male who presents for evaluation of blockage of his ears with wax secondary to psoriasis which is worse on the right side..  Past Medical History:  Diagnosis Date  . Bipolar 1 disorder ([place])   . COPD (chronic obstructive pulmonary disease) ([place])   . Depression   . First degree AV block   . Hypertension    pt denies but was in medical record  . Hypoglycemia, unspecified   . Myalgia and myositis, unspecified   . Obesity   . Pneumonia 1970  . Skin cancer (melanoma) ([place])   . Thyroid disease   . Unspecified hypothyroidism   . UPJ obstruction, congenital    Past Surgical History:  Procedure Laterality Date  . APPENDECTOMY  1939  . malignant melanoma removed from right forehead  2008  . PERMANENT PACEMAKER INSERTION N/A 06/19/2013   Procedure: PERMANENT PACEMAKER INSERTION;  Surgeon: Evans Lance, MD;  Location: [facility] CATH LAB;  Service: Cardiovascular;  Laterality: N/A;  . right big toe  surgery  2009  . TRANSURETHRAL RESECTION OF PROSTATE  02/04/2011   Procedure: TRANSURETHRAL RESECTION OF THE PROSTATE (TURP);  Surgeon: Dutch Gray, MD;  Location: [facility] ORS;  Service: Urology;  Laterality: N/A;  . URETHRA SURGERY  2010   reconstruction   Social History   Socioeconomic History  . Marital status: Widowed    Spouse name: Not on file  . Number of children: Not on file  . Years of education: Not on file  . Highest education level: Not on file  Occupational History  . Occupation: retired  Tobacco Use  . Smoking status: Former Smoker    Packs/day: 2.00    Years: 40.00    Pack years: 80.00    Types: Cigarettes  . Smokeless tobacco: Never Used  Vaping Use  . Vaping Use: Never used  Substance and Sexual Activity  . Alcohol use: No  . Drug use: No  . Sexual activity: Not Currently  Other Topics Concern  . 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      Spiritual  Beliefs: episcopalian      Lifestyle: active with yard work; diet ok   Social Determinants of Radio broadcast assistant Strain:   . Difficulty of Paying Living Expenses: Not on file  Food Insecurity:   . Worried About Charity fundraiser in the Last Year: Not on file  . Ran Out of Food in the Last Year: Not on file  Transportation Needs:   . Lack of Transportation (Medical): Not on file  . Lack of Transportation (Non-Medical): Not on file  Physical Activity:   . Days of Exercise per Week: Not on file  . Minutes of Exercise per Session: Not on file  Stress:   . Feeling of Stress : Not on file  Social Connections:   . Frequency of Communication with Friends and Family: Not on file  . Frequency of Social Gatherings with Friends and Family: Not on file  . Attends Religious Services: Not on file  . Active Member of Clubs or Organizations: Not on file  . Attends Archivist Meetings: Not on file  . Marital Status: Not on file   Family History  Problem Relation Age of Onset  . Cerebral aneurysm Mother   . Lung cancer Father   . Drug abuse Daughter  No Known Allergies Prior to Admission medications   Medication Sig Start Date End Date Taking? Authorizing Provider  alclomethasone (ACLOVATE) 0.05 % ointment Apply 1 application topically 2 (two) times daily as needed (for rash).   Yes [provider]  carbamazepine (TEGRETOL) 200 MG tablet TAKE 2 TABLETS TWICE DAILY 09/12/19  Yes Delight Hoh, MD  carbidopa-levodopa (SINEMET IR) 25-100 MG tablet Take 1 tablet by mouth 3 (three) times daily. Patient taking differently: Take 2 tablets by mouth 3 (three) times daily.  10/03/16  Yes Tat, Eustace Quail, DO  cholecalciferol (VITAMIN D) 1000 UNITS tablet Take 2,000 Units by mouth daily.    Yes [provider]  desoximetasone (TOPICORT) 0.25 % cream Apply 1 application topically 2 (two) times daily.   Yes [provider]  Flaxseed, Linseed, (FLAX SEED  OIL) 1300 MG CAPS Take 1 capsule by mouth daily.    Yes [provider]  FLUoxetine (PROZAC) 20 MG capsule TAKE 1 CAPSULE EVERY DAY 09/06/19  Yes Delight Hoh, MD  levothyroxine (LEVOTHROID) 125 MCG tablet Take 1 tablet (125 mcg total) by mouth daily before breakfast. 10/01/15  Yes [name], DO  Multiple Vitamin (MULITIVITAMIN WITH MINERALS) TABS Take 1 tablet by mouth daily.   Yes [provider]  Omega-3 Fatty Acids (OMEGA-3 CF PO) Take 1,200 mg by mouth daily.    Yes [provider]  pravastatin (PRAVACHOL) 40 MG tablet Take 1 tablet (40 mg total) by mouth daily. 10/01/15  Yes [name] R, DO  QUEtiapine (SEROQUEL XR) 50 MG TB24 24 hr tablet TAKE 1 TABLET AT BEDTIME 08/05/19  Yes Delight Hoh, MD     Positive ROS: Otherwise negative  All other systems have been reviewed and were otherwise negative with the exception of those mentioned in the HPI and as above.  Physical Exam: Constitutional: Alert, well-appearing, no acute distress Ears: External ears without lesions or tenderness. Ear canals were cleaned bilaterally with suction and curettes. The right side was worse than the left. After cleaning the right ear canal I applied gentian violet.. Nasal: External nose without lesions. Clear nasal passages Oral: Oropharynx clear. Neck: No palpable adenopathy or masses Respiratory: Breathing comfortably  Skin: No facial/neck lesions or rash noted.  Cerumen impaction removal  Date/Time: 09/30/2019 5:30 PM Performed by: Rozetta Nunnery, MD Authorized by: Rozetta Nunnery, MD   Consent:    Consent obtained:  Verbal   Consent given by:  Patient   Risks discussed:  Pain and bleeding Procedure details:    Location:  L ear and R ear   Procedure type: curette and suction   Post-procedure details:    Inspection:  TM intact and canal normal   Hearing quality:  Improved   Patient tolerance of procedure:  Tolerated well, no immediate  complications Comments:     TMs are clear bilaterally.    Assessment: Wax buildup secondary to psoriasis  Plan: He will follow-up as needed. He was last cleaned in March of this year.  Radene Journey, MD

## 2019-10-03 NOTE — Progress Notes (Signed)
Assessment/Plan:   1.  Parkinsons Disease  -Continue carbidopa/levodopa 25/100, 2 tablets 3 times per day  2.  History of melanoma  -Follows with Dr. Delman Cheadle.  Patient understands that Parkinson's also increases risk for melanoma.  3.  History of bipolar disorder  -Well-controlled.  Patient under the care of psychiatry.  On Tegretol and Seroquel XR, 50 mg at bed   Subjective:   Victor Osborne was seen today in follow up for Parkinsons disease.  My previous records were reviewed prior to todays visit as well as outside records available to me. Pt denies falls.  Pt denies lightheadedness, near syncope.  No hallucinations.  Mood has been fair.  Exercise consists of "chasing my damn dog."    Current prescribed movement disorder medications: Carbidopa/levodopa 25/100, 2 tablets 3 times per day   ALLERGIES:  No Known Allergies  CURRENT MEDICATIONS:  Outpatient Encounter Medications as of 10/11/2019  Medication Sig  . alclomethasone (ACLOVATE) 0.05 % ointment Apply 1 application topically 2 (two) times daily as needed (for rash).  . carbamazepine (TEGRETOL) 200 MG tablet TAKE 2 TABLETS TWICE DAILY  . carbidopa-levodopa (SINEMET IR) 25-100 MG tablet Take 2 tablets by mouth 3 (three) times daily.  . cholecalciferol (VITAMIN D) 1000 UNITS tablet Take 2,000 Units by mouth daily.   Marland Kitchen desoximetasone (TOPICORT) 0.25 % cream Apply 1 application topically 2 (two) times daily.  . Flaxseed, Linseed, (FLAX SEED OIL) 1300 MG CAPS Take 1 capsule by mouth daily.   Marland Kitchen FLUoxetine (PROZAC) 20 MG capsule TAKE 1 CAPSULE EVERY DAY  . levothyroxine (LEVOTHROID) 125 MCG tablet Take 1 tablet (125 mcg total) by mouth daily before breakfast.  . Multiple Vitamin (MULITIVITAMIN WITH MINERALS) TABS Take 1 tablet by mouth daily.  . Omega-3 Fatty Acids (OMEGA-3 CF PO) Take 1,200 mg by mouth daily.   . pravastatin (PRAVACHOL) 40 MG tablet Take 1 tablet (40 mg total) by mouth daily.  . QUEtiapine (SEROQUEL XR) 50  MG TB24 24 hr tablet TAKE 1 TABLET AT BEDTIME  . [DISCONTINUED] carbidopa-levodopa (SINEMET IR) 25-100 MG tablet Take 1 tablet by mouth 3 (three) times daily. (Patient taking differently: Take 2 tablets by mouth 3 (three) times daily. )   No facility-administered encounter medications on file as of 10/11/2019.    Objective:   PHYSICAL EXAMINATION:    VITALS:   Vitals:   10/11/19 0831  BP: (!) 178/80  Pulse: 64  SpO2: 100%  Weight: 201 lb (91.2 kg)  Height: 6\' 4"  (1.93 m)    GEN:  The patient appears stated age and is in NAD. HEENT:  Normocephalic, atraumatic.  The mucous membranes are moist. The superficial temporal arteries are without ropiness or tenderness. CV:  RRR Lungs:  CTAB Neck/HEME:  There are no carotid bruits bilaterally.  Neurological examination:  Orientation: The patient is alert and oriented x3. Cranial nerves: There is good facial symmetry with facial hypomimia. The speech is fluent and clear and hypophonic. Soft palate rises symmetrically and there is no tongue deviation. Hearing is intact to conversational tone. Sensation: Sensation is intact to light touch throughout Motor: Strength is at least antigravity x4.  Movement examination: Tone: There is min increased tone in the RUE  Abnormal movements: none Coordination:  There is no decremation with RAM's, with any form of RAMS, including alternating supination and pronation of the forearm, hand opening and closing, finger taps, heel taps and toe taps. Gait and Station: The patient has no difficulty arising out of a  deep-seated chair without the use of the hands. The patient's stride length is good.    I have reviewed and interpreted the following labs independently    Chemistry      Component Value Date/Time   NA 143 01/16/2019 0838   K 4.2 01/16/2019 0838   CL 104 01/16/2019 0838   CO2 25 01/16/2019 0838   BUN 15 01/16/2019 0838   CREATININE 1.24 01/16/2019 0838      Component Value Date/Time    CALCIUM 9.1 01/16/2019 0838   ALKPHOS 110 01/16/2019 0838   AST 21 01/16/2019 0838   ALT 15 01/16/2019 0838   BILITOT 0.3 01/16/2019 0838       Lab Results  Component Value Date   WBC 3.6 01/16/2019   HGB 13.4 01/16/2019   HCT 41.1 01/16/2019   MCV 97 01/16/2019   PLT 188 01/16/2019    Lab Results  Component Value Date   TSH 3.920 01/16/2019     Total time spent on today's visit was 25 minutes, including both face-to-face time and nonface-to-face time.  Time included that spent on review of records (prior notes available to me/labs/imaging if pertinent), discussing treatment and goals, answering patient's questions and coordinating care.  Cc:  Patient, No Pcp Per

## 2019-10-07 ENCOUNTER — Ambulatory Visit (INDEPENDENT_AMBULATORY_CARE_PROVIDER_SITE_OTHER): Payer: Medicare Other | Admitting: Otolaryngology

## 2019-10-11 ENCOUNTER — Ambulatory Visit (INDEPENDENT_AMBULATORY_CARE_PROVIDER_SITE_OTHER): Payer: Medicare Other | Admitting: Neurology

## 2019-10-11 ENCOUNTER — Encounter: Payer: Self-pay | Admitting: Neurology

## 2019-10-11 ENCOUNTER — Other Ambulatory Visit: Payer: Self-pay

## 2019-10-11 VITALS — BP 154/77 | HR 64 | Ht 76.0 in | Wt 201.0 lb

## 2019-10-11 DIAGNOSIS — G20 Parkinson's disease: Secondary | ICD-10-CM

## 2019-10-11 NOTE — Patient Instructions (Signed)
You look great!  Continue carbidopa/levodopa 25/100, 2 tablets at 7am/11am/4pm.  Exercise!!  The physicians and staff at [facility] are committed to providing excellent care. You may receive a survey requesting feedback about your experience at our office. We strive to receive "very good" responses to the survey questions. If you feel that your experience would prevent you from giving the office a "very good " response, please contact our office to try to remedy the situation. We may be reached at [phone]. Thank you for taking the time out of your busy day to complete the survey.

## 2019-11-01 ENCOUNTER — Ambulatory Visit: Payer: Medicare Other | Admitting: Neurology

## 2019-11-07 ENCOUNTER — Encounter: Payer: Self-pay | Admitting: Psychiatry

## 2019-11-08 ENCOUNTER — Encounter: Payer: Medicare Other | Admitting: Internal Medicine

## 2019-12-04 ENCOUNTER — Encounter: Payer: Self-pay | Admitting: Internal Medicine

## 2019-12-04 ENCOUNTER — Other Ambulatory Visit: Payer: Self-pay

## 2019-12-04 ENCOUNTER — Ambulatory Visit (INDEPENDENT_AMBULATORY_CARE_PROVIDER_SITE_OTHER): Payer: Medicare Other | Admitting: Internal Medicine

## 2019-12-04 VITALS — BP 126/80 | HR 65 | Ht 76.0 in | Wt 206.0 lb

## 2019-12-04 DIAGNOSIS — Z950 Presence of cardiac pacemaker: Secondary | ICD-10-CM | POA: Diagnosis not present

## 2019-12-04 DIAGNOSIS — I495 Sick sinus syndrome: Secondary | ICD-10-CM | POA: Diagnosis not present

## 2019-12-04 NOTE — Progress Notes (Signed)
HPI Victor Osborne today for ongoing evaluation and management of his permanent pacemaker status post insertion over 6 years ago. The patient is a very pleasant 86 year old man who has symptomatic sinus node dysfunction. He has done well in the interim. He denies chest pain or shortness of breath. No syncope. No peripheral edema. He has bothered by a resting tremor.He notes that he has lost his wife who had dementia.   No Known Allergies   Current Outpatient Medications  Medication Sig Dispense Refill  . alclomethasone (ACLOVATE) 0.05 % ointment Apply 1 application topically 2 (two) times daily as needed (for rash).    . carbamazepine (TEGRETOL) 200 MG tablet TAKE 2 TABLETS TWICE DAILY 360 tablet 0  . carbidopa-levodopa (SINEMET IR) 25-100 MG tablet Take 2 tablets by mouth 3 (three) times daily.    . cholecalciferol (VITAMIN D) 1000 UNITS tablet Take 2,000 Units by mouth daily.     Marland Kitchen desoximetasone (TOPICORT) 0.25 % cream Apply 1 application topically 2 (two) times daily.    . Flaxseed, Linseed, (FLAX SEED OIL) 1300 MG CAPS Take 1 capsule by mouth daily.     Marland Kitchen FLUoxetine (PROZAC) 20 MG capsule TAKE 1 CAPSULE EVERY DAY 90 capsule 1  . levothyroxine (LEVOTHROID) 125 MCG tablet Take 1 tablet (125 mcg total) by mouth daily before breakfast. 90 tablet 3  . Multiple Vitamin (MULITIVITAMIN WITH MINERALS) TABS Take 1 tablet by mouth daily.    . Omega-3 Fatty Acids (OMEGA-3 CF PO) Take 1,200 mg by mouth daily.     . pravastatin (PRAVACHOL) 40 MG tablet Take 1 tablet (40 mg total) by mouth daily. 90 tablet 3  . QUEtiapine (SEROQUEL XR) 50 MG TB24 24 hr tablet TAKE 1 TABLET AT BEDTIME 90 tablet 0   No current facility-administered medications for this visit.     Past Medical History:  Diagnosis Date  . Bipolar 1 disorder ([place])   . COPD (chronic obstructive pulmonary disease) ([place])   . Depression   . First degree AV block   . Hypertension    pt denies but was in medical record  .  Hypoglycemia, unspecified   . Myalgia and myositis, unspecified   . Obesity   . Pneumonia 1970  . Skin cancer (melanoma) ([place])   . Thyroid disease   . Unspecified hypothyroidism   . UPJ obstruction, congenital     ROS:   All systems reviewed and negative except as noted in the HPI.   Past Surgical History:  Procedure Laterality Date  . APPENDECTOMY  1939  . malignant melanoma removed from right forehead  2008  . PERMANENT PACEMAKER INSERTION N/A 06/19/2013   Procedure: PERMANENT PACEMAKER INSERTION;  Surgeon: Evans Lance, MD;  Location: [facility] CATH LAB;  Service: Cardiovascular;  Laterality: N/A;  . right big toe  surgery  2009  . TRANSURETHRAL RESECTION OF PROSTATE  02/04/2011   Procedure: TRANSURETHRAL RESECTION OF THE PROSTATE (TURP);  Surgeon: Dutch Gray, MD;  Location: [facility] ORS;  Service: Urology;  Laterality: N/A;  . URETHRA SURGERY  2010   reconstruction     Family History  Problem Relation Age of Onset  . Cerebral aneurysm Mother   . Lung cancer Father   . Drug abuse Daughter      Social History   Socioeconomic History  . Marital status: Widowed    Spouse name: Not on file  . Number of children: Not on file  . Years of education: Not on file  .  Highest education level: Not on file  Occupational History  . Occupation: retired  Tobacco Use  . Smoking status: Former Smoker    Packs/day: 2.00    Years: 40.00    Pack years: 80.00    Types: Cigarettes  . Smokeless tobacco: Never Used  Vaping Use  . Vaping Use: Never used  Substance and Sexual Activity  . Alcohol use: No  . Drug use: No  . Sexual activity: Not Currently  Other Topics Concern  . 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 Difficulty of Paying Living Expenses: Not on  file  Food Insecurity:   . Worried About Charity fundraiser in the Last Year: Not on file  . Ran Out of Food in the Last Year: Not on file  Transportation Needs:   . Lack of Transportation (Medical): Not on file  . Lack of Transportation (Non-Medical): Not on file  Physical Activity:   . Days of Exercise per Week: Not on file  . Minutes of Exercise per Session: Not on file  Stress:   . Feeling of Stress : Not on file  Social Connections:   . Frequency of Communication with Friends and Family: Not on file  . Frequency of Social Gatherings with Friends and Family: Not on file  . Attends Religious Services: Not on file  . Active Member of Clubs or Organizations: Not on file  . Attends Archivist Meetings: Not on file  . Marital Status: Not on file  Intimate Partner Violence:   . Fear of Current or Ex-Partner: Not on file  . Emotionally Abused: Not on file  . Physically Abused: Not on file  . Sexually Abused: Not on file     BP 126/80   Pulse 65   Ht 6\' 4"  (1.93 m)   Wt 206 lb (93.4 kg)   SpO2 96%   BMI 25.08 kg/m   Physical Exam:  Well appearing NAD HEENT: Unremarkable Neck:  No JVD, no thyromegally Lymphatics:  No adenopathy Back:  No CVA tenderness Lungs:  Clear with no wheezes HEART:  Regular rate rhythm, no murmurs, no rubs, no clicks Abd:  soft, positive bowel sounds, no organomegally, no rebound, no guarding Ext:  2 plus pulses, no edema, no cyanosis, no clubbing Skin:  No rashes no nodules Neuro:  CN II through XII intact, motor grossly intact  EKG - NSR with inferolateral T wave inversions  DEVICE  Normal device function.  See PaceArt for details.   Assess/Plan: 1. CHB/sinus node dysfunction - he has both and is pacing much of the time. He is asymptomatic. 2. PPM -his [employer] DDD PM is working normally. 3. HTN - his bp is well controlled.   Victor Overlie Lurlie Wigen,MD

## 2019-12-04 NOTE — Patient Instructions (Signed)
Medication Instructions:  Your physician recommends that you continue on your current medications as directed. Please refer to the Current Medication list given to you today.  Labwork: None ordered.  Testing/Procedures: None ordered.  Follow-Up: Your physician wants you to follow-up in: one year with Dr. Lovena Le.  You will receive a reminder letter in the mail two months in advance. If you don't receive a letter, please call our office to schedule the follow-up appointment.  Remote monitoring is used to monitor your Pacemaker from home. This monitoring reduces the number of office visits required to check your device to one time per year. It allows [place] to keep an eye on the functioning of your device to ensure it is working properly. You are scheduled for a device check from home on 12/11/2019. You may send your transmission at any time that day. If you have a wireless device, the transmission will be sent automatically. After your physician reviews your transmission, you will receive a postcard with your next transmission date.  Any Other Special Instructions Will Be Listed Below (If Applicable).  If you need a refill on your cardiac medications before your next appointment, please call your pharmacy.

## 2019-12-09 ENCOUNTER — Other Ambulatory Visit: Payer: Self-pay

## 2019-12-09 ENCOUNTER — Ambulatory Visit (INDEPENDENT_AMBULATORY_CARE_PROVIDER_SITE_OTHER): Payer: Medicare Other | Admitting: Psychiatry

## 2019-12-09 ENCOUNTER — Encounter: Payer: Self-pay | Admitting: Psychiatry

## 2019-12-09 VITALS — Ht 76.0 in | Wt 204.0 lb

## 2019-12-09 DIAGNOSIS — F4312 Post-traumatic stress disorder, chronic: Secondary | ICD-10-CM

## 2019-12-09 DIAGNOSIS — F3175 Bipolar disorder, in partial remission, most recent episode depressed: Secondary | ICD-10-CM

## 2019-12-09 MED ORDER — FLUOXETINE HCL 10 MG PO CAPS: 10.0000 mg | ORAL_CAPSULE | Freq: Every day | ORAL | 1 refills | Status: DC

## 2019-12-09 MED ORDER — QUETIAPINE FUMARATE ER 50 MG PO TB24: 50.0000 mg | ORAL_TABLET | Freq: Every day | ORAL | 1 refills | Status: DC

## 2019-12-09 MED ORDER — CARBAMAZEPINE 200 MG PO TABS: 200.0000 mg | ORAL_TABLET | Freq: Two times a day (BID) | ORAL | 1 refills | Status: DC

## 2019-12-09 NOTE — Progress Notes (Signed)
[facility]  Patient ID: Victor Osborne,  MRN: [id number]  PCP: Patient, No Pcp Per  Date of Evaluation: 12/09/2019 Time spent:20 minutes from 1000 to 1020  Chief Complaint:  Chief Complaint    Depression; Manic Behavior; Anxiety; Trauma      HISTORY/CURRENT STATUS: Rush Landmark is seen onsite in office 20 minutes face-to-face individually with consent with epic collateral for psychiatric interview and exam driving himself to the appointment in 37-month evaluation and management of bipolar most recently depressed and chronic PTSD with medical disorders of Parkinson's, sinoatrial node dysfunction requiring pacemaker, hyperlipidemia, and hypothyroidism.  Patient is much more complete in his mourning for decease of wife with Parkinson's far worse than his.  He shares residence with live-in girlfriend of 1 year at [facility] though he clarifies their boundaries and finances.  The patient drives effectively still and manages his affairs staying away from other associations for past manic episodes.  He continues Seroquel 50 mg nightly and we reduced his Prozac from 40 mg to 20 mg daily, as his previous long term Tegretol as 5 of the 200 mg IR tablets daily has been reduced last spring to 4 tablets daily now seeking to reduce further considering particularly that [facility] mail service does not cover that medication at all.  He has no mania, suicidality, psychosis or delirium today. After 2 years of care in this office, patient faces relative loss of case closure of my care for my imminent retirement processed today for completeness including addressing transition transfer to advanced practitioner in this office.  Depression The patient presents withdepressionasa recurrentproblemwithmost recent episode startingmore than 78monthago. The onset quality is sudden occuringintermittently.The most recent episode lastedmonths. The problem has been waxing  and waningsince onset now improved.Associated symptoms include decreased concentration, insomnia,appetite changeand social satire compensating for reactive sadness. Associated symptoms include no restlessness,no morbidguilt, noirritability,nodecreased interest, no body aches,no headachesand no suicidal ideas.The symptoms are aggravated by family issues and social issues.Past treatments include SSRIs - Selective serotonin reuptake inhibitors and other medications.Compliance with treatment is good.Past compliance problems include difficulty with treatment plan and medication issues.Previous treatment provided moderaterelief.Risk factors include a change in medication usage/dosage, prior traumatic experience, stress, family history of mental illness, family history, major life event and prior psychiatric admission. Past medical history includes chronic pain,chronic illness,physical disability,anxiety,bipolar disorder,depression,mental health disorderand post-traumatic stress disorder. Pertinent negatives includeno recent psychiatric admission,no Alzheimer's disease,no brain trauma,no dementia,no eating disorder,no obsessive-compulsive disorder,no schizophrenia,no suicide attemptsand no head trauma.   Individual Medical History/ Review of Systems: Changes? :Yes with restoration of 18 pounds in the interim 6 months after losing weight with losing wife.  Patient is up-to-date with primary care, neurology, and cardiology.  Allergies: Patient has no known allergies.  Current Medications:  Current Outpatient Medications:  .  alclomethasone (ACLOVATE) 0.05 % ointment, Apply 1 application topically 2 (two) times daily as needed (for rash)., Disp: , Rfl:  .  carbamazepine (TEGRETOL) 200 MG tablet, Take 1 tablet (200 mg total) by mouth 2 (two) times daily., Disp: 180 tablet, Rfl: 1 .  carbidopa-levodopa (SINEMET IR) 25-100 MG tablet, Take 2 tablets by mouth 3 (three)  times daily., Disp: , Rfl:  .  cholecalciferol (VITAMIN D) 1000 UNITS tablet, Take 2,000 Units by mouth daily. , Disp: , Rfl:  .  desoximetasone (TOPICORT) 0.25 % cream, Apply 1 application topically 2 (two) times daily., Disp: , Rfl:  .  Flaxseed, Linseed, (FLAX SEED OIL) 1300 MG CAPS, Take 1 capsule by mouth daily. ,  Disp: , Rfl:  .  FLUoxetine (PROZAC) 10 MG capsule, Take 1 capsule (10 mg total) by mouth daily., Disp: 90 capsule, Rfl: 1 .  levothyroxine (LEVOTHROID) 125 MCG tablet, Take 1 tablet (125 mcg total) by mouth daily before breakfast., Disp: 90 tablet, Rfl: 3 .  Multiple Vitamin (MULITIVITAMIN WITH MINERALS) TABS, Take 1 tablet by mouth daily., Disp: , Rfl:  .  Omega-3 Fatty Acids (OMEGA-3 CF PO), Take 1,200 mg by mouth daily. , Disp: , Rfl:  .  pravastatin (PRAVACHOL) 40 MG tablet, Take 1 tablet (40 mg total) by mouth daily., Disp: 90 tablet, Rfl: 3 .  QUEtiapine (SEROQUEL XR) 50 MG TB24 24 hr tablet, Take 1 tablet (50 mg total) by mouth at bedtime., Disp: 90 tablet, Rfl: 1   Medication Side Effects: none   Family Medical/ Social History: Changes? No as mood arising medication was recommended for patient's mother by the psychiatrist initially treating the patient's bipolar disorder.  Both parents were stressful to the children in the family by their problems they otherwise denied or refused to disclosure  MENTAL HEALTH EXAM:  Height 6\' 4"  (1.93 m), weight 204 lb (92.5 kg).Body mass index is 24.83 kg/m. AIMS = 0 today  General Appearance: Casual, Fairly Groomed and Meticulous  Eye Contact:  Good  Speech:  Clear and Coherent, Normal Rate and Talkative  Volume:  Normal  Mood:  Anxious, Dysphoric and Euthymic  Affect:  Congruent, Inappropriate and Full Range  Thought Process:  Coherent, Goal Directed, Irrelevant and Descriptions of Associations: Tangential  Orientation:  Full (Time, Place, and Person)  Thought Content: Rumination and Tangential   Suicidal Thoughts:  No   Homicidal Thoughts:  No  Memory:  Immediate;   Good Remote;   Good  Judgement:  Fair  Insight:  Good  Psychomotor Activity:  Normal and Mannerisms  Concentration:  Concentration: Good and Attention Span: Good  Recall:  Good  Fund of Knowledge: Good  Language: Good  Assets:  Desire for Improvement Leisure Time Resilience Social Support Talents/Skills  ADL's:  Intact  Cognition: WNL  Prognosis:  Fair    DIAGNOSES:    ICD-10-CM   1. Bipolar I disorder, current or most recent episode depressed, in partial remission (HCC)  F31.75 carbamazepine (TEGRETOL) 200 MG tablet    QUEtiapine (SEROQUEL XR) 50 MG TB24 24 hr tablet    FLUoxetine (PROZAC) 10 MG capsule  2. 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  The potential side effects and cost of his medications are now approaching is significant as the mental disorders.  Most important is to continue the Seroquel and low-dose Prozac to hopefully be able to discontinue the Tegretol slowly over the next year.  At this time he is E scribed Tegretol 200 mg IR tablet taking 2 every morning and 1 every evening for 1 month and then reduce to 1 every morning and evening sent as #180 with 1 refill to [facility] mail service for bipolar disorder.  He is E scribed Seroquel 50 mg XR every bedtime sent as #90 with 1 refill to [facility] mail service for bipolar and PTSD.  His Prozac is reduced to the 10 mg capsule every morning sent as #90 with 1 refill to [facility] mail service for posttraumatic anxiety and  depression.  Closure of my care to transition transfer to advanced practitioner in this office for follow-up in 6 months is completed along with prevention and monitoring safety hygiene  education on medications and diagnoses.  Delight Hoh, MD

## 2019-12-11 ENCOUNTER — Ambulatory Visit (INDEPENDENT_AMBULATORY_CARE_PROVIDER_SITE_OTHER): Payer: Medicare Other

## 2019-12-11 DIAGNOSIS — I495 Sick sinus syndrome: Secondary | ICD-10-CM

## 2019-12-11 LAB — CUP PACEART REMOTE DEVICE CHECK
Battery Remaining Longevity: 66 mo
Battery Remaining Percentage: 74 %
Brady Statistic RA Percent Paced: 93 %
Brady Statistic RV Percent Paced: 59 %
Date Time Interrogation Session: 20211124003500
Implantable Lead Implant Date: 20150603
Implantable Lead Implant Date: 20150603
Implantable Lead Location: 753859
Implantable Lead Location: 753860
Implantable Lead Model: 4136
Implantable Lead Model: 4137
Implantable Lead Serial Number: 29477746
Implantable Lead Serial Number: 29617326
Implantable Pulse Generator Implant Date: 20150603
Lead Channel Impedance Value: 562 Ohm
Lead Channel Impedance Value: 719 Ohm
Lead Channel Pacing Threshold Amplitude: 0.6 V
Lead Channel Pacing Threshold Amplitude: 1.7 V
Lead Channel Pacing Threshold Pulse Width: 0.4 ms
Lead Channel Pacing Threshold Pulse Width: 0.5 ms
Lead Channel Setting Pacing Amplitude: 1.9 V
Lead Channel Setting Pacing Amplitude: 2 V
Lead Channel Setting Pacing Pulse Width: 0.4 ms
Lead Channel Setting Sensing Sensitivity: 2.5 mV
Pulse Gen Serial Number: 389736

## 2019-12-16 ENCOUNTER — Other Ambulatory Visit: Payer: Self-pay

## 2019-12-16 ENCOUNTER — Other Ambulatory Visit: Payer: Self-pay | Admitting: Neurology

## 2019-12-16 MED ORDER — CARBIDOPA-LEVODOPA 25-100 MG PO TABS
2.0000 | ORAL_TABLET | Freq: Three times a day (TID) | ORAL | 3 refills | Status: DC
Start: 2019-12-16 — End: 2020-03-20

## 2019-12-16 NOTE — Telephone Encounter (Signed)
Refills sent to pharmacy on file.

## 2019-12-20 NOTE — Progress Notes (Signed)
Remote pacemaker transmission.   

## 2019-12-23 ENCOUNTER — Telehealth: Payer: Self-pay | Admitting: Psychiatry

## 2019-12-23 DIAGNOSIS — F3175 Bipolar disorder, in partial remission, most recent episode depressed: Secondary | ICD-10-CM

## 2019-12-23 MED ORDER — QUETIAPINE FUMARATE ER 50 MG PO TB24: 50.0000 mg | ORAL_TABLET | Freq: Every day | ORAL | 3 refills | Status: DC

## 2019-12-23 NOTE — Telephone Encounter (Signed)
[facility] mail service pharmacy sends renewal and confirmation of quetiapine 50 mg ER every bedtime noting that there may be a negative financial impact on the formulation for 2022 without other clarification.  We have been reducing his carbamazepine because they do not cover that medication, therefore we must maintain the extended release if possible, though if financially devastating for patient we change back to the IR not successful in the past noting supply #90 with 3 refills authorized.

## 2020-01-09 ENCOUNTER — Ambulatory Visit (INDEPENDENT_AMBULATORY_CARE_PROVIDER_SITE_OTHER): Payer: Medicare Other | Admitting: Otolaryngology

## 2020-03-11 ENCOUNTER — Ambulatory Visit (INDEPENDENT_AMBULATORY_CARE_PROVIDER_SITE_OTHER): Payer: Medicare Other

## 2020-03-11 DIAGNOSIS — I440 Atrioventricular block, first degree: Secondary | ICD-10-CM

## 2020-03-12 LAB — CUP PACEART REMOTE DEVICE CHECK
Battery Remaining Longevity: 66 mo
Battery Remaining Percentage: 72 %
Brady Statistic RA Percent Paced: 85 %
Brady Statistic RV Percent Paced: 60 %
Date Time Interrogation Session: 20220223002100
Implantable Lead Implant Date: 20150603
Implantable Lead Implant Date: 20150603
Implantable Lead Location: 753859
Implantable Lead Location: 753860
Implantable Lead Model: 4136
Implantable Lead Model: 4137
Implantable Lead Serial Number: 29477746
Implantable Lead Serial Number: 29617326
Implantable Pulse Generator Implant Date: 20150603
Lead Channel Impedance Value: 550 Ohm
Lead Channel Impedance Value: 687 Ohm
Lead Channel Pacing Threshold Amplitude: 0.6 V
Lead Channel Pacing Threshold Amplitude: 1.4 V
Lead Channel Pacing Threshold Pulse Width: 0.4 ms
Lead Channel Pacing Threshold Pulse Width: 0.5 ms
Lead Channel Setting Pacing Amplitude: 1.9 V
Lead Channel Setting Pacing Amplitude: 2 V
Lead Channel Setting Pacing Pulse Width: 0.4 ms
Lead Channel Setting Sensing Sensitivity: 2.5 mV
Pulse Gen Serial Number: 389736

## 2020-03-18 NOTE — Progress Notes (Signed)
Remote pacemaker transmission.   

## 2020-03-20 ENCOUNTER — Other Ambulatory Visit: Payer: Self-pay | Admitting: Neurology

## 2020-03-20 MED ORDER — CARBIDOPA-LEVODOPA 25-100 MG PO TABS
2.0000 | ORAL_TABLET | Freq: Three times a day (TID) | ORAL | 0 refills | Status: DC
Start: 2020-03-20 — End: 2020-06-18

## 2020-03-20 NOTE — Telephone Encounter (Signed)
Rx(s) sent to pharmacy electronically.  

## 2020-04-07 NOTE — Progress Notes (Unsigned)
Assessment/Plan:   1.  Parkinsons Disease  -Continue carbidopa/levodopa 25/100, 2 tablets 3 times per day  2.  History of well-controlled bipolar  -Follows with psychiatry.  On Tegretol and Seroquel XR, 50 mg at bedtime  3.  History of melanoma  -Follows with Dr. Delman Cheadle  -Knows that Parkinson's slightly increases risk for future melanomas as well.   Subjective:   Victor Osborne was seen today in follow up for Parkinsons disease.  My previous records were reviewed prior to todays visit as well as outside records available to me. Pt denies falls but he does think that balance is getting worse and feels that he is more staggard.  Pt denies lightheadedness, near syncope.  No hallucinations.  Mood has been good.  Continues to follow with psychiatry for his bipolar disorder.  Last saw psychiatry in November.  Notes are reviewed.  Current prescribed movement disorder medications: Carbidopa/levodopa 25/100, 2 tablets 3 times per day   ALLERGIES:  No Known Allergies  CURRENT MEDICATIONS:  Outpatient Encounter Medications as of 04/09/2020  Medication Sig  . alclomethasone (ACLOVATE) 0.05 % ointment Apply 1 application topically 2 (two) times daily as needed (for rash).  . carbamazepine (TEGRETOL) 200 MG tablet Take 1 tablet (200 mg total) by mouth 2 (two) times daily.  . carbidopa-levodopa (SINEMET IR) 25-100 MG tablet Take 2 tablets by mouth 3 (three) times daily.  . cholecalciferol (VITAMIN D) 1000 UNITS tablet Take 2,000 Units by mouth daily.  Marland Kitchen desoximetasone (TOPICORT) 0.25 % cream Apply 1 application topically 2 (two) times daily.  . Flaxseed, Linseed, (FLAX SEED OIL) 1300 MG CAPS Take 1 capsule by mouth daily.  Marland Kitchen FLUoxetine (PROZAC) 10 MG capsule Take 1 capsule (10 mg total) by mouth daily.  Marland Kitchen levothyroxine (LEVOTHROID) 125 MCG tablet Take 1 tablet (125 mcg total) by mouth daily before breakfast.  . Multiple Vitamin (MULITIVITAMIN WITH MINERALS) TABS Take 1 tablet by mouth  daily.  . Omega-3 Fatty Acids (OMEGA-3 CF PO) Take 1,200 mg by mouth daily.  . pravastatin (PRAVACHOL) 40 MG tablet Take 1 tablet (40 mg total) by mouth daily.  . QUEtiapine (SEROQUEL XR) 50 MG TB24 24 hr tablet Take 1 tablet (50 mg total) by mouth at bedtime.   No facility-administered encounter medications on file as of 04/09/2020.    Objective:   PHYSICAL EXAMINATION:    VITALS:   Vitals:   04/09/20 1100  BP: 108/60  Pulse: 63  SpO2: 98%  Weight: 201 lb (91.2 kg)  Height: 6\' 4"  (1.93 m)    GEN:  The patient appears stated age and is in NAD. HEENT:  Normocephalic, atraumatic.  The mucous membranes are moist. The superficial temporal arteries are without ropiness or tenderness. CV:  RRR Lungs:  CTAB Neck/HEME:  There are no carotid bruits bilaterally.  Neurological examination:  Orientation: The patient is alert and oriented x3. Cranial nerves: There is good facial symmetry with minimal facial hypomimia. The speech is fluent and clear. Soft palate rises symmetrically and there is no tongue deviation. Hearing is intact to conversational tone. Sensation: Sensation is intact to light touch throughout Motor: Strength is at least antigravity x4.  Movement examination: Tone: There is nl tone in the UE/LE Abnormal movements: none Coordination:  There is  decremation with RAM's, only with toe taps on the L Gait and Station: The patient has no difficulty arising out of a deep-seated chair without the use of the hands. The patient's stride length is good with decreased  arm swing on the L.    I have reviewed and interpreted the following labs independently    Chemistry      Component Value Date/Time   NA 143 01/16/2019 0838   K 4.2 01/16/2019 0838   CL 104 01/16/2019 0838   CO2 25 01/16/2019 0838   BUN 15 01/16/2019 0838   CREATININE 1.24 01/16/2019 0838      Component Value Date/Time   CALCIUM 9.1 01/16/2019 0838   ALKPHOS 110 01/16/2019 0838   AST 21 01/16/2019 0838    ALT 15 01/16/2019 0838   BILITOT 0.3 01/16/2019 0838       Lab Results  Component Value Date   WBC 3.6 01/16/2019   HGB 13.4 01/16/2019   HCT 41.1 01/16/2019   MCV 97 01/16/2019   PLT 188 01/16/2019    Lab Results  Component Value Date   TSH 3.920 01/16/2019     Total time spent on today's visit was 20 minutes, including both face-to-face time and nonface-to-face time.  Time included that spent on review of records (prior notes available to me/labs/imaging if pertinent), discussing treatment and goals, answering patient's questions and coordinating care.  Cc:  Patient, No Pcp Per

## 2020-04-09 ENCOUNTER — Ambulatory Visit (INDEPENDENT_AMBULATORY_CARE_PROVIDER_SITE_OTHER): Payer: Medicare Other | Admitting: Neurology

## 2020-04-09 ENCOUNTER — Other Ambulatory Visit: Payer: Self-pay

## 2020-04-09 ENCOUNTER — Encounter: Payer: Self-pay | Admitting: Neurology

## 2020-04-09 VITALS — BP 108/60 | HR 63 | Ht 76.0 in | Wt 201.0 lb

## 2020-04-09 DIAGNOSIS — G20 Parkinson's disease: Secondary | ICD-10-CM

## 2020-04-09 NOTE — Patient Instructions (Signed)
No changes in your medication.  You look good!  Online Resources for Power over Parkinson's Group March 2022  . Local  Online Groups  o Power over [employer] Group :   - Power Over Parkinson's Patient Education Group will be Wednesday, March 9th at 2pm via Zoom.   - Upcoming Power over Parkinson's Meetings:  2nd Wednesdays of the month at 2 pm:       April 13th, May 11th - Contact Amy Marriott at [email or web address] if interested in participating in this online group o [place] Group:    3rd Mondays, Contact Corwin Levins o Atypical Parkinsonian Patient Group:   4th Wednesdays, Contact Corwin Levins o If you are interested in participating in these online groups with Judson Roch, please contact her directly for how to join those meetings.  Her contact information is [email or web address].  She will send you a link to join the [employer].  (Please note that Corwin Levins , MSW, LCSW, has resigned her position at [facility], but will continue to lead the online groups temporarily)  . [place]:  [email or web address] o PD Health at Home continues:  Mindfulness Mondays, Expert Briefing Tuesdays, Wellness Wednesdays, Take Time Thursdays, Fitness Fridays -Listings for March 2022 are on the website o Upcoming Webinar:  Conversations about Complementary Therapies and PD.  Wednesday, March 2nd @ 1 pm o Upcoming Webinar:  Can We Put the Brakes on PD Progression?  Wednesday, April 6th @ 1 pm o Geneticist, molecular) at [email or web address] o  Please check out their website to sign up for emails and see their full online offerings  . [place]:  [email or web address]  o Upcoming Webinar:   Trouble Sleeping?  What to Know about Acting out Dreams and other Sleep Issues.  Thursday, March 17th @ 12 noon o Check out additional information on their website to see their full online offerings  . [place]:  [email or web address] o Upcoming Webinar:  Women and Parkinson's.  Tuesday, March 8th at 2 pm o Care Partner Monthly Meetup.  With 12-11-1990 [place].  First Tuesday of each month, 2 pm o Check out additional information to Live Well Today on their website  . Parkinson and Movement Disorders (PMD) Alliance:  www.pmdalliance.org o [employer] Online:  Online Education Events o Sign up for emails, which are sent weekly to give you updates on programming and online offerings   . Parkinson's Association of the [place]:  www.parkinsonassociation.org o Information on online support groups, education events, and online exercises including Yoga, Parkinson's exercises and more-LOTS of information on links to PD resources and online events o Virtual Support Group through Parkinson's Association of the [place]; next one is scheduled for Wednesday, April 22, 2020 at 2 pm. (These are typically scheduled for the 1st Wednesday of the month at 2 pm).  Visit website for details.  . Additional links for movement activities: o PWR! Moves Classes at [place] RESUMED!  Wednesdays 10 and 11 am.  Contact Amy Marriott, PT [email or web address] or [phone] if interested o Here is a link to the PWR!Moves classes on Zoom from [phone] - Daily Mon-Sat at 10:00. Via Zoom, FREE and open to all.  There is also a link below via Facebook if you use that platform. - [place] - [email or web address] o Parkinson's Wellness Recovery (PWR! Moves)  www.[email or web address] - Info on the PWR! Virtual Experience:  You will have access to our expertise through self-assessment, guided plans that start with  the PD-specific fundamentals, educational content, tips, Q&A with an  expert, and a growing Art therapist of PD-specific pre-recorded and live exercise classes of varying types and intensity - both physical and cognitive! If that is not enough, we offer 1:1 wellness consultations (in-person or virtual) to personalize your PWR! Research scientist (medical).  - Check out the PWR! Move of the month on the [place] [facility] website:  [email or web address] o [employer] Fridays:  - As part of the PD Health @ Home program, this free video series focuses each week on one aspect of fitness designed to support people living with Parkinson's.  These weekly videos highlight the [place] recent fitness guidelines for people with Parkinson's disease. -  [email or web address].dk o Dance for PD website is offering free, live-stream classes throughout the week, as well as links to [employer] of classes:  [email or web address] o Dance for Parkinson's Class:  [place].  Free offering for people with Parkinson's and care partners; virtual class.  o For more information, contact [phone] or email Ruffin Frederick at [email or web address] o Virtual dance and Pilates for Parkinson's classes: Click on the Community Tab> Parkinson's Movement Initiative Tab.  To register for classes and for more information, visit www.[email or web address].co.[email or web address] and click the "community" tab.     o [employer] Parkinson's Cycling Classes  - [facility] [employer]: 1pm on Fridays-Live classes at [facility] [employer] at [email or web address] or [phone]) [facility] [employer]: Virtual Classes Mondays and Thursdays (contact [place] [facility] at [email or web address] or [phone])   o Enterprise levels of classes are offered Tuesdays and Thursdays:  10:30 am,  12 noon & 1:45 pm at [facility].  - Active  Stretching with Paula Compton Class starting in March, on Fridays - To observe a class or for  more information, call [phone] or email [email or web address] . Well[employer]Spring [employer]: o Chief Technology Officer Opportunities:  www.well-springsolutions.org/caregiver-education/caregiver-support-group.  You may also contact Vickki Muff at [email or web address] or [phone].   o Virtual Caregiver Retreat, Monday, February 21st, 3-5 pm o Powerful Tools for Caregivers, 6-wk series for caregivers, in-person, Thursdays March 10, 17, 24, 31 and April 7, 14, from 10:30-12:30 at 12-04-1969, [place].  Contact [address] (see above) to register o Well-Spring Navigator:  Just1Navigator program, a free service to help individuals and families through the journey of determining care for older adults.  The "Navigator" is a Vickki Muff, Education officer, museum, who will speak with a prospective client and/or loved ones to provide an assessment of the situation and a set of recommendations for a personalized care plan -- all free of charge, and whether Well-Spring [employer] offers the needed service or not. If the need is not a service we provide, we are well-connected with reputable programs in town that we can refer you to.  www.well-springsolutions.org or to speak with the Navigator, call [phone].

## 2020-05-08 ENCOUNTER — Other Ambulatory Visit: Payer: Self-pay

## 2020-05-08 DIAGNOSIS — F3175 Bipolar disorder, in partial remission, most recent episode depressed: Secondary | ICD-10-CM

## 2020-05-08 MED ORDER — CARBAMAZEPINE 200 MG PO TABS: 200.0000 mg | ORAL_TABLET | Freq: Two times a day (BID) | ORAL | 0 refills | Status: DC

## 2020-06-01 ENCOUNTER — Other Ambulatory Visit: Payer: Self-pay | Admitting: Psychiatry

## 2020-06-01 DIAGNOSIS — F3175 Bipolar disorder, in partial remission, most recent episode depressed: Secondary | ICD-10-CM

## 2020-06-08 ENCOUNTER — Ambulatory Visit: Payer: Medicare Other | Admitting: Psychiatry

## 2020-06-10 ENCOUNTER — Ambulatory Visit (INDEPENDENT_AMBULATORY_CARE_PROVIDER_SITE_OTHER): Payer: Medicare Other

## 2020-06-10 DIAGNOSIS — I440 Atrioventricular block, first degree: Secondary | ICD-10-CM

## 2020-06-11 LAB — CUP PACEART REMOTE DEVICE CHECK
Battery Remaining Longevity: 66 mo
Battery Remaining Percentage: 68 %
Brady Statistic RA Percent Paced: 83 %
Brady Statistic RV Percent Paced: 61 %
Date Time Interrogation Session: 20220525002200
Implantable Lead Implant Date: 20150603
Implantable Lead Implant Date: 20150603
Implantable Lead Location: 753859
Implantable Lead Location: 753860
Implantable Lead Model: 4136
Implantable Lead Model: 4137
Implantable Lead Serial Number: 29477746
Implantable Lead Serial Number: 29617326
Implantable Pulse Generator Implant Date: 20150603
Lead Channel Impedance Value: 584 Ohm
Lead Channel Impedance Value: 750 Ohm
Lead Channel Pacing Threshold Amplitude: 0.6 V
Lead Channel Pacing Threshold Amplitude: 1.4 V
Lead Channel Pacing Threshold Pulse Width: 0.4 ms
Lead Channel Pacing Threshold Pulse Width: 0.5 ms
Lead Channel Setting Pacing Amplitude: 1.9 V
Lead Channel Setting Pacing Amplitude: 2 V
Lead Channel Setting Pacing Pulse Width: 0.4 ms
Lead Channel Setting Sensing Sensitivity: 2.5 mV
Pulse Gen Serial Number: 389736

## 2020-06-17 ENCOUNTER — Other Ambulatory Visit: Payer: Self-pay | Admitting: Neurology

## 2020-06-23 ENCOUNTER — Other Ambulatory Visit: Payer: Self-pay

## 2020-06-23 DIAGNOSIS — F3175 Bipolar disorder, in partial remission, most recent episode depressed: Secondary | ICD-10-CM

## 2020-06-23 DIAGNOSIS — F4312 Post-traumatic stress disorder, chronic: Secondary | ICD-10-CM

## 2020-06-23 MED ORDER — FLUOXETINE HCL 10 MG PO CAPS: 10.0000 mg | ORAL_CAPSULE | Freq: Every day | ORAL | 0 refills | Status: DC

## 2020-07-02 NOTE — Progress Notes (Signed)
Remote pacemaker transmission.   

## 2020-07-13 ENCOUNTER — Other Ambulatory Visit: Payer: Self-pay

## 2020-07-13 ENCOUNTER — Ambulatory Visit: Payer: Medicare Other | Admitting: Psychiatry

## 2020-07-13 ENCOUNTER — Encounter: Payer: Self-pay | Admitting: Psychiatry

## 2020-07-13 ENCOUNTER — Ambulatory Visit (INDEPENDENT_AMBULATORY_CARE_PROVIDER_SITE_OTHER): Payer: Medicare Other | Admitting: Psychiatry

## 2020-07-13 VITALS — Wt 200.0 lb

## 2020-07-13 DIAGNOSIS — Z79899 Other long term (current) drug therapy: Secondary | ICD-10-CM

## 2020-07-13 DIAGNOSIS — F3175 Bipolar disorder, in partial remission, most recent episode depressed: Secondary | ICD-10-CM | POA: Diagnosis not present

## 2020-07-13 DIAGNOSIS — F4312 Post-traumatic stress disorder, chronic: Secondary | ICD-10-CM | POA: Diagnosis not present

## 2020-07-13 MED ORDER — FLUOXETINE HCL 10 MG PO CAPS: 10.0000 mg | ORAL_CAPSULE | Freq: Every day | ORAL | 1 refills | Status: DC

## 2020-07-13 MED ORDER — QUETIAPINE FUMARATE ER 50 MG PO TB24: 50.0000 mg | ORAL_TABLET | Freq: Every day | ORAL | 1 refills | Status: DC

## 2020-07-13 MED ORDER — CARBAMAZEPINE 200 MG PO TABS: 200.0000 mg | ORAL_TABLET | Freq: Two times a day (BID) | ORAL | 1 refills | Status: DC
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
## 2020-08-15 LAB — COMPREHENSIVE METABOLIC PANEL
AG Ratio: 1.8 (calc) (ref 1.0–2.5)
ALT: 11 U/L (ref 9–46)
AST: 15 U/L (ref 10–35)
Albumin: 3.9 g/dL (ref 3.6–5.1)
Alkaline phosphatase (APISO): 59 U/L (ref 35–144)
BUN/Creatinine Ratio: 22 (calc) (ref 6–22)
BUN: 26 mg/dL — ABNORMAL HIGH (ref 7–25)
CO2: 26 mmol/L (ref 20–32)
Calcium: 9.1 mg/dL (ref 8.6–10.3)
Chloride: 107 mmol/L (ref 98–110)
Creat: 1.18 mg/dL (ref 0.70–1.22)
Globulin: 2.2 g/dL (calc) (ref 1.9–3.7)
Glucose, Bld: 91 mg/dL (ref 65–139)
Potassium: 4.5 mmol/L (ref 3.5–5.3)
Sodium: 142 mmol/L (ref 135–146)
Total Bilirubin: 0.5 mg/dL (ref 0.2–1.2)
Total Protein: 6.1 g/dL (ref 6.1–8.1)

## 2020-08-15 LAB — CBC WITH DIFFERENTIAL/PLATELET
Absolute Monocytes: 562 cells/uL (ref 200–950)
Basophils Absolute: 62 cells/uL (ref 0–200)
Basophils Relative: 1.3 %
Eosinophils Absolute: 341 cells/uL (ref 15–500)
Eosinophils Relative: 7.1 %
HCT: 42.7 % (ref 38.5–50.0)
Hemoglobin: 13.9 g/dL (ref 13.2–17.1)
Lymphs Abs: 782 cells/uL — ABNORMAL LOW (ref 850–3900)
MCH: 32.1 pg (ref 27.0–33.0)
MCHC: 32.6 g/dL (ref 32.0–36.0)
MCV: 98.6 fL (ref 80.0–100.0)
MPV: 10 fL (ref 7.5–12.5)
Monocytes Relative: 11.7 %
Neutro Abs: 3053 cells/uL (ref 1500–7800)
Neutrophils Relative %: 63.6 %
Platelets: 171 10*3/uL (ref 140–400)
RBC: 4.33 10*6/uL (ref 4.20–5.80)
RDW: 13.1 % (ref 11.0–15.0)
Total Lymphocyte: 16.3 %
WBC: 4.8 10*3/uL (ref 3.8–10.8)

## 2020-08-15 LAB — CARBAMAZEPINE LEVEL, TOTAL: Carbamazepine Lvl: 4.8 mg/L (ref 4.0–12.0)

## 2020-09-05 ENCOUNTER — Other Ambulatory Visit: Payer: Self-pay | Admitting: Psychiatry

## 2020-09-05 DIAGNOSIS — F3175 Bipolar disorder, in partial remission, most recent episode depressed: Secondary | ICD-10-CM

## 2020-09-05 DIAGNOSIS — F4312 Post-traumatic stress disorder, chronic: Secondary | ICD-10-CM

## 2020-09-09 ENCOUNTER — Ambulatory Visit: Payer: Medicare Other

## 2020-09-10 ENCOUNTER — Telehealth: Payer: Self-pay

## 2020-09-10 NOTE — Telephone Encounter (Signed)
The patient is going to receive a new cell adapter in 7-10 business days.

## 2020-09-15 ENCOUNTER — Ambulatory Visit (INDEPENDENT_AMBULATORY_CARE_PROVIDER_SITE_OTHER): Payer: Medicare Other

## 2020-09-15 ENCOUNTER — Telehealth: Payer: Self-pay

## 2020-09-15 DIAGNOSIS — I440 Atrioventricular block, first degree: Secondary | ICD-10-CM

## 2020-09-15 LAB — CUP PACEART REMOTE DEVICE CHECK
Battery Remaining Longevity: 60 mo
Battery Remaining Percentage: 63 %
Brady Statistic RA Percent Paced: 83 %
Brady Statistic RV Percent Paced: 63 %
Date Time Interrogation Session: 20220824002100
Implantable Lead Implant Date: 20150603
Implantable Lead Implant Date: 20150603
Implantable Lead Location: 753859
Implantable Lead Location: 753860
Implantable Lead Model: 4136
Implantable Lead Model: 4137
Implantable Lead Serial Number: 29477746
Implantable Lead Serial Number: 29617326
Implantable Pulse Generator Implant Date: 20150603
Lead Channel Impedance Value: 586 Ohm
Lead Channel Impedance Value: 801 Ohm
Lead Channel Pacing Threshold Amplitude: 0.6 V
Lead Channel Pacing Threshold Amplitude: 1.4 V
Lead Channel Pacing Threshold Pulse Width: 0.4 ms
Lead Channel Pacing Threshold Pulse Width: 0.5 ms
Lead Channel Setting Pacing Amplitude: 1.9 V
Lead Channel Setting Pacing Amplitude: 2 V
Lead Channel Setting Pacing Pulse Width: 0.4 ms
Lead Channel Setting Sensing Sensitivity: 2.5 mV
Pulse Gen Serial Number: 389736

## 2020-09-15 NOTE — Telephone Encounter (Signed)
The patient received his new cell adapter. I helped him replace it and now his monitor is working. I scheduled his remote and put him on a new schedule.

## 2020-09-24 ENCOUNTER — Telehealth: Payer: Self-pay | Admitting: Internal Medicine

## 2020-09-24 IMAGING — DX DG CHEST 2V
2 series · 2 of 2 positions shown · non-contrast
Comparison: Chest x-ray dated 06/20/2013.

CLINICAL DATA: COPD follow-up. History of hypertension.

EXAM:
CHEST - 2 VIEW

[chest pa]
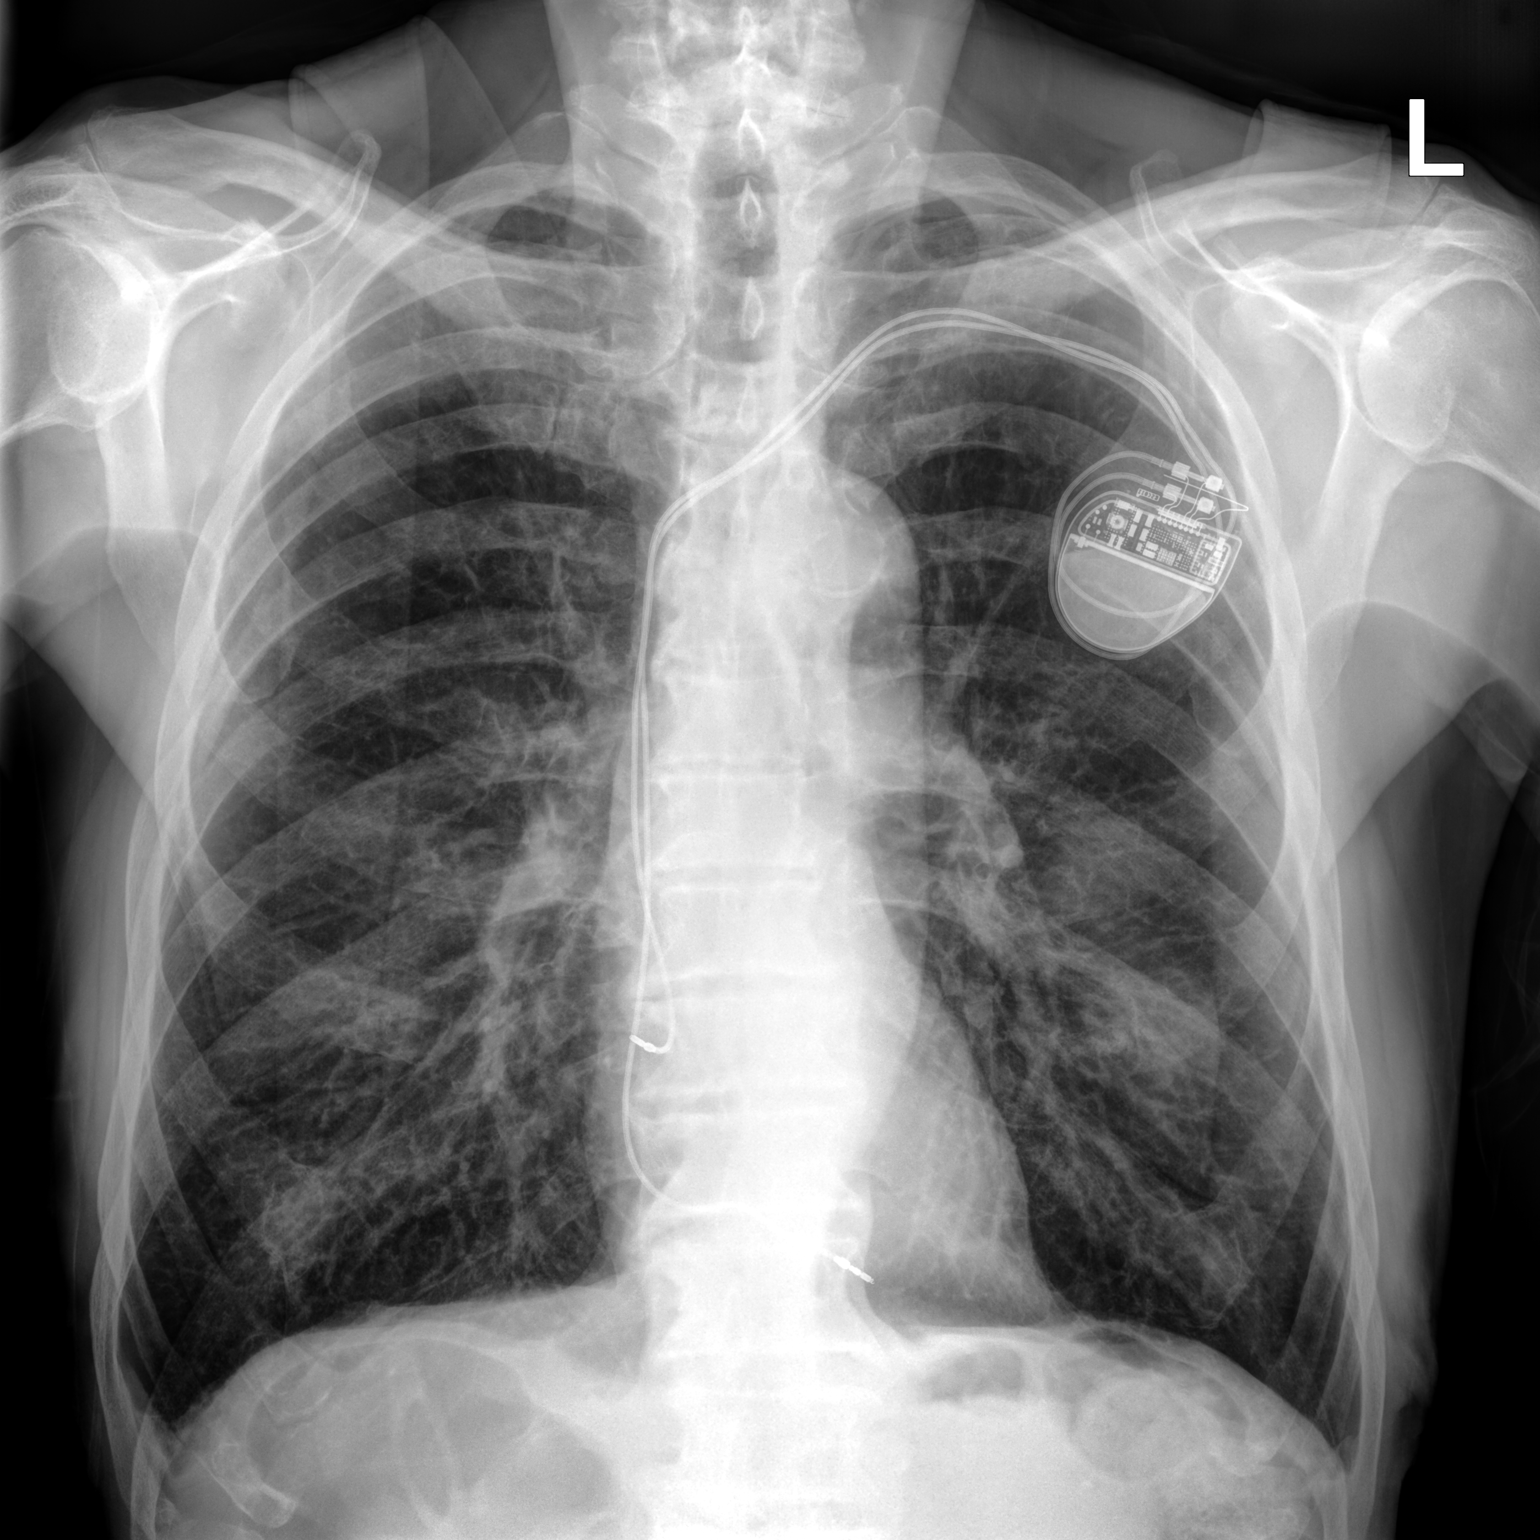

[chest lat]
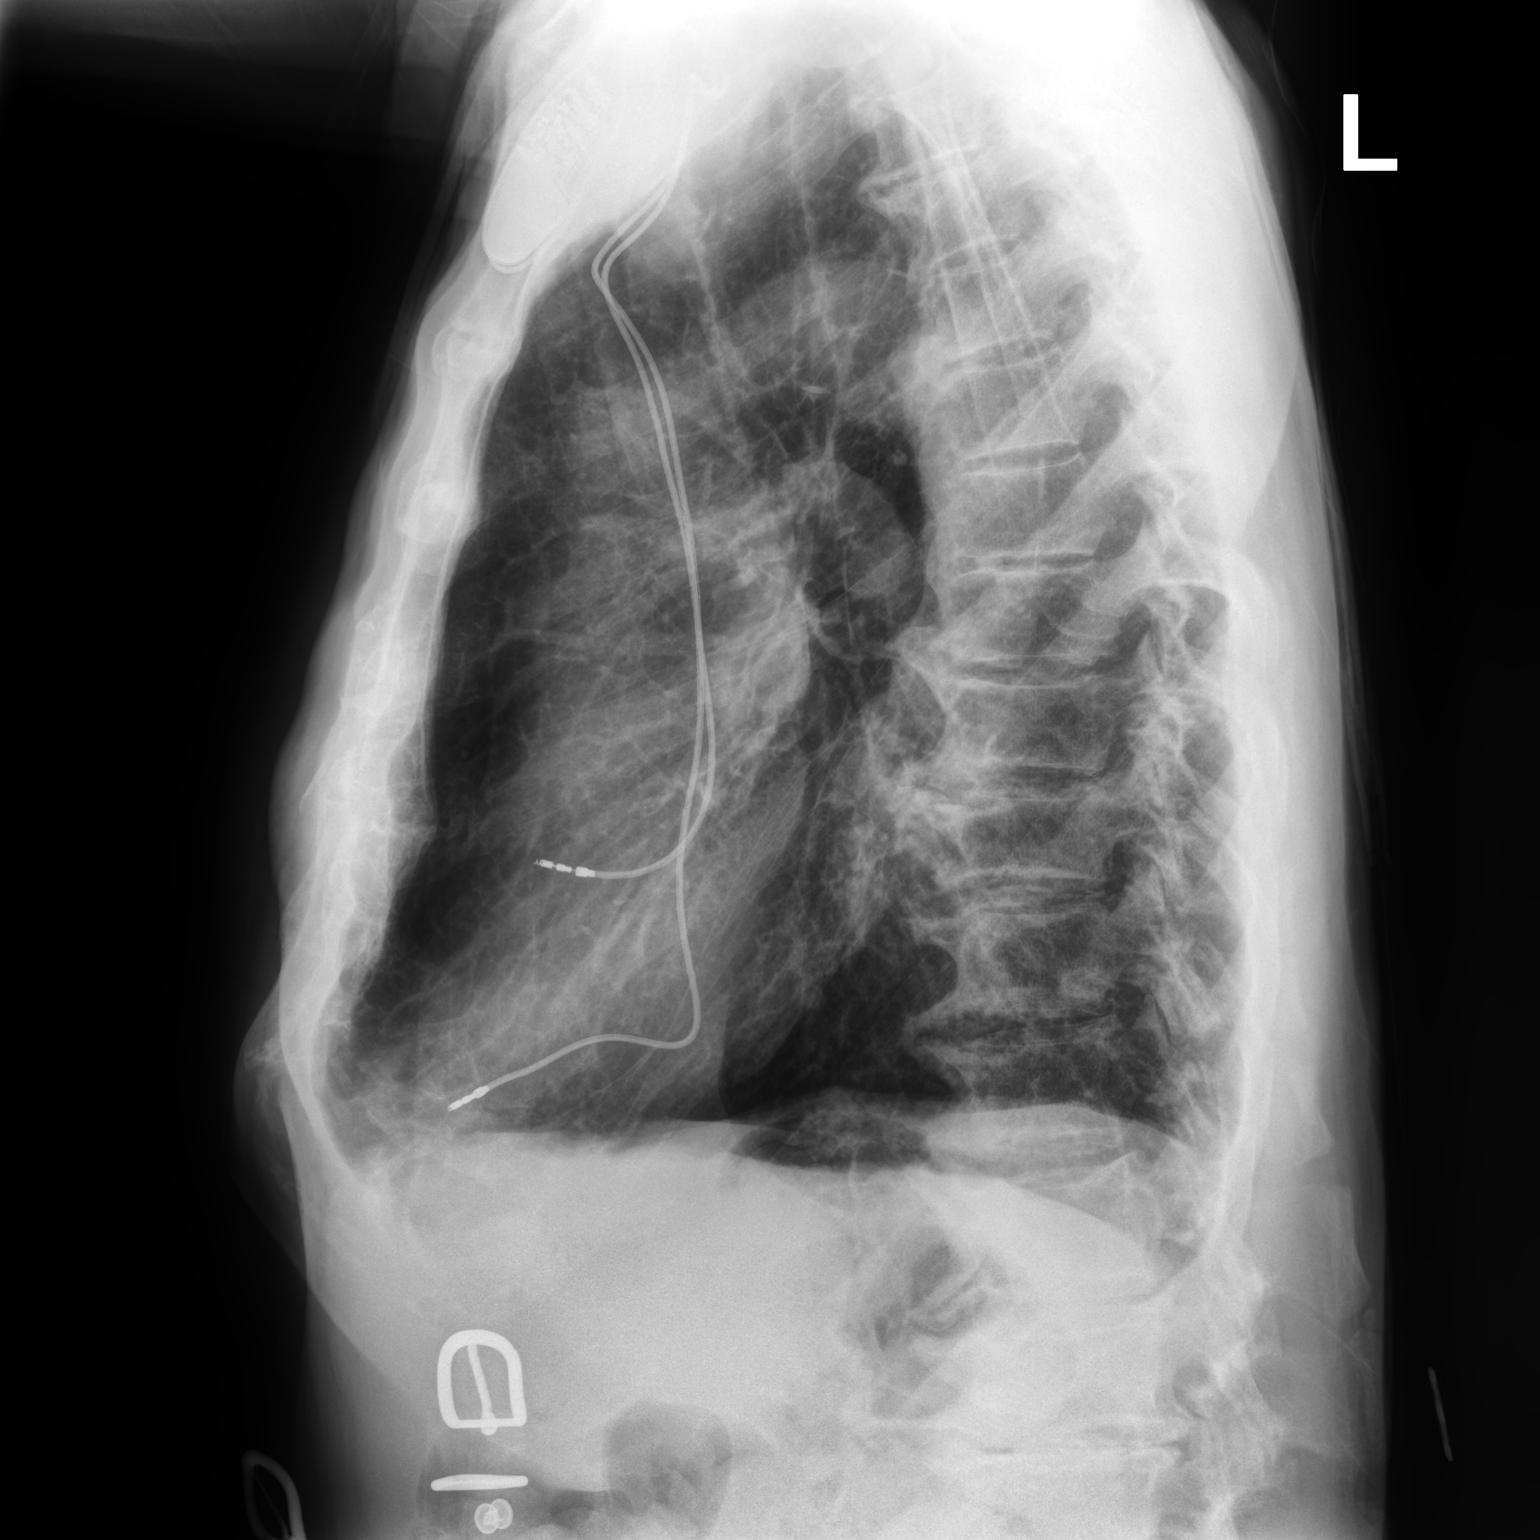

[2 of 2 positions shown; findings below may reference images not displayed]

FINDINGS: Heart size and mediastinal contours are within normal limits. LEFT
chest wall pacemaker/ICD apparatus appears stable in position. Lungs
are hyperexpanded. Lungs are clear. No pleural effusion or
pneumothorax is seen. Degenerative spondylosis of the thoracolumbar
spine, mild to moderate in degree.
IMPRESSION: 1. No active cardiopulmonary disease. No evidence of pneumonia or
pulmonary edema.
2. Hyperexpanded lungs indicating COPD.

## 2020-09-24 NOTE — Telephone Encounter (Signed)
Dr. Annamaria Boots had an opening on Monday at 10am. I have scheduled the patient to be seen then and made Dr. Annamaria Boots aware. Nothing further needed at this time.

## 2020-09-24 NOTE — Telephone Encounter (Signed)
Ok as discussed

## 2020-09-24 NOTE — Telephone Encounter (Signed)
Called and spoke with patient who states that he has a mild cough and rumble in his chest but he can't get anything up. Had Covid about 6 months ago. Denies any fevers. Patient has not been seen since 2020 but would like to be seen.   Dr. Annamaria Boots are you ok with using RNA slot for him? If not any recommendations?

## 2020-09-25 NOTE — Progress Notes (Signed)
HPI M former smoker, followed for COPD, rhinitis, OSA, complicated by BiPolar, HBP, hx melanoma, DM, pacemaker, Parkinsons NPSG 02/29/96- AHI 79/ hr, desaturation to 87%, body weight 250 lbs 6 MWT- 99%, 100%, 99%, 510 meters PFT 07/07/2011-mild obstructive airways disease, small airways, insignificant response to bronchodilator. Normal lung volumes, normal diffusion capacity. FEV1/FVC 0.61  --------------------------------------------------------------------------------------------------------    12/18/2018- 87 year old male former smoker followed for COPD, rhinitis, OSA, complicated by Bipolar, HBP, history melanoma, DM 2, pacemaker, + Parkinson's, Hypothyroid, CPAP auto 5-15/Advanced Had moved away. When wife died this summer he moved back to [place]. Seroquel at night managed by psychiatry. Parkinson's managed by Neurology.  -----f/u OSA  last seen 03/24/2016 Download- no use recorded after Oct 25, AHI 8.9/ hr . He put vinegar into reservoir by mistake and was afraid to use machine after that.  Breathing ok- not using any inhalers and denies significant dyspnea or cough.  UTD flu and pneumonia vax.     09/28/20- 87 year old male former smoker (40 pk yrs) followed for COPD, rhinitis, OSA/Quit CPAP, complicated by Bipolar, HBP, history melanoma, DM 2, pacemaker, + Parkinson's, Hypothyroid, Memory Loss, Covid infection Feb 2022,  CPAP auto 5-15/Advanced- no longer using x 2 years Download- N/A Body weight today-204 lbs Covid vax- -----F/u cough-better, no sob Had called reporting chest congestion and cough last week. Had Covid 6 months ago.  Recent illness improved- there was no significant malaise, fever or sputum.  Seeking a PCP in [facility] and lives in [place]. Stopped bothering with CPAP when helping with moves related to wife's illness. Doesn't miss it and doesn't feel he needs it.  CXR 12/18/18- IMPRESSION: 1. No active cardiopulmonary disease. No evidence of pneumonia or pulmonary  edema. 2. Hyperexpanded lungs indicating COPD.  ROS-see HPI    + = positive Constitutional:   No-   weight loss, night sweats, fevers, chills, fatigue, lassitude. HEENT:   No-  headaches, difficulty swallowing, tooth/dental problems, sore throat,       No-  sneezing, itching, ear ache, nasal congestion, post nasal drip,  CV:  No-   chest pain, orthopnea, PND, swelling in lower extremities, anasarca, dizziness, palpitations Resp: +  shortness of breath with exertion or at rest.              No-productive cough,  No non-productive cough,  No- coughing up of blood.              No-   change in color of mucus.  No- wheezing.   Skin: No-   rash or lesions. GI:  No-   heartburn, indigestion, abdominal pain, nausea, vomiting,  GU:  MS:  + joint pain or swelling. Weakness and falls. Neuro-     tremor Psych:  No- change in mood or affect. No depression or anxiety.  No memory loss.  OBJ   exam similar to last visit General- Alert, Oriented, Affect-appropriate, Distress- none acute, looks well, + slender Skin- rash-none, lesions- none, excoriation- none Lymphadenopathy- none Head- atraumatic            Eyes- Gross vision intact, PERRLA, conjunctivae clear secretions,             Ears- Hearing, canals-normal            Nose- Clear, no-Septal dev, mucus, polyps, erosion, perforation             Throat- Mallampati II , mucosa clear , drainage- none, tonsils- atrophic Neck- flexible , trachea midline, no stridor , thyroid nl,  carotid no bruit Chest - symmetrical excursion , unlabored           Heart/CV-  Pulse is RRR occasional skip , no murmur , no gallop  , no rub, nl s1 s2                           - JVD- none , edema- none, stasis changes- none, varices- none           Lung- clear, unlabored, wheeze- none, cough- none , dullness-none,  rub- none           Chest wall- + left pacemaker Abd-  Br/ Gen/ Rectal- Not done, not indicated Extrem- cyanosis- none, clubbing, none, atrophy- none,  strength- nl, using a cane.  Neuro- +resting tremor of hands

## 2020-09-28 ENCOUNTER — Ambulatory Visit (INDEPENDENT_AMBULATORY_CARE_PROVIDER_SITE_OTHER): Payer: Medicare Other

## 2020-09-28 ENCOUNTER — Encounter: Payer: Self-pay | Admitting: Internal Medicine

## 2020-09-28 ENCOUNTER — Encounter: Payer: Self-pay | Admitting: *Deleted

## 2020-09-28 ENCOUNTER — Ambulatory Visit (INDEPENDENT_AMBULATORY_CARE_PROVIDER_SITE_OTHER): Payer: Medicare Other | Admitting: Internal Medicine

## 2020-09-28 ENCOUNTER — Other Ambulatory Visit: Payer: Self-pay

## 2020-09-28 VITALS — BP 138/62 | HR 62 | Temp 97.6°F | Ht 76.0 in | Wt 204.0 lb

## 2020-09-28 DIAGNOSIS — Z23 Encounter for immunization: Secondary | ICD-10-CM | POA: Diagnosis not present

## 2020-09-28 DIAGNOSIS — J449 Chronic obstructive pulmonary disease, unspecified: Secondary | ICD-10-CM | POA: Diagnosis not present

## 2020-09-28 DIAGNOSIS — G4733 Obstructive sleep apnea (adult) (pediatric): Secondary | ICD-10-CM

## 2020-09-28 NOTE — Assessment & Plan Note (Signed)
Resolved Covid last winter.Recent self-limited acute bronchitis. Plan- CXR. Wait on inhaler. Senior flu vax.

## 2020-09-28 NOTE — Assessment & Plan Note (Signed)
Ok to stay off CPAP at this point in his life. He is comfortable without.

## 2020-09-28 NOTE — Progress Notes (Signed)
Remote pacemaker transmission.   

## 2020-09-28 NOTE — Patient Instructions (Signed)
Ok to stay off CPAP  Order- CXR   dx COPD mixed type  Order- Flu vax senior  Suggest you go to the medical office you mentioned that is convenient to your home and ask if they can establish you with a primary care doctor there. Otherwise, feel free to contact uss and we will try to help find someone for you.

## 2020-10-12 NOTE — Progress Notes (Signed)
Assessment/Plan:   1.  Parkinsons Disease  -Continue carbidopa/levodopa 25/100, 2 tablets 3 times per day  -discussed PT.  I will send to [facility].    2.  History of well-controlled bipolar  -Follows with psychiatry.  On Tegretol and Seroquel XR, 50 mg at bedtime  3.  History of melanoma  -Follows with Dr. Delman Cheadle  -Knows that Parkinson's slightly increases risk for future melanomas as well.   Subjective:   Victor Osborne was seen today in follow up for Parkinsons disease.  My previous records were reviewed prior to todays visit as well as outside records available to me.  Cardiology notes and nurse practitioner psych notes are reviewed.  Feels that isn't doing as well b/c of "balance issues."  Pt denies falls.  Admits to stress.  Live in girlfriend moved out and he states that he is doing okay with it but he can't have her in and out - "she wants to leave and come back."  Current prescribed movement disorder medications: Carbidopa/levodopa 25/100, 2 tablets 3 times per day   ALLERGIES:  No Known Allergies  CURRENT MEDICATIONS:  Outpatient Encounter Medications as of 10/14/2020  Medication Sig   acetaminophen (TYLENOL) 325 MG tablet Take 650 mg by mouth every 6 (six) hours as needed.   alclomethasone (ACLOVATE) 0.05 % ointment Apply 1 application topically 2 (two) times daily as needed (for rash).   Bacillus Coagulans-Inulin (PROBIOTIC-PREBIOTIC) 1-250 BILLION-MG CAPS Take by mouth.   Bee Pollen 550 MG CAPS Take by mouth.   carbamazepine (TEGRETOL) 200 MG tablet Take 1 tablet (200 mg total) by mouth 2 (two) times daily.   carbidopa-levodopa (SINEMET IR) 25-100 MG tablet TAKE 2 TABLETS BY MOUTH 3 (THREE) TIMES DAILY.   cholecalciferol (VITAMIN D) 1000 UNITS tablet Take 2,000 Units by mouth daily.   Coenzyme Q10 (CO Q 10 PO) Take by mouth.   desoximetasone (TOPICORT) 0.25 % cream Apply 1 application topically 2 (two) times daily.   ferrous sulfate 324 MG TBEC Take 324 mg by  mouth.   Flaxseed, Linseed, (FLAX SEED OIL) 1300 MG CAPS Take 1 capsule by mouth daily.   FLUoxetine (PROZAC) 10 MG capsule TAKE 1 CAPSULE EVERY DAY   levothyroxine (LEVOTHROID) 125 MCG tablet Take 1 tablet (125 mcg total) by mouth daily before breakfast.   MAGNESIUM PO Take by mouth.   Multiple Vitamin (MULITIVITAMIN WITH MINERALS) TABS Take 1 tablet by mouth daily.   Omega-3 Fatty Acids (OMEGA-3 CF PO) Take 1,200 mg by mouth daily.   POTASSIUM GLUCONATE PO Take by mouth.   pravastatin (PRAVACHOL) 40 MG tablet Take 1 tablet (40 mg total) by mouth daily.   QUEtiapine (SEROQUEL XR) 50 MG TB24 24 hr tablet Take 1 tablet (50 mg total) by mouth at bedtime.   TURMERIC CURCUMIN PO Take by mouth.   No facility-administered encounter medications on file as of 10/14/2020.    Objective:   PHYSICAL EXAMINATION:    VITALS:   Vitals:   10/14/20 1023  BP: 136/62  Pulse: 65  Resp: 18  SpO2: 95%  Weight: 206 lb (93.4 kg)  Height: 6\' 4"  (1.93 m)     GEN:  The patient appears stated age and is in NAD. HEENT:  Normocephalic, atraumatic.  The mucous membranes are moist. The superficial temporal arteries are without ropiness or tenderness. CV:  RRR Lungs:  CTAB Neck/HEME:  There are no carotid bruits bilaterally.  Neurological examination:  Orientation: The patient is alert and oriented x3. Cranial  nerves: There is good facial symmetry with minimal facial hypomimia. The speech is fluent and clear. Soft palate rises symmetrically and there is no tongue deviation. Hearing is intact to conversational tone. Sensation: Sensation is intact to light touch throughout Motor: Strength is at least antigravity x4.  Movement examination: Tone: There is nl tone in the UE/LE Abnormal movements: none Coordination:  There is  decremation with RAM's, only with toe taps on the L (same as last visit) Gait and Station: The patient has no difficulty arising out of a deep-seated chair without the use of the  hands. The patient's stride length is good with decreased arm swing on the L.  He festinates slightly.    I have reviewed and interpreted the following labs independently    Chemistry      Component Value Date/Time   NA 142 08/14/2020 1112   NA 143 01/16/2019 0838   K 4.5 08/14/2020 1112   CL 107 08/14/2020 1112   CO2 26 08/14/2020 1112   BUN 26 (H) 08/14/2020 1112   BUN 15 01/16/2019 0838   CREATININE 1.18 08/14/2020 1112      Component Value Date/Time   CALCIUM 9.1 08/14/2020 1112   ALKPHOS 110 01/16/2019 0838   AST 15 08/14/2020 1112   ALT 11 08/14/2020 1112   BILITOT 0.5 08/14/2020 1112   BILITOT 0.3 01/16/2019 0838       Lab Results  Component Value Date   WBC 4.8 08/14/2020   HGB 13.9 08/14/2020   HCT 42.7 08/14/2020   MCV 98.6 08/14/2020   PLT 171 08/14/2020    Lab Results  Component Value Date   TSH 3.920 01/16/2019    Cc:  Patient, No Pcp Per (Inactive)

## 2020-10-14 ENCOUNTER — Encounter: Payer: Self-pay | Admitting: Neurology

## 2020-10-14 ENCOUNTER — Ambulatory Visit (INDEPENDENT_AMBULATORY_CARE_PROVIDER_SITE_OTHER): Payer: Medicare Other | Admitting: Neurology

## 2020-10-14 ENCOUNTER — Other Ambulatory Visit: Payer: Self-pay

## 2020-10-14 VITALS — BP 136/62 | HR 65 | Resp 18 | Ht 76.0 in | Wt 206.0 lb

## 2020-10-14 DIAGNOSIS — G20 Parkinson's disease: Secondary | ICD-10-CM | POA: Diagnosis not present

## 2020-10-14 MED ORDER — CARBIDOPA-LEVODOPA 25-100 MG PO TABS: 2.0000 | ORAL_TABLET | Freq: Three times a day (TID) | ORAL | 1 refills | Status: DC

## 2020-10-14 NOTE — Patient Instructions (Addendum)
We have sent a referral to [facility] for therapy.  If you don't hear from them to schedule an appointment, feel free to contact them.  Their contact information is as follows:  [facility] Address: [address], [place], [place] [address] Phone: [phone]   Online Resources for Power over Parkinson's Group September 2022  [place] over Parkinson's Group :   Power Over Parkinson's Patient Education Group will be Wednesday, September 14th-*Hybrid meting*- in person at [facility] location and via [facility] at 2:00 pm.   Upcoming Power over [employer] Meetings:  2nd Wednesdays of the month at 2 pm:  August 10th, September 14th [place] at [email or web address] if interested in participating in this online group Parkinson's Care Partners Group:    3rd Mondays, Contact Misty Paladino Atypical Parkinsonian Patient Group:   4th Wednesdays, [place] If you are interested in participating in these online groups with Misty, please contact her directly for how to join those meetings.  Her contact information is [email or web address].   [place]:  [email or web address] PD Health at Home continues:  Mindfulness Mondays, Expert Briefing Tuesdays, Wellness Wednesdays, Take Time Thursdays, Fitness Fridays -Listings for June 2022 are on the website Upcoming Webinar:  Use it or Lose It-The Impact of Physical Activity in Parkinson's.  Wednesday, September 7th at 1 pm. Upcoming Webinar:  Understanding Gene and Cell-Based Therapies in Parkinson's.  Wednesday, October 5th at 1 pm Register for expert briefings (webinars) at [email or web address]  Please check out their website to sign up for emails and see their full online offerings  [place]:  [email or web address]  Upcoming Webinar:   Finding your Way:   Working through Emotions in the Early Years with Parkinson's.  Thursday, September 15th at 12 noon Check out additional information on their website to see their full online offerings  [place]:  [email or web address] Upcoming Webinar:  Pelvic Health and Living with Parkinson's.  Tuesday, September 20th at 3 pm.  Care Partner Monthly Meetup.  With Robin Searing Phinney.  First Tuesday of each month, 2 pm Joy Breaks:  First Wednesday of each month, 2-3 pm. There will be art, doodling, making, crafting, listening, laughing, stories, and everything in between. No art experience necessary. No supplies required. Just show up for joy!  Register on their website. Check out additional information to Live Well Today on their website  Parkinson and Movement Disorders (PMD) Alliance:  www.[email or web address] [employer]Life Online:  Online Education Events Sign up for emails, which are sent weekly to give you updates on programming and online offerings  Parkinson's Association of the [place]:  [email or web address] Caring for Parkinson's, Caring for You Symposium, Saturday, September 10th.  [place], [place].  Symposium Registration - Parkinson Association of the [place] Information on online support groups, education events, and online exercises including Yoga, Parkinson's exercises and more-LOTS of information on links to PD resources and online events Virtual Support Group through Parkinson's Association of the [place]; next one is scheduled for Wednesday, October 5th at 2 pm. No September support group due to symposium.  (These are typically scheduled for the 1st Wednesday of the month at 2 pm).  Visit website for details.  Additional links for movement activities: Parkinson's DRUMMING Classes/Music Therapy with Doylene Canning:  This is a returning class!  2nd Mondays, beginning September 12th.  Contact Doylene Canning at [phone] or [email or web address] PWR! Moves  Classes at [place] RESUMED!  Wednesdays 10  and 11 am.  Contact Amy Marriott, PT [email or web address] or [phone] if interested Here is a link to the PWR!Moves classes on Zoom from [place] - Daily Mon-Sat at 10:00. Via Zoom, FREE and open to all.  There is also a link below via Facebook if you use that platform. [email or web address] Parkinson's Wellness Recovery (PWR! Moves)  [email or web address] Info on the PWR! Virtual Experience:  You will have access to our expertise through self-assessment, guided plans that start with the PD-specific fundamentals, educational content, tips, Q&A with an expert, and a growing Art therapist of PD-specific pre-recorded and live exercise classes of varying types and intensity - both physical and cognitive! If that is not enough, we offer 1:1 wellness consultations (in-person or virtual) to personalize your PWR! Research scientist (medical).  [place] Fridays:  As part of the PD Health @ Home program, this free video series focuses each week on one aspect of fitness designed to support people living with Parkinson's.  These weekly videos highlight the [place] recent fitness guidelines for people with Parkinson's disease.  [email or web address] Dance for PD website is offering free, live-stream classes throughout the week, as well as links to [employer] of classes:  [email or web address] Dance for Parkinson's Class:  [place].  Free offering for people with Parkinson's and care partners; virtual class.  For more information, contact [phone] or email Ruffin Frederick at [email or web address] Virtual  dance and Pilates for Parkinson's classes: Click on the Community Tab> Parkinson's Movement Initiative Tab.  To register for classes and for more information, visit [email or web address] and click the "community" tab.  [employer] Parkinson's Cycling Classes  [employer]: 1pm on Fridays-Live classes at [employer] ([employer] at [email or web address] or [phone]) [employer]: Virtual Classes Mondays and Thursdays Jeanette Caprice classes Tuesday, Wednesday and Thursday (contact [place] at [email or web address]  or [phone])  [facility] Boxing Three levels of classes are offered Tuesdays and Thursdays:  10:30 am,  12 noon & 1:45 pm at [facility].  Active Stretching with Paula Compton Class starting in March, on Fridays To observe a class or for  more information, call [phone] or email [email or web address] Well[employer]Spring [employer]: Online Caregiver Education Opportunities:  [email or web address]/[email or web address]-education/[email or web address]-group.  You may also contact Vickki Muff at [email or web address] or [phone].   Powerful Tools for Caregivers:  6-week program beginning Thursday, October 13th.  This six-week educational series designed to provide family caregivers with practical tools to care for themselves while caring for a loved one Unlocking Dementia through [facility], Humor, and Understanding:  5-week series beginning September 7th Well-Spring Navigator:  03-24-1997 program, a free service to help individuals and families through the journey of determining care for older adults.  The "Navigator" is a [employer], Education officer, museum, who will speak with a prospective client and/or loved ones to provide an assessment of the situation and a set of recommendations for a personalized care plan -- all free of charge, and whether [employer] offers the needed service or not. If the need is not a service we provide, we are  well-connected with reputable programs in town that we can refer you to.  www.well-[email or web address] or to speak with the Navigator, call [phone].

## 2020-10-20 ENCOUNTER — Other Ambulatory Visit: Payer: Self-pay

## 2020-10-20 DIAGNOSIS — G20 Parkinson's disease: Secondary | ICD-10-CM

## 2020-10-26 ENCOUNTER — Other Ambulatory Visit: Payer: Self-pay

## 2020-10-26 ENCOUNTER — Ambulatory Visit: Payer: Medicare Other | Attending: Neurology | Admitting: Physical Therapy

## 2020-10-26 ENCOUNTER — Encounter: Payer: Self-pay | Admitting: Physical Therapy

## 2020-10-26 DIAGNOSIS — R293 Abnormal posture: Secondary | ICD-10-CM | POA: Diagnosis present

## 2020-10-26 DIAGNOSIS — R2681 Unsteadiness on feet: Secondary | ICD-10-CM | POA: Diagnosis present

## 2020-10-26 DIAGNOSIS — R2689 Other abnormalities of gait and mobility: Secondary | ICD-10-CM | POA: Diagnosis not present

## 2020-10-26 NOTE — Therapy (Addendum)
[facility] [address]  [place], [place], [address] Phone: [phone]   Fax:  [phone]  Physical Therapy Evaluation  Patient Details  Name: Victor Osborne MRN: [id number] Date of Birth: 1933-01-17 Referring Provider (PT): Alonza Bogus, DO   Encounter Date: 10/26/2020   PT End of Session - 10/26/20 1355     Visit Number 1    Number of Visits 12    Date for PT Re-Evaluation 12/07/20    Authorization Type Medicare & [employer]    PT Start Time 1355    PT Stop Time 1453    PT Time Calculation (min) 58 min    Activity Tolerance Patient tolerated treatment well    Behavior During Therapy [facility] for tasks assessed/performed             Past Medical History:  Diagnosis Date   Bipolar 1 disorder ([place])    COPD (chronic obstructive pulmonary disease) ([place])    Depression    First degree AV block    Hypertension    pt denies but was in medical record   Hypoglycemia, unspecified    Myalgia and myositis, unspecified    Obesity    Pneumonia 1970   Skin cancer (melanoma) ([place])    Thyroid disease    Unspecified hypothyroidism    UPJ obstruction, congenital     Past Surgical History:  Procedure Laterality Date   APPENDECTOMY  1939   malignant melanoma removed from right forehead  2008   PERMANENT PACEMAKER INSERTION N/A 06/19/2013   Procedure: PERMANENT PACEMAKER INSERTION;  Surgeon: Evans Lance, MD;  Location[facility] CATH LAB;  Service: Cardiovascular;  Laterality: N/A;   right big toe  surgery  2009   TRANSURETHRAL RESECTION OF PROSTATE  02/04/2011   Procedure: TRANSURETHRAL RESECTION OF THE PROSTATE (TURP);  Surgeon: Dutch Gray, MD;  Location: [facility] ORS;  Service: Urology;  Laterality: N/A;   URETHRA SURGERY  2010   reconstruction    There were no vitals filed for this visit.    Subjective Assessment - 10/26/20 1401     Subjective Pt reports he was first diagnosed with Parkinson's disease ~15  yrs ago. Had PT at time of original diagnosis but none recently. Pt reports recent testing with MD unchanged but he feels like he is going downhill - notes worsening of tremors and unsteady balance but no recent falls. Uses cane intermittently when tired or stressed. Notes strength wanes more quickly since he had COVID-19 in ~March 2022.    Pertinent History bipolar    Patient Stated Goals "better balance and coordination"    Currently in Pain? No/denies                [facility] PT Assessment - 10/26/20 1355       Assessment   Medical Diagnosis Parkinson's disease    Referring Provider (PT) Alonza Bogus, DO    Onset Date/Surgical Date 10/14/20   MD visit; PD diagnosis ~15 yrs ago   Hand Dominance Right    Next MD Visit 04/13/21    Prior Therapy PT shortly after PD diagnosis and again in 2018 but none recently      Precautions   Precautions Fall      Restrictions   Weight Bearing Restrictions No      Balance Screen   Has the patient fallen in the past 6 months No    Has the patient had a decrease in activity level  because of a fear of falling?  No    Is the patient reluctant to leave their home because of a fear of falling?  No      Home Environment   Living Environment Private residence    Living Arrangements Spouse/significant other    Type of [place] living facility   3rd floor apartment   [place] - single point   hiking pole     Prior Function   Level of [place]   goes out for most meals   Leisure being outdoors - hiking/walking daily x 30 min      Cognition   Overall Cognitive Status Within Functional Limits for tasks assessed      Coordination   Gross Motor Movements are Fluid and Coordinated Yes    Fine Motor Movements are Fluid and Coordinated Yes      Posture/Postural Control   Posture/Postural Control Postural limitations    Postural Limitations Forward  head;Rounded Shoulders      ROM / Strength   AROM / PROM / Strength Strength      Strength   Overall Strength Within functional limits for tasks performed    Strength Assessment Site Hip;Knee;Ankle    Right/Left Hip Right;Left    Right Hip Flexion 4+/5    Right Hip Extension 4+/5   limited ROM   Right Hip External Rotation  4+/5    Right Hip Internal Rotation 4+/5    Right Hip ABduction 4+/5    Right Hip ADduction 4+/5    Left Hip Flexion 4+/5    Left Hip Extension 4+/5   limited ROM   Left Hip External Rotation 4+/5    Left Hip Internal Rotation 4+/5    Left Hip ABduction 4+/5    Left Hip ADduction 4+/5    Right/Left Knee Right;Left    Right Knee Flexion 5/5    Right Knee Extension 5/5    Left Knee Flexion 5/5    Left Knee Extension 5/5    Right/Left Ankle Right;Left    Right Ankle Dorsiflexion 4+/5    Right Ankle Plantar Flexion --   5-/5   Left Ankle Dorsiflexion 4/5    Left Ankle Plantar Flexion --   5-/5     Flexibility   Soft Tissue Assessment /Muscle Length yes    Hamstrings mod tight B    Quadriceps mod tight B    Piriformis mod tight B      Ambulation/Gait   Ambulation/Gait Yes    Assistive device Straight cane;None    Gait Pattern Step-through pattern;Decreased arm swing - left;Decreased arm swing - right;Decreased trunk rotation;Wide base of support;Trunk flexed    Ambulation Surface Level;Indoor    Gait velocity 3.99 ft/sec   w/o AD     Standardized Balance Assessment   Standardized Balance Assessment Berg Balance Test;Timed Up and Go Test;Five Times Sit to Stand;10 meter walk test    Five times sit to stand comments  18.18 sec    10 Meter Walk 8.22 sec   no AD     Berg Balance Test   Sit to Stand Able to stand without using hands and stabilize independently    Standing Unsupported Able to stand safely 2 minutes    Sitting with Back Unsupported but Feet Supported on Floor or Stool Able to sit safely and securely 2 minutes    Stand to Sit Sits safely  with minimal use of hands    Transfers Able to transfer safely, minor use of hands    Standing Unsupported with Eyes Closed Able to stand 10 seconds with supervision    Standing Unsupported with Feet Together Able to place feet together independently and stand 1 minute safely    From Standing, Reach Forward with Outstretched Arm Can reach confidently >25 cm (10")    From Standing Position, Pick up Object from Floor Able to pick up shoe safely and easily    From Standing Position, Turn to Look Behind Over each Shoulder Looks behind one side only/other side shows less weight shift    Turn 360 Degrees Able to turn 360 degrees safely but slowly    Standing Unsupported, Alternately Place Feet on Step/Stool Able to stand independently and safely and complete 8 steps in 20 seconds    Standing Unsupported, One Foot in Front Able to take small step independently and hold 30 seconds    Standing on One Leg Tries to lift leg/unable to hold 3 seconds but remains standing independently    Total Score 47    Berg comment: Scores 46-51 indicate moderate fall risk (>50%)      Timed Up and Go Test   Normal TUG (seconds) 10.47    Cognitive TUG (seconds) 11.31   difficulty with cognitive task     Functional Gait  Assessment   Gait assessed  Yes    Gait Level Surface Walks 20 ft in less than 5.5 sec, no assistive devices, good speed, no evidence for imbalance, normal gait pattern, deviates no more than 6 in outside of the 12 in walkway width.    Change in Gait Speed Able to smoothly change walking speed without loss of balance or gait deviation. Deviate no more than 6 in outside of the 12 in walkway width.    Gait with Horizontal Head Turns Performs head turns smoothly with slight change in gait velocity (eg, minor disruption to smooth gait path), deviates 6-10 in outside 12 in walkway width, or uses an assistive device.    Gait with Vertical Head Turns Performs task with slight change in gait velocity (eg, minor  disruption to smooth gait path), deviates 6 - 10 in outside 12 in walkway width or uses assistive device    Gait and Pivot Turn Pivot turns safely within 3 sec and stops quickly with no loss of balance.    Step Over Obstacle Is able to step over one shoe box (4.5 in total height) without changing gait speed. No evidence of imbalance.    Gait with Narrow Base of Support Ambulates less than 4 steps heel to toe or cannot perform without assistance.    Gait with Eyes Closed Cannot walk 20 ft without assistance, severe gait deviations or imbalance, deviates greater than 15 in outside 12 in walkway width or will not attempt task.    Ambulating Backwards Walks 20 ft, slow speed, abnormal gait pattern, evidence for imbalance, deviates 10-15 in outside 12 in walkway width.    Steps Alternating feet, must use rail.    Total Score 18    FGA comment: Scores < 19 = high risk fall                        Objective measurements completed on examination: See above findings.                  PT Short Term Goals - 10/26/20  [place] #1   Title Patient will be independent with initial HEP    Status New    Target Date 11/16/20               PT Long Term Goals - 10/26/20 1453       PT LONG TERM GOAL #1   Title Patient will be independent with ongoing/advanced HEP for self-management at home    Status New    Target Date 12/07/20      PT LONG TERM GOAL #2   Title Patient will improve 5x STS time to </= 15 seconds for improved efficiency and safety with transfers    Baseline 10/26/20 - 18.18 sec    Status New    Target Date 12/07/20      PT LONG TERM GOAL #3   Title Patient will improve Berg score to >/= 52/56 to improve safety and stability with ADLs in standing and reduce risk for falls    Baseline 10/26/20 - 47/56    Status New    Target Date 12/07/20      PT LONG TERM GOAL #4   Title Patient will improve FGA score to >/= 22/30 to improve  gait stability and reduce risk for falls    Baseline 10/26/20 - 18/30    Status New    Target Date 12/07/20      PT LONG TERM GOAL #5   Title Patient will ambulate outdoor, unlevel surfaces, at least 1000 ft, independently, no LOB, for improved community gait    Status New    Target Date 12/07/20      PT LONG TERM GOAL #6   Title Patient will verbalize understanding of fall prevention in the home environment    Status New    Target Date 12/07/20      PT LONG TERM GOAL #7   Title Patient will verbalize understanding of local Parkinson's disease resources    Status New    Target Date 12/07/20                    Plan - 10/26/20 1919     Clinical Impression Victor Osborne is an 87 y/o male who presents to OP PT with 15+ year history of Parkinson's disease, who presents to OP PT today with concerns of worsening of his tremors and gradually worsening balance over recent months, although no falls reported in past 6 months. He assists his live-in girlfriend with most of her daily activities and notes that he feels off-balance when she leans on him for support. He presents with decreased timing and coordination of gait, decreased balance, abnormal posture, bradykinesia with transfers, and postural instability. Berg score of 47/56 indicates a moderate fall risk and FGA score of 18/30 indicates a high fall risk.  He has slowed outdoor walking due to balance issues. Victor Osborne will benefit from skilled PT to address above deficits affecting mobility and gait instability/balance to allow for improved QOL and functional mobility/gait with reduced fall risk.     Personal Factors and Comorbidities Age;Comorbidity 3+;Past/Current Experience;Social Background;Time since onset of injury/illness/exacerbation    Comorbidities PPM 2 1st degree heart block & SA node dysfunction, OSA, COPD, hypothyroidism, chronic PTSD, bipolar I disorder, decreased endurance since COVID-19 in ~March 2022    Examination-Activity  Limitations Caring for Others;Transfers;Stand;Locomotion Level;Stairs;Squat    Examination-Participation Restrictions Community Activity;Interpersonal Relationship    Stability/Clinical Decision Making Stable/Uncomplicated    Clinical Decision Making  Low    Rehab Potential Good    PT Frequency 2x / week    PT Duration 6 weeks    PT Treatment/Interventions ADLs/Self Care Home Management;DME Instruction;Gait training;Stair training;Functional mobility training;Therapeutic activities;Therapeutic exercise;Balance training;Neuromuscular re-education;Patient/family education;Manual techniques    PT Next Visit Plan Establish initial HEP    Consulted and Agree with Plan of Care Patient             Patient will benefit from skilled therapeutic intervention in order to improve the following deficits and impairments:  Abnormal gait, Decreased activity tolerance, Decreased balance, Decreased endurance, Decreased mobility, Decreased safety awareness, Difficulty walking, Impaired perceived functional ability, Impaired flexibility, Postural dysfunction  Visit Diagnosis: Other abnormalities of gait and mobility  Unsteadiness on feet  Abnormal posture     Problem List Patient Active Problem List   Diagnosis Date Noted   Chronic post-traumatic stress disorder 12/05/2017   Bipolar I disorder, current or most recent episode depressed, in partial remission ([place]) 12/05/2017   Hypothyroidism 10/11/2016   Parkinson disease ([place]) 09/08/2016   Pacemaker 09/24/2013   Sinoatrial node dysfunction ([place]) 06/19/2013   First degree AV block 04/09/2013   Allergic rhinitis 03/17/2013   Obstructive sleep apnea 12/18/2006   COPD mixed type ([place]) 12/18/2006   MELANOMA, HX OF 12/18/2006    Percival Spanish, PT 10/26/2020, 7:48 PM  [facility] [address]  [place], [place], [address] Phone: [phone]   Fax:  [phone]  Name: Victor Osborne MRN: [id number] Date of Birth: Aug 05, 1932

## 2020-10-28 ENCOUNTER — Encounter: Payer: Self-pay | Admitting: Physical Therapy

## 2020-10-28 ENCOUNTER — Other Ambulatory Visit: Payer: Self-pay

## 2020-10-28 ENCOUNTER — Ambulatory Visit: Payer: Medicare Other | Admitting: Physical Therapy

## 2020-10-28 DIAGNOSIS — R2689 Other abnormalities of gait and mobility: Secondary | ICD-10-CM

## 2020-10-28 DIAGNOSIS — R293 Abnormal posture: Secondary | ICD-10-CM

## 2020-10-28 DIAGNOSIS — R2681 Unsteadiness on feet: Secondary | ICD-10-CM

## 2020-10-28 NOTE — Patient Instructions (Signed)
     Access Code: [email or web address] URL: [email or web address] Date: 10/28/2020 Prepared by: Annie Paras  Exercises Seated Hamstring Stretch with Strap - 2 x daily - 7 x weekly - 3 reps - 30 sec hold Seated Figure 4 Piriformis Stretch - 2 x daily - 7 x weekly - 3 reps - 30 sec hold Seated Hip Flexor Stretch - 2 x daily - 7 x weekly - 3 reps - 30 sec hold Standing Hip Flexor Stretch - 2 x daily - 7 x weekly - 2 sets - 10 reps - 30 sec hold

## 2020-10-28 NOTE — Therapy (Signed)
[facility] [address]  [place], [place], [address] Phone: [phone]   Fax:  [phone]  Physical Therapy Treatment  Patient Details  Name: Victor Osborne MRN: [id number] Date of Birth: 1932-03-26 Referring Provider (PT): Alonza Bogus, DO   Encounter Date: 10/28/2020   PT End of Session - 10/28/20 1108     Visit Number 2    Number of Visits 12    Date for PT Re-Evaluation 12/07/20    Authorization Type Medicare & [employer]    PT Start Time 1108    PT Stop Time 1151    PT Time Calculation (min) 43 min    Activity Tolerance Patient tolerated treatment well    Behavior During Therapy [facility] for tasks assessed/performed             Past Medical History:  Diagnosis Date   Bipolar 1 disorder ([place])    COPD (chronic obstructive pulmonary disease) ([place])    Depression    First degree AV block    Hypertension    pt denies but was in medical record   Hypoglycemia, unspecified    Myalgia and myositis, unspecified    Obesity    Pneumonia 1970   Skin cancer (melanoma) ([place])    Thyroid disease    Unspecified hypothyroidism    UPJ obstruction, congenital     Past Surgical History:  Procedure Laterality Date   APPENDECTOMY  1939   malignant melanoma removed from right forehead  2008   PERMANENT PACEMAKER INSERTION N/A 06/19/2013   Procedure: PERMANENT PACEMAKER INSERTION;  Surgeon: Evans Lance, MD;  Location: [facility] CATH LAB;  Service: Cardiovascular;  Laterality: N/A;   right big toe  surgery  2009   TRANSURETHRAL RESECTION OF PROSTATE  02/04/2011   Procedure: TRANSURETHRAL RESECTION OF THE PROSTATE (TURP);  Surgeon: Dutch Gray, MD;  Location: [facility] ORS;  Service: Urology;  Laterality: N/A;   URETHRA SURGERY  2010   reconstruction    There were no vitals filed for this visit.   Subjective Assessment - 10/28/20 1112     Subjective Pt noting a "catch" in his knee briefly upon starting warm-up on  NuStep which resolved w/in the 1st minute.    Pertinent History bipolar    Patient Stated Goals "better balance and coordination"    Currently in Pain? No/denies                               [facility] Adult PT Treatment/Exercise - 10/28/20 1108       Exercises   Exercises Knee/Hip      Knee/Hip Exercises: Stretches   Passive Hamstring Stretch Right;Left;2 reps;30 seconds    Passive Hamstring Stretch Limitations seated hip hinge +/- strap for gastroc stretch    Hip Flexor Stretch Right;Left;2 reps;30 seconds    Hip Flexor Stretch Limitations standing runner's stretch at wall + hip/trunk extension & seated lunge position over edge of chair - pt noting better stretch with latter    Piriformis Stretch Right;Left;3 reps;30 seconds    Piriformis Stretch Limitations seated figure-4 with overpressure, KTOS & figure-4 + hip hinge - pt noting best stretch with 3rd version    Gastroc Stretch Right;Left;1 rep;30 seconds    Gastroc Stretch Limitations in combination with hip flexor stretch at wall      Knee/Hip Exercises: Aerobic   Nustep L4 x 6 min (UE/LE to promote reciprocal  movement)               [facility]) - 10/28/20 1108     PWR! exercises Moves in sitting    PWR! Up x10    PWR! Rock x10    PWR! Twist x10   Boom whackers for added reach   [employer]! Step x10    Comments 1st 3 PWR Moves completed with Airex pad in chair for elevated height d/t pt's long legs but removed during PWR Step to reduce friction                  PT Education - 10/28/20 1151     Education Details Initial HEP - LE stretches - Access Code: [email or web address] & Seated PWR! Moves    Person(s) Educated Patient    Methods Explanation;Demonstration;Verbal cues;Handout    Comprehension Verbalized understanding;Verbal cues required;Returned demonstration;Need further instruction              PT Short Term Goals - 10/28/20 1113       PT SHORT TERM GOAL #1   Title Patient will be  independent with initial HEP    Status On-going    Target Date 11/16/20               PT Long Term Goals - 10/28/20 1114       PT LONG TERM GOAL #1   Title Patient will be independent with ongoing/advanced HEP for self-management at home    Status On-going    Target Date 12/07/20      PT LONG TERM GOAL #2   Title Patient will improve 5x STS time to </= 15 seconds for improved efficiency and safety with transfers    Baseline 10/26/20 - 18.18 sec    Status On-going    Target Date 12/07/20      PT LONG TERM GOAL #3   Title Patient will improve Berg score to >/= 52/56 to improve safety and stability with ADLs in standing and reduce risk for falls    Baseline 10/26/20 - 47/56    Status On-going    Target Date 12/07/20      PT LONG TERM GOAL #4   Title Patient will improve FGA score to >/= 22/30 to improve gait stability and reduce risk for falls    Baseline 10/26/20 - 18/30    Status On-going    Target Date 12/07/20      PT LONG TERM GOAL #5   Title Patient will ambulate outdoor, unlevel surfaces, at least 1000 ft, independently, no LOB, for improved community gait    Status On-going    Target Date 12/07/20      PT LONG TERM GOAL #6   Title Patient will verbalize understanding of fall prevention in the home environment    Status On-going    Target Date 12/07/20      PT LONG TERM GOAL #7   Title Patient will verbalize understanding of local Parkinson's disease resources    Status On-going    Target Date 12/07/20                   Plan - 10/28/20 1151     Clinical Impression Statement Provided instruction in initial HEP incorporating proximal LE stretches to address tightness limiting good posture as well as introduction of seated PWR! Moves to work on posture, range of motion and functional mobility. Pt noting good but tolerable stretches with versions of stretches included in HEP and cautioned not to  push into painful motions. Some limitations noted with  coordination of motion with seated PWR! Moves but able to achieve good rhythm for all movements except PWR! Step where he had difficulty with trailing leg. Will continue to progress postural/core flexibility and strengthening in upcoming visit including progression of PWR! Moves as indicated.    Comorbidities PPM 2 1st degree heart block & SA node dysfunction, OSA, COPD, hypothyroidism, chronic PTSD, bipolar I disorder, decreased endurance since COVID-19 in ~March 2022    Rehab Potential Good    PT Frequency 2x / week    PT Duration 6 weeks    PT Treatment/Interventions ADLs/Self Care Home Management;DME Instruction;Gait training;Stair training;Functional mobility training;Therapeutic activities;Therapeutic exercise;Balance training;Neuromuscular re-education;Patient/family education;Manual techniques    PT Next Visit Plan Review initial HEP; core/postural strengthening; PWR! Moves in sitting, progressing to other positions as tolerated    PT Home Exercise Plan Access Code: [email or web address] (10/12); PWR! Moves - Seated (10/12)    Consulted and Agree with Plan of Care Patient             Patient will benefit from skilled therapeutic intervention in order to improve the following deficits and impairments:  Abnormal gait, Decreased activity tolerance, Decreased balance, Decreased endurance, Decreased mobility, Decreased safety awareness, Difficulty walking, Impaired perceived functional ability, Impaired flexibility, Postural dysfunction  Visit Diagnosis: Other abnormalities of gait and mobility  Unsteadiness on feet  Abnormal posture     Problem List Patient Active Problem List   Diagnosis Date Noted   Chronic post-traumatic stress disorder 12/05/2017   Bipolar I disorder, current or most recent episode depressed, in partial remission ([place]) 12/05/2017   Hypothyroidism 10/11/2016   Parkinson disease ([place]) 09/08/2016   Pacemaker 09/24/2013   Sinoatrial node dysfunction ([place]) 06/19/2013    First degree AV block 04/09/2013   Allergic rhinitis 03/17/2013   Obstructive sleep apnea 12/18/2006   COPD mixed type ([place]) 12/18/2006   MELANOMA, HX OF 12/18/2006    Victor Spanish, PT 10/28/2020, 12:28 PM  [facility] [address]  [place], [place], [address] Phone: [phone]   Fax:  [phone]  Name: Victor Osborne MRN: [id number] Date of Birth: 01-Aug-1932

## 2020-11-04 ENCOUNTER — Other Ambulatory Visit: Payer: Self-pay

## 2020-11-04 ENCOUNTER — Ambulatory Visit: Payer: Medicare Other

## 2020-11-04 DIAGNOSIS — R293 Abnormal posture: Secondary | ICD-10-CM

## 2020-11-04 DIAGNOSIS — R2681 Unsteadiness on feet: Secondary | ICD-10-CM

## 2020-11-04 DIAGNOSIS — R2689 Other abnormalities of gait and mobility: Secondary | ICD-10-CM

## 2020-11-04 NOTE — Therapy (Signed)
[facility] [address]  [place], [place], [address] Phone: [phone]   Fax:  [phone]  Physical Therapy Treatment  Patient Details  Name: Victor Osborne MRN: [id number] Date of Birth: July 19, 1932 Referring Provider (PT): Alonza Bogus, DO   Encounter Date: 11/04/2020   PT End of Session - 11/04/20 0938     Visit Number 3    Number of Visits 12    Date for PT Re-Evaluation 12/07/20    Authorization Type Medicare & [employer]    PT Start Time 0845    PT Stop Time 0928    PT Time Calculation (min) 43 min    Activity Tolerance Patient tolerated treatment well    Behavior During Therapy [facility] for tasks assessed/performed             Past Medical History:  Diagnosis Date   Bipolar 1 disorder ([place])    COPD (chronic obstructive pulmonary disease) ([place])    Depression    First degree AV block    Hypertension    pt denies but was in medical record   Hypoglycemia, unspecified    Myalgia and myositis, unspecified    Obesity    Pneumonia 1970   Skin cancer (melanoma) ([place])    Thyroid disease    Unspecified hypothyroidism    UPJ obstruction, congenital     Past Surgical History:  Procedure Laterality Date   APPENDECTOMY  1939   malignant melanoma removed from right forehead  2008   PERMANENT PACEMAKER INSERTION N/A 06/19/2013   Procedure: PERMANENT PACEMAKER INSERTION;  Surgeon: Evans Lance, MD;  Location: [facility] CATH LAB;  Service: Cardiovascular;  Laterality: N/A;   right big toe  surgery  2009   TRANSURETHRAL RESECTION OF PROSTATE  02/04/2011   Procedure: TRANSURETHRAL RESECTION OF THE PROSTATE (TURP);  Surgeon: Dutch Gray, MD;  Location: [facility];  Service: Urology;  Laterality: N/A;   URETHRA SURGERY  2010   reconstruction    There were no vitals filed for this visit.   Subjective Assessment - 11/04/20 0848     Subjective Pt reports that he is doing well no questions or concerns with HEP.     Pertinent History bipolar    Patient Stated Goals "better balance and coordination"    Currently in Pain? No/denies                               [facility] Adult PT Treatment/Exercise - 11/04/20 0001       Exercises   Exercises Knee/Hip;Lumbar      Lumbar Exercises: Stretches   Other Lumbar Stretch Exercise B seated trunk rotations 2x10; cues for steady pace      Lumbar Exercises: Seated   Other Seated Lumbar Exercises horizontal abduction in sitting 2x10; cues for pace    Other Seated Lumbar Exercises rows and extensions with red TB 20 reps each      Knee/Hip Exercises: Stretches   Passive Hamstring Stretch Right;Left;2 reps;30 seconds    Passive Hamstring Stretch Limitations seated hip hinge +/- strap for gastroc stretch    Hip Flexor Stretch Right;Left;2 reps;30 seconds    Hip Flexor Stretch Limitations seated lunge postion    Piriformis Stretch Both;30 seconds    Piriformis Stretch Limitations seated figure 4      Knee/Hip Exercises: Aerobic   Nustep L4 x 6 min (UE/LE to promote reciprocal movement)  Knee/Hip Exercises: Seated   Ball Squeeze 10x5"    Sit to Sand 2 sets;5 reps;without UE support   reaching for full hip extension                      PT Short Term Goals - 10/28/20 1113       PT SHORT TERM GOAL #1   Title Patient will be independent with initial HEP    Status On-going    Target Date 11/16/20               PT Long Term Goals - 10/28/20 1114       PT LONG TERM GOAL #1   Title Patient will be independent with ongoing/advanced HEP for self-management at home    Status On-going    Target Date 12/07/20      PT LONG TERM GOAL #2   Title Patient will improve 5x STS time to </= 15 seconds for improved efficiency and safety with transfers    Baseline 10/26/20 - 18.18 sec    Status On-going    Target Date 12/07/20      PT LONG TERM GOAL #3   Title Patient will improve Berg score to >/= 52/56 to improve safety  and stability with ADLs in standing and reduce risk for falls    Baseline 10/26/20 - 47/56    Status On-going    Target Date 12/07/20      PT LONG TERM GOAL #4   Title Patient will improve FGA score to >/= 22/30 to improve gait stability and reduce risk for falls    Baseline 10/26/20 - 18/30    Status On-going    Target Date 12/07/20      PT LONG TERM GOAL #5   Title Patient will ambulate outdoor, unlevel surfaces, at least 1000 ft, independently, no LOB, for improved community gait    Status On-going    Target Date 12/07/20      PT LONG TERM GOAL #6   Title Patient will verbalize understanding of fall prevention in the home environment    Status On-going    Target Date 12/07/20      PT LONG TERM GOAL #7   Title Patient will verbalize understanding of local Parkinson's disease resources    Status On-going    Target Date 12/07/20                   Plan - 11/04/20 0939     Clinical Impression Statement Pt demonstrated B tightness in LE muscles stretched today. Worked again on posture today, he needs direction on keeping slow/steady pace with exercises. Cues given to emphasize the periscap contraction with the postural strengthening exercises. He demonstrated some fatigue with the STS exercise and difficulty due to balance. He would continue to benefit from PT to improve balance and postural control.    Personal Factors and Comorbidities Age;Comorbidity 3+;Past/Current Experience;Social Background;Time since onset of injury/illness/exacerbation    Comorbidities PPM 2 1st degree heart block & SA node dysfunction, OSA, COPD, hypothyroidism, chronic PTSD, bipolar I disorder, decreased endurance since COVID-19 in ~March 2022    PT Frequency 2x / week    PT Duration 6 weeks    PT Treatment/Interventions ADLs/Self Care Home Management;DME Instruction;Gait training;Stair training;Functional mobility training;Therapeutic activities;Therapeutic exercise;Balance training;Neuromuscular  re-education;Patient/family education;Manual techniques    PT Next Visit Plan core/postural strengthening; PWR! Moves in sitting, progressing to other positions as tolerated    PT Home Exercise Plan Access  Code: [email or web address] (10/12); PWR! Moves - Seated (10/12)    Consulted and Agree with Plan of Care Patient             Patient will benefit from skilled therapeutic intervention in order to improve the following deficits and impairments:  Abnormal gait, Decreased activity tolerance, Decreased balance, Decreased endurance, Decreased mobility, Decreased safety awareness, Difficulty walking, Impaired perceived functional ability, Impaired flexibility, Postural dysfunction  Visit Diagnosis: Other abnormalities of gait and mobility  Unsteadiness on feet  Abnormal posture     Problem List Patient Active Problem List   Diagnosis Date Noted   Chronic post-traumatic stress disorder 12/05/2017   Bipolar I disorder, current or most recent episode depressed, in partial remission ([place]) 12/05/2017   Hypothyroidism 10/11/2016   Parkinson disease ([place]) 09/08/2016   Pacemaker 09/24/2013   Sinoatrial node dysfunction ([place]) 06/19/2013   First degree AV block 04/09/2013   Allergic rhinitis 03/17/2013   Obstructive sleep apnea 12/18/2006   COPD mixed type ([place]) 12/18/2006   MELANOMA, HX OF 12/18/2006    Artist Pais, PTA 11/04/2020, 10:48 AM  [facility] [address]  [place], [place], [address] Phone: [phone]   Fax:  [phone]  Name: Victor Osborne MRN: [id number] Date of Birth: Jul 11, 1932

## 2020-11-11 ENCOUNTER — Ambulatory Visit: Payer: Medicare Other | Admitting: Physical Therapy

## 2020-11-11 ENCOUNTER — Encounter: Payer: Self-pay | Admitting: Physical Therapy

## 2020-11-11 ENCOUNTER — Other Ambulatory Visit: Payer: Self-pay

## 2020-11-11 DIAGNOSIS — R2681 Unsteadiness on feet: Secondary | ICD-10-CM

## 2020-11-11 DIAGNOSIS — R2689 Other abnormalities of gait and mobility: Secondary | ICD-10-CM

## 2020-11-11 DIAGNOSIS — R293 Abnormal posture: Secondary | ICD-10-CM

## 2020-11-11 NOTE — Therapy (Signed)
[facility] [address]  [place], [place], [address] Phone: [phone]   Fax:  [phone]  Physical Therapy Treatment  Patient Details  Name: Victor Osborne MRN: [id number] Date of Birth: July 03, 1932 Referring Provider (PT): Alonza Bogus, DO   Encounter Date: 11/11/2020   PT End of Session - 11/11/20 0804     Visit Number 4    Number of Visits 12    Date for PT Re-Evaluation 12/07/20    Authorization Type [employer] & [employer]    PT Start Time 0804    PT Stop Time 0848    PT Time Calculation (min) 44 min    Activity Tolerance Patient tolerated treatment well    Behavior During Therapy [facility] for tasks assessed/performed             Past Medical History:  Diagnosis Date   Bipolar 1 disorder ([place])    COPD (chronic obstructive pulmonary disease) ([place])    Depression    First degree AV block    Hypertension    pt denies but was in medical record   Hypoglycemia, unspecified    Myalgia and myositis, unspecified    Obesity    Pneumonia 1970   Skin cancer (melanoma) ([place])    Thyroid disease    Unspecified hypothyroidism    UPJ obstruction, congenital     Past Surgical History:  Procedure Laterality Date   APPENDECTOMY  1939   malignant melanoma removed from right forehead  2008   PERMANENT PACEMAKER INSERTION N/A 06/19/2013   Procedure: PERMANENT PACEMAKER INSERTION;  Surgeon: Evans Lance, MD;  Location: [facility] CATH LAB;  Service: Cardiovascular;  Laterality: N/A;   right big toe  surgery  2009   TRANSURETHRAL RESECTION OF PROSTATE  02/04/2011   Procedure: TRANSURETHRAL RESECTION OF THE PROSTATE (TURP);  Surgeon: Dutch Gray, MD;  [facility]: [facility];  Service: Urology;  Laterality: N/A;   URETHRA SURGERY  2010   reconstruction    There were no vitals filed for this visit.   Subjective Assessment - 11/11/20 0807     Subjective Pt reports he is a little tired this morning - feels like he has  "run out of steam".    Pertinent History bipolar    Patient Stated Goals "better balance and coordination"                               [facility] Adult PT Treatment/Exercise - 11/11/20 0804       Ambulation/Gait   Ambulation/Gait Assistance 5: Supervision    Ambulation/Gait Assistance Details PWR! Walk - cues for exaggerated reciprocal arm swing    Ambulation Distance (Feet) 200 Feet   x 2   Gait Comments Pt having difficulty maintaining reciprocal arm swing pattern, reverting to ipsilateral arm swing      Knee/Hip Exercises: Aerobic   Nustep L5 x 6 min (UE/LE to promote reciprocal movement)               PWR [facility]) - 11/11/20 0804     PWR! exercises Moves in sitting;Moves in standing;Functional moves    PWR! Up x10    PWR! Rock x10    PWR! Twist x10    PWR Step x10    PWR! Step Through Forward/Back PWR! Step forward x 10 alternating LEs, backward x 10 alternating LEs    PWR! Sit to Stand x10  PWR! Up x10    PWR! Rock x10    PWR! Twist x10   Boom whackers for added reach   [employer]! Step x10                    PT Short Term Goals - 10/28/20 1113       PT SHORT TERM GOAL #1   Title Patient will be independent with initial HEP    Status On-going    Target Date 11/16/20               PT Long Term Goals - 10/28/20 1114       PT LONG TERM GOAL #1   Title Patient will be independent with ongoing/advanced HEP for self-management at home    Status On-going    Target Date 12/07/20      PT LONG TERM GOAL #2   Title Patient will improve 5x STS time to </= 15 seconds for improved efficiency and safety with transfers    Baseline 10/26/20 - 18.18 sec    Status On-going    Target Date 12/07/20      PT LONG TERM GOAL #3   Title Patient will improve Berg score to >/= 52/56 to improve safety and stability with ADLs in standing and reduce risk for falls    Baseline 10/26/20 - 47/56    Status On-going    Target Date 12/07/20      PT LONG  TERM GOAL #4   Title Patient will improve FGA score to >/= 22/30 to improve gait stability and reduce risk for falls    Baseline 10/26/20 - 18/30    Status On-going    Target Date 12/07/20      PT LONG TERM GOAL #5   Title Patient will ambulate outdoor, unlevel surfaces, at least 1000 ft, independently, no LOB, for improved community gait    Status On-going    Target Date 12/07/20      PT LONG TERM GOAL #6   Title Patient will verbalize understanding of fall prevention in the home environment    Status On-going    Target Date 12/07/20      PT LONG TERM GOAL #7   Title Patient will verbalize understanding of local Parkinson's disease resources    Status On-going    Target Date 12/07/20                   Plan - 11/11/20 0808     Clinical Impression Statement Victor Osborne reports initial HEP stretches are going well and denies need for further review, therefore proceeded with PWR! Moves training. Reviewed seated moves with pt able to provide good return demonstration with good rhythm and so progressed to [facility]! Moves in standing. Pt a little more hesitant with standing moves, therefore provided chairs in front of and behind patient for safety but pt able to complete all movement patterns in standing with supervision. Also introduce forward and backward PWR! Step with SBA guarding provided for backward step. Session concluded with carryover of PWR! moves into PWR! walk promoting increased step length and foot clearance along with exaggerated reciprocal arm swing - pt able to demonstrate good LE step pattern but struggled with coordination of reciprocal arm swing. Deferred addition of standing PWR! Moves to HEP pending further practice to ensure adequate safety for home performance.    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 Moves in sitting & standing, progressing to other positions as tolerated    PT Home Exercise Plan Access Code: [email or web address] (10/12); PWR! Moves - Seated (10/12)    Consulted and Agree with Plan of Care Patient             Patient will benefit from skilled therapeutic intervention in order to improve the following deficits and impairments:  Abnormal gait, Decreased activity tolerance, Decreased balance, Decreased endurance, Decreased mobility, Decreased safety awareness, Difficulty walking, Impaired perceived functional ability, Impaired flexibility, Postural dysfunction  Visit Diagnosis: Other abnormalities of gait and mobility  Unsteadiness on feet  Abnormal posture     Problem List Patient Active Problem List   Diagnosis Date Noted   Chronic post-traumatic stress disorder 12/05/2017   Bipolar I disorder, current or most recent episode depressed, in partial remission ([place]) 12/05/2017   Hypothyroidism 10/11/2016   Parkinson disease ([place]) 09/08/2016   Pacemaker 09/24/2013   Sinoatrial node dysfunction ([place]) 06/19/2013   First degree AV block 04/09/2013   Allergic rhinitis 03/17/2013   Obstructive sleep apnea 12/18/2006   COPD mixed type ([place]) 12/18/2006   MELANOMA, HX OF 12/18/2006    Percival Spanish, PT 11/11/2020, 1:56 PM  [facility] [address]  [place], [place], [address] Phone: [phone]   Fax:  [phone]  Name: Victor Osborne MRN: [id number] Date of Birth: August 22, 1932

## 2020-11-17 ENCOUNTER — Other Ambulatory Visit: Payer: Self-pay

## 2020-11-17 ENCOUNTER — Ambulatory Visit: Payer: Medicare Other | Attending: Neurology

## 2020-11-17 DIAGNOSIS — R2689 Other abnormalities of gait and mobility: Secondary | ICD-10-CM | POA: Insufficient documentation

## 2020-11-17 DIAGNOSIS — R293 Abnormal posture: Secondary | ICD-10-CM | POA: Insufficient documentation

## 2020-11-17 DIAGNOSIS — R2681 Unsteadiness on feet: Secondary | ICD-10-CM | POA: Insufficient documentation

## 2020-11-17 NOTE — Therapy (Signed)
[facility] [address]  [place], [place], [address] Phone: [phone]   Fax:  [phone]  Physical Therapy Treatment  Patient Details  Name: Victor Osborne MRN: [id number] Date of Birth: [birth year] Referring Provider (PT): Alonza Bogus, DO   Encounter Date: 11/17/2020   PT End of Session - 11/17/20 1158     Visit Number 5    Number of Visits 12    Date for PT Re-Evaluation 12/07/20    Authorization Type [employer] & [employer]    PT Start Time 1107    PT Stop Time 1149    PT Time Calculation (min) 42 min    Activity Tolerance Patient tolerated treatment well;Patient limited by fatigue    Behavior During Therapy [facility] for tasks assessed/performed             Past Medical History:  Diagnosis Date   Bipolar 1 disorder ([place])    COPD (chronic obstructive pulmonary disease) ([place])    Depression    First degree AV block    Hypertension    pt denies but was in medical record   Hypoglycemia, unspecified    Myalgia and myositis, unspecified    Obesity    Pneumonia 1970   Skin cancer (melanoma) ([place])    Thyroid disease    Unspecified hypothyroidism    UPJ obstruction, congenital     Past Surgical History:  Procedure Laterality Date   APPENDECTOMY  1939   malignant melanoma removed from right forehead  2008   PERMANENT PACEMAKER INSERTION N/A 06/19/2013   Procedure: PERMANENT PACEMAKER INSERTION;  Surgeon: Evans Lance, MD;  Location: [facility] CATH LAB;  Service: Cardiovascular;  Laterality: N/A;   right big toe  surgery  2009   TRANSURETHRAL RESECTION OF PROSTATE  02/04/2011   Procedure: TRANSURETHRAL RESECTION OF THE PROSTATE (TURP);  Surgeon: Dutch Gray, MD;  [facility];  Service: Urology;  Laterality: N/A;   URETHRA SURGERY  2010   reconstruction    There were no vitals filed for this visit.   Subjective Assessment - 11/17/20 1108     Subjective Pt reports that he was sore after the  workout last session.    Pertinent History bipolar    Patient Stated Goals "better balance and coordination"    Currently in Pain? No/denies                               [facility] Adult PT Treatment/Exercise - 11/17/20 0001       Lumbar Exercises: Stretches   Hip Flexor Stretch Right;Left;3 reps;10 seconds    Hip Flexor Stretch Limitations UE support on treadmill    Gastroc Stretch Right;Left;3 reps;10 seconds    Gastroc Stretch Limitations UE support on treadmill      Lumbar Exercises: Aerobic   Nustep L5x37mn      Lumbar Exercises: Standing   Other Standing Lumbar Exercises standing bird dog with treadmill 2x10   Other Standing Lumbar Exercises B D2 flexion 10 reps      Lumbar Exercises: Seated   Other Seated Lumbar Exercises horizontal abduction in sitting 2x10 and D2 flexion with red TB 2x10; cues for pace    Other Seated Lumbar Exercises B pallof press with green TB 2x10                       PT Short Term Goals -  11/17/20 1128       PT SHORT TERM GOAL #1   Title Patient will be independent with initial HEP    Status Achieved   11/17/20 (pt denies concerns with HEP)   Target Date 11/16/20               PT Long Term Goals - 10/28/20 1114       PT LONG TERM GOAL #1   Title Patient will be independent with ongoing/advanced HEP for self-management at home    Status On-going    Target Date 12/07/20      PT LONG TERM GOAL #2   Title Patient will improve 5x STS time to </= 15 seconds for improved efficiency and safety with transfers    Baseline 10/26/20 - 18.18 sec    Status On-going    Target Date 12/07/20      PT LONG TERM GOAL #3   Title Patient will improve Berg score to >/= 52/56 to improve safety and stability with ADLs in standing and reduce risk for falls    Baseline 10/26/20 - 47/56    Status On-going    Target Date 12/07/20      PT LONG TERM GOAL #4   Title Patient will improve FGA score to >/= 22/30 to improve gait  stability and reduce risk for falls    Baseline 10/26/20 - 18/30    Status On-going    Target Date 12/07/20      PT LONG TERM GOAL #5   Title Patient will ambulate outdoor, unlevel surfaces, at least 1000 ft, independently, no LOB, for improved community gait    Status On-going    Target Date 12/07/20      PT LONG TERM GOAL #6   Title Patient will verbalize understanding of fall prevention in the home environment    Status On-going    Target Date 12/07/20      PT LONG TERM GOAL #7   Title Patient will verbalize understanding of local Parkinson's disease resources    Status On-going    Target Date 12/07/20                   Plan - 11/17/20 1159     Clinical Impression Statement Pt denied any concerns with intial HEP today - STG met. Worked on postural exercises today, giving instructions on slow, steady pace with exercises. Incorporated D2 flexion to help with more trunk rotations and extension, he showed more weakness with the L UE. He had some fatigue with the standing exercises especially with the standing bird dog. He had a considerable lack of hip extension today which was addressed with the standing hip flexor/runner stretch. He had a good response to treatment today.    Personal Factors and Comorbidities Age;Comorbidity 3+;Past/Current Experience;Social Background;Time since onset of injury/illness/exacerbation    Comorbidities PPM 2 1st degree heart block & SA node dysfunction, OSA, COPD, hypothyroidism, chronic PTSD, bipolar I disorder, decreased endurance since COVID-19 in ~March 2022    PT Frequency 2x / week    PT Duration 6 weeks    PT Treatment/Interventions ADLs/Self Care Home Management;DME Instruction;Gait training;Stair training;Functional mobility training;Therapeutic activities;Therapeutic exercise;Balance training;Neuromuscular re-education;Patient/family education;Manual techniques    PT Next Visit Plan core/postural strengthening; PWR! Moves in sitting &  standing, progressing to other positions as tolerated    PT Home Exercise Plan Access Code: [email or web address] (10/12); PWR! Moves - Seated (10/12)    Consulted and Agree with Plan of Care Patient  Patient will benefit from skilled therapeutic intervention in order to improve the following deficits and impairments:  Abnormal gait, Decreased activity tolerance, Decreased balance, Decreased endurance, Decreased mobility, Decreased safety awareness, Difficulty walking, Impaired perceived functional ability, Impaired flexibility, Postural dysfunction  Visit Diagnosis: Other abnormalities of gait and mobility  Unsteadiness on feet  Abnormal posture     Problem List Patient Active Problem List   Diagnosis Date Noted   Chronic post-traumatic stress disorder 12/05/2017   Bipolar I disorder, current or most recent episode depressed, in partial remission ([place]) 12/05/2017   Hypothyroidism 10/11/2016   Parkinson disease ([place]) 09/08/2016   Pacemaker 09/24/2013   Sinoatrial node dysfunction ([place]) 06/19/2013   First degree AV block 04/09/2013   Allergic rhinitis 03/17/2013   Obstructive sleep apnea 12/18/2006   COPD mixed type ([place]) 12/18/2006   MELANOMA, HX OF 12/18/2006    Artist Pais, PTA 11/17/2020, 12:14 PM  [facility] [address]  [place], [place], [address] Phone: [phone]   Fax:  [phone]  Name: Victor Osborne MRN: [id number] Date of Birth: December 15, 1932

## 2020-11-18 ENCOUNTER — Ambulatory Visit: Payer: Medicare Other | Admitting: Physical Therapy

## 2020-11-18 ENCOUNTER — Ambulatory Visit: Payer: Medicare Other | Attending: Internal Medicine

## 2020-11-18 DIAGNOSIS — Z23 Encounter for immunization: Secondary | ICD-10-CM

## 2020-11-18 NOTE — Progress Notes (Signed)
   Covid-19 Vaccination Clinic  Name:  Victor Osborne    MRN: [id number] DOB: 1932-08-26  11/18/2020  Victor Osborne was observed post Covid-19 immunization for 15 minutes without incident. He was provided with Vaccine Information Sheet and instruction to access the V-Safe system.   Victor Osborne was instructed to call 911 with any severe reactions post vaccine: Difficulty breathing  Swelling of face and throat  A fast heartbeat  A bad rash all over body  Dizziness and weakness   Immunizations Administered     Name Date Dose VIS Date Route   Pfizer Covid-19 Vaccine Bivalent Booster 11/18/2020  9:50 AM 0.3 mL 09/16/2020 Intramuscular   Manufacturer: [employer]   Lot: [id number]   [place]: [place], PharmD, MBA Clinical Acute Care Pharmacist

## 2020-11-23 ENCOUNTER — Encounter: Payer: Self-pay | Admitting: Physical Therapy

## 2020-11-23 ENCOUNTER — Ambulatory Visit: Payer: Medicare Other | Admitting: Physical Therapy

## 2020-11-23 ENCOUNTER — Other Ambulatory Visit: Payer: Self-pay

## 2020-11-23 DIAGNOSIS — R2689 Other abnormalities of gait and mobility: Secondary | ICD-10-CM | POA: Diagnosis not present

## 2020-11-23 DIAGNOSIS — R2681 Unsteadiness on feet: Secondary | ICD-10-CM

## 2020-11-23 DIAGNOSIS — R293 Abnormal posture: Secondary | ICD-10-CM

## 2020-11-23 NOTE — Therapy (Signed)
[facility] [address]  [place], [place], [address] Phone: [phone]   Fax:  [phone]  Physical Therapy Treatment  Patient Details  Name: Victor Osborne MRN: [id number] Date of Birth: April 23, 1932 Referring Provider (PT): Alonza Bogus, DO   Encounter Date: 11/23/2020   PT End of Session - 11/23/20 0930     Visit Number 6    Number of Visits 12    Date for PT Re-Evaluation 12/07/20    Authorization Type Medicare & [employer]    PT Start Time 0930    PT Stop Time 6803    PT Time Calculation (min) 44 min    Activity Tolerance Patient tolerated treatment well    Behavior During Therapy [facility] for tasks assessed/performed             Past Medical History:  Diagnosis Date   Bipolar 1 disorder ([place])    COPD (chronic obstructive pulmonary disease) ([place])    Depression    First degree AV block    Hypertension    pt denies but was in medical record   Hypoglycemia, unspecified    Myalgia and myositis, unspecified    Obesity    Pneumonia 1970   Skin cancer (melanoma) ([place])    Thyroid disease    Unspecified hypothyroidism    UPJ obstruction, congenital     Past Surgical History:  Procedure Laterality Date   APPENDECTOMY  1939   malignant melanoma removed from right forehead  2008   PERMANENT PACEMAKER INSERTION N/A 06/19/2013   Procedure: PERMANENT PACEMAKER INSERTION;  Surgeon: Evans Lance, MD;  [facility] CATH LAB;  Service: Cardiovascular;  Laterality: N/A;   right big toe  surgery  2009   TRANSURETHRAL RESECTION OF PROSTATE  02/04/2011   Procedure: TRANSURETHRAL RESECTION OF THE PROSTATE (TURP);  Surgeon: Dutch Gray, MD;  Location: [facility] ORS;  Service: Urology;  Laterality: N/A;   URETHRA SURGERY  2010   reconstruction    There were no vitals filed for this visit.   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    Pertinent History bipolar    Patient Stated Goals "better balance and coordination"    Currently in Pain? No/denies                [facility] PT Assessment - 11/23/20 0930       Assessment   Medical Diagnosis Parkinson's disease    Referring Provider (PT) Alonza Bogus, DO    Onset Date/Surgical Date 10/14/20   MD visit; PD diagnosis ~15 yrs ago   Next MD Visit 04/13/21      Ambulation/Gait   Gait velocity 4.20 ft/sec      Standardized Balance Assessment   Five times sit to stand comments  14.69 sec    10 Meter Walk 7.81 sec   no AD     Timed Up and Go Test   Normal TUG (seconds) 9.15    Cognitive TUG (seconds) 10   difficulty with cognitive task     Functional Gait  Assessment   Gait Level Surface Walks 20 ft in less than 5.5 sec, no assistive devices, good speed, no evidence for imbalance, normal gait pattern, deviates no more than 6 in outside of the 12 in walkway width.    Change in Gait Speed Able to smoothly  change walking speed without loss of balance or gait deviation. Deviate no more than 6 in outside of the 12 in walkway width.    Gait with Horizontal Head Turns Performs head turns smoothly with no change in gait. Deviates no more than 6 in outside 12 in walkway width    Gait with Vertical Head Turns Performs head turns with no change in gait. Deviates no more than 6 in outside 12 in walkway width.    Gait and Pivot Turn Pivot turns safely within 3 sec and stops quickly with no loss of balance.    Step Over Obstacle Is able to step over one shoe box (4.5 in total height) without changing gait speed. No evidence of imbalance.    Gait with Narrow Base of Support Ambulates 4-7 steps.    Gait with Eyes Closed Walks 20 ft, no assistive devices, good speed, no evidence of imbalance, normal gait pattern, deviates no more than 6 in outside 12 in walkway width. Ambulates 20 ft in less than 7 sec.    Ambulating Backwards Walks 20 ft, uses  assistive device, slower speed, mild gait deviations, deviates 6-10 in outside 12 in walkway width.    Steps Alternating feet, must use rail.    Total Score 25                           [facility] Adult PT Treatment/Exercise - 11/23/20 0930       Knee/Hip Exercises: Aerobic   Nustep L6 x 6 min (UE/LE to promote reciprocal movement)               [facility]) - 11/23/20 0930     PWR! exercises Moves in standing;Functional moves    PWR! Up x10    PWR! Rock x10    PWR! Twist x10    PWR Step x10    Comments PWR! Side Step x ~89f each direction & PWR! Walk x ~250 ft in hallway    PWR! Step Through Forward/Back PWR! Step forward x 10 alternating LEs, backward x 10 alternating LEs; PWR! Step Through fwd & back x 5 each side                    PT Short Term Goals - 11/17/20 1128       PT SHORT TERM GOAL #1   Title Patient will be independent with initial HEP    Status Achieved   11/17/20 (pt denies concerns with HEP)   Target Date 11/16/20               PT Long Term Goals - 11/23/20 0934       PT LONG TERM GOAL #1   Title Patient will be independent with ongoing/advanced HEP for self-management at home    Status On-going    Target Date 12/07/20      PT LONG TERM GOAL #2   Title Patient will improve 5x STS time to </= 15 seconds for improved efficiency and safety with transfers    Baseline 10/26/20 - 18.18 sec    Status Achieved   11/23/20 - 5xSTS = 14.69 sec     PT LONG TERM GOAL #3   Title Patient will improve Berg score to >/= 52/56 to improve safety and stability with ADLs in standing and reduce risk for falls    Baseline 10/26/20 - 47/56    Status On-going    Target Date 12/07/20  PT LONG TERM GOAL #4   Title Patient will improve FGA score to >/= 22/30 to improve gait stability and reduce risk for falls    Baseline 10/26/20 - 18/30    Status Achieved   11/23/20 - FGA = 25/30     PT LONG TERM GOAL #5   Title Patient will ambulate  outdoor, unlevel surfaces, at least 1000 ft, independently, no LOB, for improved community gait    Status On-going    Target Date 12/07/20      PT LONG TERM GOAL #6   Title Patient will verbalize understanding of fall prevention in the home environment    Status On-going    Target Date 12/07/20      PT LONG TERM GOAL #7   Title Patient will verbalize understanding of local Parkinson's disease resources    Status On-going    Target Date 12/07/20                   Plan - 11/23/20 0935     Clinical Impression Statement Victor Osborne reports some short-lived soreness after exercises "from using muscles he's not used to using" but no lingering pain. He is progressing well with PT with improving gait speed to 4.20 ft/sec, decreased 5xSTS to 14.69 sec and TUG times to 9.15 sec for normal TUG & 10.00 sec for cognitive TUG, as well as improved FGA score to 25/30 - LTGs #2 & 4 now met. Reviewed standing PWR! Moves along with PWR! step through and walking routines with handouts provided for home performance. Will continue to introduce remaining PWR! positions in upcoming visits.    Comorbidities PPM 2 1st degree heart block & SA node dysfunction, OSA, COPD, hypothyroidism, chronic PTSD, bipolar I disorder, decreased endurance since COVID-19 in ~March 2022    Rehab Potential Good    PT Frequency 2x / week    PT Duration 6 weeks    PT Treatment/Interventions ADLs/Self Care Home Management;DME Instruction;Gait training;Stair training;Functional mobility training;Therapeutic activities;Therapeutic exercise;Balance training;Neuromuscular re-education;Patient/family education;Manual techniques    PT Next Visit Plan core/postural strengthening; PWR! Moves in sitting & standing, progressing to other positions as tolerated    PT Home Exercise Plan Access Code: [email or web address] (10/12); PWR! Moves - Seated (10/12), Standing & PWR! Walking routine (11/7)    Consulted and Agree with Plan of Care Patient              Patient will benefit from skilled therapeutic intervention in order to improve the following deficits and impairments:  Abnormal gait, Decreased activity tolerance, Decreased balance, Decreased endurance, Decreased mobility, Decreased safety awareness, Difficulty walking, Impaired perceived functional ability, Impaired flexibility, Postural dysfunction  Visit Diagnosis: Other abnormalities of gait and mobility  Unsteadiness on feet  Abnormal posture     Problem List Patient Active Problem List   Diagnosis Date Noted   Chronic post-traumatic stress disorder 12/05/2017   Bipolar I disorder, current or most recent episode depressed, in partial remission ([place]) 12/05/2017   Hypothyroidism 10/11/2016   Parkinson disease ([place]) 09/08/2016   Pacemaker 09/24/2013   Sinoatrial node dysfunction ([place]) 06/19/2013   First degree AV block 04/09/2013   Allergic rhinitis 03/17/2013   Obstructive sleep apnea 12/18/2006   COPD mixed type ([place]) 12/18/2006   MELANOMA, HX OF 12/18/2006    Percival Spanish, PT 11/23/2020, 12:15 PM  [facility] [address]  [place], [place], [address] Phone: [phone]   Fax:  [phone]  Name: Victor  Selwyn Osborne MRN: [id number] Date of Birth: 1932-05-02

## 2020-11-26 ENCOUNTER — Other Ambulatory Visit: Payer: Self-pay

## 2020-11-26 ENCOUNTER — Ambulatory Visit: Payer: Medicare Other

## 2020-11-26 DIAGNOSIS — R2689 Other abnormalities of gait and mobility: Secondary | ICD-10-CM | POA: Diagnosis not present

## 2020-11-26 DIAGNOSIS — R293 Abnormal posture: Secondary | ICD-10-CM

## 2020-11-26 DIAGNOSIS — R2681 Unsteadiness on feet: Secondary | ICD-10-CM

## 2020-11-26 NOTE — Therapy (Signed)
[facility] [address]  [place], [place], [address] Phone: [phone]   Fax:  [phone]  Physical Therapy Treatment  Patient Details  Name: Victor Osborne MRN: [id number] Date of Birth: 18-Sep-1932 Referring Provider (PT): Alonza Bogus, DO   Encounter Date: 11/26/2020   PT End of Session - 11/26/20 1014     Visit Number 7    Number of Visits 12    Date for PT Re-Evaluation 12/07/20    Authorization Type Medicare & [employer]    PT Start Time [phone]    PT Stop Time 1011    PT Time Calculation (min) 38 min    Activity Tolerance Patient tolerated treatment well    Behavior During Therapy [facility] for tasks assessed/performed             Past Medical History:  Diagnosis Date   Bipolar 1 disorder ([place])    COPD (chronic obstructive pulmonary disease) ([place])    Depression    First degree AV block    Hypertension    pt denies but was in medical record   Hypoglycemia, unspecified    Myalgia and myositis, unspecified    Obesity    Pneumonia 1970   Skin cancer (melanoma) ([place])    Thyroid disease    Unspecified hypothyroidism    UPJ obstruction, congenital     Past Surgical History:  Procedure Laterality Date   APPENDECTOMY  1939   malignant melanoma removed from right forehead  2008   PERMANENT PACEMAKER INSERTION N/A 06/19/2013   Procedure: PERMANENT PACEMAKER INSERTION;  Surgeon: Evans Lance, MD;  Location: [facility] CATH LAB;  Service: Cardiovascular;  Laterality: N/A;   right big toe  surgery  2009   TRANSURETHRAL RESECTION OF PROSTATE  02/04/2011   Procedure: TRANSURETHRAL RESECTION OF THE PROSTATE (TURP);  Surgeon: Dutch Gray, MD;  Location: [facility] ORS;  Service: Urology;  Laterality: N/A;   URETHRA SURGERY  2010   reconstruction    There were no vitals filed for this visit.   Subjective Assessment - 11/26/20 0935     Subjective A little aching in my L knee today.    Pertinent History bipolar     Currently in Pain? Yes    Pain Score 3     Pain Location Knee    Pain Orientation Left    Pain Descriptors / Indicators Aching    Pain Type Acute pain                               [facility] Adult PT Treatment/Exercise - 11/26/20 0001       Lumbar Exercises: Stretches   Hip Flexor Stretch Right;Left;3 reps;10 seconds    Hip Flexor Stretch Limitations UE support on treadmill    Gastroc Stretch Right;Left;3 reps;10 seconds    Gastroc Stretch Limitations UE support on treadmill      Lumbar Exercises: Aerobic   Nustep L5x34min      Lumbar Exercises: Standing   Functional Squats 5 reps   2x5 reps; with upward reaching for full extension; cues for hip hinge   Other Standing Lumbar Exercises sidesteps along the treadmill 4x    Other Standing Lumbar Exercises B D2 flexion with 2# weights 2x10 reps      Lumbar Exercises: Seated   Sit to Stand 10 reps    Sit to Stand Limitations with yellow weight ball reaching to the  ceiling for hip extension    Other Seated Lumbar Exercises trunk rotations with yellow weight ball 2x10      Knee/Hip Exercises: Standing   Hip Extension AROM;Both;2 sets;10 reps;Knee straight    Extension Limitations UE support; cues for upright posture                       PT Short Term Goals - 11/17/20 1128       PT SHORT TERM GOAL #1   Title Patient will be independent with initial HEP    Status Achieved   11/17/20 (pt denies concerns with HEP)   Target Date 11/16/20               PT Long Term Goals - 11/23/20 0934       PT LONG TERM GOAL #1   Title Patient will be independent with ongoing/advanced HEP for self-management at home    Status On-going    Target Date 12/07/20      PT LONG TERM GOAL #2   Title Patient will improve 5x STS time to </= 15 seconds for improved efficiency and safety with transfers    Baseline 10/26/20 - 18.18 sec    Status Achieved   11/23/20 - 5xSTS = 14.69 sec     PT LONG TERM GOAL #3    Title Patient will improve Berg score to >/= 52/56 to improve safety and stability with ADLs in standing and reduce risk for falls    Baseline 10/26/20 - 47/56    Status On-going    Target Date 12/07/20      PT LONG TERM GOAL #4   Title Patient will improve FGA score to >/= 22/30 to improve gait stability and reduce risk for falls    Baseline 10/26/20 - 18/30    Status Achieved   11/23/20 - FGA = 25/30     PT LONG TERM GOAL #5   Title Patient will ambulate outdoor, unlevel surfaces, at least 1000 ft, independently, no LOB, for improved community gait    Status On-going    Target Date 12/07/20      PT LONG TERM GOAL #6   Title Patient will verbalize understanding of fall prevention in the home environment    Status On-going    Target Date 12/07/20      PT LONG TERM GOAL #7   Title Patient will verbalize understanding of local Parkinson's disease resources    Status On-going    Target Date 12/07/20                   Plan - 11/26/20 1015     Clinical Impression Statement Pt overall had a good response to treatment today with no reports of pain. Some fatigue shown with the STS and squats. Cues needed for hip hinge with the squats. He needs cues to also keep steady pace with exercises for controlled movements. Most interventions focused on improvement posture and trunk movement to improve upright stability. He showed good upright posture with the exercises but a little limited by fatigue with more dynamic exercises.    Personal Factors and Comorbidities Age;Comorbidity 3+;Past/Current Experience;Social Background;Time since onset of injury/illness/exacerbation    Comorbidities PPM 2 1st degree heart block & SA node dysfunction, OSA, COPD, hypothyroidism, chronic PTSD, bipolar I disorder, decreased endurance since COVID-19 in ~March 2022    PT Frequency 2x / week    PT Duration 6 weeks    PT Treatment/Interventions ADLs/Self Care  Home Management;DME Instruction;Gait  training;Stair training;Functional mobility training;Therapeutic activities;Therapeutic exercise;Balance training;Neuromuscular re-education;Patient/family education;Manual techniques    PT Next Visit Plan core/postural strengthening; PWR! Moves in sitting & standing, progressing to other positions as tolerated    PT Home Exercise Plan Access Code: [email or web address] (10/12); PWR! Moves - Seated (10/12), Standing & PWR! Walking routine (11/7)    Consulted and Agree with Plan of Care Patient             Patient will benefit from skilled therapeutic intervention in order to improve the following deficits and impairments:  Abnormal gait, Decreased activity tolerance, Decreased balance, Decreased endurance, Decreased mobility, Decreased safety awareness, Difficulty walking, Impaired perceived functional ability, Impaired flexibility, Postural dysfunction  Visit Diagnosis: Other abnormalities of gait and mobility  Unsteadiness on feet  Abnormal posture     Problem List Patient Active Problem List   Diagnosis Date Noted   Chronic post-traumatic stress disorder 12/05/2017   Bipolar I disorder, current or most recent episode depressed, in partial remission ([place]) 12/05/2017   Hypothyroidism 10/11/2016   Parkinson disease ([place]) 09/08/2016   Pacemaker 09/24/2013   Sinoatrial node dysfunction ([place]) 06/19/2013   First degree AV block 04/09/2013   Allergic rhinitis 03/17/2013   Obstructive sleep apnea 12/18/2006   COPD mixed type ([place]) 12/18/2006   MELANOMA, HX OF 12/18/2006    Artist Pais, PTA 11/26/2020, 11:06 AM  [facility] [address]  [place], [place], [address] Phone: [phone]   Fax:  [phone]  Name: Victor Osborne MRN: [id number] Date of Birth: 16-Jan-1933

## 2020-12-01 ENCOUNTER — Other Ambulatory Visit: Payer: Self-pay

## 2020-12-01 ENCOUNTER — Encounter: Payer: Self-pay | Admitting: Physical Therapy

## 2020-12-01 ENCOUNTER — Ambulatory Visit: Payer: Medicare Other | Admitting: Physical Therapy

## 2020-12-01 DIAGNOSIS — R2689 Other abnormalities of gait and mobility: Secondary | ICD-10-CM | POA: Diagnosis not present

## 2020-12-01 DIAGNOSIS — R2681 Unsteadiness on feet: Secondary | ICD-10-CM

## 2020-12-01 DIAGNOSIS — R293 Abnormal posture: Secondary | ICD-10-CM

## 2020-12-01 NOTE — Therapy (Signed)
[facility] [address]  [place], [place], [address] Phone: [phone]   Fax:  [phone]  Physical Therapy Treatment  Patient Details  Name: Victor Osborne MRN: [id number] Date of Birth: 1932-12-16 Referring Provider (PT): Alonza Bogus, DO   Encounter Date: 12/01/2020   PT End of Session - 12/01/20 1402     Visit Number 8    Number of Visits 12    Date for PT Re-Evaluation 12/07/20    Authorization Type Medicare & [employer]    PT Start Time 1402    PT Stop Time 1450    PT Time Calculation (min) 48 min    Activity Tolerance Patient tolerated treatment well    Behavior During Therapy [facility] for tasks assessed/performed             Past Medical History:  Diagnosis Date   Bipolar 1 disorder ([place])    COPD (chronic obstructive pulmonary disease) ([place])    Depression    First degree AV block    Hypertension    pt denies but was in medical record   Hypoglycemia, unspecified    Myalgia and myositis, unspecified    Obesity    Pneumonia 1970   Skin cancer (melanoma) ([place])    Thyroid disease    Unspecified hypothyroidism    UPJ obstruction, congenital     Past Surgical History:  Procedure Laterality Date   APPENDECTOMY  1939   malignant melanoma removed from right forehead  2008   PERMANENT PACEMAKER INSERTION N/A 06/19/2013   Procedure: PERMANENT PACEMAKER INSERTION;  Surgeon: Evans Lance, MD;  Location: [facility] CATH LAB;  Service: Cardiovascular;  Laterality: N/A;   right big toe  surgery  2009   TRANSURETHRAL RESECTION OF PROSTATE  02/04/2011   Procedure: TRANSURETHRAL RESECTION OF THE PROSTATE (TURP);  Surgeon: Dutch Gray, MD;  Location: [facility] ORS;  Service: Urology;  Laterality: N/A;   URETHRA SURGERY  2010   reconstruction    There were no vitals filed for this visit.   Subjective Assessment - 12/01/20 1409     Subjective Pt denies any pain today. He feels Pnts.T has helped with his  coordination od UE and LE movements.    Pertinent History bipolar    Patient Stated Goals "better balance and coordination"    Currently in Pain? No/denies                               [facility] Adult PT Treatment/Exercise - 12/01/20 1402       Knee/Hip Exercises: Aerobic   Nustep L6 x 6 min (UE/LE to promote reciprocal movement)               [facility]) - 12/01/20 1402     PWR! exercises Moves in sitting;Moves in standing;Moves in supine;Moves in prone    PWR! Up x10    PWR! Rock x10    PWR! Twist x10    PWR! Step x10    PWR! Up x10    PWR! Rock x10    PWR! Twist x10    PWR! Step x10    PWR! Up x10    PWR! Rock x10    PWR! Twist x10    PWR Step x10    PWR! Up x10    PWR! Rock x10    PWR! Twist x10    PWR! Step x10  PT Education - 12/01/20 1439     Education Details Supine & Prone PWR! Moves    Person(s) Educated Patient    Methods Explanation;Demonstration;Verbal cues;Handout    Comprehension Verbalized understanding;Verbal cues required;Returned demonstration;Need further instruction              PT Short Term Goals - 11/17/20 1128       PT SHORT TERM GOAL #1   Title Patient will be independent with initial HEP    Status Achieved   11/17/20 (pt denies concerns with HEP)   Target Date 11/16/20               PT Long Term Goals - 11/23/20 0934       PT LONG TERM GOAL #1   Title Patient will be independent with ongoing/advanced HEP for self-management at home    Status On-going    Target Date 12/07/20      PT LONG TERM GOAL #2   Title Patient will improve 5x STS time to </= 15 seconds for improved efficiency and safety with transfers    Baseline 10/26/20 - 18.18 sec    Status Achieved   11/23/20 - 5xSTS = 14.69 sec     PT LONG TERM GOAL #3   Title Patient will improve Berg score to >/= 52/56 to improve safety and stability with ADLs in standing and reduce risk for falls    Baseline 10/26/20 -  47/56    Status On-going    Target Date 12/07/20      PT LONG TERM GOAL #4   Title Patient will improve FGA score to >/= 22/30 to improve gait stability and reduce risk for falls    Baseline 10/26/20 - 18/30    Status Achieved   11/23/20 - FGA = 25/30     PT LONG TERM GOAL #5   Title Patient will ambulate outdoor, unlevel surfaces, at least 1000 ft, independently, no LOB, for improved community gait    Status On-going    Target Date 12/07/20      PT LONG TERM GOAL #6   Title Patient will verbalize understanding of fall prevention in the home environment    Status On-going    Target Date 12/07/20      PT LONG TERM GOAL #7   Title Patient will verbalize understanding of local Parkinson's disease resources    Status On-going    Target Date 12/07/20                   Plan - 12/01/20 1410     Clinical Impression Statement Victor Osborne reports improving sense of coordination from having worked on the exercises and PWR! Moves and notes improved awareness of reciprocal and bilateral tasks. Reviewed seated and standing PWR! moves with pt able to quickly achieve good rhythm with moves. Introduced supine and prone PWR! moves with increased difficulty effort noted with prone moves as well as achieving prone position but able to complete all PWR! patterns in both positions, therefore HEP instructions provided. Will plan to introduce quadruped PWR! Moves as tolerated next visit. Victor Osborne is nearing the end of his current PT POC but has expressed interest in potentially extending PT visits with reduced frequency to further build on skills and movement patterns, therefore will consider recert at end of current episode.    Comorbidities PPM 2 1st degree heart block & SA node dysfunction, OSA, COPD, hypothyroidism, chronic PTSD, bipolar I disorder, decreased endurance since COVID-19 in ~March 2022  Rehab Potential Good    PT Frequency 2x / week    PT Duration 6 weeks    PT Treatment/Interventions  ADLs/Self Care Home Management;DME Instruction;Gait training;Stair training;Functional mobility training;Therapeutic activities;Therapeutic exercise;Balance training;Neuromuscular re-education;Patient/family education;Manual techniques    PT Next Visit Plan core/postural strengthening; PWR! Moves in sitting & standing, progressing to other positions as tolerated    PT Home Exercise Plan Access Code: [email or web address] (10/12); PWR! Moves - Seated (10/12), Standing & PWR! Walking routine (11/7), Supine & Prone PWR! Moves (11/15)    Consulted and Agree with Plan of Care Patient             Patient will benefit from skilled therapeutic intervention in order to improve the following deficits and impairments:  Abnormal gait, Decreased activity tolerance, Decreased balance, Decreased endurance, Decreased mobility, Decreased safety awareness, Difficulty walking, Impaired perceived functional ability, Impaired flexibility, Postural dysfunction  Visit Diagnosis: Other abnormalities of gait and mobility  Unsteadiness on feet  Abnormal posture     Problem List Patient Active Problem List   Diagnosis Date Noted   Chronic post-traumatic stress disorder 12/05/2017   Bipolar I disorder, current or most recent episode depressed, in partial remission ([place]) 12/05/2017   Hypothyroidism 10/11/2016   Parkinson disease ([place]) 09/08/2016   Pacemaker 09/24/2013   Sinoatrial node dysfunction ([place]) 06/19/2013   First degree AV block 04/09/2013   Allergic rhinitis 03/17/2013   Obstructive sleep apnea 12/18/2006   COPD mixed type ([place]) 12/18/2006   MELANOMA, HX OF 12/18/2006    Victor Osborne, PT 12/01/2020, 3:05 PM  [facility] [address]  [place], [place], [address] Phone: [phone]   Fax:  [phone]  Name: Victor Osborne MRN: [id number] Date of Birth: Nov 12, 1932

## 2020-12-03 ENCOUNTER — Other Ambulatory Visit: Payer: Self-pay

## 2020-12-03 ENCOUNTER — Encounter: Payer: Self-pay | Admitting: Psychiatry

## 2020-12-03 ENCOUNTER — Ambulatory Visit (INDEPENDENT_AMBULATORY_CARE_PROVIDER_SITE_OTHER): Payer: Medicare Other | Admitting: Psychiatry

## 2020-12-03 DIAGNOSIS — F4312 Post-traumatic stress disorder, chronic: Secondary | ICD-10-CM

## 2020-12-03 DIAGNOSIS — F3175 Bipolar disorder, in partial remission, most recent episode depressed: Secondary | ICD-10-CM

## 2020-12-03 MED ORDER — CARBAMAZEPINE 200 MG PO TABS: 200.0000 mg | ORAL_TABLET | Freq: Two times a day (BID) | ORAL | 1 refills | Status: DC

## 2020-12-03 MED ORDER — FLUOXETINE HCL 10 MG PO CAPS
10.0000 mg | ORAL_CAPSULE | Freq: Every day | ORAL | 1 refills | Status: DC
Start: 2020-12-03 — End: 2021-06-09

## 2020-12-03 MED ORDER — QUETIAPINE FUMARATE ER 50 MG PO TB24: 50.0000 mg | ORAL_TABLET | Freq: Every day | ORAL | 1 refills | Status: DC

## 2020-12-03 NOTE — Progress Notes (Signed)
Victor Osborne [id number] July 06, 1932 87 y.o.  Subjective:   Patient ID:  Victor Osborne is a 87 y.o. (DOB 08/05/1932) male.  Chief Complaint:  Chief Complaint  Patient presents with   Follow-up    Anxiety and Bipolar D/O     HPI Victor Osborne presents to the office today for follow-up of anxiety and Bipolar D/O. He reports, "ups and downs... more ups." He reports that he had a brief period where he felt more hyper. He reports that he occ has some mild depression. He reports that he has intrusive memories. Denies flashbacks. He reports that when he has anxious thoughts he will go do something else.   He reports that he ensures that he takes medications on time, ensures adequate sleep, and avoids people that he perceives to be negative. He reports that his sleep is adequate. Energy has been lower since COVID. Motivation has been good. He reports that his concentration has been adequate. Appetite has been good. He reports that his weight is at his baseline. Denies SI.   He reports that he occasionally increase his medications slightly when he notices a change in symptoms.   Girlfriend now lives with her daughter and he is no longer her caregiver. He continues to socialize frequently.   "I feel better right now than I have in awhile."   Past Psychiatric Medication Trials: Carbamazepine Lithium Prozac Remeron Seroquel XR Ambien  PHQ2-9    [place] Office Visit from 10/31/2017 in [place] at [facility] from 09/08/2016 in [place] at [facility] Total Score 3 0  PHQ-9 Total Score 9 --        Review of Systems:  Review of Systems  Musculoskeletal:        Ambulates with cane  Neurological:  Positive for tremors.  Psychiatric/Behavioral:         Please refer to HPI  He reports that RLS has improved with Iron.   Medications: I have reviewed the patient's current medications.  Current Outpatient Medications   Medication Sig Dispense Refill   acetaminophen (TYLENOL) 325 MG tablet Take 650 mg by mouth every 6 (six) hours as needed.     Bacillus Coagulans-Inulin (PROBIOTIC-PREBIOTIC) 1-250 BILLION-MG CAPS Take by mouth.     Bee Pollen 550 MG CAPS Take by mouth.     carbidopa-levodopa (SINEMET IR) 25-100 MG tablet Take 2 tablets by mouth 3 (three) times daily. 540 tablet 1   cholecalciferol (VITAMIN D) 1000 UNITS tablet Take 2,000 Units by mouth daily.     Coenzyme Q10 (CO Q 10 PO) Take by mouth.     ferrous sulfate 324 (65 Fe) MG TBEC Take 324 mg by mouth.     Flaxseed, Linseed, (FLAX SEED OIL) 1300 MG CAPS Take 1 capsule by mouth daily.     levothyroxine (LEVOTHROID) 125 MCG tablet Take 1 tablet (125 mcg total) by mouth daily before breakfast. 90 tablet 3   MAGNESIUM PO Take by mouth.     Multiple Vitamin (MULITIVITAMIN WITH MINERALS) TABS Take 1 tablet by mouth daily.     Omega-3 Fatty Acids (OMEGA-3 CF PO) Take 1,200 mg by mouth daily.     POTASSIUM GLUCONATE PO Take by mouth.     pravastatin (PRAVACHOL) 40 MG tablet Take 1 tablet (40 mg total) by mouth daily. 90 tablet 3   TURMERIC CURCUMIN PO Take by mouth.     alclomethasone (ACLOVATE) 0.05 % ointment Apply 1 application topically 2 (two) times daily  as needed (for rash).     carbamazepine (TEGRETOL) 200 MG tablet Take 1 tablet (200 mg total) by mouth 2 (two) times daily. 180 tablet 1   desoximetasone (TOPICORT) 0.25 % cream Apply 1 application topically 2 (two) times daily.     FLUoxetine (PROZAC) 10 MG capsule Take 1 capsule (10 mg total) by mouth daily. 90 capsule 1   QUEtiapine (SEROQUEL XR) 50 MG TB24 24 hr tablet Take 1 tablet (50 mg total) by mouth at bedtime. 90 tablet 1   No current facility-administered medications for this visit.    Medication Side Effects: None  Allergies: No Known Allergies  Past Medical History:  Diagnosis Date   Bipolar 1 disorder (HCC)    COPD (chronic obstructive pulmonary disease) (HCC)     Depression    First degree AV block    Hypertension    pt denies but was in medical record   Hypoglycemia, unspecified    Myalgia and myositis, unspecified    Obesity    Pneumonia 1970   Skin cancer (melanoma) ([place])    Thyroid disease    Unspecified hypothyroidism    UPJ obstruction, congenital     Past Medical History, Surgical history, Social history, and Family history were reviewed and updated as appropriate.   Please see review of systems for further details on the patient's review from today.   Objective:   Physical Exam:  There were no vitals taken for this visit.  Physical Exam Constitutional:      General: He is not in acute distress. Musculoskeletal:        General: No deformity.  Neurological:     Mental Status: He is alert and oriented to person, place, and time.     Coordination: Coordination normal.  Psychiatric:        Attention and Perception: Attention and perception normal. He does not perceive auditory or visual hallucinations.        Mood and Affect: Mood normal. Mood is not anxious or depressed. Affect is not labile, blunt, angry or inappropriate.        Speech: Speech normal.        Behavior: Behavior normal.        Thought Content: Thought content normal. Thought content is not paranoid or delusional. Thought content does not include homicidal or suicidal ideation. Thought content does not include homicidal or suicidal plan.        Cognition and Memory: Cognition and memory normal.        Judgment: Judgment normal.     Comments: Insight intact    Lab Review:     Component Value Date/Time   NA 142 08/14/2020 1112   NA 143 01/16/2019 0838   K 4.5 08/14/2020 1112   CL 107 08/14/2020 1112   CO2 26 08/14/2020 1112   GLUCOSE 91 08/14/2020 1112   BUN 26 (H) 08/14/2020 1112   BUN 15 01/16/2019 0838   CREATININE 1.18 08/14/2020 1112   CALCIUM 9.1 08/14/2020 1112   PROT 6.1 08/14/2020 1112   PROT 6.2 01/16/2019 0838   ALBUMIN 4.3 01/16/2019 0838    AST 15 08/14/2020 1112   ALT 11 08/14/2020 1112   ALKPHOS 110 01/16/2019 0838   BILITOT 0.5 08/14/2020 1112   BILITOT 0.3 01/16/2019 0838   GFRNONAA 53 (L) 01/16/2019 0838   GFRAA 61 01/16/2019 0838       Component Value Date/Time   WBC 4.8 08/14/2020 1112   RBC 4.33 08/14/2020 1112   HGB  13.9 08/14/2020 1112   HGB 13.4 01/16/2019 0838   HCT 42.7 08/14/2020 1112   HCT 41.1 01/16/2019 0838   PLT 171 08/14/2020 1112   PLT 188 01/16/2019 0838   MCV 98.6 08/14/2020 1112   MCV 97 01/16/2019 0838   MCH 32.1 08/14/2020 1112   MCHC 32.6 08/14/2020 1112   RDW 13.1 08/14/2020 1112   RDW 12.9 01/16/2019 0838   LYMPHSABS 782 (L) 08/14/2020 1112   LYMPHSABS 0.6 (L) 01/16/2019 0838   MONOABS 0.6 06/11/2013 1215   EOSABS 341 08/14/2020 1112   EOSABS 0.3 01/16/2019 0838   BASOSABS 62 08/14/2020 1112   BASOSABS 0.1 01/16/2019 0838    No results found for: POCLITH, LITHIUM   Lab Results  Component Value Date   CBMZ 4.8 08/14/2020     .res Assessment: Plan:   Pt seen for 30 minutes and time spent discussing and reviewing strategies to maintain stable mood.  Will continue current plan of care since target signs and symptoms are well controlled without any tolerability issues. Continue Seroquel XR 50 mg po q evening for mood s/s.  Continue Prozac 10 mg po qd for anxiety and depression.  Continue Carbamazepine 200 mg po BID for mood stabilization.  Pt to follow-up in 6 months or sooner if clinically indicated.   Patient advised to contact office with any questions, adverse effects, or acute worsening in signs and symptoms.   Victor Osborne was seen today for follow-up.  Diagnoses and all orders for this visit:  Bipolar I disorder, current or most recent episode depressed, in partial remission (HCC) -     carbamazepine (TEGRETOL) 200 MG tablet; Take 1 tablet (200 mg total) by mouth 2 (two) times daily. -     FLUoxetine (PROZAC) 10 MG capsule; Take 1 capsule (10 mg total) by mouth  daily. -     QUEtiapine (SEROQUEL XR) 50 MG TB24 24 hr tablet; Take 1 tablet (50 mg total) by mouth at bedtime.  Chronic post-traumatic stress disorder -     FLUoxetine (PROZAC) 10 MG capsule; Take 1 capsule (10 mg total) by mouth daily.    Please see After Visit Summary for patient specific instructions.  Future Appointments  Date Time Provider [place]  12/04/2020 11:00 AM Percival Spanish, PT [facility]  12/07/2020  3:00 PM Evans Lance, MD [facility]  12/08/2020  8:45 AM Percival Spanish, PT [facility]  12/15/2020  1:25 PM [facility] DEVICE REMOTES [facility]  01/12/2021 10:00 AM [name], FNP [facility]  03/16/2021  1:25 PM [facility] DEVICE REMOTES [facility]  04/13/2021  3:30 PM Tat, Eustace Quail, DO [facility] None  06/02/2021 11:00 AM Thayer Headings, PMHNP [facility] None  06/15/2021  1:25 PM [facility] DEVICE REMOTES [facility]  09/14/2021  1:25 PM [facility] DEVICE REMOTES [facility]  09/28/2021  9:30 AM Baird Lyons D, MD [facility] None  12/14/2021  1:25 PM [facility] DEVICE REMOTES [facility]    No orders of the defined types were placed in this encounter.   -------------------------------

## 2020-12-04 ENCOUNTER — Encounter: Payer: Self-pay | Admitting: Physical Therapy

## 2020-12-04 ENCOUNTER — Ambulatory Visit: Payer: Medicare Other | Admitting: Physical Therapy

## 2020-12-04 DIAGNOSIS — R293 Abnormal posture: Secondary | ICD-10-CM

## 2020-12-04 DIAGNOSIS — R2689 Other abnormalities of gait and mobility: Secondary | ICD-10-CM | POA: Diagnosis not present

## 2020-12-04 DIAGNOSIS — R2681 Unsteadiness on feet: Secondary | ICD-10-CM

## 2020-12-04 NOTE — Therapy (Signed)
[facility] [address]  [place], [place], [address] Phone: [phone]   Fax:  [phone]  Physical Therapy Treatment / Recert  Patient Details  Name: Victor Osborne MRN: [id number] Date of Birth: 1932/10/31 Referring Provider (PT): Alonza Bogus, DO  Progress Note  Reporting Period 10/26/2020 to 12/04/2020  See note below for Objective Data and Assessment of Progress/Goals.     Encounter Date: 12/04/2020   PT End of Session - 12/04/20 1029     Visit Number 9    Number of Visits 15    Date for PT Re-Evaluation 01/15/21    Authorization Type Medicare & [employer]    PT Start Time 1029    PT Stop Time 1111    PT Time Calculation (min) 42 min    Activity Tolerance Patient tolerated treatment well    Behavior During Therapy WFL for tasks assessed/performed             Past Medical History:  Diagnosis Date   Bipolar 1 disorder ([place])    COPD (chronic obstructive pulmonary disease) ([place])    Depression    First degree AV block    Hypertension    pt denies but was in medical record   Hypoglycemia, unspecified    Myalgia and myositis, unspecified    Obesity    Pneumonia 1970   Skin cancer (melanoma) ([place])    Thyroid disease    Unspecified hypothyroidism    UPJ obstruction, congenital     Past Surgical History:  Procedure Laterality Date   APPENDECTOMY  1939   malignant melanoma removed from right forehead  2008   PERMANENT PACEMAKER INSERTION N/A 06/19/2013   Procedure: PERMANENT PACEMAKER INSERTION;  Surgeon: Evans Lance, MD;  Location: [facility] CATH LAB;  Service: Cardiovascular;  Laterality: N/A;   right big toe  surgery  2009   TRANSURETHRAL RESECTION OF PROSTATE  02/04/2011   Procedure: TRANSURETHRAL RESECTION OF THE PROSTATE (TURP);  Surgeon: Dutch Gray, MD;  Location: [facility]S;  Service: Urology;  Laterality: N/A;   URETHRA SURGERY  2010   reconstruction    There were no vitals  filed for this visit.   Subjective Assessment - 12/04/20 1031     Subjective Pt denies any pain today. States he felt good after trying the new PWR! Moves last visit but reports her feels fatigued today.    Pertinent History bipolar    Patient Stated Goals "better balance and coordination"    Currently in Pain? No/denies                [facility] PT Assessment - 12/04/20 1029       Assessment   Medical Diagnosis Parkinson's disease    Referring Provider (PT) Alonza Bogus, DO    Onset Date/Surgical Date 10/14/20   MD visit; PD diagnosis ~15 yrs ago   Next MD Visit 04/13/21      Prior Function   Level of Independence Independent   goes out for most meals   Vocation Retired    Leisure being outdoors - hiking/walking daily x 30 min      Strength   Overall Strength Within functional limits for tasks performed      Ambulation/Gait   Ambulation/Gait Assistance 5: Supervision;7: Independent    Gait Pattern Step-through pattern;Decreased arm swing - left;Decreased arm swing - right;Decreased trunk rotation;Trunk flexed    Gait velocity 4.20 ft/sec      [employer]  Sit to Stand Able to stand without using hands and stabilize independently    Standing Unsupported Able to stand safely 2 minutes    Sitting with Back Unsupported but Feet Supported on Floor or Stool Able to sit safely and securely 2 minutes    Stand to Sit Sits safely with minimal use of hands    Transfers Able to transfer safely, minor use of hands    Standing Unsupported with Eyes Closed Able to stand 10 seconds safely    Standing Unsupported with Feet Together Able to place feet together independently and stand 1 minute safely    From Standing, Reach Forward with Outstretched Arm Can reach confidently >25 cm (10")    From Standing Position, Pick up Object from Floor Able to pick up shoe safely and easily    From Standing Position, Turn to Look Behind Over each Shoulder Looks behind one side only/other side shows  less weight shift    Turn 360 Degrees Able to turn 360 degrees safely but slowly    Standing Unsupported, Alternately Place Feet on Step/Stool Able to stand independently and safely and complete 8 steps in 20 seconds    Standing Unsupported, One Foot in Front Able to plae foot ahead of the other independently and hold 30 seconds    Standing on One Leg Able to lift leg independently and hold equal to or more than 3 seconds    Total Score 50    Berg comment: Scores 46-51 indicate moderate fall risk (>50%)      Functional Gait  Assessment   Gait assessed  --   testing as of 11/23/20   Gait Level Surface Walks 20 ft in less than 5.5 sec, no assistive devices, good speed, no evidence for imbalance, normal gait pattern, deviates no more than 6 in outside of the 12 in walkway width.    Change in Gait Speed Able to smoothly change walking speed without loss of balance or gait deviation. Deviate no more than 6 in outside of the 12 in walkway width.    Gait with Horizontal Head Turns Performs head turns smoothly with no change in gait. Deviates no more than 6 in outside 12 in walkway width    Gait with Vertical Head Turns Performs head turns with no change in gait. Deviates no more than 6 in outside 12 in walkway width.    Gait and Pivot Turn Pivot turns safely within 3 sec and stops quickly with no loss of balance.    Step Over Obstacle Is able to step over one shoe box (4.5 in total height) without changing gait speed. No evidence of imbalance.    Gait with Narrow Base of Support Ambulates 4-7 steps.    Gait with Eyes Closed Walks 20 ft, no assistive devices, good speed, no evidence of imbalance, normal gait pattern, deviates no more than 6 in outside 12 in walkway width. Ambulates 20 ft in less than 7 sec.    Ambulating Backwards Walks 20 ft, uses assistive device, slower speed, mild gait deviations, deviates 6-10 in outside 12 in walkway width.    Steps Alternating feet, must use rail.    Total Score 25     FGA comment: Scores 25-28 = low risk fall                           [facility] Adult PT Treatment/Exercise - 12/04/20 1029       Knee/Hip Exercises: Aerobic  Nustep L6 x 6 min (UE/LE to promote reciprocal movement)                     PT Education - 12/04/20 1110     Education Details All 4's/Quadruped PWR! Moves    Person(s) Educated Patient    Methods Explanation;Demonstration;Verbal cues;Handout    Comprehension Verbalized understanding;Verbal cues required;Returned demonstration;Need further instruction              PT Short Term Goals - 11/17/20 1128       PT SHORT TERM GOAL #1   Title Patient will be independent with initial HEP    Status Achieved   11/17/20 (pt denies concerns with HEP)   Target Date 11/16/20               PT Long Term Goals - 12/04/20 1035       PT LONG TERM GOAL #1   Title Patient will be independent with ongoing/advanced HEP for self-management at home    Status Partially Met   12/04/20 - Met for stretches and strengthening program but pt feels that he needs additional review with PWR! Moves to improve coordination   Target Date 01/15/21      PT LONG TERM GOAL #2   Title Patient will improve 5x STS time to </= 15 seconds for improved efficiency and safety with transfers    Baseline 10/26/20 - 18.18 sec; 11/23/20 - 14.69 sec    Status Achieved   11/23/20     PT LONG TERM GOAL #3   Title Patient will improve Berg score to >/= 52/56 to improve safety and stability with ADLs in standing and reduce risk for falls    Baseline 10/26/20 - 47/56; 12/04/20 - 50/56    Status On-going    Target Date 01/15/21      PT LONG TERM GOAL #4   Title Patient will improve FGA score to >/= 22/30 to improve gait stability and reduce risk for falls    Baseline 10/26/20 - 18/30; 11/23/20 - 25/30    Status Achieved   11/23/20     PT LONG TERM GOAL #5   Title Patient will ambulate outdoor, unlevel surfaces, at least 1000 ft,  independently, no LOB, for improved community gait    Status Partially Met   12/04/20 - Pt notes uneasiness/apprehension with changing between surfaces outdoors   Target Date 01/15/21      PT LONG TERM GOAL #6   Title Patient will verbalize understanding of fall prevention in the home environment    Status On-going    Target Date 01/15/21      PT LONG TERM GOAL #7   Title Patient will verbalize understanding of local Parkinson's disease resources    Status Partially Met   12/04/20 - Pt has been added to [employer] [facility] Parkinson's email list   Target Date 01/15/21                   Plan - 12/04/20 1034     Clinical Impression [place] has demonstrated good progress with PT, demonstrating gains on all standardized testing - decreased 5xSTS to 14.69 sec and TUG times to 9.15 sec for normal TUG & 10.00 sec for cognitive TUG, as well as improved FGA score to 25/30 and Berg score to 50/56 - LTGs #2 & 4 now met but LTG #3 ongoing. His gait speed has improved to 4.20 ft/sec but he still feels he tends toward forward flexed  posture and struggles to properly coordinate reciprocal arm swing. He also notes hesitancy and unsteadiness with surface transitions when ambulating outdoors - LTG #5 partially met. He feels comfortable with HEP stretches and strengthening exercises and we have completed initial training for PWR! Moves in all positions, but he feels he needs additional practice and coaching in several positions to achieve better coordination of movement patterns - LTG #1 partially met. He will also benefit from additional training and practice with carryover of PWR! Moves into functional daily movement and tasks. As such will recommend recert for continued PT 1/wk x 4-6 weeks to address remaining deficits and concerns.    Personal Factors and Comorbidities Age;Comorbidity 3+;Past/Current Experience;Social Background;Time since onset of injury/illness/exacerbation    Comorbidities PPM 2 1st  degree heart block & SA node dysfunction, OSA, COPD, hypothyroidism, chronic PTSD, bipolar I disorder, decreased endurance since COVID-19 in ~March 2022    Examination-Activity Limitations Caring for Others;Transfers;Stand;Locomotion Level;Stairs;Squat    Examination-Participation Restrictions Community Activity;Interpersonal Relationship    Rehab Potential Good    PT Frequency 1x / week    PT Duration 6 weeks   4-6 weeks   PT Treatment/Interventions ADLs/Self Care Home Management;DME Instruction;Gait training;Stair training;Functional mobility training;Therapeutic activities;Therapeutic exercise;Balance training;Neuromuscular re-education;Patient/family education;Manual techniques;Energy conservation    PT Next Visit Plan core/postural strengthening; PWR! Moves in sitting & standing, progressing to other positions as tolerated    PT Home Exercise Plan Access Code: [email or web address] (10/12); PWR! Moves - Seated (10/12), Standing & PWR! Walking routine (11/7), Supine & Prone (11/15); All 4's/Quadruped (11/18)    Consulted and Agree with Plan of Care Patient             Patient will benefit from skilled therapeutic intervention in order to improve the following deficits and impairments:  Abnormal gait, Decreased activity tolerance, Decreased balance, Decreased endurance, Decreased mobility, Decreased safety awareness, Difficulty walking, Impaired perceived functional ability, Impaired flexibility, Postural dysfunction  Visit Diagnosis: Other abnormalities of gait and mobility  Unsteadiness on feet  Abnormal posture     Problem List Patient Active Problem List   Diagnosis Date Noted   Chronic post-traumatic stress disorder 12/05/2017   Bipolar I disorder, current or most recent episode depressed, in partial remission ([place]) 12/05/2017   Hypothyroidism 10/11/2016   Parkinson disease ([place]) 09/08/2016   Pacemaker 09/24/2013   Sinoatrial node dysfunction ([place]) 06/19/2013   First degree AV block  04/09/2013   Allergic rhinitis 03/17/2013   Obstructive sleep apnea 12/18/2006   COPD mixed type ([place]) 12/18/2006   MELANOMA, HX OF 12/18/2006    Percival Spanish, PT 12/04/2020, 11:52 AM  [facility] [address]  [place], [place], [address] Phone: [phone]   Fax:  [phone]  Name: Victor Osborne MRN: [id number] Date of Birth: [birth year]

## 2020-12-07 ENCOUNTER — Encounter: Payer: Self-pay | Admitting: Internal Medicine

## 2020-12-07 ENCOUNTER — Other Ambulatory Visit: Payer: Self-pay

## 2020-12-07 ENCOUNTER — Ambulatory Visit (INDEPENDENT_AMBULATORY_CARE_PROVIDER_SITE_OTHER): Payer: Medicare Other | Admitting: Internal Medicine

## 2020-12-07 VITALS — BP 118/64 | HR 60 | Ht 76.0 in | Wt 203.0 lb

## 2020-12-07 DIAGNOSIS — I495 Sick sinus syndrome: Secondary | ICD-10-CM | POA: Diagnosis not present

## 2020-12-07 DIAGNOSIS — Z950 Presence of cardiac pacemaker: Secondary | ICD-10-CM

## 2020-12-07 NOTE — Progress Notes (Signed)
HPI Mr. Shew returns today for ongoing evaluation and management of his permanent pacemaker status post insertion over 7 years ago. The patient is a very pleasant 87 year old man who has symptomatic sinus node dysfunction. He has done well in the interim. He denies chest pain or shortness of breath. No syncope. No peripheral edema. No Known Allergies   Current Outpatient Medications  Medication Sig Dispense Refill   acetaminophen (TYLENOL) 325 MG tablet Take 650 mg by mouth every 6 (six) hours as needed.     alclomethasone (ACLOVATE) 0.05 % ointment Apply 1 application topically 2 (two) times daily as needed (for rash).     Bacillus Coagulans-Inulin (PROBIOTIC-PREBIOTIC) 1-250 BILLION-MG CAPS Take by mouth.     Bee Pollen 550 MG CAPS Take by mouth.     carbamazepine (TEGRETOL) 200 MG tablet Take 1 tablet (200 mg total) by mouth 2 (two) times daily. 180 tablet 1   carbidopa-levodopa (SINEMET IR) 25-100 MG tablet Take 2 tablets by mouth 3 (three) times daily. 540 tablet 1   cholecalciferol (VITAMIN D) 1000 UNITS tablet Take 2,000 Units by mouth daily.     Coenzyme Q10 (CO Q 10 PO) Take by mouth.     desoximetasone (TOPICORT) 0.25 % cream Apply 1 application topically 2 (two) times daily.     ferrous sulfate 324 (65 Fe) MG TBEC Take 324 mg by mouth.     Flaxseed, Linseed, (FLAX SEED OIL) 1300 MG CAPS Take 1 capsule by mouth daily.     FLUoxetine (PROZAC) 10 MG capsule Take 1 capsule (10 mg total) by mouth daily. 90 capsule 1   levothyroxine (LEVOTHROID) 125 MCG tablet Take 1 tablet (125 mcg total) by mouth daily before breakfast. 90 tablet 3   MAGNESIUM PO Take by mouth.     Multiple Vitamin (MULITIVITAMIN WITH MINERALS) TABS Take 1 tablet by mouth daily.     Omega-3 Fatty Acids (OMEGA-3 CF PO) Take 1,200 mg by mouth daily.     POTASSIUM GLUCONATE PO Take by mouth.     pravastatin (PRAVACHOL) 40 MG tablet Take 1 tablet (40 mg total) by mouth daily. 90 tablet 3   QUEtiapine (SEROQUEL  XR) 50 MG TB24 24 hr tablet Take 1 tablet (50 mg total) by mouth at bedtime. 90 tablet 1   TURMERIC CURCUMIN PO Take by mouth.     No current facility-administered medications for this visit.     Past Medical History:  Diagnosis Date   Bipolar 1 disorder ([place])    COPD (chronic obstructive pulmonary disease) ([facility])    Depression    First degree AV block    Hypertension    pt denies but was in medical record   Hypoglycemia, unspecified    Myalgia and myositis, unspecified    Obesity    Pneumonia 1970   Skin cancer (melanoma) ([place])    Thyroid disease    Unspecified hypothyroidism    UPJ obstruction, congenital     ROS:   All systems reviewed and negative except as noted in the HPI.   Past Surgical History:  Procedure Laterality Date   APPENDECTOMY  1939   malignant melanoma removed from right forehead  2008   PERMANENT PACEMAKER INSERTION N/A 06/19/2013   Procedure: PERMANENT PACEMAKER INSERTION;  Surgeon: Evans Lance, MD;  Location: [facility] CATH LAB;  Service: Cardiovascular;  Laterality: N/A;   right big toe  surgery  2009   TRANSURETHRAL RESECTION OF PROSTATE  02/04/2011   Procedure: TRANSURETHRAL RESECTION  OF THE PROSTATE (TURP);  Surgeon: Dutch Gray, MD;  Location: [facility];  Service: Urology;  Laterality: N/A;   URETHRA SURGERY  2010   reconstruction     Family History  Problem Relation Age of Onset   Cerebral aneurysm Mother    Lung cancer Father    Drug abuse Daughter      Social History   Socioeconomic History   Marital status: Widowed    Spouse name: Not on file   Number of children: Not on file   Years of education: Not on file   Highest education level: Not on file  Occupational History   Occupation: retired  Tobacco Use   Smoking status: Former    Packs/day: 2.00    Years: 40.00    Pack years: 80.00    Types: Cigarettes   Smokeless tobacco: Never  Vaping Use   Vaping Use: Never used  Substance and Sexual Activity   Alcohol use: No   Drug use:  No   Sexual activity: Not Currently  Other Topics Concern   Not on file  Social History Narrative   Work or School: retire Engineer, structural      Home Situation: lives with wife      Spiritual Beliefs: episcopalian      Lifestyle: active with yard work; diet ok   Social Determinants of Radio broadcast assistant Strain: Not on file  Food Insecurity: Not on file  Transportation Needs: Not on file  Physical Activity: Not on file  Stress: Not on file  Social Connections: Not on file  Intimate Partner Violence: Not on file     BP 118/64   Pulse 60   Ht 6\' 4"  (1.93 m)   Wt 203 lb (92.1 kg)   SpO2 97%   BMI 24.71 kg/m   Physical Exam:  Well appearing NAD HEENT: Unremarkable Neck:  No JVD, no thyromegally Lymphatics:  No adenopathy Back:  No CVA tenderness Lungs:  Clear with no wheezes HEART:  Regular rate rhythm, no murmurs, no rubs, no clicks Abd:  soft, positive bowel sounds, no organomegally, no rebound, no guarding Ext:  2 plus pulses, no edema, no cyanosis, no clubbing Skin:  No rashes no nodules Neuro:  CN II through XII intact, motor grossly intact  EKG - nsr with first degree AV block  DEVICE  Normal device function.  See PaceArt for details.   Assess/Plan:  1. CHB/sinus node dysfunction - he has both and is pacing much of the time. He is asymptomatic. 2. PPM -his [employer] DDD PM is working normally. 3. HTN - his bp is well controlled.  4. Dyslipidemia - he will continue his statin therapy with pravachol.   Carleene Overlie Jaleesa Cervi,MD

## 2020-12-07 NOTE — Patient Instructions (Addendum)
Medication Instructions:  Your physician recommends that you continue on your current medications as directed. Please refer to the Current Medication list given to you today.  Labwork: None ordered.  Testing/Procedures: None ordered.  Follow-Up: Your physician wants you to follow-up in: one year with Legrand Como "Jonni Sanger" Chalmers Cater, PA-C  Remote monitoring is used to monitor your Pacemaker from home. This monitoring reduces the number of office visits required to check your device to one time per year. It allows [place] to keep an eye on the functioning of your device to ensure it is working properly. You are scheduled for a device check from home on 12/15/2020. You may send your transmission at any time that day. If you have a wireless device, the transmission will be sent automatically. After your physician reviews your transmission, you will receive a postcard with your next transmission date.  Any Other Special Instructions Will Be Listed Below (If Applicable).  If you need a refill on your cardiac medications before your next appointment, please call your pharmacy.

## 2020-12-08 ENCOUNTER — Encounter: Payer: Self-pay | Admitting: Physical Therapy

## 2020-12-08 ENCOUNTER — Other Ambulatory Visit (HOSPITAL_BASED_OUTPATIENT_CLINIC_OR_DEPARTMENT_OTHER): Payer: Self-pay

## 2020-12-08 ENCOUNTER — Ambulatory Visit: Payer: Medicare Other | Admitting: Physical Therapy

## 2020-12-08 DIAGNOSIS — R2681 Unsteadiness on feet: Secondary | ICD-10-CM

## 2020-12-08 DIAGNOSIS — R2689 Other abnormalities of gait and mobility: Secondary | ICD-10-CM

## 2020-12-08 DIAGNOSIS — R293 Abnormal posture: Secondary | ICD-10-CM

## 2020-12-08 MED ORDER — PFIZER COVID-19 VAC BIVALENT 30 MCG/0.3ML IM SUSP
INTRAMUSCULAR | 0 refills | Status: DC
  Filled 2020-12-08: qty 0.3, 1d supply, fill #0

## 2020-12-08 NOTE — Therapy (Signed)
[facility] [address]  [place], [place], [address] Phone: [phone]   Fax:  [phone]  Physical Therapy Treatment  Patient Details  Name: Victor Osborne MRN: [id number] Date of Birth: 1932-11-08 Referring Provider (PT): Alonza Bogus, DO   Encounter Date: 12/08/2020   PT End of Session - 12/08/20 0844     Visit Number 10    Number of Visits 15    Date for PT Re-Evaluation 01/15/21    Authorization Type Medicare & [employer]    PT Start Time [id number]    PT Stop Time 0928    PT Time Calculation (min) 44 min    Activity Tolerance Patient tolerated treatment well    Behavior During Therapy [facility] for tasks assessed/performed             Past Medical History:  Diagnosis Date   Bipolar 1 disorder ([place])    COPD (chronic obstructive pulmonary disease) ([place])    Depression    First degree AV block    Hypertension    pt denies but was in medical record   Hypoglycemia, unspecified    Myalgia and myositis, unspecified    Obesity    Pneumonia 1970   Skin cancer (melanoma) ([place])    Thyroid disease    Unspecified hypothyroidism    UPJ obstruction, congenital     Past Surgical History:  Procedure Laterality Date   APPENDECTOMY  1939   malignant melanoma removed from right forehead  2008   PERMANENT PACEMAKER INSERTION N/A 06/19/2013   Procedure: PERMANENT PACEMAKER INSERTION;  Surgeon: Evans Lance, MD;  Location: [facility] CATH LAB;  Service: Cardiovascular;  Laterality: N/A;   right big toe  surgery  2009   TRANSURETHRAL RESECTION OF PROSTATE  02/04/2011   Procedure: TRANSURETHRAL RESECTION OF THE PROSTATE (TURP);  Surgeon: Dutch Gray, MD;  Location: [facility];  Service: Urology;  Laterality: N/A;   URETHRA SURGERY  2010   reconstruction    There were no vitals filed for this visit.   Subjective Assessment - 12/08/20 0847     Subjective Pt reports he feels weak today. Spent the day at the [facility] yesterday  and is worn out.    Pertinent History bipolar    Patient Stated Goals "better balance and coordination"    Currently in Pain? No/denies                               [facility] Adult PT Treatment/Exercise - 12/08/20 0844       Bed Mobility   Bed Mobility Rolling Right;Rolling Left    Rolling Right Supervision/verbal cueing    Rolling Right Details (indicate cue type and reason) cues for use of PWR! Twist to facilitate momentum to initiate rolling    Rolling Left Supervision/Verbal cueing      Lumbar Exercises: Seated   Sit to Stand 10 reps    Sit to Stand Limitations 3# weight held at arms length to promote forward momentum      Lumbar Exercises: Supine   Bent Knee Raise 10 reps;3 seconds    Bent Knee Raise Limitations red TB psoas march      Knee/Hip Exercises: Aerobic   Nustep L6 x 6 min (UE/LE to promote reciprocal movement)      Knee/Hip Exercises: Seated   Marching Right;Left;10 reps    Marching Limitations looped red TB at knees  Sit to Sand 10 reps;without UE support   arm swing with fwd rock to create momentum              [facility]) - 12/08/20 0844     PWR! Sit to Stand x10                  PT Education - 12/08/20 0929     Education Details HEP update - Access Code: [email or web address]    Person(s) Educated Patient    Methods Explanation;Demonstration;Verbal cues;Handout    Comprehension Verbalized understanding;Verbal cues required;Returned demonstration;Need further instruction              PT Short Term Goals - 11/17/20 1128       PT SHORT TERM GOAL #1   Title Patient will be independent with initial HEP    Status Achieved   11/17/20 (pt denies concerns with HEP)   Target Date 11/16/20               PT Long Term Goals - 12/04/20 1035       PT LONG TERM GOAL #1   Title Patient will be independent with ongoing/advanced HEP for self-management at home    Status Partially Met   12/04/20 - Met for stretches and  strengthening program but pt feels that he needs additional review with PWR! Moves to improve coordination   Target Date 01/15/21      PT LONG TERM GOAL #2   Title Patient will improve 5x STS time to </= 15 seconds for improved efficiency and safety with transfers    Baseline 10/26/20 - 18.18 sec; 11/23/20 - 14.69 sec    Status Achieved   11/23/20     PT LONG TERM GOAL #3   Title Patient will improve Berg score to >/= 52/56 to improve safety and stability with ADLs in standing and reduce risk for falls    Baseline 10/26/20 - 47/56; 12/04/20 - 50/56    Status On-going    Target Date 01/15/21      PT LONG TERM GOAL #4   Title Patient will improve FGA score to >/= 22/30 to improve gait stability and reduce risk for falls    Baseline 10/26/20 - 18/30; 11/23/20 - 25/30    Status Achieved   11/23/20     PT LONG TERM GOAL #5   Title Patient will ambulate outdoor, unlevel surfaces, at least 1000 ft, independently, no LOB, for improved community gait    Status Partially Met   12/04/20 - Pt notes uneasiness/apprehension with changing between surfaces outdoors   Target Date 01/15/21      PT LONG TERM GOAL #6   Title Patient will verbalize understanding of fall prevention in the home environment    Status On-going    Target Date 01/15/21      PT LONG TERM GOAL #7   Title Patient will verbalize understanding of local Parkinson's disease resources    Status Partially Met   12/04/20 - Pt has been added to [employer] email list   Target Date 01/15/21                   Plan - 12/08/20 0851     Clinical Impression Statement Therapy session focusing on functional movement patterns to address areas of difficulty identified by Victor Osborne. Utilized PWR! Twist to facilitate rolling from side to side in bed. Multiple strategies introduced to facilitate sit to stand transition, including use of PWR! Up as  well arm swing with fwd rock/reach. Victor Osborne also noting difficulty with lifting legs to don  his pants, therefore targeting hip flexor strengthening in both supine and sitting with red TB resistance. HEP update for relevant exercises provided at pt request.    Comorbidities PPM 2 1st degree heart block & SA node dysfunction, OSA, COPD, hypothyroidism, chronic PTSD, bipolar I disorder, decreased endurance since COVID-19 in ~March 2022    Rehab Potential Good    PT Frequency 1x / week    PT Duration 6 weeks   4-6 weeks   PT Treatment/Interventions ADLs/Self Care Home Management;DME Instruction;Gait training;Stair training;Functional mobility training;Therapeutic activities;Therapeutic exercise;Balance training;Neuromuscular re-education;Patient/family education;Manual techniques;Energy conservation    PT Next Visit Plan core/postural strengthening; PWR! Moves in sitting & standing, progressing to other positions as tolerated    PT Home Exercise Plan Access Code: [email or web address] (10/12, update 11/22); PWR! Moves - Seated (10/12), Standing & PWR! Walking routine (11/7), Supine & Prone (11/15); All 4's/Quadruped (11/18)    Consulted and Agree with Plan of Care Patient             Patient will benefit from skilled therapeutic intervention in order to improve the following deficits and impairments:  Abnormal gait, Decreased activity tolerance, Decreased balance, Decreased endurance, Decreased mobility, Decreased safety awareness, Difficulty walking, Impaired perceived functional ability, Impaired flexibility, Postural dysfunction  Visit Diagnosis: Other abnormalities of gait and mobility  Unsteadiness on feet  Abnormal posture     Problem List Patient Active Problem List   Diagnosis Date Noted   Chronic post-traumatic stress disorder 12/05/2017   Bipolar I disorder, current or most recent episode depressed, in partial remission ([place]) 12/05/2017   Hypothyroidism 10/11/2016   Parkinson disease ([place]) 09/08/2016   Pacemaker 09/24/2013   Sinoatrial node dysfunction ([place]) 06/19/2013    First degree AV block 04/09/2013   Allergic rhinitis 03/17/2013   Obstructive sleep apnea 12/18/2006   COPD mixed type ([place]) 12/18/2006   MELANOMA, HX OF 12/18/2006    Percival Spanish, PT 12/08/2020, 11:00 AM  [facility] [address]  [place], [place], [address] Phone: [phone]   Fax:  [phone]  Name: Victor Osborne MRN: [id number] Date of Birth: 1932-01-18

## 2020-12-08 NOTE — Patient Instructions (Signed)
    Access Code: [email or web address] URL: [email or web address] Date: 12/08/2020 Prepared by: Annie Paras  Exercises Seated Hamstring Stretch with Strap - 2 x daily - 7 x weekly - 3 reps - 30 sec hold Seated Figure 4 Piriformis Stretch - 2 x daily - 7 x weekly - 3 reps - 30 sec hold Seated Hip Flexor Stretch - 2 x daily - 7 x weekly - 3 reps - 30 sec hold Standing Hip Flexor Stretch - 2 x daily - 7 x weekly - 2 sets - 10 reps - 30 sec hold Supine Hip Flexion with Resistance Loop - 1 x daily - 7 x weekly - 2 sets - 10 reps - 3 sec hold Seated March with Resistance - 1 x daily - 7 x weekly - 2 sets - 10 reps - 3 sec hold Sit to Stand Without Arm Support - 1 x daily - 7 x weekly - 2 sets - 10 reps Sit to Stand with Arm Swing - 1 x daily - 7 x weekly - 2 sets - 10 reps

## 2020-12-15 ENCOUNTER — Ambulatory Visit (INDEPENDENT_AMBULATORY_CARE_PROVIDER_SITE_OTHER): Payer: Medicare Other

## 2020-12-15 DIAGNOSIS — I495 Sick sinus syndrome: Secondary | ICD-10-CM

## 2020-12-15 DIAGNOSIS — Z950 Presence of cardiac pacemaker: Secondary | ICD-10-CM | POA: Diagnosis not present

## 2020-12-15 LAB — CUP PACEART REMOTE DEVICE CHECK
Battery Remaining Longevity: 54 mo
Battery Remaining Percentage: 62 %
Brady Statistic RA Percent Paced: 77 %
Brady Statistic RV Percent Paced: 59 %
Date Time Interrogation Session: 20221129004700
Implantable Lead Implant Date: 20150603
Implantable Lead Implant Date: 20150603
Implantable Lead Location: 753859
Implantable Lead Location: 753860
Implantable Lead Model: 4136
Implantable Lead Model: 4137
Implantable Lead Serial Number: 29477746
Implantable Lead Serial Number: 29617326
Implantable Pulse Generator Implant Date: 20150603
Lead Channel Impedance Value: 561 Ohm
Lead Channel Impedance Value: 775 Ohm
Lead Channel Pacing Threshold Amplitude: 0.6 V
Lead Channel Pacing Threshold Amplitude: 1.5 V
Lead Channel Pacing Threshold Pulse Width: 0.4 ms
Lead Channel Pacing Threshold Pulse Width: 0.5 ms
Lead Channel Setting Pacing Amplitude: 1.9 V
Lead Channel Setting Pacing Amplitude: 2 V
Lead Channel Setting Pacing Pulse Width: 0.4 ms
Lead Channel Setting Sensing Sensitivity: 2.5 mV
Pulse Gen Serial Number: 389736

## 2020-12-16 ENCOUNTER — Encounter: Payer: Self-pay | Admitting: Physical Therapy

## 2020-12-16 ENCOUNTER — Ambulatory Visit: Payer: Medicare Other | Admitting: Physical Therapy

## 2020-12-16 ENCOUNTER — Other Ambulatory Visit: Payer: Self-pay

## 2020-12-16 DIAGNOSIS — R2681 Unsteadiness on feet: Secondary | ICD-10-CM

## 2020-12-16 DIAGNOSIS — R2689 Other abnormalities of gait and mobility: Secondary | ICD-10-CM

## 2020-12-16 DIAGNOSIS — R293 Abnormal posture: Secondary | ICD-10-CM

## 2020-12-16 NOTE — Therapy (Signed)
[facility] [address]  [place], [place], [address] Phone: [phone]   Fax:  [phone]  Physical Therapy Treatment  Patient Details  Name: Victor Osborne MRN: [id number] Date of Birth: May 12, 1932 Referring Provider (PT): Alonza Bogus, DO   Encounter Date: 12/16/2020   PT End of Session - 12/16/20 1148     Visit Number 11    Number of Visits 15    Date for PT Re-Evaluation 01/15/21    Authorization Type Medicare & [employer]    PT Start Time 0762    PT Stop Time 1230    PT Time Calculation (min) 42 min    Activity Tolerance Patient tolerated treatment well    Behavior During Therapy [facility] for tasks assessed/performed             Past Medical History:  Diagnosis Date   Bipolar 1 disorder ([place])    COPD (chronic obstructive pulmonary disease) ([place])    Depression    First degree AV block    Hypertension    pt denies but was in medical record   Hypoglycemia, unspecified    Myalgia and myositis, unspecified    Obesity    Pneumonia 1970   Skin cancer (melanoma) ([place])    Thyroid disease    Unspecified hypothyroidism    UPJ obstruction, congenital     Past Surgical History:  Procedure Laterality Date   APPENDECTOMY  1939   malignant melanoma removed from right forehead  2008   PERMANENT PACEMAKER INSERTION N/A 06/19/2013   Procedure: PERMANENT PACEMAKER INSERTION;  Surgeon: Evans Lance, MD;  Location: [facility] CATH LAB;  Service: Cardiovascular;  Laterality: N/A;   right big toe  surgery  2009   TRANSURETHRAL RESECTION OF PROSTATE  02/04/2011   Procedure: TRANSURETHRAL RESECTION OF THE PROSTATE (TURP);  Surgeon: Dutch Gray, MD;  Location: [facility];  Service: Urology;  Laterality: N/A;   URETHRA SURGERY  2010   reconstruction    There were no vitals filed for this visit.   Subjective Assessment - 12/16/20 1155     Subjective Pt reports he feels like he is going downhill energy wise.     Pertinent History bipolar    Patient Stated Goals "better balance and coordination"    Currently in Pain? No/denies                               [facility] Adult PT Treatment/Exercise - 12/16/20 1148       High Level Balance   High Level Balance Activities Side stepping;Braiding;Backward walking;Tandem walking;Marching forwards;Marching turns   Toe walking & Heel walking   High Level Balance Comments most activities 2 x 10 ft along counter with only occasional UE support on counter      Knee/Hip Exercises: Aerobic   Nustep L6 x 6 min (UE/LE to promote reciprocal movement)               [facility]) - 12/16/20 1148     Comments PWR! Walk + High Step x ~250 ft in hallway              Balance Exercises - 12/16/20 1148       Balance Exercises: Standing   Standing Eyes Opened Narrow base of support (BOS);Solid surface;Foam/compliant surface;30 secs;2 reps;Head turns;5 reps   arms crossed on chest   Standing Eyes Closed Narrow base of support (BOS);2  reps;10 secs;Solid surface;Foam/compliant surface;Head turns;5 reps   arms crossed on chest   Tandem Gait Forward;Retro;4 reps;Foam/compliant surface;Intermittent upper extremity support;2 reps    Partial Tandem Stance Eyes open;Eyes closed;Foam/compliant surface   firm surface & Airex pad - corner balance progression with arms crossed on chest + head turns/tilts; only static stance with eyes closed on Airex pad   Sidestepping 4 reps;Foam/compliant support;2 reps                  PT Short Term Goals - 11/17/20 1128       PT SHORT TERM GOAL #1   Title Patient will be independent with initial HEP    Status Achieved   11/17/20 (pt denies concerns with HEP)   Target Date 11/16/20               PT Long Term Goals - 12/04/20 1035       PT LONG TERM GOAL #1   Title Patient will be independent with ongoing/advanced HEP for self-management at home    Status Partially Met   12/04/20 - Met for  stretches and strengthening program but pt feels that he needs additional review with PWR! Moves to improve coordination   Target Date 01/15/21      PT LONG TERM GOAL #2   Title Patient will improve 5x STS time to </= 15 seconds for improved efficiency and safety with transfers    Baseline 10/26/20 - 18.18 sec; 11/23/20 - 14.69 sec    Status Achieved   11/23/20     PT LONG TERM GOAL #3   Title Patient will improve Berg score to >/= 52/56 to improve safety and stability with ADLs in standing and reduce risk for falls    Baseline 10/26/20 - 47/56; 12/04/20 - 50/56    Status On-going    Target Date 01/15/21      PT LONG TERM GOAL #4   Title Patient will improve FGA score to >/= 22/30 to improve gait stability and reduce risk for falls    Baseline 10/26/20 - 18/30; 11/23/20 - 25/30    Status Achieved   11/23/20     PT LONG TERM GOAL #5   Title Patient will ambulate outdoor, unlevel surfaces, at least 1000 ft, independently, no LOB, for improved community gait    Status Partially Met   12/04/20 - Pt notes uneasiness/apprehension with changing between surfaces outdoors   Target Date 01/15/21      PT LONG TERM GOAL #6   Title Patient will verbalize understanding of fall prevention in the home environment    Status On-going    Target Date 01/15/21      PT LONG TERM GOAL #7   Title Patient will verbalize understanding of local Parkinson's disease resources    Status Partially Met   12/04/20 - Pt has been added to [employer] email list   Target Date 01/15/21                   Plan - 12/16/20 1230     Clinical Impression Statement Victor Osborne reports continued concerns regarding his stability and balance with static and dynamic standing, therefore today's session focusing on static and dynamic standing balance and stepping activities. Corner balance progression completed with narrow BOS and partial tandem stance on firm and compliant surfaces with eyes open and closed - good  tolerance and ability to self-correct with only occasional reaches for wall but unable to progress beyond static partial tandem stance  on Airex pad with eyes closed. Only rare UE support necessary with dynamic stepping and gait activities with greatest perceived difficulty noted with tandem gait and side stepping on Airex balance beam. High stepping incorporated into PWR! Walk with some deviation outside of straight path noted, most commonly to the right, and pt continuing to note increased concentration necessary to initiate reciprocal arm swing pattern.    Comorbidities PPM 2 1st degree heart block & SA node dysfunction, OSA, COPD, hypothyroidism, chronic PTSD, bipolar I disorder, decreased endurance since COVID-19 in ~March 2022    Rehab Potential Good    PT Frequency 1x / week    PT Duration 6 weeks   4-6 weeks   PT Treatment/Interventions ADLs/Self Care Home Management;DME Instruction;Gait training;Stair training;Functional mobility training;Therapeutic activities;Therapeutic exercise;Balance training;Neuromuscular re-education;Patient/family education;Manual techniques;Energy conservation    PT Next Visit Plan static and dynamic balance and stepping patterns; core/postural strengthening; functional movement patterns incorportating PWR! Moves review as needed    PT Home Exercise Plan Access Code: [email or web address] (10/12, update 11/22); PWR! Moves - Seated (10/12), Standing & PWR! Walking routine (11/7), Supine & Prone (11/15); All 4's/Quadruped (11/18)    Consulted and Agree with Plan of Care Patient             Patient will benefit from skilled therapeutic intervention in order to improve the following deficits and impairments:  Abnormal gait, Decreased activity tolerance, Decreased balance, Decreased endurance, Decreased mobility, Decreased safety awareness, Difficulty walking, Impaired perceived functional ability, Impaired flexibility, Postural dysfunction  Visit Diagnosis: Other abnormalities  of gait and mobility  Unsteadiness on feet  Abnormal posture     Problem List Patient Active Problem List   Diagnosis Date Noted   Chronic post-traumatic stress disorder 12/05/2017   Bipolar I disorder, current or most recent episode depressed, in partial remission ([place]) 12/05/2017   Hypothyroidism 10/11/2016   Parkinson disease ([place]) 09/08/2016   Pacemaker 09/24/2013   Sinoatrial node dysfunction ([place]) 06/19/2013   First degree AV block 04/09/2013   Allergic rhinitis 03/17/2013   Obstructive sleep apnea 12/18/2006   COPD mixed type ([place]) 12/18/2006   MELANOMA, HX OF 12/18/2006    Percival Spanish, PT 12/16/2020, 1:03 PM  [facility] [address]  [place], [place], [address] Phone: [phone]   Fax:  [phone]  Name: Victor Osborne MRN: [id number] Date of Birth: 09-Aug-1932

## 2020-12-23 ENCOUNTER — Other Ambulatory Visit: Payer: Self-pay

## 2020-12-23 ENCOUNTER — Encounter: Payer: Self-pay | Admitting: Physical Therapy

## 2020-12-23 ENCOUNTER — Ambulatory Visit: Payer: Medicare Other | Attending: Neurology | Admitting: Physical Therapy

## 2020-12-23 DIAGNOSIS — R2689 Other abnormalities of gait and mobility: Secondary | ICD-10-CM | POA: Diagnosis not present

## 2020-12-23 DIAGNOSIS — R2681 Unsteadiness on feet: Secondary | ICD-10-CM | POA: Diagnosis present

## 2020-12-23 DIAGNOSIS — R293 Abnormal posture: Secondary | ICD-10-CM | POA: Diagnosis present

## 2020-12-23 NOTE — Therapy (Signed)
[facility] [address]  [place], [place], [address] Phone: [phone]   Fax:  [phone]  Physical Therapy Treatment  Patient Details  Name: Victor Osborne MRN: [id number] Date of Birth: 14-Jan-1933 Referring Provider (PT): Alonza Bogus, DO   Encounter Date: 12/23/2020   PT End of Session - 12/23/20 1147     Visit Number 12    Number of Visits 15    Date for PT Re-Evaluation 01/15/21    Authorization Type Medicare & [employer]    PT Start Time 1147    PT Stop Time 1228    PT Time Calculation (min) 41 min    Activity Tolerance Patient tolerated treatment well    Behavior During Therapy [facility] for tasks assessed/performed             Past Medical History:  Diagnosis Date   Bipolar 1 disorder ([place])    COPD (chronic obstructive pulmonary disease) ([place])    Depression    First degree AV block    Hypertension    pt denies but was in medical record   Hypoglycemia, unspecified    Myalgia and myositis, unspecified    Obesity    Pneumonia 1970   Skin cancer (melanoma) ([place])    Thyroid disease    Unspecified hypothyroidism    UPJ obstruction, congenital     Past Surgical History:  Procedure Laterality Date   APPENDECTOMY  1939   malignant melanoma removed from right forehead  2008   PERMANENT PACEMAKER INSERTION N/A 06/19/2013   Procedure: PERMANENT PACEMAKER INSERTION;  Surgeon: Evans Lance, MD;  Location: [facility] CATH LAB;  Service: Cardiovascular;  Laterality: N/A;   right big toe  surgery  2009   TRANSURETHRAL RESECTION OF PROSTATE  02/04/2011   Procedure: TRANSURETHRAL RESECTION OF THE PROSTATE (TURP);  Surgeon: Dutch Gray, MD;  Location: [facility];  Service: Urology;  Laterality: N/A;   URETHRA SURGERY  2010   reconstruction    There were no vitals filed for this visit.   Subjective Assessment - 12/23/20 1152     Subjective Pt reports he did not sleep well last night due to his bipolar  making him feel more hyper. He notes he has been working on his reciprocal gait but still finds he will revert to more od shuffle if he loses concentration.    Pertinent History bipolar    Patient Stated Goals "better balance and coordination"    Currently in Pain? Yes    Pain Score 4    3-4/10   Pain Location Knee    Pain Orientation Left    Pain Descriptors / Indicators Sharp    Pain Type Acute pain    Pain Frequency Intermittent                               [facility] Adult PT Treatment/Exercise - 12/23/20 1147       Knee/Hip Exercises: Aerobic   Nustep L5 x 6 min (UE/LE to promote reciprocal movement)               [facility]) - 12/23/20 1147     PWR! exercises Functional moves;Multi-directional movements with dual tasks    PWR! Rock and [employer]! Step & turn - 90 & 180 turns; PWR! Rock + High step & turn for turns in tight spaces - 90, 180 & 360 turns  PWR! Multi-Directional Movements with Dual Tasks PWR! Step fwd with cross-body reach to cones - initially with wide BOS, progressing to disc for central target, then fwd step onto Airex pad for unstable surface   gait belt used for last pattern but pt able to self-correct all LOB                   PT Short Term Goals - 11/17/20 1128       PT SHORT TERM GOAL #1   Title Patient will be independent with initial HEP    Status Achieved   11/17/20 (pt denies concerns with HEP)   Target Date 11/16/20               PT Long Term Goals - 12/23/20 1249       PT LONG TERM GOAL #1   Title Patient will be independent with ongoing/advanced HEP for self-management at home    Status Partially Met   12/04/20 - Met for stretches and strengthening program but pt feels that he needs additional review with PWR! Moves to improve coordination   Target Date 01/15/21      PT LONG TERM GOAL #2   Title Patient will improve 5x STS time to </= 15 seconds for improved efficiency and safety with  transfers    Baseline 10/26/20 - 18.18 sec; 11/23/20 - 14.69 sec    Status Achieved   11/23/20     PT LONG TERM GOAL #3   Title Patient will improve Berg score to >/= 52/56 to improve safety and stability with ADLs in standing and reduce risk for falls    Baseline 10/26/20 - 47/56; 12/04/20 - 50/56    Status On-going    Target Date 01/15/21      PT LONG TERM GOAL #4   Title Patient will improve FGA score to >/= 22/30 to improve gait stability and reduce risk for falls    Baseline 10/26/20 - 18/30; 11/23/20 - 25/30    Status Achieved   11/23/20     PT LONG TERM GOAL #5   Title Patient will ambulate outdoor, unlevel surfaces, at least 1000 ft, independently, no LOB, for improved community gait    Status Partially Met   12/04/20 - Pt notes uneasiness/apprehension with changing between surfaces outdoors   Target Date 01/15/21      PT LONG TERM GOAL #6   Title Patient will verbalize understanding of fall prevention in the home environment    Status Achieved   12/23/20   Target Date 01/15/21      PT LONG TERM GOAL #7   Title Patient will verbalize understanding of local Parkinson's disease resources    Status Partially Met   12/04/20 - Pt has been added to [employer] email list   Target Date 01/15/21                   Plan - 12/23/20 1156     Clinical Impression [place] continues to feel that he struggles with initiation and coordination of reciprocal movement pattens especially when walking. Utilized PWR! Step/Walk patterns adding cross body reaching to increased trunk rotation with reciprocal pattern, initially with wide BOS, progressing to disc for central target, then fwd step onto Airex pad for unstable surface to add balance challenge - pt tends to want to lead with his R foot regardless of L vs R reach but was able to self-correct ~50% of time with missteps. Also worked on incorporation  of PWR! Rock and Step patterns to facilitate increased ease of turning  with cues often necessary to increased height of step with trailing leg to avoid dragging it across the floor.    Comorbidities PPM 2 1st degree heart block & SA node dysfunction, OSA, COPD, hypothyroidism, chronic PTSD, bipolar I disorder, decreased endurance since COVID-19 in ~March 2022    Rehab Potential Good    PT Frequency 1x / week    PT Duration 6 weeks   4-6 weeks   PT Treatment/Interventions ADLs/Self Care Home Management;DME Instruction;Gait training;Stair training;Functional mobility training;Therapeutic activities;Therapeutic exercise;Balance training;Neuromuscular re-education;Patient/family education;Manual techniques;Energy conservation    PT Next Visit Plan static and dynamic balance and stepping patterns; core/postural strengthening; functional movement patterns incorportating PWR! Moves review as needed    PT Home Exercise Plan Access Code: [email or web address] (10/12, update 11/22); PWR! Moves - Seated (10/12), Standing & PWR! Walking routine (11/7), Supine & Prone (11/15); All 4's/Quadruped (11/18)    Consulted and Agree with Plan of Care Patient             Patient will benefit from skilled therapeutic intervention in order to improve the following deficits and impairments:  Abnormal gait, Decreased activity tolerance, Decreased balance, Decreased endurance, Decreased mobility, Decreased safety awareness, Difficulty walking, Impaired perceived functional ability, Impaired flexibility, Postural dysfunction  Visit Diagnosis: Other abnormalities of gait and mobility  Unsteadiness on feet  Abnormal posture     Problem List Patient Active Problem List   Diagnosis Date Noted   Chronic post-traumatic stress disorder 12/05/2017   Bipolar I disorder, current or most recent episode depressed, in partial remission ([place]) 12/05/2017   Hypothyroidism 10/11/2016   Parkinson disease ([place]) 09/08/2016   Pacemaker 09/24/2013   Sinoatrial node dysfunction ([place]) 06/19/2013   First degree  AV block 04/09/2013   Allergic rhinitis 03/17/2013   Obstructive sleep apnea 12/18/2006   COPD mixed type ([place]) 12/18/2006   MELANOMA, HX OF 12/18/2006    Percival Spanish, PT 12/23/2020, 1:06 PM  [facility] [address]  [place], [place], [address] Phone: [phone]   Fax:  [phone]  Name: Victor Osborne MRN: [id number] Date of Birth: 07/25/1932

## 2020-12-24 NOTE — Progress Notes (Signed)
Remote pacemaker transmission.   

## 2020-12-30 ENCOUNTER — Other Ambulatory Visit: Payer: Self-pay

## 2020-12-30 ENCOUNTER — Encounter: Payer: Self-pay | Admitting: Physical Therapy

## 2020-12-30 ENCOUNTER — Ambulatory Visit: Payer: Medicare Other | Admitting: Physical Therapy

## 2020-12-30 ENCOUNTER — Ambulatory Visit: Payer: Medicare Other | Admitting: Psychiatry

## 2020-12-30 DIAGNOSIS — R2689 Other abnormalities of gait and mobility: Secondary | ICD-10-CM | POA: Diagnosis not present

## 2020-12-30 DIAGNOSIS — R2681 Unsteadiness on feet: Secondary | ICD-10-CM

## 2020-12-30 DIAGNOSIS — R293 Abnormal posture: Secondary | ICD-10-CM

## 2020-12-30 NOTE — Therapy (Signed)
[facility] [address]  [place], [place], [address] Phone: [phone]   Fax:  [phone]  Physical Therapy Treatment  Patient Details  Name: Victor Osborne MRN: [id number] Date of Birth: February 19, 1932 Referring Provider (PT): Alonza Bogus, DO   Encounter Date: 12/30/2020   PT End of Session - 12/30/20 1142     Visit Number 13    Number of Visits 15    Date for PT Re-Evaluation 01/15/21    Authorization Type Medicare & [employer]    PT Start Time 1142    PT Stop Time 1229    PT Time Calculation (min) 47 min    Activity Tolerance Patient tolerated treatment well    Behavior During Therapy [facility] for tasks assessed/performed             Past Medical History:  Diagnosis Date   Bipolar 1 disorder ([place])    COPD (chronic obstructive pulmonary disease) ([place])    Depression    First degree AV block    Hypertension    pt denies but was in medical record   Hypoglycemia, unspecified    Myalgia and myositis, unspecified    Obesity    Pneumonia 1970   Skin cancer (melanoma) ([place])    Thyroid disease    Unspecified hypothyroidism    UPJ obstruction, congenital     Past Surgical History:  Procedure Laterality Date   APPENDECTOMY  1939   malignant melanoma removed from right forehead  2008   PERMANENT PACEMAKER INSERTION N/A 06/19/2013   Procedure: PERMANENT PACEMAKER INSERTION;  Surgeon: Evans Lance, MD;  Location: [facility] CATH LAB;  Service: Cardiovascular;  Laterality: N/A;   right big toe  surgery  2009   TRANSURETHRAL RESECTION OF PROSTATE  02/04/2011   Procedure: TRANSURETHRAL RESECTION OF THE PROSTATE (TURP);  Surgeon: Dutch Gray, MD;  Location: [facility];  Service: Urology;  Laterality: N/A;   URETHRA SURGERY  2010   reconstruction    There were no vitals filed for this visit.   Subjective Assessment - 12/30/20 1145     Subjective Pt reports his knee is feeling better today.    Pertinent History  bipolar    Patient Stated Goals "better balance and coordination"    Currently in Pain? No/denies                               [facility] Adult PT Treatment/Exercise - 12/30/20 1143       High Level Balance   High Level Balance Activities Side stepping;Direction changes;Sudden stops;Head turns;Weight-shifting turns;Marching turns    High Level Balance Comments dynamic gait activities in hallway including above and changes in gait speed      Knee/Hip Exercises: Aerobic   Nustep L6 x 6 min (UE/LE to promote reciprocal movement)               [facility]) - 12/30/20 1143     PWR! Rock and [employer]! Step & turn - 90 & 180 turns; PWR! Rock + High step & turn for turns in tight spaces - 90, 180 & 360 turns    PWR! Multi-Directional Movements with Dual Tasks PWR! Step fwd with cross-body reach to cones - initially with wide BOS, progressing to disc for central target, then fwd step onto Airex pad for unstable surface  Balance Exercises - 12/30/20 1143       Balance Exercises: Standing   Standing Eyes Opened Wide (BOA);Narrow base of support (BOS);Foam/compliant surface;20 secs;2 reps;Head turns;5 reps   arms crossed on chest   Standing Eyes Closed Narrow base of support (BOS);2 reps;10 secs;Foam/compliant surface   arms crossed on chest                 PT Short Term Goals - 11/17/20 1128       PT SHORT TERM GOAL #1   Title Patient will be independent with initial HEP    Status Achieved   11/17/20 (pt denies concerns with HEP)   Target Date 11/16/20               PT Long Term Goals - 12/23/20 1249       PT LONG TERM GOAL #1   Title Patient will be independent with ongoing/advanced HEP for self-management at home    Status Partially Met   12/04/20 - Met for stretches and strengthening program but pt feels that he needs additional review with PWR! Moves to improve coordination   Target Date 01/15/21      PT LONG TERM  GOAL #2   Title Patient will improve 5x STS time to </= 15 seconds for improved efficiency and safety with transfers    Baseline 10/26/20 - 18.18 sec; 11/23/20 - 14.69 sec    Status Achieved   11/23/20     PT LONG TERM GOAL #3   Title Patient will improve Berg score to >/= 52/56 to improve safety and stability with ADLs in standing and reduce risk for falls    Baseline 10/26/20 - 47/56; 12/04/20 - 50/56    Status On-going    Target Date 01/15/21      PT LONG TERM GOAL #4   Title Patient will improve FGA score to >/= 22/30 to improve gait stability and reduce risk for falls    Baseline 10/26/20 - 18/30; 11/23/20 - 25/30    Status Achieved   11/23/20     PT LONG TERM GOAL #5   Title Patient will ambulate outdoor, unlevel surfaces, at least 1000 ft, independently, no LOB, for improved community gait    Status Partially Met   12/04/20 - Pt notes uneasiness/apprehension with changing between surfaces outdoors   Target Date 01/15/21      PT LONG TERM GOAL #6   Title Patient will verbalize understanding of fall prevention in the home environment    Status Achieved   12/23/20   Target Date 01/15/21      PT LONG TERM GOAL #7   Title Patient will verbalize understanding of local Parkinson's disease resources    Status Partially Met   12/04/20 - Pt has been added to [employer] email list   Target Date 01/15/21                   Plan - 12/30/20 1147     Clinical Impression Statement Bill requesting to review corner balance and dynamic stepping patterns introduced in recent visits, including PWR! Step and Turn patterns as he feels he needs more practice to help get the movements ingrained in his head. Also targeted dynamic gait activities, especially with varying gait speed as pt noting more instability when he needs to walk slowly. As session ending, pt mentioning possibility of obtaining a rollator to use on late evening walks with is dog so that he would have a place  to  rest while his dog relieves herself - will plan to review rollator safety next visit including proper height for good upright posture with gait, proper gait pattern with rollator as well as safety with use of rollator brakes during transfers, especially when using rollator seat for rest break.    Comorbidities PPM 2 1st degree heart block & SA node dysfunction, OSA, COPD, hypothyroidism, chronic PTSD, bipolar I disorder, decreased endurance since COVID-19 in ~March 2022    Rehab Potential Good    PT Frequency 1x / week    PT Duration 6 weeks   4-6 weeks   PT Treatment/Interventions ADLs/Self Care Home Management;DME Instruction;Gait training;Stair training;Functional mobility training;Therapeutic activities;Therapeutic exercise;Balance training;Neuromuscular re-education;Patient/family education;Manual techniques;Energy conservation    PT Next Visit Plan Review of rollator safety per pt request (pt considering getting a rollator for late evening walks with is dog so that he would have a place to rest PRN); static and dynamic balance and stepping patterns; core/postural strengthening; functional movement patterns incorportating PWR! Moves review as needed    PT Home Exercise Plan Access Code: [email or web address] (10/12, update 11/22); PWR! Moves - Seated (10/12), Standing & PWR! Walking routine (11/7), Supine & Prone (11/15); All 4's/Quadruped (11/18)    Consulted and Agree with Plan of Care Patient             Patient will benefit from skilled therapeutic intervention in order to improve the following deficits and impairments:  Abnormal gait, Decreased activity tolerance, Decreased balance, Decreased endurance, Decreased mobility, Decreased safety awareness, Difficulty walking, Impaired perceived functional ability, Impaired flexibility, Postural dysfunction  Visit Diagnosis: Other abnormalities of gait and mobility  Unsteadiness on feet  Abnormal posture     Problem List Patient Active Problem  List   Diagnosis Date Noted   Chronic post-traumatic stress disorder 12/05/2017   Bipolar I disorder, current or most recent episode depressed, in partial remission ([place]) 12/05/2017   Hypothyroidism 10/11/2016   Parkinson disease ([place]) 09/08/2016   Pacemaker 09/24/2013   Sinoatrial node dysfunction ([place]) 06/19/2013   First degree AV block 04/09/2013   Allergic rhinitis 03/17/2013   Obstructive sleep apnea 12/18/2006   COPD mixed type ([place]) 12/18/2006   MELANOMA, HX OF 12/18/2006    Percival Spanish, PT 12/30/2020, 12:45 PM  [facility] [address]  [place], [place], [address] Phone: [phone]   Fax:  [phone]  Name: Victor Osborne MRN: [id number] Date of Birth: 02-12-1932

## 2021-01-05 ENCOUNTER — Ambulatory Visit: Payer: Medicare Other | Admitting: Physical Therapy

## 2021-01-05 ENCOUNTER — Ambulatory Visit: Payer: Medicare Other | Admitting: Psychiatry

## 2021-01-05 ENCOUNTER — Other Ambulatory Visit: Payer: Self-pay

## 2021-01-05 ENCOUNTER — Encounter: Payer: Self-pay | Admitting: Physical Therapy

## 2021-01-05 DIAGNOSIS — R2689 Other abnormalities of gait and mobility: Secondary | ICD-10-CM | POA: Diagnosis not present

## 2021-01-05 DIAGNOSIS — R2681 Unsteadiness on feet: Secondary | ICD-10-CM

## 2021-01-05 DIAGNOSIS — R293 Abnormal posture: Secondary | ICD-10-CM

## 2021-01-05 NOTE — Therapy (Signed)
[place] °[facility] [place] °[address]  [address] °[place], [place], [address] °Phone: [phone]   Fax:  [phone] ° °Physical Therapy Treatment ° °Patient Details  °Name: Victor Osborne °MRN: [id number] °Date of Birth: 10/29/1932 °Referring Provider (PT): Rebecca Tat, DO ° ° °Encounter Date: 01/05/2021 ° ° PT End of Session - 01/05/21 1057   ° ° Visit Number 14   ° Number of Visits 15   ° Date for PT Re-Evaluation 01/15/21   ° Authorization Type Medicare & [employer]   ° PT Start Time 1057   ° PT Stop Time 1145   ° PT Time Calculation (min) 48 min   ° Activity Tolerance Patient tolerated treatment well   ° Behavior During Therapy WFL for tasks assessed/performed   ° °  °  ° °  ° ° °Past Medical History:  °Diagnosis Date  ° Bipolar 1 disorder (HCC)   ° COPD (chronic obstructive pulmonary disease) (HCC)   ° Depression   ° First degree AV block   ° Hypertension   ° pt denies but was in medical record  ° Hypoglycemia, unspecified   ° Myalgia and myositis, unspecified   ° Obesity   ° Pneumonia 1970  ° Skin cancer (melanoma) (HCC)   ° Thyroid disease   ° Unspecified hypothyroidism   ° UPJ obstruction, congenital   ° ° °Past Surgical History:  °Procedure Laterality Date  ° APPENDECTOMY  1939  ° malignant melanoma removed from right forehead  2008  ° PERMANENT PACEMAKER INSERTION N/A 06/19/2013  ° Procedure: PERMANENT PACEMAKER INSERTION;  Surgeon: Gregg W Taylor, MD;  Location: [facility];  Service: Cardiovascular;  Laterality: N/A;  ° right big toe  surgery  2009  ° TRANSURETHRAL RESECTION OF PROSTATE  02/04/2011  ° Procedure: TRANSURETHRAL RESECTION OF THE PROSTATE (TURP);  Surgeon: Les Borden, MD;  Location: [facility];  Service: Urology;  Laterality: N/A;  ° URETHRA SURGERY  2010  ° reconstruction  ° ° °There were no vitals filed for this visit. ° ° Subjective Assessment - 01/05/21 1101   ° ° Subjective Pt reports he quit taking the Tylenol ER and the pain has not  returned w/o pain meds. He notes he feels like his fatigue is increasing despite sleeping 8-9 hrs per night.   ° Pertinent History bipolar   ° Patient Stated Goals "better balance and coordination"   ° Currently in Pain? No/denies   ° °  °  ° °  ° ° ° ° ° ° ° ° ° ° ° ° ° ° ° ° ° ° ° ° [facility] Adult PT Treatment/Exercise - 01/05/21 1057   ° °  ° Transfers  ° Transfers Sit to Stand;Stand to Sit   ° Sit to Stand 6: Modified independent (Device/Increase time);5: Supervision   ° Sit to Stand Details Verbal cues for safe use of DME/AE;Verbal cues for technique   ° Sit to Stand Details (indicate cue type and reason) cues to push up from seat rather than pull up on rollator   ° Stand to Sit 5: Supervision;6: Modified independent (Device/Increase time)   ° Stand to Sit Details (indicate cue type and reason) Verbal cues for safe use of DME/AE;Verbal cues for technique   ° Stand to Sit Details cues to lock rollator brakes then release handles and reach back for seat   °  ° Ambulation/Gait  ° Ambulation/Gait Assistance 5: Supervision;6: Modified independent (Device/Increase time)   °   Ambulation/Gait Assistance Details cues for rollator safety including upright posture and good rollator proximity aiming for foot placement btw rear wheels of rollator   ° Ambulation Distance (Feet) 540 Feet   ° Assistive device Rollator   ° Gait Pattern Step-through pattern   ° Ambulation Surface Level;Indoor   ° Gait Comments provided education in safe approach to seat/chair with rollator as well as safe use of rollator seat including use of rollator brakes for reast breaks while walking   °  ° Knee/Hip Exercises: Aerobic  ° Nustep L6 x 6 min (UE/LE to promote reciprocal movement)   ° °  °  ° °  ° ° ° ° ° ° ° ° ° ° ° ° PT Short Term Goals - 11/17/20 1128   ° °  ° PT SHORT TERM GOAL #1  ° Title Patient will be independent with initial HEP   ° Status Achieved   11/17/20 (pt denies concerns with HEP)  ° Target Date 11/16/20   ° °  °  ° °  ° ° ° ° PT  Long Term Goals - 12/23/20 1249   ° °  ° PT LONG TERM GOAL #1  ° Title Patient will be independent with ongoing/advanced HEP for self-management at home   ° Status Partially Met   12/04/20 - Met for stretches and strengthening program but pt feels that he needs additional review with PWR! Moves to improve coordination  ° Target Date 01/15/21   °  ° PT LONG TERM GOAL #2  ° Title Patient will improve 5x STS time to </= 15 seconds for improved efficiency and safety with transfers   ° Baseline 10/26/20 - 18.18 sec; 11/23/20 - 14.69 sec   ° Status Achieved   11/23/20  °  ° PT LONG TERM GOAL #3  ° Title Patient will improve Berg score to >/= 52/56 to improve safety and stability with ADLs in standing and reduce risk for falls   ° Baseline 10/26/20 - 47/56; 12/04/20 - 50/56   ° Status On-going   ° Target Date 01/15/21   °  ° PT LONG TERM GOAL #4  ° Title Patient will improve FGA score to >/= 22/30 to improve gait stability and reduce risk for falls   ° Baseline 10/26/20 - 18/30; 11/23/20 - 25/30   ° Status Achieved   11/23/20  °  ° PT LONG TERM GOAL #5  ° Title Patient will ambulate outdoor, unlevel surfaces, at least 1000 ft, independently, no LOB, for improved community gait   ° Status Partially Met   12/04/20 - Pt notes uneasiness/apprehension with changing between surfaces outdoors  ° Target Date 01/15/21   °  ° PT LONG TERM GOAL #6  ° Title Patient will verbalize understanding of fall prevention in the home environment   ° Status Achieved   12/23/20  ° Target Date 01/15/21   °  ° PT LONG TERM GOAL #7  ° Title Patient will verbalize understanding of local Parkinson's disease resources   ° Status Partially Met   12/04/20 - Pt has been added to [facility] email list  ° Target Date 01/15/21   ° °  °  ° °  ° ° ° ° ° ° ° ° Plan - 01/05/21 1104   ° ° Clinical Impression Statement Victor Osborne reports has ceased taking any kind of pain meds (has been taking Tylenol out of habit) and remains pain free. He reports he tried  looking online at rollators   but requested PT input regarding what he should be looking for. Instructed pt in height adjustment for rollator with handle height determined to need to be at least 40” in height to allow for proper upright posture. Provided education in safe use of rollator during transfers and gait, highlighting proper use of brakes and hand placement during transfers, posture and rollator proximity during gait and safe use of rollator seat for rest breaks while walking. Guided pt in what features to look for while shopping online for rollator. Victor Osborne verbalizing good understanding of rollator training as well as recommended specs when purchasing a rollator. Victor Osborne reports good comfort with HEP and PWR! Moves and is in agreement with plan to transition to his HEP upon completion of final goal assessment next visit.   ° Comorbidities PPM 2° 1st degree heart block & SA node dysfunction, OSA, COPD, hypothyroidism, chronic PTSD, bipolar I disorder, decreased endurance since COVID-19 in ~March 2022   ° Rehab Potential Good   ° PT Frequency 1x / week   ° PT Duration 6 weeks   4-6 weeks  ° PT Treatment/Interventions ADLs/Self Care Home Management;DME Instruction;Gait training;Stair training;Functional mobility training;Therapeutic activities;Therapeutic exercise;Balance training;Neuromuscular re-education;Patient/family education;Manual techniques;Energy conservation   ° PT Next Visit Plan goal assessment with anticipated discharge with transition to HEP   ° PT Home Exercise Plan Access Code: [email or web address] (10/12, update 11/22); PWR! Moves - Seated (10/12), Standing & PWR! Walking routine (11/7), Supine & Prone (11/15); All 4's/Quadruped (11/18)   ° Consulted and Agree with Plan of Care Patient   ° °  °  ° °  ° ° °Patient will benefit from skilled therapeutic intervention in order to improve the following deficits and impairments:  Abnormal gait, Decreased activity tolerance, Decreased balance, Decreased endurance,  Decreased mobility, Decreased safety awareness, Difficulty walking, Impaired perceived functional ability, Impaired flexibility, Postural dysfunction ° °Visit Diagnosis: °Other abnormalities of gait and mobility ° °Unsteadiness on feet ° °Abnormal posture ° ° ° ° °Problem List °Patient Active Problem List  ° Diagnosis Date Noted  ° Chronic post-traumatic stress disorder 12/05/2017  ° Bipolar I disorder, current or most recent episode depressed, in partial remission (HCC) 12/05/2017  ° Hypothyroidism 10/11/2016  ° Parkinson disease (HCC) 09/08/2016  ° Pacemaker 09/24/2013  ° Sinoatrial node dysfunction (HCC) 06/19/2013  ° First degree AV block 04/09/2013  ° Allergic rhinitis 03/17/2013  ° Obstructive sleep apnea 12/18/2006  ° COPD mixed type (HCC) 12/18/2006  ° MELANOMA, HX OF 12/18/2006  ° ° ° M , PT °01/05/2021, 12:11 PM ° °[place] °[facility] [place] °[address]  [address] °[place], [place], [address] °Phone: [phone]   Fax:  [phone] ° °Name: Victor Osborne °MRN: [id number] °Date of Birth: 12/17/1932 ° ° ° °

## 2021-01-12 ENCOUNTER — Encounter: Payer: Self-pay | Admitting: Family

## 2021-01-12 ENCOUNTER — Ambulatory Visit (INDEPENDENT_AMBULATORY_CARE_PROVIDER_SITE_OTHER): Payer: Medicare Other | Admitting: Family

## 2021-01-12 VITALS — BP 122/66 | HR 68 | Temp 97.3°F | Ht 76.0 in | Wt 200.2 lb

## 2021-01-12 DIAGNOSIS — I440 Atrioventricular block, first degree: Secondary | ICD-10-CM

## 2021-01-12 DIAGNOSIS — E785 Hyperlipidemia, unspecified: Secondary | ICD-10-CM | POA: Diagnosis not present

## 2021-01-12 DIAGNOSIS — E039 Hypothyroidism, unspecified: Secondary | ICD-10-CM

## 2021-01-12 DIAGNOSIS — G20 Parkinson's disease: Secondary | ICD-10-CM | POA: Diagnosis not present

## 2021-01-12 DIAGNOSIS — F3175 Bipolar disorder, in partial remission, most recent episode depressed: Secondary | ICD-10-CM

## 2021-01-12 DIAGNOSIS — G20A1 Parkinson's disease without dyskinesia, without mention of fluctuations: Secondary | ICD-10-CM

## 2021-01-12 DIAGNOSIS — Z950 Presence of cardiac pacemaker: Secondary | ICD-10-CM

## 2021-01-12 DIAGNOSIS — Z85820 Personal history of malignant melanoma of skin: Secondary | ICD-10-CM

## 2021-01-12 DIAGNOSIS — J449 Chronic obstructive pulmonary disease, unspecified: Secondary | ICD-10-CM

## 2021-01-12 LAB — COMPREHENSIVE METABOLIC PANEL
ALT: 4 U/L (ref 0–53)
AST: 16 U/L (ref 0–37)
Albumin: 4.1 g/dL (ref 3.5–5.2)
Alkaline Phosphatase: 62 U/L (ref 39–117)
BUN: 19 mg/dL (ref 6–23)
CO2: 27 mEq/L (ref 19–32)
Calcium: 9.1 mg/dL (ref 8.4–10.5)
Chloride: 107 mEq/L (ref 96–112)
Creatinine, Ser: 1.2 mg/dL (ref 0.40–1.50)
GFR: 54.88 mL/min — ABNORMAL LOW (ref >60.00)
Glucose, Bld: 97 mg/dL (ref 70–99)
Potassium: 4.3 mEq/L (ref 3.5–5.1)
Sodium: 143 mEq/L (ref 135–145)
Total Bilirubin: 0.6 mg/dL (ref 0.2–1.2)
Total Protein: 6.4 g/dL (ref 6.0–8.3)

## 2021-01-12 LAB — CBC WITH DIFFERENTIAL/PLATELET
Basophils Absolute: 0.1 10*3/uL (ref 0.0–0.1)
Basophils Relative: 1.5 % (ref 0.0–3.0)
Eosinophils Absolute: 0.3 10*3/uL (ref 0.0–0.7)
Eosinophils Relative: 6.5 % — ABNORMAL HIGH (ref 0.0–5.0)
HCT: 41.8 % (ref 39.0–52.0)
Hemoglobin: 13.8 g/dL (ref 13.0–17.0)
Lymphocytes Relative: 16.4 % (ref 12.0–46.0)
Lymphs Abs: 0.7 10*3/uL (ref 0.7–4.0)
MCHC: 33.1 g/dL (ref 30.0–36.0)
MCV: 98.3 fl (ref 78.0–100.0)
Monocytes Absolute: 0.5 10*3/uL (ref 0.1–1.0)
Monocytes Relative: 12.2 % — ABNORMAL HIGH (ref 3.0–12.0)
Neutro Abs: 2.8 10*3/uL (ref 1.4–7.7)
Neutrophils Relative %: 63.4 % (ref 43.0–77.0)
Platelets: 171 10*3/uL (ref 150.0–400.0)
RBC: 4.25 Mil/uL (ref 4.22–5.81)
RDW: 13.9 % (ref 11.5–15.5)
WBC: 4.4 10*3/uL (ref 4.0–10.5)

## 2021-01-12 LAB — LIPID PANEL
Cholesterol: 170 mg/dL (ref 0–200)
HDL: 73.5 mg/dL (ref >39.00)
LDL Cholesterol: 85 mg/dL (ref 0–99)
NonHDL: 96.48
Total CHOL/HDL Ratio: 2
Triglycerides: 59 mg/dL (ref 0.0–149.0)
VLDL: 11.8 mg/dL (ref 0.0–40.0)

## 2021-01-12 LAB — TSH: TSH: 2.5 u[IU]/mL (ref 0.35–5.50)

## 2021-01-12 MED ORDER — PRAVASTATIN SODIUM 40 MG PO TABS: 40.0000 mg | ORAL_TABLET | Freq: Every day | ORAL | 3 refills | Status: DC

## 2021-01-12 MED ORDER — LEVOTHYROXINE SODIUM 125 MCG PO TABS: 125.0000 ug | ORAL_TABLET | Freq: Every day | ORAL | 3 refills | Status: DC

## 2021-01-12 NOTE — Progress Notes (Signed)
Victor Osborne is a 87 y.o. male with the following history as recorded in EpicCare:  Patient Active Problem List   Diagnosis Date Noted   Chronic post-traumatic stress disorder 12/05/2017   Bipolar I disorder, current or most recent episode depressed, in partial remission ([place]) 12/05/2017   Hypothyroidism 10/11/2016   Parkinson disease ([place]) 09/08/2016   Pacemaker 09/24/2013   Sinoatrial node dysfunction ([place]) 06/19/2013   First degree AV block 04/09/2013   Allergic rhinitis 03/17/2013   Obstructive sleep apnea 12/18/2006   COPD mixed type ([place]) 12/18/2006   MELANOMA, HX OF 12/18/2006    Current Outpatient Medications  Medication Sig Dispense Refill   acetaminophen (TYLENOL) 325 MG tablet Take 650 mg by mouth every 6 (six) hours as needed.     alclomethasone (ACLOVATE) 0.05 % ointment Apply 1 application topically 2 (two) times daily as needed (for rash).     Bacillus Coagulans-Inulin (PROBIOTIC-PREBIOTIC) 1-250 BILLION-MG CAPS Take by mouth.     Bee Pollen 550 MG CAPS Take by mouth.     carbamazepine (TEGRETOL) 200 MG tablet Take 1 tablet (200 mg total) by mouth 2 (two) times daily. 180 tablet 1   carbidopa-levodopa (SINEMET IR) 25-100 MG tablet Take 2 tablets by mouth 3 (three) times daily. 540 tablet 1   cholecalciferol (VITAMIN D) 1000 UNITS tablet Take 2,000 Units by mouth daily.     Coenzyme Q10 (CO Q 10 PO) Take by mouth.     desoximetasone (TOPICORT) 0.25 % cream Apply 1 application topically 2 (two) times daily.     ferrous sulfate 324 (65 Fe) MG TBEC Take 324 mg by mouth.     Flaxseed, Linseed, (FLAX SEED OIL) 1300 MG CAPS Take 1 capsule by mouth daily.     FLUoxetine (PROZAC) 10 MG capsule Take 1 capsule (10 mg total) by mouth daily. 90 capsule 1   MAGNESIUM PO Take by mouth.     Multiple Vitamin (MULITIVITAMIN WITH MINERALS) TABS Take 1 tablet by mouth daily.     Omega-3 Fatty Acids (OMEGA-3 CF PO) Take 1,200 mg by mouth daily.     POTASSIUM GLUCONATE PO Take by  mouth.     QUEtiapine (SEROQUEL XR) 50 MG TB24 24 hr tablet Take 1 tablet (50 mg total) by mouth at bedtime. 90 tablet 1   TURMERIC CURCUMIN PO Take by mouth.     levothyroxine (LEVOTHROID) 125 MCG tablet Take 1 tablet (125 mcg total) by mouth daily before breakfast. 90 tablet 3   pravastatin (PRAVACHOL) 40 MG tablet Take 1 tablet (40 mg total) by mouth daily. 90 tablet 3   No current facility-administered medications for this visit.    Allergies: Patient has no known allergies.  Past Medical History:  Diagnosis Date   Bipolar 1 disorder ([facility])    COPD (chronic obstructive pulmonary disease) ([place])    Depression    First degree AV block    Hypertension    pt denies but was in medical record   Hypoglycemia, unspecified    Myalgia and myositis, unspecified    Obesity    Pneumonia 1970   Skin cancer (melanoma) ([place])    Thyroid disease    Unspecified hypothyroidism    UPJ obstruction, congenital     Past Surgical History:  Procedure Laterality Date   APPENDECTOMY  1939   malignant melanoma removed from right forehead  2008   PERMANENT PACEMAKER INSERTION N/A 06/19/2013   Procedure: PERMANENT PACEMAKER INSERTION;  Surgeon: Evans Lance, MD;  Location:  CATH LAB;  Service: Cardiovascular;  Laterality: N/A;  ° right big toe  surgery  2009  ° TRANSURETHRAL RESECTION OF PROSTATE  02/04/2011  ° Procedure: TRANSURETHRAL RESECTION OF THE PROSTATE (TURP);  Surgeon: Les Borden, MD;  Location: [facility];  Service: Urology;  Laterality: N/A;  ° URETHRA SURGERY  2010  ° reconstruction  °  °Family History  °Problem Relation Age of Onset  ° Cerebral aneurysm Mother   ° Lung cancer Father   ° Drug abuse Daughter   °  °Social History  ° °Tobacco Use  ° Smoking status: Former  °  Packs/day: 2.00  °  Years: 40.00  °  Pack years: 80.00  °  Types: Cigarettes  ° Smokeless tobacco: Never  °Substance Use Topics  ° Alcohol use: No  °  °Subjective:  ° °Patient presents today as a new patient; transferring from [facility]- does not have any records for review today;  °History of Bipolar Disorder- under care of psychiatrist; °History of Parkison's Disease- under care of neurology; currently participating in PT- feeling good response helping with balance.  °History of pacemaker- Dr. Taylor;  °History of melanoma- sees dermatology regularly;  °COPD- Dr. Young at Pulmonology ° ° ° ° °Objective:  °Vitals:  ° 01/12/21 0930  °BP: 122/66  °Pulse: 68  °Temp: (!) 97.3 °F (36.3 °C)  °TempSrc: Oral  °SpO2: 97%  °Weight: 200 lb 3.2 oz (90.8 kg)  °Height: 6' 4" (1.93 m)  °  °General: Well developed, well nourished, in no acute distress  °Skin : Warm and dry.  °Head: Normocephalic and atraumatic  °Eyes: Sclera and conjunctiva clear; pupils round and reactive to light; extraocular movements intact  °Ears: External normal; canals clear; tympanic membranes normal  °Oropharynx: Pink, supple. No suspicious lesions  °Neck: Supple without thyromegaly, adenopathy  °Lungs: Respirations unlabored; clear to auscultation bilaterally without wheeze, rales, rhonchi  °CVS exam: normal rate and regular rhythm.  °Neurologic: Alert and oriented; speech intact; face symmetrical; uses cane for balance; ° °Assessment:  °1. Hypothyroidism, unspecified type   °2. Hyperlipidemia, unspecified hyperlipidemia type   °3. Parkinson disease (HCC)   °4. First degree AV block   °5. COPD mixed type (HCC)   °6. MELANOMA, HX OF   °7. Pacemaker   °8. Bipolar I disorder, current or most recent episode depressed, in partial remission (HCC)   °  °Plan:  °Update lab today; will update RX based on TSH; °Update labs today; continue Pravachol as previously prescribed; °Continue with neurology and PT; °Continue with cardiology; °Continue with pulmonology; °Continue with dermatology; °8.   Stable on regimen; continue with psychiatrist;  ° °This visit occurred during the SARS-CoV-2 public health emergency.  Safety protocols were in place, including screening questions prior to the  visit, additional usage of staff PPE, and extensive cleaning of exam room while observing appropriate contact time as indicated for disinfecting solutions.  ° ° °No follow-ups on file.  °Orders Placed This Encounter  °Procedures  ° CBC with Differential/Platelet  ° Comp Met (CMET)  ° Lipid panel  ° TSH  °  °Requested Prescriptions  ° °Signed Prescriptions Disp Refills  ° levothyroxine (LEVOTHROID) 125 MCG tablet 90 tablet 3  °  Sig: Take 1 tablet (125 mcg total) by mouth daily before breakfast.  ° pravastatin (PRAVACHOL) 40 MG tablet 90 tablet 3  °  Sig: Take 1 tablet (40 mg total) by mouth daily.  °  ° °

## 2021-01-14 ENCOUNTER — Encounter: Payer: Self-pay | Admitting: Physical Therapy

## 2021-01-14 ENCOUNTER — Other Ambulatory Visit: Payer: Self-pay

## 2021-01-14 ENCOUNTER — Ambulatory Visit: Payer: Medicare Other | Admitting: Physical Therapy

## 2021-01-14 DIAGNOSIS — R2689 Other abnormalities of gait and mobility: Secondary | ICD-10-CM | POA: Diagnosis not present

## 2021-01-14 DIAGNOSIS — R2681 Unsteadiness on feet: Secondary | ICD-10-CM

## 2021-01-14 DIAGNOSIS — R293 Abnormal posture: Secondary | ICD-10-CM

## 2021-01-14 NOTE — Therapy (Signed)
[facility] [address]  [place], [place], [address] Phone: [phone]   Fax:  [phone]  Physical Therapy Treatment / Discharge Summary  Patient Details  Name: Victor Osborne MRN: [id number] Date of Birth: [birth year] Referring Provider (PT): Alonza Bogus, DO   Encounter Date: 01/14/2021   PT End of Session - 01/14/21 0851     Visit Number 15    Number of Visits 15    Date for PT Re-Evaluation 01/15/21    Authorization Type Medicare & [employer]    PT Start Time [phone]    PT Stop Time 0932    PT Time Calculation (min) 41 min    Activity Tolerance Patient tolerated treatment well    Behavior During Therapy [facility] for tasks assessed/performed             Past Medical History:  Diagnosis Date   Bipolar 1 disorder ([place])    COPD (chronic obstructive pulmonary disease) ([place])    Depression    First degree AV block    Hypertension    pt denies but was in medical record   Hypoglycemia, unspecified    Myalgia and myositis, unspecified    Obesity    Pneumonia 1970   Skin cancer (melanoma) ([place])    Thyroid disease    Unspecified hypothyroidism    UPJ obstruction, congenital     Past Surgical History:  Procedure Laterality Date   APPENDECTOMY  1939   malignant melanoma removed from right forehead  2008   PERMANENT PACEMAKER INSERTION N/A 06/19/2013   Procedure: PERMANENT PACEMAKER INSERTION;  Surgeon: Evans Lance, MD;  Location: [facility] CATH LAB;  Service: Cardiovascular;  Laterality: N/A;   right big toe  surgery  2009   TRANSURETHRAL RESECTION OF PROSTATE  02/04/2011   Procedure: TRANSURETHRAL RESECTION OF THE PROSTATE (TURP);  Surgeon: Dutch Gray, MD;  Location: [facility] ORS;  Service: Urology;  Laterality: N/A;   URETHRA SURGERY  2010   reconstruction    There were no vitals filed for this visit.   Subjective Assessment - 01/14/21 0854     Subjective Pt reports he feels pretty comfortable with the  home program. Pt reports he was able to find a free rollator at the community where he lives which has was able to modify to get the handles to extend to 41" high.    Pertinent History bipolar    Patient Stated Goals "better balance and coordination"    Currently in Pain? No/denies                [facility] PT Assessment - 01/14/21 0851       Assessment   Medical Diagnosis Parkinson's disease    Referring Provider (PT) Alonza Bogus, DO    Onset Date/Surgical Date 10/14/20   MD visit; PD diagnosis ~15 yrs ago   Next MD Visit 04/13/21      Ambulation/Gait   Ambulation/Gait Assistance 7: Independent    Assistive device None    Gait Pattern Within Functional Limits    Gait velocity 4.34 ft/sec      Standardized Balance Assessment   Five times sit to stand comments  13.75 sec    10 Meter Walk 7.56 sec   no AD     Berg Balance Test   Sit to Stand Able to stand without using hands and stabilize independently    Standing Unsupported Able to stand safely 2 minutes    Sitting with  Back Unsupported but Feet Supported on Floor or Stool Able to sit safely and securely 2 minutes    Stand to Sit Sits safely with minimal use of hands    Transfers Able to transfer safely, minor use of hands    Standing Unsupported with Eyes Closed Able to stand 10 seconds safely    Standing Unsupported with Feet Together Able to place feet together independently and stand 1 minute safely    From Standing, Reach Forward with Outstretched Arm Can reach confidently >25 cm (10")    From Standing Position, Pick up Object from Floor Able to pick up shoe safely and easily    From Standing Position, Turn to Look Behind Over each Shoulder Looks behind from both sides and weight shifts well    Turn 360 Degrees Able to turn 360 degrees safely one side only in 4 seconds or less    Standing Unsupported, Alternately Place Feet on Step/Stool Able to stand independently and safely and complete 8 steps in 20 seconds    Standing  Unsupported, One Foot in Front Able to plae foot ahead of the other independently and hold 30 seconds    Standing on One Leg Able to lift leg independently and hold 5-10 seconds    Total Score 53    Berg comment: 52-55 lower (> 25%)      Timed Up and Go Test   Normal TUG (seconds) 9.38    Cognitive TUG (seconds) 11.88   difficulty with cognitive task     Functional Gait  Assessment   Gait Level Surface Walks 20 ft in less than 5.5 sec, no assistive devices, good speed, no evidence for imbalance, normal gait pattern, deviates no more than 6 in outside of the 12 in walkway width.    Change in Gait Speed Able to smoothly change walking speed without loss of balance or gait deviation. Deviate no more than 6 in outside of the 12 in walkway width.    Gait with Horizontal Head Turns Performs head turns smoothly with no change in gait. Deviates no more than 6 in outside 12 in walkway width    Gait with Vertical Head Turns Performs head turns with no change in gait. Deviates no more than 6 in outside 12 in walkway width.    Gait and Pivot Turn Pivot turns safely within 3 sec and stops quickly with no loss of balance.    Step Over Obstacle Is able to step over 2 stacked shoe boxes taped together (9 in total height) without changing gait speed. No evidence of imbalance.    Gait with Narrow Base of Support Ambulates 4-7 steps.    Gait with Eyes Closed Walks 20 ft, no assistive devices, good speed, no evidence of imbalance, normal gait pattern, deviates no more than 6 in outside 12 in walkway width. Ambulates 20 ft in less than 7 sec.    Ambulating Backwards Walks 20 ft, no assistive devices, good speed, no evidence for imbalance, normal gait    Steps Alternating feet, must use rail.    Total Score 27    FGA comment: Scores 25-28 = low risk fall                           [facility] Adult PT Treatment/Exercise - 01/14/21 0851       Knee/Hip Exercises: Aerobic   Nustep L6 x 6 min (UE/LE to  promote reciprocal movement)  PT Short Term Goals - 11/17/20 1128       PT SHORT TERM GOAL #1   Title Patient will be independent with initial HEP    Status Achieved   11/17/20 (pt denies concerns with HEP)   Target Date 11/16/20               PT Long Term Goals - 01/14/21 0855       PT LONG TERM GOAL #1   Title Patient will be independent with ongoing/advanced HEP for self-management at home    Status Achieved   01/14/21     PT LONG TERM GOAL #2   Title Patient will improve 5x STS time to </= 15 seconds for improved efficiency and safety with transfers    Baseline 10/26/20 - 18.18 sec; 11/23/20 - 14.69 sec; 12/29 - 13.75 sec    Status Achieved   11/23/20     PT LONG TERM GOAL #3   Title Patient will improve Berg score to >/= 52/56 to improve safety and stability with ADLs in standing and reduce risk for falls    Baseline 10/26/20 - 47/56; 12/04/20 - 50/56; 01/14/21 - 53/56    Status Achieved   01/14/21     PT LONG TERM GOAL #4   Title Patient will improve FGA score to >/= 22/30 to improve gait stability and reduce risk for falls    Baseline 10/26/20 - 18/30; 11/23/20 - 25/30; 01/14/21 - 27/30    Status Achieved   11/23/20     PT LONG TERM GOAL #5   Title Patient will ambulate outdoor, unlevel surfaces, at least 1000 ft, independently, no LOB, for improved community gait    Status Achieved   01/14/21     PT LONG TERM GOAL #6   Title Patient will verbalize understanding of fall prevention in the home environment    Status Achieved   12/23/20     PT LONG TERM GOAL #7   Title Patient will verbalize understanding of local Parkinson's disease resources    Status Partially Met   12/04/20 - Pt has been added to [employer] email list                  Plan - 01/14/21 0933     Comorbidities PPM 2 1st degree heart block & SA node dysfunction, OSA, COPD, hypothyroidism, chronic PTSD, bipolar I disorder, decreased endurance  since COVID-19 in ~March 2022    Rehab Potential Good    PT Frequency --    PT Duration --   4-6 weeks   PT Treatment/Interventions ADLs/Self Care Home Management;DME Instruction;Gait training;Stair training;Functional mobility training;Therapeutic activities;Therapeutic exercise;Balance training;Neuromuscular re-education;Patient/family education;Manual techniques;Energy conservation    PT Next Visit Plan Discharge with transition to HEP; 81-monthPD screen scheduled for 08/12/20    PT Home Exercise Plan Access Code: [email or web address](10/12, update 11/22); PWR! Moves - Seated (10/12), Standing & PWR! Walking routine (11/7), Supine & Prone (11/15); All 4's/Quadruped (11/18)    Consulted and Agree with Plan of Care Patient             Patient will benefit from skilled therapeutic intervention in order to improve the following deficits and impairments:  Abnormal gait, Decreased activity tolerance, Decreased balance, Decreased endurance, Decreased mobility, Decreased safety awareness, Difficulty walking, Impaired perceived functional ability, Impaired flexibility, Postural dysfunction  Visit Diagnosis: Other abnormalities of gait and mobility  Unsteadiness on feet  Abnormal posture     Problem List Patient Active Problem List  Diagnosis Date Noted   Chronic post-traumatic stress disorder 12/05/2017   Bipolar I disorder, current or most recent episode depressed, in partial remission ([place]) 12/05/2017   Hypothyroidism 10/11/2016   Parkinson disease ([place]) 09/08/2016   Pacemaker 09/24/2013   Sinoatrial node dysfunction ([place]) 06/19/2013   First degree AV block 04/09/2013   Allergic rhinitis 03/17/2013   Obstructive sleep apnea 12/18/2006   COPD mixed type ([place]) 12/18/2006   MELANOMA, HX OF 12/18/2006     PHYSICAL THERAPY DISCHARGE SUMMARY  Visits from Start of Care: 15  Current functional level related to goals / functional outcomes:   Refer to above clinical impression.   Remaining  deficits:   As above.   Education / Equipment:   HEP; PWR! Moves; [employer] Parkinson's information   Patient agrees to discharge. Patient goals were met. Patient is being discharged due to meeting the stated rehab goals.  Percival Spanish, PT 01/14/2021, 1:47 PM  [facility] [address]  [place], [place], [address] Phone: [phone]   Fax:  [phone]  Name: Victor Osborne MRN: [id number] Date of Birth: 10-03-1932

## 2021-03-16 ENCOUNTER — Ambulatory Visit (INDEPENDENT_AMBULATORY_CARE_PROVIDER_SITE_OTHER): Payer: Medicare Other

## 2021-03-16 DIAGNOSIS — I495 Sick sinus syndrome: Secondary | ICD-10-CM

## 2021-03-16 LAB — CUP PACEART REMOTE DEVICE CHECK
Battery Remaining Longevity: 54 mo
Battery Remaining Percentage: 60 %
Brady Statistic RA Percent Paced: 79 %
Brady Statistic RV Percent Paced: 63 %
Date Time Interrogation Session: 20230228003400
Implantable Lead Implant Date: 20150603
Implantable Lead Implant Date: 20150603
Implantable Lead Location: 753859
Implantable Lead Location: 753860
Implantable Lead Model: 4136
Implantable Lead Model: 4137
Implantable Lead Serial Number: 29477746
Implantable Lead Serial Number: 29617326
Implantable Pulse Generator Implant Date: 20150603
Lead Channel Impedance Value: 559 Ohm
Lead Channel Impedance Value: 753 Ohm
Lead Channel Pacing Threshold Amplitude: 0.6 V
Lead Channel Pacing Threshold Amplitude: 1.5 V
Lead Channel Pacing Threshold Pulse Width: 0.4 ms
Lead Channel Pacing Threshold Pulse Width: 0.5 ms
Lead Channel Setting Pacing Amplitude: 1.8 V
Lead Channel Setting Pacing Amplitude: 2 V
Lead Channel Setting Pacing Pulse Width: 0.4 ms
Lead Channel Setting Sensing Sensitivity: 2.5 mV
Pulse Gen Serial Number: 389736

## 2021-03-23 NOTE — Progress Notes (Signed)
Remote pacemaker transmission.   

## 2021-03-26 ENCOUNTER — Telehealth: Payer: Self-pay

## 2021-03-26 MED ORDER — CARBIDOPA-LEVODOPA 25-100 MG PO TABS: 2.0000 | ORAL_TABLET | Freq: Three times a day (TID) | ORAL | 0 refills | Status: DC

## 2021-03-26 NOTE — Telephone Encounter (Signed)
New message   Patient calling asking can medication be called in today out of medication .   1. Which medications need to be refilled? (please list name of each medication and dose if known)carbidopa-levodopa (SINEMET IR) 25-100 MG tablet  2. Which pharmacy/location (including street and city if local pharmacy) is medication to be sent to? [id number] - [place], [place] - [address] AT [facility] OF [address]  3. Do they need a 30 day or 90 day supply? 90 day supply

## 2021-03-26 NOTE — Telephone Encounter (Signed)
Pt called an informed that we sent enough of his carbidopa levodopa to last until his appointment 3/28 pt verbalized understanding,  ?

## 2021-03-30 ENCOUNTER — Other Ambulatory Visit: Payer: Self-pay

## 2021-03-30 ENCOUNTER — Encounter (HOSPITAL_BASED_OUTPATIENT_CLINIC_OR_DEPARTMENT_OTHER): Payer: Self-pay | Admitting: Emergency Medicine

## 2021-03-30 ENCOUNTER — Inpatient Hospital Stay (HOSPITAL_BASED_OUTPATIENT_CLINIC_OR_DEPARTMENT_OTHER)
Admission: EM | Admit: 2021-03-30 | Discharge: 2021-04-02 | DRG: 069 | Disposition: A | Discharge status: Home Health | Payer: Medicare Other | Attending: Internal Medicine | Admitting: Internal Medicine

## 2021-03-30 ENCOUNTER — Emergency Department (HOSPITAL_BASED_OUTPATIENT_CLINIC_OR_DEPARTMENT_OTHER): Payer: Medicare Other

## 2021-03-30 DIAGNOSIS — E039 Hypothyroidism, unspecified: Secondary | ICD-10-CM | POA: Diagnosis present

## 2021-03-30 DIAGNOSIS — G20 Parkinson's disease: Secondary | ICD-10-CM | POA: Diagnosis present

## 2021-03-30 DIAGNOSIS — J449 Chronic obstructive pulmonary disease, unspecified: Secondary | ICD-10-CM | POA: Diagnosis present

## 2021-03-30 DIAGNOSIS — I10 Essential (primary) hypertension: Secondary | ICD-10-CM | POA: Diagnosis present

## 2021-03-30 DIAGNOSIS — G459 Transient cerebral ischemic attack, unspecified: Secondary | ICD-10-CM | POA: Diagnosis present

## 2021-03-30 DIAGNOSIS — I442 Atrioventricular block, complete: Secondary | ICD-10-CM | POA: Diagnosis present

## 2021-03-30 DIAGNOSIS — E785 Hyperlipidemia, unspecified: Secondary | ICD-10-CM | POA: Diagnosis present

## 2021-03-30 DIAGNOSIS — F3175 Bipolar disorder, in partial remission, most recent episode depressed: Secondary | ICD-10-CM | POA: Diagnosis not present

## 2021-03-30 DIAGNOSIS — S60222A Contusion of left hand, initial encounter: Secondary | ICD-10-CM | POA: Diagnosis present

## 2021-03-30 DIAGNOSIS — F319 Bipolar disorder, unspecified: Secondary | ICD-10-CM | POA: Diagnosis present

## 2021-03-30 DIAGNOSIS — I495 Sick sinus syndrome: Secondary | ICD-10-CM | POA: Diagnosis present

## 2021-03-30 DIAGNOSIS — Z85820 Personal history of malignant melanoma of skin: Secondary | ICD-10-CM

## 2021-03-30 DIAGNOSIS — M47812 Spondylosis without myelopathy or radiculopathy, cervical region: Secondary | ICD-10-CM | POA: Diagnosis present

## 2021-03-30 DIAGNOSIS — Z950 Presence of cardiac pacemaker: Secondary | ICD-10-CM | POA: Diagnosis not present

## 2021-03-30 DIAGNOSIS — Z79890 Hormone replacement therapy (postmenopausal): Secondary | ICD-10-CM

## 2021-03-30 DIAGNOSIS — Z20822 Contact with and (suspected) exposure to covid-19: Secondary | ICD-10-CM | POA: Diagnosis present

## 2021-03-30 DIAGNOSIS — R29701 NIHSS score 1: Secondary | ICD-10-CM | POA: Diagnosis present

## 2021-03-30 DIAGNOSIS — R531 Weakness: Secondary | ICD-10-CM | POA: Diagnosis present

## 2021-03-30 DIAGNOSIS — W19XXXA Unspecified fall, initial encounter: Secondary | ICD-10-CM | POA: Diagnosis present

## 2021-03-30 DIAGNOSIS — G4733 Obstructive sleep apnea (adult) (pediatric): Secondary | ICD-10-CM | POA: Diagnosis present

## 2021-03-30 DIAGNOSIS — Z9049 Acquired absence of other specified parts of digestive tract: Secondary | ICD-10-CM | POA: Diagnosis not present

## 2021-03-30 DIAGNOSIS — R29898 Other symptoms and signs involving the musculoskeletal system: Principal | ICD-10-CM

## 2021-03-30 DIAGNOSIS — J439 Emphysema, unspecified: Secondary | ICD-10-CM | POA: Diagnosis present

## 2021-03-30 DIAGNOSIS — I951 Orthostatic hypotension: Secondary | ICD-10-CM | POA: Diagnosis present

## 2021-03-30 DIAGNOSIS — M6281 Muscle weakness (generalized): Secondary | ICD-10-CM

## 2021-03-30 DIAGNOSIS — Z87891 Personal history of nicotine dependence: Secondary | ICD-10-CM | POA: Diagnosis not present

## 2021-03-30 DIAGNOSIS — E032 Hypothyroidism due to medicaments and other exogenous substances: Secondary | ICD-10-CM | POA: Diagnosis not present

## 2021-03-30 DIAGNOSIS — I63421 Cerebral infarction due to embolism of right anterior cerebral artery: Secondary | ICD-10-CM | POA: Diagnosis not present

## 2021-03-30 DIAGNOSIS — Z79899 Other long term (current) drug therapy: Secondary | ICD-10-CM | POA: Diagnosis not present

## 2021-03-30 DIAGNOSIS — I6389 Other cerebral infarction: Secondary | ICD-10-CM | POA: Diagnosis not present

## 2021-03-30 DIAGNOSIS — I6340 Cerebral infarction due to embolism of unspecified cerebral artery: Secondary | ICD-10-CM | POA: Insufficient documentation

## 2021-03-30 LAB — RAPID URINE DRUG SCREEN, HOSP PERFORMED
Amphetamines: NOT DETECTED
Barbiturates: NOT DETECTED
Benzodiazepines: NOT DETECTED
Cocaine: NOT DETECTED
Opiates: NOT DETECTED
Tetrahydrocannabinol: NOT DETECTED

## 2021-03-30 LAB — BASIC METABOLIC PANEL
Anion gap: 8 (ref 5–15)
BUN: 25 mg/dL — ABNORMAL HIGH (ref 8–23)
CO2: 25 mmol/L (ref 22–32)
Calcium: 9.1 mg/dL (ref 8.9–10.3)
Chloride: 107 mmol/L (ref 98–111)
Creatinine, Ser: 1.3 mg/dL — ABNORMAL HIGH (ref 0.61–1.24)
GFR, Estimated: 54 mL/min — ABNORMAL LOW (ref >60)
Glucose, Bld: 108 mg/dL — ABNORMAL HIGH (ref 70–99)
Potassium: 4.4 mmol/L (ref 3.5–5.1)
Sodium: 140 mmol/L (ref 135–145)

## 2021-03-30 LAB — CBC WITH DIFFERENTIAL/PLATELET
Abs Immature Granulocytes: 0.04 10*3/uL (ref 0.00–0.07)
Basophils Absolute: 0.1 10*3/uL (ref 0.0–0.1)
Basophils Relative: 1 %
Eosinophils Absolute: 0 10*3/uL (ref 0.0–0.5)
Eosinophils Relative: 0 %
HCT: 44 % (ref 39.0–52.0)
Hemoglobin: 14.5 g/dL (ref 13.0–17.0)
Immature Granulocytes: 1 %
Lymphocytes Relative: 6 %
Lymphs Abs: 0.5 10*3/uL — ABNORMAL LOW (ref 0.7–4.0)
MCH: 32.7 pg (ref 26.0–34.0)
MCHC: 33 g/dL (ref 30.0–36.0)
MCV: 99.1 fL (ref 80.0–100.0)
Monocytes Absolute: 0.5 10*3/uL (ref 0.1–1.0)
Monocytes Relative: 7 %
Neutro Abs: 7 10*3/uL (ref 1.7–7.7)
Neutrophils Relative %: 85 %
Platelets: 178 10*3/uL (ref 150–400)
RBC: 4.44 MIL/uL (ref 4.22–5.81)
RDW: 13.5 % (ref 11.5–15.5)
WBC: 8.2 10*3/uL (ref 4.0–10.5)
nRBC: 0 % (ref 0.0–0.2)

## 2021-03-30 LAB — CBC
HCT: 43.2 % (ref 39.0–52.0)
Hemoglobin: 14.3 g/dL (ref 13.0–17.0)
MCH: 32.6 pg (ref 26.0–34.0)
MCHC: 33.1 g/dL (ref 30.0–36.0)
MCV: 98.4 fL (ref 80.0–100.0)
Platelets: 165 10*3/uL (ref 150–400)
RBC: 4.39 MIL/uL (ref 4.22–5.81)
RDW: 13.5 % (ref 11.5–15.5)
WBC: 7.7 10*3/uL (ref 4.0–10.5)
nRBC: 0 % (ref 0.0–0.2)

## 2021-03-30 LAB — URINALYSIS, MICROSCOPIC (REFLEX): RBC / HPF: NONE SEEN RBC/hpf (ref 0–5)

## 2021-03-30 LAB — RESP PANEL BY RT-PCR (FLU A&B, COVID) ARPGX2
Influenza A by PCR: NEGATIVE
Influenza B by PCR: NEGATIVE
SARS Coronavirus 2 by RT PCR: NEGATIVE

## 2021-03-30 LAB — URINALYSIS, ROUTINE W REFLEX MICROSCOPIC
Bilirubin Urine: NEGATIVE
Glucose, UA: NEGATIVE mg/dL
Hgb urine dipstick: NEGATIVE
Ketones, ur: NEGATIVE mg/dL
Nitrite: NEGATIVE
Protein, ur: NEGATIVE mg/dL
Specific Gravity, Urine: 1.01 (ref 1.005–1.030)
pH: 5.5 (ref 5.0–8.0)

## 2021-03-30 LAB — CBG MONITORING, ED: Glucose-Capillary: 92 mg/dL (ref 70–99)

## 2021-03-30 LAB — PROTIME-INR
INR: 1.1 (ref 0.8–1.2)
Prothrombin Time: 13.8 seconds (ref 11.4–15.2)

## 2021-03-30 LAB — APTT: aPTT: 27 seconds (ref 24–36)

## 2021-03-30 LAB — ETHANOL: Alcohol, Ethyl (B): <10 mg/dL (ref <10)

## 2021-03-30 MED ORDER — CLOPIDOGREL BISULFATE 75 MG PO TABS
75.0000 mg | ORAL_TABLET | Freq: Every day | ORAL | Status: DC
  Administered 2021-03-30 – 2021-04-02 (×4): 75 mg via ORAL
  Filled 2021-03-30 (×4): qty 1

## 2021-03-30 MED ORDER — ENOXAPARIN SODIUM 40 MG/0.4ML IJ SOSY
40.0000 mg | PREFILLED_SYRINGE | INTRAMUSCULAR | Status: DC
  Administered 2021-03-30 – 2021-04-01 (×3): 40 mg via SUBCUTANEOUS
  Filled 2021-03-30 (×3): qty 0.4

## 2021-03-30 MED ORDER — ACETAMINOPHEN 160 MG/5ML PO SOLN: 650.0000 mg | ORAL | Status: DC | PRN

## 2021-03-30 MED ORDER — ACETAMINOPHEN 325 MG PO TABS: 650.0000 mg | ORAL_TABLET | ORAL | Status: DC | PRN

## 2021-03-30 MED ORDER — STROKE: EARLY STAGES OF RECOVERY BOOK
Freq: Once | Status: AC
  Filled 2021-03-30: qty 1

## 2021-03-30 MED ORDER — ASPIRIN 81 MG PO CHEW
81.0000 mg | CHEWABLE_TABLET | Freq: Every day | ORAL | Status: DC
  Administered 2021-03-30 – 2021-03-31 (×2): 81 mg via ORAL
  Filled 2021-03-30 (×2): qty 1

## 2021-03-30 MED ORDER — ACETAMINOPHEN 650 MG RE SUPP: 650.0000 mg | RECTAL | Status: DC | PRN

## 2021-03-30 NOTE — Plan of Care (Signed)
Transfer from [facility] ?Victor Osborne is a 88 year old male with pmh hypertension, hyperlipidemia,SSS s/p pacemaker, COPD, and hypothyroidism presented with acute onset of left leg weakness this a.m. LKN was last night.  CT scan of the head without contrast did not note any acute abnormality.  Patient was noted to be afebrile mild tachypnea, but signs otherwise stable.  Labs significant for BUN 25 and creatinine 1.3 suggesting mild renal insufficiency.  He is not a candidate for thrombolytics.  X-rays of the chest and left hand did not show any acute abnormalities.  Patient's pacemaker is not MRI compatible.  Neurology recommended patient be transferred to [facility] for completion of stroke work-up.  He was started on aspirin and Plavix. ?

## 2021-03-30 NOTE — ED Provider Notes (Signed)
?[name] ?Provider Note ? ? ?CSN: [id number] ?Arrival date & time: 03/30/21  1219 ? ?  ? ?History ? ?Chief Complaint  ?Patient presents with  ? Fall  ? ? ?Victor Osborne is a 88 y.o. male. ? ?Pt is a 88 yo male with a hx of copd, bipolar d/o, depression, and 1st degree AV block requiring a pacemaker (placed in 2015).  He woke up this morning with his left leg not working.  The pt said he tried to get up and he fell.  He said his left leg would not support any weight.  He fell on his left hand and sustained some contusions.  Pt said he was able to get himself to his walker and his left foot just dragged.  He went to the bathroom and checked his other body parts for weakness or facial droop and that was ok.  He thought the leg would get better.  He called a friend who drove him to the ED after a few hrs. ? ? ?  ? ?Home Medications ?Prior to Admission medications   ?Medication Sig Start Date End Date Taking? Authorizing Provider  ?acetaminophen (TYLENOL) 325 MG tablet Take 650 mg by mouth every 6 (six) hours as needed.    [provider]  ?alclomethasone (ACLOVATE) 0.05 % ointment Apply 1 application topically 2 (two) times daily as needed (for rash).    [provider]  ?Bacillus Coagulans-Inulin (PROBIOTIC-PREBIOTIC) 1-250 BILLION-MG CAPS Take by mouth.    [provider]  ?Bee Pollen 550 MG CAPS Take by mouth.    [provider]  ?carbamazepine (TEGRETOL) 200 MG tablet Take 1 tablet (200 mg total) by mouth 2 (two) times daily. 12/03/20   Thayer Headings, PMHNP  ?carbidopa-levodopa (SINEMET IR) 25-100 MG tablet Take 2 tablets by mouth 3 (three) times daily. 03/26/21   Tat, Eustace [place]ail, DO  ?cholecalciferol (VITAMIN D) 1000 UNITS tablet Take 2,000 Units by mouth daily.    [provider]  ?Coenzyme Q10 (CO Q 10 PO) Take by mouth.    [provider]  ?desoximetasone (TOPICORT) 0.25 % cream Apply 1 application topically 2 (two)  times daily.    [provider]  ?ferrous sulfate 324 (65 Fe) MG TBEC Take 324 mg by mouth.    [provider]  ?Flaxseed, Linseed, (FLAX SEED OIL) 1300 MG CAPS Take 1 capsule by mouth daily.    [provider]  ?FLUoxetine (PROZAC) 10 MG capsule Take 1 capsule (10 mg total) by mouth daily. 12/03/20   Thayer Headings, PMHNP  ?levothyroxine (LEVOTHROID) 125 MCG tablet Take 1 tablet (125 mcg total) by mouth daily before breakfast. 01/12/21   [name], [place]  ?MAGNESIUM PO Take by mouth.    [provider]  ?Multiple Vitamin (MULITIVITAMIN WITH MINERALS) TABS Take 1 tablet by mouth daily.    [provider]  ?Omega-3 Fatty Acids (OMEGA-3 CF PO) Take 1,200 mg by mouth daily.    [provider]  ?POTASSIUM GLUCONATE PO Take by mouth.    [provider]  ?pravastatin (PRAVACHOL) 40 MG tablet Take 1 tablet (40 mg total) by mouth daily. 01/12/21   [name], [place]  ?QUEtiapine (SEROQUEL XR) 50 MG TB24 24 hr tablet Take 1 tablet (50 mg total) by mouth at bedtime. 12/03/20   Thayer Headings, [place]  ?TURMERIC CURCUMIN PO Take by mouth.    [provider]  ?   ? ?Allergies    ?Patient  has no known allergies.   ? ?Review of Systems   ?Review of Systems  ?Neurological:  Positive for weakness.  ?All other systems reviewed and are negative. ? ?Physical Exam ?Updated Vital Signs ?BP 110/86   Pulse 61   Temp 98 ?F (36.7 ?C) (Oral)   Resp 16   Ht '6\' 4"'$  (1.93 m)   Wt 90.7 kg   SpO2 100%   BMI 24.34 kg/m?  ?Physical Exam ?Vitals and nursing note reviewed.  ?Constitutional:   ?   Appearance: Normal appearance.  ?HENT:  ?   Head: Normocephalic and atraumatic.  ?   Right Ear: External ear normal.  ?   Left Ear: External ear normal.  ?   Nose: Nose normal.  ?   Mouth/Throat:  ?   Mouth: Mucous membranes are moist.  ?   Pharynx: Oropharynx is clear.  ?Eyes:  ?   Extraocular Movements: Extraocular movements intact.  ?   Conjunctiva/sclera:  Conjunctivae normal.  ?   Pupils: Pupils are equal, round, and reactive to light.  ?Cardiovascular:  ?   Rate and Rhythm: Normal rate and regular rhythm.  ?   Pulses: Normal pulses.  ?   Heart sounds: Normal heart sounds.  ?Pulmonary:  ?   Effort: Pulmonary effort is normal.  ?   Breath sounds: Normal breath sounds.  ?Abdominal:  ?   General: Abdomen is flat. Bowel sounds are normal.  ?   Palpations: Abdomen is soft.  ?Musculoskeletal:  ?   Cervical back: Normal range of motion and neck supple.  ?   Comments: Left 1st finger contusion.  Good rom.  ?Skin: ?   General: Skin is warm.  ?   Capillary Refill: Capillary refill takes less than 2 seconds.  ?Neurological:  ?   Mental Status: He is alert and oriented to person, place, and time.  ?   Comments: Weakness in left leg  ?Psychiatric:     ?   Mood and Affect: Mood normal.     ?   Behavior: Behavior normal.  ? ? ?ED Results / Procedures / Treatments   ?Labs ?(all labs ordered are listed, but only abnormal results are displayed) ?Labs Reviewed  ?BASIC METABOLIC PANEL - Abnormal; Notable for the following components:  ?    Result Value  ? Glucose, Bld 108 (*)   ? BUN 25 (*)   ? Creatinine, Ser 1.30 (*)   ? GFR, Estimated 54 (*)   ? All other components within normal limits  ?CBC WITH DIFFERENTIAL/PLATELET - Abnormal; Notable for the following components:  ? Lymphs Abs 0.5 (*)   ? All other components within normal limits  ?RESP PANEL BY RT-PCR (FLU A&B, COVID) ARPGX2  ?CBC  ?ETHANOL  ?PROTIME-INR  ?APTT  ?URINALYSIS, ROUTINE W REFLEX MICROSCOPIC  ?RAPID URINE DRUG SCREEN, HOSP PERFORMED  ?CBG MONITORING, ED  ? ? ?EKG ?EKG Interpretation ? ?Date/Time:  Tuesday March 30 2021 12:39:10 EDT ?Ventricular Rate:  60 ?PR Interval:  226 ?QRS Duration: 162 ?QT Interval:  460 ?QTC Calculation: 460 ?R Axis:   -27 ?Text Interpretation: AV dual-paced rhythm with prolonged AV conduction Abnormal ECG When compared with ECG of 25-Mar-2014 10:12, PREVIOUS ECG IS PRESENT No significant  change since last tracing Confirmed by Isla Pence [phone]) on 03/30/2021 1:46:46 PM ? ?Radiology ?CT HEAD WO CONTRAST ? ?Result Date: 03/30/2021 ?CLINICAL DATA:  A male at age 61 presents with numbness and decreased sensation in the LEFT lower extremity since 10 o'clock last  night. EXAM: CT HEAD WITHOUT CONTRAST TECHNIQUE: Contiguous axial images were obtained from the base of the skull through the vertex without intravenous contrast. RADIATION DOSE REDUCTION: This exam was performed according to the departmental dose-optimization program which includes automated exposure control, adjustment of the mA and/or kV according to patient size and/or use of iterative reconstruction technique. COMPARISON:  July 22, 2016 FINDINGS: Brain: No evidence of acute infarction, hemorrhage, hydrocephalus, extra-axial collection or mass lesion/mass effect. Worsening of atrophy since previous imaging. Signs of chronic microvascular ischemic change. Vascular: No hyperdense vessel or unexpected calcification. Skull: Normal. Negative for fracture or focal lesion. Sinuses/Orbits: No acute finding. Other: None IMPRESSION: 1. No acute intracranial abnormality. 2. Progressive cerebral atrophy and signs of chronic microvascular ischemic change. Electronically Signed   By: Zetta Bills M.D.   On: 03/30/2021 14:13  ? ?DG Chest Portable 1 View ? ?Result Date: 03/30/2021 ?CLINICAL DATA:  Trauma, fall EXAM: PORTABLE CHEST 1 VIEW COMPARISON:  09/28/2020 FINDINGS: Cardiac size is within normal limits. Thoracic aorta is tortuous. There is no focal pulmonary consolidation. There is no significant pleural effusion or pneumothorax. Pacemaker battery is seen in the left infraclavicular region. Overall, no significant interval changes are noted in the lung fields. IMPRESSION: There are no new infiltrates or signs of pulmonary edema. Electronically Signed   By: Elmer Picker M.D.   On: 03/30/2021 13:53  ? ?DG Hand Complete Left ? ?Result Date:  03/30/2021 ?CLINICAL DATA:  fall, left hand swelling EXAM: LEFT HAND - COMPLETE 3+ VIEW COMPARISON:  None. FINDINGS: There is no evidence of acute fracture or dislocation. There is moderate first carpometacarpal and interp

## 2021-03-30 NOTE — Plan of Care (Signed)
?  Problem: Education: ?Goal: Knowledge of disease or condition will improve ?Outcome: Progressing ?Goal: Knowledge of secondary prevention will improve (SELECT ALL) ?Outcome: Progressing ?Goal: Knowledge of patient specific risk factors will improve (INDIVIDUALIZE FOR PATIENT) ?Outcome: Progressing ?Goal: Individualized Educational Video(s) ?Outcome: Progressing ?  ?Problem: Coping: ?Goal: Will verbalize positive feelings about self ?Outcome: Progressing ?Goal: Will identify appropriate support needs ?Outcome: Progressing ?  ?Problem: Health Behavior/Discharge Planning: ?Goal: Ability to manage health-related needs will improve ?Outcome: Progressing ?  ?Problem: Self-Care: ?Goal: Ability to participate in self-care as condition permits will improve ?Outcome: Progressing ?Goal: Verbalization of feelings and concerns over difficulty with self-care will improve ?Outcome: Progressing ?  ?Problem: Ischemic Stroke/TIA Tissue Perfusion: ?Goal: Complications of ischemic stroke/TIA will be minimized ?Outcome: Progressing ?  ?Problem: Education: ?Goal: Knowledge of General Education information will improve ?Description: Including pain rating scale, medication(s)/side effects and non-pharmacologic comfort measures ?Outcome: Progressing ?  ?Problem: Health Behavior/Discharge Planning: ?Goal: Ability to manage health-related needs will improve ?Outcome: Progressing ?  ?Problem: Clinical Measurements: ?Goal: Ability to maintain clinical measurements within normal limits will improve ?Outcome: Progressing ?Goal: Will remain free from infection ?Outcome: Progressing ?Goal: Diagnostic test results will improve ?Outcome: Progressing ?Goal: Respiratory complications will improve ?Outcome: Progressing ?Goal: Cardiovascular complication will be avoided ?Outcome: Progressing ?  ?Problem: Activity: ?Goal: Risk for activity intolerance will decrease ?Outcome: Progressing ?  ?Problem: Nutrition: ?Goal: Adequate nutrition will be  maintained ?Outcome: Progressing ?  ?Problem: Coping: ?Goal: Level of anxiety will decrease ?Outcome: Progressing ?  ?Problem: Elimination: ?Goal: Will not experience complications related to bowel motility ?Outcome: Progressing ?Goal: Will not experience complications related to urinary retention ?Outcome: Progressing ?  ?Problem: Pain Managment: ?Goal: General experience of comfort will improve ?Outcome: Progressing ?  ?Problem: Safety: ?Goal: Ability to remain free from injury will improve ?Outcome: Progressing ?  ?Problem: Skin Integrity: ?Goal: Risk for impaired skin integrity will decrease ?Outcome: Progressing ?  ?

## 2021-03-30 NOTE — ED Notes (Signed)
Patient transported to CT 

## 2021-03-30 NOTE — ED Notes (Signed)
Report provided to [facility] for transport to [facility] 

## 2021-03-30 NOTE — ED Triage Notes (Addendum)
Pt arrives pov, to triage in wheelchair, c/o fall this am after acute left leg weakness. Pt endorses numbness, decreased sensation distal LLE. Pt AOx4, speech clear. Last normal 10pm ?

## 2021-03-31 ENCOUNTER — Inpatient Hospital Stay (HOSPITAL_COMMUNITY): Payer: Medicare Other

## 2021-03-31 DIAGNOSIS — G20 Parkinson's disease: Secondary | ICD-10-CM

## 2021-03-31 DIAGNOSIS — I6389 Other cerebral infarction: Secondary | ICD-10-CM

## 2021-03-31 DIAGNOSIS — I6340 Cerebral infarction due to embolism of unspecified cerebral artery: Secondary | ICD-10-CM | POA: Insufficient documentation

## 2021-03-31 DIAGNOSIS — I495 Sick sinus syndrome: Secondary | ICD-10-CM

## 2021-03-31 DIAGNOSIS — R29898 Other symptoms and signs involving the musculoskeletal system: Secondary | ICD-10-CM | POA: Diagnosis not present

## 2021-03-31 DIAGNOSIS — E032 Hypothyroidism due to medicaments and other exogenous substances: Secondary | ICD-10-CM

## 2021-03-31 DIAGNOSIS — Z950 Presence of cardiac pacemaker: Secondary | ICD-10-CM

## 2021-03-31 LAB — ECHOCARDIOGRAM COMPLETE
AR max vel: 3.66 cm2
AV Area VTI: 3.36 cm2
AV Area mean vel: 3.67 cm2
AV Mean grad: 3 mmHg
AV Peak grad: 6.4 mmHg
Ao pk vel: 1.26 m/s
Area-P 1/2: 2.02 cm2
Calc EF: 61.5 %
Height: 76 in
S' Lateral: 2.5 cm
Single Plane A2C EF: 61.2 %
Single Plane A4C EF: 65.3 %
Weight: 3200 oz

## 2021-03-31 LAB — LIPID PANEL
Cholesterol: 154 mg/dL (ref 0–200)
HDL: 67 mg/dL (ref >40)
LDL Cholesterol: 76 mg/dL (ref 0–99)
Total CHOL/HDL Ratio: 2.3 RATIO
Triglycerides: 55 mg/dL (ref <150)
VLDL: 11 mg/dL (ref 0–40)

## 2021-03-31 MED ORDER — MIDODRINE HCL 5 MG PO TABS
5.0000 mg | ORAL_TABLET | Freq: Three times a day (TID) | ORAL | Status: DC
Start: 2021-03-31 — End: 2021-04-01
  Administered 2021-03-31 – 2021-04-01 (×2): 5 mg via ORAL
  Filled 2021-03-31 (×2): qty 1

## 2021-03-31 MED ORDER — FLUOXETINE HCL 10 MG PO CAPS
10.0000 mg | ORAL_CAPSULE | Freq: Every day | ORAL | Status: DC
  Administered 2021-03-31 – 2021-04-02 (×3): 10 mg via ORAL
  Filled 2021-03-31 (×3): qty 1

## 2021-03-31 MED ORDER — QUETIAPINE FUMARATE ER 50 MG PO TB24
50.0000 mg | ORAL_TABLET | Freq: Every day | ORAL | Status: DC
  Administered 2021-03-31 – 2021-04-01 (×3): 50 mg via ORAL
  Filled 2021-03-31 (×4): qty 1

## 2021-03-31 MED ORDER — FERROUS SULFATE 325 (65 FE) MG PO TABS
325.0000 mg | ORAL_TABLET | Freq: Every day | ORAL | Status: DC
  Administered 2021-03-31 – 2021-04-02 (×3): 325 mg via ORAL
  Filled 2021-03-31 (×3): qty 1

## 2021-03-31 MED ORDER — PRAVASTATIN SODIUM 40 MG PO TABS
40.0000 mg | ORAL_TABLET | Freq: Every day | ORAL | Status: DC
Start: 2021-03-31 — End: 2021-03-31
  Administered 2021-03-31: 40 mg via ORAL
  Filled 2021-03-31: qty 1

## 2021-03-31 MED ORDER — TAMSULOSIN HCL 0.4 MG PO CAPS
0.4000 mg | ORAL_CAPSULE | Freq: Every day | ORAL | Status: DC
  Administered 2021-03-31 – 2021-04-01 (×3): 0.4 mg via ORAL
  Filled 2021-03-31 (×3): qty 1

## 2021-03-31 MED ORDER — LEVOTHYROXINE SODIUM 25 MCG PO TABS
125.0000 ug | ORAL_TABLET | Freq: Every day | ORAL | Status: DC
  Administered 2021-03-31 – 2021-04-02 (×3): 125 ug via ORAL
  Filled 2021-03-31 (×3): qty 1

## 2021-03-31 MED ORDER — CARBAMAZEPINE 200 MG PO TABS
200.0000 mg | ORAL_TABLET | Freq: Two times a day (BID) | ORAL | Status: DC
  Administered 2021-03-31 – 2021-04-02 (×6): 200 mg via ORAL
  Filled 2021-03-31 (×7): qty 1

## 2021-03-31 MED ORDER — ACETAMINOPHEN 325 MG PO TABS: 650.0000 mg | ORAL_TABLET | Freq: Four times a day (QID) | ORAL | Status: DC | PRN

## 2021-03-31 MED ORDER — PRAVASTATIN SODIUM 40 MG PO TABS
80.0000 mg | ORAL_TABLET | Freq: Every day | ORAL | Status: DC
  Administered 2021-04-01 – 2021-04-02 (×2): 80 mg via ORAL
  Filled 2021-03-31 (×2): qty 2

## 2021-03-31 MED ORDER — IOHEXOL 350 MG/ML SOLN
75.0000 mL | Freq: Once | INTRAVENOUS | Status: AC | PRN
  Administered 2021-03-31: 75 mL via INTRAVENOUS

## 2021-03-31 MED ORDER — ASPIRIN EC 325 MG PO TBEC
325.0000 mg | DELAYED_RELEASE_TABLET | Freq: Every day | ORAL | Status: DC
  Administered 2021-04-01 – 2021-04-02 (×2): 325 mg via ORAL
  Filled 2021-03-31 (×2): qty 1

## 2021-03-31 MED ORDER — CARBIDOPA-LEVODOPA 25-100 MG PO TABS
2.0000 | ORAL_TABLET | Freq: Three times a day (TID) | ORAL | Status: DC
  Administered 2021-03-31 – 2021-04-02 (×8): 2 via ORAL
  Filled 2021-03-31 (×8): qty 2

## 2021-03-31 NOTE — Progress Notes (Signed)
? ?  Echocardiogram ?2D Echocardiogram has been performed. ? ?Victor Osborne ?03/31/2021, 11:29 AM ?

## 2021-03-31 NOTE — Progress Notes (Signed)
?   03/31/21 1200  ?PT Visit Information  ?Last PT Received On 03/31/21  ? ?Evaluation completed and full note to follow.  Pt with incr numbness and tingling left hemibody during session.  Notified nurse and they were notifying MD when PT left room.   ?Junious Ragone M,PT ?Acute Rehab Services ?[phone] ?[phone] (pager)  ? ?

## 2021-03-31 NOTE — Progress Notes (Signed)
?PROGRESS NOTE ? ? ? ?Victor Osborne  YBO:[id number] DOB: Mar 18, 1932 DOA: 03/30/2021 ?PCP: [name], [place]  ? ? ?Brief Narrative:  ?88 year old with history of sick sinus syndrome status post pacemaker, Parkinson's disease, bipolar disorder, hypertension hyperlipidemia presented from with left lower extremity weakness causing him to fall and hit the back of his head on a wall.  Continue to fail numbness and tingling of his legs.  In the emergency room hemodynamically stable, EKG with AV paced rhythm.  Skeletal survey negative.  CT head was normal.  MRI incompatible pacemaker.  Admitted with TIA. ? ? ?Assessment & Plan: ?  ?TIA/transient episodic left lower leg weakness: ?Clinical findings, episodic left leg weakness. ?CT head findings, no abnormal findings. ?MRI of the brain, unable to do.  Incompatible MRI present. ?CT angio of the head and neck, no large vessel occlusion or stenosis.  Mild degenerative changes. ?2D echocardiogram, done.  Results pending. ?Antiplatelet therapy, not on any antiplatelet therapy at home.  He started on aspirin and Plavix. ?LDL 76.  Already on a statin.  At goal. ?Hemoglobin A1c, pending. ?Therapy recommendations, pending. ?To be seen by PT/OT.  To be followed by neurology. ? ?Hypothyroidism: Clinically euthyroid on current dose of levothyroxine.  Continued. ? ?Parkinson's disease: On Sinemet.  Continue. ? ?Complete heart block: Status post pacemaker.  Stable. ? ? ?DVT prophylaxis: enoxaparin (LOVENOX) injection 40 mg Start: 03/30/21 2200 ? ? ?Code Status: Full code ?Family Communication: Significant other at the bedside ?Disposition Plan: Status is: Inpatient ?Remains inpatient appropriate because: Significant findings, episodic left leg weakness, stroke work-up ?  ? ? ?Consultants:  ?Neurology ? ?Procedures:  ?None ? ?Antimicrobials:  ?None ? ? ?Subjective: ?Patient seen and examined.  No overnight events.  In the morning rounds, he denied any complaints.  He reported  some return of weakness on the left leg later in the day.  No new neurological findings. ? ?Objective: ?Vitals:  ? 03/31/21 0319 03/31/21 0816 03/31/21 1027 03/31/21 1137  ?BP: (!) 104/54 (!) 143/66 108/65 (!) 102/51  ?Pulse: 66 (!) 59 60 60  ?Resp: '18  14 18  '$ ?Temp: 98.4 ?F (36.9 ?C) 98.1 ?F (36.7 ?C) (!) 97.5 ?F (36.4 ?C)   ?TempSrc:  Oral Oral   ?SpO2: 97% 96% 96% 94%  ?Weight:      ?Height:      ? ? ?Intake/Output Summary (Last 24 hours) at 03/31/2021 1230 ?Last data filed at 03/31/2021 0600 ?Gross per 24 hour  ?Intake --  ?Output 1125 ml  ?Net -1125 ml  ? ?Filed Weights  ? 03/30/21 1233  ?Weight: 90.7 kg  ? ? ?Examination: ? ?General exam: Appears calm and comfortable  ?Respiratory system: Clear to auscultation. Respiratory effort normal.  Pacemaker present. ?Cardiovascular system: S1 & S2 heard, RRR. No JVD, murmurs, rubs, gallops or clicks. No pedal edema. ?Gastrointestinal system: Abdomen is nondistended, soft and nontender. No organomegaly or masses felt. Normal bowel sounds heard. ?Central nervous system: Alert and oriented.  ?Unable to elicit any motor or sensory weakness.  Gait deferred. ? ? ? ? ?Data Reviewed: I have personally reviewed following labs and imaging studies ? ?CBC: ?Recent Labs  ?Lab 03/30/21 ?1255 03/30/21 ?1350  ?WBC 7.7 8.2  ?NEUTROABS  --  7.0  ?HGB 14.3 14.5  ?HCT 43.2 44.0  ?MCV 98.4 99.1  ?PLT 165 178  ? ?Basic Metabolic Panel: ?Recent Labs  ?Lab 03/30/21 ?1255  ?NA 140  ?K 4.4  ?CL 107  ?CO2 25  ?GLUCOSE 108*  ?  BUN 25*  ?CREATININE 1.30*  ?CALCIUM 9.1  ? ?GFR: ?Estimated Creatinine Clearance: 50.1 mL/min (A) (by C-G formula based on SCr of 1.3 mg/dL (H)). ?Liver Function Tests: ?No results for input(s): AST, ALT, ALKPHOS, BILITOT, PROT, ALBUMIN in the last 168 hours. ?No results for input(s): LIPASE, AMYLASE in the last 168 hours. ?No results for input(s): AMMONIA in the last 168 hours. ?Coagulation Profile: ?Recent Labs  ?Lab 03/30/21 ?1350  ?INR 1.1  ? ?Cardiac Enzymes: ?No results  for input(s): CKTOTAL, CKMB, CKMBINDEX, TROPONINI in the last 168 hours. ?BNP (last 3 results) ?No results for input(s): PROBNP in the last 8760 hours. ?HbA1C: ?No results for input(s): HGBA1C in the last 72 hours. ?CBG: ?Recent Labs  ?Lab 03/30/21 ?1252  ?GLUCAP 92  ? ?Lipid Profile: ?Recent Labs  ?  03/31/21 ?0147  ?CHOL 154  ?HDL 67  ?[place] 76  ?TRIG 55  ?CHOLHDL 2.3  ? ?Thyroid Function Tests: ?No results for input(s): TSH, T4TOTAL, FREET4, T3FREE, THYROIDAB in the last 72 hours. ?Anemia Panel: ?No results for input(s): VITAMINB12, FOLATE, FERRITIN, TIBC, IRON, RETICCTPCT in the last 72 hours. ?Sepsis Labs: ?No results for input(s): PROCALCITON, LATICACIDVEN in the last 168 hours. ? ?Recent Results (from the past 240 hour(s))  ?Resp Panel by RT-PCR (Flu A&B, Covid) Nasopharyngeal Swab     Status: None  ? Collection Time: 03/30/21  2:00 PM  ? Specimen: Nasopharyngeal Swab; Nasopharyngeal(NP) swabs in vial transport medium  ?Result Value Ref Range Status  ? SARS Coronavirus 2 by RT PCR NEGATIVE NEGATIVE Final  ?  Comment: (NOTE) ?SARS-CoV-2 target nucleic acids are NOT DETECTED. ? ?The SARS-CoV-2 RNA is generally detectable in upper respiratory ?specimens during the acute phase of infection. The lowest ?concentration of SARS-CoV-2 viral copies this assay can detect is ?138 copies/mL. A negative result does not preclude SARS-Cov-2 ?infection and should not be used as the sole basis for treatment or ?other patient management decisions. A negative result may occur with  ?improper specimen collection/handling, submission of specimen other ?than nasopharyngeal swab, presence of viral mutation(s) within the ?areas targeted by this assay, and inadequate number of viral ?copies(<138 copies/mL). A negative result must be combined with ?clinical observations, patient history, and epidemiological ?information. The expected result is Negative. ? ?Fact Sheet for Patients:  ?EntrepreneurPulse.com.au ? ?Fact  Sheet for Healthcare Providers:  ?IncredibleEmployment.be ? ?This test is no t yet approved or cleared by the [place] FDA and  ?has been authorized for detection and/or diagnosis of SARS-CoV-2 by ?FDA under an Emergency Use Authorization (EUA). This EUA will remain  ?in effect (meaning this test can be used) for the duration of the ?COVID-19 declaration under Section 564(b)(1) of the Act, 21 ?U.S.C.section 360bbb-3(b)(1), unless the authorization is terminated  ?or revoked sooner.  ? ? ?  ? Influenza A by PCR NEGATIVE NEGATIVE Final  ? Influenza B by PCR NEGATIVE NEGATIVE Final  ?  Comment: (NOTE) ?The Xpert Xpress SARS-CoV-2/FLU/RSV plus assay is intended as an aid ?in the diagnosis of influenza from Nasopharyngeal swab specimens and ?should not be used as a sole basis for treatment. Nasal washings and ?aspirates are unacceptable for Xpert Xpress SARS-CoV-2/FLU/RSV ?testing. ? ?Fact Sheet for Patients: ?EntrepreneurPulse.com.au ? ?Fact Sheet for Healthcare Providers: ?IncredibleEmployment.be ? ?This test is not yet approved or cleared by the [place] FDA and ?has been authorized for detection and/or diagnosis of SARS-CoV-2 by ?FDA under an Emergency Use Authorization (EUA). This EUA will remain ?in effect (meaning this test can be used)  for the duration of the ?COVID-19 declaration under Section 564(b)(1) of the Act, 21 U.S.C. ?section 360bbb-3(b)(1), unless the authorization is terminated or ?revoked. ? ?Performed at [facility], [address] [place] ?[place],  [address] ?  ?  ? ? ? ? ? ?Radiology Studies: ?CT ANGIO HEAD NECK W WO CM ? ?Result Date: 03/31/2021 ?CLINICAL DATA:  88 year old male with left lower extremity weakness. EXAM: CT ANGIOGRAPHY HEAD AND NECK TECHNIQUE: Multidetector CT imaging of the head and neck was performed using the standard protocol during bolus administration of intravenous contrast. Multiplanar CT image  reconstructions and MIPs were obtained to evaluate the vascular anatomy. Carotid stenosis measurements (when applicable) are obtained utilizing NASCET criteria, using the distal internal carotid diameter

## 2021-03-31 NOTE — Assessment & Plan Note (Signed)
S/p pacemaker. Stable  ?

## 2021-03-31 NOTE — Evaluation (Signed)
Physical Therapy Evaluation ?Patient Details ?Name: Victor Osborne [facility] ?MRN: [id number] ?DOB: [birth year] ?Today's Date: 03/31/2021 ? ?History of Present Illness ? 88 year old admitted 3/14  presented with left lower extremity weakness causing him to fall and hit the back of his head on a wall.  Continue to have numbness and tingling of his legs.  CT head was normal.  MRI incompatible pacemaker.  Admitted with TIA.  PMH:  sick sinus syndrome status post pacemaker, Parkinson's disease, bipolar disorder, hypertension hyperlipidemia  ?Clinical Impression ? Pt admitted with above diagnosis. Pt had episode during ambulation where his left hemibody had incr numbness and tingling. Notified nurse who notified MD as well. Will progress pt as able.  Pt currently with functional limitations due to the deficits listed below (see PT Problem List). Pt will benefit from skilled PT to increase their independence and safety with mobility to allow discharge to the venue listed below.      ?   ? ?Recommendations for follow up therapy are one component of a multi-disciplinary discharge planning process, led by the attending physician.  Recommendations may be updated based on patient status, additional functional criteria and insurance authorization. ? ?Follow Up Recommendations Home health PT (Depends on symptoms resolution) ? ?  ?Assistance Recommended at Discharge Set up Supervision/Assistance  ?Patient can return home with the following ? A little help with walking and/or transfers ? ?  ?Equipment Recommendations None recommended by PT  ?Recommendations for Other Services ?    ?  ?Functional Status Assessment Patient has had a recent decline in their functional status and demonstrates the ability to make significant improvements in function in a reasonable and predictable amount of time.  ? ?  ?Precautions / Restrictions Precautions ?Precautions: Fall ?Restrictions ?Weight Bearing Restrictions: No  ? ?  ? ?Mobility ? Bed  Mobility ?Overal bed mobility: Independent ?  ?  ?  ?  ?  ?  ?  ?  ? ?Transfers ?Overall transfer level: Needs assistance ?Equipment used: Rolling walker (2 wheels) ?Transfers: Sit to/from Stand ?Sit to Stand: Min guard, Min assist ?  ?  ?  ?  ?  ?General transfer comment: Pt needed steadying assist upon standing. ?  ? ?Ambulation/Gait ?Ambulation/Gait assistance: Min assist, Mod assist ?Gait Distance (Feet): 35 Feet ?Assistive device: Rolling walker (2 wheels) ?Gait Pattern/deviations: Decreased stride length, Decreased step length - left, Decreased stance time - left, Decreased weight shift to left, Decreased dorsiflexion - left, Knee flexed in stance - left ?  ?Gait velocity interpretation: 1.31 - 2.62 ft/sec, indicative of limited community ambulator ?  ?General Gait Details: Pt initially took steps normally but after about 5 feet, pt began dragging left foot.  With cues, pt was able to pick the left LE up a little better but pt was overcompensating and picking it way up. Pt was able to turn to head back to bed however when almost back, pt reports that his left hemibody had incr numbness and tingling.  Notified Nursing. ? ?Stairs ?  ?  ?  ?  ?  ? ?Wheelchair Mobility ?  ? ?Modified Rankin (Stroke Patients Only) ?Modified Rankin (Stroke Patients Only) ?Pre-Morbid Rankin Score: No symptoms ?Modified Rankin: Moderately severe disability ? ?  ? ?Balance Overall balance assessment: Needs assistance ?Sitting-balance support: No upper extremity supported, Feet supported ?Sitting balance-Leahy Scale: Fair ?  ?  ?Standing balance support: Bilateral upper extremity supported, During functional activity ?Standing balance-Leahy Scale: Poor ?Standing balance comment: relies on UE support ?  ?  ?  ?  ?  ?  ?  ?  ?  ?  ?  ?   ? ? ? ?  Pertinent Vitals/Pain Pain Assessment ?Pain Assessment: No/denies pain  ? ? ?Home Living Family/patient expects to be discharged to:: Private residence ?Living Arrangements: Alone ?Available Help  at Discharge: Family;Available PRN/intermittently ?Type of Home: Independent living facility [facility]) ?Home Access: Elevator ?  ?  ?  ?Home Layout: Multi-level;Other (Comment) (Pt lives on 3rd floor) ?Home Equipment: Cane - single point;Rollator (4 wheels);Wheelchair - manual;Grab bars - tub/shower;Grab bars - toilet ?   ?  ?Prior Function Prior Level of Function : Driving;Other (comment);Independent/Modified Independent (Goes out to eat every day and drives self) ?  ?  ?  ?  ?  ?  ?  ?  ?  ? ? ?Hand Dominance  ? Dominant Hand: Right ? ?  ?Extremity/Trunk Assessment  ? Upper Extremity Assessment ?Upper Extremity Assessment: Defer to OT evaluation;LUE deficits/detail ?LUE Deficits / Details: numb/tingling with activity ?  ? ?Lower Extremity Assessment ?Lower Extremity Assessment: LLE deficits/detail ?LLE Deficits / Details: Transient numbness and tingling with activity, grossly 4/5 ?LLE Sensation: decreased light touch;decreased proprioception ?  ? ?Cervical / Trunk Assessment ?Cervical / Trunk Assessment: Normal  ?Communication  ? Communication: No difficulties  ?Cognition Arousal/Alertness: Awake/alert ?Behavior During Therapy: [facility] for tasks assessed/performed ?Overall Cognitive Status: Within Functional Limits for tasks assessed ?  ?  ?  ?  ?  ?  ?  ?  ?  ?  ?  ?  ?  ?  ?  ?  ?  ?  ?  ? ?  ?General Comments   ? ?  ?Exercises General Exercises - Lower Extremity ?Ankle Circles/Pumps: AROM, Both, 5 reps, Supine ?Long Arc Quad: AROM, Both, 10 reps, Seated ?Hip Flexion/Marching: AROM, Both, 10 reps, Seated  ? ?Assessment/Plan  ?  ?PT Assessment Patient needs continued PT services  ?PT Problem List Decreased balance;Decreased mobility;Decreased activity tolerance;Decreased knowledge of use of DME;Decreased safety awareness;Decreased knowledge of precautions ? ?   ?  ?PT Treatment Interventions DME instruction;Gait training;Functional mobility training;Therapeutic activities;Therapeutic exercise;Balance  training;Patient/family education   ? ?PT Goals (Current goals can be found in the Care Plan section)  ?Acute Rehab PT Goals ?Patient Stated Goal: to go home ?PT Goal Formulation: With patient ?Time For Goal Achievement: 04/14/21 ?Potential to Achieve Goals: Good ? ?  ?Frequency Min 4X/week ?  ? ? ?Co-evaluation   ?  ?  ?  ?  ? ? ?  ?AM-PAC PT "6 Clicks" Mobility  ?Outcome Measure Help needed turning from your back to your side while in a flat bed without using bedrails?: None ?Help needed moving from lying on your back to sitting on the side of a flat bed without using bedrails?: None ?Help needed moving to and from a bed to a chair (including a wheelchair)?: A Little ?Help needed standing up from a chair using your arms (e.g., wheelchair or bedside chair)?: A Little ?Help needed to walk in hospital room?: A Lot ?Help needed climbing 3-5 steps with a railing? : A Lot ?6 Click Score: 18 ? ?  ?End of Session Equipment Utilized During Treatment: Gait belt ?Activity Tolerance: Patient limited by fatigue;Other (comment);Treatment limited secondary to medical complications (Comment) (incr stroke symptoms) ?Patient left: in bed;with call bell/phone within reach;with bed alarm set;with family/visitor present (with nurse) ?Nurse Communication: Mobility status;Other (comment) (pt with stroke symptoms) ?PT Visit Diagnosis: Unsteadiness on feet (R26.81);Muscle weakness (generalized) (M62.81) ?  ? ?Time: 1010-1030 ?PT Time Calculation (min) (ACUTE ONLY): 20 min ? ? ?Charges:   PT Evaluation ?$PT Eval Moderate Complexity:  1 Mod ?  ?  ?   ? ? ?Kain Milosevic M,PT ?Acute Rehab Services ?[phone] ?[phone] (pager)  ? ?Romano Stigger F Kalonji Zurawski ?03/31/2021, 1:53 PM ? ?

## 2021-03-31 NOTE — H&P (Signed)
?History and Physical  ? ? ?Patient: Victor Osborne GGE:[id number] DOB: [birth year] ?DOA: 03/30/2021 ?DOS: the patient was seen and examined on 03/31/2021 ?PCP: [name], [place]  ?Patient coming from: [place] ? ?Chief Complaint:  ?Chief Complaint  ?Patient presents with  ? Fall  ? Extremity Weakness  ? ?HPI: Victor Osborne is a 88 y.o. male with medical history significant of sick sinus syndrome/CHB s/p pacemaker, Parkinson's disease, bipolar disorder, hypertension and hyperlipidemia who presents from outside ED for stroke work-up. ? ?Patient lives alone and around 9:30 AM this morning he was putting on pants and felt acute left lower extremity weakness causing him to fall and hit the back of his head on a wall.  He continues to feel numbness and tingling of his leg.  Denies any facial droop or speech difficulties.  No headaches or blurred vision.  No previous history of stroke.  Has remote history of tobacco use about 30 years ago.  No alcohol or illicit drug use. ? ?In the ED, he was afebrile and normotensive on room air.  CBC was unremarkable.  BMP significant for elevated creatinine of 1.3 from prior of 1.2, CBG of 108.  UDS was negative.  EtOH of less than 10. ?EKG on my review shows AV paced rhythm. ? ?Left hand x-ray was done secondary to his fall and showed no acute fracture with only findings of osteoarthritis. ? ?CT of the head was negative.  Unfortunately, his pacemaker was confirmed by MRI tech at outside ED to be incompatible for MRI imaging.  Neurology Dr. Quinn Axe was consulted by ED physician Dr. Gilford Raid who recommended admission for further stroke work-up and to start both aspirin and Plavix. ? ?Review of Systems: As mentioned in the history of present illness. All other systems reviewed and are negative. ?Past Medical History:  ?Diagnosis Date  ? Bipolar 1 disorder ([place])   ? COPD (chronic obstructive pulmonary disease) ([place])   ? Depression   ? First  degree AV block   ? Hypertension   ? pt denies but was in medical record  ? Hypoglycemia, unspecified   ? Myalgia and myositis, unspecified   ? Obesity   ? Pneumonia 1970  ? Skin cancer (melanoma) ([place])   ? Thyroid disease   ? Unspecified hypothyroidism   ? UPJ obstruction, congenital   ? ?Past Surgical History:  ?Procedure Laterality Date  ? APPENDECTOMY  1939  ? malignant melanoma removed from right forehead  2008  ? PERMANENT PACEMAKER INSERTION N/A 06/19/2013  ? Procedure: PERMANENT PACEMAKER INSERTION;  Surgeon: Evans Lance, MD;  Location: [facility] CATH LAB;  Service: Cardiovascular;  Laterality: N/A;  ? right big toe  surgery  2009  ? TRANSURETHRAL RESECTION OF PROSTATE  02/04/2011  ? Procedure: TRANSURETHRAL RESECTION OF THE PROSTATE (TURP);  Surgeon: Dutch Gray, MD;  Location: [facility] ORS;  Service: Urology;  Laterality: N/A;  ? URETHRA SURGERY  2010  ? reconstruction  ? ?Social History:  reports that he has quit smoking. His smoking use included cigarettes. He has a 80.00 pack-year smoking history. He has never used smokeless tobacco. He reports that he does not drink alcohol and does not use drugs. ? ?No Known Allergies ? ?Family History  ?Problem Relation Age of Onset  ? Cerebral aneurysm Mother   ? Lung cancer Father   ? Drug abuse Daughter   ? ? ?Prior to Admission medications   ?Medication Sig Start Date End Date Taking? Authorizing Provider  ?  acetaminophen (TYLENOL) 325 MG tablet Take 650 mg by mouth every 6 (six) hours as needed.   Yes [provider]  ?alclomethasone (ACLOVATE) 0.05 % ointment Apply 1 application topically 2 (two) times daily as needed (for rash).   Yes [provider]  ?Bacillus Coagulans-Inulin (PROBIOTIC-PREBIOTIC) 1-250 BILLION-MG CAPS Take 1 capsule by mouth daily.   Yes [provider]  ?Bee Pollen 550 MG CAPS Take 550 mg by mouth daily.   Yes [provider]  ?carbamazepine (TEGRETOL) 200 MG tablet Take 1 tablet (200 mg total) by mouth 2 (two) times  daily. 12/03/20  Yes Thayer Headings, PMHNP  ?carbidopa-levodopa (SINEMET IR) 25-100 MG tablet Take 2 tablets by mouth 3 (three) times daily. 03/26/21  Yes Tat, Eustace Quail, DO  ?cholecalciferol (VITAMIN D) 1000 UNITS tablet Take 2,000 Units by mouth daily.   Yes [provider]  ?Coenzyme Q10 (CO Q 10 PO) Take 1 capsule by mouth daily.   Yes [provider]  ?desoximetasone (TOPICORT) 0.25 % cream Apply 1 application topically 2 (two) times daily.   Yes [provider]  ?ferrous sulfate 324 (65 Fe) MG TBEC Take 324 mg by mouth.   Yes [provider]  ?Flaxseed, Linseed, (FLAX SEED OIL) 1300 MG CAPS Take 1 capsule by mouth daily.   Yes [provider]  ?FLUoxetine (PROZAC) 10 MG capsule Take 1 capsule (10 mg total) by mouth daily. 12/03/20  Yes Thayer Headings, PMHNP  ?levothyroxine (LEVOTHROID) 125 MCG tablet Take 1 tablet (125 mcg total) by mouth daily before breakfast. 01/12/21  Yes [name], [place]  ?MAGNESIUM PO Take 1 capsule by mouth daily.   Yes [provider]  ?Multiple Vitamin (MULITIVITAMIN WITH MINERALS) TABS Take 1 tablet by mouth daily.   Yes [provider]  ?Omega-3 Fatty Acids (OMEGA-3 CF PO) Take 1,200 mg by mouth daily.   Yes [provider]  ?POTASSIUM GLUCONATE PO Take 1 capsule by mouth daily.   Yes [provider]  ?pravastatin (PRAVACHOL) 40 MG tablet Take 1 tablet (40 mg total) by mouth daily. 01/12/21  Yes [name], [place]  ?QUEtiapine (SEROQUEL XR) 50 MG TB24 24 hr tablet Take 1 tablet (50 mg total) by mouth at bedtime. 12/03/20  Yes Thayer Headings, PMHNP  ?tamsulosin (FLOMAX) 0.4 MG CAPS capsule Take 0.4 mg by mouth at bedtime. 02/15/21  Yes [provider]  ?TURMERIC CURCUMIN PO Take 1 capsule by mouth daily.   Yes [provider]  ? ? ?Physical Exam: ?Vitals:  ? 03/30/21 1745 03/30/21 1815 03/30/21 1941 03/30/21 2342  ?BP: 131/63 (!) 127/92 (!) 155/74 (!) 122/59   ?Pulse: 64 63 (!) 59 66  ?Resp: '17 14 18 17  '$ ?Temp:   98.1 ?F (36.7 ?C) 97.9 ?F (36.6 ?C)  ?TempSrc:   Oral Oral  ?SpO2: 100% 100% 100% 96%  ?Weight:      ?Height:      ?Constitutional: NAD, calm, comfortable, elderly male appearing younger than stated age laying flat in bed initially asleep ?Eyes: PERRL, lids and conjunctivae normal ?ENMT: Mucous membranes are moist. ?Neck: normal, supple ?Respiratory: clear to auscultation bilaterally, no wheezing, no crackles. Normal respiratory effort. No accessory muscle use.  ?Cardiovascular: Regular rate and rhythm, no murmurs / rubs / gallops. No extremity edema. 2+ pedal pulses.  ?Abdomen: no tenderness, no masses palpated. Bowel sounds positive.  ?Musculoskeletal: no clubbing / cyanosis. No joint deformity upper and lower extremities. Good ROM, no contractures. Normal muscle tone.  ?Skin:  Superficial abrasion of occipital scalp with dried blood noted on pillow ?Neurologic: CN 2-12 grossly intact.  Strength 5/5 in all 4.  Equal smile, symmetric shoulder shrug, intact finger-nose, intact heel-to-shin.  Endorse decree sensation of the lower extremity.  Noted to have occasional repetitive chewing motion of the right side of his mouth possibly tardive dyskinesia. ?Psychiatric: Normal judgment and insight. Alert and oriented x 3. Normal mood.  Occasionally telling jokes. ?Data Reviewed: ? ?See HPI ? ?Assessment and Plan: ?* Left leg weakness ?-Presented with left lower extremity weakness and paresthesia with current ongoing symptom ?- Negative CT head and unfortunately pacemaker is incompatible with MRI.  Given ongoing symptoms, presumed to have CVA and has been started on aspirin and Plavix by neurology ?-Continue work-up with CTA head and neck ?-Obtain echocardiogram  ?-Obtain A1c and lipids -continue pravastatin and can increase to high intensity if LDL >75 ?-PT/OT/SLT ?-Frequent neuro checks and keep on telemetry ?-Allow for permissive hypertension with blood pressure  treatment as needed only if systolic goes above 093 ? ?Hypothyroidism ?- Continue levothyroxine ? ?Parkinson disease ([place]) ?-continue Sinemet ? ?Sinoatrial node dysfunction (HCC) ?S/p pacemaker. Stable  ? ? ? ? ? Ad

## 2021-03-31 NOTE — Assessment & Plan Note (Signed)
-  continue Sinemet ?

## 2021-03-31 NOTE — TOC Initial Note (Signed)
Transition of Care (TOC) - Initial/Assessment Note  ? ? ?Patient Details  ?Name: Victor Osborne [facility] ?MRN: [id number] ?Date of Birth: 10-03-1932 ? ?Transition of Care (TOC) CM/SW Contact:    ?Pollie Friar, RN ?Phone Number: ?03/31/2021, 4:29 PM ? ?Clinical Narrative:                 ?Patient lives at [place] at [facility]. He denies any issues with home medications. Pt still drives self to appointments.  ?Recommendations for home therapy. Cm called Suanne Marker at [facility] and they use [place] for [facility] services. CM has sent the referral. Information on the AVS. ?Pt has transportation home when medically ready. ?TOC following. ? ?Expected Discharge Plan: [place] ?Barriers to Discharge: Continued Medical Work up ? ? ?Patient Goals and CMS Choice ?  ?CMS Medicare.gov Compare Post Acute Care list provided to:: Patient ?Choice offered to / list presented to : Patient ? ?Expected Discharge Plan and Services ?Expected Discharge Plan: [place] ?  ?Discharge Planning Services: CM Consult ?Post Acute Care Choice: Home Health ?Living arrangements for the past 2 months: Apartment ?                ?  ?  ?  ?  ?  ?HH Arranged: PT, OT ?[place] Agency: [place] ?Date HH Agency Contacted: 03/31/21 ?  ?Representative spoke with at [place]: Tommi Rumps ? ?Prior Living Arrangements/Services ?Living arrangements for the past 2 months: Apartment ?Lives with:: Self ?Patient language and need for interpreter reviewed:: Yes ?Do you feel safe going back to the place where you live?: Yes      ?  ?  ?Current home services: DME (rollator) ?Criminal Activity/Legal Involvement Pertinent to Current Situation/Hospitalization: No - Comment as needed ? ?Activities of Daily Living ?Home Assistive Devices/Equipment: None ?ADL Screening (condition at time of admission) ?Patient's cognitive ability adequate to safely complete daily activities?: Yes ?Is the patient deaf or have difficulty  hearing?: No ?Does the patient have difficulty seeing, even when wearing glasses/contacts?: No ?Does the patient have difficulty concentrating, remembering, or making decisions?: No ?Patient able to express need for assistance with ADLs?: Yes ?Does the patient have difficulty dressing or bathing?: No ?Independently performs ADLs?: Yes (appropriate for developmental age) ?Does the patient have difficulty walking or climbing stairs?: No ?Weakness of Legs: Left ?Weakness of Arms/Hands: None ? ?Permission Sought/Granted ?  ?  ?   ?   ?   ?   ? ?Emotional Assessment ?Appearance:: Appears stated age ?Attitude/Demeanor/Rapport: Engaged ?Affect (typically observed): Accepting ?Orientation: : Oriented to Self, Oriented to Place, Oriented to  Time, Oriented to Situation ?  ?Psych Involvement: No (comment) ? ?Admission diagnosis:  Left leg weakness [R29.898] ?Patient Active Problem List  ? Diagnosis Date Noted  ? Cerebral embolism with cerebral infarction 03/31/2021  ? Left leg weakness 03/30/2021  ? Chronic post-traumatic stress disorder 12/05/2017  ? Bipolar I disorder, current or most recent episode depressed, in partial remission ([place]) 12/05/2017  ? Hypothyroidism 10/11/2016  ? Parkinson disease ([place]) 09/08/2016  ? Pacemaker 09/24/2013  ? Sinoatrial node dysfunction ([place]) 06/19/2013  ? First degree AV block 04/09/2013  ? Allergic rhinitis 03/17/2013  ? Obstructive sleep apnea 12/18/2006  ? COPD mixed type ([place]) 12/18/2006  ? MELANOMA, HX OF 12/18/2006  ? ?PCP:  [name], [place] ?Pharmacy:   ?[place], [place] [facility] ?[place]  [place] [address] ?Phone: [phone] Fax: [phone] ? ?[id number] - [place], [place] - [address] AT [address] [place] ?[address] ?[place] [address] ?Phone: [phone] Fax: [phone] ? ? ? ? ?Social Determinants of Health (SDOH) Interventions ?  ? ?Readmission Risk  Interventions ?No flowsheet data found. ? ? ?

## 2021-03-31 NOTE — Evaluation (Signed)
Occupational Therapy Evaluation Patient Details Name: Victor Osborne MRN: [id number] DOB: [birth year] Today's Date: 03/31/2021   History of Present Illness 88 year old admitted 3/14  presented with left lower extremity weakness causing him to fall and hit the back of his head on a wall.  Continue to have numbness and tingling of his legs.  CT head was normal.  MRI incompatible pacemaker.  Admitted with TIA.  PMH:  sick sinus syndrome status post pacemaker, Parkinson's disease, bipolar disorder, hypertension hyperlipidemia   Clinical Impression   Pt admitted for concerns listed above. PTA pt reported that he was independent with all ADL's and IADL's, including driving everyday. At this time, pt presents with weakness, specifically in LLE when standing and he reports that it goes numb/tingly, which also impairs his static and dynamic balance. When LLE is not numb/tingling, pt is able to take small steps safely using RW, with min guard. Educated on completing all BADL's in sitting until his strength is at baseline. Recommending HHOT at this time to maximize independence and safety in his home. OT will continue to follow acutely.      Recommendations for follow up therapy are one component of a multi-disciplinary discharge planning process, led by the attending physician.  Recommendations may be updated based on patient status, additional functional criteria and insurance authorization.   Follow Up Recommendations  Home health OT    Assistance Recommended at Discharge Set up Supervision/Assistance  Patient can return home with the following A little help with walking and/or transfers;A little help with bathing/dressing/bathroom;Assistance with cooking/housework;Assist for transportation    Functional Status Assessment  Patient has had a recent decline in their functional status and demonstrates the ability to make significant improvements in function in a reasonable and predictable amount of  time.  Equipment Recommendations  None recommended by OT    Recommendations for Other Services       Precautions / Restrictions Precautions Precautions: Fall Restrictions Weight Bearing Restrictions: No      Mobility Bed Mobility Overal bed mobility: Independent                  Transfers Overall transfer level: Needs assistance Equipment used: Rolling walker (2 wheels) Transfers: Sit to/from Stand Sit to Stand: Min guard, Min assist           General transfer comment: Pt needed steadying assist upon standing.      Balance Overall balance assessment: Needs assistance Sitting-balance support: No upper extremity supported, Feet supported Sitting balance-Leahy Scale: Fair     Standing balance support: Bilateral upper extremity supported, During functional activity Standing balance-Leahy Scale: Poor Standing balance comment: relies on UE support                           ADL either performed or assessed with clinical judgement   ADL Overall ADL's : Needs assistance/impaired Eating/Feeding: Set up;Sitting   Grooming: Set up;Sitting   Upper Body Bathing: Min guard;Sitting   Lower Body Bathing: Minimal assistance;Sitting/lateral leans;Sit to/from stand   Upper Body Dressing : Min guard;Sitting   Lower Body Dressing: Minimal assistance;Sitting/lateral leans;Sit to/from stand   Toilet Transfer: Minimal assistance;Ambulation   Toileting- Clothing Manipulation and Hygiene: Minimal assistance;Sitting/lateral lean;Sit to/from stand       Functional mobility during ADLs: Minimal assistance;Rolling walker (2 wheels) General ADL Comments: Pt requiring min A this session, complained of initial numbness with LLE in standing when RN was taking orthostatics, however no further difficulties  with standing and ambulating in room afterwards.     Vision Baseline Vision/History: 1 Wears glasses Ability to See in Adequate Light: 0 Adequate Patient Visual  Report: No change from baseline Vision Assessment?: No apparent visual deficits     Perception     Praxis      Pertinent Vitals/Pain Pain Assessment Pain Assessment: No/denies pain     Hand Dominance Right   Extremity/Trunk Assessment Upper Extremity Assessment Upper Extremity Assessment: Overall WFL for tasks assessed   Lower Extremity Assessment Lower Extremity Assessment: LLE deficits/detail LLE Deficits / Details: Transient numbness and tingling with activity   Cervical / Trunk Assessment Cervical / Trunk Assessment: Normal   Communication Communication Communication: No difficulties   Cognition Arousal/Alertness: Awake/alert Behavior During Therapy: WFL for tasks assessed/performed Overall Cognitive Status: Within Functional Limits for tasks assessed                                       General Comments  Pt had orthostatic drop in BP when standing initially, he complained of LLE numbness/tingling, and mild dizziness, which cleared after standing for 3 mins. BP numbers addressed in vitals tab.    Exercises     Shoulder Instructions      Home Living Family/patient expects to be discharged to:: Private residence Living Arrangements: Alone Available Help at Discharge: Family;Available PRN/intermittently Type of Home: Independent living facility [facility]) Home Access: Elevator     Home Layout: Multi-level;Other (Comment) (Pt lives on 3rd floor)     Bathroom Shower/Tub: Producer, television/film/video: Standard     Home Equipment: Cane - single point;Rollator (4 wheels);Wheelchair - manual;Grab bars - tub/shower;Grab bars - toilet          Prior Functioning/Environment Prior Level of Function : Driving;Other (comment);Independent/Modified Independent (Goes out to eat every day and drives self)                        OT Problem List: Decreased strength;Decreased range of motion;Decreased activity tolerance;Impaired  balance (sitting and/or standing);Decreased safety awareness      OT Treatment/Interventions: Self-care/ADL training;Therapeutic exercise;Energy conservation;DME and/or AE instruction;Therapeutic activities;Patient/family education;Balance training    OT Goals(Current goals can be found in the care plan section) Acute Rehab OT Goals Patient Stated Goal: To get his strength back OT Goal Formulation: With patient Time For Goal Achievement: 04/14/21 Potential to Achieve Goals: Good ADL Goals Pt Will Perform Grooming: with modified independence;standing Pt Will Perform Lower Body Bathing: with modified independence;sitting/lateral leans;sit to/from stand Pt Will Perform Lower Body Dressing: with modified independence;sitting/lateral leans;sit to/from stand Pt Will Transfer to Toilet: with modified independence;ambulating Pt Will Perform Toileting - Clothing Manipulation and hygiene: with modified independence;sitting/lateral leans;sit to/from stand Additional ADL Goal #1: Pt will tolerate standing ADL task for 5 mins with no LoB or knee buckling.  OT Frequency: Min 2X/week    Co-evaluation              AM-PAC OT "6 Clicks" Daily Activity     Outcome Measure Help from another person eating meals?: A Little Help from another person taking care of personal grooming?: A Little Help from another person toileting, which includes using toliet, bedpan, or urinal?: A Little Help from another person bathing (including washing, rinsing, drying)?: A Little Help from another person to put on and taking off regular upper body clothing?: A Little  Help from another person to put on and taking off regular lower body clothing?: A Little 6 Click Score: 18   End of Session Equipment Utilized During Treatment: Gait belt;Rolling walker (2 wheels) Nurse Communication: Mobility status  Activity Tolerance: Patient tolerated treatment well Patient left: in bed;with call bell/phone within reach;with  family/visitor present  OT Visit Diagnosis: Unsteadiness on feet (R26.81);Other abnormalities of gait and mobility (R26.89);Muscle weakness (generalized) (M62.81)                Time: 5284-1324 OT Time Calculation (min): 38 min Charges:  OT General Charges $OT Visit: 1 Visit OT Evaluation $OT Eval Moderate Complexity: 1 Mod OT Treatments $Self Care/Home Management : 8-22 mins $Therapeutic Activity: 8-22 mins  Carriann Hesse H., OTR/L Acute Rehabilitation  Simrit Gohlke Elane Serine Kea 03/31/2021, 6:15 PM

## 2021-03-31 NOTE — Assessment & Plan Note (Signed)
Continue levothyroxine 

## 2021-03-31 NOTE — Assessment & Plan Note (Signed)
-  Presented with left lower extremity weakness and paresthesia with current ongoing symptom ?- Negative CT head and unfortunately pacemaker is incompatible with MRI.  Given ongoing symptoms, presumed to have CVA and has been started on aspirin and Plavix by neurology ?-Continue work-up with CTA head and neck ?-Obtain echocardiogram  ?-Obtain A1c and lipids -continue pravastatin and can increase to high intensity if LDL >75 ?-PT/OT/SLT ?-Frequent neuro checks and keep on telemetry ?-Allow for permissive hypertension with blood pressure treatment as needed only if systolic goes above 300 ?

## 2021-03-31 NOTE — Progress Notes (Addendum)
STROKE TEAM PROGRESS NOTE  ? ?INTERVAL HISTORY ?Wife is at the bedside.  ?Patient and wife report that he does have some orthostasis at home and this morning he got up with PT and left leg weakness got worse. He then lied down and the left weakness improved.   ?Orthostatic vitals ordered.  ? ?Vitals:  ? 03/30/21 1941 03/30/21 2342 03/31/21 0319 03/31/21 0816  ?BP: (!) 155/74 (!) 122/59 (!) 104/54 (!) 143/66  ?Pulse: (!) 59 66 66 (!) 59  ?Resp: '18 17 18   '$ ?Temp: 98.1 ?F (36.7 ?C) 97.9 ?F (36.6 ?C) 98.4 ?F (36.9 ?C) 98.1 ?F (36.7 ?C)  ?TempSrc: Oral Oral  Oral  ?SpO2: 100% 96% 97% 96%  ?Weight:      ?Height:      ? ?CBC:  ?Recent Labs  ?Lab 03/30/21 ?1255 03/30/21 ?1350  ?WBC 7.7 8.2  ?NEUTROABS  --  7.0  ?HGB 14.3 14.5  ?HCT 43.2 44.0  ?MCV 98.4 99.1  ?PLT 165 178  ? ?Basic Metabolic Panel:  ?Recent Labs  ?Lab 03/30/21 ?1255  ?NA 140  ?K 4.4  ?CL 107  ?CO2 25  ?GLUCOSE 108*  ?BUN 25*  ?CREATININE 1.30*  ?CALCIUM 9.1  ? ?Lipid Panel:  ?Recent Labs  ?Lab 03/31/21 ?0147  ?CHOL 154  ?TRIG 55  ?HDL 67  ?CHOLHDL 2.3  ?VLDL 11  ?Heil 76  ? ?HgbA1c: No results for input(s): HGBA1C in the last 168 hours. ?Urine Drug Screen:  ?Recent Labs  ?Lab 03/30/21 ?1508  ?LABOPIA NONE DETECTED  ?COCAINSCRNUR NONE DETECTED  ?LABBENZ NONE DETECTED  ?AMPHETMU NONE DETECTED  ?THCU NONE DETECTED  ?LABBARB NONE DETECTED  ?  ?Alcohol Level  ?Recent Labs  ?Lab 03/30/21 ?1350  ?ETH <10  ? ? ?IMAGING past 24 hours ?CT ANGIO HEAD NECK W WO CM ? ?Result Date: 03/31/2021 ?CLINICAL DATA:  88 year old male with left lower extremity weakness. EXAM: CT ANGIOGRAPHY HEAD AND NECK TECHNIQUE: Multidetector CT imaging of the head and neck was performed using the standard protocol during bolus administration of intravenous contrast. Multiplanar CT image reconstructions and MIPs were obtained to evaluate the vascular anatomy. Carotid stenosis measurements (when applicable) are obtained utilizing NASCET criteria, using the distal internal carotid diameter as  the denominator. RADIATION DOSE REDUCTION: This exam was performed according to the departmental dose-optimization program which includes automated exposure control, adjustment of the mA and/or kV according to patient size and/or use of iterative reconstruction technique. CONTRAST:  65m OMNIPAQUE IOHEXOL 350 MG/ML SOLN COMPARISON:  Head CT yesterday, and earlier. FINDINGS: CT HEAD Brain: No midline shift, ventriculomegaly, mass effect, evidence of mass lesion, intracranial hemorrhage or evidence of cortically based acute infarction. Gray-white matter differentiation appears stable and within normal limits for age. No encephalomalacia identified. Calvarium and skull base: No acute osseous abnormality identified. Left C1/occiput degeneration. Paranasal sinuses: Visualized paranasal sinuses and mastoids are stable and well aerated. Orbits: Postoperative changes to both globes. No acute orbit or scalp soft tissue finding. CTA NECK Skeleton: Advanced cervical spine degeneration. Mild dextroconvex cervical and levoconvex upper thoracic scoliosis. Degenerative interbody ankylosis at C5-C6. No acute osseous abnormality identified. Upper chest: Left chest cardiac pacemaker. Moderate upper lobe emphysema. No superior mediastinal lymphadenopathy. Other neck: Negative. Aortic arch: Calcified aortic atherosclerosis. Mildly tortuous aortic arch. Three vessel arch configuration. Right carotid system: Minimal brachiocephalic artery plaque without stenosis. Mildly tortuous right CCA. Mild soft and calcified plaque at the right ICA origin and bulb without stenosis. Left carotid system: Similar mild plaque and tortuosity  without stat that mildly tortuous left CCA without stenosis. Moderate calcified plaque at the left ICA origin and especially the bulb, but less than 50 % stenosis with respect to the distal vessel. Vertebral arteries: Proximal right subclavian artery calcified plaque with less than 50% stenosis. Normal right  vertebral artery origin. Right vertebral artery is patent to the skull base without stenosis. Mild soft and calcified plaque in the proximal left subclavian artery without stenosis. Minimal plaque at the left vertebral artery origin without stenosis. Mildly dominant appearing left vertebral artery with extrinsic compression in the V2 segment from severe cervical facet arthropathy (series 9, image 454), but no atherosclerotic stenosis to the skull base. CTA HEAD Posterior circulation: Fairly codominant vertebral V4 segments are patent to the vertebrobasilar junction with no significant plaque or stenosis. Patent PICA origins. Patent basilar artery without stenosis. Patent SCA and PCA origins. Small left posterior communicating artery, the right is diminutive or absent. Bilateral P1 and P2 segments are mildly irregular but without stenosis. Bilateral PCA branches are within normal limits. Anterior circulation: Both ICA siphons are patent. Minor left siphon calcified plaque without stenosis. Normal left ophthalmic and posterior communicating artery origins. Mild right siphon calcified plaque without stenosis. Patent carotid termini. Patent MCA and ACA origins. Normal anterior communicating artery. Bilateral ACA branches are normal through the pericallosal arteries. There is some very distal ACA moderate irregularity and stenosis on series 14, image 20. MCA M1 segments are mildly tortuous. Both MCA bifurcations are patent without stenosis. Bilateral MCA branches are within normal limits. Venous sinuses: Patent. Anatomic variants: Mildly dominant left vertebral artery. Review of the MIP images confirms the above findings IMPRESSION: 1. Negative for large vessel occlusion or stenosis. 2. Generally mild for age atherosclerosis in the head and neck. Although moderate stenosis in very distal ACA branches could predispose to ACA motor area ischemia. 3. But stable since yesterday and negative for age CT appearance of the  brain. 4. Aortic Atherosclerosis (ICD10-I70.0) and Emphysema (ICD10-J43.9). 5. Severe cervical spine degeneration. Electronically Signed   By: Genevie Ann M.D.   On: 03/31/2021 06:12  ? ?CT HEAD WO CONTRAST ? ?Result Date: 03/30/2021 ?CLINICAL DATA:  A male at age 4 presents with numbness and decreased sensation in the LEFT lower extremity since 10 o'clock last night. EXAM: CT HEAD WITHOUT CONTRAST TECHNIQUE: Contiguous axial images were obtained from the base of the skull through the vertex without intravenous contrast. RADIATION DOSE REDUCTION: This exam was performed according to the departmental dose-optimization program which includes automated exposure control, adjustment of the mA and/or kV according to patient size and/or use of iterative reconstruction technique. COMPARISON:  July 22, 2016 FINDINGS: Brain: No evidence of acute infarction, hemorrhage, hydrocephalus, extra-axial collection or mass lesion/mass effect. Worsening of atrophy since previous imaging. Signs of chronic microvascular ischemic change. Vascular: No hyperdense vessel or unexpected calcification. Skull: Normal. Negative for fracture or focal lesion. Sinuses/Orbits: No acute finding. Other: None IMPRESSION: 1. No acute intracranial abnormality. 2. Progressive cerebral atrophy and signs of chronic microvascular ischemic change. Electronically Signed   By: Zetta Bills M.D.   On: 03/30/2021 14:13  ? ?DG Chest Portable 1 View ? ?Result Date: 03/30/2021 ?CLINICAL DATA:  Trauma, fall EXAM: PORTABLE CHEST 1 VIEW COMPARISON:  09/28/2020 FINDINGS: Cardiac size is within normal limits. Thoracic aorta is tortuous. There is no focal pulmonary consolidation. There is no significant pleural effusion or pneumothorax. Pacemaker battery is seen in the left infraclavicular region. Overall, no significant interval changes are noted in  the lung fields. IMPRESSION: There are no new infiltrates or signs of pulmonary edema. Electronically Signed   By: Elmer Picker M.D.   On: 03/30/2021 13:53  ? ?DG Hand Complete Left ? ?Result Date: 03/30/2021 ?CLINICAL DATA:  fall, left hand swelling EXAM: LEFT HAND - COMPLETE 3+ VIEW COMPARISON:  None. FINDINGS: There is no evidence

## 2021-03-31 NOTE — Consult Note (Signed)
NEUROLOGY CONSULTATION NOTE   Date of service: March 31, 2021 Patient Name: Victor Osborne MRN:  [id number] DOB:  Jan 24, 1932 Reason for consult: "L leg weakness" Requesting Provider: Clydie Braun, MD _ _ _   _ __   _ __ _ _  __ __   _ __   __ _  History of Present Illness  Garret Mere is a 88 y.o. male with PMH significant for OSA, COPD, HT, HLD, 1st degree AV block, Pneumonia, hypothyroidism, sick sinus syndrome s/p pacemaker, parkinosons disease who presents with L Leg weakness  He woke up this AM and left leg felt off. Had to focus a lot to be able to walk. Got up and tried to get some pants on. Stood on his left leg to get his R leg into the pants and immediately fell. He got up and sat on the couch and felt somewhat better. When he walked again, noted that he was dragging his L foot. He called his family and came to ED at [facility] and was transferred to [facility] for further evaluation and workup.  Reports hx of HTN, HLD, does not smoke(quit when he was 52), no hx of DM2, no hx of afibb, no family hx of strokes.  LKW: 2200 on 03/29/21. mRS: 0 tNKASE/Thrombectomy: not offered, outside the window. NIHSS components Score: Comment  1a Level of Conscious 0[x]  1[]  2[]  3[]      1b LOC Questions 0[x]  1[]  2[]       1c LOC Commands 0[x]  1[]  2[]       2 Best Gaze 0[x]  1[]  2[]       3 Visual 0[x]  1[]  2[]  3[]      4 Facial Palsy 0[x]  1[]  2[]  3[]      5a Motor Arm - left 0[x]  1[]  2[]  3[]  4[]  UN[]    5b Motor Arm - Right 0[x]  1[]  2[]  3[]  4[]  UN[]    6a Motor Leg - Left 0[x]  1[]  2[]  3[]  4[]  UN[]    6b Motor Leg - Right 0[x]  1[]  2[]  3[]  4[]  UN[]    7 Limb Ataxia 0[]  1[x]  2[]  3[]  UN[]     8 Sensory 0[x]  1[]  2[]  UN[]      9 Best Language 0[x]  1[]  2[]  3[]      10 Dysarthria 0[x]  1[]  2[]  UN[]      11 Extinct. and Inattention 0[x]  1[]  2[]       TOTAL: 1      ROS   Constitutional Denies weight loss, fever and chills.   HEENT Denies changes in vision and hearing.   Respiratory Denies SOB and  cough.   CV Denies palpitations and CP   GI Denies abdominal pain, nausea, vomiting and diarrhea.   GU Denies dysuria and urinary frequency.   MSK Denies myalgia and joint pain.   Skin Denies rash and pruritus.   Neurological Denies headache and syncope.   Psychiatric Denies recent changes in mood. Denies anxiety and depression.    Past History   Past Medical History:  Diagnosis Date   Bipolar 1 disorder (HCC)    COPD (chronic obstructive pulmonary disease) (HCC)    Depression    First degree AV block    Hypertension    pt denies but was in medical record   Hypoglycemia, unspecified    Myalgia and myositis, unspecified    Obesity    Pneumonia 1970   Skin cancer (melanoma) (HCC)    Thyroid disease    Unspecified hypothyroidism    UPJ obstruction, congenital    Past Surgical History:  Procedure Laterality Date   APPENDECTOMY  1939   malignant melanoma removed from right forehead  2008   PERMANENT PACEMAKER INSERTION N/A 06/19/2013   Procedure: PERMANENT PACEMAKER INSERTION;  Surgeon: Marinus Maw, MD;  Location: [facility] CATH LAB;  Service: Cardiovascular;  Laterality: N/A;   right big toe  surgery  2009   TRANSURETHRAL RESECTION OF PROSTATE  02/04/2011   Procedure: TRANSURETHRAL RESECTION OF THE PROSTATE (TURP);  Surgeon: Crecencio Mc, MD;  Location: [facility] ORS;  Service: Urology;  Laterality: N/A;   URETHRA SURGERY  2010   reconstruction   Family History  Problem Relation Age of Onset   Cerebral aneurysm Mother    Lung cancer Father    Drug abuse Daughter    Social History   Socioeconomic History   Marital status: Widowed    Spouse name: Not on file   Number of children: Not on file   Years of education: Not on file   Highest education level: Not on file  Occupational History   Occupation: retired  Tobacco Use   Smoking status: Former    Packs/day: 2.00    Years: 40.00    Pack years: 80.00    Types: Cigarettes   Smokeless tobacco: Never  Vaping Use   Vaping Use:  Never used  Substance and Sexual Activity   Alcohol use: No   Drug use: No   Sexual activity: Not Currently  Other Topics Concern   Not on file  Social History Narrative   Work or School: retire Emergency planning/management officer      Home Situation: lives with wife      Spiritual Beliefs: episcopalian      Lifestyle: active with yard work; diet ok   Social Determinants of Corporate investment banker Strain: Not on file  Food Insecurity: Not on file  Transportation Needs: Not on file  Physical Activity: Not on file  Stress: Not on file  Social Connections: Not on file   No Known Allergies  Medications   Medications Prior to Admission  Medication Sig Dispense Refill Last Dose   acetaminophen (TYLENOL) 325 MG tablet Take 650 mg by mouth every 6 (six) hours as needed.   03/30/2021   alclomethasone (ACLOVATE) 0.05 % ointment Apply 1 application topically 2 (two) times daily as needed (for rash).   03/29/2021   Bacillus Coagulans-Inulin (PROBIOTIC-PREBIOTIC) 1-250 BILLION-MG CAPS Take 1 capsule by mouth daily.   03/29/2021   Bee Pollen 550 MG CAPS Take 550 mg by mouth daily.   03/29/2021   carbamazepine (TEGRETOL) 200 MG tablet Take 1 tablet (200 mg total) by mouth 2 (two) times daily. 180 tablet 1 03/29/2021   carbidopa-levodopa (SINEMET IR) 25-100 MG tablet Take 2 tablets by mouth 3 (three) times daily. 108 tablet 0 03/29/2021   cholecalciferol (VITAMIN D) 1000 UNITS tablet Take 2,000 Units by mouth daily.   03/29/2021   Coenzyme Q10 (CO Q 10 PO) Take 1 capsule by mouth daily.   03/29/2021   desoximetasone (TOPICORT) 0.25 % cream Apply 1 application topically 2 (two) times daily.   03/29/2021   ferrous sulfate 324 (65 Fe) MG TBEC Take 324 mg by mouth.   03/29/2021   Flaxseed, Linseed, (FLAX SEED OIL) 1300 MG CAPS Take 1 capsule by mouth daily.   03/29/2021   FLUoxetine (PROZAC) 10 MG capsule Take 1 capsule (10 mg total) by mouth daily. 90 capsule 1 03/29/2021   levothyroxine (LEVOTHROID) 125 MCG tablet Take  1 tablet (125 mcg total) by  mouth daily before breakfast. 90 tablet 3 03/29/2021   MAGNESIUM PO Take 1 capsule by mouth daily.   03/29/2021   Multiple Vitamin (MULITIVITAMIN WITH MINERALS) TABS Take 1 tablet by mouth daily.   03/29/2021   Omega-3 Fatty Acids (OMEGA-3 CF PO) Take 1,200 mg by mouth daily.   03/29/2021   POTASSIUM GLUCONATE PO Take 1 capsule by mouth daily.   03/29/2021   pravastatin (PRAVACHOL) 40 MG tablet Take 1 tablet (40 mg total) by mouth daily. 90 tablet 3 03/29/2021   QUEtiapine (SEROQUEL XR) 50 MG TB24 24 hr tablet Take 1 tablet (50 mg total) by mouth at bedtime. 90 tablet 1 03/29/2021   tamsulosin (FLOMAX) 0.4 MG CAPS capsule Take 0.4 mg by mouth at bedtime.   03/29/2021   TURMERIC CURCUMIN PO Take 1 capsule by mouth daily.   03/29/2021     Vitals   Vitals:   03/30/21 1745 03/30/21 1815 03/30/21 1941 03/30/21 2342  BP: 131/63 (!) 127/92 (!) 155/74 (!) 122/59  Pulse: 64 63 (!) 59 66  Resp: 17 14 18 17   Temp:   98.1 F (36.7 C) 97.9 F (36.6 C)  TempSrc:   Oral Oral  SpO2: 100% 100% 100% 96%  Weight:      Height:         Body mass index is 24.34 kg/m.  Physical Exam   General: Laying comfortably in bed; in no acute distress.  HENT: Normal oropharynx and mucosa. Normal external appearance of ears and nose.  Neck: Supple, no pain or tenderness  CV: No JVD. No peripheral edema.  Pulmonary: Symmetric Chest rise. Normal respiratory effort.  Abdomen: Soft to touch, non-tender.  Ext: No cyanosis, edema, or deformity  Skin: No rash. Normal palpation of skin.   Musculoskeletal: Normal digits and nails by inspection. No clubbing.   Neurologic Examination  Mental status/Cognition: Alert, oriented to self, place, month and year, good attention.  Speech/language: Fluent, comprehension intact, object naming intact, repetition intact.  Cranial nerves:   CN II Pupils equal and reactive to light, no VF deficits    CN III,IV,VI EOM intact, no gaze preference or deviation, no  nystagmus    CN V normal sensation in V1, V2, and V3 segments bilaterally    CN VII no asymmetry, no nasolabial fold flattening    CN VIII normal hearing to speech    CN IX & X normal palatal elevation, no uvular deviation    CN XI 5/5 head turn and 5/5 shoulder shrug bilaterally    CN XII midline tongue protrusion    Motor:  Muscle bulk: normal, tone normal, pronator drift none tremor none Mvmt Root Nerve  Muscle Right Left Comments  SA C5/6 Ax Deltoid 5 5   EF C5/6 Mc Biceps 5 5   EE C6/7/8 Rad Triceps 5 5   WF C6/7 Med FCR     WE C7/8 PIN ECU     F Ab C8/T1 U ADM/FDI 5 5   HF L1/2/3 Fem Illopsoas 5 5   KE L2/3/4 Fem Quad 5 5   DF L4/5 D Peron Tib Ant 5 5   PF S1/2 Tibial Grc/Sol 5 5    Reflexes:  Right Left Comments  Pectoralis      Biceps (C5/6) 2 2   Brachioradialis (C5/6) 2 2    Triceps (C6/7) 2 2    Patellar (L3/4) 2 2    Achilles (S1) 2 2    Hoffman      Plantar  Jaw jerk    Sensation:  Light touch Intact throughout   Pin prick    Temperature    Vibration   Proprioception    Coordination/Complex Motor:  - Finger to Nose intact BL - Heel to shin mild ataxia in LLE - Rapid alternating movement are slowed in LLE. - Gait: deferred.  Labs   CBC:  Recent Labs  Lab 03/30/21 1255 03/30/21 1350  WBC 7.7 8.2  NEUTROABS  --  7.0  HGB 14.3 14.5  HCT 43.2 44.0  MCV 98.4 99.1  PLT 165 178    Basic Metabolic Panel:  Lab Results  Component Value Date   NA 140 03/30/2021   K 4.4 03/30/2021   CO2 25 03/30/2021   GLUCOSE 108 (H) 03/30/2021   BUN 25 (H) 03/30/2021   CREATININE 1.30 (H) 03/30/2021   CALCIUM 9.1 03/30/2021   GFRNONAA 54 (L) 03/30/2021   GFRAA 61 01/16/2019   Lipid Panel:  Lab Results  Component Value Date   LDLCALC 85 01/12/2021   HgbA1c:  Lab Results  Component Value Date   HGBA1C 5.1 01/16/2019   Urine Drug Screen:     Component Value Date/Time   LABOPIA NONE DETECTED 03/30/2021 1508   COCAINSCRNUR NONE DETECTED  03/30/2021 1508   LABBENZ NONE DETECTED 03/30/2021 1508   AMPHETMU NONE DETECTED 03/30/2021 1508   THCU NONE DETECTED 03/30/2021 1508   LABBARB NONE DETECTED 03/30/2021 1508    Alcohol Level     Component Value Date/Time   ETH <10 03/30/2021 1350    CT Head without contrast(Personally reviewed): CTH was negative for a large hypodensity concerning for a large territory infarct or hyperdensity concerning for an ICH  CT angio Head and Neck with contrast: pending  MRI Brain: Unable to get, pacemaker is incompatible  Impression   Sanskar Mandell is a 88 y.o. male with PMH significant for OSA, COPD, HT, HLD, 1st degree AV block, Pneumonia, hypothyroidism, sick sinus syndrome s/p pacemaker, parkinosons disease who presents with L Leg weakness. Presentation is highly concerning for a stroke. Suspect that its probably a small vessel stroke.   Primary Diagnosis:  Other cerebral infarction due to occlusion of stenosis of small artery.  Secondary Diagnosis: HTN, HLD.  Recommendations  - Frequent Neuro checks per stroke unit protocol - Recommend Vascular imaging with CTA head and neck - Recommend obtaining TTE - Recommend obtaining Lipid panel with LDL - Please start statin if LDL > 70 - Recommend HbA1c - Antithrombotic - Aspirin 81mg  daily along with plavix 75mg  daily x 21 days followed by Aspirin 81mg  daily alone. - Recommend DVT ppx - SBP goal - permissive hypertension first 24 h < 220/110. Held home meds.  - Recommend Telemetry monitoring for arrythmia - Recommend bedside swallow screen prior to PO intake. - Stroke education booklet - Recommend PT/OT/SLP consult  ______________________________________________________________________   Thank you for the opportunity to take part in the care of this patient. If you have any further questions, please contact the neurology consultation attending.  Signed,  Erick Blinks [facility] Pager Number  [phone] _ _ _   _ __   _ __ _ _  __ __   _ __   __ _

## 2021-04-01 DIAGNOSIS — I63421 Cerebral infarction due to embolism of right anterior cerebral artery: Secondary | ICD-10-CM | POA: Diagnosis not present

## 2021-04-01 DIAGNOSIS — G20 Parkinson's disease: Secondary | ICD-10-CM | POA: Diagnosis not present

## 2021-04-01 DIAGNOSIS — Z950 Presence of cardiac pacemaker: Secondary | ICD-10-CM | POA: Diagnosis not present

## 2021-04-01 DIAGNOSIS — R29898 Other symptoms and signs involving the musculoskeletal system: Secondary | ICD-10-CM | POA: Diagnosis not present

## 2021-04-01 LAB — HEMOGLOBIN A1C
Hgb A1c MFr Bld: 5.5 % (ref 4.8–5.6)
Mean Plasma Glucose: 111 mg/dL

## 2021-04-01 MED ORDER — MIDODRINE HCL 5 MG PO TABS
10.0000 mg | ORAL_TABLET | Freq: Three times a day (TID) | ORAL | Status: DC
  Administered 2021-04-01 – 2021-04-02 (×4): 10 mg via ORAL
  Filled 2021-04-01 (×4): qty 2

## 2021-04-01 NOTE — Progress Notes (Signed)
Admitted from [facility] .  Oriented to room and surroundings.  Admission packet with pt rights and responsibilities given and explained.   ?

## 2021-04-01 NOTE — Plan of Care (Signed)
?  Problem: Education: ?Goal: Knowledge of disease or condition will improve ?Outcome: Progressing ?Goal: Knowledge of secondary prevention will improve (SELECT ALL) ?Outcome: Progressing ?Goal: Knowledge of patient specific risk factors will improve (INDIVIDUALIZE FOR PATIENT) ?Outcome: Progressing ?Goal: Individualized Educational Video(s) ?Outcome: Progressing ?  ?Problem: Coping: ?Goal: Will verbalize positive feelings about self ?Outcome: Progressing ?Goal: Will identify appropriate support needs ?Outcome: Progressing ?  ?Problem: Health Behavior/Discharge Planning: ?Goal: Ability to manage health-related needs will improve ?Outcome: Progressing ?  ?Problem: Self-Care: ?Goal: Ability to participate in self-care as condition permits will improve ?Outcome: Progressing ?Goal: Verbalization of feelings and concerns over difficulty with self-care will improve ?Outcome: Progressing ?  ?Problem: Ischemic Stroke/TIA Tissue Perfusion: ?Goal: Complications of ischemic stroke/TIA will be minimized ?Outcome: Progressing ?  ?Problem: Education: ?Goal: Knowledge of General Education information will improve ?Description: Including pain rating scale, medication(s)/side effects and non-pharmacologic comfort measures ?Outcome: Progressing ?  ?Problem: Health Behavior/Discharge Planning: ?Goal: Ability to manage health-related needs will improve ?Outcome: Progressing ?  ?Problem: Clinical Measurements: ?Goal: Ability to maintain clinical measurements within normal limits will improve ?Outcome: Progressing ?Goal: Will remain free from infection ?Outcome: Progressing ?Goal: Diagnostic test results will improve ?Outcome: Progressing ?Goal: Respiratory complications will improve ?Outcome: Progressing ?Goal: Cardiovascular complication will be avoided ?Outcome: Progressing ?  ?Problem: Activity: ?Goal: Risk for activity intolerance will decrease ?Outcome: Progressing ?  ?Problem: Nutrition: ?Goal: Adequate nutrition will be  maintained ?Outcome: Progressing ?  ?Problem: Coping: ?Goal: Level of anxiety will decrease ?Outcome: Progressing ?  ?Problem: Elimination: ?Goal: Will not experience complications related to bowel motility ?Outcome: Progressing ?Goal: Will not experience complications related to urinary retention ?Outcome: Progressing ?  ?Problem: Pain Managment: ?Goal: General experience of comfort will improve ?Outcome: Progressing ?  ?Problem: Safety: ?Goal: Ability to remain free from injury will improve ?Outcome: Progressing ?  ?Problem: Skin Integrity: ?Goal: Risk for impaired skin integrity will decrease ?Outcome: Progressing ?  ?

## 2021-04-01 NOTE — Progress Notes (Signed)
?PROGRESS NOTE ? ? ? ?Victor Osborne  VZD:[id number] DOB: 07-28-1932 DOA: 03/30/2021 ?PCP: [name], [place]  ? ? ?Brief Narrative:  ?88 year old with history of sick sinus syndrome status post pacemaker, Parkinson's disease, bipolar disorder, hypertension, hyperlipidemia presented from with left lower extremity weakness causing him to fall and hit the back of his head on a wall.  Continue to fail numbness and tingling of his legs.  In the emergency room hemodynamically stable, EKG with AV paced rhythm.  Skeletal survey negative.  CT head was normal.  MRI incompatible pacemaker.  Admitted with TIA and orthostatic changes.   ? ? ?Assessment & Plan: ?  ?TIA/transient episodic left lower leg weakness and orthostatic hypotension causing recurrent symptoms. ?Clinical findings, episodic left leg weakness. ?CT head findings, no abnormal findings. ?MRI of the brain, unable to do.  Incompatible MRI present. ?CT angio of the head and neck, no large vessel occlusion or stenosis.  Mild degenerative changes. ?2D echocardiogram, essentially normal. ?Antiplatelet therapy, not on any antiplatelet therapy at home. started on aspirin and Plavix. ?LDL 76.  Already on statin.  At goal. ?Hemoglobin A1c,5.5 ?Therapy recommendations, home health PT/OT ?To be seen by PT/OT.  Seen by neurology. ?Patient was mobilized, he had 17 point drop in systolic blood pressure, however became symptomatic with left leg weakness and could not walk. ?He started on midodrine 5 mg 3 times daily, will increase the dose to 10 mg 3 times daily.  Multiple attempts to mobilize.  Orthostatic precautions. ? ?Hypothyroidism: Clinically euthyroid on current dose of levothyroxine.  Continued. ? ?Parkinson's disease: On Sinemet.  Continue. ? ?Complete heart block: Status post pacemaker.  Stable. ? ? ?DVT prophylaxis: Place TED hose Start: 03/31/21 1519 ?enoxaparin (LOVENOX) injection 40 mg Start: 03/30/21 2200 ? ? ?Code Status: Full code ?Family  Communication: Significant other at the bedside ?Disposition Plan: Status is: Inpatient ?Remains inpatient appropriate because: Significant findings, episodic left leg weakness, stroke work-up ?  ? ? ?Consultants:  ?Neurology ? ?Procedures:  ?None ? ?Antimicrobials:  ?None ? ? ?Subjective: ? ?Patient seen and examined.  No overnight events.  Ambulated, systolic blood pressure dropped by 17 point, however became dizzy and left leg went numb.  Symptoms improved after going back to bed. ? ?Objective: ?Vitals:  ? 04/01/21 0739 04/01/21 0838 04/01/21 0900 04/01/21 1056  ?BP: 131/80   107/68  ?Pulse: 60   62  ?Resp: '16 15 12 16  '$ ?Temp:    97.6 ?F (36.4 ?C)  ?TempSrc:    Oral  ?SpO2: 96%   100%  ?Weight:      ?Height:      ? ? ?Intake/Output Summary (Last 24 hours) at 04/01/2021 1308 ?Last data filed at 04/01/2021 0900 ?Gross per 24 hour  ?Intake --  ?Output 850 ml  ?Net -850 ml  ? ?Filed Weights  ? 03/30/21 1233  ?Weight: 90.7 kg  ? ? ?Examination: ? ?General exam: Appears calm and comfortable at rest.  Mild distress and anxious on mobility. ?Respiratory system: Clear to auscultation. Respiratory effort normal.  Pacemaker present. ?Cardiovascular system: S1 & S2 heard, RRR. No JVD, murmurs, rubs, gallops or clicks. No pedal edema. ?Gastrointestinal system: Abdomen is nondistended, soft and nontender. No organomegaly or masses felt. Normal bowel sounds heard. ?Central nervous system: Alert and oriented.  ?No any motor or sensory deficit.  Gait deferred. ? ? ? ? ?Data Reviewed: I have personally reviewed following labs and imaging studies ? ?CBC: ?Recent Labs  ?Lab 03/30/21 ?1255 03/30/21 ?1350  ?  WBC 7.7 8.2  ?NEUTROABS  --  7.0  ?HGB 14.3 14.5  ?HCT 43.2 44.0  ?MCV 98.4 99.1  ?PLT 165 178  ? ?Basic Metabolic Panel: ?Recent Labs  ?Lab 03/30/21 ?1255  ?NA 140  ?K 4.4  ?CL 107  ?CO2 25  ?GLUCOSE 108*  ?BUN 25*  ?CREATININE 1.30*  ?CALCIUM 9.1  ? ?GFR: ?Estimated Creatinine Clearance: 50.1 mL/min (A) (by C-G formula based on SCr  of 1.3 mg/dL (H)). ?Liver Function Tests: ?No results for input(s): AST, ALT, ALKPHOS, BILITOT, PROT, ALBUMIN in the last 168 hours. ?No results for input(s): LIPASE, AMYLASE in the last 168 hours. ?No results for input(s): AMMONIA in the last 168 hours. ?Coagulation Profile: ?Recent Labs  ?Lab 03/30/21 ?1350  ?INR 1.1  ? ?Cardiac Enzymes: ?No results for input(s): CKTOTAL, CKMB, CKMBINDEX, TROPONINI in the last 168 hours. ?BNP (last 3 results) ?No results for input(s): PROBNP in the last 8760 hours. ?HbA1C: ?Recent Labs  ?  03/31/21 ?0147  ?HGBA1C 5.5  ? ?CBG: ?Recent Labs  ?Lab 03/30/21 ?1252  ?GLUCAP 92  ? ?Lipid Profile: ?Recent Labs  ?  03/31/21 ?0147  ?CHOL 154  ?HDL 67  ?[place] 76  ?TRIG 55  ?CHOLHDL 2.3  ? ?Thyroid Function Tests: ?No results for input(s): TSH, T4TOTAL, FREET4, T3FREE, THYROIDAB in the last 72 hours. ?Anemia Panel: ?No results for input(s): VITAMINB12, FOLATE, FERRITIN, TIBC, IRON, RETICCTPCT in the last 72 hours. ?Sepsis Labs: ?No results for input(s): PROCALCITON, LATICACIDVEN in the last 168 hours. ? ?Recent Results (from the past 240 hour(s))  ?Resp Panel by RT-PCR (Flu A&B, Covid) Nasopharyngeal Swab     Status: None  ? Collection Time: 03/30/21  2:00 PM  ? Specimen: Nasopharyngeal Swab; Nasopharyngeal(NP) swabs in vial transport medium  ?Result Value Ref Range Status  ? SARS Coronavirus 2 by RT PCR NEGATIVE NEGATIVE Final  ?  Comment: (NOTE) ?SARS-CoV-2 target nucleic acids are NOT DETECTED. ? ?The SARS-CoV-2 RNA is generally detectable in upper respiratory ?specimens during the acute phase of infection. The lowest ?concentration of SARS-CoV-2 viral copies this assay can detect is ?138 copies/mL. A negative result does not preclude SARS-Cov-2 ?infection and should not be used as the sole basis for treatment or ?other patient management decisions. A negative result may occur with  ?improper specimen collection/handling, submission of specimen other ?than nasopharyngeal swab, presence  of viral mutation(s) within the ?areas targeted by this assay, and inadequate number of viral ?copies(<138 copies/mL). A negative result must be combined with ?clinical observations, patient history, and epidemiological ?information. The expected result is Negative. ? ?Fact Sheet for Patients:  ?EntrepreneurPulse.com.au ? ?Fact Sheet for Healthcare Providers:  ?IncredibleEmployment.be ? ?This test is no t yet approved or cleared by the [place] FDA and  ?has been authorized for detection and/or diagnosis of SARS-CoV-2 by ?FDA under an Emergency Use Authorization (EUA). This EUA will remain  ?in effect (meaning this test can be used) for the duration of the ?COVID-19 declaration under Section 564(b)(1) of the Act, 21 ?U.S.C.section 360bbb-3(b)(1), unless the authorization is terminated  ?or revoked sooner.  ? ? ?  ? Influenza A by PCR NEGATIVE NEGATIVE Final  ? Influenza B by PCR NEGATIVE NEGATIVE Final  ?  Comment: (NOTE) ?The Xpert Xpress SARS-CoV-2/FLU/RSV plus assay is intended as an aid ?in the diagnosis of influenza from Nasopharyngeal swab specimens and ?should not be used as a sole basis for treatment. Nasal washings and ?aspirates are unacceptable for Xpert Xpress SARS-CoV-2/FLU/RSV ?testing. ? ?Fact Sheet  for Patients: ?EntrepreneurPulse.com.au ? ?Fact Sheet for Healthcare Providers: ?IncredibleEmployment.be ? ?This test is not yet approved or cleared by the [place] FDA and ?has been authorized for detection and/or diagnosis of SARS-CoV-2 by ?FDA under an Emergency Use Authorization (EUA). This EUA will remain ?in effect (meaning this test can be used) for the duration of the ?COVID-19 declaration under Section 564(b)(1) of the Act, 21 U.S.C. ?section 360bbb-3(b)(1), unless the authorization is terminated or ?revoked. ? ?Performed at [facility], [address] ?[place], [place] [address] ?  ?  ? ? ? ? ? ?Radiology  Studies: ?CT ANGIO HEAD NECK W WO CM ? ?Result Date: 03/31/2021 ?CLINICAL DATA:  88 year old male with left lower extremity weakness. EXAM: CT ANGIOGRAPHY HEAD AND NECK TECHNIQUE: Multidetector CT imaging of

## 2021-04-01 NOTE — Progress Notes (Signed)
STROKE TEAM PROGRESS NOTE  ? ?INTERVAL HISTORY ?Daughter at the bedside. Pt stated that today again when he walked with PT/OT in the hallway, he started to have left leg numbness and weakness and not able to stand, had to put back in bed, now left leg weakness again much improved. Orthostatic vital today showed BP lying 107/68, sitting 106/64, standing 90/52 and standing 3 min 97/53. Still mild orthostasis ? ?Vitals:  ? 04/01/21 1056 04/01/21 1200 04/01/21 1600 04/01/21 1755  ?BP: 107/68   120/60  ?Pulse: 62   64  ?Resp: '16 16 14 17  '$ ?Temp: 97.6 ?F (36.4 ?C)   98.3 ?F (36.8 ?C)  ?TempSrc: Oral   Oral  ?SpO2: 100%   98%  ?Weight:      ?Height:      ? ?CBC:  ?Recent Labs  ?Lab 03/30/21 ?1255 03/30/21 ?1350  ?WBC 7.7 8.2  ?NEUTROABS  --  7.0  ?HGB 14.3 14.5  ?HCT 43.2 44.0  ?MCV 98.4 99.1  ?PLT 165 178  ? ?Basic Metabolic Panel:  ?Recent Labs  ?Lab 03/30/21 ?1255  ?NA 140  ?K 4.4  ?CL 107  ?CO2 25  ?GLUCOSE 108*  ?BUN 25*  ?CREATININE 1.30*  ?CALCIUM 9.1  ? ?Lipid Panel:  ?Recent Labs  ?Lab 03/31/21 ?0147  ?CHOL 154  ?TRIG 55  ?HDL 67  ?CHOLHDL 2.3  ?VLDL 11  ?[place] 76  ? ?HgbA1c:  ?Recent Labs  ?Lab 03/31/21 ?0147  ?HGBA1C 5.5  ? ?Urine Drug Screen:  ?Recent Labs  ?Lab 03/30/21 ?1508  ?LABOPIA NONE DETECTED  ?COCAINSCRNUR NONE DETECTED  ?LABBENZ NONE DETECTED  ?AMPHETMU NONE DETECTED  ?THCU NONE DETECTED  ?LABBARB NONE DETECTED  ?  ?Alcohol Level  ?Recent Labs  ?Lab 03/30/21 ?1350  ?ETH <10  ? ? ?IMAGING past 24 hours ?CT HEAD WO CONTRAST (5MM) ? ?Result Date: 03/31/2021 ?CLINICAL DATA:  Follow-up stroke EXAM: CT HEAD WITHOUT CONTRAST TECHNIQUE: Contiguous axial images were obtained from the base of the skull through the vertex without intravenous contrast. RADIATION DOSE REDUCTION: This exam was performed according to the departmental dose-optimization program which includes automated exposure control, adjustment of the mA and/or kV according to patient size and/or use of iterative reconstruction technique.  COMPARISON:  Head CT yesterday.  CT angiography earlier today. FINDINGS: Brain: Age related volume loss. Mild chronic small-vessel ischemic change of the hemispheric white matter. No CT evidence of acute infarction, mass lesion, hemorrhage, hydrocephalus or extra-axial collection. Vascular: There is atherosclerotic calcification of the major vessels at the base of the brain. Skull: Negative Sinuses/Orbits: Clear/normal Other: None IMPRESSION: No change. No acute or subacute CT finding. Mild age related volume loss and mild chronic small-vessel ischemic change of the cerebral hemispheric white matter. Electronically Signed   By: Nelson Chimes M.D.   On: 03/31/2021 21:51   ? ?PHYSICAL EXAM ? ?Physical Exam  ?Constitutional: Appears well-developed and well-nourished.  ?Cardiovascular: Normal rate and regular rhythm.  ?Respiratory: Effort normal, non-labored breathing ? ?Neuro: ?Mental Status: ?Patient is awake, alert, oriented to person, place, month, year, and situation. ?Patient is able to give a clear and coherent history. ?No signs of aphasia or neglect ?Cranial Nerves: ?II: Visual Fields are full. Pupils are equal, round, and reactive to light.   ?III,IV, VI: EOMI without ptosis or diploplia.  ?V: Facial sensation is symmetric to temperature ?VII: Facial movement is symmetric resting and smiling ?VIII: Hearing is intact to voice ?X: Palate elevates symmetrically ?XI: Shoulder shrug is symmetric. ?XII: Tongue protrudes  midline without atrophy or fasciculations.  ?Motor: ?Tone is normal. Bulk is normal. 5/5 strength was present BUE ?RLE 5/5 ?LLE 4+/5 ?Sensory: ?Sensation is symmetric to light touch and temperature in the arms and legs. No extinction to DSS present.  ?Deep Tendon Reflexes: ?2+ and symmetric in the biceps and patellae.  ?Plantars: ?Toes are downgoing bilaterally.  ?Cerebellar: ?FNF and HKS are intact bilaterally ? ? ?ASSESSMENT/PLAN ?[name] is a 88 y.o. 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 Orthostatic vitals and ted hose ordered.  Patient is unable to receive an MRI due to pacemaker incompatibility.  CTA shows moderate stenosis in the distal ACA branches which could correlate to his left leg symptoms.  Currently awaiting 24-hour repeat CT. Pacemaker maker interrogation completed, no or abnormalities noted. ? ? ?Stroke:  Possible right ACA infarct likely secondary due to left ACA moderate stenosis, large vessel disease source  ?Code Stroke CT head No acute abnormality. Small vessel disease.  ?CTA head & neck Generally mild for age atherosclerosis in the head and neck. Although moderate stenosis in very distal ACA branches could predispose to ACA motor area ischemia. ?MRI -not able to perform due to pacemaker ?Pacemaker interrogation no A-fib ?CT repeat no acute finding ?2D Echo EF 65 to 70% ?LDL 76 ?HgbA1c 5.1 ?VTE prophylaxis -Lovenox ?No antithrombotic prior to admission, now on aspirin 325 mg daily and clopidogrel 75 mg daily for 3 months and then aspirin alone. ?Therapy recommendations:  Home health ?Disposition:  Pending ? ?Orthostatic hypotension ?Patient endorsed lightheadedness on standing up at home ?Also patient complained lightheadedness this morning standing up with PT ?Also reported worsening left leg weakness on standing up this morning ?Orthostatic vital 3/15 lying 122/65, sitting 113/65, standing 75mn 94/54, standing 325m 102/57 ?Orthostatic vital 3/16 lying 107/68, sitting 106/64, standing 90/52 and standing 3 min 97/53. ?Could be related to his PD ?Plan TED hose ?On midodrine 5 mg->'10mg'$  3 times daily ? ?Hyperlipidemia ?Home meds:  Pravastatin '40mg'$  ?LDL 76, goal < 70 ?Increase to pravastatin 80.  No high intensity statin given advanced age and LDL not far from goal. ?Continue statin at discharge ? ?Other Stroke Risk Factors ?Advanced Age >/= 6569?OSA ? ?Other Active  Problems ?Parkinson's disease ?Home meds: Carbidopa-levodopa ?Seroquel ?[facility].   ?Sick sinus syndrome s/p pacemaker placement ?Interrogation done, no abnormalities noted ?COPD ? ?Hospital day # 2 ? ? ?JiRosalin HawkingMD PhD ?Stroke Neurology ?04/01/2021 ?6:38 PM ? ? ? ?To contact Stroke Continuity provider, please refer to [email or web address] hours, contact General Neurology ? ?

## 2021-04-01 NOTE — Progress Notes (Signed)
Physical Therapy Treatment ?Patient Details ?Name: Victor [facility]borne [facility] ?MRN: [id number] ?DOB: Aug 11, 1932 ?Today's Date: 04/01/2021 ? ? ?History of Present Illness 88 year old admitted 3/14  presented with left lower extremity weakness causing him to fall and hit the back of his head on a wall.  Continue to have numbness and tingling of his legs.  CT head was normal.  MRI incompatible pacemaker.  Admitted with TIA.  PMH:  sick sinus syndrome status post pacemaker, Parkinson's disease, bipolar disorder, hypertension hyperlipidemia ? ?  ?PT Comments  ? ? Improved response to standing activity and gait, but BP issues not resolved.  Emphasis on standing exercise for strengthening and acclimating to standing, progression of gait stability/tolerance with AD. ?   ?Recommendations for follow up therapy are one component of a multi-disciplinary discharge planning process, led by the attending physician.  Recommendations may be updated based on patient status, additional functional criteria and insurance authorization. ? ?Follow Up Recommendations ? Other (comment) (still dependent on symptom resolution which has improved, but not resolved.) ?  ?  ?Assistance Recommended at Discharge Intermittent Supervision/Assistance  ?Patient can return home with the following   ?  ?Equipment Recommendations ? None recommended by PT  ?  ?Recommendations for Other Services   ? ? ?  ?Precautions / Restrictions Precautions ?Precautions: Fall ?Precaution Comments: watch s/s of falling BP  ?  ? ?Mobility ? Bed Mobility ?Overal bed mobility: Independent ?  ?  ?  ?  ?  ?  ?  ?  ? ?Transfers ?Overall transfer level: Needs assistance ?Equipment used: Rolling walker (2 wheels) ?Transfers: Sit to/from Stand ?Sit to Stand: Min guard ?  ?  ?  ?  ?  ?General transfer comment: cues for improved transfer safety with RW/rollator ?  ? ?Ambulation/Gait ?Ambulation/Gait assistance: Min guard, Min assist ?Gait Distance (Feet): 24 Feet (x2) ?Assistive device:  Rolling walker (2 wheels) ?Gait Pattern/deviations: Step-through pattern ?  ?Gait velocity interpretation: <1.8 ft/sec, indicate of risk for recurrent falls ?  ?General Gait Details: Initially pt noting some numbness and incoordination L LE during exercise, but after warm up, the s/s lessened.  With gait today, some shaking and mild incoordination with need for minimal assist, but not the moderate assist needed 3/15 ? ? ?Stairs ?  ?  ?  ?  ?  ? ? ?Wheelchair Mobility ?  ? ?Modified Rankin (Stroke Patients Only) ?Modified Rankin (Stroke Patients Only) ?Pre-Morbid Rankin Score: No symptoms ?Modified Rankin: Moderately severe disability ? ? ?  ?Balance Overall balance assessment: Needs assistance ?  ?Sitting balance-Leahy Scale: Good ?  ?  ?Standing balance support: Single extremity supported, Bilateral upper extremity supported ?Standing balance-Leahy Scale: Poor ?Standing balance comment: relies on UE support ?  ?  ?  ?  ?  ?  ?  ?  ?  ?  ?  ?  ? ?  ?Cognition Arousal/Alertness: Awake/alert ?Behavior During Therapy: [facility] for tasks assessed/performed ?Overall Cognitive Status: Within Functional Limits for tasks assessed ?  ?  ?  ?  ?  ?  ?  ?  ?  ?  ?  ?  ?  ?  ?  ?  ?  ?  ?  ? ?  ?Exercises General Exercises - Lower Extremity ?Hip ABduction/ADduction: AROM, Strengthening, Both, 10 reps, Standing ?Hip Flexion/Marching: AROM, Strengthening, Both, 10 reps, Standing ?Toe Raises: AROM, Both, 15 reps, Standing ?Heel Raises: AROM, Strengthening, Both, 10 reps, Standing ?Mini-Sqauts: AROM, Strengthening, 10 reps, Standing ?Other Exercises ?Other Exercises:  modified standing pushups x10 ? ?  ?General Comments   ?  ?  ? ?Pertinent Vitals/Pain Pain Assessment ?Pain Assessment: Faces ?Faces Pain Scale: No hurt ?Pain Intervention(s): Monitored during session  ? ? ?Home Living   ?  ?  ?  ?  ?  ?  ?  ?  ?  ?   ?  ?Prior Function    ?  ?  ?   ? ?PT Goals (current goals can now be found in the care plan section) Acute Rehab PT  Goals ?Patient Stated Goal: to go home ?PT Goal Formulation: With patient ?Time For Goal Achievement: 04/14/21 ?Potential to Achieve Goals: Good ?Progress towards PT goals: Progressing toward goals ? ?  ?Frequency ? ? ? Min 4X/week ? ? ? ?  ?PT Plan Current plan remains appropriate  ? ? ?Co-evaluation   ?  ?  ?  ?  ? ?  ?AM-PAC PT "6 Clicks" Mobility   ?Outcome Measure ? Help needed turning from your back to your side while in a flat bed without using bedrails?: None ?Help needed moving from lying on your back to sitting on the side of a flat bed without using bedrails?: None ?Help needed moving to and from a bed to a chair (including a wheelchair)?: A Little ?Help needed standing up from a chair using your arms (e.g., wheelchair or bedside chair)?: A Little ?Help needed to walk in hospital room?: A Little ?Help needed climbing 3-5 steps with a railing? : A Lot ?6 Click Score: 19 ? ?  ?End of Session   ?Activity Tolerance: Patient tolerated treatment well;Other (comment) (mild BP issues) ?Patient left: in bed;with call bell/phone within reach;with bed alarm set;with family/visitor present ?Nurse Communication: Mobility status ?PT Visit Diagnosis: Other abnormalities of gait and mobility (R26.89);Difficulty in walking, not elsewhere classified (R26.2);Other symptoms and signs involving the nervous system (R29.898) ?  ? ? ?Time: 4562-5638 ?PT Time Calculation (min) (ACUTE ONLY): 32 min ? ?Charges:  $Gait Training: 8-22 mins ?$Therapeutic Exercise: 8-22 mins          ?          ? ?04/01/2021 ? ?Ginger Carne., PT ?Acute Rehabilitation Services ?[phone]  (pager) ?[phone]  (office) ? ? ?Tessie Fass [employer] ?04/01/2021, 6:05 PM ? ?

## 2021-04-02 ENCOUNTER — Telehealth: Payer: Self-pay

## 2021-04-02 ENCOUNTER — Other Ambulatory Visit (HOSPITAL_COMMUNITY): Payer: Self-pay

## 2021-04-02 DIAGNOSIS — I495 Sick sinus syndrome: Secondary | ICD-10-CM | POA: Diagnosis not present

## 2021-04-02 DIAGNOSIS — Z950 Presence of cardiac pacemaker: Secondary | ICD-10-CM | POA: Diagnosis not present

## 2021-04-02 DIAGNOSIS — G20 Parkinson's disease: Secondary | ICD-10-CM | POA: Diagnosis not present

## 2021-04-02 DIAGNOSIS — I63421 Cerebral infarction due to embolism of right anterior cerebral artery: Secondary | ICD-10-CM | POA: Diagnosis not present

## 2021-04-02 MED ORDER — CLOPIDOGREL BISULFATE 75 MG PO TABS
75.0000 mg | ORAL_TABLET | Freq: Every day | ORAL | 2 refills | Status: DC
  Filled 2021-04-02: qty 30, 30d supply, fill #0

## 2021-04-02 MED ORDER — MIDODRINE HCL 10 MG PO TABS
10.0000 mg | ORAL_TABLET | Freq: Three times a day (TID) | ORAL | 0 refills | Status: AC
  Filled 2021-04-02: qty 90, 30d supply, fill #0

## 2021-04-02 MED ORDER — ASPIRIN 325 MG PO TBEC
325.0000 mg | DELAYED_RELEASE_TABLET | Freq: Every day | ORAL | 11 refills | Status: DC
  Filled 2021-04-02: qty 30, 30d supply, fill #0

## 2021-04-02 NOTE — Discharge Summary (Signed)
Physician Discharge Summary  ?Victor Osborne RXY:[id number] DOB: 06-Feb-1932 DOA: 03/30/2021 ? ?PCP: [name], [place] ? ?Admit date: 03/30/2021 ?Discharge date: 04/02/2021 ? ?Admitted From: Home ?Disposition: Home with home health ? ?Recommendations for Outpatient Follow-up:  ?Follow up with PCP in 1-2 weeks ?Keep up your follow-up with neurologist that is a scheduled on 3/18. ? ?Home Health: PT/OT ?Equipment/Devices: Rollator, present at home ? ?Discharge Condition: Stable ?CODE STATUS: Full code ?Diet recommendation: Regular diet. ? ?Discharge summary: ?88 year old with history of sick sinus syndrome status post pacemaker, Parkinson's disease, bipolar disorder, hypertension, hyperlipidemia presented from with left lower extremity weakness causing him to fall and hit the back of his head on a wall.  Continue to fail numbness and tingling of his legs.  In the emergency room hemodynamically stable, EKG with AV paced rhythm.  Skeletal survey negative.  CT head was normal.  MRI incompatible pacemaker.  Admitted with TIA and orthostatic changes.   ? ?Assessment & Plan: ?  ?TIA/transient episodic left lower leg weakness and orthostatic hypotension causing recurrent symptoms. ?Presented with episodic left leg weakness mostly present on standing upright.  No neurological deficit on exam. ?Presentation CT head, subsequent CT scan of the head with no abnormal acute findings. ?MRI of the brain not done, incompatible pacemaker. ?CT angio of the head and neck, no large vessel occlusion or stenosis.  Mild degenerative changes. ?2D echocardiogram, essentially normal. ?Antiplatelet therapy, not on any antiplatelet therapy at home. started on aspirin and Plavix. ?Neurology recommended aspirin 325 mg daily to continue lifelong, Plavix 75 mg daily for the 3 months. ?LDL 76.  Patient is on pravastatin 40 mg daily.  His LDL is not too far from goal.  To prevent further complications from statin, will keep on similar dose  without increase. ?Hemoglobin A1c,5.5.  Nondiabetic. ?Therapy recommendations, home health PT/OT ?Patient continues to have 15 to 20 point drop in systolic blood pressure on standing, standing systolic blood pressure less than 90 causing recurrent left leg weakness.  Started on midodrine with increasing dose to 10 mg 3 times a day with standing systolic blood pressure 628, no recurrence of symptoms. ?Patient will continue to take orthostatic precautions at home. ? ?Chronic medical issues including hypothyroidism, Parkinson disease, complete heart block status post pacemaker, bipolar disorder remained all is stable. ? ?Able to go home today. ? ?Discharge Diagnoses:  ?Principal Problem: ?  Left leg weakness ?Active Problems: ?  Sinoatrial node dysfunction (HCC) ?  Pacemaker ?  Parkinson disease ([place]) ?  Hypothyroidism ?  Bipolar I disorder, current or most recent episode depressed, in partial remission ([place]) ?  Cerebral embolism with cerebral infarction ? ? ? ?Discharge Instructions ? ?Discharge Instructions   ? ? Call MD for:  persistant dizziness or light-headedness   Complete by: As directed ?  ? Diet general   Complete by: As directed ?  ? Increase activity slowly   Complete by: As directed ?  ? ?  ? ?Allergies as of 04/02/2021   ?No Known Allergies ?  ? ?  ?Medication List  ?  ? ?TAKE these medications   ? ?acetaminophen 325 MG tablet ?Commonly known as: TYLENOL ?Take 650 mg by mouth every 6 (six) hours as needed. ?  ?alclomethasone 0.05 % ointment ?Commonly known as: ACLOVATE ?Apply 1 application topically 2 (two) times daily as needed (for rash). ?  ?aspirin 325 MG EC tablet ?Take 1 tablet (325 mg total) by mouth daily. ?Start taking on: April 03, 2021 ?  ?  Bee Pollen 550 MG Caps ?Take 550 mg by mouth daily. ?  ?carbamazepine 200 MG tablet ?Commonly known as: TEGRETOL ?Take 1 tablet (200 mg total) by mouth 2 (two) times daily. ?  ?carbidopa-levodopa 25-100 MG tablet ?Commonly known as: SINEMET IR ?Take 2 tablets  by mouth 3 (three) times daily. ?  ?cholecalciferol 1000 units tablet ?Commonly known as: VITAMIN D ?Take 2,000 Units by mouth daily. ?  ?clopidogrel 75 MG tablet ?Commonly known as: PLAVIX ?Take 1 tablet (75 mg total) by mouth daily. ?Start taking on: April 03, 2021 ?  ?CO Q 10 PO ?Take 1 capsule by mouth daily. ?  ?desoximetasone 0.25 % cream ?Commonly known as: TOPICORT ?Apply 1 application topically 2 (two) times daily. ?  ?ferrous sulfate 324 (65 Fe) MG Tbec ?Take 324 mg by mouth. ?  ?Flax Seed Oil 1300 MG Caps ?Take 1 capsule by mouth daily. ?  ?FLUoxetine 10 MG capsule ?Commonly known as: PROZAC ?Take 1 capsule (10 mg total) by mouth daily. ?  ?levothyroxine 125 MCG tablet ?Commonly known as: Levothroid ?Take 1 tablet (125 mcg total) by mouth daily before breakfast. ?  ?MAGNESIUM PO ?Take 1 capsule by mouth daily. ?  ?midodrine 10 MG tablet ?Commonly known as: PROAMATINE ?Take 1 tablet (10 mg total) by mouth 3 (three) times daily with meals. ?  ?multivitamin with minerals Tabs tablet ?Take 1 tablet by mouth daily. ?  ?OMEGA-3 CF PO ?Take 1,200 mg by mouth daily. ?  ?POTASSIUM GLUCONATE PO ?Take 1 capsule by mouth daily. ?  ?pravastatin 40 MG tablet ?Commonly known as: PRAVACHOL ?Take 1 tablet (40 mg total) by mouth daily. ?  ?Probiotic-Prebiotic 1-250 BILLION-MG Caps ?Take 1 capsule by mouth daily. ?  ?QUEtiapine 50 MG Tb24 24 hr tablet ?Commonly known as: SEROQUEL XR ?Take 1 tablet (50 mg total) by mouth at bedtime. ?  ?tamsulosin 0.4 MG Caps capsule ?Commonly known as: FLOMAX ?Take 0.4 mg by mouth at bedtime. ?  ?TURMERIC CURCUMIN PO ?Take 1 capsule by mouth daily. ?  ? ?  ? ? Follow-up Information   ? ? Care, [facility] Follow up.   ?Specialty: Home Health Services ?Why: The home health agency will contact you for the first home visit. ?Contact information: ?[address] ?[address] ?[place] [address] ?[phone] ? ? ?  ?  ? ? Ludwig Clarks, DO. Go on 04/13/2021.   ?Specialty:  Neurology ?Contact information: ?[address]  ?[address] ?[place] [address] ?[phone] ? ? ?  ?  ? ?  ?  ? ?  ? ?No Known Allergies ? ?Consultations: ?Neurology ? ? ?Procedures/Studies: ?CT ANGIO HEAD NECK W WO CM ? ?Result Date: 03/31/2021 ?CLINICAL DATA:  88 year old male with left lower extremity weakness. EXAM: CT ANGIOGRAPHY HEAD AND NECK TECHNIQUE: Multidetector CT imaging of the head and neck was performed using the standard protocol during bolus administration of intravenous contrast. Multiplanar CT image reconstructions and MIPs were obtained to evaluate the vascular anatomy. Carotid stenosis measurements (when applicable) are obtained utilizing NASCET criteria, using the distal internal carotid diameter as the denominator. RADIATION DOSE REDUCTION: This exam was performed according to the departmental dose-optimization program which includes automated exposure control, adjustment of the mA and/or kV according to patient size and/or use of iterative reconstruction technique. CONTRAST:  67m OMNIPAQUE IOHEXOL 350 MG/ML SOLN COMPARISON:  Head CT yesterday, and earlier. FINDINGS: CT HEAD Brain: No midline shift, ventriculomegaly, mass effect, evidence of mass lesion, intracranial hemorrhage or evidence  of cortically based acute infarction. Gray-white matter differentiation appears stable and within normal limits for age. No encephalomalacia identified. Calvarium and skull base: No acute osseous abnormality identified. Left C1/occiput degeneration. Paranasal sinuses: Visualized paranasal sinuses and mastoids are stable and well aerated. Orbits: Postoperative changes to both globes. No acute orbit or scalp soft tissue finding. CTA NECK Skeleton: Advanced cervical spine degeneration. Mild dextroconvex cervical and levoconvex upper thoracic scoliosis. Degenerative interbody ankylosis at C5-C6. No acute osseous abnormality identified. Upper chest: Left chest cardiac pacemaker. Moderate upper lobe  emphysema. No superior mediastinal lymphadenopathy. Other neck: Negative. Aortic arch: Calcified aortic atherosclerosis. Mildly tortuous aortic arch. Three vessel arch configuration. Right carotid system: Minimal brachioce

## 2021-04-02 NOTE — Care Management Important Message (Signed)
Important Message ? ?Patient Details  ?Name: Victor Osborne ?MRN: [id number] ?Date of Birth: [birth year] ? ? ?Medicare Important Message Given:  Yes ? ? ? ? ?Raschelle Wisenbaker ?04/02/2021, 2:38 PM ?

## 2021-04-02 NOTE — Telephone Encounter (Signed)
Pt called no answer left a voice mail to call the office back  °

## 2021-04-02 NOTE — Progress Notes (Addendum)
STROKE TEAM PROGRESS NOTE  ? ?INTERVAL HISTORY ?04/02/21: Seen sitting in bed, no family at bedside. Alert, oriented and pleasantly joking with NP. Pt stated that today therapist did bed exercises with him that "got him going" prior to walking and due to this change he was able to walk without becoming dizzy or weak, He says now left leg weakness again much improved. Blood pressure stable 130/70 HR- 62. no orthostatic BP results since yesterday.  ? ? ?Vitals:  ? 04/01/21 2000 04/01/21 2326 04/02/21 5784 04/02/21 0847  ?BP: (!) 146/73 128/82 107/63 133/70  ?Pulse: 61 (!) 59 64 60  ?Resp: '14 18 18 20  '$ ?Temp: 97.8 ?F (36.6 ?C) 98.3 ?F (36.8 ?C) 98 ?F (36.7 ?C) 97.7 ?F (36.5 ?C)  ?TempSrc: Oral Oral Oral Oral  ?SpO2: 96% 96% 94% 100%  ?Weight:      ?Height:      ? ?CBC:  ?Recent Labs  ?Lab 03/30/21 ?1255 03/30/21 ?1350  ?WBC 7.7 8.2  ?NEUTROABS  --  7.0  ?HGB 14.3 14.5  ?HCT 43.2 44.0  ?MCV 98.4 99.1  ?PLT 165 178  ? ? ?Basic Metabolic Panel:  ?Recent Labs  ?Lab 03/30/21 ?1255  ?NA 140  ?K 4.4  ?CL 107  ?CO2 25  ?GLUCOSE 108*  ?BUN 25*  ?CREATININE 1.30*  ?CALCIUM 9.1  ? ? ?Lipid Panel:  ?Recent Labs  ?Lab 03/31/21 ?0147  ?CHOL 154  ?TRIG 55  ?HDL 67  ?CHOLHDL 2.3  ?VLDL 11  ?[place] 76  ? ? ?HgbA1c:  ?Recent Labs  ?Lab 03/31/21 ?0147  ?HGBA1C 5.5  ? ? ?Urine Drug Screen:  ?Recent Labs  ?Lab 03/30/21 ?1508  ?LABOPIA NONE DETECTED  ?COCAINSCRNUR NONE DETECTED  ?LABBENZ NONE DETECTED  ?AMPHETMU NONE DETECTED  ?THCU NONE DETECTED  ?LABBARB NONE DETECTED  ? ?  ?Alcohol Level  ?Recent Labs  ?Lab 03/30/21 ?1350  ?ETH <10  ? ? ? ?IMAGING past 24 hours ?No results found. ? ?PHYSICAL EXAM ? ?Physical Exam  ?Constitutional: Appears well-developed and well-nourished.  ?Cardiovascular: Normal rate and regular rhythm.  ?Respiratory: Effort normal, non-labored breathing ? ?Neuro: ?Mental Status: ?Patient is awake, alert, oriented to person, place, month, year, and situation. ?Patient is able to give a clear and coherent history. ?No  signs of aphasia or neglect ?Cranial Nerves: ?II: Visual Fields are full. Pupils are equal, round, and reactive to light.   ?III,IV, VI: EOMI without ptosis or diploplia.  ?V: Facial sensation is symmetric to temperature ?VII: Facial movement is symmetric resting and smiling ?VIII: Hearing is intact to voice ?X: Palate elevates symmetrically ?XI: Shoulder shrug is symmetric. ?XII: Tongue protrudes midline without atrophy or fasciculations.  ?Motor: ?Tone is normal. Bulk is normal. 5/5 strength was present BUE ?RLE 5/5 ?LLE 4+/5 ?Sensory: ?Sensation is symmetric to light touch and temperature in the arms and legs. No extinction to DSS present.  ?Deep Tendon Reflexes: ?2+ and symmetric in the biceps and patellae.  ?Plantars: ?Toes are downgoing bilaterally.  ?Cerebellar: ?FNF  intact bilaterally ? ? ?ASSESSMENT/PLAN ?[name] is a 88 y.o. 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 Orthostatic vitals and ted hose ordered.  Patient is unable to receive an MRI due to pacemaker incompatibility.  CTA shows moderate stenosis in the distal ACA branches which could correlate to his left leg symptoms. 24-hour repeat CT showed no change/stable. Pacemaker maker interrogation completed, no other abnormalities noted. ? ? ?  Stroke:  Possible right ACA infarct likely secondary due to left ACA moderate stenosis, large vessel disease source  ?Code Stroke CT head No acute abnormality. Small vessel disease.  ?CTA head & neck Generally mild for age atherosclerosis in the head and neck. Although moderate stenosis in distal ACA branches could predispose to ACA motor area ischemia. ?MRI -not able to perform due to pacemaker ?Pacemaker interrogation no A-fib ?CT repeat no acute finding ?2D Echo EF 65 to 70% ?LDL 76 ?HgbA1c 5.1 ?VTE prophylaxis -Lovenox ?No antithrombotic prior to admission, now on DAPT with aspirin 325 mg and  clopidogrel 75 mg daily for 3 months and then monotherapy with aspirin alone. ?Therapy recommendations:  Home health ?Disposition:  Pending ? ?Orthostatic hypotension ?Patient endorsed lightheadedness on standing up at home ?Also patient complained lightheadedness this morning standing up with PT ?Also reported worsening left leg weakness on standing up this morning ?Orthostatic vital 3/15 lying 122/65, sitting 113/65, standing 72mn 94/54, standing 32m 102/57 ?Orthostatic vital 3/16 lying 107/68, sitting 106/64, standing 90/52 and standing 3 min 97/53. ?TED hose ?On midodrine 5 mg->'10mg'$  3 times daily ?Dizziness and lightheadedness could be related to his Parkinson's disease and may respond to Northera (droxiopa). We are unable to start inpatient [facility] I have sent a copy of this note with recommendations along with a message to his outpatient neurologist Dr. TaCarles Colletho he wil see outpatient on 04/13/21 ? ?Hyperlipidemia ?Home meds:  Pravastatin '40mg'$  ?LDL 76, goal < 70 ?Increase to pravastatin 80 ?Continue statin at discharge ? ?Other Stroke Risk Factors ?Advanced Age >/= 6534?OSA ? ?Other Active Problems ?Parkinson's disease ?Home meds: Carbidopa-levodopa ?Seroquel ?[facility].  Will discuss Northera as a new parkinson's medication that can help with orthostatic hypotension- dizziness, lightheadedness that patient is now suffering.  ?Sick sinus syndrome s/p pacemaker placement ?Interrogation done, no abnormalities noted ?COPD ? ?Hospital day # 3 ? ?Rounding APP- JeParke PoissonAGNP ? ?ATTENDING NOTE: ?I reviewed above note and agree with the assessment and plan. Pt was seen and examined.  ? ?Pt sitting in bed eating lunch, dressed up and can not wait to go home. He stated that he felt better today and able to walk with PT/OT in the hallway several rounds. BP has improved and now 130s. On midodrine '10mg'$  tid. Will continue DAPT for 3 months  and then ASA alone. Continue pravastatin 80. He will see Dr. TaCarles Colletn 04/13/21 for PD follow up but also consider northera for orthostasis management.  ? ?For detailed assessment and plan, please refer to above as I have made changes wherever appropriate.  ? ?Neurology will sign off. Please call with questions. [facility] Thanks for the consult. ? ? ?JiRosalin HawkingMD PhD ?Stroke Neurology ?04/02/2021 ?2:17 PM ? ? ? ? ? ?To contact Stroke Continuity provider, please refer to [email or web address] hours, contact General Neurology ? ?

## 2021-04-02 NOTE — TOC Transition Note (Signed)
Transition of Care (TOC) - CM/SW Discharge Note ? ? ?Patient Details  ?Name: Victor Osborne [facility] ?MRN: [id number] ?Date of Birth: [birth year] ? ?Transition of Care (TOC) CM/SW Contact:  ?Pollie Friar, RN ?Phone Number: ?04/02/2021, 1:12 PM ? ? ?Clinical Narrative:    ?Patient is discharging home with home health services through [place]. Information on the AVS.  ?No new DME needs.  ?Pt has transportation home. ? ? ?Final next level of care: [place] ?Barriers to Discharge: No Barriers Identified ? ? ?Patient Goals and CMS Choice ?  ?CMS Medicare.gov Compare Post Acute Care list provided to:: Patient ?Choice offered to / list presented to : Patient ? ?Discharge Placement ?  ?           ?  ?  ?  ?  ? ?Discharge Plan and Services ?  ?Discharge Planning Services: CM Consult ?Post Acute Care Choice: Home Health          ?  ?  ?  ?  ?  ?HH Arranged: PT, OT ?[place] Agency: [place] ?Date HH Agency Contacted: 03/31/21 ?  ?Representative spoke with at [place]: Tommi Rumps ? ?Social Determinants of Health (SDOH) Interventions ?  ? ? ?Readmission Risk Interventions ?No flowsheet data found. ? ? ? ? ?

## 2021-04-02 NOTE — Plan of Care (Signed)
Pt admitted for left side weakness which resulted in a fall at his home, CT test negative  and d/t placed pacemaker no MRI was done. Pt has been has participated in all therapies and has reached his stated goal. All personal possessions returned to patient/significant other at  bedside. Medication teaching of new medications, scheduled appointments, activities provided and patient/significant other verbalizes understanding. Discharged to home via car with significant other. ?

## 2021-04-02 NOTE — Progress Notes (Signed)
Physical Therapy Treatment ?Patient Details ?Name: Victor Osborne [facility] ?MRN: [id number] ?DOB: 18-Jun-1932 ?Today's Date: 04/02/2021 ? ? ?History of Present Illness 88 year old admitted 3/14  presented with left lower extremity weakness causing him to fall and hit the back of his head on a wall.  Continue to have numbness and tingling of his legs.  CT head was normal.  MRI incompatible pacemaker.  Admitted with TIA.  PMH:  sick sinus syndrome status post pacemaker, Parkinson's disease, bipolar disorder, hypertension hyperlipidemia ? ?  ?PT Comments  ? ? Pt has shown good improvement toward goals and should be safe with available assist.  Emphasis  on standing exercise and progression of gait stability with the RW. ?   ?Recommendations for follow up therapy are one component of a multi-disciplinary discharge planning process, led by the attending physician.  Recommendations may be updated based on patient status, additional functional criteria and insurance authorization. ? ?Follow Up Recommendations ? Home health PT ?  ?  ?Assistance Recommended at Discharge Intermittent Supervision/Assistance  ?Patient can return home with the following A little help with walking and/or transfers ?  ?Equipment Recommendations ? None recommended by PT  ?  ?Recommendations for Other Services   ? ? ?  ?Precautions / Restrictions Precautions ?Precautions: Fall ?Precaution Comments: watch s/s of falling BP  ?  ? ?Mobility ? Bed Mobility ?Overal bed mobility: Independent ?  ?  ?  ?  ?  ?  ?  ?  ? ?Transfers ?Overall transfer level: Needs assistance ?Equipment used: Rolling walker (2 wheels) ?Transfers: Sit to/from Stand ?Sit to Stand: Min guard ?  ?  ?  ?  ?  ?General transfer comment: cues for improved transfer safety with RW/rollator ?  ? ?Ambulation/Gait ?Ambulation/Gait assistance: Min guard ?Gait Distance (Feet): 150 Feet ?Assistive device: Rolling walker (2 wheels) ?Gait Pattern/deviations: Step-through pattern ?  ?Gait velocity  interpretation: 1.31 - 2.62 ft/sec, indicative of limited community ambulator ?  ?General Gait Details: Episodes of mild instability as he progressed, but no LOB and pt relayed feeling much more confident. ? ? ?Stairs ?  ?  ?  ?  ?  ? ? ?Wheelchair Mobility ?  ? ?Modified Rankin (Stroke Patients Only) ?Modified Rankin (Stroke Patients Only) ?Pre-Morbid Rankin Score: No symptoms ?Modified Rankin: Moderate disability ? ? ?  ?Balance Overall balance assessment: Needs assistance ?  ?Sitting balance-Leahy Scale: Good ?  ?  ?  ?Standing balance-Leahy Scale: Poor ?Standing balance comment: relies on UE support ?  ?  ?  ?  ?  ?  ?  ?  ?  ?  ?  ?  ? ?  ?Cognition Arousal/Alertness: Awake/alert ?Behavior During Therapy: [facility] for tasks assessed/performed ?Overall Cognitive Status: Within Functional Limits for tasks assessed ?  ?  ?  ?  ?  ?  ?  ?  ?  ?  ?  ?  ?  ?  ?  ?  ?  ?  ?  ? ?  ?Exercises General Exercises - Lower Extremity ?Hip ABduction/ADduction: AROM, Strengthening, Both, 10 reps, Standing ?Hip Flexion/Marching: AROM, Strengthening, Both, 10 reps, Standing ?Toe Raises: AROM, Both, 15 reps, Standing ?Heel Raises: AROM, Strengthening, Both, 10 reps, Standing ? ?  ?General Comments   ?  ?  ? ?Pertinent Vitals/Pain Pain Assessment ?Pain Assessment: No/denies pain ?Pain Intervention(s): Monitored during session  ? ? ?Home Living   ?  ?  ?  ?  ?  ?  ?  ?  ?  ?   ?  ?  Prior Function    ?  ?  ?   ? ?PT Goals (current goals can now be found in the care plan section) Acute Rehab PT Goals ?PT Goal Formulation: With patient ?Time For Goal Achievement: 04/14/21 ?Potential to Achieve Goals: Good ?Progress towards PT goals: Progressing toward goals ? ?  ?Frequency ? ? ? Min 4X/week ? ? ? ?  ?PT Plan Current plan remains appropriate  ? ? ?Co-evaluation   ?  ?  ?  ?  ? ?  ?AM-PAC PT "6 Clicks" Mobility   ?Outcome Measure ? Help needed turning from your back to your side while in a flat bed without using bedrails?: None ?Help needed  moving from lying on your back to sitting on the side of a flat bed without using bedrails?: None ?Help needed moving to and from a bed to a chair (including a wheelchair)?: A Little ?Help needed standing up from a chair using your arms (e.g., wheelchair or bedside chair)?: A Little ?Help needed to walk in hospital room?: A Little ?Help needed climbing 3-5 steps with a railing? : A Lot ?6 Click Score: 19 ? ?  ?End of Session   ?Activity Tolerance: Patient tolerated treatment well ?Patient left: in bed;with call bell/phone within reach;with bed alarm set;with family/visitor present ?Nurse Communication: Mobility status ?PT Visit Diagnosis: Other abnormalities of gait and mobility (R26.89);Difficulty in walking, not elsewhere classified (R26.2);Other symptoms and signs involving the nervous system (R29.898) ?  ? ? ?Time: 1610-9604 ?PT Time Calculation (min) (ACUTE ONLY): 25 min ? ?Charges:  $Gait Training: 8-22 mins ?$Therapeutic Exercise: 8-22 mins          ?          ? ?04/02/2021 ? ?Ginger Carne., PT ?Acute Rehabilitation Services ?[phone]  (pager) ?[phone]  (office) ? ? ?Tessie Fass Yadier Bramhall ?04/02/2021, 4:24 PM ? ?

## 2021-04-06 ENCOUNTER — Other Ambulatory Visit (HOSPITAL_COMMUNITY): Payer: Self-pay

## 2021-04-06 ENCOUNTER — Telehealth: Payer: Self-pay | Admitting: Family

## 2021-04-06 ENCOUNTER — Telehealth (HOSPITAL_BASED_OUTPATIENT_CLINIC_OR_DEPARTMENT_OTHER): Payer: Self-pay

## 2021-04-06 NOTE — Telephone Encounter (Signed)
Caller/Agency: Gerald Stabs from [place] ?Callback Number: [phone] ?Requesting OT/PT/Skilled Nursing/Social Work/Speech Therapy: PT  ?Frequency: 2 x 4, 1 x 2 ?

## 2021-04-06 NOTE — Telephone Encounter (Signed)
HH is requesting clarification on primary focus whether that be his parkinson's or his other diagnoses. Please advise.  ?

## 2021-04-06 NOTE — Telephone Encounter (Signed)
I have called and informed a rep from [facility] that we did not order the home health so we are fully sure what the focus is, however we dont think it is for Parkinsons. She stated understanding and also took the verbal order.  ?

## 2021-04-06 NOTE — Telephone Encounter (Signed)
Called and gave verbal orders per provider okay.  ?

## 2021-04-07 ENCOUNTER — Telehealth (HOSPITAL_COMMUNITY): Payer: Self-pay

## 2021-04-07 NOTE — Telephone Encounter (Signed)
Pharmacy Transitions of Care Follow-up Telephone Call ? ?Date of discharge: 04/02/21  ?Discharge Diagnosis: Left leg weakness ? ?How have you been since you were released from the hospital? Patient doing well since discharge, no questions about meds at this time.  ? ?Medication changes made at discharge: ?START taking these medications ? ?START taking these medications  ?aspirin 325 MG EC tablet ?Take 1 tablet (325 mg total) by mouth daily.  ?clopidogrel 75 MG tablet ?Commonly known as: PLAVIX ?Take 1 tablet (75 mg total) by mouth daily.  ?midodrine 10 MG tablet ?Commonly known as: PROAMATINE ?Take 1 tablet (10 mg total) by mouth 3 (three) times daily with meals.  ? ?CONTINUE taking these medications ? ?CONTINUE taking these medications  ?acetaminophen 325 MG tablet ?Commonly known as: TYLENOL  ?alclomethasone 0.05 % ointment ?Commonly known as: ACLOVATE  ?Bee Pollen 550 MG Caps  ?carbamazepine 200 MG tablet ?Commonly known as: TEGRETOL ?Take 1 tablet (200 mg total) by mouth 2 (two) times daily.  ?carbidopa-levodopa 25-100 MG tablet ?Commonly known as: SINEMET IR ?Take 2 tablets by mouth 3 (three) times daily.  ?cholecalciferol 1000 units tablet ?Commonly known as: VITAMIN D  ?CO Q 10 PO  ?desoximetasone 0.25 % cream ?Commonly known as: TOPICORT  ?ferrous sulfate 324 (65 Fe) MG Tbec  ?Flax Seed Oil 1300 MG Caps  ?FLUoxetine 10 MG capsule ?Commonly known as: PROZAC ?Take 1 capsule (10 mg total) by mouth daily.  ?levothyroxine 125 MCG tablet ?Commonly known as: Levothroid ?Take 1 tablet (125 mcg total) by mouth daily before breakfast.  ?MAGNESIUM PO  ?multivitamin with minerals Tabs tablet  ?OMEGA-3 CF PO  ?POTASSIUM GLUCONATE PO  ?pravastatin 40 MG tablet ?Commonly known as: PRAVACHOL ?Take 1 tablet (40 mg total) by mouth daily.  ?Probiotic-Prebiotic 1-250 BILLION-MG Caps  ?QUEtiapine 50 MG Tb24 24 hr tablet ?Commonly known as: SEROQUEL XR ?Take 1 tablet (50 mg total) by mouth at bedtime.  ?tamsulosin 0.4 MG Caps  capsule ?Commonly known as: FLOMAX  ?TURMERIC CURCUMIN PO  ? ? ?Medication changes verified by the patient? Yes ?  ? ?Medication Accessibility: ? ?Home Pharmacy: [facility]/Mail order ? ?Was the patient provided with refills on discharged medications? yes  ? ?Have all prescriptions been transferred from [facility] to home pharmacy? Patient uses [facility] order, cannot transfer. Patient knows to have Dr. Carles Collet send in new Rxs at f/u on 04/13/21  ? ?Is the patient able to afford medications? [employer] +generic ?  ? ?Medication Review: ?CLOPIDOGREL (PLAVIX) ?Clopidogrel 75 mg once daily.  ?- Advised patient of medications to avoid (NSAIDs, ASA)  ?- Educated that Tylenol (acetaminophen) will be the preferred analgesic to prevent risk of bleeding  ?- Emphasized importance of monitoring for signs and symptoms of bleeding (abnormal bruising, prolonged bleeding, nose bleeds, bleeding from gums, discolored urine, black tarry stools)  ?- Advised patient to alert all providers of anticoagulation therapy prior to starting a new medication or having a procedure  ? ?Follow-up Appointments: ?Date Visit Type Length Department   ? 04/13/2021  1:30 PM NEW PATIENT 60 60 min [place] [[id number]  ? ? ?If their condition worsens, is the pt aware to call PCP or go to the Emergency Dept.? Yes ? ?Final Patient Assessment: ?Patient has f/u scheduled and knows to get refills sent to home pharmacy at f/u  ?

## 2021-04-09 ENCOUNTER — Telehealth: Payer: Self-pay | Admitting: Neurology

## 2021-04-09 NOTE — Telephone Encounter (Signed)
Called [place] at [facility] and left voicemail to reach out to PCP ?

## 2021-04-09 NOTE — Telephone Encounter (Signed)
[place] from [facility] called and left a message requesting a call back with verbal orders for this patient for OT once a week for three weeks. ?

## 2021-04-12 ENCOUNTER — Telehealth: Payer: Self-pay

## 2021-04-12 ENCOUNTER — Encounter: Payer: Self-pay | Admitting: *Deleted

## 2021-04-12 ENCOUNTER — Other Ambulatory Visit: Payer: Self-pay | Admitting: *Deleted

## 2021-04-12 DIAGNOSIS — Z87898 Personal history of other specified conditions: Secondary | ICD-10-CM | POA: Insufficient documentation

## 2021-04-12 DIAGNOSIS — L821 Other seborrheic keratosis: Secondary | ICD-10-CM | POA: Insufficient documentation

## 2021-04-12 DIAGNOSIS — D225 Melanocytic nevi of trunk: Secondary | ICD-10-CM | POA: Insufficient documentation

## 2021-04-12 NOTE — Telephone Encounter (Signed)
Left message on machine verbal order ok. ?

## 2021-04-12 NOTE — Patient Outreach (Signed)
Received a red flag Emmi stroke notification for M . ?I have assigned Joellyn Quails, RN to call for follow up and determine if there are any Case Management needs.  ?  ?Arville Care, CBCS, CMAA ?[place] Management Assistant ?[place] Management ?[phone]   ?

## 2021-04-12 NOTE — Progress Notes (Signed)
? ? ?Assessment/Plan:  ? ?1.  Parkinsons Disease ? -Continue carbidopa/levodopa 25/100, 2 tablets 3 times per day ? -discussed PT.  I will send to [facility].   ? ?2.  History of well-controlled bipolar ? -Follows with psychiatry.  On Tegretol and Seroquel XR, 50 mg at bedtime ? ?3.  History of melanoma ? -Follows with Dr. Delman Cheadle ? -Knows that Parkinson's slightly increases risk for future melanomas as well. ? ?4.  Possible ACA infarct ? -Could not be confirmed because patients pacemaker is not MRI compatible. ? -CT did not demonstrate acute infarct. ? -Patient was treated as if he had possible right ACA infarct. ? -Patient to stay on dual antiplatelet therapy, aspirin 325 mg and Plavix, 75 mg for 3 months (June 17) and then discontinue the Plavix.  He will then be maintained on aspirin alone.  He and I reviewed these instructions clearly today and they were written out for him. ? -His pravastatin was suppposed to be increased to 80 mg by Dr. Erlinda Hong but hospitalist wrote that he thought that he didn't think needed increased.  He was close to goal with his LDL being 76 and goal being less than 70.  I will go ahead and increase given pravachol is not strong statin.  New prescription written today. ? ?5.  Orthostatic hypotension ? -While in the hospital, this was blamed on Parkinson's disease.  I think that this would be very unusual, given that his Parkinson's has really been quite stable and I have seen very little progression.  Parkinson's is certainly a common cause of orthostasis, but I am not convinced that this is the cause in him. ? [facility] started patient on midodrine 5 mg 3 times per day, titrating to 10 mg 3 times per day.  He asked me about potentially changing to Northera.  I really do not think that that is necessary.  Lauree Chandler is quite expensive and really has not seen any benefits.  Once we had this discussion, patient agreed, given cost issues.  He would not be able to afford it.  In addition, he really is  tolerating midodrine, 10 mg 3 times per day quite well. ? ? ?Subjective:  ? ?Victor Osborne was seen today in follow up for Parkinsons disease and more recent hospital f/u for possible stroke and orthostasis. My previous records were reviewed prior to todays visit as well as outside records available to me.  Patient presented to the emergency room March 30, 2021 with progression of increasing leg weakness (has chronic left leg weakness) with subsequent fall.  Patient states that he was at home and went to step in his pants while getting dressed and his left leg just did not hold him and he fell.  He tried to get up and was dragging the left leg.  He states that it was this way until he got to the hospital.  He was significantly orthostatic in the hospital and it was presumed that the fall was due to orthostatic hypotension.  He could not have an MRI as his pacemaker was not MRI compatible.  CT brain did not confirm acute infarct, but it was felt that he could possibly have had a right ACA infarct due to left ACA moderate stenosis.  The CTA of the head generally revealed mild atherosclerotic disease, but there was moderate stenosis in the very distal ACA.  As a side note, there was evidence of severe cervical spinal degeneration that was caught within the CT head/CTA head.  Patient's pacemaker was interrogated.  There was no evidence of atrial fibrillation.  His echocardiogram demonstrated ejection fraction 65 to 70%.  His LDL was 76.  His A1c was 5.1.  Was recommended that he stay on dual antiplatelet therapy with 325 mg aspirin and 75 mg Plavix for 3 months and then monotherapy.  Patient admits that he was under a lot of stress prior to hospitalization due to girlfriend issues. ? ?Current prescribed movement disorder medications: ?Carbidopa/levodopa 25/100, 2 tablets 3 times per day ? ? ?ALLERGIES:  No Known Allergies ? ?CURRENT MEDICATIONS:  ?Outpatient Encounter Medications as of 04/13/2021  ?Medication Sig   ? acetaminophen (TYLENOL) 325 MG tablet Take 650 mg by mouth every 6 (six) hours as needed.  ? alclomethasone (ACLOVATE) 0.05 % ointment Apply 1 application topically 2 (two) times daily as needed (for rash).  ? aspirin 325 MG EC tablet Take 1 tablet (325 mg total) by mouth daily.  ? Bacillus Coagulans-Inulin (PROBIOTIC-PREBIOTIC) 1-250 BILLION-MG CAPS Take 1 capsule by mouth daily.  ? Bee Pollen 550 MG CAPS Take 550 mg by mouth daily.  ? carbamazepine (TEGRETOL) 200 MG tablet Take 1 tablet (200 mg total) by mouth 2 (two) times daily.  ? carbidopa-levodopa (SINEMET IR) 25-100 MG tablet Take 2 tablets by mouth 3 (three) times daily.  ? cholecalciferol (VITAMIN D) 1000 UNITS tablet Take 2,000 Units by mouth daily.  ? clopidogrel (PLAVIX) 75 MG tablet Take 1 tablet (75 mg total) by mouth daily.  ? Coenzyme Q10 (CO Q 10 PO) Take 1 capsule by mouth daily.  ? desoximetasone (TOPICORT) 0.25 % cream Apply 1 application topically 2 (two) times daily.  ? ferrous sulfate 324 (65 Fe) MG TBEC Take 324 mg by mouth.  ? Flaxseed, Linseed, (FLAX SEED OIL) 1300 MG CAPS Take 1 capsule by mouth daily.  ? FLUoxetine (PROZAC) 10 MG capsule Take 1 capsule (10 mg total) by mouth daily.  ? levothyroxine (LEVOTHROID) 125 MCG tablet Take 1 tablet (125 mcg total) by mouth daily before breakfast.  ? MAGNESIUM PO Take 1 capsule by mouth daily.  ? midodrine (PROAMATINE) 10 MG tablet Take 1 tablet (10 mg total) by mouth 3 (three) times daily with meals.  ? Multiple Vitamin (MULITIVITAMIN WITH MINERALS) TABS Take 1 tablet by mouth daily.  ? Omega-3 Fatty Acids (OMEGA-3 CF PO) Take 1,200 mg by mouth daily.  ? POTASSIUM GLUCONATE PO Take 1 capsule by mouth daily.  ? pravastatin (PRAVACHOL) 40 MG tablet Take 1 tablet (40 mg total) by mouth daily.  ? QUEtiapine (SEROQUEL XR) 50 MG TB24 24 hr tablet Take 1 tablet (50 mg total) by mouth at bedtime.  ? tamsulosin (FLOMAX) 0.4 MG CAPS capsule Take 0.4 mg by mouth at bedtime.  ? TURMERIC CURCUMIN PO Take 1  capsule by mouth daily.  ? ?No facility-administered encounter medications on file as of 04/13/2021.  ? ? ?Objective:  ? ?PHYSICAL EXAMINATION:   ? ?VITALS:   ?Vitals:  ? 04/13/21 1315  ?BP: (!) 145/79  ?Pulse: 69  ?SpO2: 99%  ?Weight: 202 lb 6.4 oz (91.8 kg)  ?Height: '6\' 4"'$  (1.93 m)  ? ? ? ? ?GEN:  The patient appears stated age and is in NAD.  He is cheerful and joking. ?HEENT:  Normocephalic, atraumatic.  The mucous membranes are moist. The superficial temporal arteries are without ropiness or tenderness. ?CV:  RRR ?Lungs:  CTAB ?Neck/HEME:  There are no carotid bruits bilaterally. ? ?Neurological examination: ? ?Orientation: The patient is alert and  oriented x3. ?Cranial nerves: There is good facial symmetry with minimal facial hypomimia. The speech is fluent and clear. Soft palate rises symmetrically and there is no tongue deviation. Hearing is intact to conversational tone. ?Sensation: Sensation is intact to light touch throughout ?Motor: Strength is at least antigravity x4. ? ?Movement examination: ?Tone: There is nl tone in the UE/LE ?Abnormal movements: there is LUE rest tremor ?Coordination:  There is  decremation with RAM's, only with toe taps on the L (same as last visit) ?Gait and Station: The patient pushes off to arise.  He has rigged up his rollator so that it stands upright, and actually works quite well.  He walks very well with it. ? ?I have reviewed and interpreted the following labs independently ? ?  Chemistry   ?   ?Component Value Date/Time  ? NA 140 03/30/2021 1255  ? NA 143 01/16/2019 0838  ? K 4.4 03/30/2021 1255  ? CL 107 03/30/2021 1255  ? CO2 25 03/30/2021 1255  ? BUN 25 (H) 03/30/2021 1255  ? BUN 15 01/16/2019 0838  ? CREATININE 1.30 (H) 03/30/2021 1255  ? CREATININE 1.18 08/14/2020 1112  ?    ?Component Value Date/Time  ? CALCIUM 9.1 03/30/2021 1255  ? ALKPHOS 62 01/12/2021 1000  ? AST 16 01/12/2021 1000  ? ALT 4 01/12/2021 1000  ? BILITOT 0.6 01/12/2021 1000  ? BILITOT 0.3 01/16/2019  2376  ?  ? ? ? ?Lab Results  ?Component Value Date  ? WBC 8.2 03/30/2021  ? HGB 14.5 03/30/2021  ? HCT 44.0 03/30/2021  ? MCV 99.1 03/30/2021  ? PLT 178 03/30/2021  ? ? ?Lab Results  ?Component Value Date

## 2021-04-12 NOTE — Telephone Encounter (Signed)
Caller/Agency: [place] w/[facility] ?Callback Number: [phone] ?Requesting OT/PT/Skilled Nursing/Social Work/Speech Therapy: OT  ?Frequency: 1x a week for 3 weeks ?

## 2021-04-12 NOTE — Patient Outreach (Signed)
[place] [facility]) Care Management ?Telephonic RN Care Manager Note ? ? ?04/12/2021 ?Name:  Victor Osborne MRN:  [id number] DOB:  07-13-1932 ? ?Summary: ?Successful EMMI stroke follow up outreach ?Denies feeling worse Reports he "hit the wrong button" for the EMMI response ?"I am trying to be a good boy" ?Has a ride to his neurology appointment on 04/13/21 from a friend ?He confirms "two " home visits from [place] ? ?Recommendations/Changes made from today's visit: ?Assessed for worsening symptoms related to stroke ?social determinants of health (SDOH) assessment ?Assessed for home health services  ?Encouragement provided  ? ?He agrees to further EMMI stroke follow up outreach  ? ?Subjective: ?Victor Osborne is an 88 y.o. year old male who is a primary patient of Victor Osborne, Victor Osborne. The care management team was consulted for assistance with care management and/or care coordination needs.   ? ?Telephonic RN Care Manager completed Telephone Visit today.  ? ?Objective: ? ?Medications Reviewed Today   ? ? Reviewed by Barbaraann Faster, RN (Registered Nurse) on 04/12/21 at [facility] List Status: <None>  ? ?Medication Order Taking? Sig Documenting Provider Last Dose Status Informant  ?acetaminophen (TYLENOL) 325 MG tablet [id number] No Take 650 mg by mouth every 6 (six) hours as needed. [provider] 03/30/2021 Active Self  ?alclomethasone (ACLOVATE) 0.05 % ointment [id number] No Apply 1 application topically 2 (two) times daily as needed (for rash). [provider] 03/29/2021 Active Self  ?aspirin 325 MG EC tablet [id number]  Take 1 tablet (325 mg total) by mouth daily. Barb Merino, MD  Active   ?Bacillus Coagulans-Inulin (PROBIOTIC-PREBIOTIC) 1-250 BILLION-MG CAPS [id number] No Take 1 capsule by mouth daily. [provider] 03/29/2021 Active Self  ?Bee Pollen 550 MG CAPS [id number] No Take 550 mg by mouth daily. [provider] 03/29/2021 Active Self   ?carbamazepine (TEGRETOL) 200 MG tablet [id number] No Take 1 tablet (200 mg total) by mouth 2 (two) times daily. Thayer Headings, PMHNP 03/29/2021 Active Self  ?carbidopa-levodopa (SINEMET IR) 25-100 MG tablet [id number] No Take 2 tablets by mouth 3 (three) times daily. Ludwig Clarks, DO 03/29/2021 Active Self  ?cholecalciferol (VITAMIN D) 1000 UNITS tablet [id number] No Take 2,000 Units by mouth daily. [provider] 03/29/2021 Active Self  ?clopidogrel (PLAVIX) 75 MG tablet [id number]  Take 1 tablet (75 mg total) by mouth daily. Barb Merino, MD  Active   ?Coenzyme Q10 (CO Q 10 PO) [id number] No Take 1 capsule by mouth daily. [provider] 03/29/2021 Active Self  ?desoximetasone (TOPICORT) 0.25 % cream [id number] No Apply 1 application topically 2 (two) times daily. [provider] 03/29/2021 Active Self  ?ferrous sulfate 324 (65 Fe) MG TBEC [id number] No Take 324 mg by mouth. [provider] 03/29/2021 Active Self  ?Flaxseed, Linseed, (FLAX SEED OIL) 1300 MG CAPS [id number] No Take 1 capsule by mouth daily. [provider] 03/29/2021 Active Self  ?FLUoxetine (PROZAC) 10 MG capsule [id number] No Take 1 capsule (10 mg total) by mouth daily. Thayer Headings, PMHNP 03/29/2021 Active Self  ?levothyroxine (LEVOTHROID) 125 MCG tablet [id number] No Take 1 tablet (125 mcg total) by mouth daily before breakfast. Victor Salvage, FNP 03/29/2021 Active Self  ?MAGNESIUM PO [id number] No Take 1 capsule by mouth daily. [provider] 03/29/2021 Active Self  ?midodrine (PROAMATINE) 10 MG tablet [id number]  Take 1 tablet (10 mg total) by mouth 3 (three) times daily with meals. Barb Merino, MD  Active   ?Multiple Vitamin (MULITIVITAMIN WITH MINERALS) TABS  [id number] No Take 1 tablet by mouth daily. [provider] 03/29/2021 Active Self  ?Omega-3 Fatty Acids (OMEGA-3 CF PO) [id number] No Take 1,200 mg by mouth daily. [provider] 03/29/2021 Active Self  ?POTASSIUM  GLUCONATE PO [id number] No Take 1 capsule by mouth daily. [provider] 03/29/2021 Active Self  ?pravastatin (PRAVACHOL) 40 MG tablet [id number] No Take 1 tablet (40 mg total) by mouth daily. Victor Osborne, [place] 03/29/2021 Active Self  ?QUEtiapine (SEROQUEL XR) 50 MG TB24 24 hr tablet [id number] No Take 1 tablet (50 mg total) by mouth at bedtime. Thayer Headings, PMHNP 03/29/2021 Active Self  ?tamsulosin (FLOMAX) 0.4 MG CAPS capsule [id number] No Take 0.4 mg by mouth at bedtime. [provider] 03/29/2021 Active Self  ?TURMERIC CURCUMIN PO [id number] No Take 1 capsule by mouth daily. [provider] 03/29/2021 Active Self  ? ?  ?  ? ?  ? ? ? ?SDOH:  (Social Determinants of Health) assessments and interventions performed:  ?SDOH Interventions   ? ?Flowsheet Row Most Recent Value  ?SDOH Interventions   ?Food Insecurity Interventions Intervention Not Indicated  ?Financial Strain Interventions Intervention Not Indicated  ?Housing Interventions Intervention Not Indicated  ?Intimate Partner Violence Interventions Intervention Not Indicated  ?Stress Interventions Intervention Not Indicated  ?Transportation Interventions Intervention Not Indicated  ? ?  ? ? ?Care Plan ? ?Review of patient past medical history, allergies, medications, health status, including review of consultants reports, laboratory and other test data, was performed as part of comprehensive evaluation for care management services.  ? ?There are no care plans that you recently modified to display for this patient. ?  ? ?Plan: The patient has been provided with contact information for the care management team and has been advised to call with any health related questions or concerns.  ?The care management team will reach out to the patient again over the next 30+ business days. ? ?Carline Dura L. Lavina Hamman, RN, BSN, CCM ?[place] Management Care Coordinator ?Office number [phone] ?Main [facility] number [phone] ?Fax number  [phone] ? ? ? ? ? ?

## 2021-04-13 ENCOUNTER — Ambulatory Visit (INDEPENDENT_AMBULATORY_CARE_PROVIDER_SITE_OTHER): Payer: Medicare Other | Admitting: Neurology

## 2021-04-13 ENCOUNTER — Other Ambulatory Visit: Payer: Self-pay

## 2021-04-13 ENCOUNTER — Ambulatory Visit: Payer: Medicare Other | Admitting: Neurology

## 2021-04-13 VITALS — BP 145/79 | HR 69 | Ht 76.0 in | Wt 202.4 lb

## 2021-04-13 DIAGNOSIS — G20 Parkinson's disease: Secondary | ICD-10-CM | POA: Diagnosis not present

## 2021-04-13 DIAGNOSIS — I63421 Cerebral infarction due to embolism of right anterior cerebral artery: Secondary | ICD-10-CM

## 2021-04-13 DIAGNOSIS — G903 Multi-system degeneration of the autonomic nervous system: Secondary | ICD-10-CM

## 2021-04-13 MED ORDER — PRAVASTATIN SODIUM 80 MG PO TABS: 80.0000 mg | ORAL_TABLET | Freq: Every day | ORAL | 1 refills | Status: DC

## 2021-04-13 NOTE — Patient Instructions (Signed)
Take the Aspirin 325 mg with the Plavix, 75 mg until June 17.  At June 17, you will STOP the Plavix and continue the Aspirin ?Increase pravastatin to 80 mg daily ?Continue midodrine 10 mg three times per day ?

## 2021-04-15 ENCOUNTER — Telehealth: Payer: Self-pay | Admitting: Family

## 2021-04-15 NOTE — Telephone Encounter (Signed)
Noted and provider notified

## 2021-04-15 NOTE — Telephone Encounter (Signed)
[place] called from [place] [phone] ? ?Pt missed OT appointment for schedule conflict. Appointment r/s for 4.10.2023. ? ?Please advise.  ?

## 2021-04-19 ENCOUNTER — Other Ambulatory Visit: Payer: Self-pay

## 2021-04-19 ENCOUNTER — Telehealth: Payer: Self-pay | Admitting: Neurology

## 2021-04-19 ENCOUNTER — Other Ambulatory Visit: Payer: Self-pay | Admitting: *Deleted

## 2021-04-19 DIAGNOSIS — G20 Parkinson's disease: Secondary | ICD-10-CM

## 2021-04-19 MED ORDER — CARBIDOPA-LEVODOPA 25-100 MG PO TABS: 2.0000 | ORAL_TABLET | Freq: Three times a day (TID) | ORAL | 1 refills | Status: DC

## 2021-04-19 NOTE — Telephone Encounter (Signed)
1. Which medications need refilled? (List name and dosage, if known) carbidopa-levodopa ? ?2. Which pharmacy/location is medication to be sent to? (include street and city if Management consultant) [employer] [facility] [place] [facility] ? ?Pt has been out for 2 days. ?

## 2021-04-19 NOTE — Telephone Encounter (Signed)
Prescription has been refilled and let patient know the on call provider can always fill if he is out  ?

## 2021-04-19 NOTE — Patient Outreach (Signed)
[facility]) Care Management ?Telephonic RN Care Manager Note ? ? ?04/19/2021 ?Name:  Victor Osborne MRN:  [id number] DOB:  1932/09/03 ? ?Summary: ?Successful Stroke EMMI follow outreach to patient  ?He continues to do well and will be transferred to his Embedded RN CM services ?He confirms he attended his follow up visit to Dr Tat and that he is to return in June 2023 ?He denies worsening symptoms nor other needs  ?He agrees to outreach from [employer] CM   ? ?Recommendations/Changes made from today's visit: ?Assesses for worsening needs ?Discussed THN progression to Embedded RN CM ? ? ?Subjective: ?Victor Osborne is an 88 y.o. year old male who is a primary patient of [name], [place]. The care management team was consulted for assistance with care management and/or care coordination needs.   ? ?Telephonic RN Care Manager completed Telephone Visit today.  ? ?Objective: ? ?Medications Reviewed Today   ? ? Reviewed by Patricia Nettle, CMA (Certified Medical Assistant) on 04/13/21 at 1317  Med List Status: <None>  ? ?Medication Order Taking? Sig Documenting Provider Last Dose Status Informant  ?acetaminophen (TYLENOL) 325 MG tablet [id number]  Take 650 mg by mouth every 6 (six) hours as needed. [provider]  Active Self  ?alclomethasone (ACLOVATE) 0.05 % ointment [id number] Yes Apply 1 application topically 2 (two) times daily as needed (for rash). [provider] Taking Active Self  ?aspirin 325 MG EC tablet [id number] Yes Take 1 tablet (325 mg total) by mouth daily. Barb Merino, MD Taking Active   ?Bacillus Coagulans-Inulin (PROBIOTIC-PREBIOTIC) 1-250 BILLION-MG CAPS [id number] Yes Take 1 capsule by mouth daily. [provider] Taking Active Self  ?Bee Pollen 550 MG CAPS [id number] Yes Take 550 mg by mouth daily. [provider] Taking Active Self  ?carbamazepine (TEGRETOL) 200 MG tablet [id number] Yes Take 1 tablet (200 mg total) by mouth 2  (two) times daily. Thayer Headings, PMHNP Taking Active Self  ?carbidopa-levodopa (SINEMET IR) 25-100 MG tablet [id number]  Take 2 tablets by mouth 3 (three) times daily. Ludwig Clarks, DO  Active Self  ?cholecalciferol (VITAMIN D) 1000 UNITS tablet [id number]  Take 2,000 Units by mouth daily. [provider]  Active Self  ?clopidogrel (PLAVIX) 75 MG tablet [id number]  Take 1 tablet (75 mg total) by mouth daily. Barb Merino, MD  Active   ?Coenzyme Q10 (CO Q 10 PO) [id number]  Take 1 capsule by mouth daily. [provider]  Active Self  ?desoximetasone (TOPICORT) 0.25 % cream [id number]  Apply 1 application topically 2 (two) times daily. [provider]  Active Self  ?ferrous sulfate 324 (65 Fe) MG TBEC [id number]  Take 324 mg by mouth. [provider]  Active Self  ?Flaxseed, Linseed, (FLAX SEED OIL) 1300 MG CAPS [id number]  Take 1 capsule by mouth daily. [provider]  Active Self  ?FLUoxetine (PROZAC) 10 MG capsule [id number]  Take 1 capsule (10 mg total) by mouth daily. Thayer Headings, PMHNP  Active Self  ?levothyroxine (LEVOTHROID) 125 MCG tablet [id number]  Take 1 tablet (125 mcg total) by mouth daily before breakfast. [name], FNP  Active Self  ?MAGNESIUM PO [id number]  Take 1 capsule by mouth daily. [provider]  Active Self  ?midodrine (PROAMATINE) 10 MG tablet [id number]  Take 1 tablet (10 mg total) by mouth 3 (three) times daily with meals. Barb Merino, MD  Active   ?Multiple Vitamin (MULITIVITAMIN WITH MINERALS) TABS [id number]  Take 1 tablet by  mouth daily. [provider]  Active Self  ?Omega-3 Fatty Acids (OMEGA-3 CF PO) [id number]  Take 1,200 mg by mouth daily. [provider]  Active Self  ?POTASSIUM GLUCONATE PO [id number]  Take 1 capsule by mouth daily. [provider]  Active Self  ?pravastatin (PRAVACHOL) 40 MG tablet [id number]  Take 1 tablet (40 mg total) by mouth daily. [name], FNP  Active  Self  ?QUEtiapine (SEROQUEL XR) 50 MG TB24 24 hr tablet [id number]  Take 1 tablet (50 mg total) by mouth at bedtime. Thayer Headings, PMHNP  Active Self  ?tamsulosin (FLOMAX) 0.4 MG CAPS capsule [id number]  Take 0.4 mg by mouth at bedtime. [provider]  Active Self  ?TURMERIC CURCUMIN PO [id number]  Take 1 capsule by mouth daily. [provider]  Active Self  ? ?  ?  ? ?  ? ?Past Medical History:  ?Diagnosis Date  ? Bipolar 1 disorder ([place])   ? COPD (chronic obstructive pulmonary disease) ([place])   ? Depression   ? First degree AV block   ? Hypertension   ? pt denies but was in medical record  ? Hypoglycemia, unspecified   ? Myalgia and myositis, unspecified   ? Obesity   ? Pneumonia 1970  ? Skin cancer (melanoma) ([place])   ? Thyroid disease   ? Unspecified hypothyroidism   ? UPJ obstruction, congenital   ? ? ? ?SDOH:  (Social Determinants of Health) assessments and interventions performed:  ? ? ?Care Plan ? ?Review of patient past medical history, allergies, medications, health status, including review of consultants reports, laboratory and other test data, was performed as part of comprehensive evaluation for care management services.  ? ?There are no care plans that you recently modified to display for this patient. ?  ? ?Plan: The patient has been provided with contact information for the care management team and has been advised to call with any health related questions or concerns.  ?Transferred to Embedded nurse case manager ? ?Rawlin Reaume L. Lavina Hamman, RN, BSN, CCM ?[place] Management Care Coordinator ?Office number [phone] ?Main [facility] number [phone] ?Fax number [phone] ? ? ? ? ? ? ? ?

## 2021-04-20 ENCOUNTER — Telehealth: Payer: Self-pay | Admitting: *Deleted

## 2021-04-20 NOTE — Chronic Care Management (AMB) (Signed)
?  Care Management  ? ?Outreach Note ? ?04/20/2021 ?Name: Arley Salamone MRN: [id number] DOB: 10-09-1932 ? ?Referred by: [name], [place] ?Reason for referral : Care Coordination (Outreach to schedule initial with [facility] ) ? ? ?An unsuccessful telephone outreach was attempted today. The patient was referred to the case management team for assistance with care management and care coordination.  ? ?Follow Up Plan:  ?A HIPAA compliant phone message was left for the patient providing contact information and requesting a return call.  ? ?Ahlia Lemanski, CCMA ?Care Guide, Embedded Care Coordination ?[place]  Care Management  ?Direct Dial: [phone] ? ? ?

## 2021-05-03 ENCOUNTER — Telehealth: Payer: Self-pay | Admitting: *Deleted

## 2021-05-03 ENCOUNTER — Other Ambulatory Visit (HOSPITAL_COMMUNITY): Payer: Self-pay

## 2021-05-03 ENCOUNTER — Other Ambulatory Visit: Payer: Self-pay

## 2021-05-03 ENCOUNTER — Telehealth: Payer: Self-pay | Admitting: Family

## 2021-05-03 ENCOUNTER — Telehealth: Payer: Self-pay | Admitting: Neurology

## 2021-05-03 DIAGNOSIS — I63421 Cerebral infarction due to embolism of right anterior cerebral artery: Secondary | ICD-10-CM

## 2021-05-03 MED ORDER — MIDODRINE HCL 10 MG PO TABS: 10.0000 mg | ORAL_TABLET | Freq: Three times a day (TID) | ORAL | 1 refills | Status: DC

## 2021-05-03 NOTE — Chronic Care Management (AMB) (Signed)
?  Care Management  ? ?Outreach Note ? ?05/03/2021 ?Name: Victor Osborne MRN: [id number] DOB: 1932-09-17 ? ?Referred by: [name], [place] ?Reason for referral : Care Coordination (Outreach to schedule initial with [facility] ) ? ? ?A second unsuccessful telephone outreach was attempted today. The patient was referred to the case management team for assistance with care management and care coordination.  ? ?Follow Up Plan:  ?A HIPAA compliant phone message was left for the patient providing contact information and requesting a return call.  ? ?[name], CCMA ?Care Guide, Embedded Care Coordination ?[place]  Care Management  ?Direct Dial: [phone] ? ? ?

## 2021-05-03 NOTE — Telephone Encounter (Signed)
I reached out to patient and let him know we have not been the office that prescribed any of this medication. I looked in the chart and the doctor that prescribed them was [employer]. I called patient and let him know that we don't prescribe this medication for him but to reach out to this doctor and he told me that it was the doctor at the hospital that had prescribed them. I told him to reach out to cardiology or his PCP because we generally don't prescribe this medication. Patient said well it has to do with Parkinson's and I said they maybe but it sounds like this are cardiology related. He said his PCP only saw him for 15 min and wont prescribe this medication. He said to hell with it I just wont take it and I told him please don't do that just reach out to a different physician  ?

## 2021-05-03 NOTE — Telephone Encounter (Signed)
ERROR

## 2021-05-03 NOTE — Telephone Encounter (Signed)
Daughter is on the DPR I spoke with her and she demanded I send in the prescriptions immediately. I did some research and the aspirin and Plavix had not been transferred to the [facility] which the patients discharge papers pointed out that he had 11 refills of aspirin and 2 refills of the plavix remaining. I also had a call from the practice administrator from the patients PCP stating that it was our responsibility to refill the patients prescriptions from were he was released from the hospital. I had sent in the Midodrine before seeing Dr. Arturo Morton note and called and cancelled the Midodrine prescription as Dr,. Tat said that none of these prescriptions are the responsibility of our office. Patients daughter definitely changed tone when I called her to tell her I had assisted in transferring the medications that the patient failed to do so after discharge with the remaining refills already attached. Patients daughter did tell me she was concerned about her dad that since he had a stroke she was concerned he could have another without his medication.  I was told by the daughter that she was told by Estill Bamberg the Engineer, building services that our office had dropped the Samin Milke when Jake Seats had called and told the patient we would fill all prescriptions that the patient needed. I looked over the AVS and Dr. Carles Collet had never said we would be in charge of all the patients medications after leaving the hospital in fact it is stated that Dr. Carles Collet told patient that PCP would be in charge of prescribing Midodrine for the patient  ?

## 2021-05-03 NOTE — Telephone Encounter (Addendum)
Patient's daughter Barbera Setters called and said there has been an error and Dr. Carles Collet was supposed fill the patient's prescriptions for his two blood pressure medications and also aspirin. She said it was noted in the last AVS from his visit with Dr. Carles Collet. ? ?She'd like the medications sent in as soon as possible, he needs a dose this afternoon and also at bedtime. ?

## 2021-05-03 NOTE — Chronic Care Management (AMB) (Signed)
? ?  Spoke with pt daughter who says pt is still having orthostatic symptoms and has already spoken with someone who refilled the midodrine. Daughter appreciative of follow up.  ? ? ? ? ? ? ?Barb Merino, MD  Lynell Kussman, Mer[place]a [place], CMA ?If his symptoms have improved . He does not need to continue  ? ?Thank you   ?  ?   ?Previous Messages ?  ?----- Message -----  ?From: Massie Maroon, CMA  ?Sent: 05/03/2021   3:12 PM EDT  ?To: Barb Merino, MD  ?Subject: Midodrine                                      ? ?Good Afternoon,  ? ?This pt has asked his pcp and neuro to fill midodrine both denying a refill saying that they did not prescribe it, I seen its no longer listed on his medication list (says discontinued) and just wanted to clarify was he still supposed to be taking this after his recent hospital d/c, thank you for your help.  ? ?Destiny Trickey, CCMA  ?Care Guide, Embedded Care Coordination  ?[place]  Care Management  ?Direct Dial: [phone]  ?

## 2021-05-03 NOTE — Telephone Encounter (Signed)
Pt was started on midodrine in the hospital and is in need of a new rx  sent both locally and to [facility]. See 04/07/21 telephone note. Thank you! May also need the same for plavix.  ?

## 2021-05-03 NOTE — Telephone Encounter (Signed)
This patient called and stated he needs these medications, aspirin 325 mg, plavix 75 mg, and proamatine.  He said he is getting out of the hospital.  I do not see the last one in his chart.  He states he uses [facility] on [address]. ?

## 2021-05-03 NOTE — Telephone Encounter (Signed)
Daughter called and informed me that pt needs plavix and midodrine both sent to [facility]) for a 30 ds and a longer supply sent to [facility]. States her dad has fallen (yesterday) and needs the medication today please.  ?

## 2021-05-03 NOTE — Telephone Encounter (Signed)
Pt called stating he needed refills on several medications that were prescribed to him after his visit to the hospital around mid-March and that he didn't have enough to last him to the Hospital Follow Up with Mickel Baas on 4.21.2023. Please advise. ? ?Medication:  ? ?Rx #: [id number]  ?midodrine (PROAMATINE) 10 MG tablet [[id number]]  ? ?Rx #: [id number]  ?aspirin 325 MG EC tablet [[id number]]  ? ?Rx #: [id number]  ?clopidogrel (PLAVIX) 75 MG tablet [[id number]]  ? ?Has the patient contacted their pharmacy? No. ?(If no, request that the patient contact the pharmacy for the refill.) ?(If yes, when and what did the pharmacy advise?) ? ?Preferred Pharmacy (with phone number or street name):  ? ?[id number] - [place], [place] - [address]  ?[address], [place] [address]  ?Phone:  [phone]  Fax:  [phone]  ? ?Agent: Please be advised that RX refills may take up to 3 business days. We ask that you follow-up with your pharmacy.  ?

## 2021-05-04 ENCOUNTER — Telehealth: Payer: Self-pay | Admitting: Neurology

## 2021-05-04 NOTE — Telephone Encounter (Signed)
Patient called back.  He stated he needed his medication.   ?

## 2021-05-04 NOTE — Telephone Encounter (Signed)
I called [name], FNP to discuss issues.  She was not in office yesterday either.  She stated that she did not know the patient well, only having seen him 1 time ever.  Discussed with her that, fortunately, I actually knew the patient quite well and have seen him for many years.  Within that many years, his Parkinson's has not really changed.  As documented in my last notes, I did not think that the orthostatic hypotension was related to the Parkinson's.  Discussed with her that I generally do not prescribe midodrine unless orthostasis is related to Parkinson's disease.  She stated that she does not prescribe midodrine generally.  We discussed that it would be unusual for neurology to treat blood pressure, unless it is related to our specific disease state, in his case Parkinson's disease.  His provider noted that she would give the patient enough medication to get to a cardiology referral. ? ?I subsequently called the patient as he has called here a few times today already.  He stated that primary care has since called him and made an appointment for Friday.  They told him that he could stop his midodrine until Friday and they would reassess whether or not he needed it (also documented in phone record).  Patient and I discussed that we have always had a very good working relationship, and I completely understand his frustration yesterday, given that both his primary care and myself were out of the office.  However, I did discuss with him that speaking abusively to my staff would not be tolerated in my office.  Staff noted that most of this came from the patient's daughter.  Discussed with patient that I would like to keep him as a patient given our good working relationship, but that his daughter, unfortunately, would not be welcome in our office given how staff was treated abusively Regulatory affairs officer, Psychologist, sport and exercise, Glass blower/designer).  Patient was extremely apologetic, asking if he could call our staff  to apologize.  I accepted his apology fully and told him that it was not necessary to call anyone, but that I would pass his apology to the affected office staff.  He was appreciative for the direct phone call from me and thanked me, stating that "you have always been good to me and treated me well and with respect." ?

## 2021-05-04 NOTE — Telephone Encounter (Signed)
Kahiau called the office, he wants to make an appt to see Tat, I told him tat was going to call him sometime today. He said okay and hung up ?

## 2021-05-04 NOTE — Telephone Encounter (Signed)
Spoke to patient and he is aware that Victor Osborne will determine if he still needs to take the medication or not. He stated he would wait until his 04/21 appointment to discuss it with her.  ?

## 2021-05-04 NOTE — Telephone Encounter (Signed)
Patient called stating they canceled his midodrine medication right before he went to pick it up. He is unsure if he should continue trying to get it or just forget about it. Please advise.  ?

## 2021-05-04 NOTE — Chronic Care Management (AMB) (Signed)
Im assuming this will be discussed at his upcoming visit this Friday since he is still symptomatic.  ? ? ? ? ?Barb Merino, MD  Jeannia Tatro, [place], CMA ?Not sure I replied to your message. If his symptoms have improved , he does not need to take it . PCP will determine that in follow up . It is ok to stop this if no more needed .  ? ?Thank you  ? ?Barb Merino MD  ?

## 2021-05-07 ENCOUNTER — Ambulatory Visit (INDEPENDENT_AMBULATORY_CARE_PROVIDER_SITE_OTHER): Payer: Medicare Other | Admitting: Family

## 2021-05-07 ENCOUNTER — Encounter: Payer: Self-pay | Admitting: Family

## 2021-05-07 VITALS — BP 118/72 | HR 66 | Temp 97.5°F | Ht 76.0 in | Wt 201.0 lb

## 2021-05-07 DIAGNOSIS — R29898 Other symptoms and signs involving the musculoskeletal system: Secondary | ICD-10-CM | POA: Diagnosis not present

## 2021-05-07 DIAGNOSIS — I159 Secondary hypertension, unspecified: Secondary | ICD-10-CM | POA: Diagnosis not present

## 2021-05-07 MED ORDER — ASPIRIN 325 MG PO TBEC: 325.0000 mg | DELAYED_RELEASE_TABLET | Freq: Every day | ORAL | 3 refills | Status: DC

## 2021-05-07 MED ORDER — CLOPIDOGREL BISULFATE 75 MG PO TABS: 75.0000 mg | ORAL_TABLET | Freq: Every day | ORAL | 0 refills | Status: AC

## 2021-05-07 NOTE — Telephone Encounter (Signed)
Concerns were addressed at office visit and home health orders have been signed and sent to [facility].  ?

## 2021-05-07 NOTE — Patient Instructions (Signed)
Take the Aspirin 325 mg with the Plavix, 75 mg until June 17.  At June 17, you will STOP the Plavix and continue the Aspirin ?

## 2021-05-07 NOTE — Progress Notes (Signed)
?Victor Osborne is a 88 y.o. male with the following history as recorded in EpicCare:  ?Patient Active Problem List  ? Diagnosis Date Noted  ? History of neoplasm 04/12/2021  ? Melanocytic nevi of trunk 04/12/2021  ? Other seborrheic keratosis 04/12/2021  ? Cerebral embolism with cerebral infarction 03/31/2021  ? Left leg weakness 03/30/2021  ? Chronic post-traumatic stress disorder 12/05/2017  ? Bipolar I disorder, current or most recent episode depressed, in partial remission ([place]) 12/05/2017  ? CHB (complete heart block) ([place]) 01/27/2017  ? HTN (hypertension) 01/27/2017  ? Hypothyroidism 10/11/2016  ? Parkinson disease ([place]) 09/08/2016  ? Pacemaker 09/24/2013  ? SSS (sick sinus syndrome) ([place]) 06/19/2013  ? First degree AV block 04/09/2013  ? Allergic rhinitis 03/17/2013  ? Obstructive sleep apnea 12/18/2006  ? COPD mixed type ([place]) 12/18/2006  ? History of malignant melanoma of skin 12/18/2006  ? Diabetes mellitus ([place]) 12/18/2006  ?  ?Current Outpatient Medications  ?Medication Sig Dispense Refill  ? acetaminophen (TYLENOL) 325 MG tablet Take 650 mg by mouth every 6 (six) hours as needed.    ? alclomethasone (ACLOVATE) 0.05 % ointment Apply 1 application topically 2 (two) times daily as needed (for rash).    ? Bacillus Coagulans-Inulin (PROBIOTIC-PREBIOTIC) 1-250 BILLION-MG CAPS Take 1 capsule by mouth daily.    ? Bee Pollen 550 MG CAPS Take 550 mg by mouth daily.    ? carbamazepine (TEGRETOL) 200 MG tablet Take 1 tablet (200 mg total) by mouth 2 (two) times daily. 180 tablet 1  ? carbidopa-levodopa (SINEMET IR) 25-100 MG tablet Take 2 tablets by mouth 3 (three) times daily. 540 tablet 1  ? cholecalciferol (VITAMIN D) 1000 UNITS tablet Take 2,000 Units by mouth daily.    ? Coenzyme Q10 (CO Q 10 PO) Take 1 capsule by mouth daily.    ? desoximetasone (TOPICORT) 0.25 % cream Apply 1 application topically 2 (two) times daily.    ? ferrous sulfate 324 (65 Fe) MG TBEC Take 324 mg by mouth.    ? Flaxseed,  Linseed, (FLAX SEED OIL) 1300 MG CAPS Take 1 capsule by mouth daily.    ? FLUoxetine (PROZAC) 10 MG capsule Take 1 capsule (10 mg total) by mouth daily. 90 capsule 1  ? levothyroxine (LEVOTHROID) 125 MCG tablet Take 1 tablet (125 mcg total) by mouth daily before breakfast. 90 tablet 3  ? MAGNESIUM PO Take 1 capsule by mouth daily.    ? Multiple Vitamin (MULITIVITAMIN WITH MINERALS) TABS Take 1 tablet by mouth daily.    ? Omega-3 Fatty Acids (OMEGA-3 CF PO) Take 1,200 mg by mouth daily.    ? POTASSIUM GLUCONATE PO Take 1 capsule by mouth daily.    ? pravastatin (PRAVACHOL) 80 MG tablet Take 1 tablet (80 mg total) by mouth daily. 90 tablet 1  ? QUEtiapine (SEROQUEL XR) 50 MG TB24 24 hr tablet Take 1 tablet (50 mg total) by mouth at bedtime. 90 tablet 1  ? tamsulosin (FLOMAX) 0.4 MG CAPS capsule Take 0.4 mg by mouth at bedtime.    ? TURMERIC CURCUMIN PO Take 1 capsule by mouth daily.    ? aspirin 325 MG EC tablet Take 1 tablet (325 mg total) by mouth daily. 90 tablet 3  ? clopidogrel (PLAVIX) 75 MG tablet Take 1 tablet (75 mg total) by mouth daily. 30 tablet 0  ? ?No current facility-administered medications for this visit.  ?  ?Allergies: Patient has no known allergies.  ?Past Medical History:  ?Diagnosis Date  ?  Bipolar 1 disorder ([place])   ? COPD (chronic obstructive pulmonary disease) ([place])   ? Depression   ? First degree AV block   ? Hypertension   ? pt denies but was in medical record  ? Hypoglycemia, unspecified   ? Myalgia and myositis, unspecified   ? Obesity   ? Pneumonia 1970  ? Skin cancer (melanoma) ([place])   ? Thyroid disease   ? Unspecified hypothyroidism   ? UPJ obstruction, congenital   ?  ?Past Surgical History:  ?Procedure Laterality Date  ? APPENDECTOMY  1939  ? malignant melanoma removed from right forehead  2008  ? PERMANENT PACEMAKER INSERTION N/A 06/19/2013  ? Procedure: PERMANENT PACEMAKER INSERTION;  Surgeon: Evans Lance, MD;  Location: [facility] CATH LAB;  Service: Cardiovascular;  Laterality: N/A;  ?  right big toe  surgery  2009  ? TRANSURETHRAL RESECTION OF PROSTATE  02/04/2011  ? Procedure: TRANSURETHRAL RESECTION OF THE PROSTATE (TURP);  Surgeon: Dutch Gray, MD;  Location: [facility] ORS;  Service: Urology;  Laterality: N/A;  ? URETHRA SURGERY  2010  ? reconstruction  ?  ?Family History  ?Problem Relation Age of Onset  ? Cerebral aneurysm Mother   ? Lung cancer Father   ? Drug abuse Daughter   ?  ?Social History  ? ?Tobacco Use  ? Smoking status: Former  ?  Packs/day: 2.00  ?  Years: 40.00  ?  Pack years: 80.00  ?  Types: Cigarettes  ? Smokeless tobacco: Never  ?Substance Use Topics  ? Alcohol use: No  ?  ?Subjective:  ? ?Patient presents to discuss if Midodrine needs to be continued; was started at recent hospital stay as orthostatic hypotension thought to be related to Parkinson's Disease; patient's neurologist disagreed and did not think Parkinson's was causative; patient has now been off Midodrine for the past week and doing well- has been checking blood pressure at home and averaging 126/75;  ? ?Will be going to pick up Plavix today and understands he stays on this for 2 more months with ASA 325 mg daily and then will just be on ASA daily;  ? ? ? ?  ?Objective:  ?Vitals:  ? 05/07/21 1118  ?BP: 118/72  ?Pulse: 66  ?Temp: (!) 97.5 ?F (36.4 ?C)  ?TempSrc: Oral  ?SpO2: 98%  ?Weight: 201 lb (91.2 kg)  ?Height: '6\' 4"'$  (1.93 m)  ?  ?General: Well developed, well nourished, in no acute distress  ?Skin : Warm and dry.  ?Head: Normocephalic and atraumatic  ?Lungs: Respirations unlabored; clear to auscultation bilaterally without wheeze, rales, rhonchi  ?CVS exam: normal rate and regular rhythm.  ?Neurologic: Alert and oriented; speech intact; face symmetrical; uses walker for stability; ? ?Assessment:  ?1. Left leg weakness   ?2. Secondary hypertension   ?  ?Plan:  ?Will hold Midodrine for now- he will continue to monitor his blood pressure; will complete home health orders to continue PT/OT at home as originally ordered  after hospital stay;  ?He understands to stay on Plavix through June in combination for ASA and then stay on ASA alone going forward; ? ?This visit occurred during the SARS-CoV-2 public health emergency.  Safety protocols were in place, including screening questions prior to the visit, additional usage of staff PPE, and extensive cleaning of exam room while observing appropriate contact time as indicated for disinfecting solutions.  ? ? ?Return in about 6 months (around 11/06/2021).  ?No orders of the defined types were placed in this encounter. ?  ?  Requested Prescriptions  ? ?Signed Prescriptions Disp Refills  ? clopidogrel (PLAVIX) 75 MG tablet 30 tablet 0  ?  Sig: Take 1 tablet (75 mg total) by mouth daily.  ? aspirin 325 MG EC tablet 90 tablet 3  ?  Sig: Take 1 tablet (325 mg total) by mouth daily.  ?  ? ?

## 2021-05-11 ENCOUNTER — Emergency Department (HOSPITAL_BASED_OUTPATIENT_CLINIC_OR_DEPARTMENT_OTHER): Payer: Medicare Other

## 2021-05-11 ENCOUNTER — Emergency Department (HOSPITAL_BASED_OUTPATIENT_CLINIC_OR_DEPARTMENT_OTHER)
Admission: EM | Admit: 2021-05-11 | Discharge: 2021-05-11 | Disposition: A | Discharge status: Home / Self Care | Payer: Medicare Other | Attending: Emergency Medicine | Admitting: Emergency Medicine

## 2021-05-11 ENCOUNTER — Other Ambulatory Visit: Payer: Self-pay

## 2021-05-11 ENCOUNTER — Encounter (HOSPITAL_BASED_OUTPATIENT_CLINIC_OR_DEPARTMENT_OTHER): Payer: Self-pay

## 2021-05-11 DIAGNOSIS — J449 Chronic obstructive pulmonary disease, unspecified: Secondary | ICD-10-CM | POA: Insufficient documentation

## 2021-05-11 DIAGNOSIS — Z7982 Long term (current) use of aspirin: Secondary | ICD-10-CM | POA: Diagnosis not present

## 2021-05-11 DIAGNOSIS — W19XXXD Unspecified fall, subsequent encounter: Secondary | ICD-10-CM | POA: Diagnosis not present

## 2021-05-11 DIAGNOSIS — Z7902 Long term (current) use of antithrombotics/antiplatelets: Secondary | ICD-10-CM | POA: Insufficient documentation

## 2021-05-11 DIAGNOSIS — G20 Parkinson's disease: Secondary | ICD-10-CM | POA: Insufficient documentation

## 2021-05-11 DIAGNOSIS — Z79899 Other long term (current) drug therapy: Secondary | ICD-10-CM | POA: Diagnosis not present

## 2021-05-11 DIAGNOSIS — E039 Hypothyroidism, unspecified: Secondary | ICD-10-CM | POA: Diagnosis not present

## 2021-05-11 DIAGNOSIS — M25531 Pain in right wrist: Secondary | ICD-10-CM | POA: Insufficient documentation

## 2021-05-11 DIAGNOSIS — I10 Essential (primary) hypertension: Secondary | ICD-10-CM | POA: Diagnosis not present

## 2021-05-11 NOTE — ED Provider Notes (Signed)
?[facility] EMERGENCY DEPARTMENT ?Provider Note ? ? ?CSN: [id number] ?Arrival date & time: 05/11/21  1540 ? ?  ? ?History ? ?Chief Complaint  ?Patient presents with  ? Wrist Pain  ? ? ?Victor Osborne is a 88 y.o. male. ? ?The history is provided by the patient and medical records. No language interpreter was used.  ?Wrist Pain ?This is a new problem. The current episode started more than 1 week ago. The problem occurs constantly. The problem has been gradually worsening. Pertinent negatives include no chest pain, no abdominal pain, no headaches and no shortness of breath. The symptoms are aggravated by bending. Nothing relieves the symptoms. He has tried nothing for the symptoms. The treatment provided no relief.  ? ?  ? ?Home Medications ?Prior to Admission medications   ?Medication Sig Start Date End Date Taking? Authorizing Provider  ?acetaminophen (TYLENOL) 325 MG tablet Take 650 mg by mouth every 6 (six) hours as needed.    [provider]  ?alclomethasone (ACLOVATE) 0.05 % ointment Apply 1 application topically 2 (two) times daily as needed (for rash).    [provider]  ?aspirin 325 MG EC tablet Take 1 tablet (325 mg total) by mouth daily. 05/07/21   [name], [place]  ?Bacillus Coagulans-Inulin (PROBIOTIC-PREBIOTIC) 1-250 BILLION-MG CAPS Take 1 capsule by mouth daily.    [provider]  ?Bee Pollen 550 MG CAPS Take 550 mg by mouth daily.    [provider]  ?carbamazepine (TEGRETOL) 200 MG tablet Take 1 tablet (200 mg total) by mouth 2 (two) times daily. 12/03/20   Thayer Headings, PMHNP  ?carbidopa-levodopa (SINEMET IR) 25-100 MG tablet Take 2 tablets by mouth 3 (three) times daily. 04/19/21   Tat, Eustace Quail, DO  ?cholecalciferol (VITAMIN D) 1000 UNITS tablet Take 2,000 Units by mouth daily.    [provider]  ?clopidogrel (PLAVIX) 75 MG tablet Take 1 tablet (75 mg total) by mouth daily. 05/07/21 08/05/21  [name], [place]   ?Coenzyme Q10 (CO Q 10 PO) Take 1 capsule by mouth daily.    [provider]  ?desoximetasone (TOPICORT) 0.25 % cream Apply 1 application topically 2 (two) times daily.    [provider]  ?ferrous sulfate 324 (65 Fe) MG TBEC Take 324 mg by mouth.    [provider]  ?Flaxseed, Linseed, (FLAX SEED OIL) 1300 MG CAPS Take 1 capsule by mouth daily.    [provider]  ?FLUoxetine (PROZAC) 10 MG capsule Take 1 capsule (10 mg total) by mouth daily. 12/03/20   Thayer Headings, PMHNP  ?levothyroxine (LEVOTHROID) 125 MCG tablet Take 1 tablet (125 mcg total) by mouth daily before breakfast. 01/12/21   [name], [place]  ?MAGNESIUM PO Take 1 capsule by mouth daily.    [provider]  ?Multiple Vitamin (MULITIVITAMIN WITH MINERALS) TABS Take 1 tablet by mouth daily.    [provider]  ?Omega-3 Fatty Acids (OMEGA-3 CF PO) Take 1,200 mg by mouth daily.    [provider]  ?POTASSIUM GLUCONATE PO Take 1 capsule by mouth daily.    [provider]  ?pravastatin (PRAVACHOL) 80 MG tablet Take 1 tablet (80 mg total) by mouth daily. 04/13/21   Ludwig Clarks, DO  ?QUEtiapine (SEROQUEL XR) 50 MG TB24 24 hr tablet Take 1 tablet (50 mg total) by mouth at bedtime. 12/03/20   Thayer Headings, PMHNP  ?tamsulosin (FLOMAX) 0.4 MG CAPS capsule Take 0.4 mg by mouth at bedtime. 02/15/21  [provider]  ?TURMERIC CURCUMIN PO Take 1 capsule by mouth daily.    [provider]  ?   ? ?Allergies    ?Patient has no known allergies.   ? ?Review of Systems   ?Review of Systems  ?Constitutional:  Negative for chills, diaphoresis, fatigue and fever.  ?Respiratory:  Negative for cough, chest tightness, shortness of breath and wheezing.   ?Cardiovascular:  Negative for chest pain.  ?Gastrointestinal:  Negative for abdominal pain, constipation, diarrhea, nausea and vomiting.  ?Genitourinary:  Negative for dysuria and flank pain.  ?Musculoskeletal:  Negative  for back pain, neck pain and neck stiffness.  ?Skin:  Negative for rash and wound.  ?Neurological:  Negative for weakness, light-headedness, numbness and headaches.  ?Psychiatric/Behavioral:  Negative for agitation and confusion.   ?All other systems reviewed and are negative. ? ?Physical Exam ?Updated Vital Signs ?BP (!) 142/74 (BP Location: Left Arm)   Pulse 66   Temp 98 ?F (36.7 ?C) (Oral)   Resp 18   Wt 91.2 kg   SpO2 98%   BMI 24.47 kg/m?  ?Physical Exam ?Vitals and nursing note reviewed.  ?Constitutional:   ?   General: He is not in acute distress. ?   Appearance: He is well-developed. He is not ill-appearing, toxic-appearing or diaphoretic.  ?HENT:  ?   Head: Normocephalic and atraumatic.  ?   Mouth/Throat:  ?   Mouth: Mucous membranes are moist.  ?Eyes:  ?   Conjunctiva/sclera: Conjunctivae normal.  ?Cardiovascular:  ?   Rate and Rhythm: Normal rate and regular rhythm.  ?   Heart sounds: No murmur heard. ?Pulmonary:  ?   Effort: Pulmonary effort is normal. No respiratory distress.  ?   Breath sounds: Normal breath sounds. No wheezing, rhonchi or rales.  ?Chest:  ?   Chest wall: No tenderness.  ?Abdominal:  ?   General: Abdomen is flat. There is no distension.  ?   Palpations: Abdomen is soft.  ?   Tenderness: There is no abdominal tenderness. There is no right CVA tenderness, left CVA tenderness, guarding or rebound.  ?Musculoskeletal:     ?   General: Tenderness and signs of injury present. No swelling or deformity.  ?   Right wrist: Tenderness present. No lacerations, bony tenderness or snuff box tenderness. Normal pulse.  ?   Cervical back: Neck supple.  ?   Right lower leg: No edema.  ?   Left lower leg: No edema.  ?   Comments: Intact sensation, strength, and pulses in right hand.  Good cap refill.  Normal sensation.  Range of motion of wrist limited due to pain and he has a mild amount of swelling.  Otherwise exam unremarkable in the arm.  ?Skin: ?   General: Skin is warm and dry.  ?   Capillary  Refill: Capillary refill takes less than 2 seconds.  ?   Findings: No erythema or rash.  ?Neurological:  ?   Mental Status: He is alert. Mental status is at baseline.  ?Psychiatric:     ?   Mood and Affect: Mood normal.  ? ? ?ED Results / Procedures / Treatments   ?Labs ?(all labs ordered are listed, but only abnormal results are displayed) ?Labs Reviewed - No data to display ? ?EKG ?None ? ?Radiology ?DG Wrist Complete Right ? ?Result Date: 05/11/2021 ?CLINICAL DATA:  Pain, fall 2 weeks earlier EXAM: RIGHT WRIST - COMPLETE 3+ VIEW COMPARISON:  03/11/2019 FINDINGS: No definite recent fracture or  dislocation is seen. There is interval healing of fracture seen in the lateral aspect of distal radius since 03/11/2019. There is smooth marginated calcific density adjacent to the dorsal aspect of triquetrum, possibly old ununited avulsion. Degenerative changes are noted in the intercarpal joints and first carpometacarpal joint. Subcortical cysts are noted in multiple carpal bones, most likely due to degenerative arthritis. Degenerative changes with bony spurs are also noted in the first metacarpophalangeal joint. IMPRESSION: No definite recent fracture or dislocation is seen in the right wrist. Significant degenerative changes are noted in the intercarpal and first carpometacarpal joints with interval worsening. Degenerative changes are noted in first metacarpophalangeal joint. Electronically Signed   By: Elmer Picker M.D.   On: 05/11/2021 16:27   ? ?Procedures ?Procedures  ? ? ?Medications Ordered in ED ?Medications - No data to display ? ?ED Course/ Medical Decision Making/ A&P ?  ?                        ?Medical Decision Making ?Amount and/or Complexity of Data Reviewed ?Radiology: ordered. ? ? ? ?Victor Osborne is a right handed 88 y.o. male with a past medical history significant for sick sinus syndrome with complete heart block status post pacemaker, Parkinson's, previous stroke, hypertension,  hypothyroidism, COPD, and orthostatic hypotension who presents with right wrist pain after a fall 2 weeks ago.  According to patient, when he stands up quickly he gets lightheaded and had a fall 2 weeks ago when he did this

## 2021-05-11 NOTE — Discharge Instructions (Signed)
Your history, exam, and work-up today did not reveal evidence of acute fracture or dislocation in the right wrist after the fall 2 weeks ago.  As you have had previous wrist injury and seen [facility] with Dr. Grandville Silos in the past, please call to schedule a follow-up appointment with them.  The x-rays did not show fractures but as we discussed, there could be other tenderness or ligamentous injuries leading to your discomfort.  Please continue using the wrist brace and using over-the-counter medication to help with the discomfort.  Please be careful not to have any further lightheadedness or fall episodes.  Please rest and stay hydrated.  If any symptoms change or worsen acutely, please return to the nearest emergency department. ?

## 2021-05-11 NOTE — ED Triage Notes (Signed)
Reports right wrist pain from fall 2 weeks ago. Pain is constant now ?

## 2021-05-12 ENCOUNTER — Telehealth: Payer: Self-pay

## 2021-05-12 NOTE — Telephone Encounter (Signed)
I received a Advertising account executive message from [place] at [facility]. Gerald Stabs has been working with patient since his stroke and release from the hospital with his stroke like symptoms. They would like to move patient to outpatient rehab focusing on his parkinson's disease. They would like a verbal order and send paperwork to do Outpatient therapy for Parkinson's disease. Needing authorization from Dr. Carles Collet to provide any orders  ?

## 2021-05-12 NOTE — Telephone Encounter (Signed)
I looked in his chart. I sent referral to [facility] and they have him scheduled but not until 08/12/21 for his screen  ?

## 2021-05-14 ENCOUNTER — Ambulatory Visit (INDEPENDENT_AMBULATORY_CARE_PROVIDER_SITE_OTHER): Payer: Medicare Other

## 2021-05-14 VITALS — Ht 76.0 in | Wt 201.0 lb

## 2021-05-14 DIAGNOSIS — Z0000 Encounter for general adult medical examination without abnormal findings: Secondary | ICD-10-CM

## 2021-05-14 NOTE — Patient Instructions (Signed)
Mr. Breau , ?Thank you for taking time to come for your Medicare Wellness Visit. I appreciate your ongoing commitment to your health goals. Please review the following plan we discussed and let me know if I can assist you in the future.  ? ?Screening recommendations/referrals: ?Colonoscopy: No longer required due to age. ? ?Recommended yearly ophthalmology/optometry visit for glaucoma screening and checkup ?Recommended yearly dental visit for hygiene and checkup ? ?Vaccinations: ?Influenza vaccine: Done 09/28/2020 Repeat annually ? ?Pneumococcal vaccine: Done 09/08/2016 and 10/28/2011. ?Tdap vaccine: Done 06/18/2011 Repeat in 10 years ? ?Shingles vaccine: Discussed.    ?Covid-19: Done 11/18/2020.  ? ?Advanced directives: Please bring a copy of your health care power of attorney and living will to the office to be added to your chart at your convenience. ? ? ?Conditions/risks identified: KEEP UP THE GOOD WORK!! ? ?Next appointment: Follow up in one year for your annual wellness visit. 05/23/2022 @ 1:30 PM. ? ?Preventive Care 32 Years and Older, Male ? ?Preventive care refers to lifestyle choices and visits with your health care provider that can promote health and wellness. ?What does preventive care include? ?A yearly physical exam. This is also called an annual well check. ?Dental exams once or twice a year. ?Routine eye exams. Ask your health care provider how often you should have your eyes checked. ?Personal lifestyle choices, including: ?Daily care of your teeth and gums. ?Regular physical activity. ?Eating a healthy diet. ?Avoiding tobacco and drug use. ?Limiting alcohol use. ?Practicing safe sex. ?Taking low doses of aspirin every day. ?Taking vitamin and mineral supplements as recommended by your health care provider. ?What happens during an annual well check? ?The services and screenings done by your health care provider during your annual well check will depend on your age, overall health, lifestyle risk  factors, and family history of disease. ?Counseling  ?Your health care provider may ask you questions about your: ?Alcohol use. ?Tobacco use. ?Drug use. ?Emotional well-being. ?Home and relationship well-being. ?Sexual activity. ?Eating habits. ?History of falls. ?Memory and ability to understand (cognition). ?Work and work Statistician. ?Screening  ?You may have the following tests or measurements: ?Height, weight, and BMI. ?Blood pressure. ?Lipid and cholesterol levels. These may be checked every 5 years, or more frequently if you are over 15 years old. ?Skin check. ?Lung cancer screening. You may have this screening every year starting at age 38 if you have a 30-pack-year history of smoking and currently smoke or have quit within the past 15 years. ?Fecal occult blood test (FOBT) of the stool. You may have this test every year starting at age 49. ?Flexible sigmoidoscopy or colonoscopy. You may have a sigmoidoscopy every 5 years or a colonoscopy every 10 years starting at age 13. ?Prostate cancer screening. Recommendations will vary depending on your family history and other risks. ?Hepatitis C blood test. ?Hepatitis B blood test. ?Sexually transmitted disease (STD) testing. ?Diabetes screening. This is done by checking your blood sugar (glucose) after you have not eaten for a while (fasting). You may have this done every 1-3 years. ?Abdominal aortic aneurysm (AAA) screening. You may need this if you are a current or former smoker. ?Osteoporosis. You may be screened starting at age 22 if you are at high risk. ?Talk with your health care provider about your test results, treatment options, and if necessary, the need for more tests. ?Vaccines  ?Your health care provider may recommend certain vaccines, such as: ?Influenza vaccine. This is recommended every year. ?Tetanus, diphtheria, and acellular pertussis (  Tdap, Td) vaccine. You may need a Td booster every 10 years. ?Zoster vaccine. You may need this after age  54. ?Pneumococcal 13-valent conjugate (PCV13) vaccine. One dose is recommended after age 53. ?Pneumococcal polysaccharide (PPSV23) vaccine. One dose is recommended after age 26. ?Talk to your health care provider about which screenings and vaccines you need and how often you need them. ?This information is not intended to replace advice given to you by your health care provider. Make sure you discuss any questions you have with your health care provider. ?Document Released: 01/30/2015 Document Revised: 09/23/2015 Document Reviewed: 11/04/2014 ?Elsevier Interactive Patient Education ? 2017 [employer]. ? ?Fall Prevention in the Home ?Falls can cause injuries. They can happen to people of all ages. There are many things you can do to make your home safe and to help prevent falls. ?What can I do on the outside of my home? ?Regularly fix the edges of walkways and driveways and fix any cracks. ?Remove anything that might make you trip as you walk through a door, such as a raised step or threshold. ?Trim any bushes or trees on the path to your home. ?Use bright outdoor lighting. ?Clear any walking paths of anything that might make someone trip, such as rocks or tools. ?Regularly check to see if handrails are loose or broken. Make sure that both sides of any steps have handrails. ?Any raised decks and porches should have guardrails on the edges. ?Have any leaves, snow, or ice cleared regularly. ?Use sand or salt on walking paths during winter. ?Clean up any spills in your garage right away. This includes oil or grease spills. ?What can I do in the bathroom? ?Use night lights. ?Install grab bars by the toilet and in the tub and shower. Do not use towel bars as grab bars. ?Use non-skid mats or decals in the tub or shower. ?If you need to sit down in the shower, use a plastic, non-slip stool. ?Keep the floor dry. Clean up any water that spills on the floor as soon as it happens. ?Remove soap buildup in the tub or shower  regularly. ?Attach bath mats securely with double-sided non-slip rug tape. ?Do not have throw rugs and other things on the floor that can make you trip. ?What can I do in the bedroom? ?Use night lights. ?Make sure that you have a light by your bed that is easy to reach. ?Do not use any sheets or blankets that are too big for your bed. They should not hang down onto the floor. ?Have a firm chair that has side arms. You can use this for support while you get dressed. ?Do not have throw rugs and other things on the floor that can make you trip. ?What can I do in the kitchen? ?Clean up any spills right away. ?Avoid walking on wet floors. ?Keep items that you use a lot in easy-to-reach places. ?If you need to reach something above you, use a strong step stool that has a grab bar. ?Keep electrical cords out of the way. ?Do not use floor polish or wax that makes floors slippery. If you must use wax, use non-skid floor wax. ?Do not have throw rugs and other things on the floor that can make you trip. ?What can I do with my stairs? ?Do not leave any items on the stairs. ?Make sure that there are handrails on both sides of the stairs and use them. Fix handrails that are broken or loose. Make sure that handrails are  as long as the stairways. ?Check any carpeting to make sure that it is firmly attached to the stairs. Fix any carpet that is loose or worn. ?Avoid having throw rugs at the top or bottom of the stairs. If you do have throw rugs, attach them to the floor with carpet tape. ?Make sure that you have a light switch at the top of the stairs and the bottom of the stairs. If you do not have them, ask someone to add them for you. ?What else can I do to help prevent falls? ?Wear shoes that: ?Do not have high heels. ?Have rubber bottoms. ?Are comfortable and fit you well. ?Are closed at the toe. Do not wear sandals. ?If you use a stepladder: ?Make sure that it is fully opened. Do not climb a closed stepladder. ?Make sure that  both sides of the stepladder are locked into place. ?Ask someone to hold it for you, if possible. ?Clearly mark and make sure that you can see: ?Any grab bars or handrails. ?First and last steps. ?Where the edge of e

## 2021-05-14 NOTE — Progress Notes (Addendum)
? ?Subjective:  ? Victor Osborne is a 88 y.o. male who presents for Medicare Annual/Subsequent preventive examination. ?Virtual Visit via Telephone Note ? ?I connected with  Victor Osborne on 05/14/21 at  2:00 PM EDT by telephone and verified that I am speaking with the correct person using two identifiers. ? ?Location: ?Patient: HOME ?Provider: [facility] ?Persons participating in the virtual visit: patient/Nurse Health Advisor ?  ?I discussed the limitations, risks, security and privacy concerns of performing an evaluation and management service by telephone and the availability of in person appointments. The patient expressed understanding and agreed to proceed. ? ?Interactive audio and video telecommunications were attempted between this nurse and patient, however failed, due to patient having technical difficulties OR patient did not have access to video capability.  We continued and completed visit with audio only. ? ?Some vital signs may be absent or patient reported.  ? ?Victor Driver, LPN ? ?Review of Systems    ? ?Cardiac Risk Factors include: advanced age (>86mn, >>24women);hypertension;male gender;sedentary lifestyle;Other (see comment), Risk factor comments: COPD, PARKINSONS. ? ?   ?Objective:  ?  ?Today's Vitals  ? 05/14/21 1405 05/14/21 1406  ?Weight: 201 lb (91.2 kg)   ?Height: '6\' 4"'$  (1.93 m)   ?PainSc:  7   ? ?Body mass index is 24.47 kg/m?. ? ? ?  05/14/2021  ?  2:11 PM 04/13/2021  ?  1:15 PM 04/12/2021  ?  6:23 PM 03/30/2021  ?  8:00 PM 03/30/2021  ? 12:36 PM 10/26/2020  ?  1:56 PM 10/14/2020  ? 10:27 AM  ?Advanced Directives  ?Does Patient Have a Medical Advance Directive? Yes Yes Yes Yes Yes Yes Yes  ?Type of [place]amed[place] will Living will Healthcare Power of [place]aylor LandingLiving will   ?Does patient want to make changes to medical advance directive?   No - Patient declined No - Patient declined   No - Patient declined   ?Copy of [place] Chart? No - copy requested  No - copy requested No - copy requested  No - copy requested   ? ? ?Current Medications (verified) ?Outpatient Encounter Medications as of 05/14/2021  ?Medication Sig  ? acetaminophen (TYLENOL) 325 MG tablet Take 650 mg by mouth every 6 (six) hours as needed.  ? alclomethasone (ACLOVATE) 0.05 % ointment Apply 1 application topically 2 (two) times daily as needed (for rash).  ? aspirin 325 MG EC tablet Take 1 tablet (325 mg total) by mouth daily.  ? Bacillus Coagulans-Inulin (PROBIOTIC-PREBIOTIC) 1-250 BILLION-MG CAPS Take 1 capsule by mouth daily.  ? Bee Pollen 550 MG CAPS Take 550 mg by mouth daily.  ? carbamazepine (TEGRETOL) 200 MG tablet Take 1 tablet (200 mg total) by mouth 2 (two) times daily.  ? carbidopa-levodopa (SINEMET IR) 25-100 MG tablet Take 2 tablets by mouth 3 (three) times daily.  ? cholecalciferol (VITAMIN D) 1000 UNITS tablet Take 2,000 Units by mouth daily.  ? clopidogrel (PLAVIX) 75 MG tablet Take 1 tablet (75 mg total) by mouth daily.  ? Coenzyme Q10 (CO Q 10 PO) Take 1 capsule by mouth daily.  ? desoximetasone (TOPICORT) 0.25 % cream Apply 1 application topically 2 (two) times daily.  ? ferrous sulfate 324 (65 Fe) MG TBEC Take 324 mg by mouth.  ? Flaxseed, Linseed, (FLAX SEED OIL) 1300 MG CAPS Take 1 capsule by mouth daily.  ? FLUoxetine (PROZAC) 10 MG  capsule Take 1 capsule (10 mg total) by mouth daily.  ? levothyroxine (LEVOTHROID) 125 MCG tablet Take 1 tablet (125 mcg total) by mouth daily before breakfast.  ? MAGNESIUM PO Take 1 capsule by mouth daily.  ? Multiple Vitamin (MULITIVITAMIN WITH MINERALS) TABS Take 1 tablet by mouth daily.  ? Omega-3 Fatty Acids (OMEGA-3 CF PO) Take 1,200 mg by mouth daily.  ? POTASSIUM GLUCONATE PO Take 1 capsule by mouth daily.  ? pravastatin (PRAVACHOL) 80 MG tablet Take 1 tablet (80 mg total) by mouth daily.  ? QUEtiapine (SEROQUEL XR) 50 MG TB24 24 hr tablet Take  1 tablet (50 mg total) by mouth at bedtime.  ? tamsulosin (FLOMAX) 0.4 MG CAPS capsule Take 0.4 mg by mouth at bedtime.  ? TURMERIC CURCUMIN PO Take 1 capsule by mouth daily.  ? ?No facility-administered encounter medications on file as of 05/14/2021.  ? ? ?Allergies (verified) ?Patient has no known allergies.  ? ?History: ?Past Medical History:  ?Diagnosis Date  ? Bipolar 1 disorder ([place])   ? COPD (chronic obstructive pulmonary disease) ([place])   ? Depression   ? First degree AV block   ? Hypertension   ? pt denies but was in medical record  ? Hypoglycemia, unspecified   ? Myalgia and myositis, unspecified   ? Obesity   ? Pneumonia 1970  ? Skin cancer (melanoma) ([place])   ? Thyroid disease   ? Unspecified hypothyroidism   ? UPJ obstruction, congenital   ? ?Past Surgical History:  ?Procedure Laterality Date  ? APPENDECTOMY  1939  ? malignant melanoma removed from right forehead  2008  ? PERMANENT PACEMAKER INSERTION N/A 06/19/2013  ? Procedure: PERMANENT PACEMAKER INSERTION;  Surgeon: Evans Lance, MD;  Location: [facility] CATH LAB;  Service: Cardiovascular;  Laterality: N/A;  ? right big toe  surgery  2009  ? TRANSURETHRAL RESECTION OF PROSTATE  02/04/2011  ? Procedure: TRANSURETHRAL RESECTION OF THE PROSTATE (TURP);  Surgeon: Dutch Gray, MD;  Location: [facility] ORS;  Service: Urology;  Laterality: N/A;  ? URETHRA SURGERY  2010  ? reconstruction  ? ?Family History  ?Problem Relation Age of Onset  ? Cerebral aneurysm Mother   ? Lung cancer Father   ? Drug abuse Daughter   ? ?Social History  ? ?Socioeconomic History  ? Marital status: Widowed  ?  Spouse name: Not on file  ? Number of children: Not on file  ? Years of education: Not on file  ? Highest education level: Not on file  ?Occupational History  ? Occupation: retired  ?Tobacco Use  ? Smoking status: Former  ?  Packs/day: 2.00  ?  Years: 40.00  ?  Pack years: 80.00  ?  Types: Cigarettes  ? Smokeless tobacco: Never  ?Vaping Use  ? Vaping Use: Never used  ?Substance and Sexual  Activity  ? Alcohol use: No  ? Drug use: No  ? Sexual activity: Not Currently  ?Other Topics Concern  ? Not on file  ?Social History Narrative  ? Work or School: retire Engineer, structural  ?   ? Home Situation: lives with wife  ?   ? Spiritual Beliefs: episcopalian  ?   ? Lifestyle: active with yard work; diet ok  ? ?Social Determinants of Health  ? ?Financial Resource Strain: Low Risk   ? Difficulty of Paying Living Expenses: Not hard at all  ?Food Insecurity: No Food Insecurity  ? Worried About Charity fundraiser in the Last Year: Never true  ?  Ran Out of Food in the Last Year: Never true  ?Transportation Needs: No Transportation Needs  ? Lack of Transportation (Medical): No  ? Lack of Transportation (Non-Medical): No  ?Physical Activity: Sufficiently Active  ? Days of Exercise per Week: 5 days  ? Minutes of Exercise per Session: 30 min  ?Stress: No Stress Concern Present  ? Feeling of Stress : Not at all  ?Social Connections: Moderately Integrated  ? Frequency of Communication with Friends and Family: More than three times a week  ? Frequency of Social Gatherings with Friends and Family: More than three times a week  ? Attends Religious Services: More than 4 times per year  ? Active Member of Clubs or Organizations: Yes  ? Attends Archivist Meetings: More than 4 times per year  ? Marital Status: Widowed  ? ? ?Tobacco Counseling ?Counseling given: Not Answered ? ? ?Clinical Intake: ? ?Pre-visit preparation completed: Yes ? ?Pain : 0-10 ?Pain Score: 7  ?Pain Type: Chronic pain ?Pain Location: Wrist ?Pain Descriptors / Indicators: Aching, Dull ?Pain Onset: More than a month ago ?Pain Frequency: Intermittent ? ?  ? ?BMI - recorded: 24.47 ?Nutritional Status: BMI of 19-24  Normal ?Nutritional Risks: None ?Diabetes: No ? ?How often do you need to have someone help you when you read instructions, pamphlets, or other written materials from your doctor or pharmacy?: 1 - Never ? ?Diabetic?Pt states he is not  diabetic. ? ?Interpreter Needed?: No ? ?Information entered by :: mj Corona Popovich, lpn ? ? ?Activities of Daily Living ? ?  05/14/2021  ?  2:11 PM 03/30/2021  ?  8:00 PM  ?In your present state of health, do you have

## 2021-05-19 ENCOUNTER — Other Ambulatory Visit: Payer: Self-pay | Admitting: Psychiatry

## 2021-05-19 DIAGNOSIS — F3175 Bipolar disorder, in partial remission, most recent episode depressed: Secondary | ICD-10-CM

## 2021-05-19 NOTE — Chronic Care Management (AMB) (Signed)
?  Care Management  ? ?Note ? ?05/19/2021 ?Name: Victor Osborne MRN: [id number] DOB: 1932/12/07 ? ?[name] is a 88 y.o. year old male who is a primary care patient of [name], [place]. I reached out to [name] by phone today offer care coordination services.  ? ?[name] was given information about care management services today including:  ?Care management services include personalized support from designated clinical staff supervised by his physician, including individualized plan of care and coordination with other care providers ?24/7 contact phone numbers for assistance for urgent and routine care needs. ?The patient may stop care management services at any time by phone call to the office staff. ? ?Patient did not agree to enrollment in care management services and does not wish to consider at this time. ? ?Follow up plan: ?Patient declines further follow up and engagement by the care management team. Appropriate care team members and provider have been notified via electronic communication.  ? ?Amzie Sillas, CCMA ?Care Guide, Embedded Care Coordination ?[place]  Care Management  ?Direct Dial: [phone] ? ? ?

## 2021-06-02 ENCOUNTER — Ambulatory Visit: Payer: Medicare Other | Admitting: Psychiatry

## 2021-06-09 ENCOUNTER — Encounter: Payer: Self-pay | Admitting: Psychiatry

## 2021-06-09 ENCOUNTER — Ambulatory Visit (INDEPENDENT_AMBULATORY_CARE_PROVIDER_SITE_OTHER): Payer: Medicare Other | Admitting: Psychiatry

## 2021-06-09 ENCOUNTER — Other Ambulatory Visit: Payer: Self-pay | Admitting: Psychiatry

## 2021-06-09 DIAGNOSIS — F3175 Bipolar disorder, in partial remission, most recent episode depressed: Secondary | ICD-10-CM

## 2021-06-09 DIAGNOSIS — F4312 Post-traumatic stress disorder, chronic: Secondary | ICD-10-CM

## 2021-06-09 MED ORDER — FLUOXETINE HCL 10 MG PO CAPS: 10.0000 mg | ORAL_CAPSULE | Freq: Every day | ORAL | 1 refills | Status: DC

## 2021-06-09 MED ORDER — CARBAMAZEPINE 200 MG PO TABS: 200.0000 mg | ORAL_TABLET | Freq: Three times a day (TID) | ORAL | 0 refills | Status: DC

## 2021-06-09 NOTE — Progress Notes (Signed)
Victor Osborne [id number] [birth year] 88 y.o.  Subjective:   Patient ID:  Victor Osborne is a 88 y.o. (DOB June 30, 1932) male.  Chief Complaint:  Chief Complaint  Patient presents with   Manic Behavior    HPI Victor Osborne presents to the office today for follow-up of Bipolar Disorder and anxiety. He reports that he may have had a mini-stroke about 2 months ago and has had difficulty with his balance since then. He was in hospitalized for a week. He has been in PT.   He reports, "i'm a little hyper right now." He reports that he has been "working my program" to maintain his mood. He reports some mood lability and has been trying to manage mood changes. He reports that he has had more elevated moods. Denies impulsive or risky behavior by his standards. He has been staying up a little later "but my sleep itself is pretty solid." He reports that he tries to sleep 8 hours a night. He reports that his energy and motivation have been high. Some increased goal-directed activity and made a dresser. Appetite has been good and tries to eat protein. Concentration is adequate. Denies SI.   He reports that he is sensitive to rejection. Denies severe anxiety.   Went to the park all day yesterday with his dog and enjoyed this.   Past Psychiatric Medication Trials: Carbamazepine Lithium Prozac Remeron Seroquel XR Ambien    PHQ2-9    Flowsheet Row Clinical Support from 05/14/2021 in [facility] at [place] Visit from 05/07/2021 in [facility] Patient Outreach Telephone from 04/12/2021 in [place] [facility] Visit from 01/12/2021 in [place] at [place] Visit from 10/31/2017 in [place] at [place]  PHQ-2 Total Score 0 0 0 0 3  PHQ-9 Total Score -- -- -- -- 9      Flowsheet Row ED from 05/11/2021 in [place] ED to Hosp-Admission (Discharged) from 03/30/2021 in [place] [facility]  C-SSRS RISK CATEGORY No Risk No Risk        Review of Systems:  Review of Systems  Musculoskeletal:  Positive for gait problem.       Ambulating with rolator  Neurological:  Positive for tremors and weakness.  Psychiatric/Behavioral:         Please refer to HPI   Medications: I have reviewed the patient's current medications.  Current Outpatient Medications  Medication Sig Dispense Refill   aspirin 325 MG EC tablet Take 1 tablet (325 mg total) by mouth daily. 90 tablet 3   clopidogrel (PLAVIX) 75 MG tablet Take 1 tablet (75 mg total) by mouth daily. 30 tablet 0   pravastatin (PRAVACHOL) 80 MG tablet Take 1 tablet (80 mg total) by mouth daily. 90 tablet 1   acetaminophen (TYLENOL) 325 MG tablet Take 650 mg by mouth every 6 (six) hours as needed.     alclomethasone (ACLOVATE) 0.05 % ointment Apply 1 application topically 2 (two) times daily as needed (for rash).     Bacillus Coagulans-Inulin (PROBIOTIC-PREBIOTIC) 1-250 BILLION-MG CAPS Take 1 capsule by mouth daily.     Bee Pollen 550 MG CAPS Take 550 mg by mouth daily.     carbamazepine (TEGRETOL) 200 MG tablet Take 1 tablet (200 mg total) by mouth 3 (three) times daily. 270 tablet 0   carbidopa-levodopa (SINEMET IR) 25-100 MG tablet Take 2 tablets  by mouth 3 (three) times daily. 540 tablet 1   cholecalciferol (VITAMIN D) 1000 UNITS tablet Take 2,000 Units by mouth daily.     Coenzyme Q10 (CO Q 10 PO) Take 1 capsule by mouth daily.     desoximetasone (TOPICORT) 0.25 % cream Apply 1 application topically 2 (two) times daily.     ferrous sulfate 324 (65 Fe) MG TBEC Take 324 mg by mouth.     Flaxseed, Linseed, (FLAX SEED OIL) 1300 MG CAPS Take 1 capsule by mouth daily.     FLUoxetine (PROZAC) 10 MG capsule Take 1 capsule (10 mg total) by mouth daily. 90 capsule 1   levothyroxine (LEVOTHROID) 125 MCG tablet Take 1 tablet (125 mcg  total) by mouth daily before breakfast. 90 tablet 3   MAGNESIUM PO Take 1 capsule by mouth daily.     Multiple Vitamin (MULITIVITAMIN WITH MINERALS) TABS Take 1 tablet by mouth daily.     Omega-3 Fatty Acids (OMEGA-3 CF PO) Take 1,200 mg by mouth daily.     POTASSIUM GLUCONATE PO Take 1 capsule by mouth daily.     QUEtiapine (SEROQUEL XR) 50 MG TB24 24 hr tablet TAKE 1 TABLET AT BEDTIME 90 tablet 1   tamsulosin (FLOMAX) 0.4 MG CAPS capsule Take 0.4 mg by mouth at bedtime.     TURMERIC CURCUMIN PO Take 1 capsule by mouth daily.     No current facility-administered medications for this visit.    Medication Side Effects: None  Allergies: No Known Allergies  Past Medical History:  Diagnosis Date   Bipolar 1 disorder (HCC)    COPD (chronic obstructive pulmonary disease) (HCC)    Depression    First degree AV block    Hypertension    pt denies but was in medical record   Hypoglycemia, unspecified    Myalgia and myositis, unspecified    Obesity    Pneumonia 1970   Skin cancer (melanoma) ([place])    Thyroid disease    Unspecified hypothyroidism    UPJ obstruction, congenital     Past Medical History, Surgical history, Social history, and Family history were reviewed and updated as appropriate.   Please see review of systems for further details on the patient's review from today.   Objective:   Physical Exam:  There were no vitals taken for this visit.  Physical Exam Constitutional:      General: He is not in acute distress. Musculoskeletal:        General: No deformity.  Neurological:     Mental Status: He is alert and oriented to person, place, and time.     Coordination: Coordination normal.  Psychiatric:        Attention and Perception: Attention and perception normal. He does not perceive auditory or visual hallucinations.        Mood and Affect: Mood is not anxious or depressed. Affect is not labile, blunt, angry or inappropriate.        Speech: Speech normal.         Behavior: Behavior normal.        Thought Content: Thought content normal. Thought content is not paranoid or delusional. Thought content does not include homicidal or suicidal ideation. Thought content does not include homicidal or suicidal plan.        Cognition and Memory: Cognition and memory normal.        Judgment: Judgment normal.     Comments: Insight intact Mood is slightly elevated    Lab Review:  Component Value Date/Time   NA 140 03/30/2021 1255   NA 143 01/16/2019 0838   K 4.4 03/30/2021 1255   CL 107 03/30/2021 1255   CO2 25 03/30/2021 1255   GLUCOSE 108 (H) 03/30/2021 1255   BUN 25 (H) 03/30/2021 1255   BUN 15 01/16/2019 0838   CREATININE 1.30 (H) 03/30/2021 1255   CREATININE 1.18 08/14/2020 1112   CALCIUM 9.1 03/30/2021 1255   PROT 6.4 01/12/2021 1000   PROT 6.2 01/16/2019 0838   ALBUMIN 4.1 01/12/2021 1000   ALBUMIN 4.3 01/16/2019 0838   AST 16 01/12/2021 1000   ALT 4 01/12/2021 1000   ALKPHOS 62 01/12/2021 1000   BILITOT 0.6 01/12/2021 1000   BILITOT 0.3 01/16/2019 0838   GFRNONAA 54 (L) 03/30/2021 1255   GFRAA 61 01/16/2019 0838       Component Value Date/Time   WBC 8.2 03/30/2021 1350   RBC 4.44 03/30/2021 1350   HGB 14.5 03/30/2021 1350   HGB 13.4 01/16/2019 0838   HCT 44.0 03/30/2021 1350   HCT 41.1 01/16/2019 0838   PLT 178 03/30/2021 1350   PLT 188 01/16/2019 0838   MCV 99.1 03/30/2021 1350   MCV 97 01/16/2019 0838   MCH 32.7 03/30/2021 1350   MCHC 33.0 03/30/2021 1350   RDW 13.5 03/30/2021 1350   RDW 12.9 01/16/2019 0838   LYMPHSABS 0.5 (L) 03/30/2021 1350   LYMPHSABS 0.6 (L) 01/16/2019 0838   MONOABS 0.5 03/30/2021 1350   EOSABS 0.0 03/30/2021 1350   EOSABS 0.3 01/16/2019 0838   BASOSABS 0.1 03/30/2021 1350   BASOSABS 0.1 01/16/2019 0838    No results found for: POCLITH, LITHIUM   Lab Results  Component Value Date   CBMZ 4.8 08/14/2020     .res Assessment: Plan:    Pt seen for 30 minutes and time spent discussing  recent mild hypomania and mood lability. Agree that increase in Carbamazepine to 200 mg TID would likely be helpful for mood stabilization.  Continue Prozac 10 mg po qd for anxiety.  Continue Seroquel XR 50 mg po at bedtime for mood stabilization and h/o mild paranoia.  Pt to follow-up in 3 months or sooner if clinically indicated.  Patient advised to contact office with any questions, adverse effects, or acute worsening in signs and symptoms.   Victor Osborne was seen today for manic behavior.  Diagnoses and all orders for this visit:  Bipolar I disorder, current or most recent episode depressed, in partial remission (HCC) -     carbamazepine (TEGRETOL) 200 MG tablet; Take 1 tablet (200 mg total) by mouth 3 (three) times daily. -     FLUoxetine (PROZAC) 10 MG capsule; Take 1 capsule (10 mg total) by mouth daily.  Chronic post-traumatic stress disorder -     FLUoxetine (PROZAC) 10 MG capsule; Take 1 capsule (10 mg total) by mouth daily.     Please see After Visit Summary for patient specific instructions.  Future Appointments  Date Time Provider [place]  06/15/2021  1:25 PM [facility] DEVICE REMOTES [facility]  08/12/2021  9:30 AM Percival Spanish, PT [facility]  09/09/2021 10:00 AM Thayer Headings, PMHNP CP-CP None  09/14/2021  1:25 PM [facility] DEVICE REMOTES [facility]  09/28/2021  9:30 AM Baird Lyons D, MD [facility] None  10/05/2021  3:00 PM Tat, Eustace Quail, DO [facility] None  11/05/2021  1:00 PM [name], FNP [facility]  12/14/2021  1:25 PM [facility] DEVICE REMOTES [facility]  05/23/2022  1:30  PM [facility] HEALTH COACH 2 [facility]    No orders of the defined types were placed in this encounter.   -------------------------------

## 2021-06-15 ENCOUNTER — Ambulatory Visit (INDEPENDENT_AMBULATORY_CARE_PROVIDER_SITE_OTHER): Payer: Medicare Other

## 2021-06-15 DIAGNOSIS — I495 Sick sinus syndrome: Secondary | ICD-10-CM | POA: Diagnosis not present

## 2021-06-15 LAB — CUP PACEART REMOTE DEVICE CHECK
Battery Remaining Longevity: 54 mo
Battery Remaining Percentage: 57 %
Brady Statistic RA Percent Paced: 81 %
Brady Statistic RV Percent Paced: 61 %
Date Time Interrogation Session: 20230530002100
Implantable Lead Implant Date: 20150603
Implantable Lead Implant Date: 20150603
Implantable Lead Location: 753859
Implantable Lead Location: 753860
Implantable Lead Model: 4136
Implantable Lead Model: 4137
Implantable Lead Serial Number: 29477746
Implantable Lead Serial Number: 29617326
Implantable Pulse Generator Implant Date: 20150603
Lead Channel Impedance Value: 541 Ohm
Lead Channel Impedance Value: 744 Ohm
Lead Channel Pacing Threshold Amplitude: 0.6 V
Lead Channel Pacing Threshold Amplitude: 1.9 V
Lead Channel Pacing Threshold Pulse Width: 0.4 ms
Lead Channel Pacing Threshold Pulse Width: 0.5 ms
Lead Channel Setting Pacing Amplitude: 1.8 V
Lead Channel Setting Pacing Amplitude: 2 V
Lead Channel Setting Pacing Pulse Width: 0.4 ms
Lead Channel Setting Sensing Sensitivity: 2.5 mV
Pulse Gen Serial Number: 389736

## 2021-07-01 NOTE — Progress Notes (Signed)
Remote pacemaker transmission.   

## 2021-08-12 ENCOUNTER — Ambulatory Visit: Payer: Medicare Other | Admitting: Physical Therapy

## 2021-09-09 ENCOUNTER — Ambulatory Visit: Payer: Medicare Other | Admitting: Psychiatry

## 2021-09-14 ENCOUNTER — Ambulatory Visit (INDEPENDENT_AMBULATORY_CARE_PROVIDER_SITE_OTHER): Payer: Medicare Other

## 2021-09-14 DIAGNOSIS — I495 Sick sinus syndrome: Secondary | ICD-10-CM

## 2021-09-14 LAB — CUP PACEART REMOTE DEVICE CHECK
Battery Remaining Longevity: 48 mo
Battery Remaining Percentage: 52 %
Brady Statistic RA Percent Paced: 84 %
Brady Statistic RV Percent Paced: 63 %
Date Time Interrogation Session: 20230829002200
Implantable Lead Implant Date: 20150603
Implantable Lead Implant Date: 20150603
Implantable Lead Location: 753859
Implantable Lead Location: 753860
Implantable Lead Model: 4136
Implantable Lead Model: 4137
Implantable Lead Serial Number: 29477746
Implantable Lead Serial Number: 29617326
Implantable Pulse Generator Implant Date: 20150603
Lead Channel Impedance Value: 534 Ohm
Lead Channel Impedance Value: 741 Ohm
Lead Channel Pacing Threshold Amplitude: 0.6 V
Lead Channel Pacing Threshold Amplitude: 1.8 V
Lead Channel Pacing Threshold Pulse Width: 0.4 ms
Lead Channel Pacing Threshold Pulse Width: 0.5 ms
Lead Channel Setting Pacing Amplitude: 2 V
Lead Channel Setting Pacing Amplitude: 2 V
Lead Channel Setting Pacing Pulse Width: 0.4 ms
Lead Channel Setting Sensing Sensitivity: 2.5 mV
Pulse Gen Serial Number: 389736

## 2021-09-16 ENCOUNTER — Encounter: Payer: Self-pay | Admitting: Psychiatry

## 2021-09-16 ENCOUNTER — Ambulatory Visit (INDEPENDENT_AMBULATORY_CARE_PROVIDER_SITE_OTHER): Payer: Medicare Other | Admitting: Psychiatry

## 2021-09-16 DIAGNOSIS — F4312 Post-traumatic stress disorder, chronic: Secondary | ICD-10-CM | POA: Diagnosis not present

## 2021-09-16 DIAGNOSIS — F3175 Bipolar disorder, in partial remission, most recent episode depressed: Secondary | ICD-10-CM | POA: Diagnosis not present

## 2021-09-16 MED ORDER — QUETIAPINE FUMARATE ER 50 MG PO TB24: 50.0000 mg | ORAL_TABLET | Freq: Every day | ORAL | 1 refills | Status: DC

## 2021-09-16 MED ORDER — FLUOXETINE HCL 10 MG PO CAPS: 10.0000 mg | ORAL_CAPSULE | Freq: Every day | ORAL | 1 refills | Status: DC

## 2021-09-16 MED ORDER — CARBAMAZEPINE 200 MG PO TABS: 200.0000 mg | ORAL_TABLET | Freq: Three times a day (TID) | ORAL | 1 refills | Status: DC

## 2021-09-16 NOTE — Progress Notes (Signed)
[name] [id number] [birth year] 88 y.o.  Subjective:   Patient ID:  Victor Osborne is a 88 y.o. (DOB 05-Aug-1932) male.  Chief Complaint:  Chief Complaint  Patient presents with   Follow-up    Bipolar D/O, anxiety    HPI Victor Osborne presents to the office today for follow-up of Bipolar Disorder and anxiety.   "I'm really happy" and that increase in Carbamazepine was helpful- "I didn't get as hyper as often" and noticed less talkativeness. He reports improved anxiety with increase in Carbamazepine. Denies any impulsivity or risky behavior that is not consistent with his baseline. He denies any recent manic s/s. He reports brief periods of sadness that lasts only a few hours. "If I feel a little paranoia, it seems to be associated with depression." He reports that he does not act upon this and that it resolves within a few hours. He will then avoid situations and people that he perceives are negative for him. He will exit an environment or conversation when he experiences paranoia to avoid damaging relationships or making statements he may regret. Sleeping 8 hours a night. He has been eating 3 meals daily and appetite has been good. He has some occ intrusive memories about past traumatic events. He reports that he will redirect negative thoughts when these occur. He will also help others to help with anxiety. He reports that his energy has been good. Motivation has been good and is staying as physically and mentally active as possible. Concentration is adequate. Denies SI.   Past Psychiatric Medication Trials: Carbamazepine Lithium Prozac Remeron Seroquel XR Ambien  AIMS    [place] Office Visit from 09/16/2021 in [place] Total Score 1      PHQ2-9    Flowsheet Row Clinical Support from 05/14/2021 in [facility] [place] Visit from 05/07/2021 in [place] [facility] Patient Outreach Telephone from 04/12/2021 in [place] Office Visit from 01/12/2021 in [place] at [place] Visit from 10/31/2017 in [place] at [place]  PHQ-2 Total Score 0 0 0 0 3  PHQ-9 Total Score -- -- -- -- 9      Flowsheet Row ED from 05/11/2021 in [place] ED to Hosp-Admission (Discharged) from 03/30/2021 in [place] [facility]  C-SSRS RISK CATEGORY No Risk No Risk        Review of Systems:  Review of Systems  Musculoskeletal:  Positive for gait problem.  Neurological:  Positive for tremors.  Psychiatric/Behavioral:         Please refer to HPI    Medications: I have reviewed the patient's current medications.  Current Outpatient Medications  Medication Sig Dispense Refill   acetaminophen (TYLENOL) 325 MG tablet Take 650 mg by mouth every 6 (six) hours as needed.     alclomethasone (ACLOVATE) 0.05 % ointment Apply 1 application topically 2 (two) times daily as needed (for rash).     aspirin 325 MG EC tablet Take 1 tablet (325 mg total) by mouth daily. 90 tablet 3   Bacillus Coagulans-Inulin (PROBIOTIC-PREBIOTIC) 1-250 BILLION-MG CAPS Take 1 capsule by mouth daily.     Bee Pollen 550 MG CAPS Take 550 mg by mouth daily.     carbidopa-levodopa (SINEMET IR) 25-100 MG tablet Take 2 tablets by mouth 3 (three) times daily. 540 tablet 1   cholecalciferol (VITAMIN D) 1000  UNITS tablet Take 2,000 Units by mouth daily.     Coenzyme Q10 (CO Q 10 PO) Take 1 capsule by mouth daily.     ferrous sulfate 324 (65 Fe) MG TBEC Take 324 mg by mouth.     Flaxseed, Linseed, (FLAX SEED OIL) 1300 MG CAPS Take 1 capsule by mouth daily.     levothyroxine (LEVOTHROID) 125 MCG tablet Take 1 tablet (125 mcg total) by mouth daily before breakfast. 90 tablet 3   MAGNESIUM PO Take 1 capsule by mouth daily.     Multiple Vitamin (MULITIVITAMIN WITH MINERALS) TABS  Take 1 tablet by mouth daily.     Omega-3 Fatty Acids (OMEGA-3 CF PO) Take 1,200 mg by mouth daily.     POTASSIUM GLUCONATE PO Take 1 capsule by mouth daily.     pravastatin (PRAVACHOL) 80 MG tablet Take 1 tablet (80 mg total) by mouth daily. 90 tablet 1   tamsulosin (FLOMAX) 0.4 MG CAPS capsule Take 0.4 mg by mouth at bedtime.     TURMERIC CURCUMIN PO Take 1 capsule by mouth daily.     carbamazepine (TEGRETOL) 200 MG tablet Take 1 tablet (200 mg total) by mouth 3 (three) times daily. 270 tablet 1   desoximetasone (TOPICORT) 0.25 % cream Apply 1 application topically 2 (two) times daily.     FLUoxetine (PROZAC) 10 MG capsule Take 1 capsule (10 mg total) by mouth daily. 90 capsule 1   QUEtiapine (SEROQUEL XR) 50 MG TB24 24 hr tablet Take 1 tablet (50 mg total) by mouth at bedtime. 90 tablet 1   No current facility-administered medications for this visit.    Medication Side Effects: None  Allergies: No Known Allergies  Past Medical History:  Diagnosis Date   Bipolar 1 disorder (HCC)    COPD (chronic obstructive pulmonary disease) (HCC)    Depression    First degree AV block    Hypertension    pt denies but was in medical record   Hypoglycemia, unspecified    Myalgia and myositis, unspecified    Obesity    Pneumonia 1970   Skin cancer (melanoma) ([place])    Thyroid disease    Unspecified hypothyroidism    UPJ obstruction, congenital     Past Medical History, Surgical history, Social history, and Family history were reviewed and updated as appropriate.   Please see review of systems for further details on the patient's review from today.   Objective:   Physical Exam:  There were no vitals taken for this visit.  Physical Exam Constitutional:      General: He is not in acute distress. Musculoskeletal:        General: No deformity.  Neurological:     Mental Status: He is alert and oriented to person, place, and time.     Coordination: Coordination normal.  Psychiatric:         Attention and Perception: Attention and perception normal. He does not perceive auditory or visual hallucinations.        Mood and Affect: Mood normal. Mood is not anxious or depressed. Affect is not labile, blunt, angry or inappropriate.        Speech: Speech normal.        Behavior: Behavior normal.        Thought Content: Thought content normal. Thought content is not paranoid or delusional. Thought content does not include homicidal or suicidal ideation. Thought content does not include homicidal or suicidal plan.        Cognition  and Memory: Cognition and memory normal.        Judgment: Judgment normal.     Comments: Insight intact     Lab Review:     Component Value Date/Time   NA 140 03/30/2021 1255   NA 143 01/16/2019 0838   K 4.4 03/30/2021 1255   CL 107 03/30/2021 1255   CO2 25 03/30/2021 1255   GLUCOSE 108 (H) 03/30/2021 1255   BUN 25 (H) 03/30/2021 1255   BUN 15 01/16/2019 0838   CREATININE 1.30 (H) 03/30/2021 1255   CREATININE 1.18 08/14/2020 1112   CALCIUM 9.1 03/30/2021 1255   PROT 6.4 01/12/2021 1000   PROT 6.2 01/16/2019 0838   ALBUMIN 4.1 01/12/2021 1000   ALBUMIN 4.3 01/16/2019 0838   AST 16 01/12/2021 1000   ALT 4 01/12/2021 1000   ALKPHOS 62 01/12/2021 1000   BILITOT 0.6 01/12/2021 1000   BILITOT 0.3 01/16/2019 0838   GFRNONAA 54 (L) 03/30/2021 1255   GFRAA 61 01/16/2019 0838       Component Value Date/Time   WBC 8.2 03/30/2021 1350   RBC 4.44 03/30/2021 1350   HGB 14.5 03/30/2021 1350   HGB 13.4 01/16/2019 0838   HCT 44.0 03/30/2021 1350   HCT 41.1 01/16/2019 0838   PLT 178 03/30/2021 1350   PLT 188 01/16/2019 0838   MCV 99.1 03/30/2021 1350   MCV 97 01/16/2019 0838   MCH 32.7 03/30/2021 1350   MCHC 33.0 03/30/2021 1350   RDW 13.5 03/30/2021 1350   RDW 12.9 01/16/2019 0838   LYMPHSABS 0.5 (L) 03/30/2021 1350   LYMPHSABS 0.6 (L) 01/16/2019 0838   MONOABS 0.5 03/30/2021 1350   EOSABS 0.0 03/30/2021 1350   EOSABS 0.3 01/16/2019 0838    BASOSABS 0.1 03/30/2021 1350   BASOSABS 0.1 01/16/2019 0838    No results found for: "POCLITH", "LITHIUM"   Lab Results  Component Value Date   CBMZ 4.8 08/14/2020     .res Assessment: Plan:   Will continue current plan of care since target signs and symptoms are well controlled without any tolerability issues. Continue Carbamazepine 200 mg TID for mood stabilization.  Continue Prozac 10 mg po qd for mood and anxiety.  Continue Seroquel XR 50 mg at bedtime for anxiety and mood stabilization.  Pt to follow-up in 6 months or sooner if clinically indicated.  Patient advised to contact office with any questions, adverse effects, or acute worsening in signs and symptoms.   Othniel was seen today for follow-up.  Diagnoses and all orders for this visit:  Bipolar I disorder, current or most recent episode depressed, in partial remission (HCC) -     carbamazepine (TEGRETOL) 200 MG tablet; Take 1 tablet (200 mg total) by mouth 3 (three) times daily. -     QUEtiapine (SEROQUEL XR) 50 MG TB24 24 hr tablet; Take 1 tablet (50 mg total) by mouth at bedtime. -     FLUoxetine (PROZAC) 10 MG capsule; Take 1 capsule (10 mg total) by mouth daily.  Chronic post-traumatic stress disorder -     FLUoxetine (PROZAC) 10 MG capsule; Take 1 capsule (10 mg total) by mouth daily.     Please see After Visit Summary for patient specific instructions.  Future Appointments  Date Time Provider [place]  09/28/2021  9:30 AM Baird Lyons D, MD [facility] None  10/05/2021  3:00 PM Tat, Eustace Quail, DO [facility] None  11/05/2021  1:00 PM [name], FNP [facility]  12/14/2021  1:25 PM  [facility] DEVICE REMOTES [facility]  03/17/2022 11:00 AM Thayer Headings, PMHNP CP-CP None  05/23/2022  1:30 PM [facility] HEALTH COACH 2 [facility]    No orders of the defined types were placed in this encounter.   -------------------------------

## 2021-09-26 NOTE — Progress Notes (Unsigned)
HPI M former smoker, followed for COPD, rhinitis, OSA, complicated by BiPolar, HBP, hx melanoma, DM, pacemaker, Parkinsons NPSG 02/29/96- AHI 79/ hr, desaturation to 87%, body weight 250 lbs 6 MWT- 99%, 100%, 99%, 510 meters PFT 07/07/2011-mild obstructive airways disease, small airways, insignificant response to bronchodilator. Normal lung volumes, normal diffusion capacity. FEV1/FVC 0.61  --------------------------------------------------------------------------------------------------------    09/28/20- 88 year old male former smoker (40 pk yrs) followed for COPD, rhinitis, OSA/Quit CPAP, complicated by Bipolar, HBP, history melanoma, DM 2, pacemaker, + Parkinson's, Hypothyroid, Memory Loss, Covid infection Feb 2022,  CPAP auto 5-15/Advanced- no longer using x 2 years Download- N/A Body weight today-204 lbs Covid vax- -----F/u cough-better, no sob Had called reporting chest congestion and cough last week. Had Covid 6 months ago.  Recent illness improved- there was no significant malaise, fever or sputum.  Seeking a PCP in [facility] system and lives in [place]. Stopped bothering with CPAP when helping with moves related to wife's illness. Doesn't miss it and doesn't feel he needs it.  CXR 12/18/18- IMPRESSION: 1. No active cardiopulmonary disease. No evidence of pneumonia or pulmonary edema. 2. Hyperexpanded lungs indicating COPD.  09/28/21- 88 year old male former smoker (40 pk yrs) followed for COPD, rhinitis, OSA/Quit CPAP, complicated by Bipolar, HBP, history melanoma, DM 2, Pacemaker, + Parkinson's, Hypothyroid, Memory Loss, Covid infection Feb 2022, -no inhalers CPAP auto 5-15/Advanced- no longer using x 2 years Download- Quit 3 yrs ago Body weight today-199 lbs Covid vax- ------Pt f/u for COPD, no SOB to report, no coughing. Mr. Onnie Graham is widowed and living in a senior living facility and now feels pretty stable.  He is aware of mild cough productive of small amounts of white phlegm.  This  also is stable and does not bother him.  He denies dyspnea does not feel sick.  "I feel pretty well".  It sounds as if he has been evaluated for some orthostasis. CXR 1V 03/30/21- IMPRESSION: There are no new infiltrates or signs of pulmonary edema.   ROS-see HPI    + = positive Constitutional:   No-   weight loss, night sweats, fevers, chills, fatigue, lassitude. HEENT:   No-  headaches, difficulty swallowing, tooth/dental problems, sore throat,       No-  sneezing, itching, ear ache, nasal congestion, post nasal drip,  CV:  No-   chest pain, orthopnea, PND, swelling in lower extremities, anasarca, dizziness, palpitations Resp: +  shortness of breath with exertion or at rest.              +productive cough,  No non-productive cough,  No- coughing up of blood.              No-   change in color of mucus.  No- wheezing.   Skin: No-   rash or lesions. GI:  No-   heartburn, indigestion, abdominal pain, nausea, vomiting,  GU:  MS:  + joint pain or swelling. Weakness and falls. Neuro-     tremor Psych:  No- change in mood or affect. No depression or anxiety.  No memory loss.  OBJ   exam similar to last visit General- Alert, Oriented, Affect-appropriate, Distress- none acute, looks well, + slender Skin- rash-none, lesions- none, excoriation- none Lymphadenopathy- none Head- atraumatic            Eyes- Gross vision intact, PERRLA, conjunctivae clear secretions,             Ears- +Hearing aids            Nose-  Clear, no-Septal dev, mucus, polyps, erosion, perforation             Throat- Mallampati II , mucosa clear , drainage- none, tonsils- atrophic Neck- flexible , trachea midline, no stridor , thyroid nl, carotid no bruit Chest - symmetrical excursion , unlabored           Heart/CV-  Pulse is RRR occasional skip , no murmur , no gallop  , no rub, nl s1 s2                           - JVD- none , edema- none, stasis changes- none, varices- none           Lung- +minimal crackle, unlabored,  wheeze- none, cough- none , dullness-none,  rub- none           Chest wall- + left pacemaker Abd-  Br/ Gen/ Rectal- Not done, not indicated Extrem- +cane.  Neuro- +resting tremor of hands

## 2021-09-28 ENCOUNTER — Ambulatory Visit (INDEPENDENT_AMBULATORY_CARE_PROVIDER_SITE_OTHER): Payer: Medicare Other | Admitting: Internal Medicine

## 2021-09-28 ENCOUNTER — Encounter: Payer: Self-pay | Admitting: Internal Medicine

## 2021-09-28 DIAGNOSIS — I63421 Cerebral infarction due to embolism of right anterior cerebral artery: Secondary | ICD-10-CM

## 2021-09-28 DIAGNOSIS — J449 Chronic obstructive pulmonary disease, unspecified: Secondary | ICD-10-CM | POA: Diagnosis not present

## 2021-09-28 DIAGNOSIS — G4733 Obstructive sleep apnea (adult) (pediatric): Secondary | ICD-10-CM | POA: Diagnosis not present

## 2021-09-28 NOTE — Assessment & Plan Note (Addendum)
Clinically very mild chronic bronchitis.  He feels stable.  After discussion, we decided to observe for now rather than adding expensive of inhalers.  I really do not think he would notice much difference.  This can be reconsidered as necessary.

## 2021-09-28 NOTE — Assessment & Plan Note (Signed)
He dropped off of CPAP several years ago when his sleeping comfortably.  Keeps weight down.

## 2021-09-28 NOTE — Patient Instructions (Signed)
I'm glad you feel you are doing ok. If the mild cough and phlegm get bothersome please let us know.

## 2021-10-04 NOTE — Progress Notes (Unsigned)
Assessment/Plan:   1.  Parkinsons Disease  -Continue carbidopa/levodopa 25/100, 2 tablets 3 times per day  -discussed PT. I will send referral  2.  History of well-controlled bipolar  -Follows with psychiatry.  On Tegretol 200 mg tid and Seroquel XR, 50 mg at bedtime  3.  History of melanoma  -Follows with Dr. Delman Cheadle  -Knows that Parkinson's slightly increases risk for future melanomas as well.  4.  Possible ACA infarct  -Could not be confirmed because patients pacemaker is not MRI compatible.  -CT did not demonstrate acute infarct.  -Patient was treated as if he had possible right ACA infarct.  -Now on aspirin monotherapy  -Now on Pravachol, 80 mg daily  5.  Orthostatic hypotension  -Unrelated to Parkinson's.  His Parkinson's is far too mild, and really has not shown significant progression over the years.  -Patient off of midodrine and doing well.    Subjective:   Victor Osborne was seen today in follow up for Parkinsons disease and more recent hospital f/u for possible stroke and orthostasis. My previous records were reviewed prior to todays visit as well as outside records available to me.  He is off of the Plavix and on aspirin monotherapy.  He is on Pravachol, 80 mg daily.  He is tolerating this well.  Patient saw primary nurse practitioner April 21.  By that time, midodrine had been discontinued as patient had run out of it, and she recommended that patient continue to hold it.  He was at the emergency room April 25 complaining about wrist pain after a fall 2 weeks prior (he was at primary care just 4 days prior to that and I do not see they talked about that).  X-ray was negative and he was sent home.  He has had no falls since that time.  He states that his legs are getting weak.  His last physical therapy was done at home and was not as effective.  Current prescribed movement disorder medications: Carbidopa/levodopa 25/100, 2 tablets 3 times per day Pravachol, 80 mg  daily  ALLERGIES:  No Known Allergies  CURRENT MEDICATIONS:  Outpatient Encounter Medications as of 10/05/2021  Medication Sig   acetaminophen (TYLENOL) 325 MG tablet Take 650 mg by mouth every 6 (six) hours as needed.   alclomethasone (ACLOVATE) 0.05 % ointment Apply 1 application topically 2 (two) times daily as needed (for rash).   aspirin 325 MG EC tablet Take 1 tablet (325 mg total) by mouth daily.   Bacillus Coagulans-Inulin (PROBIOTIC-PREBIOTIC) 1-250 BILLION-MG CAPS Take 1 capsule by mouth daily.   Bee Pollen 550 MG CAPS Take 550 mg by mouth daily.   carbamazepine (TEGRETOL) 200 MG tablet Take 1 tablet (200 mg total) by mouth 3 (three) times daily.   carbidopa-levodopa (SINEMET IR) 25-100 MG tablet Take 2 tablets by mouth 3 (three) times daily.   cholecalciferol (VITAMIN D) 1000 UNITS tablet Take 2,000 Units by mouth daily.   Coenzyme Q10 (CO Q 10 PO) Take 1 capsule by mouth daily.   desoximetasone (TOPICORT) 0.25 % cream Apply 1 application topically 2 (two) times daily.   ferrous sulfate 324 (65 Fe) MG TBEC Take 324 mg by mouth.   Flaxseed, Linseed, (FLAX SEED OIL) 1300 MG CAPS Take 1 capsule by mouth daily.   FLUoxetine (PROZAC) 10 MG capsule Take 1 capsule (10 mg total) by mouth daily.   levothyroxine (LEVOTHROID) 125 MCG tablet Take 1 tablet (125 mcg total) by mouth daily before breakfast.  MAGNESIUM PO Take 1 capsule by mouth daily.   Multiple Vitamin (MULITIVITAMIN WITH MINERALS) TABS Take 1 tablet by mouth daily.   Omega-3 Fatty Acids (OMEGA-3 CF PO) Take 1,200 mg by mouth daily.   POTASSIUM GLUCONATE PO Take 1 capsule by mouth daily.   pravastatin (PRAVACHOL) 80 MG tablet Take 1 tablet (80 mg total) by mouth daily.   QUEtiapine (SEROQUEL XR) 50 MG TB24 24 hr tablet Take 1 tablet (50 mg total) by mouth at bedtime.   tamsulosin (FLOMAX) 0.4 MG CAPS capsule Take 0.4 mg by mouth at bedtime.   TURMERIC CURCUMIN PO Take 1 capsule by mouth daily.   No facility-administered  encounter medications on file as of 10/05/2021.    Objective:   PHYSICAL EXAMINATION:    VITALS:   Vitals:   10/05/21 1451  BP: 135/78  Pulse: 67  SpO2: 98%  Weight: 204 lb 9.6 oz (92.8 kg)  Height: '6\' 4"'$  (1.93 m)       GEN:  The patient appears stated age and is in NAD.  He is cheerful and joking. HEENT:  Normocephalic, atraumatic.  The mucous membranes are moist. The superficial temporal arteries are without ropiness or tenderness. CV:  RRR Lungs:  CTAB Neck/HEME:  There are no carotid bruits bilaterally.  Neurological examination:  Orientation: The patient is alert and oriented x3. Cranial nerves: There is good facial symmetry with minimal facial hypomimia. The speech is fluent and clear. Soft palate rises symmetrically and there is no tongue deviation. Hearing is intact to conversational tone. Sensation: Sensation is intact to light touch throughout Motor: Strength is at least antigravity x4.  Movement examination: Tone: There is nl tone in the UE/LE Abnormal movements: there is no rest tremor today. Coordination:  There is  decremation with RAM's, only with toe taps on the L (same as last visit) Gait and Station: The patient pushes off to arise.  He has his cane with him.  He is just slightly wide-based and just slightly off balance in the turn.  I have reviewed and interpreted the following labs independently    Chemistry      Component Value Date/Time   NA 140 03/30/2021 1255   NA 143 01/16/2019 0838   K 4.4 03/30/2021 1255   CL 107 03/30/2021 1255   CO2 25 03/30/2021 1255   BUN 25 (H) 03/30/2021 1255   BUN 15 01/16/2019 0838   CREATININE 1.30 (H) 03/30/2021 1255   CREATININE 1.18 08/14/2020 1112      Component Value Date/Time   CALCIUM 9.1 03/30/2021 1255   ALKPHOS 62 01/12/2021 1000   AST 16 01/12/2021 1000   ALT 4 01/12/2021 1000   BILITOT 0.6 01/12/2021 1000   BILITOT 0.3 01/16/2019 0838       Lab Results  Component Value Date   WBC 8.2  03/30/2021   HGB 14.5 03/30/2021   HCT 44.0 03/30/2021   MCV 99.1 03/30/2021   PLT 178 03/30/2021    Lab Results  Component Value Date   TSH 2.50 01/12/2021    Total time spent on today's visit was 20 minutes, including both face-to-face time and nonface-to-face time.  Time included that spent on review of records (prior notes available to me/labs/imaging if pertinent), discussing treatment and goals, answering patient's questions and coordinating care.   Cc:  [name], FNP

## 2021-10-05 ENCOUNTER — Ambulatory Visit (INDEPENDENT_AMBULATORY_CARE_PROVIDER_SITE_OTHER): Payer: Medicare Other | Admitting: Neurology

## 2021-10-05 ENCOUNTER — Encounter: Payer: Self-pay | Admitting: Neurology

## 2021-10-05 VITALS — BP 135/78 | HR 67 | Ht 76.0 in | Wt 204.6 lb

## 2021-10-05 DIAGNOSIS — I63421 Cerebral infarction due to embolism of right anterior cerebral artery: Secondary | ICD-10-CM

## 2021-10-05 DIAGNOSIS — G20 Parkinson's disease: Secondary | ICD-10-CM

## 2021-10-05 NOTE — Patient Instructions (Signed)
Local and Online Resources for Power over Parkinson's Group September 2023  LOCAL [place] PARKINSON'S GROUPS  Power over Parkinson's Group:   Power Over Parkinson's Patient Education Group will be Wednesday, September 13th-*Hybrid meting*- in person at [facility] location and via [facility] at 2 pm.   Upcoming Power over [employer] Meetings:  2nd Wednesdays of the month at 2 pm:  September 13th, October 11th, November 8th Contact Amy Marriott at [email or web address] if interested in participating in this group Parkinson's Care Partners Group:    3rd Mondays, Contact Misty Paladino Atypical Parkinsonian Patient Group:   4th Wednesdays, [place] If you are interested in participating in these groups with Misty, please contact her directly for how to join those meetings.  Her contact information is [email or web address].    LOCAL EVENTS AND NEW OFFERINGS New PWR! Moves [employer] Instructor-Led Classes offering at [employer]!  Wednesdays 1-2 pm.   Contact Vonna Kotyk at  [email or web address] or Caron Presume at [place], [email or web address] Dance for Parkinson 's classes will be on Tuesdays 9:30am-10:30am starting October 3-December 12 with a break the week of November 21 . Located in the [employer]  which is in the first floor of the [employer] ([place] for Parkinson's will be held on 2nd and 4th Mondays at 11:00am . First class will start  September 25th.  Located at the [place] ([place].) Through support from the [place] and Drumming for Parkinson's classes are free for both patients and caregivers.  Contact Misty Taylor-Paladino for more details about registering.  [place]:  [email or web address] PD Health at Home continues:  Mindfulness Mondays, Wellness  Wednesdays, Fitness Fridays  Upcoming Education:   Navigating Nutrition with PD.  Wednesday, Sept. 6th 1:00-2:00 pm Understanding Mind and Memory.  Wednesday, Sept. 20th 1:00-2:00 pm  Expert Briefing:    Parkinson's Disease and the Bladder.  Wednesday, Sept. 13th 1:00-2:00 pm Parkinson's and the Gut-Brain Connection.  Wednesday, Oct. 11th 1:00-2:00 pm Register for expert briefings (webinars) at [email or web address] Please check out their website to sign up for emails and see their full online offerings   [place]:  [email or web address]  Third Thursday Webinars:  On the third Thursday of every month at 12 p.m. ET, join our free live webinars to learn about various aspects of living with Parkinson's disease and our work to speed medical breakthroughs. Upcoming Webinar:  Stay tuned Check out additional information on their website to see their full online offerings  [employer]:  [email or web address] Upcoming Webinar:   Stay tuned Webinar Series:  Living with Parkinson's Meetup.   Third Thursdays each month, 3 pm Care Partner Monthly Meetup.  With Robin Searing Phinney.  First Tuesday of each month, 2 pm Check out additional information to Live Well Today on their website  Parkinson and Movement Disorders (PMD) Alliance:  www.[email or web address] [employer] Online:  Online Education Events Sign up for emails, which are sent weekly to give you updates on programming and online offerings  Parkinson's Association of the [place]:  www.[email or web address] Information on online support groups, education events, and online exercises including Yoga, Parkinson's exercises and more-LOTS of information on links to PD resources and online events Virtual Support Group through Parkinson's Association of the [place]; next one is scheduled for Wednesday, October 4th at 2 pm. (No September meeting  due to the symposium.  These are typically scheduled for the 1st Wednesday  of the month at 2 pm).  Visit website for details. Register for "Caring for Parkinson's-Caring for You", 9th Annual Symposium.  In-person event in [place].  September 9th.  To register:  [email or web address]_[email or web address] MOVEMENT AND EXERCISE OPPORTUNITIES PWR! Moves Classes at [place].  Wednesdays 10 and 11 am.   Contact Amy Marriott, PT [email or web address] if interested. NEW PWR! Moves Class offerings at [employer].  Wednesdays 1-2 pm.  Contact Vonna Kotyk at  [email or web address] or Caron Presume at [place],  [email or web address] Parkinson's Wellness Recovery (PWR! Moves)  www.[email or web address] Info on the PWR! Virtual Experience:  You will have access to our expertise through self-assessment, guided plans that start with the PD-specific fundamentals, educational content, tips, Q&A with an expert, and a growing Art therapist of PD-specific pre-recorded and live exercise classes of varying types and intensity - both physical and cognitive! If that is not enough, we offer 1:1 wellness consultations (in-person or virtual) to personalize your PWR! Research scientist (medical).  [place] Fridays:  As part of the PD Health @ Home program, this free video series focuses each week on one aspect of fitness designed to support people living with Parkinson's.  These weekly videos highlight the [place] recent fitness guidelines for people with Parkinson's disease. [email or web address] Dance for PD website is offering free, live-stream classes throughout the week, as well as links to [employer] of classes:  [email or web address] Virtual dance and Pilates for Parkinson's classes: Click on the Community Tab> Parkinson's Movement Initiative Tab.  To register for classes and for more  information, visit [email or web address] and click the "community" tab.  [employer] Parkinson's Cycling Classes  [employer]:  Thursdays @ Noon-Live classes at [employer] ([employer] at [email or web address] or [phone]) [employer]: Virtual Classes Mondays and Thursdays Jeanette Caprice classes Tuesday, Wednesday and Thursday (contact [place] at [email or web address]  or [phone]) [place] Varied levels of classes are offered Tuesdays and Thursdays at [employer].  Stretching with Verdis Frederickson weekly class is also offered for people with Parkinson's To observe a class or for more information, call [phone] or email Hezzie Bump at [email or web address] ADDITIONAL SUPPORT AND RESOURCES [employer]:Online Caregiver Education Opportunities:  www.well-[email or web address]/caregiver-education/caregiver-support-group.  You may also contact Vickki Muff at [email or web address] or [phone].    Coping with Difficult Caregiver Emotions.  Wednesday, September 20th, 10:30 am-12.  The [facility], [facility] Navigating the Maze of Senior Care Options.  Thursday, September 28th, 4-5:15 pm.  The [facility]. Well-Spring Navigator:  [facility] program, a free service to help individuals and families through the journey of determining care for older adults.  The "Navigator" is a [employer], Education officer, museum, who will speak with a prospective client and/or loved ones to provide an assessment of the situation and a set of recommendations for a personalized care plan -- all free of charge, and whether [employer] offers the needed service or not. If the need is not a service we provide, we are well-connected with reputable programs in town that we can refer you to.  www.well-springsolutions.org or to speak with the Navigator, call [phone].

## 2021-10-08 NOTE — Progress Notes (Signed)
Remote pacemaker transmission.   

## 2021-10-11 ENCOUNTER — Other Ambulatory Visit: Payer: Self-pay

## 2021-10-11 ENCOUNTER — Telehealth: Payer: Self-pay | Admitting: Neurology

## 2021-10-11 DIAGNOSIS — G20 Parkinson's disease: Secondary | ICD-10-CM

## 2021-10-11 MED ORDER — CARBIDOPA-LEVODOPA 25-100 MG PO TABS: 2.0000 | ORAL_TABLET | Freq: Three times a day (TID) | ORAL | 1 refills | Status: DC

## 2021-10-11 NOTE — Telephone Encounter (Signed)
Sent prescription called pateint

## 2021-10-11 NOTE — Telephone Encounter (Signed)
1. Which medications need refilled? (List name and dosage, if known) carbidopa levodopa 25/'100mg'$   2. Which pharmacy/location is medication to be sent to? (include street and city if Management consultant) [facility] on [address]  3. Do they need a 30 day or 90 day supply? 90  Pt will be out of the medication tomorrow.

## 2021-10-14 ENCOUNTER — Ambulatory Visit: Payer: Medicare Other | Attending: Neurology | Admitting: Physical Therapy

## 2021-10-14 ENCOUNTER — Encounter: Payer: Self-pay | Admitting: Physical Therapy

## 2021-10-14 ENCOUNTER — Other Ambulatory Visit: Payer: Self-pay

## 2021-10-14 DIAGNOSIS — R2681 Unsteadiness on feet: Secondary | ICD-10-CM | POA: Insufficient documentation

## 2021-10-14 DIAGNOSIS — M6281 Muscle weakness (generalized): Secondary | ICD-10-CM | POA: Insufficient documentation

## 2021-10-14 DIAGNOSIS — R2689 Other abnormalities of gait and mobility: Secondary | ICD-10-CM | POA: Diagnosis present

## 2021-10-14 DIAGNOSIS — R293 Abnormal posture: Secondary | ICD-10-CM | POA: Insufficient documentation

## 2021-10-14 DIAGNOSIS — G20 Parkinson's disease: Secondary | ICD-10-CM | POA: Diagnosis not present

## 2021-10-14 NOTE — Therapy (Signed)
OUTPATIENT PHYSICAL THERAPY PARKINSON'S EVALUATION   Patient Name: Victor Osborne MRN: [id number] DOB:[birth year], 88 y.o., male Today's Date: 10/14/2021    PT End of Session - 10/14/21 1521     Visit Number 1    Date for PT Re-Evaluation 11/25/21    Authorization Type Medicare & [employer]    PT Start Time 1521    PT Stop Time 8250    PT Time Calculation (min) 56 min    Activity Tolerance Patient tolerated treatment well    Behavior During Therapy WFL for tasks assessed/performed             Past Medical History:  Diagnosis Date   Bipolar 1 disorder ([place])    COPD (chronic obstructive pulmonary disease) ([place])    Depression    First degree AV block    Hypertension    pt denies but was in medical record   Hypoglycemia, unspecified    Myalgia and myositis, unspecified    Obesity    Pneumonia 1970   Skin cancer (melanoma) ([place])    Thyroid disease    Unspecified hypothyroidism    UPJ obstruction, congenital    Past Surgical History:  Procedure Laterality Date   APPENDECTOMY  1939   malignant melanoma removed from right forehead  2008   PERMANENT PACEMAKER INSERTION N/A 06/19/2013   Procedure: PERMANENT PACEMAKER INSERTION;  Surgeon: Evans Lance, MD;  Location: [facility] CATH LAB;  Service: Cardiovascular;  Laterality: N/A;   right big toe  surgery  2009   TRANSURETHRAL RESECTION OF PROSTATE  02/04/2011   Procedure: TRANSURETHRAL RESECTION OF THE PROSTATE (TURP);  Surgeon: Dutch Gray, MD;  Location: [facility] ORS;  Service: Urology;  Laterality: N/A;   URETHRA SURGERY  2010   reconstruction   Patient Active Problem List   Diagnosis Date Noted   History of neoplasm 04/12/2021   Melanocytic nevi of trunk 04/12/2021   Other seborrheic keratosis 04/12/2021   Cerebral embolism with cerebral infarction 03/31/2021   Left leg weakness 03/30/2021   Chronic post-traumatic stress disorder 12/05/2017   Bipolar I disorder, current or most recent episode depressed, in partial  remission ([place]) 12/05/2017   CHB (complete heart block) ([place]) 01/27/2017   HTN (hypertension) 01/27/2017   Hypothyroidism 10/11/2016   Parkinson disease ([place]) 09/08/2016   Pacemaker 09/24/2013   SSS (sick sinus syndrome) ([place]) 06/19/2013   First degree AV block 04/09/2013   Allergic rhinitis 03/17/2013   Obstructive sleep apnea 12/18/2006   COPD mixed type ([place]) 12/18/2006   History of malignant melanoma of skin 12/18/2006   Diabetes mellitus ([place]) 12/18/2006    PCP: [name], FNP   REFERRING PROVIDER: Ludwig Clarks, DO   REFERRING DIAG: G20 (ICD-10-CM) - Parkinson disease ([place])  THERAPY DIAG:  Other abnormalities of gait and mobility - Plan: PT plan of care cert/re-cert  Unsteadiness on feet - Plan: PT plan of care cert/re-cert  Muscle weakness (generalized) - Plan: PT plan of care cert/re-cert  Abnormal posture - Plan: PT plan of care cert/re-cert  RATIONALE FOR EVALUATION AND TREATMENT: Rehabilitation  ONSET DATE: Initial PD diagnosis ~16 yrs ago  NEXT MD VISIT: 04/07/2022   SUBJECTIVE:      SUBJECTIVE STATEMENT: Pt reports since his last PT episode he was hospitalized in March for a possible CVA/TIA due to episodic L LE weakness orthostatic hypotension causing recurrent symptoms but was unable to have an MRI to confirm the CVA d/t his PPM. HH PT/OT following D/C but pt states  the therapist did not do much with him. Since then he feels that he has gotten weaker. He now uses the rollator starting mid afternoon due to fatigue or at night when getting up, otherwise he uses his cane or walking stick. He denies any recent falls.  Pt accompanied by: self  PAIN:  Are you having pain? No  PERTINENT HISTORY:  PPM 2 1st degree heart block & SA node dysfunction, possible CVA (ACA infarct), orthostatic hypotension, OSA, COPD, hypothyroidism, chronic PTSD, bipolar I disorder,  decreased energy/endurance since COVID-19 in ~March 2022    PRECAUTIONS: Fall and  ICD/Pacemaker  WEIGHT BEARING RESTRICTIONS No  FALLS: Has patient fallen in last 6 months? No  LIVING ENVIRONMENT: Lives with: lives alone and with his dog Lives in: [facility] (3rd floor) Stairs:  Elevator Has following equipment at home: Single point cane and hiking pole  OCCUPATION: Retired  PLOF: Independent and Leisure: being outdoors - walking his dog, and stays active most of the day     PATIENT GOALS: "Gain more strength in my legs and gain more balance overall."   OBJECTIVE:   DIAGNOSTIC FINDINGS:  N/A  COGNITION: Overall cognitive status: Within functional limits for tasks assessed   SENSATION: WFL  COORDINATION: WFL for LE  MUSCLE TONE: WNL  MUSCLE LENGTH: Hamstrings: mod/severe tight B ITB: mod tight B Piriformis: mod tight B Hip IR: mod/severe tight B Hip flexors: mod/severe tight B Quads: mod tight R>L Heelcord: NT  POSTURE:  rounded shoulders, forward head, increased thoracic kyphosis, and flexed trunk   LOWER EXTREMITY ROM:    WFL other than limitations due to muscle tightness as indicated   LOWER EXTREMITY MMT:    MMT Right Eval Left Eval  Hip flexion 4+ 4  Hip extension 4 Limited ROM 4+ Limited ROM  Hip abduction 4+ 4+  Hip adduction 4+ 4  Hip internal rotation 4+ 4+  Hip external rotation 4+ 4  Knee flexion 5 5  Knee extension 5 5  Ankle dorsiflexion 4+ 4  Ankle plantarflexion 5 5-  Ankle inversion    Ankle eversion    (Blank rows = not tested)  BED MOBILITY:  Sit to supine SBA Supine to sit SBA Rolling to Right SBA Rolling to Left SBA  TRANSFERS: Assistive device utilized: None  Sit to stand: Complete Independence and Modified independence - UE assist necessary from lower surface height Stand to sit: Modified independence - uncontrolled descent w/o UE assist Chair to chair: Modified independence Floor:  NT  GAIT: Gait pattern: step through pattern, decreased arm swing- Left, decreased step  length- Right, decreased trunk rotation, trunk flexed, and wide BOS Distance walked: 80 Assistive device utilized: Environmental consultant - 4 wheeled and None - 10MWT/balance assessments completed w/o AD Level of assistance: Modified independence Comments: Gait speed = 3.90 ft/sec (decreased from 4.34 ft/sec as of prior discharge from PT)  RAMP: Level of Assistance:  NT Assistive device utilized:  NT Ramp Comments:   CURB:  Level of Assistance:  NT Assistive device utilized:  NT Curb Comments:   STAIRS:  Level of Assistance:  NT - will assess as part of FGA next visit   FUNCTIONAL TESTS:  5 times sit to stand: 19.32 sec, >16 sec indicates a fall risk for patients with PD Timed up and go (TUG): Normal = 11.06 sec; Manual = 14.04 sec; Cognitive = 16.59 sec (difficulty with cognitive task - Cognitive TUG > 15 sec = high fall risk in community dwelling older adults)  10 meter walk test: 8.41 sec (no AD); Gait speed = 3.90 ft/sec Berg Balance Scale: 42/56; 37-45 = significant risk for falls (>80%)  Functional gait assessment: TBA next visit  Scores as of discharge from last PT episode - December 2022: 5 times sit to stand: 13.75 sec Timed up and go (TUG): Normal = 9.38 sec; Manual = NT; Cognitive = 11.88 sec 10 meter walk test: 7.56 sec (no AD); Gait speed = 4.34 ft/sec Berg: 53/56; 52-55 lower (> 25%) fall risk Functional gait assessment: 27/30; 25-28 = low risk fall   PATIENT SURVEYS:  ABC scale 1060 / 1600 = 66.3%   TODAY'S TREATMENT:  10/14/21 Eval only   PATIENT EDUCATION: Education details: PT eval findings and anticipated POC Person educated: Patient Education method: Explanation Education comprehension: verbalized understanding   HOME EXERCISE PROGRAM: [id number] (from last PT episode)   ASSESSMENT:  CLINICAL IMPRESSION: Victor Osborne is an 88 y.o. y.o. male who was seen today for physical therapy evaluation and treatment for Parkinson's disease. He was first diagnosed  16+ years ago and has completed prior PT episodes in 2018 and 2022 within the [facility] system. Since his last PT episode he reports he was hospitalized for a presumed CVA vs TIA with L-sided weakness in March, after which he completed a HH PT episode. Since then, he feels that his strength has been declining causing him to become more dependent on his rollator where he had previously been ambulatory only intermittently with a cane or walking stick. He presents with decreased timing and coordination of gait, decreased balance, abnormal posture, bradykinesia with transfers, LE weakness and postural instability. Berg score of 42/56 indicates a significant (>80%) risk for falls and demonstrates a notable decline from his score of 53/56 as of his last discharge from PT. His 5xSTS, TUG and gait speed assessments have also demonstrated a decline from his prior levels (FGA testing pending next visit) indicating an increased risk for falls although he does not recall any falls in the past 6 months. He has lessened/slowed outdoor walking due to balance issues. Victor Osborne" will benefit from skilled PT to address above deficits affecting mobility and gait instability/balance to allow for improved QOL and functional mobility/gait with reduced fall risk.    OBJECTIVE IMPAIRMENTS: Abnormal gait, decreased activity tolerance, decreased balance, decreased endurance, decreased mobility, difficulty walking, decreased strength, decreased safety awareness, impaired perceived functional ability, impaired flexibility, improper body mechanics, and postural dysfunction.   ACTIVITY LIMITATIONS: carrying, lifting, standing, squatting, stairs, transfers, bed mobility, reach over head, locomotion level, and caring for others  PARTICIPATION LIMITATIONS: meal prep, cleaning, laundry, driving, shopping, and community activity  PERSONAL FACTORS: Age, Fitness, Past/current experiences, Time since onset of  injury/illness/exacerbation, and 3+ comorbidities: PPM 2 1st degree heart block & SA node dysfunction, possible CVA (ACA infarct), orthostatic hypotension, OSA, COPD, hypothyroidism, chronic PTSD, bipolar I disorder,  decreased energy/endurance since COVID-19 in ~March 2022   are also affecting patient's functional outcome.   REHAB POTENTIAL: Good  CLINICAL DECISION MAKING: Evolving/moderate complexity  EVALUATION COMPLEXITY: Moderate   GOALS: Goals reviewed with patient? Yes  SHORT TERM GOALS: Target date: 11/04/2021   Patient will be independent with initial HEP. Baseline:  Goal status: INITIAL  2.  Patient will demonstrate decreased fall risk by scoring </= 15 sec on cognitive TUG. Baseline: 16.59 sec Goal status: INITIAL  3.  Patient will be educated on strategies to decrease risk of falls.  Baseline:  Goal status: INITIAL  LONG  TERM GOALS: Target date: 11/25/2021   Patient will be independent with ongoing/advanced HEP for self-management at home incorporating PWR! Moves as indicated .  Baseline:  Goal status: INITIAL  2.  Patient will ambulate at least 1000 ft with LRAD including outdoor and unlevel surfaces, independently with no LOB, for improved community gait. Baseline:  Goal status: INITIAL  3.  Patient will be able to step up/down curb safely with LRAD for safety with community ambulation.  Baseline:  Goal status: INITIAL   4.  Patient will improve 5x STS time to </= 16 seconds to demonstrate improved functional strength and transfer efficiency. Baseline: 19.32 sec Goal status: INITIAL  5.  Patient will demonstrate at least 25/30 on FGA to improve gait stability and reduce risk for falls. (MCID = 4 points) Baseline: TBD Goal status: INITIAL  6.  Patient will improve Berg score to >/= 52/56 to improve safety and stability with ADLs in standing and reduce risk for falls. (MCID = 8 points)   Baseline: 42/56 Goal status: INITIAL  7.  Patient will report >/=  80% on ABC scale to demonstrate improved balance confidence and decreased risk for falls. Baseline: 1060 / 1600 = 66.3% Goal status: INITIAL  8. Patient will verbalize understanding of local Parkinson's disease community resources, including community fitness post d/c. Baseline:  Goal status: INITIAL   PLAN: PT FREQUENCY: 2x/week  PT DURATION: 6 weeks  PLANNED INTERVENTIONS: Therapeutic exercises, Therapeutic activity, Neuromuscular re-education, Balance training, Gait training, Patient/Family education, Self Care, Joint mobilization, Stair training, DME instructions, Manual therapy, and Re-evaluation  PLAN FOR NEXT SESSION: Complete FGA assessment; create initial [place], PT 10/14/2021, 7:28 PM

## 2021-10-18 ENCOUNTER — Ambulatory Visit: Payer: Medicare Other | Admitting: Physical Therapy

## 2021-10-20 ENCOUNTER — Encounter: Payer: Self-pay | Admitting: Physical Therapy

## 2021-10-20 ENCOUNTER — Ambulatory Visit: Payer: Medicare Other | Attending: Neurology | Admitting: Physical Therapy

## 2021-10-20 DIAGNOSIS — R2681 Unsteadiness on feet: Secondary | ICD-10-CM | POA: Insufficient documentation

## 2021-10-20 DIAGNOSIS — R2689 Other abnormalities of gait and mobility: Secondary | ICD-10-CM | POA: Insufficient documentation

## 2021-10-20 DIAGNOSIS — M6281 Muscle weakness (generalized): Secondary | ICD-10-CM | POA: Insufficient documentation

## 2021-10-20 DIAGNOSIS — R293 Abnormal posture: Secondary | ICD-10-CM | POA: Diagnosis present

## 2021-10-20 NOTE — Therapy (Signed)
OUTPATIENT PHYSICAL THERAPY TREATMENT   Patient Name: Victor Osborne MRN: [id number] DOB:20-Jul-1932, 88 y.o., male Today's Date: 10/20/2021    PT End of Session - 10/20/21 1019     Visit Number 2    Date for PT Re-Evaluation 11/25/21    Authorization Type Medicare & [employer]    PT Start Time 1019    PT Stop Time 1059    PT Time Calculation (min) 40 min    Activity Tolerance Patient tolerated treatment well    Behavior During Therapy WFL for tasks assessed/performed             Past Medical History:  Diagnosis Date   Bipolar 1 disorder ([place])    COPD (chronic obstructive pulmonary disease) ([place])    Depression    First degree AV block    Hypertension    pt denies but was in medical record   Hypoglycemia, unspecified    Myalgia and myositis, unspecified    Obesity    Pneumonia 1970   Skin cancer (melanoma) ([place])    Thyroid disease    Unspecified hypothyroidism    UPJ obstruction, congenital    Past Surgical History:  Procedure Laterality Date   APPENDECTOMY  1939   malignant melanoma removed from right forehead  2008   PERMANENT PACEMAKER INSERTION N/A 06/19/2013   Procedure: PERMANENT PACEMAKER INSERTION;  Surgeon: Evans Lance, MD;  Location: [facility] CATH LAB;  Service: Cardiovascular;  Laterality: N/A;   right big toe  surgery  2009   TRANSURETHRAL RESECTION OF PROSTATE  02/04/2011   Procedure: TRANSURETHRAL RESECTION OF THE PROSTATE (TURP);  Surgeon: Dutch Gray, MD;  Location: [facility] ORS;  Service: Urology;  Laterality: N/A;   URETHRA SURGERY  2010   reconstruction   Patient Active Problem List   Diagnosis Date Noted   History of neoplasm 04/12/2021   Melanocytic nevi of trunk 04/12/2021   Other seborrheic keratosis 04/12/2021   Cerebral embolism with cerebral infarction 03/31/2021   Left leg weakness 03/30/2021   Chronic post-traumatic stress disorder 12/05/2017   Bipolar I disorder, current or most recent episode depressed, in partial remission ([place])  12/05/2017   CHB (complete heart block) ([place]) 01/27/2017   HTN (hypertension) 01/27/2017   Hypothyroidism 10/11/2016   Parkinson disease 09/08/2016   Pacemaker 09/24/2013   SSS (sick sinus syndrome) ([place]) 06/19/2013   First degree AV block 04/09/2013   Allergic rhinitis 03/17/2013   Obstructive sleep apnea 12/18/2006   COPD mixed type ([place]) 12/18/2006   History of malignant melanoma of skin 12/18/2006   Diabetes mellitus ([place]) 12/18/2006    PCP: [name], FNP   REFERRING PROVIDER: Ludwig Clarks, DO   REFERRING DIAG: G20 (ICD-10-CM) - Parkinson disease ([place])  THERAPY DIAG:  Other abnormalities of gait and mobility  Unsteadiness on feet  Muscle weakness (generalized)  Abnormal posture  RATIONALE FOR EVALUATION AND TREATMENT: Rehabilitation  ONSET DATE: Initial PD diagnosis ~16 yrs ago  NEXT MD VISIT: 04/07/2022   SUBJECTIVE:      SUBJECTIVE STATEMENT: Pt reports he feels tired with no stamina - attributes this to doing too much. Pt missed his last PT appt due to illness.  Pt accompanied by: self  PAIN:  Are you having pain? No  PERTINENT HISTORY:  PPM 2 1st degree heart block & SA node dysfunction, possible CVA (ACA infarct), orthostatic hypotension, OSA, COPD, hypothyroidism, chronic PTSD, bipolar I disorder,  decreased energy/endurance since COVID-19 in ~March 2022    PRECAUTIONS: Fall  and ICD/Pacemaker  WEIGHT BEARING RESTRICTIONS No  FALLS: Has patient fallen in last 6 months? No  LIVING ENVIRONMENT: Lives with: lives alone and with his dog Lives in: [facility] facility (3rd floor) Stairs:  Elevator Has following equipment at home: Single point cane and hiking pole  OCCUPATION: Retired  PLOF: Independent and Leisure: being outdoors - walking his dog, and stays active most of the day     PATIENT GOALS: "Gain more strength in my legs and gain more balance overall."   OBJECTIVE:   DIAGNOSTIC FINDINGS:   N/A  COGNITION: Overall cognitive status: Within functional limits for tasks assessed   SENSATION: WFL  COORDINATION: WFL for LE  MUSCLE TONE: WNL  MUSCLE LENGTH: Hamstrings: mod/severe tight B ITB: mod tight B Piriformis: mod tight B Hip IR: mod/severe tight B Hip flexors: mod/severe tight B Quads: mod tight R>L Heelcord: NT  POSTURE:  rounded shoulders, forward head, increased thoracic kyphosis, and flexed trunk   LOWER EXTREMITY ROM:    WFL other than limitations due to muscle tightness as indicated   LOWER EXTREMITY MMT:    MMT Right Eval Left Eval  Hip flexion 4+ 4  Hip extension 4 Limited ROM 4+ Limited ROM  Hip abduction 4+ 4+  Hip adduction 4+ 4  Hip internal rotation 4+ 4+  Hip external rotation 4+ 4  Knee flexion 5 5  Knee extension 5 5  Ankle dorsiflexion 4+ 4  Ankle plantarflexion 5 5-  Ankle inversion    Ankle eversion    (Blank rows = not tested)  BED MOBILITY:  Sit to supine SBA Supine to sit SBA Rolling to Right SBA Rolling to Left SBA  TRANSFERS: Assistive device utilized: None  Sit to stand: Complete Independence and Modified independence - UE assist necessary from lower surface height Stand to sit: Modified independence - uncontrolled descent w/o UE assist Chair to chair: Modified independence Floor:  NT  GAIT: Gait pattern: step through pattern, decreased arm swing- Left, decreased step length- Right, decreased trunk rotation, trunk flexed, and wide BOS Distance walked: 80 Assistive device utilized: Environmental consultant - 4 wheeled and None - 10MWT/balance assessments completed w/o AD Level of assistance: Modified independence Comments: Gait speed = 3.90 ft/sec (decreased from 4.34 ft/sec as of prior discharge from PT)  RAMP: Level of Assistance:  NT Assistive device utilized:  NT Ramp Comments:   CURB:  Level of Assistance:  NT Assistive device utilized:  NT Curb Comments:   STAIRS: (10/20/21)  Level of Assistance: SBA  Stair  Negotiation Technique: Alternating Pattern  with Single Rail on Right  Number of Stairs: 14   Height of Stairs: 7  Comments: foot placement on steps with significant overhang of heel putting him at risk for posterior LOB    FUNCTIONAL TESTS:  5 times sit to stand: 19.32 sec, >16 sec indicates a fall risk for patients with PD Timed up and go (TUG): Normal = 11.06 sec; Manual = 14.04 sec; Cognitive = 16.59 sec (difficulty with cognitive task - Cognitive TUG > 15 sec = high fall risk in community dwelling older adults) 10 meter walk test: 8.41 sec (no AD); Gait speed = 3.90 ft/sec Berg Balance Scale: 42/56; 37-45 = significant risk for falls (>80%)  Functional gait assessment: 17/30; < 19 = high risk fall (10/20/21)  Scores as of discharge from last PT episode - December 2022: 5 times sit to stand: 13.75 sec Timed up and go (TUG): Normal = 9.38 sec; Manual = NT; Cognitive =  11.88 sec 10 meter walk test: 7.56 sec (no AD); Gait speed = 4.34 ft/sec Berg: 53/56; 52-55 lower (> 25%) fall risk Functional gait assessment: 27/30; 25-28 = low risk fall   PATIENT SURVEYS:  ABC scale 1060 / 1600 = 66.3%   TODAY'S TREATMENT:  10/20/21 THERAPEUTIC EXERCISE: to improve flexibility, strength and mobility.  Verbal and tactile cues throughout for technique.  NuStep - L4 x 5 min Seated HS stretch +/- strap for heelcord stretch 2 x 30" bil Seated figure-4 hip hinge piriformis stretch 2 x 30" bil Standing forward lunge hip flexor stretch 2 x 30" bil  THERAPEUTIC ACTIVITIES: FGA = 17/30; < 19 = high risk fall   GAIT TRAINING: To normalize gait pattern and improve safety with rollator . 200 ft with rollator - cues for upright posture, fwd gaze and improved rollator proximity with step placement with heel strike between rear wheel of rollator   10/14/21 Eval only   PATIENT EDUCATION: Education details: PT eval findings and anticipated POC Person educated: Patient Education method:  Explanation Education comprehension: verbalized understanding   HOME EXERCISE PROGRAM: Access Code: [email or web address] URL: [email or web address] Date: 10/20/2021 Prepared by: Annie Paras  Exercises - Seated Hamstring Stretch with Strap  - 2 x daily - 7 x weekly - 3 reps - 30 sec hold - Seated Figure 4 Piriformis Stretch  - 2 x daily - 7 x weekly - 3 reps - 30 sec hold - Standing Hip Flexor Stretch  - 2 x daily - 7 x weekly - 2 sets - 10 reps - 30 sec hold   ASSESSMENT:  CLINICAL IMPRESSION: Nicolette Bang" returning after missing last visit due to illness and noting increased dependency on rollator today. FGA assessment completed with pt initially requiring rollator support but felt comfortable completing FGA testing w/o AD once he got moving more. FGA score of 17/30 revealing significant decline from 27/30 as of discharge from last PT episode with current score indicating a high risk for falls. Initiated instruction in HEP with initial exercises targeting stretching to improve flexibility and allow for more upright posture with gait and mobility. Bill observed ambulating with increased trunk flexion and rollator extended too far forward at full arm extension today, therefore cues provided for better upright posture and rollator proximity, aiming for foot placement between rear wheels of rollator.  OBJECTIVE IMPAIRMENTS: Abnormal gait, decreased activity tolerance, decreased balance, decreased endurance, decreased mobility, difficulty walking, decreased strength, decreased safety awareness, impaired perceived functional ability, impaired flexibility, improper body mechanics, and postural dysfunction.   ACTIVITY LIMITATIONS: carrying, lifting, standing, squatting, stairs, transfers, bed mobility, reach over head, locomotion level, and caring for others  PARTICIPATION LIMITATIONS: meal prep, cleaning, laundry, driving, shopping, and community activity  PERSONAL FACTORS: Age, Fitness,  Past/current experiences, Time since onset of injury/illness/exacerbation, and 3+ comorbidities: PPM 2 1st degree heart block & SA node dysfunction, possible CVA (ACA infarct), orthostatic hypotension, OSA, COPD, hypothyroidism, chronic PTSD, bipolar I disorder,  decreased energy/endurance since COVID-19 in ~March 2022   are also affecting patient's functional outcome.   REHAB POTENTIAL: Good  CLINICAL DECISION MAKING: Evolving/moderate complexity  EVALUATION COMPLEXITY: Moderate   GOALS: Goals reviewed with patient? Yes  SHORT TERM GOALS: Target date: 11/04/2021   Patient will be independent with initial HEP. Baseline:  Goal status: IN PROGRESS  2.  Patient will demonstrate decreased fall risk by scoring </= 15 sec on cognitive TUG. Baseline: 16.59 sec Goal status: IN PROGRESS  3.  Patient will be educated on strategies  to decrease risk of falls.  Baseline:  Goal status: IN PROGRESS  LONG TERM GOALS: Target date: 11/25/2021   Patient will be independent with ongoing/advanced HEP for self-management at home incorporating PWR! Moves as indicated .  Baseline:  Goal status: IN PROGRESS  2.  Patient will ambulate at least 1000 ft with LRAD including outdoor and unlevel surfaces, independently with no LOB, for improved community gait. Baseline:  Goal status: IN PROGRESS  3.  Patient will be able to step up/down curb safely with LRAD for safety with community ambulation.  Baseline:  Goal status: IN PROGRESS   4.  Patient will improve 5x STS time to </= 16 seconds to demonstrate improved functional strength and transfer efficiency. Baseline: 19.32 sec Goal status: IN PROGRESS  5.  Patient will demonstrate at least 25/30 on FGA to improve gait stability and reduce risk for falls. (MCID = 4 points) Baseline: TBD Goal status: IN PROGRESS  6.  Patient will improve Berg score to >/= 52/56 to improve safety and stability with ADLs in standing and reduce risk for falls. (MCID = 8  points)   Baseline: 42/56 Goal status: IN PROGRESS  7.  Patient will report >/= 80% on ABC scale to demonstrate improved balance confidence and decreased risk for falls. Baseline: 1060 / 1600 = 66.3% Goal status: IN PROGRESS  8. Patient will verbalize understanding of local Parkinson's disease community resources, including community fitness post d/c. Baseline:  Goal status: IN PROGRESS   PLAN: PT FREQUENCY: 2x/week  PT DURATION: 6 weeks  PLANNED INTERVENTIONS: Therapeutic exercises, Therapeutic activity, Neuromuscular re-education, Balance training, Gait training, Patient/Family education, Self Care, Joint mobilization, Stair training, DME instructions, Manual therapy, and Re-evaluation  PLAN FOR NEXT SESSION: review initial HEP stretches and introduce strengthening +/- PWR! Moves; review rollator gait safety    Percival Spanish, PT 10/20/2021, 12:32 PM

## 2021-10-25 ENCOUNTER — Encounter: Payer: Self-pay | Admitting: Physical Therapy

## 2021-10-25 ENCOUNTER — Ambulatory Visit: Payer: Medicare Other | Admitting: Physical Therapy

## 2021-10-25 DIAGNOSIS — R2689 Other abnormalities of gait and mobility: Secondary | ICD-10-CM | POA: Diagnosis not present

## 2021-10-25 DIAGNOSIS — R2681 Unsteadiness on feet: Secondary | ICD-10-CM

## 2021-10-25 DIAGNOSIS — R293 Abnormal posture: Secondary | ICD-10-CM

## 2021-10-25 DIAGNOSIS — M6281 Muscle weakness (generalized): Secondary | ICD-10-CM

## 2021-10-25 NOTE — Therapy (Signed)
OUTPATIENT PHYSICAL THERAPY TREATMENT   Patient Name: Victor Osborne MRN: [id number] DOB:1932-09-14, 88 y.o., male Today's Date: 10/25/2021    PT End of Session - 10/25/21 1104     Visit Number 3    Date for PT Re-Evaluation 11/25/21    Authorization Type Medicare & [employer]    PT Start Time 1104    PT Stop Time 1143    PT Time Calculation (min) 39 min    Activity Tolerance Patient tolerated treatment well    Behavior During Therapy WFL for tasks assessed/performed             Past Medical History:  Diagnosis Date   Bipolar 1 disorder ([place])    COPD (chronic obstructive pulmonary disease) ([place])    Depression    First degree AV block    Hypertension    pt denies but was in medical record   Hypoglycemia, unspecified    Myalgia and myositis, unspecified    Obesity    Pneumonia 1970   Skin cancer (melanoma) ([place])    Thyroid disease    Unspecified hypothyroidism    UPJ obstruction, congenital    Past Surgical History:  Procedure Laterality Date   APPENDECTOMY  1939   malignant melanoma removed from right forehead  2008   [place] N/A 06/19/2013   Procedure: PERMANENT PACEMAKER INSERTION;  Surgeon: Evans Lance, MD;  Location: [facility] CATH LAB;  Service: Cardiovascular;  Laterality: N/A;   right big toe  surgery  2009   TRANSURETHRAL RESECTION OF PROSTATE  02/04/2011   Procedure: TRANSURETHRAL RESECTION OF THE PROSTATE (TURP);  Surgeon: Dutch Gray, MD;  Location: [facility] ORS;  Service: Urology;  Laterality: N/A;   URETHRA SURGERY  2010   reconstruction   Patient Active Problem List   Diagnosis Date Noted   History of neoplasm 04/12/2021   Melanocytic nevi of trunk 04/12/2021   Other seborrheic keratosis 04/12/2021   Cerebral embolism with cerebral infarction 03/31/2021   Left leg weakness 03/30/2021   Chronic post-traumatic stress disorder 12/05/2017   Bipolar I disorder, current or most recent episode depressed, in partial remission ([place])  12/05/2017   CHB (complete heart block) ([place]) 01/27/2017   HTN (hypertension) 01/27/2017   Hypothyroidism 10/11/2016   Parkinson disease 09/08/2016   Pacemaker 09/24/2013   SSS (sick sinus syndrome) ([place]) 06/19/2013   First degree AV block 04/09/2013   Allergic rhinitis 03/17/2013   Obstructive sleep apnea 12/18/2006   COPD mixed type ([place]) 12/18/2006   History of malignant melanoma of skin 12/18/2006   Diabetes mellitus ([place]) 12/18/2006    PCP: [name], FNP   REFERRING PROVIDER: Ludwig Clarks, DO   REFERRING DIAG: G20 (ICD-10-CM) - Parkinson disease ([place])  THERAPY DIAG:  Other abnormalities of gait and mobility  Unsteadiness on feet  Muscle weakness (generalized)  Abnormal posture  RATIONALE FOR EVALUATION AND TREATMENT: Rehabilitation  ONSET DATE: Initial PD diagnosis ~16 yrs ago  NEXT MD VISIT: 04/07/2022   SUBJECTIVE:      SUBJECTIVE STATEMENT: Pt denies pain but states he still feels weak today. He states he has been too busy to try the HEP.  PAIN:  Are you having pain? No  PERTINENT HISTORY:  PPM 2 1st degree heart block & SA node dysfunction, possible CVA (ACA infarct), orthostatic hypotension, OSA, COPD, hypothyroidism, chronic PTSD, bipolar I disorder,  decreased energy/endurance since COVID-19 in ~March 2022    PRECAUTIONS: Fall and ICD/Pacemaker  WEIGHT BEARING RESTRICTIONS No  FALLS: Has patient fallen in last 6 months? No  LIVING ENVIRONMENT: Lives with: lives alone and with his dog Lives in: [place] [facility] facility (3rd floor) Stairs:  Elevator Has following equipment at home: Single point cane and hiking pole  OCCUPATION: Retired  PLOF: Independent and Leisure: being outdoors - walking his dog, and stays active most of the day     PATIENT GOALS: "Gain more strength in my legs and gain more balance overall."   OBJECTIVE:   DIAGNOSTIC FINDINGS:  N/A  COGNITION: Overall cognitive status: Within functional limits  for tasks assessed   SENSATION: WFL  COORDINATION: WFL for LE  MUSCLE TONE: WNL  MUSCLE LENGTH: Hamstrings: mod/severe tight B ITB: mod tight B Piriformis: mod tight B Hip IR: mod/severe tight B Hip flexors: mod/severe tight B Quads: mod tight R>L Heelcord: NT  POSTURE:  rounded shoulders, forward head, increased thoracic kyphosis, and flexed trunk   LOWER EXTREMITY ROM:    WFL other than limitations due to muscle tightness as indicated   LOWER EXTREMITY MMT:    MMT Right Eval Left Eval  Hip flexion 4+ 4  Hip extension 4 Limited ROM 4+ Limited ROM  Hip abduction 4+ 4+  Hip adduction 4+ 4  Hip internal rotation 4+ 4+  Hip external rotation 4+ 4  Knee flexion 5 5  Knee extension 5 5  Ankle dorsiflexion 4+ 4  Ankle plantarflexion 5 5-  Ankle inversion    Ankle eversion    (Blank rows = not tested)  BED MOBILITY:  Sit to supine SBA Supine to sit SBA Rolling to Right SBA Rolling to Left SBA  TRANSFERS: Assistive device utilized: None  Sit to stand: Complete Independence and Modified independence - UE assist necessary from lower surface height Stand to sit: Modified independence - uncontrolled descent w/o UE assist Chair to chair: Modified independence Floor:  NT  GAIT: Gait pattern: step through pattern, decreased arm swing- Left, decreased step length- Right, decreased trunk rotation, trunk flexed, and wide BOS Distance walked: 80 Assistive device utilized: Environmental consultant - 4 wheeled and None - 10MWT/balance assessments completed w/o AD Level of assistance: Modified independence Comments: Gait speed = 3.90 ft/sec (decreased from 4.34 ft/sec as of prior discharge from PT)  RAMP: Level of Assistance:  NT Assistive device utilized:  NT Ramp Comments:   CURB:  Level of Assistance:  NT Assistive device utilized:  NT Curb Comments:   STAIRS: (10/20/21)  Level of Assistance: SBA  Stair Negotiation Technique: Alternating Pattern  with Single Rail on  Right  Number of Stairs: 14   Height of Stairs: 7  Comments: foot placement on steps with significant overhang of heel putting him at risk for posterior LOB    FUNCTIONAL TESTS:  5 times sit to stand: 19.32 sec, >16 sec indicates a fall risk for patients with PD Timed up and go (TUG): Normal = 11.06 sec; Manual = 14.04 sec; Cognitive = 16.59 sec (difficulty with cognitive task - Cognitive TUG > 15 sec = high fall risk in community dwelling older adults) 10 meter walk test: 8.41 sec (no AD); Gait speed = 3.90 ft/sec Berg Balance Scale: 42/56; 37-45 = significant risk for falls (>80%)  Functional gait assessment: 17/30; < 19 = high risk fall (10/20/21)  Scores as of discharge from last PT episode - December 2022: 5 times sit to stand: 13.75 sec Timed up and go (TUG): Normal = 9.38 sec; Manual = NT; Cognitive = 11.88 sec 10 meter walk test: 7.56  sec (no AD); Gait speed = 4.34 ft/sec Berg: 53/56; 52-55 lower (> 25%) fall risk Functional gait assessment: 27/30; 25-28 = low risk fall   PATIENT SURVEYS:  ABC scale 1060 / 1600 = 66.3%   TODAY'S TREATMENT:  10/25/21 THERAPEUTIC EXERCISE: to improve flexibility, strength and mobility.  Verbal and tactile cues throughout for technique.  NuStep - L5 x 5 min Seated HS stretch +/- strap for heelcord stretch 2 x 30" bil Seated figure-4 hip hinge piriformis stretch 2 x 30" bil Standing forward lunge hip flexor stretch 2 x 30" bil Seated RTB hip flexion march 10 x 3" Seated alternating RTB hip ABD/ER clam 10 x 3" Sit <> stand with arms extended forward + RTB hip ABD isometric x 5 Seated hip ADD ball squeeze isometric + alternating RTB hip IR reverse clam 10 x 3" Seated RTB resisted DF 10 x 3"    10/20/21 THERAPEUTIC EXERCISE: to improve flexibility, strength and mobility.  Verbal and tactile cues throughout for technique.  NuStep - L4 x 5 min Seated HS stretch +/- strap for heelcord stretch 2 x 30" bil Seated figure-4 hip hinge piriformis  stretch 2 x 30" bil Standing forward lunge hip flexor stretch 2 x 30" bil  THERAPEUTIC ACTIVITIES: FGA = 17/30; < 19 = high risk fall   GAIT TRAINING: To normalize gait pattern and improve safety with rollator . 200 ft with rollator - cues for upright posture, fwd gaze and improved rollator proximity with step placement with heel strike between rear wheel of rollator   10/14/21 Eval only   PATIENT EDUCATION: Education details: PT eval findings and anticipated POC Person educated: Patient Education method: Explanation Education comprehension: verbalized understanding   HOME EXERCISE PROGRAM: Access Code: [email or web address] URL: [email or web address] Date: 10/25/2021 Prepared by: Annie Paras  Exercises - Seated Hamstring Stretch with Strap  - 2 x daily - 7 x weekly - 3 reps - 30 sec hold - Seated Figure 4 Piriformis Stretch  - 2 x daily - 7 x weekly - 3 reps - 30 sec hold - Standing Hip Flexor Stretch  - 2 x daily - 7 x weekly - 2 sets - 10 reps - 30 sec hold - Seated March with Resistance  - 1 x daily - 7 x weekly - 2 sets - 10 reps - 3 sec hold - Seated Isometric Hip Abduction with Resistance  - 1 x daily - 7 x weekly - 2 sets - 10 reps - 3 sec hold - Sit to Stand Without Arm Support  - 1 x daily - 7 x weekly - 2 sets - 10 reps - Seated Ankle Dorsiflexion with Resistance  - 1 x daily - 7 x weekly - 2 sets - 10 reps - 3 sec hold   ASSESSMENT:  CLINICAL IMPRESSION: Nicolette Bang" reports he was unable to attempt the initial HEP stretches since the last PT visit as he has been too busy at home, therefore session initiated with review of HEP stretches following warm-up - Pt able to perform good return demonstration with only minimal clarification. Introduced strengthening exercises targeting LE weakness identified at eval as well as core strengthening - HEP updated at pt request with pt planning to put HEP on his calendar to make it a priority.  OBJECTIVE IMPAIRMENTS:  Abnormal gait, decreased activity tolerance, decreased balance, decreased endurance, decreased mobility, difficulty walking, decreased strength, decreased safety awareness, impaired perceived functional ability, impaired flexibility, improper body mechanics, and postural dysfunction.   ACTIVITY LIMITATIONS: carrying,  lifting, standing, squatting, stairs, transfers, bed mobility, reach over head, locomotion level, and caring for others  PARTICIPATION LIMITATIONS: meal prep, cleaning, laundry, driving, shopping, and community activity  PERSONAL FACTORS: Age, Fitness, Past/current experiences, Time since onset of injury/illness/exacerbation, and 3+ comorbidities: PPM 2 1st degree heart block & SA node dysfunction, possible CVA (ACA infarct), orthostatic hypotension, OSA, COPD, hypothyroidism, chronic PTSD, bipolar I disorder,  decreased energy/endurance since COVID-19 in ~March 2022   are also affecting patient's functional outcome.   REHAB POTENTIAL: Good  CLINICAL DECISION MAKING: Evolving/moderate complexity  EVALUATION COMPLEXITY: Moderate   GOALS: Goals reviewed with patient? Yes  SHORT TERM GOALS: Target date: 11/04/2021   Patient will be independent with initial HEP. Baseline:  Goal status: IN PROGRESS  2.  Patient will demonstrate decreased fall risk by scoring </= 15 sec on cognitive TUG. Baseline: 16.59 sec Goal status: IN PROGRESS  3.  Patient will be educated on strategies to decrease risk of falls.  Baseline:  Goal status: IN PROGRESS  LONG TERM GOALS: Target date: 11/25/2021   Patient will be independent with ongoing/advanced HEP for self-management at home incorporating PWR! Moves as indicated .  Baseline:  Goal status: IN PROGRESS  2.  Patient will ambulate at least 1000 ft with LRAD including outdoor and unlevel surfaces, independently with no LOB, for improved community gait. Baseline:  Goal status: IN PROGRESS  3.  Patient will be able to step up/down curb  safely with LRAD for safety with community ambulation.  Baseline:  Goal status: IN PROGRESS   4.  Patient will improve 5x STS time to </= 16 seconds to demonstrate improved functional strength and transfer efficiency. Baseline: 19.32 sec Goal status: IN PROGRESS  5.  Patient will demonstrate at least 25/30 on FGA to improve gait stability and reduce risk for falls. (MCID = 4 points) Baseline: TBD Goal status: IN PROGRESS  6.  Patient will improve Berg score to >/= 52/56 to improve safety and stability with ADLs in standing and reduce risk for falls. (MCID = 8 points)   Baseline: 42/56 Goal status: IN PROGRESS  7.  Patient will report >/= 80% on ABC scale to demonstrate improved balance confidence and decreased risk for falls. Baseline: 1060 / 1600 = 66.3% Goal status: IN PROGRESS  8. Patient will verbalize understanding of local Parkinson's disease community resources, including community fitness post d/c. Baseline:  Goal status: IN PROGRESS   PLAN: PT FREQUENCY: 2x/week  PT DURATION: 6 weeks  PLANNED INTERVENTIONS: Therapeutic exercises, Therapeutic activity, Neuromuscular re-education, Balance training, Gait training, Patient/Family education, Self Care, Joint mobilization, Stair training, DME instructions, Manual therapy, and Re-evaluation  PLAN FOR NEXT SESSION: review HEP as indicated; progress strengthening; introduce PWR! Moves; review rollator gait safety    Percival Spanish, PT 10/25/2021, 11:54 AM

## 2021-10-28 ENCOUNTER — Ambulatory Visit: Payer: Medicare Other | Admitting: Physical Therapy

## 2021-10-28 ENCOUNTER — Encounter: Payer: Self-pay | Admitting: Physical Therapy

## 2021-10-28 DIAGNOSIS — M6281 Muscle weakness (generalized): Secondary | ICD-10-CM

## 2021-10-28 DIAGNOSIS — R2681 Unsteadiness on feet: Secondary | ICD-10-CM

## 2021-10-28 DIAGNOSIS — R293 Abnormal posture: Secondary | ICD-10-CM

## 2021-10-28 DIAGNOSIS — R2689 Other abnormalities of gait and mobility: Secondary | ICD-10-CM

## 2021-10-28 NOTE — Therapy (Signed)
OUTPATIENT PHYSICAL THERAPY TREATMENT   Patient Name: Vada Yellen MRN: [id number] DOB:01-Nov-1932, 88 y.o., male Today's Date: 10/28/2021    PT End of Session - 10/28/21 1014     Visit Number 4    Date for PT Re-Evaluation 11/25/21    Authorization Type Medicare & [employer]    PT Start Time 1014    PT Stop Time 1100    PT Time Calculation (min) 46 min    Activity Tolerance Patient tolerated treatment well    Behavior During Therapy WFL for tasks assessed/performed              Past Medical History:  Diagnosis Date   Bipolar 1 disorder ([place])    COPD (chronic obstructive pulmonary disease) ([place])    Depression    First degree AV block    Hypertension    pt denies but was in medical record   Hypoglycemia, unspecified    Myalgia and myositis, unspecified    Obesity    Pneumonia 1970   Skin cancer (melanoma) ([place])    Thyroid disease    Unspecified hypothyroidism    UPJ obstruction, congenital    Past Surgical History:  Procedure Laterality Date   APPENDECTOMY  1939   malignant melanoma removed from right forehead  2008   PERMANENT PACEMAKER INSERTION N/A 06/19/2013   Procedure: PERMANENT PACEMAKER INSERTION;  Surgeon: Evans Lance, MD;  Location: [facility] CATH LAB;  Service: Cardiovascular;  Laterality: N/A;   right big toe  surgery  2009   TRANSURETHRAL RESECTION OF PROSTATE  02/04/2011   Procedure: TRANSURETHRAL RESECTION OF THE PROSTATE (TURP);  Surgeon: Dutch Gray, MD;  Location: [facility] ORS;  Service: Urology;  Laterality: N/A;   URETHRA SURGERY  2010   reconstruction   Patient Active Problem List   Diagnosis Date Noted   History of neoplasm 04/12/2021   Melanocytic nevi of trunk 04/12/2021   Other seborrheic keratosis 04/12/2021   Cerebral embolism with cerebral infarction 03/31/2021   Left leg weakness 03/30/2021   Chronic post-traumatic stress disorder 12/05/2017   Bipolar I disorder, current or most recent episode depressed, in partial remission  ([place]) 12/05/2017   CHB (complete heart block) ([place]) 01/27/2017   HTN (hypertension) 01/27/2017   Hypothyroidism 10/11/2016   Parkinson disease 09/08/2016   Pacemaker 09/24/2013   SSS (sick sinus syndrome) ([place]) 06/19/2013   First degree AV block 04/09/2013   Allergic rhinitis 03/17/2013   Obstructive sleep apnea 12/18/2006   COPD mixed type ([place]) 12/18/2006   History of malignant melanoma of skin 12/18/2006   Diabetes mellitus ([place]) 12/18/2006    PCP: [name], FNP   REFERRING PROVIDER: Ludwig Clarks, DO   REFERRING DIAG: G20 (ICD-10-CM) - Parkinson disease ([place])  THERAPY DIAG:  Other abnormalities of gait and mobility  Unsteadiness on feet  Muscle weakness (generalized)  Abnormal posture  RATIONALE FOR EVALUATION AND TREATMENT: Rehabilitation  ONSET DATE: Initial PD diagnosis ~16 yrs ago  NEXT MD VISIT: 04/07/2022   SUBJECTIVE:      SUBJECTIVE STATEMENT: Pt reports the HEP stretches are going okay. Pt brought his booklet "Fitness Counts: A Body Guide to Parkinson's Disease" and states he tries to complete most of the exercises in the booklet.  PAIN:  Are you having pain? No  PERTINENT HISTORY:  PPM 2 1st degree heart block & SA node dysfunction, possible CVA (ACA infarct), orthostatic hypotension, OSA, COPD, hypothyroidism, chronic PTSD, bipolar I disorder,  decreased energy/endurance since COVID-19 in ~March 2022  PRECAUTIONS: Fall and ICD/Pacemaker  WEIGHT BEARING RESTRICTIONS No  FALLS: Has patient fallen in last 6 months? No  LIVING ENVIRONMENT: Lives with: lives alone and with his dog Lives in: [facility] (3rd [facility]) Stairs:  Elevator Has following equipment at home: Single point cane and hiking pole  OCCUPATION: Retired  PLOF: Independent and Leisure: being outdoors - walking his dog, and stays active most of the day     PATIENT GOALS: "Gain more strength in my legs and gain more balance overall."   OBJECTIVE:    DIAGNOSTIC FINDINGS:  N/A  COGNITION: Overall cognitive status: Within functional limits for tasks assessed   SENSATION: WFL  COORDINATION: WFL for LE  MUSCLE TONE: WNL  MUSCLE LENGTH: Hamstrings: mod/severe tight B ITB: mod tight B Piriformis: mod tight B Hip IR: mod/severe tight B Hip flexors: mod/severe tight B Quads: mod tight R>L Heelcord: NT  POSTURE:  rounded shoulders, forward head, increased thoracic kyphosis, and flexed trunk   LOWER EXTREMITY ROM:    WFL other than limitations due to muscle tightness as indicated   LOWER EXTREMITY MMT:    MMT Right Eval Left Eval  Hip flexion 4+ 4  Hip extension 4 Limited ROM 4+ Limited ROM  Hip abduction 4+ 4+  Hip adduction 4+ 4  Hip internal rotation 4+ 4+  Hip external rotation 4+ 4  Knee flexion 5 5  Knee extension 5 5  Ankle dorsiflexion 4+ 4  Ankle plantarflexion 5 5-  Ankle inversion    Ankle eversion    (Blank rows = not tested)  BED MOBILITY:  Sit to supine SBA Supine to sit SBA Rolling to Right SBA Rolling to Left SBA  TRANSFERS: Assistive device utilized: None  Sit to stand: Complete Independence and Modified independence - UE assist necessary from lower surface height Stand to sit: Modified independence - uncontrolled descent w/o UE assist Chair to chair: Modified independence Floor:  NT  GAIT: Gait pattern: step through pattern, decreased arm swing- Left, decreased step length- Right, decreased trunk rotation, trunk flexed, and wide BOS Distance walked: 80 Assistive device utilized: Environmental consultant - 4 wheeled and None - 10MWT/balance assessments completed w/o AD Level of assistance: Modified independence Comments: Gait speed = 3.90 ft/sec (decreased from 4.34 ft/sec as of prior discharge from PT)  RAMP: Level of Assistance:  NT Assistive device utilized:  NT Ramp Comments:   CURB:  Level of Assistance:  NT Assistive device utilized:  NT Curb Comments:   STAIRS: (10/20/21)  Level of  Assistance: SBA  Stair Negotiation Technique: Alternating Pattern  with Single Rail on Right  Number of Stairs: 14   Height of Stairs: 7  Comments: foot placement on steps with significant overhang of heel putting him at risk for posterior LOB    FUNCTIONAL TESTS:  5 times sit to stand: 19.32 sec, >16 sec indicates a fall risk for patients with PD Timed up and go (TUG): Normal = 11.06 sec; Manual = 14.04 sec; Cognitive = 16.59 sec (difficulty with cognitive task - Cognitive TUG > 15 sec = high fall risk in community dwelling older adults) 10 meter walk test: 8.41 sec (no AD); Gait speed = 3.90 ft/sec Berg Balance Scale: 42/56; 37-45 = significant risk for falls (>80%)  Functional gait assessment: 17/30; < 19 = high risk fall (10/20/21)  Scores as of discharge from last PT episode - December 2022: 5 times sit to stand: 13.75 sec Timed up and go (TUG): Normal = 9.38 sec; Manual =  NT; Cognitive = 11.88 sec 10 meter walk test: 7.56 sec (no AD); Gait speed = 4.34 ft/sec Berg: 53/56; 52-55 lower (> 25%) fall risk Functional gait assessment: 27/30; 25-28 = low risk fall   PATIENT SURVEYS:  ABC scale 1060 / 1600 = 66.3%   TODAY'S TREATMENT:  10/28/21 THERAPEUTIC EXERCISE: to improve flexibility, strength and mobility.  Verbal and tactile cues throughout for technique.  NuStep - L5 x 5 min Seated alternating RTB hip ABD/ER clam 10 x 3" Seated RTB hip flexion march 10 x 3" Seated hip ADD ball squeeze isometric + alternating RTB hip IR reverse clam 10 x 3" Seated RTB resisted DF 10 x 3"   NEUROMUSCULAR RE-EDUCATION: To improve posture, balance, coordination, reduce fall risk, amplitude of movement, speed of movement to reduce bradykinesia, and reduce rigidity. Seated PWR! Moves (Up, Rock, Twist, Step) x 10 each PWR! Sit to Stand 2 x 5  Standing PWR! Moves (Up, Bennett, Twist, Step) x 10 each    10/25/21 THERAPEUTIC EXERCISE: to improve flexibility, strength and mobility.  Verbal and  tactile cues throughout for technique.  NuStep - L5 x 5 min Seated HS stretch +/- strap for heelcord stretch 2 x 30" bil Seated figure-4 hip hinge piriformis stretch 2 x 30" bil Standing forward lunge hip flexor stretch 2 x 30" bil Seated RTB hip flexion march 10 x 3" Seated alternating RTB hip ABD/ER clam 10 x 3" Sit <> stand with arms extended forward + RTB hip ABD isometric x 5 Seated hip ADD ball squeeze isometric + alternating RTB hip IR reverse clam 10 x 3" Seated RTB resisted DF 10 x 3"   10/20/21 THERAPEUTIC EXERCISE: to improve flexibility, strength and mobility.  Verbal and tactile cues throughout for technique.  NuStep - L4 x 5 min Seated HS stretch +/- strap for heelcord stretch 2 x 30" bil Seated figure-4 hip hinge piriformis stretch 2 x 30" bil Standing forward lunge hip flexor stretch 2 x 30" bil  THERAPEUTIC ACTIVITIES: FGA = 17/30; < 19 = high risk fall   GAIT TRAINING: To normalize gait pattern and improve safety with rollator . 200 ft with rollator - cues for upright posture, fwd gaze and improved rollator proximity with step placement with heel strike between rear wheel of rollator   PATIENT EDUCATION: Education details: PWR! Moves - seated & standing Person educated: Patient Education method: Explanation, Demonstration, and Verbal cues Education comprehension: verbalized understanding and returned demonstration   HOME EXERCISE PROGRAM: Access Code: [email or web address] URL: [email or web address] Date: 10/25/2021 Prepared by: Annie Paras  Exercises - Seated Hamstring Stretch with Strap  - 2 x daily - 7 x weekly - 3 reps - 30 sec hold - Seated Figure 4 Piriformis Stretch  - 2 x daily - 7 x weekly - 3 reps - 30 sec hold - Standing Hip Flexor Stretch  - 2 x daily - 7 x weekly - 2 sets - 10 reps - 30 sec hold - Seated March with Resistance  - 1 x daily - 7 x weekly - 2 sets - 10 reps - 3 sec hold - Seated Isometric Hip Abduction with Resistance  - 1 x  daily - 7 x weekly - 2 sets - 10 reps - 3 sec hold - Sit to Stand Without Arm Support  - 1 x daily - 7 x weekly - 2 sets - 10 reps - Seated Ankle Dorsiflexion with Resistance  - 1 x daily - 7 x weekly - 2 sets - 10  reps - 3 sec hold  PWR! Moves:  - Seated - Standing  ASSESSMENT:  CLINICAL IMPRESSION: Davidmichael Zarazua" states he was able to work on the [employer] and reports no issues. We initiated review of the PWR! Moves today, starting with seated and standing moves with pt demonstrating ability to pick up patterns fairly quick but noting some feeling of restricted movement. Strengthening addition from last visit reviewed with minor clarifications necessary for movement patterns and pacing but otherwise well tolerated.  OBJECTIVE IMPAIRMENTS: Abnormal gait, decreased activity tolerance, decreased balance, decreased endurance, decreased mobility, difficulty walking, decreased strength, decreased safety awareness, impaired perceived functional ability, impaired flexibility, improper body mechanics, and postural dysfunction.   ACTIVITY LIMITATIONS: carrying, lifting, standing, squatting, stairs, transfers, bed mobility, reach over head, locomotion level, and caring for others  PARTICIPATION LIMITATIONS: meal prep, cleaning, laundry, driving, shopping, and community activity  PERSONAL FACTORS: Age, Fitness, Past/current experiences, Time since onset of injury/illness/exacerbation, and 3+ comorbidities: PPM 2 1st degree heart block & SA node dysfunction, possible CVA (ACA infarct), orthostatic hypotension, OSA, COPD, hypothyroidism, chronic PTSD, bipolar I disorder,  decreased energy/endurance since COVID-19 in ~March 2022   are also affecting patient's functional outcome.   REHAB POTENTIAL: Good  CLINICAL DECISION MAKING: Evolving/moderate complexity  EVALUATION COMPLEXITY: Moderate   GOALS: Goals reviewed with patient? Yes  SHORT TERM GOALS: Target date: 11/04/2021   Patient will be  independent with initial HEP. Baseline:  Goal status: IN PROGRESS  2.  Patient will demonstrate decreased fall risk by scoring </= 15 sec on cognitive TUG. Baseline: 16.59 sec Goal status: IN PROGRESS  3.  Patient will be educated on strategies to decrease risk of falls.  Baseline:  Goal status: IN PROGRESS  LONG TERM GOALS: Target date: 11/25/2021   Patient will be independent with ongoing/advanced HEP for self-management at home incorporating PWR! Moves as indicated .  Baseline:  Goal status: IN PROGRESS  2.  Patient will ambulate at least 1000 ft with LRAD including outdoor and unlevel surfaces, independently with no LOB, for improved community gait. Baseline:  Goal status: IN PROGRESS  3.  Patient will be able to step up/down curb safely with LRAD for safety with community ambulation.  Baseline:  Goal status: IN PROGRESS   4.  Patient will improve 5x STS time to </= 16 seconds to demonstrate improved functional strength and transfer efficiency. Baseline: 19.32 sec Goal status: IN PROGRESS  5.  Patient will demonstrate at least 25/30 on FGA to improve gait stability and reduce risk for falls. (MCID = 4 points) Baseline: TBD Goal status: IN PROGRESS  6.  Patient will improve Berg score to >/= 52/56 to improve safety and stability with ADLs in standing and reduce risk for falls. (MCID = 8 points)   Baseline: 42/56 Goal status: IN PROGRESS  7.  Patient will report >/= 80% on ABC scale to demonstrate improved balance confidence and decreased risk for falls. Baseline: 1060 / 1600 = 66.3% Goal status: IN PROGRESS  8. Patient will verbalize understanding of local Parkinson's disease community resources, including community fitness post d/c. Baseline:  Goal status: IN PROGRESS   PLAN: PT FREQUENCY: 2x/week  PT DURATION: 6 weeks  PLANNED INTERVENTIONS: Therapeutic exercises, Therapeutic activity, Neuromuscular re-education, Balance training, Gait training, Patient/Family  education, Self Care, Joint mobilization, Stair training, DME instructions, Manual therapy, and Re-evaluation  PLAN FOR NEXT SESSION: STG assessment; review HEP as indicated; progress strengthening; review and progress PWR! Moves; review rollator gait safety    Naveyah Iacovelli  Nolon Rod, PT 10/28/2021, 6:21 PM

## 2021-11-02 ENCOUNTER — Encounter: Payer: Self-pay | Admitting: Physical Therapy

## 2021-11-02 ENCOUNTER — Ambulatory Visit: Payer: Medicare Other | Admitting: Physical Therapy

## 2021-11-02 DIAGNOSIS — M6281 Muscle weakness (generalized): Secondary | ICD-10-CM

## 2021-11-02 DIAGNOSIS — R2689 Other abnormalities of gait and mobility: Secondary | ICD-10-CM

## 2021-11-02 DIAGNOSIS — R293 Abnormal posture: Secondary | ICD-10-CM

## 2021-11-02 DIAGNOSIS — R2681 Unsteadiness on feet: Secondary | ICD-10-CM

## 2021-11-02 NOTE — Therapy (Signed)
OUTPATIENT PHYSICAL THERAPY TREATMENT   Patient Name: Victor Osborne MRN: [id number] DOB:10-May-1932, 88 y.o., male Today's Date: 11/02/2021    PT End of Session - 11/02/21 1015     Visit Number 5    Date for PT Re-Evaluation 11/25/21    Authorization Type Medicare & [employer]    PT Start Time 1324    PT Stop Time 1058    PT Time Calculation (min) 43 min    Activity Tolerance Patient tolerated treatment well    Behavior During Therapy WFL for tasks assessed/performed               Past Medical History:  Diagnosis Date   Bipolar 1 disorder ([place])    COPD (chronic obstructive pulmonary disease) ([place])    Depression    First degree AV block    Hypertension    pt denies but was in medical record   Hypoglycemia, unspecified    Myalgia and myositis, unspecified    Obesity    Pneumonia 1970   Skin cancer (melanoma) ([place])    Thyroid disease    Unspecified hypothyroidism    UPJ obstruction, congenital    Past Surgical History:  Procedure Laterality Date   APPENDECTOMY  1939   malignant melanoma removed from right forehead  2008   PERMANENT PACEMAKER INSERTION N/A 06/19/2013   Procedure: PERMANENT PACEMAKER INSERTION;  Surgeon: Evans Lance, MD;  Location: [facility] CATH LAB;  Service: Cardiovascular;  Laterality: N/A;   right big toe  surgery  2009   TRANSURETHRAL RESECTION OF PROSTATE  02/04/2011   Procedure: TRANSURETHRAL RESECTION OF THE PROSTATE (TURP);  Surgeon: Dutch Gray, MD;  Location: [facility];  Service: Urology;  Laterality: N/A;   URETHRA SURGERY  2010   reconstruction   Patient Active Problem List   Diagnosis Date Noted   History of neoplasm 04/12/2021   Melanocytic nevi of trunk 04/12/2021   Other seborrheic keratosis 04/12/2021   Cerebral embolism with cerebral infarction 03/31/2021   Left leg weakness 03/30/2021   Chronic post-traumatic stress disorder 12/05/2017   Bipolar I disorder, current or most recent episode depressed, in partial remission  ([place]) 12/05/2017   CHB (complete heart block) ([place]) 01/27/2017   HTN (hypertension) 01/27/2017   Hypothyroidism 10/11/2016   Parkinson disease 09/08/2016   Pacemaker 09/24/2013   SSS (sick sinus syndrome) ([place]) 06/19/2013   First degree AV block 04/09/2013   Allergic rhinitis 03/17/2013   Obstructive sleep apnea 12/18/2006   COPD mixed type ([place]) 12/18/2006   History of malignant melanoma of skin 12/18/2006   Diabetes mellitus ([place]) 12/18/2006    PCP: [name], FNP   REFERRING PROVIDER: Ludwig Clarks, DO   REFERRING DIAG: G20 (ICD-10-CM) - Parkinson disease ([place])  THERAPY DIAG:  Other abnormalities of gait and mobility  Unsteadiness on feet  Muscle weakness (generalized)  Abnormal posture  RATIONALE FOR EVALUATION AND TREATMENT: Rehabilitation  ONSET DATE: Initial PD diagnosis ~16 yrs ago  NEXT MD VISIT: 04/07/2022   SUBJECTIVE:      SUBJECTIVE STATEMENT: Pt reports he felt good after going over the PWR! Moves last visit.  PAIN:  Are you having pain? No  PERTINENT HISTORY:  PPM 2 1st degree heart block & SA node dysfunction, possible CVA (ACA infarct), orthostatic hypotension, OSA, COPD, hypothyroidism, chronic PTSD, bipolar I disorder,  decreased energy/endurance since COVID-19 in ~March 2022    PRECAUTIONS: Fall and ICD/Pacemaker  WEIGHT BEARING RESTRICTIONS No  FALLS: Has patient fallen in last  6 months? No  LIVING ENVIRONMENT: Lives with: lives alone and with his dog Lives in: [facility] (3rd floor) Stairs:  Elevator Has following equipment at home: Single point cane and hiking pole  OCCUPATION: Retired  PLOF: Independent and Leisure: being outdoors - walking his dog, and stays active most of the day     PATIENT GOALS: "Gain more strength in my legs and gain more balance overall."   OBJECTIVE:   DIAGNOSTIC FINDINGS:  N/A  COGNITION: Overall cognitive status: Within functional limits for tasks  assessed   SENSATION: WFL  COORDINATION: WFL for LE  MUSCLE TONE: WNL  MUSCLE LENGTH: Hamstrings: mod/severe tight B ITB: mod tight B Piriformis: mod tight B Hip IR: mod/severe tight B Hip flexors: mod/severe tight B Quads: mod tight R>L Heelcord: NT  POSTURE:  rounded shoulders, forward head, increased thoracic kyphosis, and flexed trunk   LOWER EXTREMITY ROM:    WFL other than limitations due to muscle tightness as indicated   LOWER EXTREMITY MMT:    MMT Right Eval Left Eval  Hip flexion 4+ 4  Hip extension 4 Limited ROM 4+ Limited ROM  Hip abduction 4+ 4+  Hip adduction 4+ 4  Hip internal rotation 4+ 4+  Hip external rotation 4+ 4  Knee flexion 5 5  Knee extension 5 5  Ankle dorsiflexion 4+ 4  Ankle plantarflexion 5 5-  Ankle inversion    Ankle eversion    (Blank rows = not tested)  BED MOBILITY:  Sit to supine SBA Supine to sit SBA Rolling to Right SBA Rolling to Left SBA  TRANSFERS: Assistive device utilized: None  Sit to stand: Complete Independence and Modified independence - UE assist necessary from lower surface height Stand to sit: Modified independence - uncontrolled descent w/o UE assist Chair to chair: Modified independence Floor:  NT  GAIT: Gait pattern: step through pattern, decreased arm swing- Left, decreased step length- Right, decreased trunk rotation, trunk flexed, and wide BOS Distance walked: 80 Assistive device utilized: Environmental consultant - 4 wheeled and None - 10MWT/balance assessments completed w/o AD Level of assistance: Modified independence Comments: Gait speed = 3.90 ft/sec (decreased from 4.34 ft/sec as of prior discharge from PT)  RAMP: Level of Assistance:  NT Assistive device utilized:  NT Ramp Comments:   CURB:  Level of Assistance:  NT Assistive device utilized:  NT Curb Comments:   STAIRS: (10/20/21)  Level of Assistance: SBA  Stair Negotiation Technique: Alternating Pattern  with Single Rail on Right  Number of  Stairs: 14   Height of Stairs: 7  Comments: foot placement on steps with significant overhang of heel putting him at risk for posterior LOB    FUNCTIONAL TESTS:  5 times sit to stand: 19.32 sec, >16 sec indicates a fall risk for patients with PD Timed up and go (TUG): Normal = 11.06 sec; Manual = 14.04 sec; Cognitive = 16.59 sec (difficulty with cognitive task - Cognitive TUG > 15 sec = high fall risk in community dwelling older adults) 10 meter walk test: 8.41 sec (no AD); Gait speed = 3.90 ft/sec Berg Balance Scale: 42/56; 37-45 = significant risk for falls (>80%)  Functional gait assessment: 17/30; < 19 = high risk fall (10/20/21)  Scores as of discharge from last PT episode - December 2022: 5 times sit to stand: 13.75 sec Timed up and go (TUG): Normal = 9.38 sec; Manual = NT; Cognitive = 11.88 sec 10 meter walk test: 7.56 sec (no AD); Gait speed =  4.34 ft/sec Berg: 53/56; 52-55 lower (> 25%) fall risk Functional gait assessment: 27/30; 25-28 = low risk fall   PATIENT SURVEYS:  ABC scale 1060 / 1600 = 66.3%   TODAY'S TREATMENT:  11/02/21 THERAPEUTIC EXERCISE: to improve flexibility, strength and mobility.  Verbal and tactile cues throughout for technique.  Rec Bike - L3 x 5 min Seated lunge position hip flexor/quad stretch 2 x 30" Standing RTB 4-way SLR x 10 bil; 2 pole A for balance + SBA of PT Standing GTB row in staggered stance 2 x 10 Standing GTB scap retraction + shoulder extension to neutral in staggered stance 2 x 10; cues for upright posture   10/28/21 THERAPEUTIC EXERCISE: to improve flexibility, strength and mobility.  Verbal and tactile cues throughout for technique.  NuStep - L5 x 5 min Seated alternating RTB hip ABD/ER clam 10 x 3" Seated RTB hip flexion march 10 x 3" Seated hip ADD ball squeeze isometric + alternating RTB hip IR reverse clam 10 x 3" Seated RTB resisted DF 10 x 3"   NEUROMUSCULAR RE-EDUCATION: To improve posture, balance, coordination, reduce  fall risk, amplitude of movement, speed of movement to reduce bradykinesia, and reduce rigidity. Seated PWR! Moves (Up, Rock, Twist, Step) x 10 each PWR! Sit to Stand 2 x 5  Standing PWR! Moves (Up, [place], Twist, Step) x 10 each   10/25/21 THERAPEUTIC EXERCISE: to improve flexibility, strength and mobility.  Verbal and tactile cues throughout for technique.  NuStep - L5 x 5 min Seated HS stretch +/- strap for heelcord stretch 2 x 30" bil Seated figure-4 hip hinge piriformis stretch 2 x 30" bil Standing forward lunge hip flexor stretch 2 x 30" bil Seated RTB hip flexion march 10 x 3" Seated alternating RTB hip ABD/ER clam 10 x 3" Sit <> stand with arms extended forward + RTB hip ABD isometric x 5 Seated hip ADD ball squeeze isometric + alternating RTB hip IR reverse clam 10 x 3" Seated RTB resisted DF 10 x 3"   PATIENT EDUCATION: Education details: PWR! Moves - seated & standing Person educated: Patient Education method: Explanation, Demonstration, and Verbal cues Education comprehension: verbalized understanding and returned demonstration   HOME EXERCISE PROGRAM: Access Code: [email or web address] URL: [email or web address] Date: 10/25/2021 Prepared by: Annie Paras  Exercises - Seated Hamstring Stretch with Strap  - 2 x daily - 7 x weekly - 3 reps - 30 sec hold - Seated Figure 4 Piriformis Stretch  - 2 x daily - 7 x weekly - 3 reps - 30 sec hold - Standing Hip Flexor Stretch  - 2 x daily - 7 x weekly - 2 sets - 10 reps - 30 sec hold - Seated March with Resistance  - 1 x daily - 7 x weekly - 2 sets - 10 reps - 3 sec hold - Seated Isometric Hip Abduction with Resistance  - 1 x daily - 7 x weekly - 2 sets - 10 reps - 3 sec hold - Sit to Stand Without Arm Support  - 1 x daily - 7 x weekly - 2 sets - 10 reps - Seated Ankle Dorsiflexion with Resistance  - 1 x daily - 7 x weekly - 2 sets - 10 reps - 3 sec hold  PWR! Moves:  - Seated - Standing  ASSESSMENT:  CLINICAL  IMPRESSION: Nicolette Bang" notes some tightness in his quads during the warm-up on the recumbent bike, therefore provided instruction in seated quad/hip flexor stretch. He expressed desire to focus on  strengthening and balance today, therefore targeted SLS balance with RTB 4-way SLR as well as postural and core muscle activation with rows/retraction in staggered stance. Pt noting more fatigue today but admitting to skipping breakfast - encouraged pt to eat before coming to PT to exercise to provide adequate fuel for exercises  OBJECTIVE IMPAIRMENTS: Abnormal gait, decreased activity tolerance, decreased balance, decreased endurance, decreased mobility, difficulty walking, decreased strength, decreased safety awareness, impaired perceived functional ability, impaired flexibility, improper body mechanics, and postural dysfunction.   ACTIVITY LIMITATIONS: carrying, lifting, standing, squatting, stairs, transfers, bed mobility, reach over head, locomotion level, and caring for others  PARTICIPATION LIMITATIONS: meal prep, cleaning, laundry, driving, shopping, and community activity  PERSONAL FACTORS: Age, Fitness, Past/current experiences, Time since onset of injury/illness/exacerbation, and 3+ comorbidities: PPM 2 1st degree heart block & SA node dysfunction, possible CVA (ACA infarct), orthostatic hypotension, OSA, COPD, hypothyroidism, chronic PTSD, bipolar I disorder,  decreased energy/endurance since COVID-19 in ~March 2022   are also affecting patient's functional outcome.   REHAB POTENTIAL: Good  CLINICAL DECISION MAKING: Evolving/moderate complexity  EVALUATION COMPLEXITY: Moderate   GOALS: Goals reviewed with patient? Yes  SHORT TERM GOALS: Target date: 11/04/2021   Patient will be independent with initial HEP. Baseline:  Goal status: MET  11/02/21  2.  Patient will demonstrate decreased fall risk by scoring </= 15 sec on cognitive TUG. Baseline: 16.59 sec Goal status: IN  PROGRESS  3.  Patient will be educated on strategies to decrease risk of falls.  Baseline:  Goal status: IN PROGRESS  LONG TERM GOALS: Target date: 11/25/2021   Patient will be independent with ongoing/advanced HEP for self-management at home incorporating PWR! Moves as indicated .  Baseline:  Goal status: IN PROGRESS  2.  Patient will ambulate at least 1000 ft with LRAD including outdoor and unlevel surfaces, independently with no LOB, for improved community gait. Baseline:  Goal status: IN PROGRESS  3.  Patient will be able to step up/down curb safely with LRAD for safety with community ambulation.  Baseline:  Goal status: IN PROGRESS   4.  Patient will improve 5x STS time to </= 16 seconds to demonstrate improved functional strength and transfer efficiency. Baseline: 19.32 sec Goal status: IN PROGRESS  5.  Patient will demonstrate at least 25/30 on FGA to improve gait stability and reduce risk for falls. (MCID = 4 points) Baseline: TBD Goal status: IN PROGRESS  6.  Patient will improve Berg score to >/= 52/56 to improve safety and stability with ADLs in standing and reduce risk for falls. (MCID = 8 points)   Baseline: 42/56 Goal status: IN PROGRESS  7.  Patient will report >/= 80% on ABC scale to demonstrate improved balance confidence and decreased risk for falls. Baseline: 1060 / 1600 = 66.3% Goal status: IN PROGRESS  8. Patient will verbalize understanding of local Parkinson's disease community resources, including community fitness post d/c. Baseline:  Goal status: IN PROGRESS   PLAN: PT FREQUENCY: 2x/week  PT DURATION: 6 weeks  PLANNED INTERVENTIONS: Therapeutic exercises, Therapeutic activity, Neuromuscular re-education, Balance training, Gait training, Patient/Family education, Self Care, Joint mobilization, Stair training, DME instructions, Manual therapy, and Re-evaluation  PLAN FOR NEXT SESSION: STG assessment; review HEP as indicated; progress  strengthening; review and progress PWR! Moves; review rollator gait safety    Percival Spanish, PT 11/02/2021, 12:27 PM

## 2021-11-04 ENCOUNTER — Ambulatory Visit (INDEPENDENT_AMBULATORY_CARE_PROVIDER_SITE_OTHER): Payer: Medicare Other | Admitting: Family

## 2021-11-04 ENCOUNTER — Encounter: Payer: Self-pay | Admitting: Family

## 2021-11-04 VITALS — BP 138/86 | HR 63 | Temp 97.6°F | Ht 76.0 in | Wt 202.0 lb

## 2021-11-04 DIAGNOSIS — G20A1 Parkinson's disease without dyskinesia, without mention of fluctuations: Secondary | ICD-10-CM | POA: Diagnosis not present

## 2021-11-04 DIAGNOSIS — F3175 Bipolar disorder, in partial remission, most recent episode depressed: Secondary | ICD-10-CM | POA: Diagnosis not present

## 2021-11-04 DIAGNOSIS — E032 Hypothyroidism due to medicaments and other exogenous substances: Secondary | ICD-10-CM

## 2021-11-04 DIAGNOSIS — J449 Chronic obstructive pulmonary disease, unspecified: Secondary | ICD-10-CM | POA: Diagnosis not present

## 2021-11-04 MED ORDER — LEVOTHYROXINE SODIUM 125 MCG PO TABS
125.0000 ug | ORAL_TABLET | Freq: Every day | ORAL | 3 refills | Status: DC
Start: 2021-11-04 — End: 2022-04-25

## 2021-11-04 NOTE — Progress Notes (Signed)
Victor Osborne is a 88 y.o. male with the following history as recorded in EpicCare:  Patient Active Problem List   Diagnosis Date Noted   History of neoplasm 04/12/2021   Melanocytic nevi of trunk 04/12/2021   Other seborrheic keratosis 04/12/2021   Cerebral embolism with cerebral infarction 03/31/2021   Left leg weakness 03/30/2021   Chronic post-traumatic stress disorder 12/05/2017   Bipolar I disorder, current or most recent episode depressed, in partial remission ([place]) 12/05/2017   CHB (complete heart block) ([place]) 01/27/2017   HTN (hypertension) 01/27/2017   Hypothyroidism 10/11/2016   Parkinson disease 09/08/2016   Pacemaker 09/24/2013   SSS (sick sinus syndrome) ([place]) 06/19/2013   First degree AV block 04/09/2013   Allergic rhinitis 03/17/2013   Obstructive sleep apnea 12/18/2006   COPD mixed type ([place]) 12/18/2006   History of malignant melanoma of skin 12/18/2006   Diabetes mellitus ([place]) 12/18/2006    Current Outpatient Medications  Medication Sig Dispense Refill   acetaminophen (TYLENOL) 325 MG tablet Take 650 mg by mouth every 6 (six) hours as needed.     alclomethasone (ACLOVATE) 0.05 % ointment Apply 1 application topically 2 (two) times daily as needed (for rash).     aspirin 325 MG EC tablet Take 1 tablet (325 mg total) by mouth daily. 90 tablet 3   Bee Pollen 550 MG CAPS Take 550 mg by mouth daily.     carbamazepine (TEGRETOL) 200 MG tablet Take 1 tablet (200 mg total) by mouth 3 (three) times daily. 270 tablet 1   carbidopa-levodopa (SINEMET IR) 25-100 MG tablet Take 2 tablets by mouth 3 (three) times daily. 540 tablet 1   cholecalciferol (VITAMIN D) 1000 UNITS tablet Take 2,000 Units by mouth daily.     Coenzyme Q10 (CO Q 10 PO) Take 1 capsule by mouth daily.     desoximetasone (TOPICORT) 0.25 % cream Apply 1 application topically 2 (two) times daily.     ferrous sulfate 324 (65 Fe) MG TBEC Take 324 mg by mouth.     Flaxseed, Linseed, (FLAX SEED OIL) 1300  MG CAPS Take 1 capsule by mouth daily.     FLUoxetine (PROZAC) 10 MG capsule Take 1 capsule (10 mg total) by mouth daily. 90 capsule 1   levothyroxine (LEVOTHROID) 125 MCG tablet Take 1 tablet (125 mcg total) by mouth daily before breakfast. 90 tablet 3   MAGNESIUM PO Take 1 capsule by mouth daily.     Multiple Vitamin (MULITIVITAMIN WITH MINERALS) TABS Take 1 tablet by mouth daily.     Omega-3 Fatty Acids (OMEGA-3 CF PO) Take 1,200 mg by mouth daily.     POTASSIUM GLUCONATE PO Take 1 capsule by mouth daily.     pravastatin (PRAVACHOL) 80 MG tablet Take 1 tablet (80 mg total) by mouth daily. 90 tablet 1   QUEtiapine (SEROQUEL XR) 50 MG TB24 24 hr tablet Take 1 tablet (50 mg total) by mouth at bedtime. 90 tablet 1   tamsulosin (FLOMAX) 0.4 MG CAPS capsule Take 0.4 mg by mouth at bedtime.     TURMERIC CURCUMIN PO Take 1 capsule by mouth daily.     Bacillus Coagulans-Inulin (PROBIOTIC-PREBIOTIC) 1-250 BILLION-MG CAPS Take 1 capsule by mouth daily. (Patient not taking: Reported on 10/14/2021)     No current facility-administered medications for this visit.    Allergies: Patient has no known allergies.  Past Medical History:  Diagnosis Date   Bipolar 1 disorder ([place])    COPD (chronic obstructive pulmonary disease) ([place])  Depression    First degree AV block    Hypertension    pt denies but was in medical record   Hypoglycemia, unspecified    Myalgia and myositis, unspecified    Obesity    Pneumonia 1970   Skin cancer (melanoma) ([place])    Thyroid disease    Unspecified hypothyroidism    UPJ obstruction, congenital     Past Surgical History:  Procedure Laterality Date   APPENDECTOMY  1939   malignant melanoma removed from right forehead  2008   PERMANENT PACEMAKER INSERTION N/A 06/19/2013   Procedure: PERMANENT PACEMAKER INSERTION;  Surgeon: Evans Lance, MD;  Location: [facility] CATH LAB;  Service: Cardiovascular;  Laterality: N/A;   right big toe  surgery  2009   TRANSURETHRAL RESECTION OF  PROSTATE  02/04/2011   Procedure: TRANSURETHRAL RESECTION OF THE PROSTATE (TURP);  Surgeon: Dutch Gray, MD;  Location: [facility];  Service: Urology;  Laterality: N/A;   URETHRA SURGERY  2010   reconstruction    Family History  Problem Relation Age of Onset   Cerebral aneurysm Mother    Lung cancer Father    Drug abuse Daughter     Social History   Tobacco Use   Smoking status: Former    Packs/day: 2.00    Years: 40.00    Total pack years: 80.00    Types: Cigarettes   Smokeless tobacco: Never  Substance Use Topics   Alcohol use: No    Subjective:   6 month follow up on chronic care needs; no acute concerns; in general, has been doing very well;  Seeing neurology every 6 months; actively working with PT and does feel beneficial;  Seeing pulmonology yearly;  Has had flu shot and COVID vaccine;      Objective:  Vitals:   11/04/21 1257  BP: 138/86  Pulse: 63  Temp: 97.6 F (36.4 C)  TempSrc: Oral  SpO2: 99%  Weight: 202 lb (91.6 kg)  Height: '6\' 4"'$  (1.93 m)    General: Well developed, well nourished, in no acute distress  Skin : Warm and dry.  Head: Normocephalic and atraumatic  Lungs: Respirations unlabored; clear to auscultation bilaterally without wheeze, rales, rhonchi  CVS exam: normal rate and regular rhythm.  Neurologic: Alert and oriented; speech intact; face symmetrical; uses cane for support; mildly shuffled gait;   Assessment:  1. Hypothyroidism due to medication   2. Bipolar I disorder, current or most recent episode depressed, in partial remission ([place])   3. COPD mixed type ([place])   4. Parkinson's disease, unspecified whether dyskinesia present, unspecified whether manifestations fluctuate     Plan:  Stable; plan to re-check labs in 6 months;  Stable; continue with psychiatrist as scheduled;  Stable; scheduled to see pulmonology in September 2024; Working with PT; scheduled to see neurology in March 2024;   Return in about 6 months (around 05/06/2022).   No orders of the defined types were placed in this encounter.   Requested Prescriptions    No prescriptions requested or ordered in this encounter

## 2021-11-05 ENCOUNTER — Ambulatory Visit: Payer: Medicare Other | Admitting: Physical Therapy

## 2021-11-05 ENCOUNTER — Encounter: Payer: Self-pay | Admitting: Physical Therapy

## 2021-11-05 ENCOUNTER — Ambulatory Visit: Payer: Medicare Other | Admitting: Family

## 2021-11-05 DIAGNOSIS — M6281 Muscle weakness (generalized): Secondary | ICD-10-CM

## 2021-11-05 DIAGNOSIS — R293 Abnormal posture: Secondary | ICD-10-CM

## 2021-11-05 DIAGNOSIS — R2689 Other abnormalities of gait and mobility: Secondary | ICD-10-CM | POA: Diagnosis not present

## 2021-11-05 DIAGNOSIS — R2681 Unsteadiness on feet: Secondary | ICD-10-CM

## 2021-11-05 NOTE — Therapy (Signed)
OUTPATIENT PHYSICAL THERAPY TREATMENT / PROGRESS NOTE   Patient Name: Victor Osborne MRN: [id number] DOB:1932-05-24, 88 y.o., male Today's Date: 11/05/2021  Progress Note  Reporting Period 10/14/2021 to 11/05/2021  See note below for Objective Data and Assessment of Progress/Goals.      PT End of Session - 11/05/21 1015     Visit Number 6    Date for PT Re-Evaluation 11/25/21    Authorization Type Medicare & [employer]    PT Start Time 1015    PT Stop Time 1059    PT Time Calculation (min) 44 min    Activity Tolerance Patient tolerated treatment well    Behavior During Therapy WFL for tasks assessed/performed               Past Medical History:  Diagnosis Date   Bipolar 1 disorder ([place])    COPD (chronic obstructive pulmonary disease) ([place])    Depression    First degree AV block    Hypertension    pt denies but was in medical record   Hypoglycemia, unspecified    Myalgia and myositis, unspecified    Obesity    Pneumonia 1970   Skin cancer (melanoma) ([place])    Thyroid disease    Unspecified hypothyroidism    UPJ obstruction, congenital    Past Surgical History:  Procedure Laterality Date   APPENDECTOMY  1939   malignant melanoma removed from right forehead  2008   PERMANENT PACEMAKER INSERTION N/A 06/19/2013   Procedure: PERMANENT PACEMAKER INSERTION;  Surgeon: Evans Lance, MD;  Location: [facility] CATH LAB;  Service: Cardiovascular;  Laterality: N/A;   right big toe  surgery  2009   TRANSURETHRAL RESECTION OF PROSTATE  02/04/2011   Procedure: TRANSURETHRAL RESECTION OF THE PROSTATE (TURP);  Surgeon: Dutch Gray, MD;  Location: [facility] ORS;  Service: Urology;  Laterality: N/A;   URETHRA SURGERY  2010   reconstruction   Patient Active Problem List   Diagnosis Date Noted   History of neoplasm 04/12/2021   Melanocytic nevi of trunk 04/12/2021   Other seborrheic keratosis 04/12/2021   Cerebral embolism with cerebral infarction 03/31/2021   Left leg weakness  03/30/2021   Chronic post-traumatic stress disorder 12/05/2017   Bipolar I disorder, current or most recent episode depressed, in partial remission ([place]) 12/05/2017   CHB (complete heart block) ([place]) 01/27/2017   HTN (hypertension) 01/27/2017   Hypothyroidism 10/11/2016   Parkinson disease 09/08/2016   Pacemaker 09/24/2013   SSS (sick sinus syndrome) ([place]) 06/19/2013   First degree AV block 04/09/2013   Allergic rhinitis 03/17/2013   Obstructive sleep apnea 12/18/2006   COPD mixed type ([place]) 12/18/2006   History of malignant melanoma of skin 12/18/2006   Diabetes mellitus ([place]) 12/18/2006    PCP: [name], FNP   REFERRING PROVIDER: Ludwig Clarks, DO   REFERRING DIAG: G20 (ICD-10-CM) - Parkinson disease ([place])  THERAPY DIAG:  Other abnormalities of gait and mobility  Unsteadiness on feet  Muscle weakness (generalized)  Abnormal posture  RATIONALE FOR EVALUATION AND TREATMENT: Rehabilitation  ONSET DATE: Initial PD diagnosis ~16 yrs ago  NEXT MD VISIT: 04/07/2022   SUBJECTIVE:      SUBJECTIVE STATEMENT: Pt reports "I'm always in trouble" but no specific problems today.  PAIN:  Are you having pain? No  PERTINENT HISTORY:  PPM 2 1st degree heart block & SA node dysfunction, possible CVA (ACA infarct), orthostatic hypotension, OSA, COPD, hypothyroidism, chronic PTSD, bipolar I disorder,  decreased energy/endurance since  COVID-19 in ~March 2022    PRECAUTIONS: Fall and ICD/Pacemaker  WEIGHT BEARING RESTRICTIONS No  FALLS: Has patient fallen in last 6 months? No  LIVING ENVIRONMENT: Lives with: lives alone and with his dog Lives in: [facility] facility (3rd floor) Stairs:  Elevator Has following equipment at home: Single point cane and hiking pole  OCCUPATION: Retired  PLOF: Independent and Leisure: being outdoors - walking his dog, and stays active most of the day     PATIENT GOALS: "Gain more strength in my legs and gain more balance  overall."   OBJECTIVE:   DIAGNOSTIC FINDINGS:  N/A  COGNITION: Overall cognitive status: Within functional limits for tasks assessed   SENSATION: WFL  COORDINATION: WFL for LE  MUSCLE TONE: WNL  MUSCLE LENGTH: Hamstrings: mod/severe tight B ITB: mod tight B Piriformis: mod tight B Hip IR: mod/severe tight B Hip flexors: mod/severe tight B Quads: mod tight R>L Heelcord: NT  POSTURE:  rounded shoulders, forward head, increased thoracic kyphosis, and flexed trunk   LOWER EXTREMITY ROM:    WFL other than limitations due to muscle tightness as indicated   LOWER EXTREMITY MMT:    MMT Right Eval Left Eval  Hip flexion 4+ 4  Hip extension 4 Limited ROM 4+ Limited ROM  Hip abduction 4+ 4+  Hip adduction 4+ 4  Hip internal rotation 4+ 4+  Hip external rotation 4+ 4  Knee flexion 5 5  Knee extension 5 5  Ankle dorsiflexion 4+ 4  Ankle plantarflexion 5 5-  Ankle inversion    Ankle eversion    (Blank rows = not tested)  BED MOBILITY:  Sit to supine SBA Supine to sit SBA Rolling to Right SBA Rolling to Left SBA  TRANSFERS: Assistive device utilized: None  Sit to stand: Complete Independence and Modified independence - UE assist necessary from lower surface height Stand to sit: Modified independence - uncontrolled descent w/o UE assist Chair to chair: Modified independence Floor:  NT  GAIT: Gait pattern: step through pattern, decreased arm swing- Left, decreased step length- Right, decreased trunk rotation, trunk flexed, and wide BOS Distance walked: 80 Assistive device utilized: Environmental consultant - 4 wheeled and None - 10MWT/balance assessments completed w/o AD Level of assistance: Modified independence Comments: Gait speed = 3.90 ft/sec (decreased from 4.34 ft/sec as of prior discharge from PT)  RAMP: Level of Assistance:  NT Assistive device utilized:  NT Ramp Comments:   CURB:  Level of Assistance:  NT Assistive device utilized:  NT Curb Comments:    STAIRS: (10/20/21)  Level of Assistance: SBA  Stair Negotiation Technique: Alternating Pattern  with Single Rail on Right  Number of Stairs: 14   Height of Stairs: 7  Comments: foot placement on steps with significant overhang of heel putting him at risk for posterior LOB    FUNCTIONAL TESTS:  5 times sit to stand: 19.32 sec, >16 sec indicates a fall risk for patients with PD Timed up and go (TUG): Normal = 11.06 sec; Manual = 14.04 sec; Cognitive = 16.59 sec (difficulty with cognitive task - Cognitive TUG > 15 sec = high fall risk in community dwelling older adults) 10 meter walk test: 8.41 sec (no AD); Gait speed = 3.90 ft/sec Berg Balance Scale: 42/56; 37-45 = significant risk for falls (>80%)  Functional gait assessment: 17/30; < 19 = high risk fall (10/20/21)  Scores as of discharge from last PT episode - December 2022: 5 times sit to stand: 13.75 sec Timed up and go (  TUG): Normal = 9.38 sec; Manual = NT; Cognitive = 11.88 sec 10 meter walk test: 7.56 sec (no AD); Gait speed = 4.34 ft/sec Berg: 53/56; 52-55 lower (> 25%) fall risk Functional gait assessment: 27/30; 25-28 = low risk fall   PATIENT SURVEYS:  ABC scale 1060 / 1600 = 66.3%   TODAY'S TREATMENT:  11/05/21 THERAPEUTIC EXERCISE: to improve flexibility, strength and mobility.  Verbal and tactile cues throughout for technique.  NuStep - L5 x 5 min B side-stepping along counter with looped RTB at ankles 3 x 10 ft Fwd/back monster walk along counter with looped RTB at ankles 3 x 10 ft with SBA of PT Standing alt hip extension and abduction with looped RTB at ankles x 10 each, UE support on counter Standing alt hip flexion march with looped RTB at midfoot x 10 each, UE support on counter  Fwd single LE step-over and back x 10 each leg - single UE support on counter   THERAPEUTIC ACTIVITIES: TUG (with SPC): Normal = 11.38 sec; Manual = 12.94 sec; Cognitive = 13.57 sec (difficulty with cognitive task)  SELF  CARE: Reviewed the "Check for Safety - Home Fall Prevention Checklist for Older Adults" to help identify fall risk hazards in the home along with strategies to reduce fall risk at home   11/02/21 THERAPEUTIC EXERCISE: to improve flexibility, strength and mobility.  Verbal and tactile cues throughout for technique.  Rec Bike - L3 x 5 min Seated lunge position hip flexor/quad stretch 2 x 30" Standing RTB 4-way SLR x 10 bil; 2 pole A for balance + SBA of PT Standing GTB row in staggered stance 2 x 10 Standing GTB scap retraction + shoulder extension to neutral in staggered stance 2 x 10; cues for upright posture   10/28/21 THERAPEUTIC EXERCISE: to improve flexibility, strength and mobility.  Verbal and tactile cues throughout for technique.  NuStep - L5 x 5 min Seated alternating RTB hip ABD/ER clam 10 x 3" Seated RTB hip flexion march 10 x 3" Seated hip ADD ball squeeze isometric + alternating RTB hip IR reverse clam 10 x 3" Seated RTB resisted DF 10 x 3"   NEUROMUSCULAR RE-EDUCATION: To improve posture, balance, coordination, reduce fall risk, amplitude of movement, speed of movement to reduce bradykinesia, and reduce rigidity. Seated PWR! Moves (Up, Rock, Twist, Step) x 10 each PWR! Sit to Stand 2 x 5  Standing PWR! Moves (Up, [employer], [employer], Step) x 10 each   PATIENT EDUCATION: Education details: progress with PT and HEP update - standing hip strengthening Person educated: Patient Education method: Explanation, Demonstration, and Verbal cues Education comprehension: verbalized understanding and returned demonstration   HOME EXERCISE PROGRAM: Access Code: [email or web address] URL: [email or web address] Date: 11/05/2021 Prepared by: Annie Paras  Exercises - Seated Hamstring Stretch with Strap  - 2 x daily - 7 x weekly - 3 reps - 30 sec hold - Seated Figure 4 Piriformis Stretch  - 2 x daily - 7 x weekly - 3 reps - 30 sec hold - Standing Hip Flexor Stretch  - 2 x daily - 7 x  weekly - 2 sets - 10 reps - 30 sec hold - Seated March with Resistance  - 1 x daily - 7 x weekly - 2 sets - 10 reps - 3 sec hold - Seated Isometric Hip Abduction with Resistance  - 1 x daily - 7 x weekly - 2 sets - 10 reps - 3 sec hold - Sit to Stand Without Arm Support  -  1 x daily - 7 x weekly - 2 sets - 10 reps - Seated Ankle Dorsiflexion with Resistance  - 1 x daily - 7 x weekly - 2 sets - 10 reps - 3 sec hold - Standing Hip Abduction with Resistance at Ankles and Counter Support  - 1 x daily - 3 x weekly - 2 sets - 10 reps - 3 sec hold - Standing Hip Extension with Resistance at Ankles and Counter Support  - 1 x daily - 3 x weekly - 2 sets - 10 reps - 3 sec hold - Marching with Resistance  - 1 x daily - 3 x weekly - 2 sets - 10 reps - 3 sec hold  Patient Education - Check for Safety  ASSESSMENT:  CLINICAL IMPRESSION: Daishon Chui" has been able to resume using his [facility] more rather than just the rollator for ambulation but continues to note feeling of weakness in his LEs. Slight gains noted in TUG times with STG #2 now met. We reviewed the "Check for Safety - Home Fall Prevention Checklist for Older Adults" and he notes most things he has already addressed but did state that he might look into anti-slip strips for his shower - STG #3 met. He opted to continue to focus on LE strengthening and balance today in place of PWR! Moves. Good tolerance for most exercises/activities, but closer guarding necessary with backward monster walk. HEP updated to some of the standing strengthening exercises with cues provided for safe set-up and performance at home. Rush Landmark is progressing well with PT but will continue to benefit from skilled PT to maximize LE strength and balance as well as facilitate improved posture and movement patterns related to his PD to reduce his fall risk.  OBJECTIVE IMPAIRMENTS: Abnormal gait, decreased activity tolerance, decreased balance, decreased endurance, decreased mobility,  difficulty walking, decreased strength, decreased safety awareness, impaired perceived functional ability, impaired flexibility, improper body mechanics, and postural dysfunction.   ACTIVITY LIMITATIONS: carrying, lifting, standing, squatting, stairs, transfers, bed mobility, reach over head, locomotion level, and caring for others  PARTICIPATION LIMITATIONS: meal prep, cleaning, laundry, driving, shopping, and community activity  PERSONAL FACTORS: Age, Fitness, Past/current experiences, Time since onset of injury/illness/exacerbation, and 3+ comorbidities: PPM 2 1st degree heart block & SA node dysfunction, possible CVA (ACA infarct), orthostatic hypotension, OSA, COPD, hypothyroidism, chronic PTSD, bipolar I disorder,  decreased energy/endurance since COVID-19 in ~March 2022   are also affecting patient's functional outcome.   REHAB POTENTIAL: Good  CLINICAL DECISION MAKING: Evolving/moderate complexity  EVALUATION COMPLEXITY: Moderate   GOALS: Goals reviewed with patient? Yes  SHORT TERM GOALS: Target date: 11/04/2021   Patient will be independent with initial HEP. Baseline:  Goal status: MET  11/02/21  2.  Patient will demonstrate decreased fall risk by scoring </= 15 sec on cognitive TUG. Baseline: 16.59 sec Goal status: MET  11/05/21 - Cognitive TUG = 13.57 sec  3.  Patient will be educated on strategies to decrease risk of falls.  Baseline:  Goal status: MET  11/05/21  LONG TERM GOALS: Target date: 11/25/2021   Patient will be independent with ongoing/advanced HEP for self-management at home incorporating PWR! Moves as indicated .  Baseline:  Goal status: IN PROGRESS  2.  Patient will ambulate at least 1000 ft with LRAD including outdoor and unlevel surfaces, independently with no LOB, for improved community gait. Baseline:  Goal status: IN PROGRESS  3.  Patient will be able to step up/down curb safely with LRAD for safety  with community ambulation.  Baseline:  Goal  status: IN PROGRESS   4.  Patient will improve 5x STS time to </= 16 seconds to demonstrate improved functional strength and transfer efficiency. Baseline: 19.32 sec Goal status: IN PROGRESS  5.  Patient will demonstrate at least 25/30 on FGA to improve gait stability and reduce risk for falls. (MCID = 4 points) Baseline: TBD Goal status: IN PROGRESS  6.  Patient will improve Berg score to >/= 52/56 to improve safety and stability with ADLs in standing and reduce risk for falls. (MCID = 8 points)   Baseline: 42/56 Goal status: IN PROGRESS  7.  Patient will report >/= 80% on ABC scale to demonstrate improved balance confidence and decreased risk for falls. Baseline: 1060 / 1600 = 66.3% Goal status: IN PROGRESS  8. Patient will verbalize understanding of local Parkinson's disease community resources, including community fitness post d/c. Baseline:  Goal status: IN PROGRESS   PLAN: PT FREQUENCY: 2x/week  PT DURATION: 6 weeks  PLANNED INTERVENTIONS: Therapeutic exercises, Therapeutic activity, Neuromuscular re-education, Balance training, Gait training, Patient/Family education, Self Care, Joint mobilization, Stair training, DME instructions, Manual therapy, and Re-evaluation  PLAN FOR NEXT SESSION:  progress strengthening and balance activities; review and progress PWR! Moves; review/update HEP as indicated   Percival Spanish, PT 11/05/2021, 1:02 PM

## 2021-11-08 ENCOUNTER — Ambulatory Visit: Payer: Medicare Other | Admitting: Physical Therapy

## 2021-11-08 ENCOUNTER — Encounter: Payer: Self-pay | Admitting: Physical Therapy

## 2021-11-08 DIAGNOSIS — R293 Abnormal posture: Secondary | ICD-10-CM

## 2021-11-08 DIAGNOSIS — R2681 Unsteadiness on feet: Secondary | ICD-10-CM

## 2021-11-08 DIAGNOSIS — R2689 Other abnormalities of gait and mobility: Secondary | ICD-10-CM

## 2021-11-08 DIAGNOSIS — M6281 Muscle weakness (generalized): Secondary | ICD-10-CM

## 2021-11-08 NOTE — Therapy (Signed)
OUTPATIENT PHYSICAL THERAPY TREATMENT    Patient Name: Victor Osborne MRN: [id number] DOB:1932-10-29, 88 y.o., male Today's Date: 11/08/2021      PT End of Session - 11/08/21 1017     Visit Number 7    Date for PT Re-Evaluation 11/25/21    Authorization Type Medicare & [employer]    Progress Note Due on Visit 12   PN completed on visit #6 - 11/05/21   PT Start Time 1017    PT Stop Time 1058    PT Time Calculation (min) 41 min    Activity Tolerance Patient tolerated treatment well    Behavior During Therapy WFL for tasks assessed/performed                Past Medical History:  Diagnosis Date   Bipolar 1 disorder ([place])    COPD (chronic obstructive pulmonary disease) ([place])    Depression    First degree AV block    Hypertension    pt denies but was in medical record   Hypoglycemia, unspecified    Myalgia and myositis, unspecified    Obesity    Pneumonia 1970   Skin cancer (melanoma) ([place])    Thyroid disease    Unspecified hypothyroidism    UPJ obstruction, congenital    Past Surgical History:  Procedure Laterality Date   APPENDECTOMY  1939   malignant melanoma removed from right forehead  2008   PERMANENT PACEMAKER INSERTION N/A 06/19/2013   Procedure: PERMANENT PACEMAKER INSERTION;  Surgeon: Evans Lance, MD;  Location: [facility] CATH LAB;  Service: Cardiovascular;  Laterality: N/A;   right big toe  surgery  2009   TRANSURETHRAL RESECTION OF PROSTATE  02/04/2011   Procedure: TRANSURETHRAL RESECTION OF THE PROSTATE (TURP);  Surgeon: Dutch Gray, MD;  Location: [facility] OR[facility];  Service: Urology;  Laterality: N/A;   URETHRA SURGERY  2010   reconstruction   Patient Active Problem List   Diagnosis Date Noted   History of neoplasm 04/12/2021   Melanocytic nevi of trunk 04/12/2021   Other seborrheic keratosis 04/12/2021   Cerebral embolism with cerebral infarction 03/31/2021   Left leg weakness 03/30/2021   Chronic post-traumatic stress disorder 12/05/2017    Bipolar I disorder, current or most recent episode depressed, in partial remission ([place]) 12/05/2017   CHB (complete heart block) ([place]) 01/27/2017   HTN (hypertension) 01/27/2017   Hypothyroidism 10/11/2016   Parkinson disease 09/08/2016   Pacemaker 09/24/2013   SSS (sick sinus syndrome) ([place]) 06/19/2013   First degree AV block 04/09/2013   Allergic rhinitis 03/17/2013   Obstructive sleep apnea 12/18/2006   COPD mixed type ([place]) 12/18/2006   History of malignant melanoma of skin 12/18/2006   Diabetes mellitus ([place]) 12/18/2006    PCP: [name], FNP   REFERRING PROVIDER: Ludwig Clarks, DO   REFERRING DIAG: G20 (ICD-10-CM) - Parkinson disease ([place])  THERAPY DIAG:  Other abnormalities of gait and mobility  Unsteadiness on feet  Muscle weakness (generalized)  Abnormal posture  RATIONALE FOR EVALUATION AND TREATMENT: Rehabilitation  ONSET DATE: Initial PD diagnosis ~16 yrs ago  NEXT MD VISIT: 04/07/2022   SUBJECTIVE:      SUBJECTIVE STATEMENT: Pt states "I just feel weak".  PAIN:  Are you having pain? No  PERTINENT HISTORY:  PPM 2 1st degree heart block & SA node dysfunction, possible CVA (ACA infarct), orthostatic hypotension, OSA, COPD, hypothyroidism, chronic PTSD, bipolar I disorder,  decreased energy/endurance since COVID-19 in ~March 2022    PRECAUTIONS: Fall  and ICD/Pacemaker  WEIGHT BEARING RESTRICTIONS No  FALLS: Has patient fallen in last 6 months? No  LIVING ENVIRONMENT: Lives with: lives alone and with his dog Lives in: [facility] living facility (3rd floor) Stairs:  Elevator Has following equipment at home: Single point cane and hiking pole  OCCUPATION: Retired  PLOF: Independent and Leisure: being outdoors - walking his dog, and stays active most of the day     PATIENT GOALS: "Gain more strength in my legs and gain more balance overall."   OBJECTIVE:   DIAGNOSTIC FINDINGS:  N/A  COGNITION: Overall cognitive status: Within  functional limits for tasks assessed   SENSATION: WFL  COORDINATION: WFL for LE  MUSCLE TONE: WNL  MUSCLE LENGTH: Hamstrings: mod/severe tight B ITB: mod tight B Piriformis: mod tight B Hip IR: mod/severe tight B Hip flexors: mod/severe tight B Quads: mod tight R>L Heelcord: NT  POSTURE:  rounded shoulders, forward head, increased thoracic kyphosis, and flexed trunk   LOWER EXTREMITY ROM:    WFL other than limitations due to muscle tightness as indicated   LOWER EXTREMITY MMT:    MMT Right Eval Left Eval  Hip flexion 4+ 4  Hip extension 4 Limited ROM 4+ Limited ROM  Hip abduction 4+ 4+  Hip adduction 4+ 4  Hip internal rotation 4+ 4+  Hip external rotation 4+ 4  Knee flexion 5 5  Knee extension 5 5  Ankle dorsiflexion 4+ 4  Ankle plantarflexion 5 5-  Ankle inversion    Ankle eversion    (Blank rows = not tested)  BED MOBILITY:  Sit to supine SBA Supine to sit SBA Rolling to Right SBA Rolling to Left SBA  TRANSFERS: Assistive device utilized: None  Sit to stand: Complete Independence and Modified independence - UE assist necessary from lower surface height Stand to sit: Modified independence - uncontrolled descent w/o UE assist Chair to chair: Modified independence Floor:  NT  GAIT: Gait pattern: step through pattern, decreased arm swing- Left, decreased step length- Right, decreased trunk rotation, trunk flexed, and wide BOS Distance walked: 80 Assistive device utilized: Environmental consultant - 4 wheeled and None - 10MWT/balance assessments completed w/o AD Level of assistance: Modified independence Comments: Gait speed = 3.90 ft/sec (decreased from 4.34 ft/sec as of prior discharge from PT)  RAMP: Level of Assistance:  NT Assistive device utilized:  NT Ramp Comments:   CURB:  Level of Assistance:  NT Assistive device utilized:  NT Curb Comments:   STAIRS: (10/20/21)  Level of Assistance: SBA  Stair Negotiation Technique: Alternating Pattern  with Single  Rail on Right  Number of Stairs: 14   Height of Stairs: 7  Comments: foot placement on steps with significant overhang of heel putting him at risk for posterior LOB    FUNCTIONAL TESTS:  5 times sit to stand: 19.32 sec, >16 sec indicates a fall risk for patients with PD Timed up and go (TUG): Normal = 11.06 sec; Manual = 14.04 sec; Cognitive = 16.59 sec (difficulty with cognitive task - Cognitive TUG > 15 sec = high fall risk in community dwelling older adults) 10 meter walk test: 8.41 sec (no AD); Gait speed = 3.90 ft/sec Berg Balance Scale: 42/56; 37-45 = significant risk for falls (>80%)  Functional gait assessment: 17/30; < 19 = high risk fall (10/20/21)  Scores as of discharge from last PT episode - December 2022: 5 times sit to stand: 13.75 sec Timed up and go (TUG): Normal = 9.38 sec; Manual = NT; Cognitive =  11.88 sec 10 meter walk test: 7.56 sec (no AD); Gait speed = 4.34 ft/sec Berg: 53/56; 52-55 lower (> 25%) fall risk Functional gait assessment: 27/30; 25-28 = low risk fall   PATIENT SURVEYS:  ABC scale 1060 / 1600 = 66.3%   TODAY'S TREATMENT:  11/08/21 THERAPEUTIC EXERCISE: to improve flexibility, strength and mobility.  Verbal and tactile cues throughout for technique.  NuStep - L6 x 6 min Eccentric sit-down/squat with light glute touch to Airex pad in chair x 10  NEUROMUSCULAR RE-EDUCATION: To improve posture, balance, coordination, reduce fall risk, amplitude of movement, speed of movement to reduce bradykinesia, and reduce rigidity. PWR! Step forward & back x 10 each bil, single UE support on counter  PWR! Step through forward & back x 10 bil, single UE support on counter  PWR! B Side step along counter 4 x 10 ft each direction - cues to pick up rather than drag trailing foot  Reciprocal step over balance pebbles x 10 passes B side-step over balance pebbles x 5 passes each direction, intermittent UE support on counter   11/05/21 THERAPEUTIC EXERCISE: to improve  flexibility, strength and mobility.  Verbal and tactile cues throughout for technique.  NuStep - L5 x 6 min B side-stepping along counter with looped RTB at ankles 3 x 10 ft Fwd/back monster walk along counter with looped RTB at ankles 3 x 10 ft with SBA of PT Standing alt hip extension and abduction with looped RTB at ankles x 10 each, UE support on counter Standing alt hip flexion march with looped RTB at midfoot x 10 each, UE support on counter  Fwd single LE step-over and back x 10 each leg - single UE support on counter   THERAPEUTIC ACTIVITIES: TUG (with SPC): Normal = 11.38 sec; Manual = 12.94 sec; Cognitive = 13.57 sec (difficulty with cognitive task)  SELF CARE: Reviewed the "Check for Safety - Home Fall Prevention Checklist for Older Adults" to help identify fall risk hazards in the home along with strategies to reduce fall risk at home   11/02/21 THERAPEUTIC EXERCISE: to improve flexibility, strength and mobility.  Verbal and tactile cues throughout for technique.  Rec Bike - L3 x 5 min Seated lunge position hip flexor/quad stretch 2 x 30" Standing RTB 4-way SLR x 10 bil; 2 pole A for balance + SBA of PT Standing GTB row in staggered stance 2 x 10 Standing GTB scap retraction + shoulder extension to neutral in staggered stance 2 x 10; cues for upright posture   PATIENT EDUCATION: Education details: progress with PT and HEP update - standing hip strengthening Person educated: Patient Education method: Explanation, Demonstration, and Verbal cues Education comprehension: verbalized understanding and returned demonstration   HOME EXERCISE PROGRAM: Access Code: [email or web address] URL: [email or web address] Date: 11/05/2021 Prepared by: Annie Paras  Exercises - Seated Hamstring Stretch with Strap  - 2 x daily - 7 x weekly - 3 reps - 30 sec hold - Seated Figure 4 Piriformis Stretch  - 2 x daily - 7 x weekly - 3 reps - 30 sec hold - Standing Hip Flexor Stretch  - 2 x  daily - 7 x weekly - 2 sets - 10 reps - 30 sec hold - Seated March with Resistance  - 1 x daily - 7 x weekly - 2 sets - 10 reps - 3 sec hold - Seated Isometric Hip Abduction with Resistance  - 1 x daily - 7 x weekly - 2 sets - 10 reps - 3  sec hold - Sit to Stand Without Arm Support  - 1 x daily - 7 x weekly - 2 sets - 10 reps - Seated Ankle Dorsiflexion with Resistance  - 1 x daily - 7 x weekly - 2 sets - 10 reps - 3 sec hold - Standing Hip Abduction with Resistance at Ankles and Counter Support  - 1 x daily - 3 x weekly - 2 sets - 10 reps - 3 sec hold - Standing Hip Extension with Resistance at Ankles and Counter Support  - 1 x daily - 3 x weekly - 2 sets - 10 reps - 3 sec hold - Marching with Resistance  - 1 x daily - 3 x weekly - 2 sets - 10 reps - 3 sec hold  Patient Education - Check for Safety  ASSESSMENT:  CLINICAL IMPRESSION: Jakayden Cancio" continues to feel limited due to LE weakness and balance issues, therefore continued dynamic stepping and strengthening activities in standing. Intermittent seated rest breaks required due to fatigue but all exercises/activities otherwise well tolerated, although pt did note increased effort necessary with squats/eccentric sit-downs.  OBJECTIVE IMPAIRMENTS: Abnormal gait, decreased activity tolerance, decreased balance, decreased endurance, decreased mobility, difficulty walking, decreased strength, decreased safety awareness, impaired perceived functional ability, impaired flexibility, improper body mechanics, and postural dysfunction.   ACTIVITY LIMITATIONS: carrying, lifting, standing, squatting, stairs, transfers, bed mobility, reach over head, locomotion level, and caring for others  PARTICIPATION LIMITATIONS: meal prep, cleaning, laundry, driving, shopping, and community activity  PERSONAL FACTORS: Age, Fitness, Past/current experiences, Time since onset of injury/illness/exacerbation, and 3+ comorbidities: PPM 2 1st degree heart block & SA  node dysfunction, possible CVA (ACA infarct), orthostatic hypotension, OSA, COPD, hypothyroidism, chronic PTSD, bipolar I disorder,  decreased energy/endurance since COVID-19 in ~March 2022   are also affecting patient's functional outcome.   REHAB POTENTIAL: Good  CLINICAL DECISION MAKING: Evolving/moderate complexity  EVALUATION COMPLEXITY: Moderate   GOALS: Goals reviewed with patient? Yes  SHORT TERM GOALS: Target date: 11/04/2021   Patient will be independent with initial HEP. Baseline:  Goal status: MET  11/02/21  2.  Patient will demonstrate decreased fall risk by scoring </= 15 sec on cognitive TUG. Baseline: 16.59 sec Goal status: MET  11/05/21 - Cognitive TUG = 13.57 sec  3.  Patient will be educated on strategies to decrease risk of falls.  Baseline:  Goal status: MET  11/05/21  LONG TERM GOALS: Target date: 11/25/2021   Patient will be independent with ongoing/advanced HEP for self-management at home incorporating PWR! Moves as indicated .  Baseline:  Goal status: IN PROGRESS  2.  Patient will ambulate at least 1000 ft with LRAD including outdoor and unlevel surfaces, independently with no LOB, for improved community gait. Baseline:  Goal status: IN PROGRESS  3.  Patient will be able to step up/down curb safely with LRAD for safety with community ambulation.  Baseline:  Goal status: IN PROGRESS   4.  Patient will improve 5x STS time to </= 16 seconds to demonstrate improved functional strength and transfer efficiency. Baseline: 19.32 sec Goal status: IN PROGRESS  5.  Patient will demonstrate at least 25/30 on FGA to improve gait stability and reduce risk for falls. (MCID = 4 points) Baseline: TBD Goal status: IN PROGRESS  6.  Patient will improve Berg score to >/= 52/56 to improve safety and stability with ADLs in standing and reduce risk for falls. (MCID = 8 points)   Baseline: 42/56 Goal status: IN PROGRESS  7.  Patient will report >/= 80% on ABC scale  to demonstrate improved balance confidence and decreased risk for falls. Baseline: 1060 / 1600 = 66.3% Goal status: IN PROGRESS  8. Patient will verbalize understanding of local Parkinson's disease community resources, including community fitness post d/c. Baseline:  Goal status: IN PROGRESS   PLAN: PT FREQUENCY: 2x/week  PT DURATION: 6 weeks  PLANNED INTERVENTIONS: Therapeutic exercises, Therapeutic activity, Neuromuscular re-education, Balance training, Gait training, Patient/Family education, Self Care, Joint mobilization, Stair training, DME instructions, Manual therapy, and Re-evaluation  PLAN FOR NEXT SESSION:  progress strengthening and balance activities; review and progress PWR! Moves; review/update HEP as indicated   Percival Spanish, PT 11/08/2021, 10:58 AM

## 2021-11-11 ENCOUNTER — Ambulatory Visit: Payer: Medicare Other | Admitting: Physical Therapy

## 2021-11-11 ENCOUNTER — Encounter: Payer: Self-pay | Admitting: Physical Therapy

## 2021-11-11 DIAGNOSIS — R2689 Other abnormalities of gait and mobility: Secondary | ICD-10-CM | POA: Diagnosis not present

## 2021-11-11 DIAGNOSIS — M6281 Muscle weakness (generalized): Secondary | ICD-10-CM

## 2021-11-11 DIAGNOSIS — R2681 Unsteadiness on feet: Secondary | ICD-10-CM

## 2021-11-11 DIAGNOSIS — R293 Abnormal posture: Secondary | ICD-10-CM

## 2021-11-11 NOTE — Therapy (Signed)
OUTPATIENT PHYSICAL THERAPY TREATMENT    Patient Name: Victor Osborne MRN: [id number] DOB:11/23/1932, 88 y.o., male Today's Date: 11/11/2021      PT End of Session - 11/11/21 1104     Visit Number 8    Date for PT Re-Evaluation 11/25/21    Authorization Type Medicare & [employer]    Progress Note Due on Visit 12   PN completed on visit #6 - 11/05/21   PT Start Time 1104    PT Stop Time 9977    PT Time Calculation (min) 41 min    Activity Tolerance Patient tolerated treatment well    Behavior During Therapy WFL for tasks assessed/performed                Past Medical History:  Diagnosis Date   Bipolar 1 disorder ([place])    COPD (chronic obstructive pulmonary disease) ([place])    Depression    First degree AV block    Hypertension    pt denies but was in medical record   Hypoglycemia, unspecified    Myalgia and myositis, unspecified    Obesity    Pneumonia 1970   Skin cancer (melanoma) ([place])    Thyroid disease    Unspecified hypothyroidism    UPJ obstruction, congenital    Past Surgical History:  Procedure Laterality Date   APPENDECTOMY  1939   malignant melanoma removed from right forehead  2008   PERMANENT PACEMAKER INSERTION N/A 06/19/2013   Procedure: PERMANENT PACEMAKER INSERTION;  Surgeon: Evans Lance, MD;  Location: [facility] CATH LAB;  Service: Cardiovascular;  Laterality: N/A;   right big toe  surgery  2009   TRANSURETHRAL RESECTION OF PROSTATE  02/04/2011   Procedure: TRANSURETHRAL RESECTION OF THE PROSTATE (TURP);  Surgeon: Dutch Gray, MD;  Location: [facility] ORS;  Service: Urology;  Laterality: N/A;   URETHRA SURGERY  2010   reconstruction   Patient Active Problem List   Diagnosis Date Noted   History of neoplasm 04/12/2021   Melanocytic nevi of trunk 04/12/2021   Other seborrheic keratosis 04/12/2021   Cerebral embolism with cerebral infarction 03/31/2021   Left leg weakness 03/30/2021   Chronic post-traumatic stress disorder 12/05/2017    Bipolar I disorder, current or most recent episode depressed, in partial remission ([place]) 12/05/2017   CHB (complete heart block) ([place]) 01/27/2017   HTN (hypertension) 01/27/2017   Hypothyroidism 10/11/2016   Parkinson disease 09/08/2016   Pacemaker 09/24/2013   SSS (sick sinus syndrome) ([place]) 06/19/2013   First degree AV block 04/09/2013   Allergic rhinitis 03/17/2013   Obstructive sleep apnea 12/18/2006   COPD mixed type ([place]) 12/18/2006   History of malignant melanoma of skin 12/18/2006   Diabetes mellitus ([place]) 12/18/2006    PCP: [name], FNP   REFERRING PROVIDER: Ludwig Clarks, DO   REFERRING DIAG: G20 (ICD-10-CM) - Parkinson disease ([place])  THERAPY DIAG:  Other abnormalities of gait and mobility  Unsteadiness on feet  Muscle weakness (generalized)  Abnormal posture  RATIONALE FOR EVALUATION AND TREATMENT: Rehabilitation  ONSET DATE: Initial PD diagnosis ~16 yrs ago  NEXT MD VISIT: 04/07/2022   SUBJECTIVE:      SUBJECTIVE STATEMENT: Pt reports increased soreness in his legs after going to the ball game on Mon night and having to walk a long distance uphill on gravel to get to the bathroom.  PAIN:  Are you having pain? No - just sore in B legs  PERTINENT HISTORY:  PPM 2 1st degree heart block &  SA node dysfunction, possible CVA (ACA infarct), orthostatic hypotension, OSA, COPD, hypothyroidism, chronic PTSD, bipolar I disorder,  decreased energy/endurance since COVID-19 in ~March 2022    PRECAUTIONS: Fall and ICD/Pacemaker  WEIGHT BEARING RESTRICTIONS No  FALLS: Has patient fallen in last 6 months? No  LIVING ENVIRONMENT: Lives with: lives alone and with his dog Lives in: [facility] living facility (3rd floor) Stairs:  Elevator Has following equipment at home: Single point cane and hiking pole  OCCUPATION: Retired  PLOF: Independent and Leisure: being outdoors - walking his dog, and stays active most of the day     PATIENT GOALS: "Gain  more strength in my legs and gain more balance overall."   OBJECTIVE:   DIAGNOSTIC FINDINGS:  N/A  COGNITION: Overall cognitive status: Within functional limits for tasks assessed   SENSATION: WFL  COORDINATION: WFL for LE  MUSCLE TONE: WNL  MUSCLE LENGTH: Hamstrings: mod/severe tight B ITB: mod tight B Piriformis: mod tight B Hip IR: mod/severe tight B Hip flexors: mod/severe tight B Quads: mod tight R>L Heelcord: NT  POSTURE:  rounded shoulders, forward head, increased thoracic kyphosis, and flexed trunk   LOWER EXTREMITY ROM:    WFL other than limitations due to muscle tightness as indicated   LOWER EXTREMITY MMT:    MMT Right Eval Left Eval  Hip flexion 4+ 4  Hip extension 4 Limited ROM 4+ Limited ROM  Hip abduction 4+ 4+  Hip adduction 4+ 4  Hip internal rotation 4+ 4+  Hip external rotation 4+ 4  Knee flexion 5 5  Knee extension 5 5  Ankle dorsiflexion 4+ 4  Ankle plantarflexion 5 5-  Ankle inversion    Ankle eversion    (Blank rows = not tested)  BED MOBILITY:  Sit to supine SBA Supine to sit SBA Rolling to Right SBA Rolling to Left SBA  TRANSFERS: Assistive device utilized: None  Sit to stand: Complete Independence and Modified independence - UE assist necessary from lower surface height Stand to sit: Modified independence - uncontrolled descent w/o UE assist Chair to chair: Modified independence Floor:  NT  GAIT: Gait pattern: step through pattern, decreased arm swing- Left, decreased step length- Right, decreased trunk rotation, trunk flexed, and wide BOS Distance walked: 80 Assistive device utilized: Environmental consultant - 4 wheeled and None - 10MWT/balance assessments completed w/o AD Level of assistance: Modified independence Comments: Gait speed = 3.90 ft/sec (decreased from 4.34 ft/sec as of prior discharge from PT)  RAMP: Level of Assistance:  NT Assistive device utilized:  NT Ramp Comments:   CURB:  Level of Assistance:   NT Assistive device utilized:  NT Curb Comments:   STAIRS: (10/20/21)  Level of Assistance: SBA  Stair Negotiation Technique: Alternating Pattern  with Single Rail on Right  Number of Stairs: 14   Height of Stairs: 7  Comments: foot placement on steps with significant overhang of heel putting him at risk for posterior LOB    FUNCTIONAL TESTS:  5 times sit to stand: 19.32 sec, >16 sec indicates a fall risk for patients with PD Timed up and go (TUG): Normal = 11.06 sec; Manual = 14.04 sec; Cognitive = 16.59 sec (difficulty with cognitive task - Cognitive TUG > 15 sec = high fall risk in community dwelling older adults) 10 meter walk test: 8.41 sec (no AD); Gait speed = 3.90 ft/sec Berg Balance Scale: 42/56; 37-45 = significant risk for falls (>80%)  Functional gait assessment: 17/30; < 19 = high risk fall (10/20/21)  Scores  as of discharge from last PT episode - December 2022: 5 times sit to stand: 13.75 sec Timed up and go (TUG): Normal = 9.38 sec; Manual = NT; Cognitive = 11.88 sec 10 meter walk test: 7.56 sec (no AD); Gait speed = 4.34 ft/sec Berg: 53/56; 52-55 lower (> 25%) fall risk Functional gait assessment: 27/30; 25-28 = low risk fall   PATIENT SURVEYS:  ABC scale 1060 / 1600 = 66.3%   TODAY'S TREATMENT:  11/11/21 THERAPEUTIC EXERCISE: to improve flexibility, strength and mobility.  Verbal and tactile cues throughout for technique.  NuStep - L6 x 10 min Seated HS stretch +/- strap for heelcord stretch 2 x 30" bil Seated figure-4 hip hinge piriformis stretch 2 x 30" bil Standing forward lunge hip flexor stretch 2 x 30" bil  NEUROMUSCULAR RE-EDUCATION: To improve posture, balance, coordination, reduce fall risk, amplitude of movement, speed of movement to reduce bradykinesia, and reduce rigidity. PWR! Step forward & back x 10 each bil, no UE support but SBA of PT PWR! Step through forward & back x 10 bil, single UE support on counter - cue necessary for reciprocal UE  motion relative to LE PWR! Walk with and w/o SPC x 150 ft each - good reciprocal coordination of SPC on R with L foot but minimal to no L UE reciprocal swing when ambulating with SPC; better UE swing w/o AD but reciprocal motion not fully coordinated  11/08/21 THERAPEUTIC EXERCISE: to improve flexibility, strength and mobility.  Verbal and tactile cues throughout for technique.  NuStep - L6 x 6 min Eccentric sit-down/squat with light glute touch to Airex pad in chair x 10  NEUROMUSCULAR RE-EDUCATION: To improve posture, balance, coordination, reduce fall risk, amplitude of movement, speed of movement to reduce bradykinesia, and reduce rigidity. PWR! Step forward & back x 10 each bil, single UE support on counter  PWR! Step through forward & back x 10 bil, single UE support on counter  PWR! B Side step along counter 4 x 10 ft each direction - cues to pick up rather than drag trailing foot  Reciprocal step over balance pebbles x 10 passes B side-step over balance pebbles x 5 passes each direction, intermittent UE support on counter   11/05/21 THERAPEUTIC EXERCISE: to improve flexibility, strength and mobility.  Verbal and tactile cues throughout for technique.  NuStep - L5 x 6 min B side-stepping along counter with looped RTB at ankles 3 x 10 ft Fwd/back monster walk along counter with looped RTB at ankles 3 x 10 ft with SBA of PT Standing alt hip extension and abduction with looped RTB at ankles x 10 each, UE support on counter Standing alt hip flexion march with looped RTB at midfoot x 10 each, UE support on counter  Fwd single LE step-over and back x 10 each leg - single UE support on counter   THERAPEUTIC ACTIVITIES: TUG (with SPC): Normal = 11.38 sec; Manual = 12.94 sec; Cognitive = 13.57 sec (difficulty with cognitive task)  SELF CARE: Reviewed the "Check for Safety - Home Fall Prevention Checklist for Older Adults" to help identify fall risk hazards in the home along with strategies  to reduce fall risk at home   11/02/21 THERAPEUTIC EXERCISE: to improve flexibility, strength and mobility.  Verbal and tactile cues throughout for technique.  Rec Bike - L3 x 5 min Seated lunge position hip flexor/quad stretch 2 x 30" Standing RTB 4-way SLR x 10 bil; 2 pole A for balance + SBA of PT  Standing GTB row in staggered stance 2 x 10 Standing GTB scap retraction + shoulder extension to neutral in staggered stance 2 x 10; cues for upright posture   PATIENT EDUCATION: Education details: progress with PT and HEP update - standing hip strengthening Person educated: Patient Education method: Explanation, Demonstration, and Verbal cues Education comprehension: verbalized understanding and returned demonstration   HOME EXERCISE PROGRAM: Access Code: [email or web address] URL: [email or web address] Date: 11/05/2021 Prepared by: Annie Paras  Exercises - Seated Hamstring Stretch with Strap  - 2 x daily - 7 x weekly - 3 reps - 30 sec hold - Seated Figure 4 Piriformis Stretch  - 2 x daily - 7 x weekly - 3 reps - 30 sec hold - Standing Hip Flexor Stretch  - 2 x daily - 7 x weekly - 2 sets - 10 reps - 30 sec hold - Seated March with Resistance  - 1 x daily - 7 x weekly - 2 sets - 10 reps - 3 sec hold - Seated Isometric Hip Abduction with Resistance  - 1 x daily - 7 x weekly - 2 sets - 10 reps - 3 sec hold - Sit to Stand Without Arm Support  - 1 x daily - 7 x weekly - 2 sets - 10 reps - Seated Ankle Dorsiflexion with Resistance  - 1 x daily - 7 x weekly - 2 sets - 10 reps - 3 sec hold - Standing Hip Abduction with Resistance at Ankles and Counter Support  - 1 x daily - 3 x weekly - 2 sets - 10 reps - 3 sec hold - Standing Hip Extension with Resistance at Ankles and Counter Support  - 1 x daily - 3 x weekly - 2 sets - 10 reps - 3 sec hold - Marching with Resistance  - 1 x daily - 3 x weekly - 2 sets - 10 reps - 3 sec hold  Patient Education - Check for  Safety  ASSESSMENT:  CLINICAL IMPRESSION: Beauregard Jarrells" reports all of his HEP handouts from out current and prior PT episodes were damaged when water leaked over all of the papers - handouts reprinted and briefly reviewed to make sure everything was accounted for. He notes increased soreness this week after increased exertion while attending a ball game on Mon evening. Following warm-up, we reviewed his HEP stretches to help relax his muscles and relieve some of the soreness. He was able to reduce need for UE support with isolated PWR! Step forward and back but continued to require single UE support during PWR! Step through. Promoted carryover of reciprocal motions into PWR! Walk with good reciprocal coordination of SPC on R with L foot but minimal to no L UE reciprocal swing when ambulating with SPC, however better UE swing w/o AD but reciprocal motion not fully coordinated. Rush Landmark is progressing with PT but feels like his progress is slower than during his prior PT episode - he may benefit from recert at the end of the current POC pending overall progress.  OBJECTIVE IMPAIRMENTS: Abnormal gait, decreased activity tolerance, decreased balance, decreased endurance, decreased mobility, difficulty walking, decreased strength, decreased safety awareness, impaired perceived functional ability, impaired flexibility, improper body mechanics, and postural dysfunction.   ACTIVITY LIMITATIONS: carrying, lifting, standing, squatting, stairs, transfers, bed mobility, reach over head, locomotion level, and caring for others  PARTICIPATION LIMITATIONS: meal prep, cleaning, laundry, driving, shopping, and community activity  PERSONAL FACTORS: Age, Fitness, Past/current experiences, Time since onset of injury/illness/exacerbation, and 3+ comorbidities: PPM 2  1st degree heart block & SA node dysfunction, possible CVA (ACA infarct), orthostatic hypotension, OSA, COPD, hypothyroidism, chronic PTSD, bipolar I disorder,   decreased energy/endurance since COVID-19 in ~March 2022   are also affecting patient's functional outcome.   REHAB POTENTIAL: Good  CLINICAL DECISION MAKING: Evolving/moderate complexity  EVALUATION COMPLEXITY: Moderate   GOALS: Goals reviewed with patient? Yes  SHORT TERM GOALS: Target date: 11/04/2021   Patient will be independent with initial HEP. Baseline:  Goal status: MET  11/02/21  2.  Patient will demonstrate decreased fall risk by scoring </= 15 sec on cognitive TUG. Baseline: 16.59 sec Goal status: MET  11/05/21 - Cognitive TUG = 13.57 sec  3.  Patient will be educated on strategies to decrease risk of falls.  Baseline:  Goal status: MET  11/05/21  LONG TERM GOALS: Target date: 11/25/2021   Patient will be independent with ongoing/advanced HEP for self-management at home incorporating PWR! Moves as indicated .  Baseline:  Goal status: IN PROGRESS  2.  Patient will ambulate at least 1000 ft with LRAD including outdoor and unlevel surfaces, independently with no LOB, for improved community gait. Baseline:  Goal status: IN PROGRESS  3.  Patient will be able to step up/down curb safely with LRAD for safety with community ambulation.  Baseline:  Goal status: IN PROGRESS   4.  Patient will improve 5x STS time to </= 16 seconds to demonstrate improved functional strength and transfer efficiency. Baseline: 19.32 sec Goal status: IN PROGRESS  5.  Patient will demonstrate at least 25/30 on FGA to improve gait stability and reduce risk for falls. (MCID = 4 points) Baseline: TBD Goal status: IN PROGRESS  6.  Patient will improve Berg score to >/= 52/56 to improve safety and stability with ADLs in standing and reduce risk for falls. (MCID = 8 points)   Baseline: 42/56 Goal status: IN PROGRESS  7.  Patient will report >/= 80% on ABC scale to demonstrate improved balance confidence and decreased risk for falls. Baseline: 1060 / 1600 = 66.3% Goal status: IN  PROGRESS  8. Patient will verbalize understanding of local Parkinson's disease community resources, including community fitness post d/c. Baseline:  Goal status: IN PROGRESS   PLAN: PT FREQUENCY: 2x/week  PT DURATION: 6 weeks  PLANNED INTERVENTIONS: Therapeutic exercises, Therapeutic activity, Neuromuscular re-education, Balance training, Gait training, Patient/Family education, Self Care, Joint mobilization, Stair training, DME instructions, Manual therapy, and Re-evaluation  PLAN FOR NEXT SESSION:  progress strengthening and balance activities; review and progress PWR! Moves; review/update HEP as indicated   Percival Spanish, PT 11/11/2021, 11:56 AM

## 2021-11-15 ENCOUNTER — Encounter: Payer: Self-pay | Admitting: Physical Therapy

## 2021-11-15 ENCOUNTER — Ambulatory Visit: Payer: Medicare Other | Admitting: Physical Therapy

## 2021-11-15 DIAGNOSIS — R2689 Other abnormalities of gait and mobility: Secondary | ICD-10-CM

## 2021-11-15 DIAGNOSIS — M6281 Muscle weakness (generalized): Secondary | ICD-10-CM

## 2021-11-15 DIAGNOSIS — R2681 Unsteadiness on feet: Secondary | ICD-10-CM

## 2021-11-15 DIAGNOSIS — R293 Abnormal posture: Secondary | ICD-10-CM

## 2021-11-15 NOTE — Therapy (Signed)
OUTPATIENT PHYSICAL THERAPY TREATMENT    Patient Name: Victor Osborne MRN: [id number] DOB:Jan 01, 1933, 88 y.o., male Today's Date: 11/15/2021      PT End of Session - 11/15/21 1101     Visit Number 9    Date for PT Re-Evaluation 11/25/21    Authorization Type Medicare & [employer]    Progress Note Due on Visit 12   PN completed on visit #6 - 11/05/21   PT Start Time 1101    PT Stop Time 1145    PT Time Calculation (min) 44 min    Activity Tolerance Patient tolerated treatment well    Behavior During Therapy WFL for tasks assessed/performed                Past Medical History:  Diagnosis Date   Bipolar 1 disorder ([place])    COPD (chronic obstructive pulmonary disease) ([place])    Depression    First degree AV block    Hypertension    pt denies but was in medical record   Hypoglycemia, unspecified    Myalgia and myositis, unspecified    Obesity    Pneumonia 1970   Skin cancer (melanoma) ([place])    Thyroid disease    Unspecified hypothyroidism    UPJ obstruction, congenital    Past Surgical History:  Procedure Laterality Date   APPENDECTOMY  1939   malignant melanoma removed from right forehead  2008   PERMANENT PACEMAKER INSERTION N/A 06/19/2013   Procedure: PERMANENT PACEMAKER INSERTION;  Surgeon: Evans Lance, MD;  Location: [facility] CATH LAB;  Service: Cardiovascular;  Laterality: N/A;   right big toe  surgery  2009   TRANSURETHRAL RESECTION OF PROSTATE  02/04/2011   Procedure: TRANSURETHRAL RESECTION OF THE PROSTATE (TURP);  Surgeon: Dutch Gray, MD;  Location: [facility] ORS;  Service: Urology;  Laterality: N/A;   URETHRA SURGERY  2010   reconstruction   Patient Active Problem List   Diagnosis Date Noted   History of neoplasm 04/12/2021   Melanocytic nevi of trunk 04/12/2021   Other seborrheic keratosis 04/12/2021   Cerebral embolism with cerebral infarction 03/31/2021   Left leg weakness 03/30/2021   Chronic post-traumatic stress disorder 12/05/2017    Bipolar I disorder, current or most recent episode depressed, in partial remission ([place]) 12/05/2017   CHB (complete heart block) ([place]) 01/27/2017   HTN (hypertension) 01/27/2017   Hypothyroidism 10/11/2016   Parkinson disease 09/08/2016   Pacemaker 09/24/2013   SSS (sick sinus syndrome) ([place]) 06/19/2013   First degree AV block 04/09/2013   Allergic rhinitis 03/17/2013   Obstructive sleep apnea 12/18/2006   COPD mixed type ([place]) 12/18/2006   History of malignant melanoma of skin 12/18/2006   Diabetes mellitus ([place]) 12/18/2006    PCP: [name], FNP   REFERRING PROVIDER: Ludwig Clarks, DO   REFERRING DIAG: G20 (ICD-10-CM) - Parkinson disease ([place])  THERAPY DIAG:  Other abnormalities of gait and mobility  Unsteadiness on feet  Muscle weakness (generalized)  Abnormal posture  RATIONALE FOR EVALUATION AND TREATMENT: Rehabilitation  ONSET DATE: Initial PD diagnosis ~16 yrs ago  NEXT MD VISIT: 04/07/2022   SUBJECTIVE:      SUBJECTIVE STATEMENT: Pt reports he feels more tired/"run down" this morning even though he slept a lot over the weekend.  PAIN:  Are you having pain? No   PERTINENT HISTORY:  PPM 2 1st degree heart block & SA node dysfunction, possible CVA (ACA infarct), orthostatic hypotension, OSA, COPD, hypothyroidism, chronic PTSD, bipolar I disorder,  decreased energy/endurance since COVID-19 in ~March 2022    PRECAUTIONS: Fall and ICD/Pacemaker  WEIGHT BEARING RESTRICTIONS No  FALLS: Has patient fallen in last 6 months? No  LIVING ENVIRONMENT: Lives with: lives alone and with his dog Lives in: [facility] (3rd floor) Stairs:  Elevator Has following equipment at home: Single point cane and hiking pole  OCCUPATION: Retired  PLOF: Independent and Leisure: being outdoors - walking his dog, and stays active most of the day     PATIENT GOALS: "Gain more strength in my legs and gain more balance overall."   OBJECTIVE:    DIAGNOSTIC FINDINGS:  N/A  COGNITION: Overall cognitive status: Within functional limits for tasks assessed   SENSATION: WFL  COORDINATION: WFL for LE  MUSCLE TONE: WNL  MUSCLE LENGTH: Hamstrings: mod/severe tight B ITB: mod tight B Piriformis: mod tight B Hip IR: mod/severe tight B Hip flexors: mod/severe tight B Quads: mod tight R>L Heelcord: NT  POSTURE:  rounded shoulders, forward head, increased thoracic kyphosis, and flexed trunk   LOWER EXTREMITY ROM:    WFL other than limitations due to muscle tightness as indicated   LOWER EXTREMITY MMT:    MMT Right Eval Left Eval  Hip flexion 4+ 4  Hip extension 4 Limited ROM 4+ Limited ROM  Hip abduction 4+ 4+  Hip adduction 4+ 4  Hip internal rotation 4+ 4+  Hip external rotation 4+ 4  Knee flexion 5 5  Knee extension 5 5  Ankle dorsiflexion 4+ 4  Ankle plantarflexion 5 5-  Ankle inversion    Ankle eversion    (Blank rows = not tested)  BED MOBILITY:  Sit to supine SBA Supine to sit SBA Rolling to Right SBA Rolling to Left SBA  TRANSFERS: Assistive device utilized: None  Sit to stand: Complete Independence and Modified independence - UE assist necessary from lower surface height Stand to sit: Modified independence - uncontrolled descent w/o UE assist Chair to chair: Modified independence Floor:  NT  GAIT: Gait pattern: step through pattern, decreased arm swing- Left, decreased step length- Right, decreased trunk rotation, trunk flexed, and wide BOS Distance walked: 80 Assistive device utilized: Environmental consultant - 4 wheeled and None - 10MWT/balance assessments completed w/o AD Level of assistance: Modified independence Comments: Gait speed = 3.90 ft/sec (decreased from 4.34 ft/sec as of prior discharge from PT)  RAMP: Level of Assistance:  NT Assistive device utilized:  NT Ramp Comments:   CURB:  Level of Assistance:  NT Assistive device utilized:  NT Curb Comments:   STAIRS: (10/20/21)  Level of  Assistance: SBA  Stair Negotiation Technique: Alternating Pattern  with Single Rail on Right  Number of Stairs: 14   Height of Stairs: 7  Comments: foot placement on steps with significant overhang of heel putting him at risk for posterior LOB    FUNCTIONAL TESTS:  5 times sit to stand: 19.32 sec, >16 sec indicates a fall risk for patients with PD Timed up and go (TUG): Normal = 11.06 sec; Manual = 14.04 sec; Cognitive = 16.59 sec (difficulty with cognitive task - Cognitive TUG > 15 sec = high fall risk in community dwelling older adults) 10 meter walk test: 8.41 sec (no AD); Gait speed = 3.90 ft/sec Berg Balance Scale: 42/56; 37-45 = significant risk for falls (>80%)  Functional gait assessment: 17/30; < 19 = high risk fall (10/20/21)  Scores as of discharge from last PT episode - December 2022: 5 times sit to stand: 13.75 sec Timed  up and go (TUG): Normal = 9.38 sec; Manual = NT; Cognitive = 11.88 sec 10 meter walk test: 7.56 sec (no AD); Gait speed = 4.34 ft/sec Berg: 53/56; 52-55 lower (> 25%) fall risk Functional gait assessment: 27/30; 25-28 = low risk fall   PATIENT SURVEYS:  ABC scale 1060 / 1600 = 66.3%   TODAY'S TREATMENT:  11/15/21 THERAPEUTIC EXERCISE: to improve flexibility, strength and mobility.  Verbal and tactile cues throughout for technique.  NuStep - L6 x 10 min  NEUROMUSCULAR RE-EDUCATION: To improve posture, balance, proprioception, coordination, reduce fall risk, amplitude of movement, speed of movement to reduce bradykinesia, and reduce rigidity. R/L SLS + 1/2 circle star taps to colored dots x 4 revolutions Standing on Airex pad: Lateral weight shifts x 20 Heel-toe weight shifts x 20 Stepping in place x 2 min - initially with light B HHA, progressing to single HHA then briefly unsupported  Standing PWR! Up, Rock & Twist x 10 reps each PWR! Step and turn x 5 - pt having difficulty with foot clearance, therefore transitioned to  High step 1/4 turn x 10  (moving from edge of mat table to corner to better pt confidence)   11/11/21 THERAPEUTIC EXERCISE: to improve flexibility, strength and mobility.  Verbal and tactile cues throughout for technique.  NuStep - L6 x 10 min Seated HS stretch +/- strap for heelcord stretch 2 x 30" bil Seated figure-4 hip hinge piriformis stretch 2 x 30" bil Standing forward lunge hip flexor stretch 2 x 30" bil  NEUROMUSCULAR RE-EDUCATION: To improve posture, balance, coordination, reduce fall risk, amplitude of movement, speed of movement to reduce bradykinesia, and reduce rigidity. PWR! Step forward & back x 10 each bil, no UE support but SBA of PT PWR! Step through forward & back x 10 bil, single UE support on counter - cue necessary for reciprocal UE motion relative to LE PWR! Walk with and w/o SPC x 150 ft each - good reciprocal coordination of SPC on R with L foot but minimal to no L UE reciprocal swing when ambulating with SPC; better UE swing w/o AD but reciprocal motion not fully coordinated   11/08/21 THERAPEUTIC EXERCISE: to improve flexibility, strength and mobility.  Verbal and tactile cues throughout for technique.  NuStep - L6 x 6 min Eccentric sit-down/squat with light glute touch to Airex pad in chair x 10  NEUROMUSCULAR RE-EDUCATION: To improve posture, balance, coordination, reduce fall risk, amplitude of movement, speed of movement to reduce bradykinesia, and reduce rigidity. PWR! Step forward & back x 10 each bil, single UE support on counter  PWR! Step through forward & back x 10 bil, single UE support on counter  PWR! B Side step along counter 4 x 10 ft each direction - cues to pick up rather than drag trailing foot  Reciprocal step over balance pebbles x 10 passes B side-step over balance pebbles x 5 passes each direction, intermittent UE support on counter   PATIENT EDUCATION: Education details: progress with PT Person educated: Patient Education method: Explanation Education  comprehension: verbalized understanding   HOME EXERCISE PROGRAM: Access Code: [email or web address] URL: [email or web address] Date: 11/05/2021 Prepared by: Annie Paras  Exercises - Seated Hamstring Stretch with Strap  - 2 x daily - 7 x weekly - 3 reps - 30 sec hold - Seated Figure 4 Piriformis Stretch  - 2 x daily - 7 x weekly - 3 reps - 30 sec hold - Standing Hip Flexor Stretch  - 2 x daily - 7  x weekly - 2 sets - 10 reps - 30 sec hold - Seated March with Resistance  - 1 x daily - 7 x weekly - 2 sets - 10 reps - 3 sec hold - Seated Isometric Hip Abduction with Resistance  - 1 x daily - 7 x weekly - 2 sets - 10 reps - 3 sec hold - Sit to Stand Without Arm Support  - 1 x daily - 7 x weekly - 2 sets - 10 reps - Seated Ankle Dorsiflexion with Resistance  - 1 x daily - 7 x weekly - 2 sets - 10 reps - 3 sec hold - Standing Hip Abduction with Resistance at Ankles and Counter Support  - 1 x daily - 3 x weekly - 2 sets - 10 reps - 3 sec hold - Standing Hip Extension with Resistance at Ankles and Counter Support  - 1 x daily - 3 x weekly - 2 sets - 10 reps - 3 sec hold - Marching with Resistance  - 1 x daily - 3 x weekly - 2 sets - 10 reps - 3 sec hold  Patient Education - Check for Safety   ASSESSMENT:  CLINICAL IMPRESSION: Thorne Wirz" reports his LEs feel stronger but his balance seems unchanged, therefore session focusing on balance and dynamic stepping activities in SLS and on Airex pad for unstable surface. Standing PWR! Moves incorporated on Airex pad to work on proprioception with weight shifting. PWR! Step progressed to include turning motion but pt having difficulty with good foot clearance, therefore reduced step pattern to high-stepping 1/4 turns with pt noting better confidence when performed in corner for safety. Bill reports he feels better and more stable by end of session today as compared to arrival to PT.  OBJECTIVE IMPAIRMENTS: Abnormal gait, decreased activity  tolerance, decreased balance, decreased endurance, decreased mobility, difficulty walking, decreased strength, decreased safety awareness, impaired perceived functional ability, impaired flexibility, improper body mechanics, and postural dysfunction.   ACTIVITY LIMITATIONS: carrying, lifting, standing, squatting, stairs, transfers, bed mobility, reach over head, locomotion level, and caring for others  PARTICIPATION LIMITATIONS: meal prep, cleaning, laundry, driving, shopping, and community activity  PERSONAL FACTORS: Age, Fitness, Past/current experiences, Time since onset of injury/illness/exacerbation, and 3+ comorbidities: PPM 2 1st degree heart block & SA node dysfunction, possible CVA (ACA infarct), orthostatic hypotension, OSA, COPD, hypothyroidism, chronic PTSD, bipolar I disorder,  decreased energy/endurance since COVID-19 in ~March 2022   are also affecting patient's functional outcome.   REHAB POTENTIAL: Good  CLINICAL DECISION MAKING: Evolving/moderate complexity  EVALUATION COMPLEXITY: Moderate   GOALS: Goals reviewed with patient? Yes  SHORT TERM GOALS: Target date: 11/04/2021   Patient will be independent with initial HEP. Baseline:  Goal status: MET  11/02/21  2.  Patient will demonstrate decreased fall risk by scoring </= 15 sec on cognitive TUG. Baseline: 16.59 sec Goal status: MET  11/05/21 - Cognitive TUG = 13.57 sec  3.  Patient will be educated on strategies to decrease risk of falls.  Baseline:  Goal status: MET  11/05/21  LONG TERM GOALS: Target date: 11/25/2021   Patient will be independent with ongoing/advanced HEP for self-management at home incorporating PWR! Moves as indicated .  Baseline:  Goal status: IN PROGRESS  2.  Patient will ambulate at least 1000 ft with LRAD including outdoor and unlevel surfaces, independently with no LOB, for improved community gait. Baseline:  Goal status: IN PROGRESS  3.  Patient will be able to step up/down curb  safely  with LRAD for safety with community ambulation.  Baseline:  Goal status: IN PROGRESS   4.  Patient will improve 5x STS time to </= 16 seconds to demonstrate improved functional strength and transfer efficiency. Baseline: 19.32 sec Goal status: IN PROGRESS  5.  Patient will demonstrate at least 25/30 on FGA to improve gait stability and reduce risk for falls. (MCID = 4 points) Baseline: TBD Goal status: IN PROGRESS  6.  Patient will improve Berg score to >/= 52/56 to improve safety and stability with ADLs in standing and reduce risk for falls. (MCID = 8 points)   Baseline: 42/56 Goal status: IN PROGRESS  7.  Patient will report >/= 80% on ABC scale to demonstrate improved balance confidence and decreased risk for falls. Baseline: 1060 / 1600 = 66.3% Goal status: IN PROGRESS  8. Patient will verbalize understanding of local Parkinson's disease community resources, including community fitness post d/c. Baseline:  Goal status: IN PROGRESS   PLAN: PT FREQUENCY: 2x/week  PT DURATION: 6 weeks  PLANNED INTERVENTIONS: Therapeutic exercises, Therapeutic activity, Neuromuscular re-education, Balance training, Gait training, Patient/Family education, Self Care, Joint mobilization, Stair training, DME instructions, Manual therapy, and Re-evaluation  PLAN FOR NEXT SESSION:  initiate goal assessment to determine readiness to transition to HEP at end of POC vs need for recert; progress strengthening and balance activities; review and progress PWR! Moves; review/update HEP as indicated   Percival Spanish, PT 11/15/2021, 12:05 PM

## 2021-11-16 ENCOUNTER — Encounter: Payer: Self-pay | Admitting: *Deleted

## 2021-11-16 ENCOUNTER — Telehealth: Payer: Self-pay | Admitting: *Deleted

## 2021-11-16 NOTE — Patient Outreach (Signed)
  Care Coordination   Initial Visit Note   11/16/2021 Name: Victor Osborne MRN: [id number] DOB: 1932-06-03  Victor Osborne is a 88 y.o. year old male who sees [name], [place] for primary care. I spoke with  [name] by phone today.  What matters to the patients health and wellness today?  "I am doing just fine; not having any problems; I go to physical therapy twice a week and it is really helping my legs get stronger.  I have not had any falls in a very long time; I do use my rollator walker and my cane, pretty much all the time.  But I am able to drive and get out to go get something to eat, and I take my little dog with me everywhere I go.  I am doing okay"  Interventions provided; no further or ongoing care coordination needs identified    Goals Addressed             This Visit's Progress    COMPLETED: Care Coordination Activities: No follow up Required   On track    Care Coordination Interventions: Evaluation of current treatment plan related to Parkinson's disease and patient's adherence to plan as established by provider Advised patient to provide appropriate vaccination information to provider or CM team member at next visit Advised patient to continue efforts at fall prevention, attending outpatient physical therapy, using assistive devices as recommended  Provided education to patient re: fall prevention, benefits of remaining as active as possible without over-doing, benefits of maintaining mobility with aging Reviewed scheduled/upcoming provider appointments including ongoing outpatient physical therapy twice a week Screening for signs and symptoms of depression related to chronic disease state  Assessed social determinant of health barriers Confirmed patient attended Medicare Annual Wellness Visit on 05/14/21; obtained flu and covid vaccines for 2023-24 flu/ winter season; confirmed patient self-manages medications at home and does not have  any questions or concerns around current medications: patient endorses adherence to taking all medications as prescribed         SDOH assessments and interventions completed:  Yes  SDOH Interventions Today    Flowsheet Row Most Recent Value  SDOH Interventions   Food Insecurity Interventions Intervention Not Indicated  Housing Interventions Intervention Not Indicated  Transportation Interventions Intervention Not Indicated  [drives self]       Care Coordination Interventions Activated:  Yes  Care Coordination Interventions:  Yes, provided   Follow up plan: No further intervention required.   Encounter Outcome:  Pt. Visit Completed   Oneta Rack, RN, BSN, CCRN Alumnus RN CM Care Coordination/ Transition of [place] Management [phone]: direct office

## 2021-11-16 NOTE — Patient Instructions (Signed)
Visit Information  Thank you for taking time to visit with me today. Please don't hesitate to contact me if I can be of assistance to you.   Following are the goals we discussed today:   Goals Addressed             This Visit's Progress    COMPLETED: Care Coordination Activities: No follow up Required   On track    Care Coordination Interventions: Evaluation of current treatment plan related to Parkinson's disease and patient's adherence to plan as established by provider Advised patient to provide appropriate vaccination information to provider or CM team member at next visit Advised patient to continue efforts at fall prevention, attending outpatient physical therapy, using assistive devices as recommended  Provided education to patient re: fall prevention, benefits of remaining as active as possible without over-doing, benefits of maintaining mobility with aging Reviewed scheduled/upcoming provider appointments including ongoing outpatient physical therapy twice a week Screening for signs and symptoms of depression related to chronic disease state  Assessed social determinant of health barriers Confirmed patient attended Medicare Annual Wellness Visit on 05/14/21; obtained flu and covid vaccines for 2023-24 flu/ winter season; confirmed patient self-manages medications at home and does not have any questions or concerns around current medications: patient endorses adherence to taking all medications as prescribed         If you are experiencing a Mental Health or [place] or need someone to talk to, please  call the Suicide and Crisis Lifeline: 988 call the [place] National Suicide Prevention Lifeline: (219)749-5677 or TTY: 4847373297 TTY 321-649-4929) to talk to a trained counselor call 1-800-273-TALK (toll free, 24 hour hotline) go to [facility] [address] [place] [phone]) call the [place]:  [phone] call 911   Patient verbalizes understanding of instructions and care plan provided today and agrees to view in [place]. Active MyChart status and patient understanding of how to access instructions and care plan via MyChart confirmed with patient.     No further follow up required: no further or ongoing care coordination needs identified  Oneta Rack, RN, BSN, CCRN Alumnus RN CM Care Coordination/ Transition of [place] Management [phone]: direct office

## 2021-11-18 ENCOUNTER — Encounter: Payer: Self-pay | Admitting: Physical Therapy

## 2021-11-18 ENCOUNTER — Ambulatory Visit: Payer: Medicare Other | Attending: Neurology | Admitting: Physical Therapy

## 2021-11-18 DIAGNOSIS — R293 Abnormal posture: Secondary | ICD-10-CM | POA: Insufficient documentation

## 2021-11-18 DIAGNOSIS — R2681 Unsteadiness on feet: Secondary | ICD-10-CM | POA: Insufficient documentation

## 2021-11-18 DIAGNOSIS — M6281 Muscle weakness (generalized): Secondary | ICD-10-CM | POA: Diagnosis present

## 2021-11-18 DIAGNOSIS — R2689 Other abnormalities of gait and mobility: Secondary | ICD-10-CM | POA: Diagnosis not present

## 2021-11-18 NOTE — Therapy (Signed)
OUTPATIENT PHYSICAL THERAPY TREATMENT    Patient Name: Victor Osborne MRN: [id number] DOB:1932/04/12, 88 y.o., male Today's Date: 11/18/2021      PT End of Session - 11/18/21 1057     Visit Number 10    Date for PT Re-Evaluation 11/25/21    Authorization Type Medicare & [employer]    Progress Note Due on Visit 12   PN completed on visit #6 - 11/05/21   PT Start Time 1057    PT Stop Time 1147    PT Time Calculation (min) 50 min    Activity Tolerance Patient tolerated treatment well    Behavior During Therapy WFL for tasks assessed/performed                Past Medical History:  Diagnosis Date   Bipolar 1 disorder ([place])    COPD (chronic obstructive pulmonary disease) ([place])    Depression    First degree AV block    Hypertension    pt denies but was in medical record   Hypoglycemia, unspecified    Myalgia and myositis, unspecified    Obesity    Pneumonia 1970   Skin cancer (melanoma) ([place])    Thyroid disease    Unspecified hypothyroidism    UPJ obstruction, congenital    Past Surgical History:  Procedure Laterality Date   APPENDECTOMY  1939   malignant melanoma removed from right forehead  2008   PERMANENT PACEMAKER INSERTION N/A 06/19/2013   Procedure: PERMANENT PACEMAKER INSERTION;  Surgeon: Evans Lance, MD;  Location: [facility] CATH LAB;  Service: Cardiovascular;  Laterality: N/A;   right big toe  surgery  2009   TRANSURETHRAL RESECTION OF PROSTATE  02/04/2011   Procedure: TRANSURETHRAL RESECTION OF THE PROSTATE (TURP);  Surgeon: Dutch Gray, MD;  Location: [facility] ORS;  Service: Urology;  Laterality: N/A;   URETHRA SURGERY  2010   reconstruction   Patient Active Problem List   Diagnosis Date Noted   History of neoplasm 04/12/2021   Melanocytic nevi of trunk 04/12/2021   Other seborrheic keratosis 04/12/2021   Cerebral embolism with cerebral infarction 03/31/2021   Left leg weakness 03/30/2021   Chronic post-traumatic stress disorder 12/05/2017    Bipolar I disorder, current or most recent episode depressed, in partial remission ([place]) 12/05/2017   CHB (complete heart block) ([place]) 01/27/2017   HTN (hypertension) 01/27/2017   Hypothyroidism 10/11/2016   Parkinson disease 09/08/2016   Pacemaker 09/24/2013   SSS (sick sinus syndrome) ([place]) 06/19/2013   First degree AV block 04/09/2013   Allergic rhinitis 03/17/2013   Obstructive sleep apnea 12/18/2006   COPD mixed type ([place]) 12/18/2006   History of malignant melanoma of skin 12/18/2006   Diabetes mellitus ([place]) 12/18/2006    PCP: [name], FNP   REFERRING PROVIDER: Ludwig Clarks, DO   REFERRING DIAG: G20 (ICD-10-CM) - Parkinson disease ([place])  THERAPY DIAG:  Other abnormalities of gait and mobility  Unsteadiness on feet  Muscle weakness (generalized)  Abnormal posture  RATIONALE FOR EVALUATION AND TREATMENT: Rehabilitation  ONSET DATE: Initial PD diagnosis ~16 yrs ago  NEXT MD VISIT: 04/07/2022   SUBJECTIVE:      SUBJECTIVE STATEMENT: Pt continues to c/o "weakness" in his legs as well as impaired balance.  PAIN:  Are you having pain? No   PERTINENT HISTORY:  PPM 2 1st degree heart block & SA node dysfunction, possible CVA (ACA infarct), orthostatic hypotension, OSA, COPD, hypothyroidism, chronic PTSD, bipolar I disorder,  decreased energy/endurance since COVID-19  in ~March 2022    PRECAUTIONS: Fall and ICD/Pacemaker  WEIGHT BEARING RESTRICTIONS No  FALLS: Has patient fallen in last 6 months? No  LIVING ENVIRONMENT: Lives with: lives alone and with his dog Lives in: [facility] living facility (3rd floor) Stairs:  Elevator Has following equipment at home: Single point cane and hiking pole  OCCUPATION: Retired  PLOF: Independent and Leisure: being outdoors - walking his dog, and stays active most of the day     PATIENT GOALS: "Gain more strength in my legs and gain more balance overall."   OBJECTIVE:   DIAGNOSTIC FINDINGS:   N/A  COGNITION: Overall cognitive status: Within functional limits for tasks assessed   SENSATION: WFL  COORDINATION: WFL for LE  MUSCLE TONE: WNL  MUSCLE LENGTH: Hamstrings: mod/severe tight B ITB: mod tight B Piriformis: mod tight B Hip IR: mod/severe tight B Hip flexors: mod/severe tight B Quads: mod tight R>L Heelcord: NT  POSTURE:  rounded shoulders, forward head, increased thoracic kyphosis, and flexed trunk   LOWER EXTREMITY ROM:    WFL other than limitations due to muscle tightness as indicated   LOWER EXTREMITY MMT:    MMT Right Eval Left Eval  Hip flexion 4+ 4  Hip extension 4 Limited ROM 4+ Limited ROM  Hip abduction 4+ 4+  Hip adduction 4+ 4  Hip internal rotation 4+ 4+  Hip external rotation 4+ 4  Knee flexion 5 5  Knee extension 5 5  Ankle dorsiflexion 4+ 4  Ankle plantarflexion 5 5-  Ankle inversion    Ankle eversion    (Blank rows = not tested)  BED MOBILITY:  Sit to supine SBA Supine to sit SBA Rolling to Right SBA Rolling to Left SBA  TRANSFERS: Assistive device utilized: None  Sit to stand: Complete Independence and Modified independence - UE assist necessary from lower surface height Stand to sit: Modified independence - uncontrolled descent w/o UE assist Chair to chair: Modified independence Floor:  NT  GAIT: Gait pattern: step through pattern, decreased arm swing- Left, decreased step length- Right, decreased trunk rotation, trunk flexed, and wide BOS Distance walked: 80 Assistive device utilized: Environmental consultant - 4 wheeled and None - 10MWT/balance assessments completed w/o AD Level of assistance: Modified independence Comments: Gait speed = 3.90 ft/sec (decreased from 4.34 ft/sec as of prior discharge from PT)  RAMP: Level of Assistance:  NT Assistive device utilized:  NT Ramp Comments:   CURB:  Level of Assistance:  NT Assistive device utilized:  NT Curb Comments:   STAIRS: (10/20/21)  Level of Assistance: SBA  Stair  Negotiation Technique: Alternating Pattern  with Single Rail on Right  Number of Stairs: 14   Height of Stairs: 7  Comments: foot placement on steps with significant overhang of heel putting him at risk for posterior LOB    FUNCTIONAL TESTS:  5 times sit to stand: 19.32 sec, >16 sec indicates a fall risk for patients with PD Timed up and go (TUG): Normal = 11.06 sec; Manual = 14.04 sec; Cognitive = 16.59 sec (difficulty with cognitive task - Cognitive TUG > 15 sec = high fall risk in community dwelling older adults) 10 meter walk test: 8.41 sec (no AD); Gait speed = 3.90 ft/sec Berg Balance Scale: 42/56; 37-45 = significant risk for falls (>80%)  Functional gait assessment: 17/30; < 19 = high risk fall (10/20/21)  Scores as of discharge from last PT episode - December 2022: 5 times sit to stand: 13.75 sec Timed up and go (TUG):  Normal = 9.38 sec; Manual = NT; Cognitive = 11.88 sec 10 meter walk test: 7.56 sec (no AD); Gait speed = 4.34 ft/sec Berg: 53/56; 52-55 lower (> 25%) fall risk Functional gait assessment: 27/30; 25-28 = low risk fall   PATIENT SURVEYS:  ABC scale 1060 / 1600 = 66.3%   TODAY'S TREATMENT:  11/18/21 THERAPEUTIC EXERCISE: to improve flexibility, strength and mobility.  Verbal and tactile cues throughout for technique.  NuStep - L6 x 5 min  THERAPEUTIC ACTIVITIES: Sit <> stand utilizing PWR! Up for forward momentum x 10 Rolling and supine to sit using combination of supine PWR! Moves to facilitate momentum to increase ease of transition to sitting x ~10 reps  NEUROMUSCULAR RE-EDUCATION: To improve coordination, amplitude of movement, speed of movement to reduce bradykinesia, and reduce rigidity. Supine PWR! Moves (Up, Rock, Twist, Step) x 10 R/L step + 1/4 turn x 10 - cues for good foot clearance but pt reporting increased LOB with high step   11/15/21 THERAPEUTIC EXERCISE: to improve flexibility, strength and mobility.  Verbal and tactile cues throughout for  technique.  NuStep - L6 x 10 min  NEUROMUSCULAR RE-EDUCATION: To improve posture, balance, proprioception, coordination, reduce fall risk, amplitude of movement, speed of movement to reduce bradykinesia, and reduce rigidity. R/L SLS + 1/2 circle star taps to colored dots x 4 revolutions Standing on Airex pad: Lateral weight shifts x 20 Heel-toe weight shifts x 20 Stepping in place x 2 min - initially with light B HHA, progressing to single HHA then briefly unsupported  Standing PWR! Up, Rock & Twist x 10 reps each PWR! Step and turn x 5 - pt having difficulty with foot clearance, therefore transitioned to  High step 1/4 turn x 10 (moving from edge of mat table to corner to better pt confidence)   11/11/21 THERAPEUTIC EXERCISE: to improve flexibility, strength and mobility.  Verbal and tactile cues throughout for technique.  NuStep - L6 x 10 min Seated HS stretch +/- strap for heelcord stretch 2 x 30" bil Seated figure-4 hip hinge piriformis stretch 2 x 30" bil Standing forward lunge hip flexor stretch 2 x 30" bil  NEUROMUSCULAR RE-EDUCATION: To improve posture, balance, coordination, reduce fall risk, amplitude of movement, speed of movement to reduce bradykinesia, and reduce rigidity. PWR! Step forward & back x 10 each bil, no UE support but SBA of PT PWR! Step through forward & back x 10 bil, single UE support on counter - cue necessary for reciprocal UE motion relative to LE PWR! Walk with and w/o SPC x 150 ft each - good reciprocal coordination of SPC on R with L foot but minimal to no L UE reciprocal swing when ambulating with SPC; better UE swing w/o AD but reciprocal motion not fully coordinated   PATIENT EDUCATION: Education details: progress with PT Person educated: Patient Education method: Explanation Education comprehension: verbalized understanding   HOME EXERCISE PROGRAM: Access Code: [email or web address] URL: [email or web address] Date: 11/05/2021 Prepared by:  Annie Paras  Exercises - Seated Hamstring Stretch with Strap  - 2 x daily - 7 x weekly - 3 reps - 30 sec hold - Seated Figure 4 Piriformis Stretch  - 2 x daily - 7 x weekly - 3 reps - 30 sec hold - Standing Hip Flexor Stretch  - 2 x daily - 7 x weekly - 2 sets - 10 reps - 30 sec hold - Seated March with Resistance  - 1 x daily - 7 x weekly - 2 sets -  10 reps - 3 sec hold - Seated Isometric Hip Abduction with Resistance  - 1 x daily - 7 x weekly - 2 sets - 10 reps - 3 sec hold - Sit to Stand Without Arm Support  - 1 x daily - 7 x weekly - 2 sets - 10 reps - Seated Ankle Dorsiflexion with Resistance  - 1 x daily - 7 x weekly - 2 sets - 10 reps - 3 sec hold - Standing Hip Abduction with Resistance at Ankles and Counter Support  - 1 x daily - 3 x weekly - 2 sets - 10 reps - 3 sec hold - Standing Hip Extension with Resistance at Ankles and Counter Support  - 1 x daily - 3 x weekly - 2 sets - 10 reps - 3 sec hold - Marching with Resistance  - 1 x daily - 3 x weekly - 2 sets - 10 reps - 3 sec hold  PWR! Moves: - Seated - Standing - Supine  Patient Education - Check for Safety   ASSESSMENT:  CLINICAL IMPRESSION: Yusef Lamp" notes difficulty with some transitional motions including sit to stand and getting out of bed, therefore reviewed relevant seated and supine PWR! Moves and provided instruction in carryover of the PWR! Moves patterns into creating increased momentum to promote increased ease of transitional movements - pt noting more comfort and ease of mobility when incorporating PWR! Moves patterns. He continues to lack confidence with turning, therefore reviewed stepping patterns modifying height of step enough to allow for safe foot clearance but not too high so as to create instability or LOB. Encouraged Bill to identify any other areas of concern related to mobility in order to determine need for recert vs readiness for transition to HEP. Will plan to address these concerns and  initiate goal assessment including assessment of outdoor mobility and gait next visit, weather permitting. Upon leaving, he expressed difficulty maneuvering his coat behind his back to don his coat and/or vest - will try to work on strategies with him to assist with dressing tasks.  OBJECTIVE IMPAIRMENTS: Abnormal gait, decreased activity tolerance, decreased balance, decreased endurance, decreased mobility, difficulty walking, decreased strength, decreased safety awareness, impaired perceived functional ability, impaired flexibility, improper body mechanics, and postural dysfunction.   ACTIVITY LIMITATIONS: carrying, lifting, standing, squatting, stairs, transfers, bed mobility, reach over head, locomotion level, and caring for others  PARTICIPATION LIMITATIONS: meal prep, cleaning, laundry, driving, shopping, and community activity  PERSONAL FACTORS: Age, Fitness, Past/current experiences, Time since onset of injury/illness/exacerbation, and 3+ comorbidities: PPM 2 1st degree heart block & SA node dysfunction, possible CVA (ACA infarct), orthostatic hypotension, OSA, COPD, hypothyroidism, chronic PTSD, bipolar I disorder,  decreased energy/endurance since COVID-19 in ~March 2022   are also affecting patient's functional outcome.   REHAB POTENTIAL: Good  CLINICAL DECISION MAKING: Evolving/moderate complexity  EVALUATION COMPLEXITY: Moderate   GOALS: Goals reviewed with patient? Yes  SHORT TERM GOALS: Target date: 11/04/2021   Patient will be independent with initial HEP. Baseline:  Goal status: MET  11/02/21  2.  Patient will demonstrate decreased fall risk by scoring </= 15 sec on cognitive TUG. Baseline: 16.59 sec Goal status: MET  11/05/21 - Cognitive TUG = 13.57 sec  3.  Patient will be educated on strategies to decrease risk of falls.  Baseline:  Goal status: MET  11/05/21  LONG TERM GOALS: Target date: 11/25/2021   Patient will be independent with ongoing/advanced HEP for  self-management at home incorporating PWR! Moves as  indicated .  Baseline:  Goal status: IN PROGRESS  2.  Patient will ambulate at least 1000 ft with LRAD including outdoor and unlevel surfaces, independently with no LOB, for improved community gait. Baseline:  Goal status: IN PROGRESS  3.  Patient will be able to step up/down curb safely with LRAD for safety with community ambulation.  Baseline:  Goal status: IN PROGRESS   4.  Patient will improve 5x STS time to </= 16 seconds to demonstrate improved functional strength and transfer efficiency. Baseline: 19.32 sec Goal status: IN PROGRESS  5.  Patient will demonstrate at least 25/30 on FGA to improve gait stability and reduce risk for falls. (MCID = 4 points) Baseline: TBD Goal status: IN PROGRESS  6.  Patient will improve Berg score to >/= 52/56 to improve safety and stability with ADLs in standing and reduce risk for falls. (MCID = 8 points)   Baseline: 42/56 Goal status: IN PROGRESS  7.  Patient will report >/= 80% on ABC scale to demonstrate improved balance confidence and decreased risk for falls. Baseline: 1060 / 1600 = 66.3% Goal status: IN PROGRESS  8. Patient will verbalize understanding of local Parkinson's disease community resources, including community fitness post d/c. Baseline:  Goal status: IN PROGRESS   PLAN: PT FREQUENCY: 2x/week  PT DURATION: 6 weeks  PLANNED INTERVENTIONS: Therapeutic exercises, Therapeutic activity, Neuromuscular re-education, Balance training, Gait training, Patient/Family education, Self Care, Joint mobilization, Stair training, DME instructions, Manual therapy, and Re-evaluation  PLAN FOR NEXT SESSION:  initiate goal assessment to determine readiness to transition to HEP at end of POC vs need for recert - assess outdoor mobility and gait; progress strengthening and balance activities; review and progress PWR! Moves; review/update HEP as indicated   Percival Spanish, PT 11/18/2021,  12:08 PM

## 2021-11-22 ENCOUNTER — Encounter: Payer: Self-pay | Admitting: Physical Therapy

## 2021-11-22 ENCOUNTER — Ambulatory Visit: Payer: Medicare Other | Admitting: Physical Therapy

## 2021-11-22 DIAGNOSIS — R2681 Unsteadiness on feet: Secondary | ICD-10-CM

## 2021-11-22 DIAGNOSIS — R2689 Other abnormalities of gait and mobility: Secondary | ICD-10-CM

## 2021-11-22 DIAGNOSIS — M6281 Muscle weakness (generalized): Secondary | ICD-10-CM

## 2021-11-22 DIAGNOSIS — R293 Abnormal posture: Secondary | ICD-10-CM

## 2021-11-22 NOTE — Therapy (Signed)
OUTPATIENT PHYSICAL THERAPY TREATMENT    Patient Name: Victor Osborne MRN: [id number] DOB:March 30, 1932, 88 y.o., male Today's Date: 11/22/2021      PT End of Session - 11/22/21 1108     Visit Number 11    Date for PT Re-Evaluation 11/25/21    Authorization Type Medicare & [employer]    Progress Note Due on Visit 12   PN completed on visit #6 - 11/05/21   PT Start Time 1108    PT Stop Time 0355    PT Time Calculation (min) 45 min    Activity Tolerance Patient tolerated treatment well    Behavior During Therapy WFL for tasks assessed/performed                Past Medical History:  Diagnosis Date   Bipolar 1 disorder ([place])    COPD (chronic obstructive pulmonary disease) ([place])    Depression    First degree AV block    Hypertension    pt denies but was in medical record   Hypoglycemia, unspecified    Myalgia and myositis, unspecified    Obesity    Pneumonia 1970   Skin cancer (melanoma) ([place])    Thyroid disease    Unspecified hypothyroidism    UPJ obstruction, congenital    Past Surgical History:  Procedure Laterality Date   APPENDECTOMY  1939   malignant melanoma removed from right forehead  2008   PERMANENT PACEMAKER INSERTION N/A 06/19/2013   Procedure: PERMANENT PACEMAKER INSERTION;  Surgeon: Evans Lance, MD;  Location: [facility] CATH LAB;  Service: Cardiovascular;  Laterality: N/A;   right big toe  surgery  2009   TRANSURETHRAL RESECTION OF PROSTATE  02/04/2011   Procedure: TRANSURETHRAL RESECTION OF THE PROSTATE (TURP);  Surgeon: Dutch Gray, MD;  Location: [facility];  Service: Urology;  Laterality: N/A;   URETHRA SURGERY  2010   reconstruction   Patient Active Problem List   Diagnosis Date Noted   History of neoplasm 04/12/2021   Melanocytic nevi of trunk 04/12/2021   Other seborrheic keratosis 04/12/2021   Cerebral embolism with cerebral infarction 03/31/2021   Left leg weakness 03/30/2021   Chronic post-traumatic stress disorder 12/05/2017    Bipolar I disorder, current or most recent episode depressed, in partial remission ([place]) 12/05/2017   CHB (complete heart block) ([place]) 01/27/2017   HTN (hypertension) 01/27/2017   Hypothyroidism 10/11/2016   Parkinson disease 09/08/2016   Pacemaker 09/24/2013   SSS (sick sinus syndrome) ([place]) 06/19/2013   First degree AV block 04/09/2013   Allergic rhinitis 03/17/2013   Obstructive sleep apnea 12/18/2006   COPD mixed type ([place]) 12/18/2006   History of malignant melanoma of skin 12/18/2006   Diabetes mellitus ([place]) 12/18/2006    PCP: [name], FNP   REFERRING PROVIDER: Ludwig Clarks, DO   REFERRING DIAG: G20 (ICD-10-CM) - Parkinson disease ([place])  THERAPY DIAG:  Other abnormalities of gait and mobility  Unsteadiness on feet  Muscle weakness (generalized)  Abnormal posture  RATIONALE FOR EVALUATION AND TREATMENT: Rehabilitation  ONSET DATE: Initial PD diagnosis ~16 yrs ago  NEXT MD VISIT: 04/07/2022   SUBJECTIVE:      SUBJECTIVE STATEMENT: Pt w/o concerns today.  PAIN:  Are you having pain? No   PERTINENT HISTORY:  PPM 2 1st degree heart block & SA node dysfunction, possible CVA (ACA infarct), orthostatic hypotension, OSA, COPD, hypothyroidism, chronic PTSD, bipolar I disorder,  decreased energy/endurance since COVID-19 in ~March 2022    PRECAUTIONS: Fall and  ICD/Pacemaker  WEIGHT BEARING RESTRICTIONS No  FALLS: Has patient fallen in last 6 months? No  LIVING ENVIRONMENT: Lives with: lives alone and with his dog Lives in: [facility] living facility (3rd floor) Stairs:  Elevator Has following equipment at home: Single point cane and hiking pole  OCCUPATION: Retired  PLOF: Independent and Leisure: being outdoors - walking his dog, and stays active most of the day     PATIENT GOALS: "Gain more strength in my legs and gain more balance overall."   OBJECTIVE:   DIAGNOSTIC FINDINGS:  N/A  COGNITION: Overall cognitive status: Within  functional limits for tasks assessed   SENSATION: WFL  COORDINATION: WFL for LE  MUSCLE TONE: WNL  MUSCLE LENGTH: Hamstrings: mod/severe tight B ITB: mod tight B Piriformis: mod tight B Hip IR: mod/severe tight B Hip flexors: mod/severe tight B Quads: mod tight R>L Heelcord: NT  POSTURE:  rounded shoulders, forward head, increased thoracic kyphosis, and flexed trunk   LOWER EXTREMITY ROM:    WFL other than limitations due to muscle tightness as indicated   LOWER EXTREMITY MMT:    MMT Right Eval Left Eval Right 11/22/21 Left 11/22/21  Hip flexion 4+ _0 Hip extension 4 Limited ROM 4+ Limited ROM 4 Limited ROM 4+   Hip abduction 4+ 4+ 4+ 4+  Hip adduction 4+ 4 4+ 4  Hip internal rotation 4+ 4+ 5 5  Hip external rotation 4+ _1 Knee flexion _2 Knee extension _3 Ankle dorsiflexion 4+ 4 5 4+  Ankle plantarflexion 5 5- 5 5  Ankle inversion      Ankle eversion      (Blank rows = not tested)  BED MOBILITY:  Sit to supine SBA Supine to sit SBA Rolling to Right SBA Rolling to Left SBA  TRANSFERS: Assistive device utilized: None  Sit to stand: Complete Independence and Modified independence - UE assist necessary from lower surface height Stand to sit: Modified independence - uncontrolled descent w/o UE assist Chair to chair: Modified independence Floor:  NT  GAIT: Gait pattern: step through pattern, decreased arm swing- Left, decreased step length- Right, decreased trunk rotation, trunk flexed, and wide BOS Distance walked: 80 Assistive device utilized: Environmental consultant - 4 wheeled and None - 10MWT/balance assessments completed w/o AD Level of assistance: Modified independence Comments: Gait speed = 3.90 ft/sec (decreased from 4.34 ft/sec as of prior discharge from PT)  RAMP: Level of Assistance:  NT Assistive device utilized:  NT Ramp Comments:   CURB:  Level of Assistance:  NT Assistive device utilized:  NT Curb Comments:   STAIRS:  (10/20/21)  Level of Assistance: SBA  Stair Negotiation Technique: Alternating Pattern  with Single Rail on Right  Number of Stairs: 14   Height of Stairs: 7  Comments: foot placement on steps with significant overhang of heel putting him at risk for posterior LOB    FUNCTIONAL TESTS:  5 times sit to stand: 19.32 sec, >16 sec indicates a fall risk for patients with PD Timed up and go (TUG): Normal = 11.06 sec; Manual = 14.04 sec; Cognitive = 16.59 sec (difficulty with cognitive task - Cognitive TUG > 15 sec = high fall risk in community dwelling older adults) 10 meter walk test: 8.41 sec (no AD); Gait speed = 3.90 ft/sec Berg Balance Scale: 42/56; 37-45 = significant risk for falls (>80%)  Functional gait assessment: 17/30; < 19 = high risk fall (10/20/21)  Scores  as of discharge from last PT episode - December 2022: 5 times sit to stand: 13.75 sec Timed up and go (TUG): Normal = 9.38 sec; Manual = NT; Cognitive = 11.88 sec 10 meter walk test: 7.56 sec (no AD); Gait speed = 4.34 ft/sec Berg: 53/56; 52-55 lower (> 25%) fall risk Functional gait assessment: 27/30; 25-28 = low risk fall   PATIENT SURVEYS:  ABC scale 1060 / 1600 = 66.3%   TODAY'S TREATMENT:  11/22/21 THERAPEUTIC EXERCISE: to improve flexibility, strength and mobility.  Verbal and tactile cues throughout for technique.  NuStep - L6 x 5 min  THERAPEUTIC ACTIVITIES: LE MMT  GAIT TRAINING: To normalize gait pattern and improve safety with SPC . Gait 1000 ft with SPC on various surfaces including sidewalks, curb, grass, gravel and pebbles/stones Stairs: Level of Assistance: Modified independence Stair Negotiation Technique: Alternating Pattern  with Single Rail on Right Number of Stairs: 2 x 14  Height of Stairs: 7  Comments: good reciprocal pattern but cues to ensure more of foot aligned on step as pt only placing forefoot on step on ascent   NEUROMUSCULAR RE-EDUCATION: To improve posture, balance, coordination,  reduce fall risk, amplitude of movement, speed of movement to reduce bradykinesia, and reduce rigidity. Standing on Airex pad: PWR! Sit to stand x 10 from Airex pad on mat table Standing PWR! Up, Rock & Twist x 10 reps each Alt fwd step + contralateral reach to cone atop 36" FR x 10 Alt fwd step to Airex pad + contralateral reach to cone atop 36" FR x 10   11/18/21 THERAPEUTIC EXERCISE: to improve flexibility, strength and mobility.  Verbal and tactile cues throughout for technique.  NuStep - L6 x 5 min  THERAPEUTIC ACTIVITIES: Sit <> stand utilizing PWR! Up for forward momentum x 10 Rolling and supine to sit using combination of supine PWR! Moves to facilitate momentum to increase ease of transition to sitting x ~10 reps  NEUROMUSCULAR RE-EDUCATION: To improve coordination, amplitude of movement, speed of movement to reduce bradykinesia, and reduce rigidity. Supine PWR! Moves (Up, Rock, Twist, Step) x 10 R/L step + 1/4 turn x 10 - cues for good foot clearance but pt reporting increased LOB with high step   11/15/21 THERAPEUTIC EXERCISE: to improve flexibility, strength and mobility.  Verbal and tactile cues throughout for technique.  NuStep - L6 x 10 min  NEUROMUSCULAR RE-EDUCATION: To improve posture, balance, proprioception, coordination, reduce fall risk, amplitude of movement, speed of movement to reduce bradykinesia, and reduce rigidity. R/L SLS + 1/2 circle star taps to colored dots x 4 revolutions Standing on Airex pad: Lateral weight shifts x 20 Heel-toe weight shifts x 20 Stepping in place x 2 min - initially with light B HHA, progressing to single HHA then briefly unsupported  Standing PWR! Up, Rock & Twist x 10 reps each PWR! Step and turn x 5 - pt having difficulty with foot clearance, therefore transitioned to  High step 1/4 turn x 10 (moving from edge of mat table to corner to better pt confidence)   PATIENT EDUCATION: Education details: progress with PT Person  educated: Patient Education method: Explanation Education comprehension: verbalized understanding   HOME EXERCISE PROGRAM: Access Code: [email or web address] URL: [email or web address] Date: 11/05/2021 Prepared by: Annie Paras  Exercises - Seated Hamstring Stretch with Strap  - 2 x daily - 7 x weekly - 3 reps - 30 sec hold - Seated Figure 4 Piriformis Stretch  - 2 x daily - 7 x weekly - 3  reps - 30 sec hold - Standing Hip Flexor Stretch  - 2 x daily - 7 x weekly - 2 sets - 10 reps - 30 sec hold - Seated March with Resistance  - 1 x daily - 7 x weekly - 2 sets - 10 reps - 3 sec hold - Seated Isometric Hip Abduction with Resistance  - 1 x daily - 7 x weekly - 2 sets - 10 reps - 3 sec hold - Sit to Stand Without Arm Support  - 1 x daily - 7 x weekly - 2 sets - 10 reps - Seated Ankle Dorsiflexion with Resistance  - 1 x daily - 7 x weekly - 2 sets - 10 reps - 3 sec hold - Standing Hip Abduction with Resistance at Ankles and Counter Support  - 1 x daily - 3 x weekly - 2 sets - 10 reps - 3 sec hold - Standing Hip Extension with Resistance at Ankles and Counter Support  - 1 x daily - 3 x weekly - 2 sets - 10 reps - 3 sec hold - Marching with Resistance  - 1 x daily - 3 x weekly - 2 sets - 10 reps - 3 sec hold  PWR! Moves: - Seated - Standing - Supine  Patient Education - Check for Safety   ASSESSMENT:  CLINICAL IMPRESSION: Leslee Haueter" continues to express desire to continue with PT noting ongoing balance concerns - states his dog almost pulled him off-balance yesterday while they were walking. Gait assessed on a variety of surfaces indoors and outdoors with no LOB or unsteadiness observed but cues necessary to ensure more of his foot centered on the step while ascending steps. His strength has improved but mild weakness still evident proximally. He remains challenged with dynamic stepping activities both with coordination of movement patterns as well as balance during activities. Will  plan for completion of goal assessment next visit, including standardized balance testing, to to determine need for recert vs readiness for transition to HEP.  OBJECTIVE IMPAIRMENTS: Abnormal gait, decreased activity tolerance, decreased balance, decreased endurance, decreased mobility, difficulty walking, decreased strength, decreased safety awareness, impaired perceived functional ability, impaired flexibility, improper body mechanics, and postural dysfunction.   ACTIVITY LIMITATIONS: carrying, lifting, standing, squatting, stairs, transfers, bed mobility, reach over head, locomotion level, and caring for others  PARTICIPATION LIMITATIONS: meal prep, cleaning, laundry, driving, shopping, and community activity  PERSONAL FACTORS: Age, Fitness, Past/current experiences, Time since onset of injury/illness/exacerbation, and 3+ comorbidities: PPM 2 1st degree heart block & SA node dysfunction, possible CVA (ACA infarct), orthostatic hypotension, OSA, COPD, hypothyroidism, chronic PTSD, bipolar I disorder,  decreased energy/endurance since COVID-19 in ~March 2022   are also affecting patient's functional outcome.   REHAB POTENTIAL: Good  CLINICAL DECISION MAKING: Evolving/moderate complexity  EVALUATION COMPLEXITY: Moderate   GOALS: Goals reviewed with patient? Yes  SHORT TERM GOALS: Target date: 11/04/2021   Patient will be independent with initial HEP. Baseline:  Goal status: MET  11/02/21  2.  Patient will demonstrate decreased fall risk by scoring </= 15 sec on cognitive TUG. Baseline: 16.59 sec Goal status: MET  11/05/21 - Cognitive TUG = 13.57 sec  3.  Patient will be educated on strategies to decrease risk of falls.  Baseline:  Goal status: MET  11/05/21  LONG TERM GOALS: Target date: 11/25/2021   Patient will be independent with ongoing/advanced HEP for self-management at home incorporating PWR! Moves as indicated .  Baseline:  Goal status: IN PROGRESS  2.  Patient will  ambulate at least 1000 ft with LRAD including outdoor and unlevel surfaces, independently with no LOB, for improved community gait. Baseline:  Goal status: MET  11/22/21  3.  Patient will be able to step up/down curb safely with LRAD for safety with community ambulation.  Baseline:  Goal status: MET  11/22/21   4.  Patient will improve 5x STS time to </= 16 seconds to demonstrate improved functional strength and transfer efficiency. Baseline: 19.32 sec Goal status: IN PROGRESS  5.  Patient will demonstrate at least 25/30 on FGA to improve gait stability and reduce risk for falls. (MCID = 4 points) Baseline: TBD Goal status: IN PROGRESS  6.  Patient will improve Berg score to >/= 52/56 to improve safety and stability with ADLs in standing and reduce risk for falls. (MCID = 8 points)   Baseline: 42/56 Goal status: IN PROGRESS  7.  Patient will report >/= 80% on ABC scale to demonstrate improved balance confidence and decreased risk for falls. Baseline: 1060 / 1600 = 66.3% Goal status: IN PROGRESS  8. Patient will verbalize understanding of local Parkinson's disease community resources, including community fitness post d/c. Baseline:  Goal status: MET  11/22/21   PLAN: PT FREQUENCY: 2x/week  PT DURATION: 6 weeks  PLANNED INTERVENTIONS: Therapeutic exercises, Therapeutic activity, Neuromuscular re-education, Balance training, Gait training, Patient/Family education, Self Care, Joint mobilization, Stair training, DME instructions, Manual therapy, and Re-evaluation  PLAN FOR NEXT SESSION:  complete goal assessment to determine readiness to transition to HEP at end of POC vs need for recert; progress strengthening and balance activities; review and progress PWR! Moves; review/update HEP as indicated   Percival Spanish, PT 11/22/2021, 12:19 PM

## 2021-11-25 ENCOUNTER — Encounter: Payer: Self-pay | Admitting: Physical Therapy

## 2021-11-25 ENCOUNTER — Ambulatory Visit: Payer: Medicare Other | Admitting: Physical Therapy

## 2021-11-25 DIAGNOSIS — M6281 Muscle weakness (generalized): Secondary | ICD-10-CM

## 2021-11-25 DIAGNOSIS — R293 Abnormal posture: Secondary | ICD-10-CM

## 2021-11-25 DIAGNOSIS — R2681 Unsteadiness on feet: Secondary | ICD-10-CM

## 2021-11-25 DIAGNOSIS — R2689 Other abnormalities of gait and mobility: Secondary | ICD-10-CM

## 2021-11-25 NOTE — Therapy (Signed)
OUTPATIENT PHYSICAL THERAPY TREATMENT / RE-CERTIFICATION   Patient Name: Victor Osborne MRN: [id number] DOB:1932-04-27, 88 y.o., male Today's Date: 11/25/2021  Progress Note  Reporting Period 11/05/2021 to 11/25/2021  See note below for Objective Data and Assessment of Progress/Goals.      PT End of Session - 11/25/21 1102     Visit Number 12    Date for PT Re-Evaluation 12/23/21    Authorization Type Medicare & [employer]    Progress Note Due on Visit 20   Recert completed on visit #12 - 11/25/21   PT Start Time 1102    PT Stop Time 1200    PT Time Calculation (min) 58 min    Activity Tolerance Patient tolerated treatment well    Behavior During Therapy WFL for tasks assessed/performed                Past Medical History:  Diagnosis Date   Bipolar 1 disorder ([place])    COPD (chronic obstructive pulmonary disease) ([place])    Depression    First degree AV block    Hypertension    pt denies but was in medical record   Hypoglycemia, unspecified    Myalgia and myositis, unspecified    Obesity    Pneumonia 1970   Skin cancer (melanoma) ([place])    Thyroid disease    Unspecified hypothyroidism    UPJ obstruction, congenital    Past Surgical History:  Procedure Laterality Date   APPENDECTOMY  1939   malignant melanoma removed from right forehead  2008   PERMANENT PACEMAKER INSERTION N/A 06/19/2013   Procedure: PERMANENT PACEMAKER INSERTION;  Surgeon: Evans Lance, MD;  Location: [facility] CATH LAB;  Service: Cardiovascular;  Laterality: N/A;   right big toe  surgery  2009   TRANSURETHRAL RESECTION OF PROSTATE  02/04/2011   Procedure: TRANSURETHRAL RESECTION OF THE PROSTATE (TURP);  Surgeon: Dutch Gray, MD;  Location: [facility] ORS;  Service: Urology;  Laterality: N/A;   URETHRA SURGERY  2010   reconstruction   Patient Active Problem List   Diagnosis Date Noted   History of neoplasm 04/12/2021   Melanocytic nevi of trunk 04/12/2021   Other seborrheic keratosis  04/12/2021   Cerebral embolism with cerebral infarction 03/31/2021   Left leg weakness 03/30/2021   Chronic post-traumatic stress disorder 12/05/2017   Bipolar I disorder, current or most recent episode depressed, in partial remission ([place]) 12/05/2017   CHB (complete heart block) ([place]) 01/27/2017   HTN (hypertension) 01/27/2017   Hypothyroidism 10/11/2016   Parkinson disease 09/08/2016   Pacemaker 09/24/2013   SSS (sick sinus syndrome) ([place]) 06/19/2013   First degree AV block 04/09/2013   Allergic rhinitis 03/17/2013   Obstructive sleep apnea 12/18/2006   COPD mixed type ([place]) 12/18/2006   History of malignant melanoma of skin 12/18/2006   Diabetes mellitus ([place]) 12/18/2006    PCP: [name], FNP   REFERRING PROVIDER: Ludwig Clarks, DO   REFERRING DIAG: G20 (ICD-10-CM) - Parkinson disease ([place])  THERAPY DIAG:  Other abnormalities of gait and mobility  Unsteadiness on feet  Muscle weakness (generalized)  Abnormal posture  RATIONALE FOR EVALUATION AND TREATMENT: Rehabilitation  ONSET DATE: Initial PD diagnosis ~16 yrs ago  NEXT MD VISIT: 04/07/2022   SUBJECTIVE:      SUBJECTIVE STATEMENT: Pt w/o concerns today.  PAIN:  Are you having pain? No   PERTINENT HISTORY:  PPM 2 1st degree heart block & SA node dysfunction, possible CVA (ACA infarct), orthostatic hypotension, OSA,  COPD, hypothyroidism, chronic PTSD, bipolar I disorder,  decreased energy/endurance since COVID-19 in ~March 2022    PRECAUTIONS: Fall and ICD/Pacemaker  WEIGHT BEARING RESTRICTIONS No  FALLS: Has patient fallen in last 6 months? No  LIVING ENVIRONMENT: Lives with: lives alone and with his dog Lives in: [facility] (3rd floor) Stairs:  Elevator Has following equipment at home: Single point cane and hiking pole  OCCUPATION: Retired  PLOF: Independent and Leisure: being outdoors - walking his dog, and stays active most of the day     PATIENT GOALS: "Gain  more strength in my legs and gain more balance overall."   OBJECTIVE:   DIAGNOSTIC FINDINGS:  N/A  COGNITION: Overall cognitive status: Within functional limits for tasks assessed   SENSATION: WFL  COORDINATION: WFL for LE  MUSCLE TONE: WNL  MUSCLE LENGTH: Hamstrings: mod/severe tight B ITB: mod tight B Piriformis: mod tight B Hip IR: mod/severe tight B Hip flexors: mod/severe tight B Quads: mod tight R>L Heelcord: NT  POSTURE:  rounded shoulders, forward head, increased thoracic kyphosis, and flexed trunk   LOWER EXTREMITY ROM:    WFL other than limitations due to muscle tightness as indicated   LOWER EXTREMITY MMT:    MMT Right Eval Left Eval Right 11/22/21 Left 11/22/21  Hip flexion 4+ _0 Hip extension 4 Limited ROM 4+ Limited ROM 4 Limited ROM 4+   Hip abduction 4+ 4+ 4+ 4+  Hip adduction 4+ 4 4+ 4  Hip internal rotation 4+ 4+ 5 5  Hip external rotation 4+ _1 Knee flexion _2 Knee extension _3 Ankle dorsiflexion 4+ 4 5 4+  Ankle plantarflexion 5 5- 5 5  Ankle inversion      Ankle eversion      (Blank rows = not tested)  BED MOBILITY:  Sit to supine SBA Supine to sit SBA Rolling to Right SBA Rolling to Left SBA  TRANSFERS: Assistive device utilized: None  Sit to stand: Complete Independence and Modified independence - UE assist necessary from lower surface height Stand to sit: Modified independence - uncontrolled descent w/o UE assist Chair to chair: Modified independence Floor:  NT  GAIT: Gait pattern: step through pattern, decreased arm swing- Left, decreased step length- Right, decreased trunk rotation, trunk flexed, and wide BOS Distance walked: 80 Assistive device utilized: Environmental consultant - 4 wheeled and None - 10MWT/balance assessments completed w/o AD Level of assistance: Modified independence Comments: Gait speed = 3.90 ft/sec (decreased from 4.34 ft/sec as of prior discharge from PT)  RAMP: Level of Assistance:   NT Assistive device utilized:  NT Ramp Comments:   CURB:  Level of Assistance:  NT Assistive device utilized:  NT Curb Comments:   STAIRS: (10/20/21)  Level of Assistance: SBA  Stair Negotiation Technique: Alternating Pattern  with Single Rail on Right  Number of Stairs: 14   Height of Stairs: 7  Comments: foot placement on steps with significant overhang of heel putting him at risk for posterior LOB    FUNCTIONAL TESTS:  5 times sit to stand: 19.32 sec, >16 sec indicates a fall risk for patients with PD Timed up and go (TUG): Normal = 11.06 sec; Manual = 14.04 sec; Cognitive = 16.59 sec (difficulty with cognitive task - Cognitive TUG > 15 sec = high fall risk in community dwelling older adults) 10 meter walk test: 8.41 sec (no AD); Gait speed = 3.90 ft/sec Berg Balance Scale: 42/56;  37-45 = significant risk for falls (>80%)  Functional gait assessment: 17/30; < 19 = high risk fall (10/20/21)  Scores as of discharge from last PT episode - December 2022: 5 times sit to stand: 13.75 sec Timed up and go (TUG): Normal = 9.38 sec; Manual = NT; Cognitive = 11.88 sec 10 meter walk test: 7.56 sec (no AD); Gait speed = 4.34 ft/sec Berg: 53/56; 52-55 lower (> 25%) fall risk Functional gait assessment: 27/30; 25-28 = low risk fall   PATIENT SURVEYS:  ABC scale 1060 / 1600 = 66.3%   TODAY'S TREATMENT:  11/25/21 THERAPEUTIC EXERCISE: to improve flexibility, strength and mobility.  Verbal and tactile cues throughout for technique.  NuStep - L6 x 6 min  THERAPEUTIC ACTIVITIES: ABC Scale: 1150 / 1600 = 71.9% 5xSTS: 13.56 sec TUG: Normal = 9.96 sec; Manual = 9.18 sec; Cognitive = 10.84 sec (difficulty with cognitive task) 10MWT: 7.72 sec (no AD); Gait speed = 4.25 ft/sec Berg Balance Scale: 50/56; 46-51 moderate risk for falls (>50%)  FGA: 25/30; 25-28 = low risk fall    11/22/21 THERAPEUTIC EXERCISE: to improve flexibility, strength and mobility.  Verbal and tactile cues throughout for  technique.  NuStep - L6 x 5 min  THERAPEUTIC ACTIVITIES: LE MMT  GAIT TRAINING: To normalize gait pattern and improve safety with SPC . Gait 1000 ft with SPC on various surfaces including sidewalks, curb, grass, gravel and pebbles/stones Stairs: Level of Assistance: Modified independence Stair Negotiation Technique: Alternating Pattern  with Single Rail on Right Number of Stairs: 2 x 14  Height of Stairs: 7  Comments: good reciprocal pattern but cues to ensure more of foot aligned on step as pt only placing forefoot on step on ascent  NEUROMUSCULAR RE-EDUCATION: To improve posture, balance, coordination, reduce fall risk, amplitude of movement, speed of movement to reduce bradykinesia, and reduce rigidity. Standing on Airex pad: PWR! Sit to stand x 10 from Airex pad on mat table Standing PWR! Up, Rock & Twist x 10 reps each Alt fwd step + contralateral reach to cone atop 36" FR x 10 Alt fwd step to Airex pad + contralateral reach to cone atop 36" FR x 10   11/18/21 THERAPEUTIC EXERCISE: to improve flexibility, strength and mobility.  Verbal and tactile cues throughout for technique.  NuStep - L6 x 5 min  THERAPEUTIC ACTIVITIES: Sit <> stand utilizing PWR! Up for forward momentum x 10 Rolling and supine to sit using combination of supine PWR! Moves to facilitate momentum to increase ease of transition to sitting x ~10 reps  NEUROMUSCULAR RE-EDUCATION: To improve coordination, amplitude of movement, speed of movement to reduce bradykinesia, and reduce rigidity. Supine PWR! Moves (Up, Rock, Twist, Step) x 10 R/L step + 1/4 turn x 10 - cues for good foot clearance but pt reporting increased LOB with high step   PATIENT EDUCATION: Education details: progress with PT and ongoing PT POC Person educated: Patient Education method: Explanation Education comprehension: verbalized understanding   HOME EXERCISE PROGRAM: Access Code: [email or web address] URL:  [email or web address] Date: 11/05/2021 Prepared by: Annie Paras  Exercises - Seated Hamstring Stretch with Strap  - 2 x daily - 7 x weekly - 3 reps - 30 sec hold - Seated Figure 4 Piriformis Stretch  - 2 x daily - 7 x weekly - 3 reps - 30 sec hold - Standing Hip Flexor Stretch  - 2 x daily - 7 x weekly - 2 sets - 10 reps - 30 sec hold - Seated March with  Resistance  - 1 x daily - 7 x weekly - 2 sets - 10 reps - 3 sec hold - Seated Isometric Hip Abduction with Resistance  - 1 x daily - 7 x weekly - 2 sets - 10 reps - 3 sec hold - Sit to Stand Without Arm Support  - 1 x daily - 7 x weekly - 2 sets - 10 reps - Seated Ankle Dorsiflexion with Resistance  - 1 x daily - 7 x weekly - 2 sets - 10 reps - 3 sec hold - Standing Hip Abduction with Resistance at Ankles and Counter Support  - 1 x daily - 3 x weekly - 2 sets - 10 reps - 3 sec hold - Standing Hip Extension with Resistance at Ankles and Counter Support  - 1 x daily - 3 x weekly - 2 sets - 10 reps - 3 sec hold - Marching with Resistance  - 1 x daily - 3 x weekly - 2 sets - 10 reps - 3 sec hold  PWR! Moves: - Seated - Standing - Supine  Patient Education - Check for Safety   ASSESSMENT:  CLINICAL IMPRESSION: Victor Osborne" has demonstrated good progress with PT, with gains noted in overall strength and balance along with decreasing reliance on assistive devices.  He is more routinely ambulating with just a single-point cane, although does still use his rollator when taking his dog for a walk.  Scores have progressed on all standardized balance tests with goals met for 5xSTS and FGA. TUG times now all within 1.5 sec and below the fall risk threshold. Victor Osborne has improved but remains 2 points below goal and Victor Osborne continues to note concerns with activities where he has to reach/look up or which require static/dynamic standing balance as well as activities where he needs to step backwards. His balance confidence is improving per the  ABC scale but remains shy of the goal value. Goals met for ambulation across various surfaces as well as negotiation of curbs.  Victor Osborne will continue to benefit from skilled physical therapy to address above deficits to further improve safety with household and community mobility, therefore will recommend recertification for an additional 2x/wk for up to 4 weeks.  OBJECTIVE IMPAIRMENTS: Abnormal gait, decreased activity tolerance, decreased balance, decreased endurance, decreased mobility, difficulty walking, decreased strength, decreased safety awareness, impaired perceived functional ability, impaired flexibility, improper body mechanics, and postural dysfunction.   ACTIVITY LIMITATIONS: carrying, lifting, standing, squatting, stairs, transfers, bed mobility, reach over head, locomotion level, and caring for others  PARTICIPATION LIMITATIONS: meal prep, cleaning, laundry, driving, shopping, and community activity  PERSONAL FACTORS: Age, Fitness, Past/current experiences, Time since onset of injury/illness/exacerbation, and 3+ comorbidities: PPM 2 1st degree heart block & SA node dysfunction, possible CVA (ACA infarct), orthostatic hypotension, OSA, COPD, hypothyroidism, chronic PTSD, bipolar I disorder,  decreased energy/endurance since COVID-19 in ~March 2022   are also affecting patient's functional outcome.   REHAB POTENTIAL: Good  CLINICAL DECISION MAKING: Evolving/moderate complexity  EVALUATION COMPLEXITY: Moderate   GOALS: Goals reviewed with patient? Yes  SHORT TERM GOALS: Target date: 11/04/2021   Patient will be independent with initial HEP. Baseline:  Goal status: MET  11/02/21  2.  Patient will demonstrate decreased fall risk by scoring </= 15 sec on cognitive TUG. Baseline: 16.59 sec Goal status: MET  11/05/21 - Cognitive TUG = 13.57 sec  3.  Patient will be educated on strategies to decrease risk of falls.  Baseline:  Goal status: MET  11/05/21  LONG TERM GOALS: Target  date: 11/25/2021, extended to 12/23/21  Patient will be independent with ongoing/advanced HEP for self-management at home incorporating PWR! Moves as indicated .  Baseline:  Goal status: IN PROGRESS  2.  Patient will ambulate at least 1000 ft with LRAD including outdoor and unlevel surfaces, independently with no LOB, for improved community gait. Baseline:  Goal status: MET  11/22/21  3.  Patient will be able to step up/down curb safely with LRAD for safety with community ambulation.  Baseline:  Goal status: MET  11/22/21   4.  Patient will improve 5x STS time to </= 16 seconds to demonstrate improved functional strength and transfer efficiency. Baseline: 19.32 sec Goal status: MET  11/25/21  5.  Patient will demonstrate at least 25/30 on FGA to improve gait stability and reduce risk for falls. (MCID = 4 points) Baseline: 17/30 Goal status: MET  11/25/21 - FGA = 25/30  6.  Patient will improve Berg score to >/= 52/56 to improve safety and stability with ADLs in standing and reduce risk for falls. (MCID = 8 points)   Baseline: 42/56 Goal status: IN PROGRESS  11/25/21 - Berg = 50/56  7.  Patient will report >/= 80% on ABC scale to demonstrate improved balance confidence and decreased risk for falls. Baseline: 1060 / 1600 = 66.3% Goal status: IN PROGRESS  11/25/21 - ABC scale improved to 71.9%  8. Patient will verbalize understanding of local Parkinson's disease community resources, including community fitness post d/c. Baseline:  Goal status: MET  11/22/21   PLAN: PT FREQUENCY: 2x/week  PT DURATION: 4 weeks  PLANNED INTERVENTIONS: Therapeutic exercises, Therapeutic activity, Neuromuscular re-education, Balance training, Gait training, Patient/Family education, Self Care, Joint mobilization, Stair training, DME instructions, Manual therapy, and Re-evaluation  PLAN FOR NEXT SESSION:  progress strengthening and balance activities - dynamic stepping activities, retro step/gait and unstable  surfaces; review and progress PWR! Moves; review/update HEP as indicated   Percival Spanish, PT 11/25/2021, 12:22 PM

## 2021-11-30 ENCOUNTER — Encounter: Payer: Self-pay | Admitting: Physical Therapy

## 2021-11-30 ENCOUNTER — Ambulatory Visit: Payer: Medicare Other | Admitting: Physical Therapy

## 2021-11-30 DIAGNOSIS — R2681 Unsteadiness on feet: Secondary | ICD-10-CM

## 2021-11-30 DIAGNOSIS — R2689 Other abnormalities of gait and mobility: Secondary | ICD-10-CM | POA: Diagnosis not present

## 2021-11-30 DIAGNOSIS — R293 Abnormal posture: Secondary | ICD-10-CM

## 2021-11-30 DIAGNOSIS — M6281 Muscle weakness (generalized): Secondary | ICD-10-CM

## 2021-11-30 NOTE — Therapy (Signed)
OUTPATIENT PHYSICAL THERAPY TREATMENT   Patient Name: Victor Osborne MRN: [id number] DOB:[birth year], 88 y.o., male Today's Date: 11/30/2021      PT End of Session - 11/30/21 1315     Visit Number 13    Date for PT Re-Evaluation 12/23/21    Authorization Type Medicare & [employer]    Progress Note Due on Visit 20   Recert completed on visit #12 - 11/25/21   PT Start Time 1315    PT Stop Time 1400    PT Time Calculation (min) 45 min    Activity Tolerance Patient tolerated treatment well    Behavior During Therapy WFL for tasks assessed/performed                Past Medical History:  Diagnosis Date   Bipolar 1 disorder ([place])    COPD (chronic obstructive pulmonary disease) ([place])    Depression    First degree AV block    Hypertension    pt denies but was in medical record   Hypoglycemia, unspecified    Myalgia and myositis, unspecified    Obesity    Pneumonia 1970   Skin cancer (melanoma) ([place])    Thyroid disease    Unspecified hypothyroidism    UPJ obstruction, congenital    Past Surgical History:  Procedure Laterality Date   APPENDECTOMY  1939   malignant melanoma removed from right forehead  2008   PERMANENT PACEMAKER INSERTION N/A 06/19/2013   Procedure: PERMANENT PACEMAKER INSERTION;  Surgeon: Evans Lance, MD;  Location: [facility] CATH LAB;  Service: Cardiovascular;  Laterality: N/A;   right big toe  surgery  2009   TRANSURETHRAL RESECTION OF PROSTATE  02/04/2011   Procedure: TRANSURETHRAL RESECTION OF THE PROSTATE (TURP);  Surgeon: Dutch Gray, MD;  Location: [facility] ORS;  Service: Urology;  Laterality: N/A;   URETHRA SURGERY  2010   reconstruction   Patient Active Problem List   Diagnosis Date Noted   History of neoplasm 04/12/2021   Melanocytic nevi of trunk 04/12/2021   Other seborrheic keratosis 04/12/2021   Cerebral embolism with cerebral infarction 03/31/2021   Left leg weakness 03/30/2021   Chronic post-traumatic stress disorder 12/05/2017    Bipolar I disorder, current or most recent episode depressed, in partial remission ([place]) 12/05/2017   CHB (complete heart block) ([place]) 01/27/2017   HTN (hypertension) 01/27/2017   Hypothyroidism 10/11/2016   Parkinson disease 09/08/2016   Pacemaker 09/24/2013   SSS (sick sinus syndrome) ([place]) 06/19/2013   First degree AV block 04/09/2013   Allergic rhinitis 03/17/2013   Obstructive sleep apnea 12/18/2006   COPD mixed type ([place]) 12/18/2006   History of malignant melanoma of skin 12/18/2006   Diabetes mellitus ([place]) 12/18/2006    PCP: [name], FNP   REFERRING PROVIDER: Ludwig Clarks, DO   REFERRING DIAG: G20 (ICD-10-CM) - Parkinson disease ([place])  THERAPY DIAG:  Other abnormalities of gait and mobility  Unsteadiness on feet  Muscle weakness (generalized)  Abnormal posture  RATIONALE FOR EVALUATION AND TREATMENT: Rehabilitation  ONSET DATE: Initial PD diagnosis ~16 yrs ago  NEXT MD VISIT: 04/07/2022   SUBJECTIVE:      SUBJECTIVE STATEMENT: Pt excited that he has another new great grandson today.   PAIN:  Are you having pain? No   PERTINENT HISTORY:  PPM 2 1st degree heart block & SA node dysfunction, possible CVA (ACA infarct), orthostatic hypotension, OSA, COPD, hypothyroidism, chronic PTSD, bipolar I disorder,  decreased energy/endurance since COVID-19 in ~March 2022  PRECAUTIONS: Fall and ICD/Pacemaker  WEIGHT BEARING RESTRICTIONS No  FALLS: Has patient fallen in last 6 months? No  LIVING ENVIRONMENT: Lives with: lives alone and with his dog Lives in: [facility] facility (3rd floor) Stairs:  Elevator Has following equipment at home: Single point cane and hiking pole  OCCUPATION: Retired  PLOF: Independent and Leisure: being outdoors - walking his dog, and stays active most of the day     PATIENT GOALS: "Gain more strength in my legs and gain more balance overall."   OBJECTIVE:   DIAGNOSTIC FINDINGS:   N/A  COGNITION: Overall cognitive status: Within functional limits for tasks assessed   SENSATION: WFL  COORDINATION: WFL for LE  MUSCLE TONE: WNL  MUSCLE LENGTH: Hamstrings: mod/severe tight B ITB: mod tight B Piriformis: mod tight B Hip IR: mod/severe tight B Hip flexors: mod/severe tight B Quads: mod tight R>L Heelcord: NT  POSTURE:  rounded shoulders, forward head, increased thoracic kyphosis, and flexed trunk   LOWER EXTREMITY ROM:    WFL other than limitations due to muscle tightness as indicated   LOWER EXTREMITY MMT:    MMT Right Eval Left Eval Right 11/22/21 Left 11/22/21  Hip flexion 4+ _0 Hip extension 4 Limited ROM 4+ Limited ROM 4 Limited ROM 4+   Hip abduction 4+ 4+ 4+ 4+  Hip adduction 4+ 4 4+ 4  Hip internal rotation 4+ 4+ 5 5  Hip external rotation 4+ _1 Knee flexion _2 Knee extension _3 Ankle dorsiflexion 4+ 4 5 4+  Ankle plantarflexion 5 5- 5 5  Ankle inversion      Ankle eversion      (Blank rows = not tested)  BED MOBILITY:  Sit to supine SBA Supine to sit SBA Rolling to Right SBA Rolling to Left SBA  TRANSFERS: Assistive device utilized: None  Sit to stand: Complete Independence and Modified independence - UE assist necessary from lower surface height Stand to sit: Modified independence - uncontrolled descent w/o UE assist Chair to chair: Modified independence Floor:  NT  GAIT: Gait pattern: step through pattern, decreased arm swing- Left, decreased step length- Right, decreased trunk rotation, trunk flexed, and wide BOS Distance walked: 80 Assistive device utilized: Environmental consultant - 4 wheeled and None - 10MWT/balance assessments completed w/o AD Level of assistance: Modified independence Comments: Gait speed = 3.90 ft/sec (decreased from 4.34 ft/sec as of prior discharge from PT)  RAMP: Level of Assistance:  NT Assistive device utilized:  NT Ramp Comments:   CURB:  Level of Assistance:  NT Assistive  device utilized:  NT Curb Comments:   STAIRS: (10/20/21)  Level of Assistance: SBA  Stair Negotiation Technique: Alternating Pattern  with Single Rail on Right  Number of Stairs: 14   Height of Stairs: 7  Comments: foot placement on steps with significant overhang of heel putting him at risk for posterior LOB    FUNCTIONAL TESTS:  5 times sit to stand: 19.32 sec, >16 sec indicates a fall risk for patients with PD Timed up and go (TUG): Normal = 11.06 sec; Manual = 14.04 sec; Cognitive = 16.59 sec (difficulty with cognitive task - Cognitive TUG > 15 sec = high fall risk in community dwelling older adults) 10 meter walk test: 8.41 sec (no AD); Gait speed = 3.90 ft/sec Berg Balance Scale: 42/56; 37-45 = significant risk for falls (>80%)  Functional gait assessment: 17/30; < 19 = high risk fall (  10/20/21)  Scores as of discharge from last PT episode - December 2022: 5 times sit to stand: 13.75 sec Timed up and go (TUG): Normal = 9.38 sec; Manual = NT; Cognitive = 11.88 sec 10 meter walk test: 7.56 sec (no AD); Gait speed = 4.34 ft/sec Berg: 53/56; 52-55 lower (> 25%) fall risk Functional gait assessment: 27/30; 25-28 = low risk fall   PATIENT SURVEYS:  ABC scale 1060 / 1600 = 66.3%   TODAY'S TREATMENT:  11/30/21 THERAPEUTIC EXERCISE: to improve flexibility, strength and mobility.  Verbal and tactile cues throughout for technique.  NuStep - L6 x 6 min L knee blue TB TKE 2 x 10 - cues to slow pace and hold 5" on TKE  NEUROMUSCULAR RE-EDUCATION: To improve posture, balance, proprioception, coordination, reduce fall risk, amplitude of movement, and reduce rigidity. Alt fwd step + contralateral reach to cone atop 36" FR x 10 Tandem stance 2 x 30" - 1 rep with R & L foot forward Tandem gait 10 ft along counter 2 x 10 ft with light touch on counter, 2 x 10 ft with hand hovering over counter Reverse tandem gait x 10 ft with light touch on counter Retro gait 2 x 10 ft with light touch on  counter PWR! Step backwards R x 10, L x 5 - standing close to counter but no UE support necessary, SBA of PT   11/25/21 THERAPEUTIC EXERCISE: to improve flexibility, strength and mobility.  Verbal and tactile cues throughout for technique.  NuStep - L6 x 6 min  THERAPEUTIC ACTIVITIES: ABC Scale: 1150 / 1600 = 71.9% 5xSTS: 13.56 sec TUG: Normal = 9.96 sec; Manual = 9.18 sec; Cognitive = 10.84 sec (difficulty with cognitive task) 10MWT: 7.72 sec (no AD); Gait speed = 4.25 ft/sec Berg Balance Scale: 50/56; 46-51 moderate risk for falls (>50%)  FGA: 25/30; 25-28 = low risk fall    11/22/21 THERAPEUTIC EXERCISE: to improve flexibility, strength and mobility.  Verbal and tactile cues throughout for technique.  NuStep - L6 x 5 min  THERAPEUTIC ACTIVITIES: LE MMT  GAIT TRAINING: To normalize gait pattern and improve safety with SPC . Gait 1000 ft with SPC on various surfaces including sidewalks, curb, grass, gravel and pebbles/stones Stairs: Level of Assistance: Modified independence Stair Negotiation Technique: Alternating Pattern  with Single Rail on Right Number of Stairs: 2 x 14  Height of Stairs: 7  Comments: good reciprocal pattern but cues to ensure more of foot aligned on step as pt only placing forefoot on step on ascent  NEUROMUSCULAR RE-EDUCATION: To improve posture, balance, coordination, reduce fall risk, amplitude of movement, speed of movement to reduce bradykinesia, and reduce rigidity. Standing on Airex pad: PWR! Sit to stand x 10 from Airex pad on mat table Standing PWR! Up, Rock & Twist x 10 reps each Alt fwd step + contralateral reach to cone atop 36" FR x 10 Alt fwd step to Airex pad + contralateral reach to cone atop 36" FR x 10   11/18/21 THERAPEUTIC EXERCISE: to improve flexibility, strength and mobility.  Verbal and tactile cues throughout for technique.  NuStep - L6 x 5 min  THERAPEUTIC ACTIVITIES: Sit <> stand utilizing PWR! Up for forward momentum x  10 Rolling and supine to sit using combination of supine PWR! Moves to facilitate momentum to increase ease of transition to sitting x ~10 reps  NEUROMUSCULAR RE-EDUCATION: To improve coordination, amplitude of movement, speed of movement to reduce bradykinesia, and reduce rigidity. Supine PWR! Moves (Up,  Rock, Twist, Step) x 10 R/L step + 1/4 turn x 10 - cues for good foot clearance but pt reporting increased LOB with high step   PATIENT EDUCATION: Education details: progress with PT and ongoing PT POC Person educated: Patient Education method: Explanation Education comprehension: verbalized understanding   HOME EXERCISE PROGRAM: Access Code: [email or web address] URL: [email or web address] Date: 11/30/2021 Prepared by: Annie Paras  Exercises - Seated Hamstring Stretch with Strap  - 2 x daily - 7 x weekly - 3 reps - 30 sec hold - Seated Figure 4 Piriformis Stretch  - 2 x daily - 7 x weekly - 3 reps - 30 sec hold - Standing Hip Flexor Stretch  - 2 x daily - 7 x weekly - 2 sets - 10 reps - 30 sec hold - Seated March with Resistance  - 1 x daily - 7 x weekly - 2 sets - 10 reps - 3 sec hold - Seated Isometric Hip Abduction with Resistance  - 1 x daily - 7 x weekly - 2 sets - 10 reps - 3 sec hold - Sit to Stand Without Arm Support  - 1 x daily - 7 x weekly - 2 sets - 10 reps - Seated Ankle Dorsiflexion with Resistance  - 1 x daily - 7 x weekly - 2 sets - 10 reps - 3 sec hold - Standing Hip Abduction with Resistance at Ankles and Counter Support  - 1 x daily - 3 x weekly - 2 sets - 10 reps - 3 sec hold - Standing Hip Extension with Resistance at Ankles and Counter Support  - 1 x daily - 3 x weekly - 2 sets - 10 reps - 3 sec hold - Marching with Resistance  - 1 x daily - 3 x weekly - 2 sets - 10 reps - 3 sec hold - Standing Terminal Knee Extension with Resistance  - 1 x daily - 3 x weekly - 2 sets - 10 reps - 5 sec hold  Patient Education - Check for Safety  PWR! Moves: - Seated -  Standing - Supine  Patient Education - Check for Safety   ASSESSMENT:  CLINICAL IMPRESSION: [name] [name]" states the activities we tested on further balance assessment last visit feel like the types of activities he needs to work on.  Explained how therapeutic activities and neuromuscular reeducation strategies work towards helping him improve on balance deficits identified during testing.  Targeted activities today focusing on narrow base of support, increased stride length with retrogait, and stepping strategies for self correction of loss of balance.  Patient noting less confidence with retro-step on left due to feeling that left knee would buckle, therefore introduced TKE with blue TB to promote quad strengthening for knee stability - HEP instructions provided.  OBJECTIVE IMPAIRMENTS: Abnormal gait, decreased activity tolerance, decreased balance, decreased endurance, decreased mobility, difficulty walking, decreased strength, decreased safety awareness, impaired perceived functional ability, impaired flexibility, improper body mechanics, and postural dysfunction.   ACTIVITY LIMITATIONS: carrying, lifting, standing, squatting, stairs, transfers, bed mobility, reach over head, locomotion level, and caring for others  PARTICIPATION LIMITATIONS: meal prep, cleaning, laundry, driving, shopping, and community activity  PERSONAL FACTORS: Age, Fitness, Past/current experiences, Time since onset of injury/illness/exacerbation, and 3+ comorbidities: PPM 2 1st degree heart block & SA node dysfunction, possible CVA (ACA infarct), orthostatic hypotension, OSA, COPD, hypothyroidism, chronic PTSD, bipolar I disorder,  decreased energy/endurance since COVID-19 in ~March 2022   are also affecting patient's functional outcome.   REHAB POTENTIAL: Good  CLINICAL DECISION MAKING: Evolving/moderate complexity  EVALUATION COMPLEXITY: Moderate   GOALS: Goals reviewed with patient? Yes  SHORT TERM GOALS:  Target date: 11/04/2021   Patient will be independent with initial HEP. Baseline:  Goal status: MET  11/02/21  2.  Patient will demonstrate decreased fall risk by scoring </= 15 sec on cognitive TUG. Baseline: 16.59 sec Goal status: MET  11/05/21 - Cognitive TUG = 13.57 sec  3.  Patient will be educated on strategies to decrease risk of falls.  Baseline:  Goal status: MET  11/05/21  LONG TERM GOALS: Target date: 11/25/2021, extended to 12/23/21  Patient will be independent with ongoing/advanced HEP for self-management at home incorporating PWR! Moves as indicated .  Baseline:  Goal status: IN PROGRESS  2.  Patient will ambulate at least 1000 ft with LRAD including outdoor and unlevel surfaces, independently with no LOB, for improved community gait. Baseline:  Goal status: MET  11/22/21  3.  Patient will be able to step up/down curb safely with LRAD for safety with community ambulation.  Baseline:  Goal status: MET  11/22/21   4.  Patient will improve 5x STS time to </= 16 seconds to demonstrate improved functional strength and transfer efficiency. Baseline: 19.32 sec Goal status: MET  11/25/21  5.  Patient will demonstrate at least 25/30 on FGA to improve gait stability and reduce risk for falls. (MCID = 4 points) Baseline: 17/30 Goal status: MET  11/25/21 - FGA = 25/30  6.  Patient will improve Berg score to >/= 52/56 to improve safety and stability with ADLs in standing and reduce risk for falls. (MCID = 8 points)   Baseline: 42/56 Goal status: IN PROGRESS  11/25/21 - Berg = 50/56  7.  Patient will report >/= 80% on ABC scale to demonstrate improved balance confidence and decreased risk for falls. Baseline: 1060 / 1600 = 66.3% Goal status: IN PROGRESS  11/25/21 - ABC scale improved to 71.9%  8. Patient will verbalize understanding of local Parkinson's disease community resources, including community fitness post d/c. Baseline:  Goal status: MET  11/22/21   PLAN: PT  FREQUENCY: 2x/week  PT DURATION: 4 weeks  PLANNED INTERVENTIONS: Therapeutic exercises, Therapeutic activity, Neuromuscular re-education, Balance training, Gait training, Patient/Family education, Self Care, Joint mobilization, Stair training, DME instructions, Manual therapy, and Re-evaluation  PLAN FOR NEXT SESSION:  progress strengthening and balance activities - dynamic stepping activities, retro step/gait and unstable surfaces; review and progress PWR! Moves; review/update HEP as indicated   Percival Spanish, PT 11/30/2021, 2:23 PM

## 2021-12-02 ENCOUNTER — Encounter: Payer: Self-pay | Admitting: Physical Therapy

## 2021-12-02 ENCOUNTER — Ambulatory Visit: Payer: Medicare Other | Admitting: Physical Therapy

## 2021-12-02 DIAGNOSIS — R2689 Other abnormalities of gait and mobility: Secondary | ICD-10-CM

## 2021-12-02 DIAGNOSIS — M6281 Muscle weakness (generalized): Secondary | ICD-10-CM

## 2021-12-02 DIAGNOSIS — R293 Abnormal posture: Secondary | ICD-10-CM

## 2021-12-02 DIAGNOSIS — R2681 Unsteadiness on feet: Secondary | ICD-10-CM

## 2021-12-02 NOTE — Therapy (Signed)
OUTPATIENT PHYSICAL THERAPY TREATMENT   Patient Name: Victor Osborne MRN: [id number] DOB:Jun 22, 1932, 88 y.o., male Today's Date: 12/02/2021      PT End of Session - 12/02/21 1445     Visit Number 14    Date for PT Re-Evaluation 12/23/21    Authorization Type Medicare & [employer]    Progress Note Due on Visit 20   Recert completed on visit #12 - 11/25/21   PT Start Time 6333    PT Stop Time 1530    PT Time Calculation (min) 45 min    Activity Tolerance Patient tolerated treatment well    Behavior During Therapy WFL for tasks assessed/performed                Past Medical History:  Diagnosis Date   Bipolar 1 disorder ([place])    COPD (chronic obstructive pulmonary disease) ([place])    Depression    First degree AV block    Hypertension    pt denies but was in medical record   Hypoglycemia, unspecified    Myalgia and myositis, unspecified    Obesity    Pneumonia 1970   Skin cancer (melanoma) ([place])    Thyroid disease    Unspecified hypothyroidism    UPJ obstruction, congenital    Past Surgical History:  Procedure Laterality Date   APPENDECTOMY  1939   malignant melanoma removed from right forehead  2008   PERMANENT PACEMAKER INSERTION N/A 06/19/2013   Procedure: PERMANENT PACEMAKER INSERTION;  Surgeon: Evans Lance, MD;  Location: [facility] CATH LAB;  Service: Cardiovascular;  Laterality: N/A;   right big toe  surgery  2009   TRANSURETHRAL RESECTION OF PROSTATE  02/04/2011   Procedure: TRANSURETHRAL RESECTION OF THE PROSTATE (TURP);  Surgeon: Dutch Gray, MD;  Location: [facility] ORS;  Service: Urology;  Laterality: N/A;   URETHRA SURGERY  2010   reconstruction   Patient Active Problem List   Diagnosis Date Noted   History of neoplasm 04/12/2021   Melanocytic nevi of trunk 04/12/2021   Other seborrheic keratosis 04/12/2021   Cerebral embolism with cerebral infarction 03/31/2021   Left leg weakness 03/30/2021   Chronic post-traumatic stress disorder 12/05/2017    Bipolar I disorder, current or most recent episode depressed, in partial remission ([place]) 12/05/2017   CHB (complete heart block) ([place]) 01/27/2017   HTN (hypertension) 01/27/2017   Hypothyroidism 10/11/2016   Parkinson disease 09/08/2016   Pacemaker 09/24/2013   SSS (sick sinus syndrome) ([place]) 06/19/2013   First degree AV block 04/09/2013   Allergic rhinitis 03/17/2013   Obstructive sleep apnea 12/18/2006   COPD mixed type ([place]) 12/18/2006   History of malignant melanoma of skin 12/18/2006   Diabetes mellitus ([place]) 12/18/2006    PCP: [name], FNP   REFERRING PROVIDER: Ludwig Clarks, DO   REFERRING DIAG: G20 (ICD-10-CM) - Parkinson disease ([place])  THERAPY DIAG:  Other abnormalities of gait and mobility  Unsteadiness on feet  Muscle weakness (generalized)  Abnormal posture  RATIONALE FOR EVALUATION AND TREATMENT: Rehabilitation  ONSET DATE: Initial PD diagnosis ~16 yrs ago  NEXT MD VISIT: 04/07/2022   SUBJECTIVE:      SUBJECTIVE STATEMENT: Pt stated that he feels less steady and more tired today; he is using the rollator today.   PAIN:  Are you having pain? No   PERTINENT HISTORY:  PPM 2 1st degree heart block & SA node dysfunction, possible CVA (ACA infarct), orthostatic hypotension, OSA, COPD, hypothyroidism, chronic PTSD, bipolar I disorder,  decreased energy/endurance since COVID-19 in ~March 2022    PRECAUTIONS: Fall and ICD/Pacemaker  WEIGHT BEARING RESTRICTIONS No  FALLS: Has patient fallen in last 6 months? No  LIVING ENVIRONMENT: Lives with: lives alone and with his dog Lives in: [facility] living facility (3rd floor) Stairs:  Elevator Has following equipment at home: Single point cane and hiking pole  OCCUPATION: Retired  PLOF: Independent and Leisure: being outdoors - walking his dog, and stays active most of the day     PATIENT GOALS: "Gain more strength in my legs and gain more balance overall."   OBJECTIVE:   DIAGNOSTIC  FINDINGS:  N/A  COGNITION: Overall cognitive status: Within functional limits for tasks assessed   SENSATION: WFL  COORDINATION: WFL for LE  MUSCLE TONE: WNL  MUSCLE LENGTH: Hamstrings: mod/severe tight B ITB: mod tight B Piriformis: mod tight B Hip IR: mod/severe tight B Hip flexors: mod/severe tight B Quads: mod tight R>L Heelcord: NT  POSTURE:  rounded shoulders, forward head, increased thoracic kyphosis, and flexed trunk   LOWER EXTREMITY ROM:    WFL other than limitations due to muscle tightness as indicated   LOWER EXTREMITY MMT:    MMT Right Eval Left Eval Right 11/22/21 Left 11/22/21  Hip flexion 4+ _0 Hip extension 4 Limited ROM 4+ Limited ROM 4 Limited ROM 4+   Hip abduction 4+ 4+ 4+ 4+  Hip adduction 4+ 4 4+ 4  Hip internal rotation 4+ 4+ 5 5  Hip external rotation 4+ _1 Knee flexion _2 Knee extension _3 Ankle dorsiflexion 4+ 4 5 4+  Ankle plantarflexion 5 5- 5 5  Ankle inversion      Ankle eversion      (Blank rows = not tested)  BED MOBILITY:  Sit to supine SBA Supine to sit SBA Rolling to Right SBA Rolling to Left SBA  TRANSFERS: Assistive device utilized: None  Sit to stand: Complete Independence and Modified independence - UE assist necessary from lower surface height Stand to sit: Modified independence - uncontrolled descent w/o UE assist Chair to chair: Modified independence Floor:  NT  GAIT: Gait pattern: step through pattern, decreased arm swing- Left, decreased step length- Right, decreased trunk rotation, trunk flexed, and wide BOS Distance walked: 80 Assistive device utilized: Environmental consultant - 4 wheeled and None - 10MWT/balance assessments completed w/o AD Level of assistance: Modified independence Comments: Gait speed = 3.90 ft/sec (decreased from 4.34 ft/sec as of prior discharge from PT)  RAMP: Level of Assistance:  NT Assistive device utilized:  NT Ramp Comments:   CURB:  Level of Assistance:   NT Assistive device utilized:  NT Curb Comments:   STAIRS: (10/20/21)  Level of Assistance: SBA  Stair Negotiation Technique: Alternating Pattern  with Single Rail on Right  Number of Stairs: 14   Height of Stairs: 7  Comments: foot placement on steps with significant overhang of heel putting him at risk for posterior LOB    FUNCTIONAL TESTS:  5 times sit to stand: 19.32 sec, >16 sec indicates a fall risk for patients with PD Timed up and go (TUG): Normal = 11.06 sec; Manual = 14.04 sec; Cognitive = 16.59 sec (difficulty with cognitive task - Cognitive TUG > 15 sec = high fall risk in community dwelling older adults) 10 meter walk test: 8.41 sec (no AD); Gait speed = 3.90 ft/sec Berg Balance Scale: 42/56; 37-45 = significant risk for falls (>80%)  Functional gait assessment: 17/30; < 19 = high risk fall (10/20/21)  Scores as of discharge from last PT episode - December 2022: 5 times sit to stand: 13.75 sec Timed up and go (TUG): Normal = 9.38 sec; Manual = NT; Cognitive = 11.88 sec 10 meter walk test: 7.56 sec (no AD); Gait speed = 4.34 ft/sec Berg: 53/56; 52-55 lower (> 25%) fall risk Functional gait assessment: 27/30; 25-28 = low risk fall   PATIENT SURVEYS:  ABC scale 1060 / 1600 = 66.3%   TODAY'S TREATMENT: 12/02/21 THERAPEUTIC EXERCISE: to improve flexibility, strength and mobility.  Verbal and tactile cues throughout for technique. NuStep - L6 x 6 min   NEUROMUSCULAR RE-EDUCATION: To improve balance, coordination, and reduce fall risk.  Tandem gait 4 x 10' - using hand on counter for support, cues to look forward  Backwards Tandem Gait 4 x 10' - using hand on counter for support, PT SBA  PWR! Step backwards R x 5, L x 5 - standing close to counter but no UE support necessary PWR! High Step and Turn (standing in corner) 2 x 5 Corner balance progression with narrow BOS and 1/4 tandem stance on complaint surface and Airex pad with arms at sides            Eyes open: -  static stance x 30 sec - horiz head turns x 5 - vertical head nods x 5 Eyes closed: - static stance x 10 sec   11/30/21 THERAPEUTIC EXERCISE: to improve flexibility, strength and mobility.  Verbal and tactile cues throughout for technique.  NuStep - L6 x 6 min L knee blue TB TKE 2 x 10 - cues to slow pace and hold 5" on TKE  NEUROMUSCULAR RE-EDUCATION: To improve posture, balance, proprioception, coordination, reduce fall risk, amplitude of movement, and reduce rigidity. Alt fwd step + contralateral reach to cone atop 36" FR x 10 Tandem stance 2 x 30" - 1 rep with R & L foot forward Tandem gait 10 ft along counter 2 x 10 ft with light touch on counter, 2 x 10 ft with hand hovering over counter Reverse tandem gait x 10 ft with light touch on counter Retro gait 2 x 10 ft with light touch on counter PWR! Step backwards R x 10, L x 5 - standing close to counter but no UE support necessary, SBA of PT   11/25/21 THERAPEUTIC EXERCISE: to improve flexibility, strength and mobility.  Verbal and tactile cues throughout for technique.  NuStep - L6 x 6 min  THERAPEUTIC ACTIVITIES: ABC Scale: 1150 / 1600 = 71.9% 5xSTS: 13.56 sec TUG: Normal = 9.96 sec; Manual = 9.18 sec; Cognitive = 10.84 sec (difficulty with cognitive task) 10MWT: 7.72 sec (no AD); Gait speed = 4.25 ft/sec Berg Balance Scale: 50/56; 46-51 moderate risk for falls (>50%)  FGA: 25/30; 25-28 = low risk fall    PATIENT EDUCATION: Education details: progress with PT and ongoing PT POC Person educated: Patient Education method: Explanation Education comprehension: verbalized understanding   HOME EXERCISE PROGRAM: Access Code: [email or web address] URL: [email or web address] Date: 11/30/2021 Prepared by: Annie Paras  Exercises - Seated Hamstring Stretch with Strap  - 2 x daily - 7 x weekly - 3 reps - 30 sec hold - Seated Figure 4 Piriformis Stretch  - 2 x daily - 7 x weekly - 3 reps - 30 sec hold - Standing Hip Flexor  Stretch  - 2 x daily - 7 x weekly - 2 sets - 10 reps -  30 sec hold - Seated March with Resistance  - 1 x daily - 7 x weekly - 2 sets - 10 reps - 3 sec hold - Seated Isometric Hip Abduction with Resistance  - 1 x daily - 7 x weekly - 2 sets - 10 reps - 3 sec hold - Sit to Stand Without Arm Support  - 1 x daily - 7 x weekly - 2 sets - 10 reps - Seated Ankle Dorsiflexion with Resistance  - 1 x daily - 7 x weekly - 2 sets - 10 reps - 3 sec hold - Standing Hip Abduction with Resistance at Ankles and Counter Support  - 1 x daily - 3 x weekly - 2 sets - 10 reps - 3 sec hold - Standing Hip Extension with Resistance at Ankles and Counter Support  - 1 x daily - 3 x weekly - 2 sets - 10 reps - 3 sec hold - Marching with Resistance  - 1 x daily - 3 x weekly - 2 sets - 10 reps - 3 sec hold - Standing Terminal Knee Extension with Resistance  - 1 x daily - 3 x weekly - 2 sets - 10 reps - 5 sec hold  Patient Education - Check for Safety  PWR! Moves: - Seated - Standing - Supine  Patient Education - Check for Safety   ASSESSMENT:  CLINICAL IMPRESSION: [place]" reports feeling of a good workout last visit which he feels is helping with his balance although he did use his rollator to come to PT today due complaints of increased fatigue with the appointment being later in the day. Therapeutic focus continuing to address balance and movement patterns to address Parkinson's related deficits, but increased fatigue necessitating more frequent rest breaks. LOB most common to right during corner balance activities with intermittent ability to self-correct. [place] will continue to benefit from skilled PT for further strengthening and balance to promote better stability and decreased fall risk.  OBJECTIVE IMPAIRMENTS: Abnormal gait, decreased activity tolerance, decreased balance, decreased endurance, decreased mobility, difficulty walking, decreased strength, decreased safety awareness, impaired perceived  functional ability, impaired flexibility, improper body mechanics, and postural dysfunction.   ACTIVITY LIMITATIONS: carrying, lifting, standing, squatting, stairs, transfers, bed mobility, reach over head, locomotion level, and caring for others  PARTICIPATION LIMITATIONS: meal prep, cleaning, laundry, driving, shopping, and community activity  PERSONAL FACTORS: Age, Fitness, Past/current experiences, Time since onset of injury/illness/exacerbation, and 3+ comorbidities: PPM 2 1st degree heart block & SA node dysfunction, possible CVA (ACA infarct), orthostatic hypotension, OSA, COPD, hypothyroidism, chronic PTSD, bipolar I disorder,  decreased energy/endurance since COVID-19 in ~March 2022   are also affecting patient's functional outcome.   REHAB POTENTIAL: Good  CLINICAL DECISION MAKING: Evolving/moderate complexity  EVALUATION COMPLEXITY: Moderate   GOALS: Goals reviewed with patient? Yes  SHORT TERM GOALS: Target date: 11/04/2021   Patient will be independent with initial HEP. Baseline:  Goal status: MET  11/02/21  2.  Patient will demonstrate decreased fall risk by scoring </= 15 sec on cognitive TUG. Baseline: 16.59 sec Goal status: MET  11/05/21 - Cognitive TUG = 13.57 sec  3.  Patient will be educated on strategies to decrease risk of falls.  Baseline:  Goal status: MET  11/05/21  LONG TERM GOALS: Target date: 11/25/2021, extended to 12/23/21  Patient will be independent with ongoing/advanced HEP for self-management at home incorporating PWR! Moves as indicated .  Baseline:  Goal status: IN PROGRESS  2.  Patient will ambulate at  least 1000 ft with LRAD including outdoor and unlevel surfaces, independently with no LOB, for improved community gait. Baseline:  Goal status: MET  11/22/21  3.  Patient will be able to step up/down curb safely with LRAD for safety with community ambulation.  Baseline:  Goal status: MET  11/22/21   4.  Patient will improve 5x STS time to  </= 16 seconds to demonstrate improved functional strength and transfer efficiency. Baseline: 19.32 sec Goal status: MET  11/25/21  5.  Patient will demonstrate at least 25/30 on FGA to improve gait stability and reduce risk for falls. (MCID = 4 points) Baseline: 17/30 Goal status: MET  11/25/21 - FGA = 25/30  6.  Patient will improve Berg score to >/= 52/56 to improve safety and stability with ADLs in standing and reduce risk for falls. (MCID = 8 points)   Baseline: 42/56 Goal status: IN PROGRESS  11/25/21 - Berg = 50/56  7.  Patient will report >/= 80% on ABC scale to demonstrate improved balance confidence and decreased risk for falls. Baseline: 1060 / 1600 = 66.3% Goal status: IN PROGRESS  11/25/21 - ABC scale improved to 71.9%  8. Patient will verbalize understanding of local Parkinson's disease community resources, including community fitness post d/c. Baseline:  Goal status: MET  11/22/21   PLAN: PT FREQUENCY: 2x/week  PT DURATION: 4 weeks  PLANNED INTERVENTIONS: Therapeutic exercises, Therapeutic activity, Neuromuscular re-education, Balance training, Gait training, Patient/Family education, Self Care, Joint mobilization, Stair training, DME instructions, Manual therapy, and Re-evaluation  PLAN FOR NEXT SESSION:  progress strengthening and balance activities - dynamic stepping activities, retro step/gait and unstable surfaces; review and progress PWR! Moves; review/update HEP as indicated   Percival Spanish, PT 12/02/2021, 6:10 PM

## 2021-12-07 ENCOUNTER — Ambulatory Visit: Payer: Medicare Other | Admitting: Physical Therapy

## 2021-12-07 ENCOUNTER — Encounter: Payer: Self-pay | Admitting: Physical Therapy

## 2021-12-07 DIAGNOSIS — R2689 Other abnormalities of gait and mobility: Secondary | ICD-10-CM

## 2021-12-07 DIAGNOSIS — R2681 Unsteadiness on feet: Secondary | ICD-10-CM

## 2021-12-07 DIAGNOSIS — R293 Abnormal posture: Secondary | ICD-10-CM

## 2021-12-07 DIAGNOSIS — M6281 Muscle weakness (generalized): Secondary | ICD-10-CM

## 2021-12-07 NOTE — Therapy (Signed)
OUTPATIENT PHYSICAL THERAPY TREATMENT   Patient Name: Victor Osborne MRN: [id number] DOB:1933/01/04, 88 y.o., male Today's Date: 12/07/2021      PT End of Session - 12/07/21 1356     Visit Number 15    Date for PT Re-Evaluation 12/23/21    Authorization Type Medicare & [employer]    Progress Note Due on Visit 20   Recert completed on visit #12 - 11/25/21   PT Start Time 1356    PT Stop Time 1446    PT Time Calculation (min) 50 min    Activity Tolerance Patient tolerated treatment well    Behavior During Therapy WFL for tasks assessed/performed                Past Medical History:  Diagnosis Date   Bipolar 1 disorder ([place])    COPD (chronic obstructive pulmonary disease) ([place])    Depression    First degree AV block    Hypertension    pt denies but was in medical record   Hypoglycemia, unspecified    Myalgia and myositis, unspecified    Obesity    Pneumonia 1970   Skin cancer (melanoma) ([place])    Thyroid disease    Unspecified hypothyroidism    UPJ obstruction, congenital    Past Surgical History:  Procedure Laterality Date   APPENDECTOMY  1939   malignant melanoma removed from right forehead  2008   PERMANENT PACEMAKER INSERTION N/A 06/19/2013   Procedure: PERMANENT PACEMAKER INSERTION;  Surgeon: Evans Lance, MD;  Location: [facility] CATH LAB;  Service: Cardiovascular;  Laterality: N/A;   right big toe  surgery  2009   TRANSURETHRAL RESECTION OF PROSTATE  02/04/2011   Procedure: TRANSURETHRAL RESECTION OF THE PROSTATE (TURP);  Surgeon: Dutch Gray, MD;  Location: [facility] ORS;  Service: Urology;  Laterality: N/A;   URETHRA SURGERY  2010   reconstruction   Patient Active Problem List   Diagnosis Date Noted   History of neoplasm 04/12/2021   Melanocytic nevi of trunk 04/12/2021   Other seborrheic keratosis 04/12/2021   Cerebral embolism with cerebral infarction 03/31/2021   Left leg weakness 03/30/2021   Chronic post-traumatic stress disorder 12/05/2017    Bipolar I disorder, current or most recent episode depressed, in partial remission ([place]) 12/05/2017   CHB (complete heart block) ([place]) 01/27/2017   HTN (hypertension) 01/27/2017   Hypothyroidism 10/11/2016   Parkinson disease 09/08/2016   Pacemaker 09/24/2013   SSS (sick sinus syndrome) ([place]) 06/19/2013   First degree AV block 04/09/2013   Allergic rhinitis 03/17/2013   Obstructive sleep apnea 12/18/2006   COPD mixed type ([place]) 12/18/2006   History of malignant melanoma of skin 12/18/2006   Diabetes mellitus ([place]) 12/18/2006    PCP: [name], FNP   REFERRING PROVIDER: Ludwig Clarks, DO   REFERRING DIAG: G20 (ICD-10-CM) - Parkinson disease ([place])  THERAPY DIAG:  Other abnormalities of gait and mobility  Unsteadiness on feet  Muscle weakness (generalized)  Abnormal posture  RATIONALE FOR EVALUATION AND TREATMENT: Rehabilitation  ONSET DATE: Initial PD diagnosis ~16 yrs ago  NEXT MD VISIT: 04/07/2022   SUBJECTIVE:      SUBJECTIVE STATEMENT: Pt states he feels more steady today, came in using single point cane with no pain.   PAIN:  Are you having pain? No   PERTINENT HISTORY:  PPM 2 1st degree heart block & SA node dysfunction, possible CVA (ACA infarct), orthostatic hypotension, OSA, COPD, hypothyroidism, chronic PTSD, bipolar I disorder,  decreased  energy/endurance since COVID-19 in ~March 2022    PRECAUTIONS: Fall and ICD/Pacemaker  WEIGHT BEARING RESTRICTIONS No  FALLS: Has patient fallen in last 6 months? No  LIVING ENVIRONMENT: Lives with: lives alone and with his dog Lives in: [facility] facility (3rd floor) Stairs:  Elevator Has following equipment at home: Single point cane and hiking pole  OCCUPATION: Retired  PLOF: Independent and Leisure: being outdoors - walking his dog, and stays active most of the day     PATIENT GOALS: "Gain more strength in my legs and gain more balance overall."   OBJECTIVE:   DIAGNOSTIC  FINDINGS:  N/A  COGNITION: Overall cognitive status: Within functional limits for tasks assessed   SENSATION: WFL  COORDINATION: WFL for LE  MUSCLE TONE: WNL  MUSCLE LENGTH: Hamstrings: mod/severe tight B ITB: mod tight B Piriformis: mod tight B Hip IR: mod/severe tight B Hip flexors: mod/severe tight B Quads: mod tight R>L Heelcord: NT  POSTURE:  rounded shoulders, forward head, increased thoracic kyphosis, and flexed trunk   LOWER EXTREMITY ROM:    WFL other than limitations due to muscle tightness as indicated   LOWER EXTREMITY MMT:    MMT Right Eval Left Eval Right 11/22/21 Left 11/22/21  Hip flexion 4+ _0 Hip extension 4 Limited ROM 4+ Limited ROM 4 Limited ROM 4+   Hip abduction 4+ 4+ 4+ 4+  Hip adduction 4+ 4 4+ 4  Hip internal rotation 4+ 4+ 5 5  Hip external rotation 4+ _1 Knee flexion _2 Knee extension _3 Ankle dorsiflexion 4+ 4 5 4+  Ankle plantarflexion 5 5- 5 5  Ankle inversion      Ankle eversion      (Blank rows = not tested)  BED MOBILITY:  Sit to supine SBA Supine to sit SBA Rolling to Right SBA Rolling to Left SBA  TRANSFERS: Assistive device utilized: None  Sit to stand: Complete Independence and Modified independence - UE assist necessary from lower surface height Stand to sit: Modified independence - uncontrolled descent w/o UE assist Chair to chair: Modified independence Floor:  NT  GAIT: Gait pattern: step through pattern, decreased arm swing- Left, decreased step length- Right, decreased trunk rotation, trunk flexed, and wide BOS Distance walked: 80 Assistive device utilized: Environmental consultant - 4 wheeled and None - 10MWT/balance assessments completed w/o AD Level of assistance: Modified independence Comments: Gait speed = 3.90 ft/sec (decreased from 4.34 ft/sec as of prior discharge from PT)  RAMP: Level of Assistance:  NT Assistive device utilized:  NT Ramp Comments:   CURB:  Level of Assistance:   NT Assistive device utilized:  NT Curb Comments:   STAIRS: (10/20/21)  Level of Assistance: SBA  Stair Negotiation Technique: Alternating Pattern  with Single Rail on Right  Number of Stairs: 14   Height of Stairs: 7  Comments: foot placement on steps with significant overhang of heel putting him at risk for posterior LOB    FUNCTIONAL TESTS:  5 times sit to stand: 19.32 sec, >16 sec indicates a fall risk for patients with PD Timed up and go (TUG): Normal = 11.06 sec; Manual = 14.04 sec; Cognitive = 16.59 sec (difficulty with cognitive task - Cognitive TUG > 15 sec = high fall risk in community dwelling older adults) 10 meter walk test: 8.41 sec (no AD); Gait speed = 3.90 ft/sec Berg Balance Scale: 42/56; 37-45 = significant risk for falls (>80%)  Functional  gait assessment: 17/30; < 19 = high risk fall (10/20/21)  Scores as of discharge from last PT episode - December 2022: 5 times sit to stand: 13.75 sec Timed up and go (TUG): Normal = 9.38 sec; Manual = NT; Cognitive = 11.88 sec 10 meter walk test: 7.56 sec (no AD); Gait speed = 4.34 ft/sec Berg: 53/56; 52-55 lower (> 25%) fall risk Functional gait assessment: 27/30; 25-28 = low risk fall   PATIENT SURVEYS:  ABC scale 1060 / 1600 = 66.3%   TODAY'S TREATMENT: 12/07/21 THERAPEUTIC EXERCISE: to improve flexibility, strength and mobility.  Verbal and tactile cues throughout for technique. NuStep- L6 x 6 min  NEUROMUSCULAR RE-EDUCATION: To improve balance, coordination, and reduce fall risk. Standing with feet together on airex with Shoulder Horizontal ABD 2x10 Standing with feet together on airex reaching in different directions 2x15  Tandem Walking with counter support forward 4x10 ft- cues to look forward Backward Walking 4x10 ft- cues to increase foot clearance when stepping backward Forward Walking over cones 4x10 ft- cues to step over cones and continue tandem walking Forward walking around cones 4x10 ft- cues to increase  step length and decrease shuffling  4 Square Step CW + CCW 1x10 each- cues to increase step length to the side Standing AirEx with Horizontal Head Turns R/L 1x10, in corner Standing AirEx with Vertical Head Turns R/L 1x10, in corner Standing AirEx with EC 3x8 sec- LOB 1x to the right   12/02/21 THERAPEUTIC EXERCISE: to improve flexibility, strength and mobility.  Verbal and tactile cues throughout for technique. NuStep - L6 x 6 min   NEUROMUSCULAR RE-EDUCATION: To improve balance, coordination, and reduce fall risk.  Tandem gait 4 x 10' - using hand on counter for support, cues to look forward  Backwards Tandem Gait 4 x 10' - using hand on counter for support, PT SBA  PWR! Step backwards R x 5, L x 5 - standing close to counter but no UE support necessary PWR! High Step and Turn (standing in corner) 2 x 5 Corner balance progression with narrow BOS and 1/4 tandem stance on complaint surface and Airex pad with arms at sides            Eyes open: - static stance x 30 sec - horiz head turns x 5 - vertical head nods x 5 Eyes closed: - static stance x 10 sec   11/30/21 THERAPEUTIC EXERCISE: to improve flexibility, strength and mobility.  Verbal and tactile cues throughout for technique.  NuStep - L6 x 6 min L knee blue TB TKE 2 x 10 - cues to slow pace and hold 5" on TKE  NEUROMUSCULAR RE-EDUCATION: To improve posture, balance, proprioception, coordination, reduce fall risk, amplitude of movement, and reduce rigidity. Alt fwd step + contralateral reach to cone atop 36" FR x 10 Tandem stance 2 x 30" - 1 rep with R & L foot forward Tandem gait 10 ft along counter 2 x 10 ft with light touch on counter, 2 x 10 ft with hand hovering over counter Reverse tandem gait x 10 ft with light touch on counter Retro gait 2 x 10 ft with light touch on counter PWR! Step backwards R x 10, L x 5 - standing close to counter but no UE support necessary, SBA of PT      PATIENT EDUCATION: Education  details: HEP review Person educated: Patient Education method: Explanation Education comprehension: verbalized understanding   HOME EXERCISE PROGRAM: Access Code: [email or web address] URL: [email or web address] Date: 11/30/2021  Prepared by: Annie Paras  Exercises - Seated Hamstring Stretch with Strap  - 2 x daily - 7 x weekly - 3 reps - 30 sec hold - Seated Figure 4 Piriformis Stretch  - 2 x daily - 7 x weekly - 3 reps - 30 sec hold - Standing Hip Flexor Stretch  - 2 x daily - 7 x weekly - 2 sets - 10 reps - 30 sec hold - Seated March with Resistance  - 1 x daily - 7 x weekly - 2 sets - 10 reps - 3 sec hold - Seated Isometric Hip Abduction with Resistance  - 1 x daily - 7 x weekly - 2 sets - 10 reps - 3 sec hold - Sit to Stand Without Arm Support  - 1 x daily - 7 x weekly - 2 sets - 10 reps - Seated Ankle Dorsiflexion with Resistance  - 1 x daily - 7 x weekly - 2 sets - 10 reps - 3 sec hold - Standing Hip Abduction with Resistance at Ankles and Counter Support  - 1 x daily - 3 x weekly - 2 sets - 10 reps - 3 sec hold - Standing Hip Extension with Resistance at Ankles and Counter Support  - 1 x daily - 3 x weekly - 2 sets - 10 reps - 3 sec hold - Marching with Resistance  - 1 x daily - 3 x weekly - 2 sets - 10 reps - 3 sec hold - Standing Terminal Knee Extension with Resistance  - 1 x daily - 3 x weekly - 2 sets - 10 reps - 5 sec hold  Patient Education - Check for Safety  PWR! Moves: - Seated - Standing - Supine  Patient Education - Check for Safety   ASSESSMENT:  CLINICAL IMPRESSION: "Bill" reports feeling good about exercises he is doing at therapy session. This session included NMR for static and dynamic balance on firm and foam surfaces. He responded well to balance interventions to address fall risk and stability but still had occasional LOB when performing exercises stepping backwards and on foam surface. Continued skilled therapy to address balance limitations to  promote increased stability.   OBJECTIVE IMPAIRMENTS: Abnormal gait, decreased activity tolerance, decreased balance, decreased endurance, decreased mobility, difficulty walking, decreased strength, decreased safety awareness, impaired perceived functional ability, impaired flexibility, improper body mechanics, and postural dysfunction.   ACTIVITY LIMITATIONS: carrying, lifting, standing, squatting, stairs, transfers, bed mobility, reach over head, locomotion level, and caring for others  PARTICIPATION LIMITATIONS: meal prep, cleaning, laundry, driving, shopping, and community activity  PERSONAL FACTORS: Age, Fitness, Past/current experiences, Time since onset of injury/illness/exacerbation, and 3+ comorbidities: PPM 2 1st degree heart block & SA node dysfunction, possible CVA (ACA infarct), orthostatic hypotension, OSA, COPD, hypothyroidism, chronic PTSD, bipolar I disorder,  decreased energy/endurance since COVID-19 in ~March 2022   are also affecting patient's functional outcome.   REHAB POTENTIAL: Good  CLINICAL DECISION MAKING: Evolving/moderate complexity  EVALUATION COMPLEXITY: Moderate   GOALS: Goals reviewed with patient? Yes  SHORT TERM GOALS: Target date: 11/04/2021   Patient will be independent with initial HEP. Baseline:  Goal status: MET  11/02/21  2.  Patient will demonstrate decreased fall risk by scoring </= 15 sec on cognitive TUG. Baseline: 16.59 sec Goal status: MET  11/05/21 - Cognitive TUG = 13.57 sec  3.  Patient will be educated on strategies to decrease risk of falls.  Baseline:  Goal status: MET  11/05/21  LONG TERM GOALS: Target date:  11/25/2021, extended to 12/23/21  Patient will be independent with ongoing/advanced HEP for self-management at home incorporating PWR! Moves as indicated .  Baseline:  Goal status: IN PROGRESS  2.  Patient will ambulate at least 1000 ft with LRAD including outdoor and unlevel surfaces, independently with no LOB, for  improved community gait. Baseline:  Goal status: MET  11/22/21  3.  Patient will be able to step up/down curb safely with LRAD for safety with community ambulation.  Baseline:  Goal status: MET  11/22/21   4.  Patient will improve 5x STS time to </= 16 seconds to demonstrate improved functional strength and transfer efficiency. Baseline: 19.32 sec Goal status: MET  11/25/21  5.  Patient will demonstrate at least 25/30 on FGA to improve gait stability and reduce risk for falls. (MCID = 4 points) Baseline: 17/30 Goal status: MET  11/25/21 - FGA = 25/30  6.  Patient will improve Berg score to >/= 52/56 to improve safety and stability with ADLs in standing and reduce risk for falls. (MCID = 8 points)   Baseline: 42/56 Goal status: IN PROGRESS  11/25/21 - Berg = 50/56  7.  Patient will report >/= 80% on ABC scale to demonstrate improved balance confidence and decreased risk for falls. Baseline: 1060 / 1600 = 66.3% Goal status: IN PROGRESS  11/25/21 - ABC scale improved to 71.9%  8. Patient will verbalize understanding of local Parkinson's disease community resources, including community fitness post d/c. Baseline:  Goal status: MET  11/22/21   PLAN: PT FREQUENCY: 2x/week  PT DURATION: 4 weeks  PLANNED INTERVENTIONS: Therapeutic exercises, Therapeutic activity, Neuromuscular re-education, Balance training, Gait training, Patient/Family education, Self Care, Joint mobilization, Stair training, DME instructions, Manual therapy, and Re-evaluation  PLAN FOR NEXT SESSION:  progress strengthening and balance activities - dynamic stepping activities, retro step/gait and unstable surfaces; review and progress PWR! Moves; review/update HEP as indicated   Zeb Comfort, Student-PT 12/07/2021, 2:46 PM

## 2021-12-14 ENCOUNTER — Encounter: Payer: Self-pay | Admitting: Physical Therapy

## 2021-12-14 ENCOUNTER — Ambulatory Visit: Payer: Medicare Other | Admitting: Physical Therapy

## 2021-12-14 ENCOUNTER — Ambulatory Visit (INDEPENDENT_AMBULATORY_CARE_PROVIDER_SITE_OTHER): Payer: Medicare Other

## 2021-12-14 DIAGNOSIS — R2681 Unsteadiness on feet: Secondary | ICD-10-CM

## 2021-12-14 DIAGNOSIS — R293 Abnormal posture: Secondary | ICD-10-CM

## 2021-12-14 DIAGNOSIS — R2689 Other abnormalities of gait and mobility: Secondary | ICD-10-CM

## 2021-12-14 DIAGNOSIS — M6281 Muscle weakness (generalized): Secondary | ICD-10-CM

## 2021-12-14 DIAGNOSIS — I495 Sick sinus syndrome: Secondary | ICD-10-CM | POA: Diagnosis not present

## 2021-12-14 LAB — CUP PACEART REMOTE DEVICE CHECK
Battery Remaining Longevity: 42 mo
Battery Remaining Percentage: 47 %
Brady Statistic RA Percent Paced: 85 %
Brady Statistic RV Percent Paced: 64 %
Date Time Interrogation Session: 20231128002000
Implantable Lead Connection Status: 753985
Implantable Lead Connection Status: 753985
Implantable Lead Implant Date: 20150603
Implantable Lead Implant Date: 20150603
Implantable Lead Location: 753859
Implantable Lead Location: 753860
Implantable Lead Model: 4136
Implantable Lead Model: 4137
Implantable Lead Serial Number: 29477746
Implantable Lead Serial Number: 29617326
Implantable Pulse Generator Implant Date: 20150603
Lead Channel Impedance Value: 550 Ohm
Lead Channel Impedance Value: 743 Ohm
Lead Channel Pacing Threshold Amplitude: 0.6 V
Lead Channel Pacing Threshold Amplitude: 2.2 V
Lead Channel Pacing Threshold Pulse Width: 0.4 ms
Lead Channel Pacing Threshold Pulse Width: 0.5 ms
Lead Channel Setting Pacing Amplitude: 1.9 V
Lead Channel Setting Pacing Amplitude: 2 V
Lead Channel Setting Pacing Pulse Width: 0.4 ms
Lead Channel Setting Sensing Sensitivity: 2.5 mV
Pulse Gen Serial Number: 389736
Zone Setting Status: 755011

## 2021-12-14 NOTE — Therapy (Signed)
OUTPATIENT PHYSICAL THERAPY TREATMENT   Patient Name: Victor Osborne MRN: [id number] DOB:Jan 28, 1932, 88 y.o., male Today's Date: 12/14/2021      PT End of Session - 12/14/21 1511     Visit Number 16    Date for PT Re-Evaluation 12/23/21    Authorization Type Medicare & [employer]    Progress Note Due on Visit 57   Recert completed on visit #12 - 11/25/21   PT Start Time 9753    PT Stop Time 1558    PT Time Calculation (min) 47 min    Activity Tolerance Patient tolerated treatment well    Behavior During Therapy WFL for tasks assessed/performed                Past Medical History:  Diagnosis Date   Bipolar 1 disorder ([place])    COPD (chronic obstructive pulmonary disease) ([place])    Depression    First degree AV block    Hypertension    pt denies but was in medical record   Hypoglycemia, unspecified    Myalgia and myositis, unspecified    Obesity    Pneumonia 1970   Skin cancer (melanoma) ([place])    Thyroid disease    Unspecified hypothyroidism    UPJ obstruction, congenital    Past Surgical History:  Procedure Laterality Date   APPENDECTOMY  1939   malignant melanoma removed from right forehead  2008   PERMANENT PACEMAKER INSERTION N/A 06/19/2013   Procedure: PERMANENT PACEMAKER INSERTION;  Surgeon: Evans Lance, MD;  Location: [facility] CATH LAB;  Service: Cardiovascular;  Laterality: N/A;   right big toe  surgery  2009   TRANSURETHRAL RESECTION OF PROSTATE  02/04/2011   Procedure: TRANSURETHRAL RESECTION OF THE PROSTATE (TURP);  Surgeon: Dutch Gray, MD;  Location: [facility] ORS;  Service: Urology;  Laterality: N/A;   URETHRA SURGERY  2010   reconstruction   Patient Active Problem List   Diagnosis Date Noted   History of neoplasm 04/12/2021   Melanocytic nevi of trunk 04/12/2021   Other seborrheic keratosis 04/12/2021   Cerebral embolism with cerebral infarction 03/31/2021   Left leg weakness 03/30/2021   Chronic post-traumatic stress disorder 12/05/2017    Bipolar I disorder, current or most recent episode depressed, in partial remission ([place]) 12/05/2017   CHB (complete heart block) ([place]) 01/27/2017   HTN (hypertension) 01/27/2017   Hypothyroidism 10/11/2016   Parkinson disease 09/08/2016   Pacemaker 09/24/2013   SSS (sick sinus syndrome) ([place]) 06/19/2013   First degree AV block 04/09/2013   Allergic rhinitis 03/17/2013   Obstructive sleep apnea 12/18/2006   COPD mixed type ([place]) 12/18/2006   History of malignant melanoma of skin 12/18/2006   Diabetes mellitus ([place]) 12/18/2006    PCP: [name], FNP   REFERRING PROVIDER: Ludwig Clarks, DO   REFERRING DIAG: G20 (ICD-10-CM) - Parkinson disease ([place])  THERAPY DIAG:  Other abnormalities of gait and mobility  Unsteadiness on feet  Muscle weakness (generalized)  Abnormal posture  RATIONALE FOR EVALUATION AND TREATMENT: Rehabilitation  ONSET DATE: Initial PD diagnosis ~16 yrs ago  NEXT MD VISIT: 04/07/2022   SUBJECTIVE:      SUBJECTIVE STATEMENT: Pt reports no pain or complaints for today.   PAIN:  Are you having pain? No   PERTINENT HISTORY:  PPM 2 1st degree heart block & SA node dysfunction, possible CVA (ACA infarct), orthostatic hypotension, OSA, COPD, hypothyroidism, chronic PTSD, bipolar I disorder,  decreased energy/endurance since COVID-19 in ~March 2022  PRECAUTIONS: Fall and ICD/Pacemaker  WEIGHT BEARING RESTRICTIONS No  FALLS: Has patient fallen in last 6 months? No  LIVING ENVIRONMENT: Lives with: lives alone and with his dog Lives in: [facility] living facility (3rd floor) Stairs:  Elevator Has following equipment at home: Single point cane and hiking pole  OCCUPATION: Retired  PLOF: Independent and Leisure: being outdoors - walking his dog, and stays active most of the day     PATIENT GOALS: "Gain more strength in my legs and gain more balance overall."   OBJECTIVE:   DIAGNOSTIC FINDINGS:  N/A  COGNITION: Overall  cognitive status: Within functional limits for tasks assessed   SENSATION: WFL  COORDINATION: WFL for LE  MUSCLE TONE: WNL  MUSCLE LENGTH: Hamstrings: mod/severe tight B ITB: mod tight B Piriformis: mod tight B Hip IR: mod/severe tight B Hip flexors: mod/severe tight B Quads: mod tight R>L Heelcord: NT  POSTURE:  rounded shoulders, forward head, increased thoracic kyphosis, and flexed trunk   LOWER EXTREMITY ROM:    WFL other than limitations due to muscle tightness as indicated   LOWER EXTREMITY MMT:    MMT Right Eval Left Eval Right 11/22/21 Left 11/22/21  Hip flexion 4+ _0 Hip extension 4 Limited ROM 4+ Limited ROM 4 Limited ROM 4+   Hip abduction 4+ 4+ 4+ 4+  Hip adduction 4+ 4 4+ 4  Hip internal rotation 4+ 4+ 5 5  Hip external rotation 4+ _1 Knee flexion _2 Knee extension _3 Ankle dorsiflexion 4+ 4 5 4+  Ankle plantarflexion 5 5- 5 5  Ankle inversion      Ankle eversion      (Blank rows = not tested)  BED MOBILITY:  Sit to supine SBA Supine to sit SBA Rolling to Right SBA Rolling to Left SBA  TRANSFERS: Assistive device utilized: None  Sit to stand: Complete Independence and Modified independence - UE assist necessary from lower surface height Stand to sit: Modified independence - uncontrolled descent w/o UE assist Chair to chair: Modified independence Floor:  NT  GAIT: Gait pattern: step through pattern, decreased arm swing- Left, decreased step length- Right, decreased trunk rotation, trunk flexed, and wide BOS Distance walked: 80 Assistive device utilized: Environmental consultant - 4 wheeled and None - 10MWT/balance assessments completed w/o AD Level of assistance: Modified independence Comments: Gait speed = 3.90 ft/sec (decreased from 4.34 ft/sec as of prior discharge from PT)  RAMP: Level of Assistance:  NT Assistive device utilized:  NT Ramp Comments:   CURB:  Level of Assistance:  NT Assistive device utilized:  NT Curb  Comments:   STAIRS: (10/20/21)  Level of Assistance: SBA  Stair Negotiation Technique: Alternating Pattern  with Single Rail on Right  Number of Stairs: 14   Height of Stairs: 7  Comments: foot placement on steps with significant overhang of heel putting him at risk for posterior LOB    FUNCTIONAL TESTS:  5 times sit to stand: 19.32 sec, >16 sec indicates a fall risk for patients with PD Timed up and go (TUG): Normal = 11.06 sec; Manual = 14.04 sec; Cognitive = 16.59 sec (difficulty with cognitive task - Cognitive TUG > 15 sec = high fall risk in community dwelling older adults) 10 meter walk test: 8.41 sec (no AD); Gait speed = 3.90 ft/sec Berg Balance Scale: 42/56; 37-45 = significant risk for falls (>80%)  Functional gait assessment: 17/30; < 19 = high risk fall (  10/20/21)  Scores as of discharge from last PT episode - December 2022: 5 times sit to stand: 13.75 sec Timed up and go (TUG): Normal = 9.38 sec; Manual = NT; Cognitive = 11.88 sec 10 meter walk test: 7.56 sec (no AD); Gait speed = 4.34 ft/sec Berg: 53/56; 52-55 lower (> 25%) fall risk Functional gait assessment: 27/30; 25-28 = low risk fall   PATIENT SURVEYS:  ABC scale 1060 / 1600 = 66.3%   TODAY'S TREATMENT: 12/14/21 THERAPEUTIC EXERCISE: to improve flexibility, strength and mobility.  Verbal and tactile cues throughout for technique. NuStep L5x6 min Forward Step Up 4" R/L 1x10- attempted 9" step but deferred due to aggravated L knee pain Forward Step Up and Over 4"R/L 1x10- cues for sequencing of steps  NEUROMUSCULAR RE-EDUCATION: To improve posture, balance, reduce fall risk, amplitude of movement, and reduce rigidity. Standing Clock Reaches 1/2 circle R, 1/2 circle L x3 each side  4 square step CW + CCW 2x5- cues to increase step length Forward Step with Opposite Arm Reach to Touch cone R/L 2x10- cueing for sequencing of motions and for opposite arm and leg motion Static Standing on airex with shoulder  horizontal abduction 2x10 Standing Marches on Airex- 2x10  Staggered Stance with Shoulder Diagonals using RTB in door frame R/L1x10 - LOB 2x but was able to recover with side steps   12/07/21 THERAPEUTIC EXERCISE: to improve flexibility, strength and mobility.  Verbal and tactile cues throughout for technique. NuStep- L6 x 6 min  NEUROMUSCULAR RE-EDUCATION: To improve balance, coordination, and reduce fall risk. Standing with feet together on airex with Shoulder Horizontal ABD 2x10 Standing with feet together on airex reaching in different directions 2x15  Tandem Walking with counter support forward 4x10 ft- cues to look forward Backward Walking 4x10 ft- cues to increase foot clearance when stepping backward Forward Walking over cones 4x10 ft- cues to step over cones and continue tandem walking Forward walking around cones 4x10 ft- cues to increase step length and decrease shuffling  4 Square Step CW + CCW 1x10 each- cues to increase step length to the side Standing AirEx with Horizontal Head Turns R/L 1x10, in corner Standing AirEx with Vertical Head Turns R/L 1x10, in corner Standing AirEx with EC 3x8 sec- LOB 1x to the right   12/02/21 THERAPEUTIC EXERCISE: to improve flexibility, strength and mobility.  Verbal and tactile cues throughout for technique. NuStep - L6 x 6 min   NEUROMUSCULAR RE-EDUCATION: To improve balance, coordination, and reduce fall risk.  Tandem gait 4 x 10' - using hand on counter for support, cues to look forward  Backwards Tandem Gait 4 x 10' - using hand on counter for support, PT SBA  PWR! Step backwards R x 5, L x 5 - standing close to counter but no UE support necessary PWR! High Step and Turn (standing in corner) 2 x 5 Corner balance progression with narrow BOS and 1/4 tandem stance on complaint surface and Airex pad with arms at sides            Eyes open: - static stance x 30 sec - horiz head turns x 5 - vertical head nods x 5 Eyes closed: -  static stance x 10 sec   PATIENT EDUCATION: Education details: HEP review Person educated: Patient Education method: Explanation Education comprehension: verbalized understanding   HOME EXERCISE PROGRAM: Access Code: [email or web address] URL: [email or web address] Date: 11/30/2021 Prepared by: Annie Paras  Exercises - Seated Hamstring Stretch with Strap  - 2 x daily -  7 x weekly - 3 reps - 30 sec hold - Seated Figure 4 Piriformis Stretch  - 2 x daily - 7 x weekly - 3 reps - 30 sec hold - Standing Hip Flexor Stretch  - 2 x daily - 7 x weekly - 2 sets - 10 reps - 30 sec hold - Seated March with Resistance  - 1 x daily - 7 x weekly - 2 sets - 10 reps - 3 sec hold - Seated Isometric Hip Abduction with Resistance  - 1 x daily - 7 x weekly - 2 sets - 10 reps - 3 sec hold - Sit to Stand Without Arm Support  - 1 x daily - 7 x weekly - 2 sets - 10 reps - Seated Ankle Dorsiflexion with Resistance  - 1 x daily - 7 x weekly - 2 sets - 10 reps - 3 sec hold - Standing Hip Abduction with Resistance at Ankles and Counter Support  - 1 x daily - 3 x weekly - 2 sets - 10 reps - 3 sec hold - Standing Hip Extension with Resistance at Ankles and Counter Support  - 1 x daily - 3 x weekly - 2 sets - 10 reps - 3 sec hold - Marching with Resistance  - 1 x daily - 3 x weekly - 2 sets - 10 reps - 3 sec hold - Standing Terminal Knee Extension with Resistance  - 1 x daily - 3 x weekly - 2 sets - 10 reps - 5 sec hold  Patient Education - Check for Safety  PWR! Moves: - Seated - Standing - Supine  Patient Education - Check for Safety   ASSESSMENT:  CLINICAL IMPRESSION: "Bill" reports therapy has been beneficial thus far. This session included NMR to improve static and dynamic balance on firm and foam surfaces and TE to increase LE strengthening during functional activities such as stairs. He responded well to balance interventions but still needs cueing for some sequencing of interventions involving  opposite leg and arm motions. Continued skilled therapy to address balance limitations to promote increased stability.   OBJECTIVE IMPAIRMENTS: Abnormal gait, decreased activity tolerance, decreased balance, decreased endurance, decreased mobility, difficulty walking, decreased strength, decreased safety awareness, impaired perceived functional ability, impaired flexibility, improper body mechanics, and postural dysfunction.   ACTIVITY LIMITATIONS: carrying, lifting, standing, squatting, stairs, transfers, bed mobility, reach over head, locomotion level, and caring for others  PARTICIPATION LIMITATIONS: meal prep, cleaning, laundry, driving, shopping, and community activity  PERSONAL FACTORS: Age, Fitness, Past/current experiences, Time since onset of injury/illness/exacerbation, and 3+ comorbidities: PPM 2 1st degree heart block & SA node dysfunction, possible CVA (ACA infarct), orthostatic hypotension, OSA, COPD, hypothyroidism, chronic PTSD, bipolar I disorder,  decreased energy/endurance since COVID-19 in ~March 2022   are also affecting patient's functional outcome.   REHAB POTENTIAL: Good  CLINICAL DECISION MAKING: Evolving/moderate complexity  EVALUATION COMPLEXITY: Moderate   GOALS: Goals reviewed with patient? Yes  SHORT TERM GOALS: Target date: 11/04/2021   Patient will be independent with initial HEP. Baseline:  Goal status: MET  11/02/21  2.  Patient will demonstrate decreased fall risk by scoring </= 15 sec on cognitive TUG. Baseline: 16.59 sec Goal status: MET  11/05/21 - Cognitive TUG = 13.57 sec  3.  Patient will be educated on strategies to decrease risk of falls.  Baseline:  Goal status: MET  11/05/21  LONG TERM GOALS: Target date: 11/25/2021, extended to 12/23/21  Patient will be independent with ongoing/advanced HEP for self-management  at home incorporating PWR! Moves as indicated .  Baseline:  Goal status: IN PROGRESS  2.  Patient will ambulate at least  1000 ft with LRAD including outdoor and unlevel surfaces, independently with no LOB, for improved community gait. Baseline:  Goal status: MET  11/22/21  3.  Patient will be able to step up/down curb safely with LRAD for safety with community ambulation.  Baseline:  Goal status: MET  11/22/21   4.  Patient will improve 5x STS time to </= 16 seconds to demonstrate improved functional strength and transfer efficiency. Baseline: 19.32 sec Goal status: MET  11/25/21  5.  Patient will demonstrate at least 25/30 on FGA to improve gait stability and reduce risk for falls. (MCID = 4 points) Baseline: 17/30 Goal status: MET  11/25/21 - FGA = 25/30  6.  Patient will improve Berg score to >/= 52/56 to improve safety and stability with ADLs in standing and reduce risk for falls. (MCID = 8 points)   Baseline: 42/56 Goal status: IN PROGRESS  11/25/21 - Berg = 50/56  7.  Patient will report >/= 80% on ABC scale to demonstrate improved balance confidence and decreased risk for falls. Baseline: 1060 / 1600 = 66.3% Goal status: IN PROGRESS  11/25/21 - ABC scale improved to 71.9%  8. Patient will verbalize understanding of local Parkinson's disease community resources, including community fitness post d/c. Baseline:  Goal status: MET  11/22/21   PLAN: PT FREQUENCY: 2x/week  PT DURATION: 4 weeks  PLANNED INTERVENTIONS: Therapeutic exercises, Therapeutic activity, Neuromuscular re-education, Balance training, Gait training, Patient/Family education, Self Care, Joint mobilization, Stair training, DME instructions, Manual therapy, and Re-evaluation  PLAN FOR NEXT SESSION: progress strengthening and balance activities - dynamic stepping activities, retro step/gait and unstable surfaces; review and progress PWR! Moves; review/update HEP as indicated   Zeb Comfort, Student-PT 12/14/2021, 4:21 PM

## 2021-12-16 ENCOUNTER — Encounter: Payer: Self-pay | Admitting: Physical Therapy

## 2021-12-16 ENCOUNTER — Ambulatory Visit: Payer: Medicare Other | Admitting: Physical Therapy

## 2021-12-16 DIAGNOSIS — R2681 Unsteadiness on feet: Secondary | ICD-10-CM

## 2021-12-16 DIAGNOSIS — M6281 Muscle weakness (generalized): Secondary | ICD-10-CM

## 2021-12-16 DIAGNOSIS — R2689 Other abnormalities of gait and mobility: Secondary | ICD-10-CM | POA: Diagnosis not present

## 2021-12-16 DIAGNOSIS — R293 Abnormal posture: Secondary | ICD-10-CM

## 2021-12-16 NOTE — Therapy (Signed)
OUTPATIENT PHYSICAL THERAPY TREATMENT   Patient Name: Victor Osborne MRN: [id number] DOB:Jun 20, 1932, 88 y.o., male Today's Date: 12/16/2021      PT End of Session - 12/16/21 1349     Visit Number 17    Date for PT Re-Evaluation 12/23/21    Authorization Type Medicare & [employer]    Progress Note Due on Visit 20   Recert completed on visit #12 - 11/25/21   PT Start Time 4174    PT Stop Time 1436    PT Time Calculation (min) 47 min    Activity Tolerance Patient tolerated treatment well    Behavior During Therapy WFL for tasks assessed/performed                Past Medical History:  Diagnosis Date   Bipolar 1 disorder ([place])    COPD (chronic obstructive pulmonary disease) ([place])    Depression    First degree AV block    Hypertension    pt denies but was in medical record   Hypoglycemia, unspecified    Myalgia and myositis, unspecified    Obesity    Pneumonia 1970   Skin cancer (melanoma) ([place])    Thyroid disease    Unspecified hypothyroidism    UPJ obstruction, congenital    Past Surgical History:  Procedure Laterality Date   APPENDECTOMY  1939   malignant melanoma removed from right forehead  2008   PERMANENT PACEMAKER INSERTION N/A 06/19/2013   Procedure: PERMANENT PACEMAKER INSERTION;  Surgeon: Evans Lance, MD;  Location: [facility] CATH LAB;  Service: Cardiovascular;  Laterality: N/A;   right big toe  surgery  2009   TRANSURETHRAL RESECTION OF PROSTATE  02/04/2011   Procedure: TRANSURETHRAL RESECTION OF THE PROSTATE (TURP);  Surgeon: Dutch Gray, MD;  Location: [facility] ORS;  Service: Urology;  Laterality: N/A;   URETHRA SURGERY  2010   reconstruction   Patient Active Problem List   Diagnosis Date Noted   History of neoplasm 04/12/2021   Melanocytic nevi of trunk 04/12/2021   Other seborrheic keratosis 04/12/2021   Cerebral embolism with cerebral infarction 03/31/2021   Left leg weakness 03/30/2021   Chronic post-traumatic stress disorder 12/05/2017    Bipolar I disorder, current or most recent episode depressed, in partial remission ([place]) 12/05/2017   CHB (complete heart block) ([place]) 01/27/2017   HTN (hypertension) 01/27/2017   Hypothyroidism 10/11/2016   Parkinson disease 09/08/2016   Pacemaker 09/24/2013   SSS (sick sinus syndrome) ([place]) 06/19/2013   First degree AV block 04/09/2013   Allergic rhinitis 03/17/2013   Obstructive sleep apnea 12/18/2006   COPD mixed type ([place]) 12/18/2006   History of malignant melanoma of skin 12/18/2006   Diabetes mellitus ([place]) 12/18/2006    PCP: [name], FNP   REFERRING PROVIDER: Ludwig Clarks, DO   REFERRING DIAG: G20 (ICD-10-CM) - Parkinson disease ([place])  THERAPY DIAG:  Other abnormalities of gait and mobility  Unsteadiness on feet  Muscle weakness (generalized)  Abnormal posture  RATIONALE FOR EVALUATION AND TREATMENT: Rehabilitation  ONSET DATE: Initial PD diagnosis ~16 yrs ago  NEXT MD VISIT: 04/07/2022   SUBJECTIVE:      SUBJECTIVE STATEMENT: Pt states he is "good with no pain". He does say that his balance is off today and he woke up "yucky".   PAIN:  Are you having pain? No   PERTINENT HISTORY:  PPM 2 1st degree heart block & SA node dysfunction, possible CVA (ACA infarct), orthostatic hypotension, OSA, COPD, hypothyroidism, chronic  PTSD, bipolar I disorder,  decreased energy/endurance since COVID-19 in ~March 2022    PRECAUTIONS: Fall and ICD/Pacemaker  WEIGHT BEARING RESTRICTIONS No  FALLS: Has patient fallen in last 6 months? No  LIVING ENVIRONMENT: Lives with: lives alone and with his dog Lives in: [facility] living facility (3rd floor) Stairs:  Elevator Has following equipment at home: Single point cane and hiking pole  OCCUPATION: Retired  PLOF: Independent and Leisure: being outdoors - walking his dog, and stays active most of the day     PATIENT GOALS: "Gain more strength in my legs and gain more balance overall."   OBJECTIVE:    DIAGNOSTIC FINDINGS:  N/A  COGNITION: Overall cognitive status: Within functional limits for tasks assessed   SENSATION: WFL  COORDINATION: WFL for LE  MUSCLE TONE: WNL  MUSCLE LENGTH: Hamstrings: mod/severe tight B ITB: mod tight B Piriformis: mod tight B Hip IR: mod/severe tight B Hip flexors: mod/severe tight B Quads: mod tight R>L Heelcord: NT  POSTURE:  rounded shoulders, forward head, increased thoracic kyphosis, and flexed trunk   LOWER EXTREMITY ROM:    WFL other than limitations due to muscle tightness as indicated   LOWER EXTREMITY MMT:    MMT Right Eval Left Eval Right 11/22/21 Left 11/22/21  Hip flexion 4+ _0 Hip extension 4 Limited ROM 4+ Limited ROM 4 Limited ROM 4+   Hip abduction 4+ 4+ 4+ 4+  Hip adduction 4+ 4 4+ 4  Hip internal rotation 4+ 4+ 5 5  Hip external rotation 4+ _1 Knee flexion _2 Knee extension _3 Ankle dorsiflexion 4+ 4 5 4+  Ankle plantarflexion 5 5- 5 5  Ankle inversion      Ankle eversion      (Blank rows = not tested)  BED MOBILITY:  Sit to supine SBA Supine to sit SBA Rolling to Right SBA Rolling to Left SBA  TRANSFERS: Assistive device utilized: None  Sit to stand: Complete Independence and Modified independence - UE assist necessary from lower surface height Stand to sit: Modified independence - uncontrolled descent w/o UE assist Chair to chair: Modified independence Floor:  NT  GAIT: Gait pattern: step through pattern, decreased arm swing- Left, decreased step length- Right, decreased trunk rotation, trunk flexed, and wide BOS Distance walked: 80 Assistive device utilized: Environmental consultant - 4 wheeled and None - 10MWT/balance assessments completed w/o AD Level of assistance: Modified independence Comments: Gait speed = 3.90 ft/sec (decreased from 4.34 ft/sec as of prior discharge from PT)  RAMP: Level of Assistance:  NT Assistive device utilized:  NT Ramp Comments:   CURB:  Level of  Assistance:  NT Assistive device utilized:  NT Curb Comments:   STAIRS: (10/20/21)  Level of Assistance: SBA  Stair Negotiation Technique: Alternating Pattern  with Single Rail on Right  Number of Stairs: 14   Height of Stairs: 7  Comments: foot placement on steps with significant overhang of heel putting him at risk for posterior LOB    FUNCTIONAL TESTS:  5 times sit to stand: 19.32 sec, >16 sec indicates a fall risk for patients with PD Timed up and go (TUG): Normal = 11.06 sec; Manual = 14.04 sec; Cognitive = 16.59 sec (difficulty with cognitive task - Cognitive TUG > 15 sec = high fall risk in community dwelling older adults) 10 meter walk test: 8.41 sec (no AD); Gait speed = 3.90 ft/sec Berg Balance Scale: 42/56; 37-45 = significant  risk for falls (>80%)  Functional gait assessment: 17/30; < 19 = high risk fall (10/20/21)  Scores as of discharge from last PT episode - December 2022: 5 times sit to stand: 13.75 sec Timed up and go (TUG): Normal = 9.38 sec; Manual = NT; Cognitive = 11.88 sec 10 meter walk test: 7.56 sec (no AD); Gait speed = 4.34 ft/sec Berg: 53/56; 52-55 lower (> 25%) fall risk Functional gait assessment: 27/30; 25-28 = low risk fall   PATIENT SURVEYS:  ABC scale 1060 / 1600 = 66.3%   TODAY'S TREATMENT: 12/16/21 THERAPEUTIC EXERCISE: to improve flexibility, strength and mobility.  Verbal and tactile cues throughout for technique. NuStep L6x75mn  NEUROMUSCULAR RE-EDUCATION: To improve balance, coordination, reduce fall risk, amplitude of movement, speed of movement to reduce bradykinesia, and reduce rigidity. Standing PWR! Moves- Up, Rock, Twist, Step 1x10 each- cues to leave feet stationary during PWR rock and not step out PWR! Sit to Stand 1x5- cues to lift chest up Standing Toe Tap 9" step alt R/L on Stool with chair for balance 2x10  Standing with toe taps to 9" stool on airex pad using BUE light support for balance R/L 2x10 Standing Airex in  corner: -feet shoulder width with 2x15 sec rhythmic stabilization  -feet together with 2x15 sec rhythmic stabilization Forward Step with Opposite Arm Reach to Touch cone R/L 2x10- LOB 1x and minimal cueing for sequencing needed   12/14/21 THERAPEUTIC EXERCISE: to improve flexibility, strength and mobility.  Verbal and tactile cues throughout for technique. NuStep L5x6 min Forward Step Up 4" R/L 1x10- attempted 9" step but deferred due to aggravated L knee pain Forward Step Up and Over 4"R/L 1x10- cues for sequencing of steps  NEUROMUSCULAR RE-EDUCATION: To improve posture, balance, reduce fall risk, amplitude of movement, and reduce rigidity. Standing Clock Reaches 1/2 circle R, 1/2 circle L x3 each side  4 square step CW + CCW 2x5- cues to increase step length Forward Step with Opposite Arm Reach to Touch cone R/L 2x10- cueing for sequencing of motions and for opposite arm and leg motion Static Standing on airex with shoulder horizontal abduction 2x10 Standing Marches on Airex- 2x10  Staggered Stance with Shoulder Diagonals using RTB in door frame R/L1x10 - LOB 2x but was able to recover with side steps   12/07/21 THERAPEUTIC EXERCISE: to improve flexibility, strength and mobility.  Verbal and tactile cues throughout for technique. NuStep- L6 x 6 min  NEUROMUSCULAR RE-EDUCATION: To improve balance, coordination, and reduce fall risk. Standing with feet together on airex with Shoulder Horizontal ABD 2x10 Standing with feet together on airex reaching in different directions 2x15  Tandem Walking with counter support forward 4x10 ft- cues to look forward Backward Walking 4x10 ft- cues to increase foot clearance when stepping backward Forward Walking over cones 4x10 ft- cues to step over cones and continue tandem walking Forward walking around cones 4x10 ft- cues to increase step length and decrease shuffling  4 Square Step CW + CCW 1x10 each- cues to increase step length to the  side Standing AirEx with Horizontal Head Turns R/L 1x10, in corner Standing AirEx with Vertical Head Turns R/L 1x10, in corner Standing AirEx with EC 3x8 sec- LOB 1x to the right   PATIENT EDUCATION: Education details: HEP review Person educated: Patient Education method: Explanation Education comprehension: verbalized understanding   HOME EXERCISE PROGRAM: Access Code: [email or web address]: [email or web address] Date: 11/30/2021 Prepared by: JAnnie Paras Exercises - Seated Hamstring Stretch with Strap  - 2 x  daily - 7 x weekly - 3 reps - 30 sec hold - Seated Figure 4 Piriformis Stretch  - 2 x daily - 7 x weekly - 3 reps - 30 sec hold - Standing Hip Flexor Stretch  - 2 x daily - 7 x weekly - 2 sets - 10 reps - 30 sec hold - Seated March with Resistance  - 1 x daily - 7 x weekly - 2 sets - 10 reps - 3 sec hold - Seated Isometric Hip Abduction with Resistance  - 1 x daily - 7 x weekly - 2 sets - 10 reps - 3 sec hold - Sit to Stand Without Arm Support  - 1 x daily - 7 x weekly - 2 sets - 10 reps - Seated Ankle Dorsiflexion with Resistance  - 1 x daily - 7 x weekly - 2 sets - 10 reps - 3 sec hold - Standing Hip Abduction with Resistance at Ankles and Counter Support  - 1 x daily - 3 x weekly - 2 sets - 10 reps - 3 sec hold - Standing Hip Extension with Resistance at Ankles and Counter Support  - 1 x daily - 3 x weekly - 2 sets - 10 reps - 3 sec hold - Marching with Resistance  - 1 x daily - 3 x weekly - 2 sets - 10 reps - 3 sec hold - Standing Terminal Knee Extension with Resistance  - 1 x daily - 3 x weekly - 2 sets - 10 reps - 5 sec hold  Patient Education - Check for Safety  PWR! Moves: - Seated - Standing - Supine  Patient Education - Check for Safety   ASSESSMENT:  CLINICAL IMPRESSION: "Bill" reports feeling tired and off of balance today. He was able to progress dynamic and static balance exercises and tolerate them well. He needed minimal cueing for sequencing  of tasks but still needed reminders on how to perform standing PWR! Moves. This session focused on NMR to improve static and dynamic balance exercises and increase amplitude of movements. Rush Landmark will continue to benefit from skilled therapy with progression of balance and exercises to slow progression of PD.   OBJECTIVE IMPAIRMENTS: Abnormal gait, decreased activity tolerance, decreased balance, decreased endurance, decreased mobility, difficulty walking, decreased strength, decreased safety awareness, impaired perceived functional ability, impaired flexibility, improper body mechanics, and postural dysfunction.   ACTIVITY LIMITATIONS: carrying, lifting, standing, squatting, stairs, transfers, bed mobility, reach over head, locomotion level, and caring for others  PARTICIPATION LIMITATIONS: meal prep, cleaning, laundry, driving, shopping, and community activity  PERSONAL FACTORS: Age, Fitness, Past/current experiences, Time since onset of injury/illness/exacerbation, and 3+ comorbidities: PPM 2 1st degree heart block & SA node dysfunction, possible CVA (ACA infarct), orthostatic hypotension, OSA, COPD, hypothyroidism, chronic PTSD, bipolar I disorder,  decreased energy/endurance since COVID-19 in ~March 2022   are also affecting patient's functional outcome.   REHAB POTENTIAL: Good  CLINICAL DECISION MAKING: Evolving/moderate complexity  EVALUATION COMPLEXITY: Moderate   GOALS: Goals reviewed with patient? Yes  SHORT TERM GOALS: Target date: 11/04/2021   Patient will be independent with initial HEP. Baseline:  Goal status: MET  11/02/21  2.  Patient will demonstrate decreased fall risk by scoring </= 15 sec on cognitive TUG. Baseline: 16.59 sec Goal status: MET  11/05/21 - Cognitive TUG = 13.57 sec  3.  Patient will be educated on strategies to decrease risk of falls.  Baseline:  Goal status: MET  11/05/21  LONG TERM GOALS: Target date: 11/25/2021,  extended to 12/23/21  Patient will  be independent with ongoing/advanced HEP for self-management at home incorporating PWR! Moves as indicated .  Baseline:  Goal status: IN PROGRESS  2.  Patient will ambulate at least 1000 ft with LRAD including outdoor and unlevel surfaces, independently with no LOB, for improved community gait. Baseline:  Goal status: MET  11/22/21  3.  Patient will be able to step up/down curb safely with LRAD for safety with community ambulation.  Baseline:  Goal status: MET  11/22/21   4.  Patient will improve 5x STS time to </= 16 seconds to demonstrate improved functional strength and transfer efficiency. Baseline: 19.32 sec Goal status: MET  11/25/21  5.  Patient will demonstrate at least 25/30 on FGA to improve gait stability and reduce risk for falls. (MCID = 4 points) Baseline: 17/30 Goal status: MET  11/25/21 - FGA = 25/30  6.  Patient will improve Berg score to >/= 52/56 to improve safety and stability with ADLs in standing and reduce risk for falls. (MCID = 8 points)   Baseline: 42/56 Goal status: IN PROGRESS  11/25/21 - Berg = 50/56  7.  Patient will report >/= 80% on ABC scale to demonstrate improved balance confidence and decreased risk for falls. Baseline: 1060 / 1600 = 66.3% Goal status: IN PROGRESS  11/25/21 - ABC scale improved to 71.9%  8. Patient will verbalize understanding of local Parkinson's disease community resources, including community fitness post d/c. Baseline:  Goal status: MET  11/22/21   PLAN: PT FREQUENCY: 2x/week  PT DURATION: 4 weeks  PLANNED INTERVENTIONS: Therapeutic exercises, Therapeutic activity, Neuromuscular re-education, Balance training, Gait training, Patient/Family education, Self Care, Joint mobilization, Stair training, DME instructions, Manual therapy, and Re-evaluation  PLAN FOR NEXT SESSION: review/consolidate HEP in preparation for d/c to HEP; progress strengthening and balance activities - dynamic stepping activities, retro step/gait and unstable  surfaces; review and progress PWR! Moves; review/update HEP as indicated   Zeb Comfort, Student-PT 12/16/2021, 2:38 PM

## 2021-12-21 ENCOUNTER — Ambulatory Visit: Payer: Medicare Other | Attending: Neurology | Admitting: Physical Therapy

## 2021-12-21 ENCOUNTER — Encounter: Payer: Self-pay | Admitting: Physical Therapy

## 2021-12-21 DIAGNOSIS — M6281 Muscle weakness (generalized): Secondary | ICD-10-CM | POA: Diagnosis present

## 2021-12-21 DIAGNOSIS — R293 Abnormal posture: Secondary | ICD-10-CM | POA: Insufficient documentation

## 2021-12-21 DIAGNOSIS — R2681 Unsteadiness on feet: Secondary | ICD-10-CM | POA: Diagnosis present

## 2021-12-21 DIAGNOSIS — R2689 Other abnormalities of gait and mobility: Secondary | ICD-10-CM | POA: Diagnosis present

## 2021-12-21 NOTE — Therapy (Signed)
OUTPATIENT PHYSICAL THERAPY TREATMENT   Patient Name: Victor Osborne MRN: [id number] DOB:May 24, 1932, 88 y.o., male Today's Date: 12/21/2021      PT End of Session - 12/21/21 1317     Visit Number 18    Date for PT Re-Evaluation 12/23/21    Authorization Type Medicare & [employer]    Progress Note Due on Visit 20   Recert completed on visit #12 - 11/25/21   PT Start Time 3664    PT Stop Time 1359    PT Time Calculation (min) 42 min    Activity Tolerance Patient tolerated treatment well    Behavior During Therapy WFL for tasks assessed/performed                 Past Medical History:  Diagnosis Date   Bipolar 1 disorder ([place])    COPD (chronic obstructive pulmonary disease) ([place])    Depression    First degree AV block    Hypertension    pt denies but was in medical record   Hypoglycemia, unspecified    Myalgia and myositis, unspecified    Obesity    Pneumonia 1970   Skin cancer (melanoma) ([place])    Thyroid disease    Unspecified hypothyroidism    UPJ obstruction, congenital    Past Surgical History:  Procedure Laterality Date   APPENDECTOMY  1939   malignant melanoma removed from right forehead  2008   PERMANENT PACEMAKER INSERTION N/A 06/19/2013   Procedure: PERMANENT PACEMAKER INSERTION;  Surgeon: Evans Lance, MD;  Location: [facility] CATH LAB;  Service: Cardiovascular;  Laterality: N/A;   right big toe  surgery  2009   TRANSURETHRAL RESECTION OF PROSTATE  02/04/2011   Procedure: TRANSURETHRAL RESECTION OF THE PROSTATE (TURP);  Surgeon: Dutch Gray, MD;  Location: [facility] ORS;  Service: Urology;  Laterality: N/A;   URETHRA SURGERY  2010   reconstruction   Patient Active Problem List   Diagnosis Date Noted   History of neoplasm 04/12/2021   Melanocytic nevi of trunk 04/12/2021   Other seborrheic keratosis 04/12/2021   Cerebral embolism with cerebral infarction 03/31/2021   Left leg weakness 03/30/2021   Chronic post-traumatic stress disorder 12/05/2017    Bipolar I disorder, current or most recent episode depressed, in partial remission ([place]) 12/05/2017   CHB (complete heart block) ([place]) 01/27/2017   HTN (hypertension) 01/27/2017   Hypothyroidism 10/11/2016   Parkinson disease 09/08/2016   Pacemaker 09/24/2013   SSS (sick sinus syndrome) ([place]) 06/19/2013   First degree AV block 04/09/2013   Allergic rhinitis 03/17/2013   Obstructive sleep apnea 12/18/2006   COPD mixed type ([place]) 12/18/2006   History of malignant melanoma of skin 12/18/2006   Diabetes mellitus ([employer]) 12/18/2006    PCP: [name], FNP   REFERRING PROVIDER: Ludwig Clarks, DO   REFERRING DIAG: G20 (ICD-10-CM) - Parkinson disease ([place])  THERAPY DIAG:  Other abnormalities of gait and mobility  Unsteadiness on feet  Muscle weakness (generalized)  Abnormal posture  RATIONALE FOR EVALUATION AND TREATMENT: Rehabilitation  ONSET DATE: Initial PD diagnosis ~16 yrs ago  NEXT MD VISIT: 04/07/2022   SUBJECTIVE:      SUBJECTIVE STATEMENT: No pain or complaints this visit.   PAIN:  Are you having pain? No   PERTINENT HISTORY:  PPM 2 1st degree heart block & SA node dysfunction, possible CVA (ACA infarct), orthostatic hypotension, OSA, COPD, hypothyroidism, chronic PTSD, bipolar I disorder,  decreased energy/endurance since COVID-19 in ~March 2022  PRECAUTIONS: Fall and ICD/Pacemaker  WEIGHT BEARING RESTRICTIONS No  FALLS: Has patient fallen in last 6 months? No  LIVING ENVIRONMENT: Lives with: lives alone and with his dog Lives in: [facility] living facility (3rd floor) Stairs:  Elevator Has following equipment at home: Single point cane and hiking pole  OCCUPATION: Retired  PLOF: Independent and Leisure: being outdoors - walking his dog, and stays active most of the day     PATIENT GOALS: "Gain more strength in my legs and gain more balance overall."   OBJECTIVE:   DIAGNOSTIC FINDINGS:  N/A  COGNITION: Overall cognitive status:  Within functional limits for tasks assessed   SENSATION: WFL  COORDINATION: WFL for LE  MUSCLE TONE: WNL  MUSCLE LENGTH: Hamstrings: mod/severe tight B ITB: mod tight B Piriformis: mod tight B Hip IR: mod/severe tight B Hip flexors: mod/severe tight B Quads: mod tight R>L Heelcord: NT  POSTURE:  rounded shoulders, forward head, increased thoracic kyphosis, and flexed trunk   LOWER EXTREMITY ROM:    WFL other than limitations due to muscle tightness as indicated   LOWER EXTREMITY MMT:    MMT Right Eval Left Eval Right 11/22/21 Left 11/22/21  Hip flexion 4+ _0 Hip extension 4 Limited ROM 4+ Limited ROM 4 Limited ROM 4+   Hip abduction 4+ 4+ 4+ 4+  Hip adduction 4+ 4 4+ 4  Hip internal rotation 4+ 4+ 5 5  Hip external rotation 4+ _1 Knee flexion _2 Knee extension _3 Ankle dorsiflexion 4+ 4 5 4+  Ankle plantarflexion 5 5- 5 5  Ankle inversion      Ankle eversion      (Blank rows = not tested)  BED MOBILITY:  Sit to supine SBA Supine to sit SBA Rolling to Right SBA Rolling to Left SBA  TRANSFERS: Assistive device utilized: None  Sit to stand: Complete Independence and Modified independence - UE assist necessary from lower surface height Stand to sit: Modified independence - uncontrolled descent w/o UE assist Chair to chair: Modified independence Floor:  NT  GAIT: Gait pattern: step through pattern, decreased arm swing- Left, decreased step length- Right, decreased trunk rotation, trunk flexed, and wide BOS Distance walked: 80 Assistive device utilized: Environmental consultant - 4 wheeled and None - 10MWT/balance assessments completed w/o AD Level of assistance: Modified independence Comments: Gait speed = 3.90 ft/sec (decreased from 4.34 ft/sec as of prior discharge from PT)  RAMP: Level of Assistance:  NT Assistive device utilized:  NT Ramp Comments:   CURB:  Level of Assistance:  NT Assistive device utilized:  NT Curb Comments:   STAIRS:  (10/20/21)  Level of Assistance: SBA  Stair Negotiation Technique: Alternating Pattern  with Single Rail on Right  Number of Stairs: 14   Height of Stairs: 7  Comments: foot placement on steps with significant overhang of heel putting him at risk for posterior LOB    FUNCTIONAL TESTS:  5 times sit to stand: 19.32 sec, >16 sec indicates a fall risk for patients with PD Timed up and go (TUG): Normal = 11.06 sec; Manual = 14.04 sec; Cognitive = 16.59 sec (difficulty with cognitive task - Cognitive TUG > 15 sec = high fall risk in community dwelling older adults) 10 meter walk test: 8.41 sec (no AD); Gait speed = 3.90 ft/sec Berg Balance Scale: 42/56; 37-45 = significant risk for falls (>80%)  Functional gait assessment: 17/30; < 19 = high risk fall (  10/20/21)  Scores as of discharge from last PT episode - December 2022: 5 times sit to stand: 13.75 sec Timed up and go (TUG): Normal = 9.38 sec; Manual = NT; Cognitive = 11.88 sec 10 meter walk test: 7.56 sec (no AD); Gait speed = 4.34 ft/sec Berg: 53/56; 52-55 lower (> 25%) fall risk Functional gait assessment: 27/30; 25-28 = low risk fall   PATIENT SURVEYS:  ABC scale 1060 / 1600 = 66.3%   TODAY'S TREATMENT: 12/18/21 THERAPEUTIC EXERCISE: to improve flexibility, strength and mobility.  Verbal and tactile cues throughout for technique. NuStep L5 x 22mn  THERAPEUTIC ACTIVITIES: Berg Balance Scale: 53/56 FGA: 27/30 HEP review/consolidation   12/16/21 THERAPEUTIC EXERCISE: to improve flexibility, strength and mobility.  Verbal and tactile cues throughout for technique. NuStep L6x642m  NEUROMUSCULAR RE-EDUCATION: To improve balance, coordination, reduce fall risk, amplitude of movement, speed of movement to reduce bradykinesia, and reduce rigidity. Standing PWR! Moves- Up, Rock, Twist, Step 1x10 each- cues to leave feet stationary during PWR rock and not step out PWR! Sit to Stand 1x5- cues to lift chest up Standing Toe Tap 9" step alt  R/L on Stool with chair for balance 2x10  Standing with toe taps to 9" stool on airex pad using BUE light support for balance R/L 2x10 Standing Airex in corner: -feet shoulder width with 2x15 sec rhythmic stabilization  -feet together with 2x15 sec rhythmic stabilization Forward Step with Opposite Arm Reach to Touch cone R/L 2x10- LOB 1x and minimal cueing for sequencing needed   12/14/21 THERAPEUTIC EXERCISE: to improve flexibility, strength and mobility.  Verbal and tactile cues throughout for technique. NuStep L5x6 min Forward Step Up 4" R/L 1x10- attempted 9" step but deferred due to aggravated L knee pain Forward Step Up and Over 4"R/L 1x10- cues for sequencing of steps  NEUROMUSCULAR RE-EDUCATION: To improve posture, balance, reduce fall risk, amplitude of movement, and reduce rigidity. Standing Clock Reaches 1/2 circle R, 1/2 circle L x3 each side  4 square step CW + CCW 2x5- cues to increase step length Forward Step with Opposite Arm Reach to Touch cone R/L 2x10- cueing for sequencing of motions and for opposite arm and leg motion Static Standing on airex with shoulder horizontal abduction 2x10 Standing Marches on Airex- 2x10  Staggered Stance with Shoulder Diagonals using RTB in door frame R/L1x10 - LOB 2x but was able to recover with side steps   PATIENT EDUCATION: Education details: HEP review Person educated: Patient Education method: Explanation Education comprehension: verbalized understanding   HOME EXERCISE PROGRAM: Access Code: [email or web address]: [email or web address] Date: 12/21/2021 Prepared by: OlZeb ComfortExercises - Seated Figure 4 Piriformis Stretch  - 2 x daily - 7 x weekly - 3 reps - 30 sec hold - Standing Hip Flexor Stretch  - 2 x daily - 7 x weekly - 2 sets - 10 reps - 30 sec hold - Seated March with Resistance  - 1 x daily - 7 x weekly - 2 sets - 10 reps - 3 sec hold - Seated Isometric Hip Abduction with Resistance  - 1 x daily - 7 x  weekly - 2 sets - 10 reps - 3 sec hold - Sit to Stand Without Arm Support  - 1 x daily - 7 x weekly - 2 sets - 10 reps - Seated Ankle Dorsiflexion with Resistance  - 1 x daily - 7 x weekly - 2 sets - 10 reps - 3 sec hold - Standing Hip Abduction with Resistance at  Ankles and Counter Support  - 1 x daily - 3 x weekly - 2 sets - 10 reps - 3 sec hold - Standing Hip Extension with Resistance at Ankles and Counter Support  - 1 x daily - 3 x weekly - 2 sets - 10 reps - 3 sec hold - Marching with Resistance  - 1 x daily - 3 x weekly - 2 sets - 10 reps - 3 sec hold - Standing Terminal Knee Extension with Resistance  - 1 x daily - 3 x weekly - 2 sets - 10 reps - 5 sec hold  Patient Education - Check for Safety   ASSESSMENT:  CLINICAL IMPRESSION: "Bill" LTGs were assessed today in preparation for final visit on 12/23/21. He has showed continuous progress with improvement of both Berg Balance score (53/56) and FGA score (27/30) as well as being able to perform PWR! Moves to decrease rigidity and shuffling of gait. He states that he feels he has made steady improvement and therapy has helped him improve his dynamic balance. HEP was reviewed and consolidated so [facility] feels prepared to discharge to HEP and manage exercises on his own after 12/23/21.   OBJECTIVE IMPAIRMENTS: Abnormal gait, decreased activity tolerance, decreased balance, decreased endurance, decreased mobility, difficulty walking, decreased strength, decreased safety awareness, impaired perceived functional ability, impaired flexibility, improper body mechanics, and postural dysfunction.   ACTIVITY LIMITATIONS: carrying, lifting, standing, squatting, stairs, transfers, bed mobility, reach over head, locomotion level, and caring for others  PARTICIPATION LIMITATIONS: meal prep, cleaning, laundry, driving, shopping, and community activity  PERSONAL FACTORS: Age, Fitness, Past/current experiences, Time since onset of injury/illness/exacerbation,  and 3+ comorbidities: PPM 2 1st degree heart block & SA node dysfunction, possible CVA (ACA infarct), orthostatic hypotension, OSA, COPD, hypothyroidism, chronic PTSD, bipolar I disorder,  decreased energy/endurance since COVID-19 in ~March 2022   are also affecting patient's functional outcome.   REHAB POTENTIAL: Good  CLINICAL DECISION MAKING: Evolving/moderate complexity  EVALUATION COMPLEXITY: Moderate   GOALS: Goals reviewed with patient? Yes  SHORT TERM GOALS: Target date: 11/04/2021   Patient will be independent with initial HEP. Baseline:  Goal status: MET  11/02/21  2.  Patient will demonstrate decreased fall risk by scoring </= 15 sec on cognitive TUG. Baseline: 16.59 sec Goal status: MET  11/05/21 - Cognitive TUG = 13.57 sec  3.  Patient will be educated on strategies to decrease risk of falls.  Baseline:  Goal status: MET  11/05/21  LONG TERM GOALS: Target date: 11/25/2021, extended to 12/23/21  Patient will be independent with ongoing/advanced HEP for self-management at home incorporating PWR! Moves as indicated .  Baseline:  Goal status: MET 12/21/21  2.  Patient will ambulate at least 1000 ft with LRAD including outdoor and unlevel surfaces, independently with no LOB, for improved community gait. Baseline:  Goal status: MET  11/22/21  3.  Patient will be able to step up/down curb safely with LRAD for safety with community ambulation.  Baseline:  Goal status: MET  11/22/21   4.  Patient will improve 5x STS time to </= 16 seconds to demonstrate improved functional strength and transfer efficiency. Baseline: 19.32 sec Goal status: MET  11/25/21  5.  Patient will demonstrate at least 25/30 on FGA to improve gait stability and reduce risk for falls. (MCID = 4 points) Baseline: 17/30 Goal status: MET  12/21/21 - FGA = 27/30  6.  Patient will improve Berg score to >/= 52/56 to improve safety and stability with ADLs in  standing and reduce risk for falls. (MCID = 8  points)   Baseline: 42/56 Goal status: MET  12/21/21 - Berg = 53/56  7.  Patient will report >/= 80% on ABC scale to demonstrate improved balance confidence and decreased risk for falls. Baseline: 1060 / 1600 = 66.3% Goal status: IN PROGRESS  11/25/21 - ABC scale improved to 71.9%  8. Patient will verbalize understanding of local Parkinson's disease community resources, including community fitness post d/c. Baseline:  Goal status: MET  11/22/21   PLAN: PT FREQUENCY: 2x/week  PT DURATION: 4 weeks  PLANNED INTERVENTIONS: Therapeutic exercises, Therapeutic activity, Neuromuscular re-education, Balance training, Gait training, Patient/Family education, Self Care, Joint mobilization, Stair training, DME instructions, Manual therapy, and Re-evaluation  PLAN FOR NEXT SESSION: complete ABC scale, 5xSTS, cognitive TUG, 10MWT, d/c to [place], Student-PT 12/21/2021, 3:03 PM

## 2021-12-23 ENCOUNTER — Encounter: Payer: Self-pay | Admitting: Physical Therapy

## 2021-12-23 ENCOUNTER — Ambulatory Visit: Payer: Medicare Other | Admitting: Physical Therapy

## 2021-12-23 DIAGNOSIS — R2689 Other abnormalities of gait and mobility: Secondary | ICD-10-CM

## 2021-12-23 DIAGNOSIS — M6281 Muscle weakness (generalized): Secondary | ICD-10-CM

## 2021-12-23 DIAGNOSIS — R2681 Unsteadiness on feet: Secondary | ICD-10-CM

## 2021-12-23 DIAGNOSIS — R293 Abnormal posture: Secondary | ICD-10-CM

## 2021-12-23 NOTE — Therapy (Signed)
OUTPATIENT PHYSICAL THERAPY TREATMENT / DISCHARGE SUMMARY   Patient Name: Victor Osborne MRN: [id number] DOB:12-Oct-1932, 88 y.o., male Today's Date: 12/23/2021      PT End of Session - 12/23/21 1400     Visit Number 19    Date for PT Re-Evaluation 12/23/21    Authorization Type Medicare & [employer]    Progress Note Due on Visit --    PT Start Time 1445    PT Stop Time 1526    PT Time Calculation (min) 41 min    Activity Tolerance Patient tolerated treatment well    Behavior During Therapy WFL for tasks assessed/performed                  Past Medical History:  Diagnosis Date   Bipolar 1 disorder ([place])    COPD (chronic obstructive pulmonary disease) ([place])    Depression    First degree AV block    Hypertension    pt denies but was in medical record   Hypoglycemia, unspecified    Myalgia and myositis, unspecified    Obesity    Pneumonia 1970   Skin cancer (melanoma) ([place])    Thyroid disease    Unspecified hypothyroidism    UPJ obstruction, congenital    Past Surgical History:  Procedure Laterality Date   APPENDECTOMY  1939   malignant melanoma removed from right forehead  2008   PERMANENT PACEMAKER INSERTION N/A 06/19/2013   Procedure: PERMANENT PACEMAKER INSERTION;  Surgeon: Evans Lance, MD;  Location: [facility] CATH LAB;  Service: Cardiovascular;  Laterality: N/A;   right big toe  surgery  2009   TRANSURETHRAL RESECTION OF PROSTATE  02/04/2011   Procedure: TRANSURETHRAL RESECTION OF THE PROSTATE (TURP);  Surgeon: Dutch Gray, MD;  Location: [facility]S;  Service: Urology;  Laterality: N/A;   URETHRA SURGERY  2010   reconstruction   Patient Active Problem List   Diagnosis Date Noted   History of neoplasm 04/12/2021   Melanocytic nevi of trunk 04/12/2021   Other seborrheic keratosis 04/12/2021   Cerebral embolism with cerebral infarction 03/31/2021   Left leg weakness 03/30/2021   Chronic post-traumatic stress disorder 12/05/2017   Bipolar I disorder,  current or most recent episode depressed, in partial remission ([place]) 12/05/2017   CHB (complete heart block) ([place]) 01/27/2017   HTN (hypertension) 01/27/2017   Hypothyroidism 10/11/2016   Parkinson disease 09/08/2016   Pacemaker 09/24/2013   SSS (sick sinus syndrome) ([place]) 06/19/2013   First degree AV block 04/09/2013   Allergic rhinitis 03/17/2013   Obstructive sleep apnea 12/18/2006   COPD mixed type ([place]) 12/18/2006   History of malignant melanoma of skin 12/18/2006   Diabetes mellitus ([place]) 12/18/2006    PCP: [name], FNP   REFERRING PROVIDER: Ludwig Clarks, DO   REFERRING DIAG: G20 (ICD-10-CM) - Parkinson disease ([place])  THERAPY DIAG:  Other abnormalities of gait and mobility  Unsteadiness on feet  Muscle weakness (generalized)  Abnormal posture  RATIONALE FOR EVALUATION AND TREATMENT: Rehabilitation  ONSET DATE: Initial PD diagnosis ~16 yrs ago  NEXT MD VISIT: 04/07/2022   SUBJECTIVE:      SUBJECTIVE STATEMENT: Pt states that he has no complaints today and he is ready for his final tests.   PAIN:  Are you having pain? No   PERTINENT HISTORY:  PPM 2 1st degree heart block & SA node dysfunction, possible CVA (ACA infarct), orthostatic hypotension, OSA, COPD, hypothyroidism, chronic PTSD, bipolar I disorder,  decreased energy/endurance since COVID-19  in ~March 2022    PRECAUTIONS: Fall and ICD/Pacemaker  WEIGHT BEARING RESTRICTIONS No  FALLS: Has patient fallen in last 6 months? No  LIVING ENVIRONMENT: Lives with: lives alone and with his dog Lives in: [facility] living facility (3rd floor) Stairs:  Elevator Has following equipment at home: Single point cane and hiking pole  OCCUPATION: Retired  PLOF: Independent and Leisure: being outdoors - walking his dog, and stays active most of the day     PATIENT GOALS: "Gain more strength in my legs and gain more balance overall."   OBJECTIVE:   DIAGNOSTIC FINDINGS:   N/A  COGNITION: Overall cognitive status: Within functional limits for tasks assessed   SENSATION: WFL  COORDINATION: WFL for LE  MUSCLE TONE: WNL  MUSCLE LENGTH: Hamstrings: mod/severe tight B ITB: mod tight B Piriformis: mod tight B Hip IR: mod/severe tight B Hip flexors: mod/severe tight B Quads: mod tight R>L Heelcord: NT  POSTURE:  rounded shoulders, forward head, increased thoracic kyphosis, and flexed trunk   LOWER EXTREMITY ROM:    WFL other than limitations due to muscle tightness as indicated   LOWER EXTREMITY MMT:    MMT Right Eval Left Eval Right 11/22/21 Left 11/22/21  Hip flexion 4+ _0 Hip extension 4 Limited ROM 4+ Limited ROM 4 Limited ROM 4+   Hip abduction 4+ 4+ 4+ 4+  Hip adduction 4+ 4 4+ 4  Hip internal rotation 4+ 4+ 5 5  Hip external rotation 4+ _1 Knee flexion _2 Knee extension _3 Ankle dorsiflexion 4+ 4 5 4+  Ankle plantarflexion 5 5- 5 5  Ankle inversion      Ankle eversion      (Blank rows = not tested)  BED MOBILITY:  Sit to supine SBA Supine to sit SBA Rolling to Right SBA Rolling to Left SBA  TRANSFERS: Assistive device utilized: None  Sit to stand: Complete Independence and Modified independence - UE assist necessary from lower surface height Stand to sit: Modified independence - uncontrolled descent w/o UE assist Chair to chair: Modified independence Floor:  NT  GAIT: Gait pattern: step through pattern, decreased arm swing- Left, decreased step length- Right, decreased trunk rotation, trunk flexed, and wide BOS Distance walked: 80 Assistive device utilized: Environmental consultant - 4 wheeled and None - 10MWT/balance assessments completed w/o AD Level of assistance: Modified independence Comments: Gait speed = 3.90 ft/sec (decreased from 4.34 ft/sec as of prior discharge from PT)  RAMP: Level of Assistance:  NT Assistive device utilized:  NT Ramp Comments:   CURB:  Level of Assistance:  NT Assistive  device utilized:  NT Curb Comments:   STAIRS: (10/20/21)  Level of Assistance: SBA  Stair Negotiation Technique: Alternating Pattern  with Single Rail on Right  Number of Stairs: 14   Height of Stairs: 7  Comments: foot placement on steps with significant overhang of heel putting him at risk for posterior LOB    FUNCTIONAL TESTS:  5 times sit to stand: 19.32 sec, >16 sec indicates a fall risk for patients with PD Timed up and go (TUG): Normal = 11.06 sec; Manual = 14.04 sec; Cognitive = 16.59 sec (difficulty with cognitive task - Cognitive TUG > 15 sec = high fall risk in community dwelling older adults) 10 meter walk test: 8.41 sec (no AD); Gait speed = 3.90 ft/sec Berg Balance Scale: 42/56; 37-45 = significant risk for falls (>80%)  Functional gait assessment: 17/30; <  19 = high risk fall (10/20/21)  Scores as of discharge from last PT episode - December 2022: 5 times sit to stand: 13.75 sec Timed up and go (TUG): Normal = 9.38 sec; Manual = NT; Cognitive = 11.88 sec 10 meter walk test: 7.56 sec (no AD); Gait speed = 4.34 ft/sec Berg: 53/56; 52-55 lower (> 25%) fall risk Functional gait assessment: 27/30; 25-28 = low risk fall   PATIENT SURVEYS:  ABC scale 1060 / 1600 = 66.3%   TODAY'S TREATMENT: 12/23/21 THERAPEUTIC EXERCISE: to improve flexibility, strength and mobility.  Verbal and tactile cues throughout for technique. NuStep L4x103mn  THERAPEUTIC ACTIVITIES: TUG- 10.45 secs Manual TUG- 11.45 secs Cognitive TUG- 15.15 secs 5xSTS- 12.13 secs Gait speed- 4.37 ft/sec 10MWT- 7.5 sec ABC Scale-  1350 / 1600 = 84.4 %   12/18/21 THERAPEUTIC EXERCISE: to improve flexibility, strength and mobility.  Verbal and tactile cues throughout for technique. NuStep L5 x 557m  THERAPEUTIC ACTIVITIES: Berg Balance Scale: 53/56 FGA: 27/30 HEP review/consolidation   12/16/21 THERAPEUTIC EXERCISE: to improve flexibility, strength and mobility.  Verbal and tactile cues throughout for  technique. NuStep L6x6m10m NEUROMUSCULAR RE-EDUCATION: To improve balance, coordination, reduce fall risk, amplitude of movement, speed of movement to reduce bradykinesia, and reduce rigidity. Standing PWR! Moves- Up, Rock, Twist, Step 1x10 each- cues to leave feet stationary during PWR rock and not step out PWR! Sit to Stand 1x5- cues to lift chest up Standing Toe Tap 9" step alt R/L on Stool with chair for balance 2x10  Standing with toe taps to 9" stool on airex pad using BUE light support for balance R/L 2x10 Standing Airex in corner: -feet shoulder width with 2x15 sec rhythmic stabilization  -feet together with 2x15 sec rhythmic stabilization Forward Step with Opposite Arm Reach to Touch cone R/L 2x10- LOB 1x and minimal cueing for sequencing needed   PATIENT EDUCATION: Education details: HEP review Person educated: Patient Education method: Explanation Education comprehension: verbalized understanding   HOME EXERCISE PROGRAM: Access Code: [email or web address]: [email or web address] Date: 12/21/2021 Prepared by: OliZeb Comfortxercises - Seated Figure 4 Piriformis Stretch  - 2 x daily - 7 x weekly - 3 reps - 30 sec hold - Standing Hip Flexor Stretch  - 2 x daily - 7 x weekly - 2 sets - 10 reps - 30 sec hold - Seated March with Resistance  - 1 x daily - 7 x weekly - 2 sets - 10 reps - 3 sec hold - Seated Isometric Hip Abduction with Resistance  - 1 x daily - 7 x weekly - 2 sets - 10 reps - 3 sec hold - Sit to Stand Without Arm Support  - 1 x daily - 7 x weekly - 2 sets - 10 reps - Seated Ankle Dorsiflexion with Resistance  - 1 x daily - 7 x weekly - 2 sets - 10 reps - 3 sec hold - Standing Hip Abduction with Resistance at Ankles and Counter Support  - 1 x daily - 3 x weekly - 2 sets - 10 reps - 3 sec hold - Standing Hip Extension with Resistance at Ankles and Counter Support  - 1 x daily - 3 x weekly - 2 sets - 10 reps - 3 sec hold - Marching with Resistance  - 1 x  daily - 3 x weekly - 2 sets - 10 reps - 3 sec hold - Standing Terminal Knee Extension with Resistance  - 1 x daily - 3 x weekly -  2 sets - 10 reps - 5 sec hold  Patient Education - Check for Safety   ASSESSMENT:  CLINICAL IMPRESSION: Bill's final LTGs were assessed today and he was able to meet all of his goals. His 5 STS improved to 12.13 sec and all 3 of the TUG measures improved to show decreased fall risk. He gait speed is 4.37 ft/sec which represents a normal walking and ability to ambulate safely in the community. He reports that he is confident in his ability to perform the HEP at home and has demonstrated that with an improved score on the ABC scale to 84.4%. He is agreeable to discharge to HEP after this visit and will end this POC.    OBJECTIVE IMPAIRMENTS: Abnormal gait, decreased activity tolerance, decreased balance, decreased endurance, decreased mobility, difficulty walking, decreased strength, decreased safety awareness, impaired perceived functional ability, impaired flexibility, improper body mechanics, and postural dysfunction.   ACTIVITY LIMITATIONS: carrying, lifting, standing, squatting, stairs, transfers, bed mobility, reach over head, locomotion level, and caring for others  PARTICIPATION LIMITATIONS: meal prep, cleaning, laundry, driving, shopping, and community activity  PERSONAL FACTORS: Age, Fitness, Past/current experiences, Time since onset of injury/illness/exacerbation, and 3+ comorbidities: PPM 2 1st degree heart block & SA node dysfunction, possible CVA (ACA infarct), orthostatic hypotension, OSA, COPD, hypothyroidism, chronic PTSD, bipolar I disorder,  decreased energy/endurance since COVID-19 in ~March 2022   are also affecting patient's functional outcome.   REHAB POTENTIAL: Good  CLINICAL DECISION MAKING: Evolving/moderate complexity  EVALUATION COMPLEXITY: Moderate   GOALS: Goals reviewed with patient? Yes  SHORT TERM GOALS: Target date:  11/04/2021   Patient will be independent with initial HEP. Baseline:  Goal status: MET  11/02/21  2.  Patient will demonstrate decreased fall risk by scoring </= 15 sec on cognitive TUG. Baseline: 16.59 sec Goal status: MET  11/05/21 - Cognitive TUG = 13.57 sec  3.  Patient will be educated on strategies to decrease risk of falls.  Baseline:  Goal status: MET  11/05/21  LONG TERM GOALS: Target date: 11/25/2021, extended to 12/23/21  Patient will be independent with ongoing/advanced HEP for self-management at home incorporating PWR! Moves as indicated .  Baseline:  Goal status: MET 12/21/21  2.  Patient will ambulate at least 1000 ft with LRAD including outdoor and unlevel surfaces, independently with no LOB, for improved community gait. Baseline:  Goal status: MET  11/22/21  3.  Patient will be able to step up/down curb safely with LRAD for safety with community ambulation.  Baseline:  Goal status: MET  11/22/21   4.  Patient will improve 5x STS time to </= 16 seconds to demonstrate improved functional strength and transfer efficiency. Baseline: 19.32 sec Goal status: MET  12/23/21  5.  Patient will demonstrate at least 25/30 on FGA to improve gait stability and reduce risk for falls. (MCID = 4 points) Baseline: 17/30 Goal status: MET  12/21/21 - FGA = 27/30  6.  Patient will improve Berg score to >/= 52/56 to improve safety and stability with ADLs in standing and reduce risk for falls. (MCID = 8 points)   Baseline: 42/56 Goal status: MET  12/21/21 - Berg = 53/56  7.  Patient will report >/= 80% on ABC scale to demonstrate improved balance confidence and decreased risk for falls. Baseline: 1060 / 1600 = 66.3% Goal status: MET  12/23/21 1350 / 1600 = 84.4%  8. Patient will verbalize understanding of local Parkinson's disease community resources, including community fitness post d/c. Baseline:  Goal status: MET  11/22/21   PLAN: PT FREQUENCY: 2x/week  PT DURATION: 4  weeks  PLANNED INTERVENTIONS: Therapeutic exercises, Therapeutic activity, Neuromuscular re-education, Balance training, Gait training, Patient/Family education, Self Care, Joint mobilization, Stair training, DME instructions, Manual therapy, and Re-evaluation  PLAN FOR NEXT SESSION: d/c to HEP  PHYSICAL THERAPY DISCHARGE SUMMARY  Visits from Start of Care: 19  Current functional level related to goals / functional outcomes: Rush Landmark has improved his static and dynamic balance, functional mobility, ability to ambulate at normal gait speed, and understanding of PWR! Moves to help slow progression of PD. His improvement has decreased his fall risk and increased his ability to ambulate safely in his home and the community.   Remaining deficits: Rush Landmark has PD related deficits that will progress over time such as shuffling and decreased movement/balance but he has shown ability to use techniques provided to slow the progression.    Education / Equipment: HEP, PWR! Moves, education regard safe exercises at home, increasing mobility   Patient agrees to discharge. Patient goals were met. Patient is being discharged due to meeting the stated rehab goals.    Zeb Comfort, Student-PT 12/23/2021, 3:27 PM

## 2022-01-12 NOTE — Progress Notes (Signed)
Remote pacemaker transmission.   

## 2022-01-24 ENCOUNTER — Other Ambulatory Visit: Payer: Self-pay | Admitting: Psychiatry

## 2022-01-24 DIAGNOSIS — F3175 Bipolar disorder, in partial remission, most recent episode depressed: Secondary | ICD-10-CM

## 2022-01-24 DIAGNOSIS — F4312 Post-traumatic stress disorder, chronic: Secondary | ICD-10-CM

## 2022-01-26 ENCOUNTER — Telehealth: Payer: Self-pay | Admitting: Family

## 2022-01-26 MED ORDER — PRAVASTATIN SODIUM 80 MG PO TABS: 80.0000 mg | ORAL_TABLET | Freq: Every day | ORAL | 1 refills | Status: DC

## 2022-01-26 NOTE — Telephone Encounter (Signed)
Rx sent 

## 2022-01-26 NOTE — Telephone Encounter (Signed)
Patient is completely out

## 2022-01-26 NOTE — Addendum Note (Signed)
Addended by: Sena Hitch on: 01/26/2022 04:17 PM   Modules accepted: Orders

## 2022-01-26 NOTE — Telephone Encounter (Signed)
Prescription Request  01/26/2022  Is this a "Controlled Substance" medicine? No  LOV: 11/04/2021  What is the name of the medication or equipment? pravastatin (PRAVACHOL) 80 MG tablet -90 day supply  Have you contacted your pharmacy to request a refill? No   Which pharmacy would you like this sent to?   [id number] - [place], [place] - [address] AT [place] [address], [place] [address] Phone: [phone]  Fax: [phone]    Patient notified that their request is being sent to the clinical staff for review and that they should receive a response within 2 business days.   Please advise at Mobile [phone] (mobile)

## 2022-02-07 NOTE — Progress Notes (Unsigned)
Cardiology Office Note Date:  02/09/2022  Patient ID:  Victor Osborne 1932/07/05, MRN [id number] PCP:  [name], [place]  Cardiologist:  None Electrophysiologist: Cristopher Peru, MD   Chief Complaint: 1 year PPM follow-up; past due  History of Present Illness: Victor Osborne is a 89 y.o. male with PMH notable for CHB, SND s/p PPM, HTN, parkinson's disease, possible CVA; seen today for Cristopher Peru, MD for routine electrophysiology followup. Since last being seen in our clinic the patient reports doing well.  Early 2023, he was diagnosed with a probable CVA, had acute L leg weakness. CTA head clear, unable to get MRI d/t PPM. While in hospital, he developed orthostatic hypotension, thought to be due to Parkinson's disease. He follows regularly with neurology. He wears compression socks, has liberalized his salt intake and tries to stay hydrated.    he denies chest pain, palpitations, dyspnea, PND, orthopnea, nausea, vomiting, dizziness, syncope, edema, weight gain, or early satiety.     Device Information: Bos Sci dual chamber PPM, imp 2015; dx CHB, SND  Past Medical History:  Diagnosis Date   Bipolar 1 disorder ([place])    COPD (chronic obstructive pulmonary disease) ([place])    Depression    First degree AV block    Hypertension    pt denies but was in medical record   Hypoglycemia, unspecified    Myalgia and myositis, unspecified    Obesity    Pneumonia 1970   Skin cancer (melanoma) ([place])    Thyroid disease    Unspecified hypothyroidism    UPJ obstruction, congenital     Past Surgical History:  Procedure Laterality Date   APPENDECTOMY  1939   malignant melanoma removed from right forehead  2008   PERMANENT PACEMAKER INSERTION N/A 06/19/2013   Procedure: PERMANENT PACEMAKER INSERTION;  Surgeon: Evans Lance, MD;  Location: [facility] CATH LAB;  Service: Cardiovascular;  Laterality: N/A;   right big toe  surgery  2009   TRANSURETHRAL RESECTION OF PROSTATE   02/04/2011   Procedure: TRANSURETHRAL RESECTION OF THE PROSTATE (TURP);  Surgeon: Dutch Gray, MD;  Location: [facility];  Service: Urology;  Laterality: N/A;   URETHRA SURGERY  2010   reconstruction    Current Outpatient Medications  Medication Instructions   acetaminophen (TYLENOL) 650 mg, Oral, Every 6 hours PRN   alclomethasone (ACLOVATE) 0.05 % ointment 1 application , 2 times daily PRN   aspirin EC 325 mg, Oral, Daily   Bacillus Coagulans-Inulin (PROBIOTIC-PREBIOTIC) 1-250 BILLION-MG CAPS 1 capsule, Oral, Daily   Bee Pollen 550 mg, Oral, Daily   carbamazepine (TEGRETOL) 200 mg, Oral, 3 times daily   carbidopa-levodopa (SINEMET IR) 25-100 MG tablet 2 tablets, Oral, 3 times daily   cholecalciferol (VITAMIN D) 2,000 Units, Oral, Daily,     Coenzyme Q10 (CO Q 10 PO) 1 capsule, Oral, Daily   desoximetasone (TOPICORT) 4.62 % cream 1 application , 2 times daily   ferrous sulfate 324 mg, Oral   Flaxseed, Linseed, (FLAX SEED OIL) 1300 MG CAPS 1 capsule, Oral, Daily   FLUoxetine (PROZAC) 10 mg, Oral, Daily   levothyroxine (LEVOTHROID) 125 mcg, Oral, Daily before breakfast   MAGNESIUM PO 1 capsule, Oral, Daily   Multiple Vitamin (MULITIVITAMIN WITH MINERALS) TABS 1 tablet, Oral, Daily   Omega-3 Fatty Acids (OMEGA-3 CF PO) 1,200 mg, Oral, Daily   POTASSIUM GLUCONATE PO 1 capsule, Oral, Daily   pravastatin (PRAVACHOL) 80 mg, Oral, Daily   QUEtiapine (SEROQUEL XR) 50 mg, Oral, Daily at  bedtime   tamsulosin (FLOMAX) 0.4 mg, Oral, Daily at bedtime   TURMERIC CURCUMIN PO 1 capsule, Oral, Daily    Social History:  The patient  reports that he has quit smoking. His smoking use included cigarettes. He has a 80.00 pack-year smoking history. He has never used smokeless tobacco. He reports that he does not drink alcohol and does not use drugs.   Family History:  The patient's family history includes Cerebral aneurysm in his mother; Drug abuse in his daughter; Lung cancer in his father.  ROS:  Please see  the history of present illness. All other systems are reviewed and otherwise negative.   PHYSICAL EXAM:  VS:  BP 116/78   Pulse 60   Ht '6\' 4"'$  (1.93 m)   Wt 197 lb (89.4 kg)   SpO2 98%   BMI 23.98 kg/m  BMI: Body mass index is 23.98 kg/m.  GEN- The patient is well appearing, alert and oriented x 3 today.   HEENT: normocephalic, atraumatic; sclera clear, conjunctiva pink; hearing intact; oropharynx clear; neck supple, no JVP Lungs- Clear to ausculation bilaterally, normal work of breathing.  No wheezes, rales, rhonchi Heart- Regular rate and rhythm, no murmurs, rubs or gallops, PMI not laterally displaced GI- soft, non-tender, non-distended, bowel sounds present, no hepatosplenomegaly Extremities- No peripheral edema. no clubbing or cyanosis; DP/PT/radial pulses 2+ bilaterally MS- no significant deformity or atrophy Skin- warm and dry, no rash or lesion, device pocket well-healed Psych- euthymic mood, full affect Neuro- strength and sensation are intact   Device interrogation done today and reviewed by myself:  Battery good Lead thresholds, impedence, sensing stable  1 brief NSVT episode, less than 3 seconds No changes made today  EKG is ordered. Personal review of EKG from today shows:  AV paced, rate 61bpm  Recent Labs: 03/30/2021: BUN 25; Creatinine, Ser 1.30; Hemoglobin 14.5; Platelets 178; Potassium 4.4; Sodium 140  03/31/2021: Cholesterol 154; HDL 67; LDL Cholesterol 76; Total CHOL/HDL Ratio 2.3; Triglycerides 55; VLDL 11   CrCl cannot be calculated (Patient's most recent lab result is older than the maximum 21 days allowed.).   Wt Readings from Last 3 Encounters:  02/09/22 197 lb (89.4 kg)  11/04/21 202 lb (91.6 kg)  10/05/21 204 lb 9.6 oz (92.8 kg)     Additional studies reviewed include: Previous EP, cardiology notes.   TTE 03/31/2021  1. Left ventricular ejection fraction, by estimation, is 65 to 70%. The left ventricle has normal function. The left ventricle has  no regional wall motion abnormalities. There is mild left ventricular hypertrophy. Left ventricular diastolic parameters are consistent with Grade I diastolic dysfunction (impaired relaxation).   2. Right ventricular systolic function is normal. The right ventricular size is mildly enlarged. Tricuspid regurgitation signal is inadequate for assessing PA pressure.   3. Right atrial size was mildly dilated.   4. The mitral valve is normal in structure. No evidence of mitral valve regurgitation. No evidence of mitral stenosis.   5. The aortic valve was not well visualized. Aortic valve regurgitation is not visualized. No aortic stenosis is present.   ASSESSMENT AND PLAN:  #) SND, CHB s/p Bos Sci PPM Device functioning well, see paceart for details No changes  #) HTN  orthostatic hypotension Historically has had HTN, though at recent hospitalization had orthostatic hypotension.  BP well-controlled in office today without symptoms of orthostatic hypotension Encouraged to continue wearing compression socks, liberalize salt in diet, stay hydrated.  Discussed gatorade / electrolyte replacement, he will think about  it   Current medicines are reviewed at length with the patient today.   The patient does not have concerns regarding his medicines.  The following changes were made today:  none  Labs/ tests ordered today include:  Orders Placed This Encounter  Procedures   EKG 12-Lead     Disposition: Follow up with Dr. Lovena Le in in 12 months   Signed, Mamie Levers, NP  02/09/22  9:17 AM  Electrophysiology [facility]

## 2022-02-09 ENCOUNTER — Ambulatory Visit: Payer: Medicare Other | Attending: Student | Admitting: Cardiology

## 2022-02-09 ENCOUNTER — Encounter: Payer: Self-pay | Admitting: Student

## 2022-02-09 VITALS — BP 116/78 | HR 60 | Ht 76.0 in | Wt 197.0 lb

## 2022-02-09 DIAGNOSIS — I495 Sick sinus syndrome: Secondary | ICD-10-CM | POA: Insufficient documentation

## 2022-02-09 DIAGNOSIS — Z950 Presence of cardiac pacemaker: Secondary | ICD-10-CM | POA: Diagnosis not present

## 2022-02-09 DIAGNOSIS — I442 Atrioventricular block, complete: Secondary | ICD-10-CM | POA: Diagnosis not present

## 2022-02-09 DIAGNOSIS — I10 Essential (primary) hypertension: Secondary | ICD-10-CM | POA: Diagnosis not present

## 2022-02-09 LAB — CUP PACEART INCLINIC DEVICE CHECK
Date Time Interrogation Session: 20240124093246
Implantable Lead Connection Status: 753985
Implantable Lead Connection Status: 753985
Implantable Lead Implant Date: 20150603
Implantable Lead Implant Date: 20150603
Implantable Lead Location: 753859
Implantable Lead Location: 753860
Implantable Lead Model: 4136
Implantable Lead Model: 4137
Implantable Lead Serial Number: 29477746
Implantable Lead Serial Number: 29617326
Implantable Pulse Generator Implant Date: 20150603
Pulse Gen Serial Number: 389736

## 2022-02-09 NOTE — Patient Instructions (Signed)
Medication Instructions:   Your physician recommends that you continue on your current medications as directed. Please refer to the Current Medication list given to you today.   *If you need a refill on your cardiac medications before your next appointment, please call your pharmacy*   Lab Work:  None ordered.  If you have labs (blood work) drawn today and your tests are completely normal, you will receive your results only by: [place] (if you have MyChart) OR A paper copy in the mail If you have any lab test that is abnormal or we need to change your treatment, we will call you to review the results.   Testing/Procedures:  None ordered.   Follow-Up: At [facility], you and your health needs are our priority.  As part of our continuing mission to provide you with exceptional heart care, we have created designated Provider Care Teams.  These Care Teams include your primary Cardiologist (physician) and Advanced Practice Providers (APPs -  Physician Assistants and Nurse Practitioners) who all work together to provide you with the care you need, when you need it.  We recommend signing up for the patient portal called "MyChart".  Sign up information is provided on this After Visit Summary.  MyChart is used to connect with patients for Virtual Visits (Telemedicine).  Patients are able to view lab/test results, encounter notes, upcoming appointments, etc.  Non-urgent messages can be sent to your provider as well.   To learn more about what you can do with MyChart, go to [email or web address].    Your next appointment:   1 year(s)  Provider:   You may see Cristopher Peru, MD or one of the following Advanced Practice Providers on your designated Care Team:   Tommye Standard, Orlinda Blalock "Jonni Sanger" [place], [place] Mamie Levers, NP    Other Instructions  Your physician wants you to follow-up in: 1 year follow up.  You will receive a reminder letter in the mail two months in  advance. If you don't receive a letter, please call our office to schedule the follow-up appointment.

## 2022-02-28 ENCOUNTER — Telehealth: Payer: Self-pay | Admitting: Family

## 2022-02-28 MED ORDER — ASPIRIN 325 MG PO TBEC: 325.0000 mg | DELAYED_RELEASE_TABLET | Freq: Every day | ORAL | 3 refills | Status: DC

## 2022-02-28 NOTE — Telephone Encounter (Signed)
Rx has been sent in. 

## 2022-02-28 NOTE — Telephone Encounter (Signed)
Prescription Request  02/28/2022  Is this a "Controlled Substance" medicine? No  LOV: 11/04/2021  What is the name of the medication or equipment? aspirin 325 MG EC tablet   Have you contacted your pharmacy to request a refill? No   Which pharmacy would you like this sent to?   [facility] [id number] - [place], [place] - [address] AT [address] [place] [id number] Phone: [phone] Fax: [phone]   Patient notified that their request is being sent to the clinical staff for review and that they should receive a response within 2 business days.   Please advise at [facility] [phone]

## 2022-03-14 ENCOUNTER — Emergency Department (HOSPITAL_BASED_OUTPATIENT_CLINIC_OR_DEPARTMENT_OTHER): Payer: Medicare Other

## 2022-03-14 ENCOUNTER — Inpatient Hospital Stay (HOSPITAL_BASED_OUTPATIENT_CLINIC_OR_DEPARTMENT_OTHER)
Admission: EM | Admit: 2022-03-14 | Discharge: 2022-03-17 | DRG: 069 | Disposition: A | Discharge status: Home / Self Care | Payer: Medicare Other | Attending: Internal Medicine | Admitting: Internal Medicine

## 2022-03-14 ENCOUNTER — Other Ambulatory Visit: Payer: Self-pay

## 2022-03-14 ENCOUNTER — Encounter (HOSPITAL_BASED_OUTPATIENT_CLINIC_OR_DEPARTMENT_OTHER): Payer: Self-pay

## 2022-03-14 DIAGNOSIS — R29700 NIHSS score 0: Secondary | ICD-10-CM | POA: Diagnosis present

## 2022-03-14 DIAGNOSIS — E785 Hyperlipidemia, unspecified: Secondary | ICD-10-CM | POA: Clinically undetermined

## 2022-03-14 DIAGNOSIS — G20A1 Parkinson's disease without dyskinesia, without mention of fluctuations: Secondary | ICD-10-CM | POA: Diagnosis present

## 2022-03-14 DIAGNOSIS — E039 Hypothyroidism, unspecified: Secondary | ICD-10-CM | POA: Diagnosis present

## 2022-03-14 DIAGNOSIS — Z8673 Personal history of transient ischemic attack (TIA), and cerebral infarction without residual deficits: Secondary | ICD-10-CM

## 2022-03-14 DIAGNOSIS — G451 Carotid artery syndrome (hemispheric): Principal | ICD-10-CM | POA: Diagnosis present

## 2022-03-14 DIAGNOSIS — F4312 Post-traumatic stress disorder, chronic: Secondary | ICD-10-CM | POA: Diagnosis present

## 2022-03-14 DIAGNOSIS — G459 Transient cerebral ischemic attack, unspecified: Principal | ICD-10-CM | POA: Diagnosis present

## 2022-03-14 DIAGNOSIS — F319 Bipolar disorder, unspecified: Secondary | ICD-10-CM | POA: Diagnosis present

## 2022-03-14 DIAGNOSIS — Z87891 Personal history of nicotine dependence: Secondary | ICD-10-CM

## 2022-03-14 DIAGNOSIS — J449 Chronic obstructive pulmonary disease, unspecified: Secondary | ICD-10-CM | POA: Diagnosis present

## 2022-03-14 DIAGNOSIS — Z7982 Long term (current) use of aspirin: Secondary | ICD-10-CM

## 2022-03-14 DIAGNOSIS — Z950 Presence of cardiac pacemaker: Secondary | ICD-10-CM | POA: Diagnosis present

## 2022-03-14 DIAGNOSIS — I495 Sick sinus syndrome: Secondary | ICD-10-CM | POA: Diagnosis present

## 2022-03-14 DIAGNOSIS — N400 Enlarged prostate without lower urinary tract symptoms: Secondary | ICD-10-CM | POA: Diagnosis present

## 2022-03-14 DIAGNOSIS — G4733 Obstructive sleep apnea (adult) (pediatric): Secondary | ICD-10-CM | POA: Diagnosis present

## 2022-03-14 DIAGNOSIS — R200 Anesthesia of skin: Secondary | ICD-10-CM | POA: Diagnosis present

## 2022-03-14 DIAGNOSIS — I440 Atrioventricular block, first degree: Secondary | ICD-10-CM | POA: Diagnosis present

## 2022-03-14 DIAGNOSIS — Z801 Family history of malignant neoplasm of trachea, bronchus and lung: Secondary | ICD-10-CM

## 2022-03-14 DIAGNOSIS — Z79890 Hormone replacement therapy (postmenopausal): Secondary | ICD-10-CM

## 2022-03-14 DIAGNOSIS — R531 Weakness: Secondary | ICD-10-CM | POA: Diagnosis present

## 2022-03-14 DIAGNOSIS — Z85820 Personal history of malignant melanoma of skin: Secondary | ICD-10-CM

## 2022-03-14 DIAGNOSIS — E119 Type 2 diabetes mellitus without complications: Secondary | ICD-10-CM | POA: Diagnosis present

## 2022-03-14 DIAGNOSIS — R29818 Other symptoms and signs involving the nervous system: Secondary | ICD-10-CM | POA: Diagnosis present

## 2022-03-14 DIAGNOSIS — Z79899 Other long term (current) drug therapy: Secondary | ICD-10-CM

## 2022-03-14 DIAGNOSIS — I10 Essential (primary) hypertension: Secondary | ICD-10-CM | POA: Diagnosis present

## 2022-03-14 LAB — PROTIME-INR
INR: 1 (ref 0.8–1.2)
Prothrombin Time: 13.2 seconds (ref 11.4–15.2)

## 2022-03-14 LAB — CBC
HCT: 41.8 % (ref 39.0–52.0)
Hemoglobin: 13.7 g/dL (ref 13.0–17.0)
MCH: 32.1 pg (ref 26.0–34.0)
MCHC: 32.8 g/dL (ref 30.0–36.0)
MCV: 97.9 fL (ref 80.0–100.0)
Platelets: 176 10*3/uL (ref 150–400)
RBC: 4.27 MIL/uL (ref 4.22–5.81)
RDW: 13.5 % (ref 11.5–15.5)
WBC: 6.7 10*3/uL (ref 4.0–10.5)
nRBC: 0 % (ref 0.0–0.2)

## 2022-03-14 LAB — DIFFERENTIAL
Abs Immature Granulocytes: 0.01 10*3/uL (ref 0.00–0.07)
Basophils Absolute: 0.1 10*3/uL (ref 0.0–0.1)
Basophils Relative: 1 %
Eosinophils Absolute: 0.1 10*3/uL (ref 0.0–0.5)
Eosinophils Relative: 1 %
Immature Granulocytes: 0 %
Lymphocytes Relative: 13 %
Lymphs Abs: 0.9 10*3/uL (ref 0.7–4.0)
Monocytes Absolute: 0.6 10*3/uL (ref 0.1–1.0)
Monocytes Relative: 9 %
Neutro Abs: 5.1 10*3/uL (ref 1.7–7.7)
Neutrophils Relative %: 76 %

## 2022-03-14 LAB — GLUCOSE, CAPILLARY: Glucose-Capillary: 108 mg/dL — ABNORMAL HIGH (ref 70–99)

## 2022-03-14 LAB — COMPREHENSIVE METABOLIC PANEL
ALT: <5 U/L (ref 0–44)
AST: 20 U/L (ref 15–41)
Albumin: 3.9 g/dL (ref 3.5–5.0)
Alkaline Phosphatase: 81 U/L (ref 38–126)
Anion gap: 7 (ref 5–15)
BUN: 28 mg/dL — ABNORMAL HIGH (ref 8–23)
CO2: 24 mmol/L (ref 22–32)
Calcium: 8.7 mg/dL — ABNORMAL LOW (ref 8.9–10.3)
Chloride: 107 mmol/L (ref 98–111)
Creatinine, Ser: 1.16 mg/dL (ref 0.61–1.24)
GFR, Estimated: >60 mL/min (ref >60)
Glucose, Bld: 110 mg/dL — ABNORMAL HIGH (ref 70–99)
Potassium: 4 mmol/L (ref 3.5–5.1)
Sodium: 138 mmol/L (ref 135–145)
Total Bilirubin: 0.7 mg/dL (ref 0.3–1.2)
Total Protein: 6.8 g/dL (ref 6.5–8.1)

## 2022-03-14 LAB — ETHANOL: Alcohol, Ethyl (B): <10 mg/dL (ref <10)

## 2022-03-14 LAB — APTT: aPTT: 28 seconds (ref 24–36)

## 2022-03-14 MED ORDER — IOHEXOL 350 MG/ML SOLN
75.0000 mL | Freq: Once | INTRAVENOUS | Status: AC | PRN
  Administered 2022-03-14: 75 mL via INTRAVENOUS

## 2022-03-14 NOTE — ED Provider Notes (Signed)
Victor Osborne   CSN: [id number] Arrival date & time: 03/14/22  1959     History  Chief Complaint  Patient presents with   Extremity Weakness    Victor Osborne is a 89 y.o. male with COPD, OSA, SSS/CHB s/p pacemaker,, HTN, DM, parkinson's disease, h/o CVA, hypothyroidism, PTSD, bipolar disorder who presents with extremity weakness.   Patient reports issues with right leg weakness on/off for past couple of days. Most recent episode beginning at 10am this morning with weakness/numbness that resolved after a few hours. States the leg feels normal at this time. Similar to what happened with his left leg - had admission and w/u  Girlfriend reported to EMS that patient has altered speech compared to normal but patient denies this now. Spouse reports balance issues past few days.  Patient denies that now.  He also denies recent fever/chills, headache, changes to his vision, trouble speaking or swallowing, chest pain, shortness of breath, abdominal pain, nausea vomiting diarrhea constipation, urinary symptoms, lower extremity edema, lower extremity pain.  Does not take any anticoagulation. Per chart review patient was admitted from 03/30/2021 to 04/02/2021 after episodic left lower extremity weakness/numbness causing him to fall and hit his head.  Continue to have numbness and tingling of his legs.  CT head was normal, CT angio head and neck with no LVO or stenosis.  MRI and compatible with his pacemaker.  He was admitted and diagnosed with TIA.    Extremity Weakness      Home Medications Prior to Admission medications   Medication Sig Start Date End Date Taking? Authorizing Provider  acetaminophen (TYLENOL) 325 MG tablet Take 650 mg by mouth every 6 (six) hours as needed.    [provider]  alclomethasone (ACLOVATE) 0.05 % ointment Apply 1 application topically 2 (two) times daily as needed (for rash).    [provider]  aspirin EC 325 MG tablet Take 1 tablet (325 mg total) by mouth daily. 02/28/22   [name], FNP  carbamazepine (TEGRETOL) 200 MG tablet Take 1 tablet (200 mg total) by mouth 3 (three) times daily. 09/16/21   Thayer Headings, PMHNP  carbidopa-levodopa (SINEMET IR) 25-100 MG tablet Take 2 tablets by mouth 3 (three) times daily. 10/11/21   Tat, Eustace Quail, DO  cholecalciferol (VITAMIN D) 1000 UNITS tablet Take 2,000 Units by mouth daily.    [provider]  Coenzyme Q10 (CO Q 10 PO) Take 1 capsule by mouth daily.    [provider]  desoximetasone (TOPICORT) 0.25 % cream Apply 1 application topically 2 (two) times daily.    [provider]  ferrous sulfate 324 (65 Fe) MG TBEC Take 324 mg by mouth.    [provider]  Flaxseed, Linseed, (FLAX SEED OIL) 1300 MG CAPS Take 1 capsule by mouth daily.    [provider]  FLUoxetine (PROZAC) 10 MG capsule TAKE 1 CAPSULE EVERY DAY 01/25/22   Thayer Headings, PMHNP  levothyroxine (LEVOTHROID) 125 MCG tablet Take 1 tablet (125 mcg total) by mouth daily before breakfast. 11/04/21   [name], FNP  MAGNESIUM PO Take 1 capsule by mouth daily.    [provider]  Multiple Vitamin (MULITIVITAMIN WITH MINERALS) TABS Take 1 tablet by mouth daily.    [provider]  Omega-3 Fatty Acids (OMEGA-3 CF PO) Take 1,200 mg by mouth daily.    [provider]  POTASSIUM GLUCONATE PO Take 1 capsule by mouth  daily.    [provider]  pravastatin (PRAVACHOL) 80 MG tablet Take 1 tablet (80 mg total) by mouth daily. 01/26/22   [name], FNP  QUEtiapine (SEROQUEL XR) 50 MG TB24 24 hr tablet Take 1 tablet (50 mg total) by mouth at bedtime. 09/16/21   Thayer Headings, PMHNP  tamsulosin (FLOMAX) 0.4 MG CAPS capsule Take 0.4 mg by mouth at bedtime. 02/15/21   [provider]  TURMERIC CURCUMIN PO Take 1 capsule by mouth daily.    [provider]      Allergies    Patient has no known allergies.    Review of Systems   Review of Systems  Musculoskeletal:  Positive for extremity weakness.   Review of systems Negative for f/c.  A 10 point review of systems was performed and is negative unless otherwise reported in HPI.  Physical Exam Updated Vital Signs BP (!) 192/70 (BP Location: Right Arm)   Pulse 60   Temp 97.7 F (36.5 C) (Oral)   Resp 15   Ht '6\' 4"'$  (1.93 m)   Wt 85.5 kg   SpO2 99%   BMI 22.94 kg/m  Physical Exam General: Normal appearing male, lying in bed.  HEENT: PERRLA, EOMI, no nystagmus, Sclera anicteric, MMM, trachea midline, clear oropharynx. Cardiology: RRR, no murmurs/rubs/gallops. BL radial and DP pulses equal bilaterally.  Resp: Normal respiratory rate and effort. CTAB, no wheezes, rhonchi, crackles.  Abd: Soft, non-tender, non-distended. No rebound tenderness or guarding.  GU: Deferred. MSK: No peripheral edema or signs of trauma. Extremities without deformity or TTP. No cyanosis or clubbing. Skin: warm, dry. No rashes or lesions. Back: No CVA tenderness Neuro: A&Ox4, CNs II-XII grossly intact. 5/5 strength in all extremities. Sensation grossly intact. Normal speech, tongue protrudes midline, normal coordination and gait.  Psych: Normal mood and affect.   1a  Level of consciousness: 0=alert; keenly responsive  1b. LOC questions:  0=Performs both tasks correctly  1c. LOC commands: 0=Performs both tasks correctly  2.  Best Gaze: 0=normal  3.  Visual: 0=No visual loss  4. Facial Palsy: 0=Normal symmetric movement  5a.  Motor left arm: 0=No drift, limb holds 90 (or 45) degrees for full 10 seconds  5b.  Motor right arm: 0=No drift, limb holds 90 (or 45) degrees for full 10 seconds  6a. motor left leg: 0=No drift, limb holds 90 (or 45) degrees for full 10 seconds  6b  Motor right leg:  0=No drift, limb holds 90 (or 45) degrees for full 10 seconds  7. Limb Ataxia: 0=Absent  8.  Sensory: 0=Normal; no  sensory loss  9. Best Language:  0=No aphasia, normal  10. Dysarthria: 0=Normal  11. Extinction and Inattention: 0=No abnormality   Total:   0        ED Results / Procedures / Treatments   Labs (all labs ordered are listed, but only abnormal results are displayed) Labs Reviewed  GLUCOSE, CAPILLARY - Abnormal; Notable for the following components:      Result Value   Glucose-Capillary 108 (*)    All other components within normal limits  PROTIME-INR  APTT  CBC  DIFFERENTIAL  COMPREHENSIVE METABOLIC PANEL  ETHANOL  CBG MONITORING, ED    EKG None  Radiology No results found.  Procedures Procedures    Medications Ordered in ED Medications  clopidogrel (PLAVIX) tablet 75 mg (75 mg Oral Given 03/17/22 1050)  aspirin EC tablet 81 mg (81 mg Oral Given 03/17/22 1050)  iohexol (OMNIPAQUE) 350  MG/ML injection 75 mL (75 mLs Intravenous Contrast Given 03/14/22 2145)   stroke: early stages of recovery book ( Does not apply Given 03/16/22 0919)  calcium gluconate 1 g/ 50 mL sodium chloride IVPB (1,000 mg Intravenous New Bag/Given 03/15/22 1133)    ED Course/ Medical Decision Making/ A&P                          Medical Decision Making Amount and/or Complexity of Data Reviewed Labs: ordered. Decision-making details documented in ED Course. Radiology: ordered. Decision-making details documented in ED Course.  Risk Prescription drug management. Decision regarding hospitalization.    This patient presents to the ED for concern of RLE weakness/numbness; this involves an extensive number of treatment options, and is a complaint that carries with it a high risk of complications and morbidity.  I considered the following differential and admission for this acute, potentially life threatening condition.   MDM:    For patient's episodic right lower extremity numbness/weakness consider TIA versus CVA will obtain CT head and CTA head and neck.  Patient's glucose 108.  He is not stroke  coded due to feeling completely normal at this time, symptoms have resolved, NIH stroke scale is 0.  Consider possible TIA which is what he was diagnosed with per chart review when he had similar symptoms in 2023 of his left lower extremity.  He has intact peripheral pulses and feet are warm with good cap refill, no concern for ischemic etiology in the lower extremity.  Also considered possible acute aortic syndrome causing his symptoms however he has no chest or abdominal pain to indicate this.  Consider electrolyte derangements, hypo-/hyperglycemia.  No seizure-like activity to suggest Todd's paralysis.  Will obtain imaging and labs and continue to monitor patient.  Clinical Course as of 03/17/22 1532  Mon Mar 14, 2022  2057 Glucose-Capillary(!): 108 [HN]  2141 Alcohol, Ethyl (B): <10 [HN]  2141 INR: 1.0 [HN]  2141 Prothrombin Time: 13.2 [HN]  2141 APTT: 28 [HN]  2141 CBC wnl [HN]  2141 Comprehensive metabolic panel(!) Unremarkable [HN]  2141 CT HEAD WO CONTRAST IMPRESSION: 1. No evidence of acute intracranial abnormality. 2. Chronic small vessel ischemic disease and cerebral atrophy.   [HN]  2142 EKG demonstrates junctional rhythm that is not paced with diffuse inverted T waves.  Prior EKGs demonstrate paced rhythm.  Patient has no chest pain, palpitations, shortness of breath at this time.  He feels totally at his baseline.  I have low concern for ACS. [HN]  2323 D/w neurology who states that patient w/ TIA can be admitted for observation overnight and add plavix to his regimen. Consulted to hospitalist. [HN]    Clinical Course User Index [HN] Audley Hose, MD    Labs: I Ordered, and personally interpreted labs.  The pertinent results include:  those listed above  Imaging Studies ordered: I ordered imaging studies including CTH, CTA H&N I independently visualized and interpreted imaging. I agree with the radiologist interpretation  Additional history obtained from chart  review.    Cardiac Monitoring: The patient was maintained on a cardiac monitor.  I personally viewed and interpreted the cardiac monitored which showed an underlying rhythm of: paced rhythm  Reevaluation: After the interventions noted above, I reevaluated the patient and found that they have :stayed the same  Social Determinants of Health: Patient lives independently   Disposition:  Transfer for admission for TIA w/u, MRI  Co morbidities that complicate the patient  evaluation  Past Medical History:  Diagnosis Date   Bipolar 1 disorder ([place])    COPD (chronic obstructive pulmonary disease) (HCC)    Depression    First degree AV block    Hypertension    pt denies but was in medical record   Hypoglycemia, unspecified    Myalgia and myositis, unspecified    Obesity    Pneumonia 1970   Skin cancer (melanoma) ([place])    Thyroid disease    Unspecified hypothyroidism    UPJ obstruction, congenital      Medicines No orders of the defined types were placed in this encounter.   I have reviewed the patients home medicines and have made adjustments as needed  Problem List / ED Course: Problem List Items Addressed This Visit       Cardiovascular and Mediastinum   * (Principal) TIA (transient ischemic attack) - Primary   Relevant Medications   aspirin EC 81 MG tablet                This Osborne was created using dictation software, which may contain spelling or grammatical errors.    Audley Hose, MD 03/23/22 [phone]

## 2022-03-14 NOTE — ED Triage Notes (Signed)
PA evaluating patient in triage.

## 2022-03-14 NOTE — Progress Notes (Signed)
Plan of Care Note for accepted transfer   Patient: Victor Osborne MRN: [id number]   [place]: 03/14/2022  Facility requesting transfer: [facility] ED Requesting Provider: Dr. Mayra Neer Reason for transfer: TIA workup  Facility course: The patient is a 89 year old male with past medical history significant for sick sinus syndrome, complete heart block status post pacemaker placement not compatible with MRI, COPD, OSA, hypertension, type 2 diabetes, Parkinson's disease, history of CVA, hypothyroidism, PTSD, bipolar disorder, who presented to the ED with on and off right lower extremity weakness for the past 2 days.  With most recent episode, his last known well was around 10 AM.  On full dose aspirin and pravastatin at home.  In the ED, unable to obtain an MRI brain due to noncompatible pacemaker.  Seen by teleneurologist.  Symptoms have resolved.  Noncontrast CT head and CT angio of the head and neck were nonacute.  Teleneurologist recommended admission for TIA workup.  Admitted to [facility] telemetry medical unit as observation status.  Please contact neurohospitalist once the patient arrives to the hospital.  Plan of care: The patient is accepted for admission to Telemetry unit, at [facility].  Observation status.  Author: Kayleen Memos, DO 03/14/2022  Check [email or web address] for on-call coverage.  Nursing staff, Please call [place] number on Amion as soon as patient's arrival, so appropriate admitting provider can evaluate the pt.

## 2022-03-14 NOTE — Consult Note (Signed)
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 Based on his history, I suspect we dealing with left TIA. I would suggest adding Plavix to the regimen. He cannot get an MRI of the head. CT angiogram of the head and neck has been ordered. Will recommend echocardiogram, telemonitoring, PT and OT evals. Not a case for the lytic based on the resolution of symptoms.   Recommendations: Our recommendations are outlined below.  Laboratory Studies : Lipid panel Please order Hemoglobin A1c Please order  Antithrombotic Medication : Clopidogrel 75 mg daily Please order  Nursing Recommendations : IV Fluids, avoid dextrose containing fluids, Maintain euglycemiaNeuro checks q4 hrs x 24 hrs and then per shiftHead of bed 30 degreesContinue with Telemetry  Consultations : Recommend Speech therapy if failed dysphagia screenPhysical therapy/Occupational therapy  DVT Prophylaxis : Choice of Primary Team  Disposition : Neurology will follow   ----------------------------------------------------------------------------------------------------    Metrics: [facility] Notification Time: 03/14/2022 22:29:06 Stamp Time: 03/14/2022 22:31:03 Callback Response Time: 03/14/2022 22:30:41  Primary Provider Notified of Diagnostic Impression and Management Plan on: 03/14/2022 23:17:06   CT HEAD: As Per Radiologist CT Head Showed No Acute Hemorrhage or Acute Core Infarct    ----------------------------------------------------------------------------------------------------  Chief Complaint: Right leg numbness  History of Present Illness: Patient is a 89 year old Male. Pleasant 89 year old  male with past medical history of hypertension, hyperlipidemia came to the hospital because of right leg weakness. Patient reports that he had few episodes of this intermittent. He denies any pain with it. Denies any speech or swallowing problems. 50 visual symptoms. No numbness or tingling. He is taking aspirin every day. Denies any side effects with this.   Past Medical History:      Hypertension      Hyperlipidemia  Medications:  No Anticoagulant use  Antiplatelet use: Yes Aspirin Reviewed EMR for current medications  Allergies:  Reviewed  Social History: Smoking: No Alcohol Use: No Drug Use: No  Family History:  There is no family history of premature cerebrovascular disease pertinent to this consultation  ROS : 14 Points Review of Systems was performed and was negative except mentioned in HPI.  Past Surgical History: There Is No Surgical History Contributory To Today's Visit   Examination: BP(169/70), Pulse(60), 1A: Level of Consciousness - Alert; keenly responsive + 0 1B: Ask Month and Age - Both Questions Right + 0 1C: Blink Eyes & Squeeze Hands - Performs Both Tasks + 0 2: Test Horizontal Extraocular Movements - Normal + 0 3: Test Visual Fields - No Visual Loss + 0 4: Test Facial Palsy (Use Grimace if Obtunded) - Normal symmetry + 0 5A: Test Left Arm Motor Drift - No Drift for 10 Seconds + 0 5B: Test Right Arm Motor Drift - No Drift for 10 Seconds + 0 6A: Test Left Leg Motor Drift - No Drift for 5 Seconds + 0 6B: Test Right Leg Motor Drift - No Drift for 5 Seconds + 0 7: Test Limb Ataxia (FNF/Heel-Shin) - No Ataxia + 0 8: Test Sensation - Normal; No sensory loss + 0 9: Test Language/Aphasia - Normal; No aphasia + 0 10: Test Dysarthria - Normal + 0 11: Test Extinction/Inattention - No abnormality + 0  NIHSS Score: 0  Spoke with : Dr Mayra Neer    Patient / Family was informed the Neurology Consult would occur via TeleHealth consult by way of interactive audio  and video telecommunications and consented to receiving care in this manner.  Patient is being evaluated for possible acute neurologic impairment and high probability of imminent or life - threatening deterioration.I spent total of 35 minutes providing care to this patient, including time for face to face visit via telemedicine, review of medical records, imaging studies and discussion of findings with providers, the patient and / or family.   Dr Faustino Congress   [facility] For Inpatient follow-up with [facility] physician please call [facility] [phone]. This is not an outpatient service. Post hospital discharge, please contact hospital directly.  Please do not communicate with [facility] physicians via secure chat. If you have any questions, Please contact [facility]. Please call or reconsult our service if there are any clinical or diagnostic changes.

## 2022-03-14 NOTE — ED Notes (Signed)
Pt. Has had his tele neuro and will be admitted per the Neuro Dr.

## 2022-03-14 NOTE — ED Triage Notes (Signed)
PT reports issues with right leg weakness on/off for past couple of days. Most recent episode beginning at 10am this morning. Pt exhibiting mild weakness in right lower extremity along with mild decreased sensation in right lower extremity. Spouse reports patient has altered speech compared to normal. No obvious slurring noted at this time. Spouse reports balance issues past few days. No cognitive issues.

## 2022-03-14 NOTE — ED Triage Notes (Signed)
PT not on blood thinners at this time.

## 2022-03-15 ENCOUNTER — Ambulatory Visit (INDEPENDENT_AMBULATORY_CARE_PROVIDER_SITE_OTHER): Payer: Medicare Other

## 2022-03-15 ENCOUNTER — Inpatient Hospital Stay (HOSPITAL_COMMUNITY): Payer: Medicare Other

## 2022-03-15 ENCOUNTER — Observation Stay (HOSPITAL_COMMUNITY): Payer: Medicare Other

## 2022-03-15 DIAGNOSIS — Z7982 Long term (current) use of aspirin: Secondary | ICD-10-CM | POA: Diagnosis not present

## 2022-03-15 DIAGNOSIS — I495 Sick sinus syndrome: Secondary | ICD-10-CM | POA: Diagnosis present

## 2022-03-15 DIAGNOSIS — F319 Bipolar disorder, unspecified: Secondary | ICD-10-CM

## 2022-03-15 DIAGNOSIS — R531 Weakness: Secondary | ICD-10-CM | POA: Diagnosis present

## 2022-03-15 DIAGNOSIS — E119 Type 2 diabetes mellitus without complications: Secondary | ICD-10-CM | POA: Diagnosis present

## 2022-03-15 DIAGNOSIS — J449 Chronic obstructive pulmonary disease, unspecified: Secondary | ICD-10-CM

## 2022-03-15 DIAGNOSIS — E032 Hypothyroidism due to medicaments and other exogenous substances: Secondary | ICD-10-CM

## 2022-03-15 DIAGNOSIS — G459 Transient cerebral ischemic attack, unspecified: Secondary | ICD-10-CM

## 2022-03-15 DIAGNOSIS — E785 Hyperlipidemia, unspecified: Secondary | ICD-10-CM | POA: Diagnosis present

## 2022-03-15 DIAGNOSIS — Z801 Family history of malignant neoplasm of trachea, bronchus and lung: Secondary | ICD-10-CM | POA: Diagnosis not present

## 2022-03-15 DIAGNOSIS — Z79890 Hormone replacement therapy (postmenopausal): Secondary | ICD-10-CM | POA: Diagnosis not present

## 2022-03-15 DIAGNOSIS — G20A1 Parkinson's disease without dyskinesia, without mention of fluctuations: Secondary | ICD-10-CM | POA: Diagnosis present

## 2022-03-15 DIAGNOSIS — F4312 Post-traumatic stress disorder, chronic: Secondary | ICD-10-CM | POA: Diagnosis present

## 2022-03-15 DIAGNOSIS — N400 Enlarged prostate without lower urinary tract symptoms: Secondary | ICD-10-CM | POA: Diagnosis present

## 2022-03-15 DIAGNOSIS — I440 Atrioventricular block, first degree: Secondary | ICD-10-CM | POA: Diagnosis present

## 2022-03-15 DIAGNOSIS — E039 Hypothyroidism, unspecified: Secondary | ICD-10-CM | POA: Diagnosis present

## 2022-03-15 DIAGNOSIS — I10 Essential (primary) hypertension: Secondary | ICD-10-CM | POA: Diagnosis present

## 2022-03-15 DIAGNOSIS — Z950 Presence of cardiac pacemaker: Secondary | ICD-10-CM | POA: Diagnosis not present

## 2022-03-15 DIAGNOSIS — R29818 Other symptoms and signs involving the nervous system: Secondary | ICD-10-CM | POA: Diagnosis present

## 2022-03-15 DIAGNOSIS — Z87891 Personal history of nicotine dependence: Secondary | ICD-10-CM | POA: Diagnosis not present

## 2022-03-15 DIAGNOSIS — Z8673 Personal history of transient ischemic attack (TIA), and cerebral infarction without residual deficits: Secondary | ICD-10-CM | POA: Diagnosis not present

## 2022-03-15 DIAGNOSIS — Z85820 Personal history of malignant melanoma of skin: Secondary | ICD-10-CM | POA: Diagnosis not present

## 2022-03-15 DIAGNOSIS — G4733 Obstructive sleep apnea (adult) (pediatric): Secondary | ICD-10-CM | POA: Diagnosis present

## 2022-03-15 DIAGNOSIS — Z79899 Other long term (current) drug therapy: Secondary | ICD-10-CM | POA: Diagnosis not present

## 2022-03-15 DIAGNOSIS — G451 Carotid artery syndrome (hemispheric): Secondary | ICD-10-CM | POA: Diagnosis present

## 2022-03-15 LAB — LIPID PANEL
Cholesterol: 159 mg/dL (ref 0–200)
HDL: 75 mg/dL (ref >40)
LDL Cholesterol: 72 mg/dL (ref 0–99)
Total CHOL/HDL Ratio: 2.1 RATIO
Triglycerides: 59 mg/dL (ref <150)
VLDL: 12 mg/dL (ref 0–40)

## 2022-03-15 LAB — CBC
HCT: 37.2 % — ABNORMAL LOW (ref 39.0–52.0)
Hemoglobin: 12.5 g/dL — ABNORMAL LOW (ref 13.0–17.0)
MCH: 33 pg (ref 26.0–34.0)
MCHC: 33.6 g/dL (ref 30.0–36.0)
MCV: 98.2 fL (ref 80.0–100.0)
Platelets: 160 10*3/uL (ref 150–400)
RBC: 3.79 MIL/uL — ABNORMAL LOW (ref 4.22–5.81)
RDW: 13.5 % (ref 11.5–15.5)
WBC: 5.6 10*3/uL (ref 4.0–10.5)
nRBC: 0 % (ref 0.0–0.2)

## 2022-03-15 LAB — SEDIMENTATION RATE: Sed Rate: 5 mm/hr (ref 0–16)

## 2022-03-15 LAB — CARBAMAZEPINE LEVEL, TOTAL: Carbamazepine Lvl: 5 ug/mL (ref 4.0–12.0)

## 2022-03-15 LAB — C-REACTIVE PROTEIN: CRP: 0.6 mg/dL (ref <1.0)

## 2022-03-15 LAB — ECHOCARDIOGRAM COMPLETE
Height: 76 in
Weight: 3132.3 oz

## 2022-03-15 LAB — CREATININE, SERUM
Creatinine, Ser: 0.99 mg/dL (ref 0.61–1.24)
GFR, Estimated: >60 mL/min (ref >60)

## 2022-03-15 LAB — TSH: TSH: 4.142 u[IU]/mL (ref 0.350–4.500)

## 2022-03-15 MED ORDER — FLUOXETINE HCL 10 MG PO CAPS
10.0000 mg | ORAL_CAPSULE | Freq: Every day | ORAL | Status: DC
  Administered 2022-03-15 – 2022-03-17 (×3): 10 mg via ORAL
  Filled 2022-03-15 (×3): qty 1

## 2022-03-15 MED ORDER — ASPIRIN 325 MG PO TBEC
325.0000 mg | DELAYED_RELEASE_TABLET | Freq: Every day | ORAL | Status: DC
  Administered 2022-03-15 – 2022-03-16 (×2): 325 mg via ORAL
  Filled 2022-03-15 (×2): qty 1

## 2022-03-15 MED ORDER — TAMSULOSIN HCL 0.4 MG PO CAPS
0.4000 mg | ORAL_CAPSULE | Freq: Every day | ORAL | Status: DC
  Administered 2022-03-15 – 2022-03-16 (×2): 0.4 mg via ORAL
  Filled 2022-03-15 (×2): qty 1

## 2022-03-15 MED ORDER — PRAVASTATIN SODIUM 40 MG PO TABS
80.0000 mg | ORAL_TABLET | Freq: Every day | ORAL | Status: DC
  Administered 2022-03-15 – 2022-03-17 (×3): 80 mg via ORAL
  Filled 2022-03-15 (×3): qty 2

## 2022-03-15 MED ORDER — LEVOTHYROXINE SODIUM 25 MCG PO TABS
125.0000 ug | ORAL_TABLET | Freq: Every day | ORAL | Status: DC
  Administered 2022-03-15 – 2022-03-17 (×3): 125 ug via ORAL
  Filled 2022-03-15 (×3): qty 1

## 2022-03-15 MED ORDER — ACETAMINOPHEN 325 MG PO TABS: 650.0000 mg | ORAL_TABLET | ORAL | Status: DC | PRN

## 2022-03-15 MED ORDER — CARBAMAZEPINE 200 MG PO TABS
200.0000 mg | ORAL_TABLET | Freq: Three times a day (TID) | ORAL | Status: DC
  Administered 2022-03-15 – 2022-03-17 (×7): 200 mg via ORAL
  Filled 2022-03-15 (×9): qty 1

## 2022-03-15 MED ORDER — HYDRALAZINE HCL 20 MG/ML IJ SOLN: 5.0000 mg | INTRAMUSCULAR | Status: DC | PRN

## 2022-03-15 MED ORDER — CLOPIDOGREL BISULFATE 75 MG PO TABS
75.0000 mg | ORAL_TABLET | Freq: Every day | ORAL | Status: DC
  Administered 2022-03-15 – 2022-03-17 (×3): 75 mg via ORAL
  Filled 2022-03-15 (×3): qty 1

## 2022-03-15 MED ORDER — ACETAMINOPHEN 160 MG/5ML PO SOLN: 650.0000 mg | ORAL | Status: DC | PRN

## 2022-03-15 MED ORDER — CARBIDOPA-LEVODOPA 25-100 MG PO TABS
2.0000 | ORAL_TABLET | Freq: Three times a day (TID) | ORAL | Status: DC
  Administered 2022-03-15 – 2022-03-17 (×7): 2 via ORAL
  Filled 2022-03-15 (×7): qty 2

## 2022-03-15 MED ORDER — ACETAMINOPHEN 650 MG RE SUPP: 650.0000 mg | RECTAL | Status: DC | PRN

## 2022-03-15 MED ORDER — QUETIAPINE FUMARATE ER 50 MG PO TB24
50.0000 mg | ORAL_TABLET | Freq: Every day | ORAL | Status: DC
  Administered 2022-03-15 – 2022-03-16 (×2): 50 mg via ORAL
  Filled 2022-03-15 (×3): qty 1

## 2022-03-15 MED ORDER — SENNOSIDES-DOCUSATE SODIUM 8.6-50 MG PO TABS: 1.0000 | ORAL_TABLET | Freq: Every evening | ORAL | Status: DC | PRN

## 2022-03-15 MED ORDER — STROKE: EARLY STAGES OF RECOVERY BOOK
Freq: Once | Status: AC
  Filled 2022-03-15: qty 1

## 2022-03-15 MED ORDER — CALCIUM GLUCONATE-NACL 1-0.675 GM/50ML-% IV SOLN
1.0000 g | Freq: Once | INTRAVENOUS | Status: AC
  Administered 2022-03-15: 1000 mg via INTRAVENOUS
  Filled 2022-03-15: qty 50

## 2022-03-15 MED ORDER — ENOXAPARIN SODIUM 40 MG/0.4ML IJ SOSY
40.0000 mg | PREFILLED_SYRINGE | INTRAMUSCULAR | Status: DC
  Administered 2022-03-15 – 2022-03-17 (×3): 40 mg via SUBCUTANEOUS
  Filled 2022-03-15 (×3): qty 0.4

## 2022-03-15 NOTE — Progress Notes (Signed)
Patient seen by telestroke ([facility]) at med center and transferred here for stroke workup. Will need repeat head CT tonight, unable to get MRI 2/2 PM. Stroke team to f/u in AM.  Su Monks, MD [facility] [phone]  If 7pm- 7am, please page neurology on call as listed in [place].

## 2022-03-15 NOTE — Progress Notes (Signed)
SLP Cancellation Note  Patient Details Name: Victor Osborne MRN: [id number] DOB: 10/07/1932   Cancelled treatment:       Reason Eval/Treat Not Completed: Patient at procedure or test/unavailable  Gabriel Rainwater MA, CCC-SLP  [name] Meryl 03/15/2022, 10:50 AM

## 2022-03-15 NOTE — Progress Notes (Signed)
Echocardiogram 2D Echocardiogram has been performed.  Victor Osborne 03/15/2022, 11:10 AM

## 2022-03-15 NOTE — Evaluation (Signed)
Speech Language Pathology Evaluation Patient Details Name: Victor Osborne MRN: [id number] DOB: 08-07-1932 Today's Date: 03/15/2022 Time: [id number] SLP Time Calculation (min) (ACUTE ONLY): 11 min  Problem List:  Patient Active Problem List   Diagnosis Date Noted   TIA (transient ischemic attack) 03/14/2022   History of neoplasm 04/12/2021   Melanocytic nevi of trunk 04/12/2021   Other seborrheic keratosis 04/12/2021   Cerebral embolism with cerebral infarction 03/31/2021   Left leg weakness 03/30/2021   Chronic post-traumatic stress disorder 12/05/2017   Bipolar I disorder, current or most recent episode depressed, in partial remission ([place]) 12/05/2017   CHB (complete heart block) ([place]) 01/27/2017   HTN (hypertension) 01/27/2017   Hypothyroidism 10/11/2016   Parkinson disease 09/08/2016   Chronic osteoarthritis 03/20/2015   Bipolar 1 disorder ([place]) 03/20/2015   Tremor 03/20/2015   Pacemaker 09/24/2013   SSS (sick sinus syndrome) ([place]) 06/19/2013   First degree AV block 04/09/2013   Allergic rhinitis 03/17/2013   Obstructive sleep apnea 12/18/2006   COPD mixed type ([place]) 12/18/2006   History of malignant melanoma of skin 12/18/2006   Diabetes mellitus ([place]) 12/18/2006   Past Medical History:  Past Medical History:  Diagnosis Date   Bipolar 1 disorder ([place])    COPD (chronic obstructive pulmonary disease) ([place])    Depression    First degree AV block    Hypertension    pt denies but was in medical record   Hypoglycemia, unspecified    Myalgia and myositis, unspecified    Obesity    Pneumonia 1970   Skin cancer (melanoma) ([place])    Thyroid disease    Unspecified hypothyroidism    UPJ obstruction, congenital    Past Surgical History:  Past Surgical History:  Procedure Laterality Date   APPENDECTOMY  1939   malignant melanoma removed from right forehead  2008   PERMANENT PACEMAKER INSERTION N/A 06/19/2013   Procedure: PERMANENT PACEMAKER INSERTION;  Surgeon: Evans Lance, MD;  Location: [facility] CATH LAB;  Service: Cardiovascular;  Laterality: N/A;   right big toe  surgery  2009   TRANSURETHRAL RESECTION OF PROSTATE  02/04/2011   Procedure: TRANSURETHRAL RESECTION OF THE PROSTATE (TURP);  Surgeon: Dutch Gray, MD;  Location: [facility] ORS;  Service: Urology;  Laterality: N/A;   URETHRA SURGERY  2010   reconstruction   HPI:  89 year old male with past medical history significant for sick sinus syndrome, complete heart block status post pacemaker placement not compatible with MRI, COPD, OSA, hypertension, type 2 diabetes, Parkinson's disease, history of CVA, hypothyroidism, PTSD, bipolar disorder, who presented to the ED with on and off right lower extremity weakness for the past 2 days.  With most recent episode, his last known well was around 10 AM.  Suspected TIA, unable to complete MRI. Per MD notes, symptoms have resolved.   Assessment / Plan / Recommendation Clinical Impression  Cognitive linguistic evaluation complete. Patient [facility] for all areas addressed. He is HOH. No further SLP needs indicated at this time.    SLP Assessment  SLP Recommendation/Assessment: Patient does not need any further Speech [place] [facility] Services SLP Visit Diagnosis: Dysarthria and anarthria (R47.1)    Recommendations for follow up therapy are one component of a multi-disciplinary discharge planning process, led by the attending physician.  Recommendations may be updated based on patient status, additional functional criteria and insurance authorization.    Follow Up Recommendations  No SLP follow up  SLP Evaluation Cognition  Overall Cognitive Status: Within Functional Limits for tasks assessed       Comprehension  Auditory Comprehension Overall Auditory Comprehension: Appears within functional limits for tasks assessed Visual Recognition/Discrimination Discrimination: Within Function Limits Reading Comprehension Reading Status: Within funtional  limits    Expression Expression Primary Mode of Expression: Verbal Verbal Expression Overall Verbal Expression: Appears within functional limits for tasks assessed Written Expression Dominant Hand: Right   Oral / Motor  Oral Motor/Sensory Function Overall Oral Motor/Sensory Function: Within functional limits Motor Speech Overall Motor Speech: Appears within functional limits for tasks assessed           Gabriel Rainwater MA, CCC-SLP  Fiona Coto Meryl 03/15/2022, 11:47 AM

## 2022-03-15 NOTE — Evaluation (Signed)
Occupational Therapy Evaluation Patient Details Name: Victor Osborne MRN: [id number] DOB: 1932/12/16 Today's Date: 03/15/2022   History of Present Illness Pt is an 89 y/o M presenting to ED on 2/26 with RLE weakness, admitted for TIA. CT head negative for acute intracranial abnormality, MRI pending. PMH includes sick sinus syndrome, complete heart block s/p pacemaker, COPD, OSA, HTN, DM2, Parkinson's Disease, CVA, hypothyroidism, PTSD, bipolar disorder   Clinical Impression   Pt reports independence at baseline with ADLs and uses cane for functional mobility, lives in [place] and has family assist PRN. Pt needing mod I - supervision at this time for ADLs, mod I for bed mobility and supervision for transfers with [facility]. Pt with mild STM deficits, but suspect close to baseline. Pt presenting with impairments listed below, will follow acutely. Anticipate no OT follow up needs at d/c.      Recommendations for follow up therapy are one component of a multi-disciplinary discharge planning process, led by the attending physician.  Recommendations may be updated based on patient status, additional functional criteria and insurance authorization.   Follow Up Recommendations  No OT follow up     Assistance Recommended at Discharge Set up Supervision/Assistance  Patient can return home with the following Direct supervision/assist for medications management;Direct supervision/assist for financial management;Assist for transportation;Help with stairs or ramp for entrance;Assistance with cooking/housework    Functional Status Assessment  Patient has had a recent decline in their functional status and demonstrates the ability to make significant improvements in function in a reasonable and predictable amount of time.  Equipment Recommendations  None recommended by OT (pt has all needed DME)    Recommendations for Other Services       Precautions / Restrictions Precautions Precautions:  Fall Restrictions Weight Bearing Restrictions: No      Mobility Bed Mobility Overal bed mobility: Modified Independent                  Transfers Overall transfer level: Needs assistance Equipment used: Straight cane Transfers: Sit to/from Stand Sit to Stand: Supervision                  Balance Overall balance assessment: Mild deficits observed, not formally tested                                         ADL either performed or assessed with clinical judgement   ADL Overall ADL's : Needs assistance/impaired Eating/Feeding: Modified independent   Grooming: Standing;Supervision/safety   Upper Body Bathing: Supervision/ safety;Sitting   Lower Body Bathing: Supervison/ safety   Upper Body Dressing : Supervision/safety   Lower Body Dressing: Supervision/safety   Toilet Transfer: Supervision/safety   Toileting- Clothing Manipulation and Hygiene: Supervision/safety       Functional mobility during ADLs: Supervision/safety;Cane       Vision Baseline Vision/History: 1 Wears glasses Vision Assessment?: No apparent visual deficits Additional Comments: intermittent double vision at baseline due to cholesterol medication per pt report. Able to accurately read paragraph with glasses on, functional for BADL     Perception Perception Perception Tested?: No   Praxis Praxis Praxis tested?: Not tested    Pertinent Vitals/Pain Pain Assessment Pain Assessment: No/denies pain     Hand Dominance Right   Extremity/Trunk Assessment Upper Extremity Assessment Upper Extremity Assessment: Generalized weakness   Lower Extremity Assessment Lower Extremity Assessment: Defer to PT evaluation   Cervical /  Trunk Assessment Cervical / Trunk Assessment: Normal   Communication Communication Communication: No difficulties   Cognition Arousal/Alertness: Awake/alert Behavior During Therapy: WFL for tasks assessed/performed Overall Cognitive  Status: Impaired/Different from baseline Area of Impairment: Memory                     Memory: Decreased short-term memory         General Comments: mild STM deficits, but suspect baseline     General Comments  VSS on RA    Exercises     Shoulder Instructions      Home Living Family/patient expects to be discharged to:: Private residence Living Arrangements: Alone Available Help at Discharge: Family;Available PRN/intermittently Type of Home: Independent living facility [facility] place) Home Access: Elevator     Home Layout: Multi-level;Other (Comment) (lives on 3rd floor)     Bathroom Shower/Tub: Walk-in Psychologist, prison and probation services: Standard     Home Equipment: Cane - single point;Rollator (4 wheels);Wheelchair - manual;Grab bars - tub/shower;Grab bars - toilet;Shower seat          Prior Functioning/Environment Prior Level of Function : Independent/Modified Independent             Mobility Comments: uses cane or rollator ADLs Comments: ind with IADLs, states he makes microwave meals        OT Problem List: Decreased strength;Decreased range of motion;Decreased activity tolerance;Impaired balance (sitting and/or standing);Decreased cognition      OT Treatment/Interventions: Self-care/ADL training;Therapeutic exercise;Energy conservation;DME and/or AE instruction;Therapeutic activities;Patient/family education;Balance training    OT Goals(Current goals can be found in the care plan section) Acute Rehab OT Goals Patient Stated Goal: none stated OT Goal Formulation: With patient Time For Goal Achievement: 03/29/22 Potential to Achieve Goals: Good ADL Goals Pt Will Perform Tub/Shower Transfer: Shower transfer;Independently;ambulating Additional ADL Goal #1: pt will receive passing score on pillbox assessment in prep for IADLs  OT Frequency: Min 2X/week    Co-evaluation              AM-PAC OT "6 Clicks" Daily Activity     Outcome Measure  Help from another person eating meals?: None Help from another person taking care of personal grooming?: None Help from another person toileting, which includes using toliet, bedpan, or urinal?: None Help from another person bathing (including washing, rinsing, drying)?: A Little Help from another person to put on and taking off regular upper body clothing?: None Help from another person to put on and taking off regular lower body clothing?: None 6 Click Score: 23   End of Session Equipment Utilized During Treatment: Gait belt;Other (comment) (cane) Nurse Communication: Mobility status  Activity Tolerance: Patient tolerated treatment well Patient left: in chair;with call bell/phone within reach;with chair alarm set  OT Visit Diagnosis: Unsteadiness on feet (R26.81);Other abnormalities of gait and mobility (R26.89);Muscle weakness (generalized) (M62.81)                Time: [id number] OT Time Calculation (min): 28 min Charges:  OT General Charges $OT Visit: 1 Visit OT Evaluation $OT Eval Moderate Complexity: 1 Mod OT Treatments $Self Care/Home Management : 8-22 mins  Renaye Rakers, OTD, OTR/L SecureChat Preferred Acute Rehab [phone]    Renaye Rakers [place] 03/15/2022, 12:20 PM

## 2022-03-15 NOTE — Plan of Care (Signed)
  Problem: Education: Goal: Knowledge of General Education information will improve Description: Including pain rating scale, medication(s)/side effects and non-pharmacologic comfort measures Outcome: Progressing   Problem: Health Behavior/Discharge Planning: Goal: Ability to manage health-related needs will improve Outcome: Progressing   Problem: Clinical Measurements: Goal: Ability to maintain clinical measurements within normal limits will improve Outcome: Progressing Goal: Will remain free from infection Outcome: Progressing Goal: Diagnostic test results will improve Outcome: Progressing Goal: Cardiovascular complication will be avoided Outcome: Progressing   Problem: Activity: Goal: Risk for activity intolerance will decrease Outcome: Progressing   Problem: Nutrition: Goal: Adequate nutrition will be maintained Outcome: Progressing   Problem: Elimination: Goal: Will not experience complications related to bowel motility Outcome: Progressing   Problem: Pain Managment: Goal: General experience of comfort will improve Outcome: Progressing   Problem: Safety: Goal: Ability to remain free from injury will improve Outcome: Progressing   Problem: Skin Integrity: Goal: Risk for impaired skin integrity will decrease Outcome: Progressing   Problem: Education: Goal: Knowledge of disease or condition will improve Outcome: Progressing Goal: Knowledge of secondary prevention will improve (MUST DOCUMENT ALL) Outcome: Progressing Goal: Knowledge of patient specific risk factors will improve Elta [place] N/A or DELETE if not current risk factor) Outcome: Progressing   Problem: Coping: Goal: Will identify appropriate support needs Outcome: Progressing   Problem: Health Behavior/Discharge Planning: Goal: Ability to manage health-related needs will improve Outcome: Progressing Goal: Goals will be collaboratively established with patient/family Outcome: Progressing   Problem:  Self-Care: Goal: Verbalization of feelings and concerns over difficulty with self-care will improve Outcome: Progressing   Problem: Nutrition: Goal: Dietary intake will improve Outcome: Progressing   Problem: Coping: Goal: Level of anxiety will decrease Outcome: Completed/Met   Problem: Elimination: Goal: Will not experience complications related to urinary retention Outcome: Completed/Met   Problem: Ischemic Stroke/TIA Tissue Perfusion: Goal: Complications of ischemic stroke/TIA will be minimized Outcome: Completed/Met   Problem: Coping: Goal: Will verbalize positive feelings about self Outcome: Completed/Met   Problem: Self-Care: Goal: Ability to participate in self-care as condition permits will improve Outcome: Completed/Met Goal: Ability to communicate needs accurately will improve Outcome: Completed/Met   Problem: Nutrition: Goal: Risk of aspiration will decrease Outcome: Completed/Met   Problem: Clinical Measurements: Goal: Respiratory complications will improve Outcome: Not Applicable

## 2022-03-15 NOTE — Progress Notes (Signed)
  Transition of Care [facility]) Screening Note   Patient Details  Name: Aarian Flewellen Date of Birth: 13-Jul-1932   Transition of [facility]) CM/SW Contact:    Pollie Friar, RN Phone Number: 03/15/2022, 3:40 PM   Pt is from home alone. No f/u per PT/OT. Transition of Care Department [facility]) has reviewed patient. We will continue to monitor patient advancement through interdisciplinary progression rounds. If new patient transition needs arise, please place a TOC consult.

## 2022-03-15 NOTE — Evaluation (Signed)
Physical Therapy Evaluation and Discharge Patient Details Name: Victor Osborne MRN: [id number] DOB: 12-08-1932 Today's Date: 03/15/2022  History of Present Illness  Pt is an 89 y/o M presenting to ED on 2/26 with RLE weakness, admitted for TIA. CT head negative for acute intracranial abnormality, MRI pending. PMH includes sick sinus syndrome, complete heart block s/p pacemaker, COPD, OSA, HTN, DM2, Parkinson's Disease, CVA, hypothyroidism, PTSD, bipolar disorder  Clinical Impression  Patient evaluated by Physical Therapy with no further acute PT needs identified. Pt displays no focal strength deficits and appears to be close to his functional baseline. Pt ambulating 250 ft with a cane and negotiated steps without physical assist. Reports resolution of symptoms. Education provided regarding BEFAST stroke symptoms. All education has been completed and the patient has no further questions. No follow-up Physical Therapy or equipment needs. PT is signing off. Thank you for this referral.      Recommendations for follow up therapy are one component of a multi-disciplinary discharge planning process, led by the attending physician.  Recommendations may be updated based on patient status, additional functional criteria and insurance authorization.  Follow Up Recommendations No PT follow up      Assistance Recommended at Discharge Intermittent Supervision/Assistance  Patient can return home with the following  Direct supervision/assist for medications management    Equipment Recommendations None recommended by PT  Recommendations for Other Services       Functional Status Assessment Patient has not had a recent decline in their functional status     Precautions / Restrictions Precautions Precautions: Fall Restrictions Weight Bearing Restrictions: No      Mobility  Bed Mobility Overal bed mobility: Modified Independent                  Transfers Overall transfer level:  Modified independent Equipment used: Straight cane                    Ambulation/Gait Ambulation/Gait assistance: Modified independent (Device/Increase time) Gait Distance (Feet): 250 Feet Assistive device: Straight cane Gait Pattern/deviations: WFL(Within Functional Limits)   Gait velocity interpretation: >2.62 ft/sec, indicative of community ambulatory      Stairs Stairs: Yes Stairs assistance: Supervision Stair Management: One rail Left, With cane Number of Stairs: 2    Wheelchair Mobility    Modified Rankin (Stroke Patients Only)       Balance Overall balance assessment: Mild deficits observed, not formally tested                                           Pertinent Vitals/Pain Pain Assessment Pain Assessment: No/denies pain    Home Living Family/patient expects to be discharged to:: Private residence Living Arrangements: Alone Available Help at Discharge: Family;Available PRN/intermittently Type of Home: Independent living facility [facility] place) Home Access: Elevator       Home Layout: Multi-level;Other (Comment) (lives on 3rd floor) Home Equipment: Cane - single point;Rollator (4 wheels);Wheelchair - manual;Grab bars - tub/shower;Grab bars - toilet;Shower seat      Prior Function Prior Level of Function : Independent/Modified Independent             Mobility Comments: uses cane or rollator ADLs Comments: ind with IADLs, states he makes microwave meals     Hand Dominance   Dominant Hand: Right    Extremity/Trunk Assessment   Upper Extremity Assessment Upper Extremity Assessment: Overall [facility]  for tasks assessed    Lower Extremity Assessment Lower Extremity Assessment: RLE deficits/detail;LLE deficits/detail RLE Deficits / Details: Strength 5/5 LLE Deficits / Details: Strength 5/5    Cervical / Trunk Assessment Cervical / Trunk Assessment: Normal  Communication   Communication: No difficulties  Cognition  Arousal/Alertness: Awake/alert Behavior During Therapy: WFL for tasks assessed/performed Overall Cognitive Status: Impaired/Different from baseline Area of Impairment: Memory                     Memory: Decreased short-term memory         General Comments: mild STM deficits, but suspect baseline        General Comments General comments (skin integrity, edema, etc.): VSS on RA    Exercises     Assessment/Plan    PT Assessment Patient does not need any further PT services  PT Problem List         PT Treatment Interventions      PT Goals (Current goals can be found in the Care Plan section)  Acute Rehab PT Goals Patient Stated Goal: maintain independence PT Goal Formulation: All assessment and education complete, DC therapy    Frequency       Co-evaluation               AM-PAC PT "6 Clicks" Mobility  Outcome Measure Help needed turning from your back to your side while in a flat bed without using bedrails?: None Help needed moving from lying on your back to sitting on the side of a flat bed without using bedrails?: None Help needed moving to and from a bed to a chair (including a wheelchair)?: None Help needed standing up from a chair using your arms (e.g., wheelchair or bedside chair)?: None Help needed to walk in hospital room?: None Help needed climbing 3-5 steps with a railing? : A Little 6 Click Score: 23    End of Session Equipment Utilized During Treatment: Gait belt Activity Tolerance: Patient tolerated treatment well Patient left: in bed;with call bell/phone within reach Nurse Communication: Mobility status PT Visit Diagnosis: Unsteadiness on feet (R26.81)    Time: 1134-1150 PT Time Calculation (min) (ACUTE ONLY): 16 min   Charges:   PT Evaluation $PT Eval Low Complexity: 1 Low          Wyona Almas, PT, DPT Acute Rehabilitation Services Office [phone]   Deno Etienne 03/15/2022, 1:02 PM

## 2022-03-15 NOTE — Plan of Care (Signed)
  Problem: Education: Goal: Knowledge of General Education information will improve Description: Including pain rating scale, medication(s)/side effects and non-pharmacologic comfort measures Outcome: Progressing   Problem: Health Behavior/Discharge Planning: Goal: Ability to manage health-related needs will improve Outcome: Progressing   Problem: Clinical Measurements: Goal: Ability to maintain clinical measurements within normal limits will improve Outcome: Progressing Goal: Will remain free from infection Outcome: Progressing Goal: Diagnostic test results will improve Outcome: Progressing Goal: Cardiovascular complication will be avoided Outcome: Progressing   Problem: Activity: Goal: Risk for activity intolerance will decrease Outcome: Progressing   Problem: Nutrition: Goal: Adequate nutrition will be maintained Outcome: Progressing   Problem: Elimination: Goal: Will not experience complications related to bowel motility Outcome: Progressing   Problem: Pain Managment: Goal: General experience of comfort will improve Outcome: Progressing   Problem: Safety: Goal: Ability to remain free from injury will improve Outcome: Progressing   Problem: Skin Integrity: Goal: Risk for impaired skin integrity will decrease Outcome: Progressing   Problem: Education: Goal: Knowledge of disease or condition will improve Outcome: Progressing Goal: Knowledge of secondary prevention will improve (MUST DOCUMENT ALL) Outcome: Progressing Goal: Knowledge of patient specific risk factors will improve [place] N/A or DELETE if not current risk factor) Outcome: Progressing   Problem: Coping: Goal: Will identify appropriate support needs Outcome: Progressing   Problem: Health Behavior/Discharge Planning: Goal: Ability to manage health-related needs will improve Outcome: Progressing Goal: Goals will be collaboratively established with patient/family Outcome: Progressing   Problem:  Self-Care: Goal: Verbalization of feelings and concerns over difficulty with self-care will improve Outcome: Progressing   Problem: Nutrition: Goal: Dietary intake will improve Outcome: Progressing

## 2022-03-15 NOTE — H&P (Addendum)
History and Physical    Patient: Victor Osborne [id number] DOB: 1932/07/01 DOA: 03/14/2022 DOS: the patient was seen and examined on 03/15/2022 PCP: [name], [place]  Patient coming from:  via private vechicle  Chief Complaint:  Chief Complaint  Patient presents with   Extremity Weakness   HPI: Victor Osborne is a 89 y.o. male with medical history significant of hypertension, first-degree heart block, TIA, hypothyroidism, COPD, bipolar disorder, and BPH who presents with complaints of intermittent right leg weakness.  Patient lives in independent living at a senior living facility and ambulates with use of a rollator.  Symptoms started yesterday morning around 10 AM as he was coming back from breakfast to his apartment.  He had gotten on the elevator and then turning around states that his right leg began to feel weak causing him to lose his balance.  He bumped into the t  wall of the elevator and was he was able to regain balance and strength seem to be improved within a matter of seconds.  Second episode happened a little while later again on the elevator, but he reported history of did not improve as rapidly this time in his leg beginning to feel numb with worsening weakness as he got off the elevator.  Denies having loss of consciousness or dizziness feelings..  He had similar symptoms happen like this back in March 2023, but with the left leg at that time.  His pacemaker was documented to be MRI incompatible during that hospitalization.  In the emergency department patient was noted to be afebrile with blood pressures elevated up to 192/70.  CT imaging of the brain did not note any acute abnormality and chronic small vessel disease.  Labs since 2/26 noted hemoglobin 13.7->12.5, BUN 28, creatinine 1.16,   calcium 8.7, sed rate 5, and Tegretol level 5.  CTA of the head and neck did not note any large vessel occlusion.  Patient was evaluated by teleneurology and admission  was recommended for further workup.  Review of Systems: As mentioned in the history of present illness. All other systems reviewed and are negative. Past Medical History:  Diagnosis Date   Bipolar 1 disorder ([place])    COPD (chronic obstructive pulmonary disease) ([facility])    Depression    First degree AV block    Hypertension    pt denies but was in medical record   Hypoglycemia, unspecified    Myalgia and myositis, unspecified    Obesity    Pneumonia 1970   Skin cancer (melanoma) ([place])    Thyroid disease    Unspecified hypothyroidism    UPJ obstruction, congenital    Past Surgical History:  Procedure Laterality Date   APPENDECTOMY  1939   malignant melanoma removed from right forehead  2008   PERMANENT PACEMAKER INSERTION N/A 06/19/2013   Procedure: PERMANENT PACEMAKER INSERTION;  Surgeon: Evans Lance, MD;  Location: [facility] CATH LAB;  Service: Cardiovascular;  Laterality: N/A;   right big toe  surgery  2009   TRANSURETHRAL RESECTION OF PROSTATE  02/04/2011   Procedure: TRANSURETHRAL RESECTION OF THE PROSTATE (TURP);  Surgeon: Dutch Gray, MD;  Location: [facility] ORS;  Service: Urology;  Laterality: N/A;   URETHRA SURGERY  2010   reconstruction   Social History:  reports that he has quit smoking. His smoking use included cigarettes. He has a 80.00 pack-year smoking history. He has never used smokeless tobacco. He reports that he does not drink alcohol and does not use drugs.  No Known Allergies  Family History  Problem Relation Age of Onset   Cerebral aneurysm Mother    Lung cancer Father    Drug abuse Daughter     Prior to Admission medications   Medication Sig Start Date End Date Taking? Authorizing Provider  acetaminophen (TYLENOL) 325 MG tablet Take 650 mg by mouth every 6 (six) hours as needed.    [provider]  alclomethasone (ACLOVATE) 0.05 % ointment Apply 1 application topically 2 (two) times daily as needed (for rash).    [provider]  aspirin EC 325 MG  tablet Take 1 tablet (325 mg total) by mouth daily. 02/28/22   [name], FNP  carbamazepine (TEGRETOL) 200 MG tablet Take 1 tablet (200 mg total) by mouth 3 (three) times daily. 09/16/21   Thayer Headings, PMHNP  carbidopa-levodopa (SINEMET IR) 25-100 MG tablet Take 2 tablets by mouth 3 (three) times daily. 10/11/21   Tat, Eustace Quail, DO  cholecalciferol (VITAMIN D) 1000 UNITS tablet Take 2,000 Units by mouth daily.    [provider]  Coenzyme Q10 (CO Q 10 PO) Take 1 capsule by mouth daily.    [provider]  desoximetasone (TOPICORT) 0.25 % cream Apply 1 application topically 2 (two) times daily.    [provider]  ferrous sulfate 324 (65 Fe) MG TBEC Take 324 mg by mouth.    [provider]  Flaxseed, Linseed, (FLAX SEED OIL) 1300 MG CAPS Take 1 capsule by mouth daily.    [provider]  FLUoxetine (PROZAC) 10 MG capsule TAKE 1 CAPSULE EVERY DAY 01/25/22   Thayer Headings, PMHNP  levothyroxine (LEVOTHROID) 125 MCG tablet Take 1 tablet (125 mcg total) by mouth daily before breakfast. 11/04/21   [name], FNP  MAGNESIUM PO Take 1 capsule by mouth daily.    [provider]  Multiple Vitamin (MULITIVITAMIN WITH MINERALS) TABS Take 1 tablet by mouth daily.    [provider]  Omega-3 Fatty Acids (OMEGA-3 CF PO) Take 1,200 mg by mouth daily.    [provider]  POTASSIUM GLUCONATE PO Take 1 capsule by mouth daily.    [provider]  pravastatin (PRAVACHOL) 80 MG tablet Take 1 tablet (80 mg total) by mouth daily. 01/26/22   [name], FNP  QUEtiapine (SEROQUEL XR) 50 MG TB24 24 hr tablet Take 1 tablet (50 mg total) by mouth at bedtime. 09/16/21   Thayer Headings, PMHNP  tamsulosin (FLOMAX) 0.4 MG CAPS capsule Take 0.4 mg by mouth at bedtime. 02/15/21   [provider]  TURMERIC CURCUMIN PO Take 1 capsule by mouth daily.    [provider]    Physical Exam: Vitals:    03/14/22 2300 03/15/22 0105 03/15/22 0109 03/15/22 0321  BP: (!) 160/74 (!) 136/90  (!) 137/58  Pulse: 62 60  60  Resp: '16 16  16  '$ Temp:  97.7 F (36.5 C)  97.8 F (36.6 C)  TempSrc:  Oral  Oral  SpO2: 99% 98%  97%  Weight:   88.8 kg   Height:   '6\' 4"'$  (1.93 m)    Constitutional: Elderly male currently in no acute distress. Eyes: PERRL, lids and conjunctivae normal ENMT: Mucous membranes are moist.   Neck: normal, supple  Respiratory: clear to auscultation bilaterally, no wheezing, no crackles. Normal respiratory effort. No accessory muscle use.  Cardiovascular: Regular rate and rhythm, no murmurs / rubs / gallops. No extremity edema. 2+ pedal pulses. No carotid bruits.  Abdomen: no tenderness, no masses palpated. No hepatosplenomegaly. Bowel sounds positive.  Musculoskeletal: no clubbing / cyanosis. No joint deformity upper and lower extremities. Good ROM, no contractures. Normal muscle tone.  Skin: no rashes, lesions, ulcers. No induration Neurologic: CN 2-12 grossly intact.  Strength 5/5 in all 4.  Psychiatric: Normal judgment and insight. Alert and oriented x 3. Normal mood.   Data Reviewed:  EKG reveals a junctional rhythm at 60 bpm with right axis deviation.  Assessment and Plan: Suspected TIA Patient presents with episodes of right leg weakness that started yesterday morning and have since resolved.  CT of the head did not note any acute abnormality and CTA of the head and neck did not show any concern for large vessel occlusion. Unable to have MRI due to it being incompatible with his pacemaker.    -Admit to a telemetry bed -Stroke order set utilized -Neurochecks -Follow-up echocardiogram -PT/OT to eval and treat -Continue aspirin -Neurology recommending repeat CT scan of the contrast tonight.  Heart block s/p pacemaker Patient with prior history of chronotropic incompetence with first-degree AV block with placement of Chiropractor.  Pacemaker was  recently interrogated on 01/2022 and had been noted to be functioning properly  Parkinson's disease -Continue Sinemet   COPD, without acute exacerbation No significant wheezing noted on physical exam. -Albuterol nebs as per shortness of breath/wheezing  Bipolar disorder -Continue current medication regimen  Hyperlipidemia LDL 72. -Continue pravastatin  Hypothyroidism -Check TSH  DVT prophylaxis: Lovenox Advance Care Planning:   Code Status: Full Code    Consults: Neurology  Family Communication: Patient stated that he was keeping his family updated.  Severity of Illness: The appropriate patient status for this patient is INPATIENT. Inpatient status is judged to be reasonable and necessary in order to provide the required intensity of service to ensure the patient's safety. The patient's presenting symptoms, physical exam findings, and initial radiographic and laboratory data in the context of their chronic comorbidities is felt to place them at high risk for further clinical deterioration. Furthermore, it is not anticipated that the patient will be medically stable for discharge from the hospital within 2 midnights of admission.   * I certify that at the point of admission it is my clinical judgment that the patient will require inpatient hospital care spanning beyond 2 midnights from the point of admission due to high intensity of service, high risk for further deterioration and high frequency of surveillance required.*  Author: Norval Morton, MD 03/15/2022 8:34 AM  For on call review [email or web address].

## 2022-03-16 DIAGNOSIS — E785 Hyperlipidemia, unspecified: Secondary | ICD-10-CM | POA: Diagnosis not present

## 2022-03-16 DIAGNOSIS — G459 Transient cerebral ischemic attack, unspecified: Secondary | ICD-10-CM | POA: Diagnosis not present

## 2022-03-16 LAB — HEMOGLOBIN A1C
Hgb A1c MFr Bld: 5.9 % — ABNORMAL HIGH (ref 4.8–5.6)
Mean Plasma Glucose: 123 mg/dL

## 2022-03-16 MED ORDER — CLOPIDOGREL BISULFATE 75 MG PO TABS: 75.0000 mg | ORAL_TABLET | Freq: Every day | ORAL | 3 refills | Status: DC

## 2022-03-16 MED ORDER — ASPIRIN 81 MG PO TBEC: 81.0000 mg | DELAYED_RELEASE_TABLET | Freq: Every day | ORAL | 0 refills | Status: AC

## 2022-03-16 NOTE — Discharge Summary (Signed)
[facility]  Physician Discharge Summary   Patient ID: Victor Osborne MRN: [id number] DOB/AGE: [birth year] 89 y.o.  Admit date: 03/14/2022 Discharge date:   03/17/2022   PCP: Victor Salvage, FNP  DISCHARGE DIAGNOSES:    TIA (transient ischemic attack)   Pacemaker   Parkinson disease   COPD mixed type ([place])   Bipolar 1 disorder ([place])   Hyperlipidemia   Hypothyroidism   Chronic post-traumatic stress disorder   HTN (hypertension)   RECOMMENDATIONS FOR OUTPATIENT FOLLOW UP: Outpatient follow-up with PCP Follow-up with Victor Osborne with neurology in 8 weeks   Home Health: None Equipment/Devices: None  CODE STATUS: Full code  DISCHARGE CONDITION: fair  Diet recommendation: As before  INITIAL HISTORY:  89 y.o. male with medical history significant of hypertension, first-degree heart block, TIA, hypothyroidism, COPD, bipolar disorder, and BPH who presented with complaints of intermittent right leg weakness.  Patient lives in independent living at a senior living facility and ambulates with use of a rollator.  Patient has a pacemaker so cannot undergo MRI.  Neurology has been consulted.     Consultants: Neurology   Procedures: Transthoracic echocardiogram   HOSPITAL COURSE:   TIA Could not undergo MRI due to presence of pacemaker.  CT head was repeated and does not show any new findings.  His neurological deficits have resolved. LDL is 72.  Noted to be on pravastatin.   HbA1c 5.9.  TSH is normal at 4.1.  Echocardiogram shows normal systolic function. Seen by PT and OT.  No needs identified. Neurology recommends aspirin and Plavix for 3 weeks followed by Plavix alone.   History of heart block status post pacemaker Stable.   Parkinson's disease Continue Sinemet.   History of COPD Stable.   Bipolar disorder Stable.  Continue home medications.   Hyperlipidemia LDL is 72.  Continue with statin.   Hypothyroidism Continue medications.  TSH  normal.   Patient is stable.  Okay for discharge home today.   PERTINENT LABS:  The results of significant diagnostics from this hospitalization (including imaging, microbiology, ancillary and laboratory) are listed below for reference.     Labs:   Basic Metabolic Panel: Recent Labs  Lab 03/14/22 2040 03/15/22 0311  NA 138  --   K 4.0  --   CL 107  --   CO2 24  --   GLUCOSE 110*  --   BUN 28*  --   CREATININE 1.16 0.99  CALCIUM 8.7*  --    Liver Function Tests: Recent Labs  Lab 03/14/22 2040  AST 20  ALT <5  ALKPHOS 81  BILITOT 0.7  PROT 6.8  ALBUMIN 3.9    CBC: Recent Labs  Lab 03/14/22 2040 03/15/22 0312  WBC 6.7 5.6  NEUTROABS 5.1  --   HGB 13.7 12.5*  HCT 41.8 37.2*  MCV 97.9 98.2  PLT 176 160     CBG: Recent Labs  Lab 03/14/22 2039  GLUCAP 108*     IMAGING STUDIES CT HEAD WO CONTRAST (5MM)  Result Date: 03/16/2022 CLINICAL DATA:  Follow-up examination for stroke. EXAM: CT HEAD WITHOUT CONTRAST TECHNIQUE: Contiguous axial images were obtained from the base of the skull through the vertex without intravenous contrast. RADIATION DOSE REDUCTION: This exam was performed according to the departmental dose-optimization program which includes automated exposure control, adjustment of the mA and/or kV according to patient size and/or use of iterative reconstruction technique. COMPARISON:  Prior studies from 03/14/2022. FINDINGS: Brain: Mild age-related cerebral atrophy with chronic small  vessel ischemic disease, stable. No acute intracranial hemorrhage. No acute large vessel territory infarct. No mass lesion or midline shift. No hydrocephalus or extra-axial fluid collection. Vascular: No abnormal hyperdense vessel. Skull: Scalp soft tissues and calvarium demonstrate no new finding. Sinuses/Orbits: Globes orbital soft tissues within normal limits. Mild scattered mucosal thickening noted about the ethmoidal air cells. Paranasal sinuses are otherwise clear. No  mastoid effusion. Other: None. IMPRESSION: Stable head CT. No acute intracranial abnormality. Electronically Signed   By: Jeannine Boga M.D.   On: 03/16/2022 02:14   ECHOCARDIOGRAM COMPLETE  Result Date: 03/15/2022    ECHOCARDIOGRAM REPORT   Patient Name:   ARYAV Osborne [facility] Date of Exam: 03/15/2022 Medical Rec #:  [id number]           Height:       76.0 in Accession #:    [id number]          Weight:       195.8 lb Date of Birth:  [birth year]          BSA:          2.195 m Patient Age:    89 years            BP:           137/58 mmHg Patient Gender: M                   HR:           60 bpm. Exam Location:  Inpatient Procedure: 2D Echo, Cardiac Doppler and Color Doppler Indications:    TIA G45.9  History:        Patient has prior history of Echocardiogram examinations, most                 recent 03/31/2021. Pacemaker, TIA and COPD; Risk                 Factors:Hypertension, Sleep Apnea and Diabetes. Complete Heart                 Block.  Sonographer:    Ronny Flurry Referring Phys: [phone] DEBBY CROSLEY  Sonographer Comments: Technically difficult study due to poor echo windows, no parasternal window and no apical window. Image acquisition challenging due to patient body habitus. IMPRESSIONS  1. Left ventricular ejection fraction, by estimation, is 55 to 60%. The left ventricle has normal function. Left ventricular endocardial border not optimally defined to evaluate regional wall motion. Left ventricular diastolic function could not be evaluated.  2. Right ventricular systolic function is normal. The right ventricular size is normal. Tricuspid regurgitation signal is inadequate for assessing PA pressure.  3. The mitral valve is grossly normal. No evidence of mitral valve regurgitation.  4. The aortic valve is tricuspid. Aortic valve regurgitation is not visualized. No aortic stenosis is present.  5. The inferior vena cava is dilated in size with >50% respiratory variability, suggesting right atrial  pressure of 8 mmHg. FINDINGS  Left Ventricle: Left ventricular ejection fraction, by estimation, is 55 to 60%. The left ventricle has normal function. Left ventricular endocardial border not optimally defined to evaluate regional wall motion. The left ventricular internal cavity size was normal in size. There is no left ventricular hypertrophy. Left ventricular diastolic function could not be evaluated. Right Ventricle: The right ventricular size is normal. No increase in right ventricular wall thickness. Right ventricular systolic function is normal. Tricuspid regurgitation signal is inadequate for assessing PA pressure. Left Atrium: Left atrial size  was normal in size. Right Atrium: Right atrial size was normal in size. Pericardium: There is no evidence of pericardial effusion. Mitral Valve: The mitral valve is grossly normal. No evidence of mitral valve regurgitation. Tricuspid Valve: The tricuspid valve is normal in structure. Tricuspid valve regurgitation is not demonstrated. Aortic Valve: The aortic valve is tricuspid. Aortic valve regurgitation is not visualized. No aortic stenosis is present. Pulmonic Valve: The pulmonic valve was normal in structure. Pulmonic valve regurgitation is not visualized. Aorta: The aortic root is normal in size and structure. Venous: The inferior vena cava is dilated in size with greater than 50% respiratory variability, suggesting right atrial pressure of 8 mmHg. IAS/Shunts: No atrial level shunt detected by color flow Doppler. Additional Comments: A device lead is visualized in the right ventricle.  IVC IVC diam: 2.30 cm AORTIC VALVE LVOT Vmax:   74.70 cm/s LVOT Vmean:  47.050 cm/s LVOT VTI:    0.156 m  SHUNTS Systemic VTI: 0.16 m Dani Gobble Croitoru MD Electronically signed by Sanda Klein MD Signature Date/Time: 03/15/2022/1:29:04 PM    Final    CT ANGIO HEAD NECK W WO CM  Result Date: 03/14/2022 CLINICAL DATA:  Initial evaluation for TIA. EXAM: CT ANGIOGRAPHY HEAD AND NECK  TECHNIQUE: Multidetector CT imaging of the head and neck was performed using the standard protocol during bolus administration of intravenous contrast. Multiplanar CT image reconstructions and MIPs were obtained to evaluate the vascular anatomy. Carotid stenosis measurements (when applicable) are obtained utilizing NASCET criteria, using the distal internal carotid diameter as the denominator. RADIATION DOSE REDUCTION: This exam was performed according to the departmental dose-optimization program which includes automated exposure control, adjustment of the mA and/or kV according to patient size and/or use of iterative reconstruction technique. CONTRAST:  108m OMNIPAQUE IOHEXOL 350 MG/ML SOLN COMPARISON:  Prior studies from earlier the same day and 03/31/2021. FINDINGS: CTA NECK FINDINGS Aortic arch: Visualized aortic arch normal in caliber with standard 3 vessel morphology. Mild atheromatous change about the arch itself. No high-grade stenosis about the origin the great vessels. Right carotid system: Right common and internal carotid arteries are patent without dissection. Mild for age atheromatous change about the right carotid bulb without hemodynamically significant stenosis. Left carotid system: Left common and internal carotid arteries are patent without dissection. Mild for age atheromatous change about the left carotid bulb without hemodynamically significant stenosis. Vertebral arteries: Both vertebral arteries arise from the subclavian arteries. No proximal subclavian artery stenosis. Focal moderate stenoses noted about the bilateral V2 segments at the level of C4-5 due to extrinsic compression from advanced uncovertebral disease. Otherwise, vertebral arteries are widely patent without stenosis or dissection. Skeleton: No discrete or worrisome osseous lesions. Advanced multilevel cervical spondylosis. Other neck: No other acute soft tissue abnormality within the neck. Upper chest: Emphysema with  superimposed scattered mild subsegmental atelectatic changes. Left-sided pacemaker/AICD in place. Review of the MIP images confirms the above findings CTA HEAD FINDINGS Anterior circulation: Mild atheromatous change within the right carotid siphon without hemodynamically significant stenosis. A1 segments, anterior communicating complex common anterior cerebral arteries widely patent proximally without stenosis. No M1 stenosis or occlusion. No proximal MCA branch occlusion or high-grade stenosis. Distal MCA branches perfused and symmetric. Posterior circulation: Both V4 segments patent without stenosis. Both PICA patent at their origins. Basilar widely patent without stenosis. Superior cerebellar and posterior cerebral arteries widely patent bilaterally. Venous sinuses: Patent allowing for timing the contrast bolus. Anatomic variants: None significant.  No aneurysm. Review of the MIP images confirms the  above findings IMPRESSION: 1. Negative CTA for large vessel occlusion or other emergent finding. 2. Mild for age atheromatous change about the carotid bifurcations and carotid siphons without hemodynamically significant stenosis. Aortic Atherosclerosis (ICD10-I70.0) and Emphysema (ICD10-J43.9). Electronically Signed   By: Jeannine Boga M.D.   On: 03/14/2022 22:54   CT HEAD WO CONTRAST  Result Date: 03/14/2022 CLINICAL DATA:  Extremity weakness.  TIA EXAM: CT HEAD WITHOUT CONTRAST TECHNIQUE: Contiguous axial images were obtained from the base of the skull through the vertex without intravenous contrast. RADIATION DOSE REDUCTION: This exam was performed according to the departmental dose-optimization program which includes automated exposure control, adjustment of the mA and/or kV according to patient size and/or use of iterative reconstruction technique. COMPARISON:  CT head 03/31/2021 FINDINGS: Brain: No intracranial hemorrhage, mass effect, or evidence of acute infarct. No hydrocephalus. No extra-axial  fluid collection. Generalized cerebral atrophy. Ill-defined hypoattenuation within the cerebral white matter is nonspecific but consistent with chronic small vessel ischemic disease. Vascular: No hyperdense vessel. Intracranial arterial calcification. Skull: No fracture or focal lesion. Sinuses/Orbits: No acute finding. Paranasal sinuses and mastoid air cells are well aerated. Other: None. IMPRESSION: 1. No evidence of acute intracranial abnormality. 2. Chronic small vessel ischemic disease and cerebral atrophy. Electronically Signed   By: Placido Sou M.D.   On: 03/14/2022 21:07    DISCHARGE EXAMINATION: Vitals:   03/16/22 2039 03/16/22 2352 03/17/22 0352 03/17/22 0802  BP: 133/66 (!) 106/58 (!) 116/55 138/68  Pulse: (!) 59 61 (!) 59 60  Resp:    18  Temp: 98.1 F (36.7 C) (!) 97.4 F (36.3 C) 97.9 F (36.6 C) (!) 97.4 F (36.3 C)  TempSrc: Oral Oral Oral Oral  SpO2: 98% 97% 100% 94%  Weight:      Height:       General appearance: Awake alert.  In no distress Resp: Clear to auscultation bilaterally.  Normal effort Cardio: S1-S2 is normal regular.  No S3-S4.  No rubs murmurs or bruit GI: Abdomen is soft.  Nontender nondistended.  Bowel sounds are present normal.  No masses organomegaly   DISPOSITION: Home  Discharge Instructions     Call MD for:  difficulty breathing, headache or visual disturbances   Complete by: As directed    Call MD for:  extreme fatigue   Complete by: As directed    Call MD for:  persistant dizziness or light-headedness   Complete by: As directed    Call MD for:  persistant nausea and vomiting   Complete by: As directed    Call MD for:  severe uncontrolled pain   Complete by: As directed    Call MD for:  temperature >100.4   Complete by: As directed    Diet - low sodium heart healthy   Complete by: As directed    Discharge instructions   Complete by: As directed    Please take medications as prescribed.  Please be sure to follow-up with Victor Osborne in  8 weeks and with your send primary care provider in 1 week.  You were cared for by a hospitalist during your hospital stay. If you have any questions about your discharge medications or the care you received while you were in the hospital after you are discharged, you can call the unit and asked to speak with the hospitalist on call if the hospitalist that took care of you is not available. Once you are discharged, your primary care physician will handle any further medical issues. Please note  that NO REFILLS for any discharge medications will be authorized once you are discharged, as it is imperative that you return to your primary care physician (or establish a relationship with a primary care physician if you do not have one) for your aftercare needs so that they can reassess your need for medications and monitor your lab values. If you do not have a primary care physician, you can call [phone] for a physician referral.   Increase activity slowly   Complete by: As directed           Allergies as of 03/17/2022   No Known Allergies      Medication List     TAKE these medications    acetaminophen 325 MG tablet Commonly known as: TYLENOL Take 650 mg by mouth every 6 (six) hours as needed for mild pain or headache.   alclomethasone 0.05 % ointment Commonly known as: ACLOVATE Apply 1 application topically 2 (two) times daily as needed (for rash).   aspirin EC 81 MG tablet Take 1 tablet (81 mg total) by mouth daily for 21 days. Swallow whole. What changed:  medication strength how much to take additional instructions   carbamazepine 200 MG tablet Commonly known as: TEGRETOL Take 1 tablet (200 mg total) by mouth 3 (three) times daily.   carbidopa-levodopa 25-100 MG tablet Commonly known as: SINEMET IR Take 2 tablets by mouth 3 (three) times daily.   cholecalciferol 1000 units tablet Commonly known as: VITAMIN D Take 2,000 Units by mouth daily.   clopidogrel 75 MG  tablet Commonly known as: PLAVIX Take 1 tablet (75 mg total) by mouth daily.   CO Q 10 PO Take 1 capsule by mouth daily.   ferrous sulfate 324 (65 Fe) MG Tbec Take 324 mg by mouth daily.   Flax Seed Oil 1300 MG Caps Take 1 capsule by mouth daily.   FLUoxetine 10 MG capsule Commonly known as: PROZAC TAKE 1 CAPSULE EVERY DAY   levothyroxine 125 MCG tablet Commonly known as: Levothroid Take 1 tablet (125 mcg total) by mouth daily before breakfast.   MAGNESIUM PO Take 1 capsule by mouth daily.   multivitamin with minerals Tabs tablet Take 1 tablet by mouth daily.   OMEGA-3 CF PO Take 1,200 mg by mouth daily.   POTASSIUM GLUCONATE PO Take 1 capsule by mouth daily.   pravastatin 80 MG tablet Commonly known as: Pravachol Take 1 tablet (80 mg total) by mouth daily.   QUEtiapine 50 MG Tb24 24 hr tablet Commonly known as: SEROQUEL XR Take 1 tablet (50 mg total) by mouth at bedtime.   tamsulosin 0.4 MG Caps capsule Commonly known as: FLOMAX Take 0.4 mg by mouth at bedtime.          Follow-up Information     Victor Salvage, FNP Follow up in 1 week(s).   Specialty: Internal Medicine Why: post hospitalization follow up Contact information: [place] [address] [phone]         Tat, Eustace Quail, DO Follow up in 8 week(s).   Specialty: Neurology Why: post hospitalization follow up Contact information: [place] [address] [place]: 35 minutes  [place]  [facility] Pager on [email or web address]  03/17/2022, 10:19 AM

## 2022-03-16 NOTE — Consult Note (Addendum)
Neurology Consultation  Reason for Consult: intermittent right leg weakness Referring Physician: Dr. [place] Pink  CC: right leg weakness  History is obtained from:patient and medical record   HPI: Victor Osborne is a 89 y.o. male  hypertension, first-degree heart block, TIA, hypothyroidism, COPD, bipolar disorder, and BPH who presents with complaints of intermittent right leg weakness.  Patient lives in independent living at a senior living facility and ambulates with use of a rollator. Symptoms started yesterday morning around 10 AM as he was coming back from breakfast to his apartment. He had gotten on the elevator and then turning around states that his right leg began to feel weak causing him to lose his balance. He bumped into the t wall of the elevator and was he was able to regain balance and strength seem to be improved within a matter of seconds. Second episode happened a little while later again on the elevator, but he reported history of did not improve as rapidly this time in his leg beginning to feel numb with worsening weakness as he got off the elevator. Denies having loss of consciousness or dizziness feelings..  He states he was doing quite well overnight this morning when he walked he felt his right leg was little heavy and he was leaning to the right but did not fall He denies any prior history of strokes, TIA.  CT head on admission was unremarkable and CT angiogram of the brain and neck did not show large vessel stenosis or occlusion. Patient sees Dr. Carles Collet for Parkinson's which has been quite stable over the years. LKW: 2/26 IV thrombolysis given?: no, outside window EVT:  No LVO Premorbid modified Rankin scale (mRS):  2-Slight disability-UNABLE to perform all activities but does not need assistance   ROS: Full ROS was performed and is negative except as noted in the HPI.    Past Medical History:  Diagnosis Date   Bipolar 1 disorder (HCC)    COPD (chronic obstructive  pulmonary disease) (HCC)    Depression    First degree AV block    Hypertension    pt denies but was in medical record   Hypoglycemia, unspecified    Myalgia and myositis, unspecified    Obesity    Pneumonia 1970   Skin cancer (melanoma) ([place])    Thyroid disease    Unspecified hypothyroidism    UPJ obstruction, congenital      Family History  Problem Relation Age of Onset   Cerebral aneurysm Mother    Lung cancer Father    Drug abuse Daughter      Social History:   reports that he has quit smoking. His smoking use included cigarettes. He has a 80.00 pack-year smoking history. He has never used smokeless tobacco. He reports that he does not drink alcohol and does not use drugs.  Medications  Current Facility-Administered Medications:    acetaminophen (TYLENOL) tablet 650 mg, 650 mg, Oral, Q4H PRN **OR** acetaminophen (TYLENOL) 160 MG/5ML solution 650 mg, 650 mg, Per Tube, Q4H PRN **OR** acetaminophen (TYLENOL) suppository 650 mg, 650 mg, Rectal, Q4H PRN, Crosley, Debby, MD   aspirin EC tablet 325 mg, 325 mg, Oral, Daily, Crosley, Debby, MD, 325 mg at 03/16/22 0917   carbamazepine (TEGRETOL) tablet 200 mg, 200 mg, Oral, TID, Crosley, Debby, MD, 200 mg at 03/16/22 0918   carbidopa-levodopa (SINEMET IR) 25-100 MG per tablet immediate release 2 tablet, 2 tablet, Oral, TID, Claria Dice, Debby, MD, 2 tablet at 03/16/22 0918   clopidogrel (PLAVIX) tablet  75 mg, 75 mg, Oral, Daily, Derek Jack, MD, 75 mg at 03/16/22 0918   enoxaparin (LOVENOX) injection 40 mg, 40 mg, Subcutaneous, Q24H, Crosley, Debby, MD, 40 mg at 03/16/22 0917   FLUoxetine (PROZAC) capsule 10 mg, 10 mg, Oral, Daily, Tamala Julian, Rondell A, MD, 10 mg at 03/16/22 [id number]   hydrALAZINE (APRESOLINE) injection 5 mg, 5 mg, Intravenous, Q4H PRN, Claria Dice, Debby, MD   levothyroxine (SYNTHROID) tablet 125 mcg, 125 mcg, Oral, QAC breakfast, Crosley, Debby, MD, 125 mcg at 03/16/22 0531   pravastatin (PRAVACHOL) tablet 80 mg, 80 mg, Oral,  Daily, Crosley, Debby, MD, 80 mg at 03/16/22 [id number]   QUEtiapine (SEROQUEL XR) 24 hr tablet 50 mg, 50 mg, Oral, QHS, Smith, Rondell A, MD, 50 mg at 03/15/22 2158   senna-docusate (Senokot-S) tablet 1 tablet, 1 tablet, Oral, QHS PRN, Claria Dice, Debby, MD   tamsulosin (FLOMAX) capsule 0.4 mg, 0.4 mg, Oral, QHS, Smith, Rondell A, MD, 0.4 mg at 03/15/22 2157   Exam: Current vital signs: BP 135/69 (BP Location: Left Arm)   Pulse 60   Temp 97.6 F (36.4 C) (Oral)   Resp 18   Ht '6\' 4"'$  (1.93 m)   Wt 88.8 kg   SpO2 98%   BMI 23.83 kg/m  Vital signs in last 24 hours: Temp:  [97.6 F (36.4 C)-98 F (36.7 C)] 97.6 F (36.4 C) (02/28 1313) Pulse Rate:  [59-60] 60 (02/28 1313) Resp:  [16-20] 18 (02/28 1313) BP: (120-145)/(58-82) 135/69 (02/28 1313) SpO2:  [94 %-100 %] 98 % (02/28 1313)  GENERAL: Awake, alert in NAD HEENT: - Normocephalic and atraumatic, dry mm LUNGS - Clear to auscultation bilaterally with no wheezes CV - S1S2 RRR, no m/r/g, equal pulses bilaterally. ABDOMEN - Soft, nontender, nondistended with normoactive BS Ext: warm, well perfused, intact peripheral pulses, no edema  NEURO:  Mental Status: AA&Ox3  Language: speech is clear.  Naming, repetition, fluency, and comprehension intact. Cranial Nerves: PERRL EOMI, visual fields full, slight facial asymmetry, facial sensation intact, hearing intact, tongue/uvula/soft palate midline, normal sternocleidomastoid and trapezius muscle strength. No evidence of tongue atrophy or fibrillations Motor: 5/5 in all 4 extremities  Tone: is normal and bulk is normal Sensation- Intact to light touch bilaterally Coordination: FTN intact bilaterally, no ataxia in BLE. Gait- deferred  NIHSS 1a Level of Conscious.: 0 1b LOC Questions: 0 1c LOC Commands: 0 2 Best Gaze: 0 3 Visual: 0 4 Facial Palsy: 1 5a Motor Arm - left: 0 5b Motor Arm - Right: 0 6a Motor Leg - Left: 0 6b Motor Leg - Right: 0 7 Limb Ataxia: 0 8 Sensory: 0 9 Best  Language: 0 10 Dysarthria: 0 11 Extinct. and Inatten.: 0 TOTAL: 1   Labs I have reviewed labs in epic and the results pertinent to this consultation are:  CBC    Component Value Date/Time   WBC 5.6 03/15/2022 0312   RBC 3.79 (L) 03/15/2022 0312   HGB 12.5 (L) 03/15/2022 0312   HGB 13.4 01/16/2019 0838   HCT 37.2 (L) 03/15/2022 0312   HCT 41.1 01/16/2019 0838   PLT 160 03/15/2022 0312   PLT 188 01/16/2019 0838   MCV 98.2 03/15/2022 0312   MCV 97 01/16/2019 0838   MCH 33.0 03/15/2022 0312   MCHC 33.6 03/15/2022 0312   RDW 13.5 03/15/2022 0312   RDW 12.9 01/16/2019 0838   LYMPHSABS 0.9 03/14/2022 2040   LYMPHSABS 0.6 (L) 01/16/2019 0838   MONOABS 0.6 03/14/2022 2040   EOSABS 0.1 03/14/2022  2040   EOSABS 0.3 01/16/2019 0838   BASOSABS 0.1 03/14/2022 2040   BASOSABS 0.1 01/16/2019 0838    CMP     Component Value Date/Time   NA 138 03/14/2022 2040   NA 143 01/16/2019 0838   K 4.0 03/14/2022 2040   CL 107 03/14/2022 2040   CO2 24 03/14/2022 2040   GLUCOSE 110 (H) 03/14/2022 2040   BUN 28 (H) 03/14/2022 2040   BUN 15 01/16/2019 0838   CREATININE 0.99 03/15/2022 0311   CREATININE 1.18 08/14/2020 1112   CALCIUM 8.7 (L) 03/14/2022 2040   PROT 6.8 03/14/2022 2040   PROT 6.2 01/16/2019 0838   ALBUMIN 3.9 03/14/2022 2040   ALBUMIN 4.3 01/16/2019 0838   AST 20 03/14/2022 2040   ALT <5 03/14/2022 2040   ALKPHOS 81 03/14/2022 2040   BILITOT 0.7 03/14/2022 2040   BILITOT 0.3 01/16/2019 0838   GFRNONAA >60 03/15/2022 0311   GFRAA 61 01/16/2019 0838    Lipid Panel     Component Value Date/Time   CHOL 159 03/15/2022 0312   TRIG 59 03/15/2022 0312   HDL 75 03/15/2022 0312   CHOLHDL 2.1 03/15/2022 0312   VLDL 12 03/15/2022 0312   LDLCALC 72 03/15/2022 0312    Imaging I have reviewed the images obtained:  CT-head no acute process Chronic small vessel ischemic disease and cerebral atrophy.   CTA head and neck No LVO   Repeat CT head no acute process  Unable to  have MRI due to PPM not compatible   2D echo EF 55-60%  LDL-c 72  A1c 5.9  Assessment:  89 y.o. male with medical history significant of hypertension, first-degree heart block, TIA, hypothyroidism, COPD, bipolar disorder, and BPH who presented with complaints of intermittent right leg weakness.   Left hemispheric TIA vs small left hemisphere stroke not visualized on CT.  Unable to obtain MRI due to incompatible pacemaker  Recommendations: - ASA 81 mg and Plavix '75mg'$  for 3 weeks than Plavix alone  - PT/OT - Continue statin  - Outpatient neurology with Dr. Carles Collet  follow up in 8 weeks after discharge  - Neurology will sign off. Please call with questions or concerns   Beulah Gandy DNP, ACNPC-AG  [facility]  STROKE MD NOTE :  I have personally obtained history,examined this patient, reviewed notes, independently viewed imaging studies, participated in medical decision making and plan of care.ROS completed by me personally and pertinent positives fully documented  I have made any additions or clarifications directly to the above note. Agree with note above.  Patient presented with 2 brief episodes of transient right leg weakness likely from left hemispheric TIA probably from small vessel disease.  CT head is negative and MRI cannot be obtained due to incompatible pacemaker.  CT angiogram shows no large vessel stenosis or occlusion.  Recommend dual antiplatelet therapy aspirin and Plavix for 3 weeks followed by Plavix alone.  Aggressive risk factor modification.  Continue ongoing stroke workup.  Therapy consults.  Outpatient follow-up with her neurologist Dr. Carles Collet.  Kindly call for questions.  Discussed with patient and Dr. [place] Pink.  Greater than 50% time during this 60-minute consult visit was spent in counseling and coordination of care about TIA and discussion about stroke evaluation, prevention and treatment and answering questions.  Antony Contras, MD Medical Director [facility]  Stroke Center Pager: [phone] 03/16/2022 3:00 PM

## 2022-03-16 NOTE — Plan of Care (Signed)
  Problem: Education: Goal: Knowledge of General Education information will improve Description: Including pain rating scale, medication(s)/side effects and non-pharmacologic comfort measures Outcome: Progressing   Problem: Health Behavior/Discharge Planning: Goal: Ability to manage health-related needs will improve Outcome: Progressing   Problem: Clinical Measurements: Goal: Ability to maintain clinical measurements within normal limits will improve Outcome: Progressing Goal: Will remain free from infection Outcome: Progressing Goal: Diagnostic test results will improve Outcome: Progressing   Problem: Activity: Goal: Risk for activity intolerance will decrease Outcome: Progressing   Problem: Nutrition: Goal: Adequate nutrition will be maintained Outcome: Progressing   Problem: Elimination: Goal: Will not experience complications related to bowel motility Outcome: Progressing   Problem: Pain Managment: Goal: General experience of comfort will improve Outcome: Progressing   Problem: Safety: Goal: Ability to remain free from injury will improve Outcome: Progressing   Problem: Skin Integrity: Goal: Risk for impaired skin integrity will decrease Outcome: Progressing   Problem: Education: Goal: Knowledge of disease or condition will improve Outcome: Progressing Goal: Knowledge of secondary prevention will improve (MUST DOCUMENT ALL) Outcome: Progressing Goal: Knowledge of patient specific risk factors will improve Elta [place] N/A or DELETE if not current risk factor) Outcome: Progressing   Problem: Coping: Goal: Will identify appropriate support needs Outcome: Progressing   Problem: Health Behavior/Discharge Planning: Goal: Ability to manage health-related needs will improve Outcome: Progressing Goal: Goals will be collaboratively established with patient/family Outcome: Progressing   Problem: Self-Care: Goal: Verbalization of feelings and concerns over difficulty  with self-care will improve Outcome: Progressing   Problem: Nutrition: Goal: Dietary intake will improve Outcome: Progressing

## 2022-03-16 NOTE — TOC Initial Note (Signed)
Transition of Care [facility]) - Initial/Assessment Note    Patient Details  Name: Victor Osborne MRN: [id number] Date of Birth: 12/22/1932  [facility]) CM/SW Contact:    Pollie Friar, RN Phone Number: 03/16/2022, 11:50 AM  Clinical Narrative:                 Pt is from home (ILF at [facility]) alone. He states his girlfriend, her daughter and his daughter check on him.  Pt drives self as needed. Pt states his girlfriend can assist with transportation as needed.  Pt manages his own medications and denies any issues.  Pt is currently attending outpatient therapy in [facility]. No f/u per PT/OT.  TOC following for d/c needs.  Expected Discharge Plan: Home/Self Care Barriers to Discharge: Continued Medical Work up   Patient Goals and CMS Choice            Expected Discharge Plan and Services   Discharge Planning Services: CM Consult   Living arrangements for the past 2 months: Apartment                                      Prior Living Arrangements/Services Living arrangements for the past 2 months: Apartment Lives with:: Self Patient language and need for interpreter reviewed:: Yes Do you feel safe going back to the place where you live?: Yes          Current home services: DME (shower seat/ cane/ rollator) Criminal Activity/Legal Involvement Pertinent to Current Situation/Hospitalization: No - Comment as needed  Activities of Daily Living Home Assistive Devices/Equipment: Cane (specify quad or straight), Walker (specify type) (rollator) ADL Screening (condition at time of admission) Patient's cognitive ability adequate to safely complete daily activities?: Yes Is the patient deaf or have difficulty hearing?: Yes Does the patient have difficulty seeing, even when wearing glasses/contacts?: No Does the patient have difficulty concentrating, remembering, or making decisions?: No Patient able to express need for assistance with ADLs?:  Yes Does the patient have difficulty dressing or bathing?: No Independently performs ADLs?: Yes (appropriate for developmental age) Does the patient have difficulty walking or climbing stairs?: Yes Weakness of Legs: None Weakness of Arms/Hands: None  Permission Sought/Granted                  Emotional Assessment Appearance:: Appears stated age Attitude/Demeanor/Rapport: Engaged Affect (typically observed): Accepting Orientation: : Oriented to Self, Oriented to Place, Oriented to  Time, Oriented to Situation   Psych Involvement: No (comment)  Admission diagnosis:  TIA (transient ischemic attack) [G45.9] Patient Active Problem List   Diagnosis Date Noted   Hyperlipidemia 03/15/2022   TIA (transient ischemic attack) 03/14/2022   History of neoplasm 04/12/2021   Melanocytic nevi of trunk 04/12/2021   Other seborrheic keratosis 04/12/2021   Cerebral embolism with cerebral infarction 03/31/2021   Left leg weakness 03/30/2021   Chronic post-traumatic stress disorder 12/05/2017   Bipolar I disorder, current or most recent episode depressed, in partial remission ([place]) 12/05/2017   CHB (complete heart block) ([place]) 01/27/2017   HTN (hypertension) 01/27/2017   Hypothyroidism 10/11/2016   Parkinson disease 09/08/2016   Chronic osteoarthritis 03/20/2015   Bipolar 1 disorder ([place]) 03/20/2015   Tremor 03/20/2015   Pacemaker 09/24/2013   SSS (sick sinus syndrome) ([place]) 06/19/2013   First degree AV block 04/09/2013   Allergic rhinitis 03/17/2013   Obstructive sleep apnea 12/18/2006  COPD mixed type ([place]) 12/18/2006   History of malignant melanoma of skin 12/18/2006   Diabetes mellitus ([place]) 12/18/2006   PCP:  [name], [place] Pharmacy:   [facility] - [place], [place] [address] Phone: [phone] Fax: [phone]  [facility] [phone] - [place], [place] - [address] AT [address] [place] [address] Phone: [phone] Fax: [phone]  [facility] [address] [place] [address] Phone: [phone] Fax: [phone]     Social Determinants of Health (SDOH) Social History: SDOH Screenings   Food Insecurity: No Food Insecurity (03/15/2022)  Housing: Low Risk  (03/15/2022)  Transportation Needs: No Transportation Needs (03/15/2022)  Utilities: Not At Risk (03/15/2022)  Alcohol Screen: Low Risk  (05/14/2021)  Depression (PHQ2-9): Low Risk  (11/16/2021)  Financial Resource Strain: Low Risk  (05/14/2021)  Physical Activity: Sufficiently Active (05/14/2021)  Social Connections: Moderately Integrated (05/14/2021)  Stress: No Stress Concern Present (05/14/2021)  Tobacco Use: Medium Risk (03/14/2022)   SDOH Interventions:     Readmission Risk Interventions     No data to display

## 2022-03-16 NOTE — Progress Notes (Signed)
[facility] PROGRESS NOTE   Laytin Rubiano [id number] DOB: 02-05-1932 DOA: 03/14/2022  PCP: [name], FNP  Brief History/Interval Summary: 89 y.o. male with medical history significant of hypertension, first-degree heart block, TIA, hypothyroidism, COPD, bipolar disorder, and BPH who presented with complaints of intermittent right leg weakness.  Patient lives in independent living at a senior living facility and ambulates with use of a rollator.  Patient has a pacemaker so cannot undergo MRI.  Neurology has been consulted.    Consultants: Neurology  Procedures: Transthoracic echocardiogram    Subjective/Interval History: Patient mentions that he feels well.  Denies any complaints.  The right leg weakness has improved.    Assessment/Plan:  TIA Could not undergo MRI due to presence of pacemaker.  CT head was repeated and does not show any new findings.  His neurological deficits have resolved. LDL is 72.  Noted to be on pravastatin.  Will see if neurology wants to change to alternative.   HbA1c 5.9.  TSH is normal at 4.1.  Echocardiogram shows normal systolic function. Seen by PT and OT.  No needs identified. To be seen by stroke service today. Patient currently on aspirin and Plavix.  It appears that he was just on aspirin prior to admission.  History of heart block status post pacemaker Stable.  Parkinson's disease Continue Sinemet.  History of COPD Stable.  Bipolar disorder Stable.  Continue home medications.  Hyperlipidemia LDL is 72.  Continue with statin.  Hypothyroidism Continue medications.  TSH normal.  DVT Prophylaxis: Lovenox Code Status: Full code Family Communication: Discussed with patient Disposition Plan: Hopefully return home when cleared by neurology  Status is: Inpatient Remains inpatient appropriate because: TIA      Medications: Scheduled:  aspirin EC  325 mg Oral Daily   carbamazepine  200 mg Oral TID    carbidopa-levodopa  2 tablet Oral TID   clopidogrel  75 mg Oral Daily   enoxaparin (LOVENOX) injection  40 mg Subcutaneous Q24H   FLUoxetine  10 mg Oral Daily   levothyroxine  125 mcg Oral QAC breakfast   pravastatin  80 mg Oral Daily   QUEtiapine  50 mg Oral QHS   tamsulosin  0.4 mg Oral QHS   Continuous: [id number] **OR** acetaminophen (TYLENOL) oral liquid 160 mg/5 mL **OR** acetaminophen, hydrALAZINE, senna-docusate  Antibiotics: Anti-infectives (From admission, onward)    None       Objective:  Vital Signs  Vitals:   03/15/22 2003 03/15/22 2320 03/16/22 0353 03/16/22 0800  BP: 129/75 132/75 (!) 120/58 (!) 120/59  Pulse: 60 (!) 59 (!) 59 60  Resp: '16 16 16 17  '$ Temp: 97.6 F (36.4 C) 97.6 F (36.4 C) 98 F (36.7 C) 97.7 F (36.5 C)  TempSrc: Oral Oral Oral Oral  SpO2: 100% 94% 98% 94%  Weight:      Height:        Intake/Output Summary (Last 24 hours) at 03/16/2022 1038 Last data filed at 03/15/2022 1431 Gross per 24 hour  Intake 954 ml  Output --  Net 954 ml   Filed Weights   03/14/22 2023 03/15/22 0109  Weight: 85.5 kg 88.8 kg    General appearance: Awake alert.  In no distress Resp: Clear to auscultation bilaterally.  Normal effort Cardio: S1-S2 is normal regular.  No S3-S4.  No rubs murmurs or bruit GI: Abdomen is soft.  Nontender nondistended.  Bowel sounds are present normal.  No masses organomegaly Extremities: No edema.  Full range of  motion of lower extremities. Neurologic: Alert and oriented x3.  No focal neurological deficits.    Lab Results:  Data Reviewed: I have personally reviewed following labs and reports of the imaging studies  CBC: Recent Labs  Lab 03/14/22 2040 03/15/22 0312  WBC 6.7 5.6  NEUTROABS 5.1  --   HGB 13.7 12.5*  HCT 41.8 37.2*  MCV 97.9 98.2  PLT 176 0000000    Basic Metabolic Panel: Recent Labs  Lab 03/14/22 2040 03/15/22 0311  NA 138  --   K 4.0  --   CL 107  --   CO2 24  --   GLUCOSE 110*   --   BUN 28*  --   CREATININE 1.16 0.99  CALCIUM 8.7*  --     GFR: Estimated Creatinine Clearance: 64.5 mL/min (by C-G formula based on SCr of 0.99 mg/dL).  Liver Function Tests: Recent Labs  Lab 03/14/22 2040  AST 20  ALT <5  ALKPHOS 81  BILITOT 0.7  PROT 6.8  ALBUMIN 3.9     Coagulation Profile: Recent Labs  Lab 03/14/22 2040  INR 1.0     HbA1C: Recent Labs    03/15/22 0312  HGBA1C 5.9*    CBG: Recent Labs  Lab 03/14/22 2039  GLUCAP 108*    Lipid Profile: Recent Labs    03/15/22 0312  CHOL 159  HDL 75  LDLCALC 72  TRIG 59  CHOLHDL 2.1    Thyroid Function Tests: Recent Labs    03/15/22 0311  TSH 4.142     Radiology Studies: CT HEAD WO CONTRAST (5MM)  Result Date: 03/16/2022 CLINICAL DATA:  Follow-up examination for stroke. EXAM: CT HEAD WITHOUT CONTRAST TECHNIQUE: Contiguous axial images were obtained from the base of the skull through the vertex without intravenous contrast. RADIATION DOSE REDUCTION: This exam was performed according to the departmental dose-optimization program which includes automated exposure control, adjustment of the mA and/or kV according to patient size and/or use of iterative reconstruction technique. COMPARISON:  Prior studies from 03/14/2022. FINDINGS: Brain: Mild age-related cerebral atrophy with chronic small vessel ischemic disease, stable. No acute intracranial hemorrhage. No acute large vessel territory infarct. No mass lesion or midline shift. No hydrocephalus or extra-axial fluid collection. Vascular: No abnormal hyperdense vessel. Skull: Scalp soft tissues and calvarium demonstrate no new finding. Sinuses/Orbits: Globes orbital soft tissues within normal limits. Mild scattered mucosal thickening noted about the ethmoidal air cells. Paranasal sinuses are otherwise clear. No mastoid effusion. Other: None. IMPRESSION: Stable head CT. No acute intracranial abnormality. Electronically Signed   By: Jeannine Boga  M.D.   On: 03/16/2022 02:14   ECHOCARDIOGRAM COMPLETE  Result Date: 03/15/2022    ECHOCARDIOGRAM REPORT   Patient Name:   AIDEEN MEDIATE [facility] Date of Exam: 03/15/2022 Medical Rec #:  [id number]           Height:       76.0 in Accession #:    [id number]          Weight:       195.8 lb Date of Birth:  1932/03/11          BSA:          2.195 m Patient Age:    89 years            BP:           137/58 mmHg Patient Gender: M  HR:           60 bpm. Exam Location:  Inpatient Procedure: 2D Echo, Cardiac Doppler and Color Doppler Indications:    TIA G45.9  History:        Patient has prior history of Echocardiogram examinations, most                 recent 03/31/2021. Pacemaker, TIA and COPD; Risk                 Factors:Hypertension, Sleep Apnea and Diabetes. Complete Heart                 Block.  Sonographer:    Ronny Flurry Referring Phys: [id number] DEBBY CROSLEY  Sonographer Comments: Technically difficult study due to poor echo windows, no parasternal window and no apical window. Image acquisition challenging due to patient body habitus. IMPRESSIONS  1. Left ventricular ejection fraction, by estimation, is 55 to 60%. The left ventricle has normal function. Left ventricular endocardial border not optimally defined to evaluate regional wall motion. Left ventricular diastolic function could not be evaluated.  2. Right ventricular systolic function is normal. The right ventricular size is normal. Tricuspid regurgitation signal is inadequate for assessing PA pressure.  3. The mitral valve is grossly normal. No evidence of mitral valve regurgitation.  4. The aortic valve is tricuspid. Aortic valve regurgitation is not visualized. No aortic stenosis is present.  5. The inferior vena cava is dilated in size with >50% respiratory variability, suggesting right atrial pressure of 8 mmHg. FINDINGS  Left Ventricle: Left ventricular ejection fraction, by estimation, is 55 to 60%. The left ventricle has normal  function. Left ventricular endocardial border not optimally defined to evaluate regional wall motion. The left ventricular internal cavity size was normal in size. There is no left ventricular hypertrophy. Left ventricular diastolic function could not be evaluated. Right Ventricle: The right ventricular size is normal. No increase in right ventricular wall thickness. Right ventricular systolic function is normal. Tricuspid regurgitation signal is inadequate for assessing PA pressure. Left Atrium: Left atrial size was normal in size. Right Atrium: Right atrial size was normal in size. Pericardium: There is no evidence of pericardial effusion. Mitral Valve: The mitral valve is grossly normal. No evidence of mitral valve regurgitation. Tricuspid Valve: The tricuspid valve is normal in structure. Tricuspid valve regurgitation is not demonstrated. Aortic Valve: The aortic valve is tricuspid. Aortic valve regurgitation is not visualized. No aortic stenosis is present. Pulmonic Valve: The pulmonic valve was normal in structure. Pulmonic valve regurgitation is not visualized. Aorta: The aortic root is normal in size and structure. Venous: The inferior vena cava is dilated in size with greater than 50% respiratory variability, suggesting right atrial pressure of 8 mmHg. IAS/Shunts: No atrial level shunt detected by color flow Doppler. Additional Comments: A device lead is visualized in the right ventricle.  IVC IVC diam: 2.30 cm AORTIC VALVE LVOT Vmax:   74.70 cm/s LVOT Vmean:  47.050 cm/s LVOT VTI:    0.156 m  SHUNTS Systemic VTI: 0.16 m Dani Gobble Croitoru MD Electronically signed by Sanda Klein MD Signature Date/Time: 03/15/2022/1:29:04 PM    Final    CT ANGIO HEAD NECK W WO CM  Result Date: 03/14/2022 CLINICAL DATA:  Initial evaluation for TIA. EXAM: CT ANGIOGRAPHY HEAD AND NECK TECHNIQUE: Multidetector CT imaging of the head and neck was performed using the standard protocol during bolus administration of intravenous  contrast. Multiplanar CT image reconstructions and MIPs were obtained to evaluate  the vascular anatomy. Carotid stenosis measurements (when applicable) are obtained utilizing NASCET criteria, using the distal internal carotid diameter as the denominator. RADIATION DOSE REDUCTION: This exam was performed according to the departmental dose-optimization program which includes automated exposure control, adjustment of the mA and/or kV according to patient size and/or use of iterative reconstruction technique. CONTRAST:  2m OMNIPAQUE IOHEXOL 350 MG/ML SOLN COMPARISON:  Prior studies from earlier the same day and 03/31/2021. FINDINGS: CTA NECK FINDINGS Aortic arch: Visualized aortic arch normal in caliber with standard 3 vessel morphology. Mild atheromatous change about the arch itself. No high-grade stenosis about the origin the great vessels. Right carotid system: Right common and internal carotid arteries are patent without dissection. Mild for age atheromatous change about the right carotid bulb without hemodynamically significant stenosis. Left carotid system: Left common and internal carotid arteries are patent without dissection. Mild for age atheromatous change about the left carotid bulb without hemodynamically significant stenosis. Vertebral arteries: Both vertebral arteries arise from the subclavian arteries. No proximal subclavian artery stenosis. Focal moderate stenoses noted about the bilateral V2 segments at the level of C4-5 due to extrinsic compression from advanced uncovertebral disease. Otherwise, vertebral arteries are widely patent without stenosis or dissection. Skeleton: No discrete or worrisome osseous lesions. Advanced multilevel cervical spondylosis. Other neck: No other acute soft tissue abnormality within the neck. Upper chest: Emphysema with superimposed scattered mild subsegmental atelectatic changes. Left-sided pacemaker/AICD in place. Review of the MIP images confirms the above findings  CTA HEAD FINDINGS Anterior circulation: Mild atheromatous change within the right carotid siphon without hemodynamically significant stenosis. A1 segments, anterior communicating complex common anterior cerebral arteries widely patent proximally without stenosis. No M1 stenosis or occlusion. No proximal MCA branch occlusion or high-grade stenosis. Distal MCA branches perfused and symmetric. Posterior circulation: Both V4 segments patent without stenosis. Both PICA patent at their origins. Basilar widely patent without stenosis. Superior cerebellar and posterior cerebral arteries widely patent bilaterally. Venous sinuses: Patent allowing for timing the contrast bolus. Anatomic variants: None significant.  No aneurysm. Review of the MIP images confirms the above findings IMPRESSION: 1. Negative CTA for large vessel occlusion or other emergent finding. 2. Mild for age atheromatous change about the carotid bifurcations and carotid siphons without hemodynamically significant stenosis. Aortic Atherosclerosis (ICD10-I70.0) and Emphysema (ICD10-J43.9). Electronically Signed   By: BJeannine BogaM.D.   On: 03/14/2022 22:54   CT HEAD WO CONTRAST  Result Date: 03/14/2022 CLINICAL DATA:  Extremity weakness.  TIA EXAM: CT HEAD WITHOUT CONTRAST TECHNIQUE: Contiguous axial images were obtained from the base of the skull through the vertex without intravenous contrast. RADIATION DOSE REDUCTION: This exam was performed according to the departmental dose-optimization program which includes automated exposure control, adjustment of the mA and/or kV according to patient size and/or use of iterative reconstruction technique. COMPARISON:  CT head 03/31/2021 FINDINGS: Brain: No intracranial hemorrhage, mass effect, or evidence of acute infarct. No hydrocephalus. No extra-axial fluid collection. Generalized cerebral atrophy. Ill-defined hypoattenuation within the cerebral white matter is nonspecific but consistent with chronic  small vessel ischemic disease. Vascular: No hyperdense vessel. Intracranial arterial calcification. Skull: No fracture or focal lesion. Sinuses/Orbits: No acute finding. Paranasal sinuses and mastoid air cells are well aerated. Other: None. IMPRESSION: 1. No evidence of acute intracranial abnormality. 2. Chronic small vessel ischemic disease and cerebral atrophy. Electronically Signed   By: TPlacido SouM.D.   On: 03/14/2022 21:07       LOS: 1 day   [facility]  Pager on [email or web address]  03/16/2022, 10:38 AM

## 2022-03-16 NOTE — Progress Notes (Signed)
Mobility Specialist: Progress Note   03/16/22 1111  Mobility  Activity Ambulated with assistance in hallway  Level of Assistance Minimal assist, patient does 75% or more  Assistive Device Cane  Distance Ambulated (ft) 300 ft (150'x2)  Activity Response Tolerated fair  Mobility Referral Yes  $Mobility charge 1 Mobility   During Mobility: 79 HR, 100% SpO2  Pt received in the bed and agreeable to mobility. Mod I with bed mobility and contact guard during ambulation. LOB x1 to Rt side requiring minA to correct secondary to RLE weakness. Stopped for seated break as well. Pt able to ambulate back to the room. Pt is back in bed with call bell and phone at his side. Bed alarm is on.   Victor Osborne Mobility Specialist Please contact via SecureChat or Rehab office at [phone]

## 2022-03-16 NOTE — Progress Notes (Signed)
Mobility Specialist: Progress Note   03/16/22 1540  Mobility  Activity Ambulated with assistance in hallway  Level of Assistance Contact guard assist, steadying assist  Assistive Device Cane  Distance Ambulated (ft) 400 ft  Activity Response Tolerated well  Mobility Referral Yes  $Mobility charge 1 Mobility   Received pt in bed having no complaints and agreeable to mobility. Pt was asymptomatic throughout ambulation and returned to room w/o fault. Left in bed w/ call bell in reach and all needs met.  [place] Victor Osborne Mobility Specialist Please contact via SecureChat or Rehab office at [phone]

## 2022-03-16 NOTE — Plan of Care (Signed)

## 2022-03-17 ENCOUNTER — Ambulatory Visit: Payer: Medicare Other | Admitting: Psychiatry

## 2022-03-17 DIAGNOSIS — G459 Transient cerebral ischemic attack, unspecified: Secondary | ICD-10-CM | POA: Diagnosis not present

## 2022-03-17 MED ORDER — ASPIRIN 81 MG PO TBEC
81.0000 mg | DELAYED_RELEASE_TABLET | Freq: Every day | ORAL | Status: DC
  Administered 2022-03-17: 81 mg via ORAL
  Filled 2022-03-17: qty 1

## 2022-03-17 NOTE — Progress Notes (Signed)
Mobility Specialist: Progress Note   03/17/22 1011  Mobility  Activity Ambulated with assistance in hallway  Level of Assistance Contact guard assist, steadying assist  Assistive Device Cane  Distance Ambulated (ft) 400 ft  Activity Response Tolerated well  Mobility Referral Yes  $Mobility charge 1 Mobility   Received pt in bed having no complaints and agreeable to mobility. Pt was asymptomatic throughout ambulation and returned to room w/o fault. To BR after session, void successful, then back to bed w/ call bell in reach and all needs met. Bed alarm is on.   [place] Ozetta Flatley Mobility Specialist Please contact via SecureChat or Rehab office at [phone]

## 2022-03-17 NOTE — TOC Transition Note (Signed)
Transition of Care [facility]) - CM/SW Discharge Note   Patient Details  Name: Victor Osborne MRN: [id number] Date of Birth: 06-26-1932  [facility] CM/SW Contact:  Pollie Friar, RN Phone Number: 03/17/2022, 9:49 AM   Clinical Narrative:    Pt is discharging home with self care. No needs per TOC.   Final next level of care: Home/Self Care Barriers to Discharge: No Barriers Identified   Patient Goals and CMS Choice      Discharge Placement                         Discharge Plan and Services Additional resources added to the After Visit Summary for     Discharge Planning Services: CM Consult                                 Social Determinants of Health (SDOH) Interventions SDOH Screenings   Food Insecurity: No Food Insecurity (03/15/2022)  Housing: Low Risk  (03/15/2022)  Transportation Needs: No Transportation Needs (03/15/2022)  Utilities: Not At Risk (03/15/2022)  Alcohol Screen: Low Risk  (05/14/2021)  Depression (PHQ2-9): Low Risk  (11/16/2021)  Financial Resource Strain: Low Risk  (05/14/2021)  Physical Activity: Sufficiently Active (05/14/2021)  Social Connections: Moderately Integrated (05/14/2021)  Stress: No Stress Concern Present (05/14/2021)  Tobacco Use: Medium Risk (03/14/2022)     Readmission Risk Interventions     No data to display

## 2022-03-17 NOTE — Plan of Care (Signed)
  Problem: Education: Goal: Knowledge of General Education information will improve Description: Including pain rating scale, medication(s)/side effects and non-pharmacologic comfort measures Outcome: Progressing   Problem: Health Behavior/Discharge Planning: Goal: Ability to manage health-related needs will improve Outcome: Progressing   Problem: Clinical Measurements: Goal: Ability to maintain clinical measurements within normal limits will improve Outcome: Progressing Goal: Will remain free from infection Outcome: Progressing   Problem: Nutrition: Goal: Adequate nutrition will be maintained Outcome: Progressing   Problem: Elimination: Goal: Will not experience complications related to bowel motility Outcome: Progressing   Problem: Pain Managment: Goal: General experience of comfort will improve Outcome: Progressing   Problem: Safety: Goal: Ability to remain free from injury will improve Outcome: Progressing   Problem: Skin Integrity: Goal: Risk for impaired skin integrity will decrease Outcome: Progressing   Problem: Education: Goal: Knowledge of disease or condition will improve Outcome: Progressing   Problem: Coping: Goal: Will identify appropriate support needs Outcome: Progressing   Problem: Health Behavior/Discharge Planning: Goal: Goals will be collaboratively established with patient/family Outcome: Progressing

## 2022-03-18 ENCOUNTER — Encounter: Payer: Self-pay | Admitting: *Deleted

## 2022-03-18 ENCOUNTER — Telehealth: Payer: Self-pay | Admitting: *Deleted

## 2022-03-18 LAB — CUP PACEART REMOTE DEVICE CHECK
Battery Remaining Longevity: 42 mo
Battery Remaining Percentage: 44 %
Brady Statistic RA Percent Paced: 91 %
Brady Statistic RV Percent Paced: 67 %
Date Time Interrogation Session: 20240229193100
Implantable Lead Connection Status: 753985
Implantable Lead Connection Status: 753985
Implantable Lead Implant Date: 20150603
Implantable Lead Implant Date: 20150603
Implantable Lead Location: 753859
Implantable Lead Location: 753860
Implantable Lead Model: 4136
Implantable Lead Model: 4137
Implantable Lead Serial Number: 29477746
Implantable Lead Serial Number: 29617326
Implantable Pulse Generator Implant Date: 20150603
Lead Channel Impedance Value: 523 Ohm
Lead Channel Impedance Value: 744 Ohm
Lead Channel Pacing Threshold Amplitude: 1 V
Lead Channel Pacing Threshold Amplitude: 2 V
Lead Channel Pacing Threshold Pulse Width: 0.4 ms
Lead Channel Pacing Threshold Pulse Width: 0.5 ms
Lead Channel Setting Pacing Amplitude: 2 V
Lead Channel Setting Pacing Amplitude: 2.2 V
Lead Channel Setting Pacing Pulse Width: 0.4 ms
Lead Channel Setting Sensing Sensitivity: 2.5 mV
Pulse Gen Serial Number: 389736
Zone Setting Status: 755011

## 2022-03-18 NOTE — Transitions of Care (Post Inpatient/ED Visit) (Signed)
03/18/2022  Name: Kohlman Bianca MRN: [id number] DOB: 1932-03-15  Today's TOC FU Call Status: Today's TOC FU Call Status:: Successful TOC FU Call Competed TOC FU Call Complete Date: 03/18/22  Transition Care Management Follow-up Telephone Call Date of Discharge: 03/17/22 Discharge [facility]: [facility]) Type of Discharge: Inpatient Admission Primary Inpatient Discharge Diagnosis:: TIA How have you been since you were released from the hospital?: Better ("I am doing better, not having any problems; I am pretty much back to my normal self.  I live at [facility] in their independent living section, and my family are in and out all the time and make sure I have everything I need.") Any questions or concerns?: No  Items Reviewed: Did you receive and understand the discharge instructions provided?: Yes (thoroughly reviewed with patient and his daughter Deliah Goody today who both verbalize very good understanding of same) Medications obtained and verified?: Yes (Medications Reviewed) (confirmed patient obtained/ is taking all newly Rx'd medications post-hospital discharge; self-manages medications; denies questions/ concerns around meds; declines full medication review- is not currently at home) Any new allergies since your discharge?: No Dietary orders reviewed?: Yes Type of Diet Ordered:: low salt/ heart healthy Do you have support at home?: Yes People in Home: alone Name of Support/Comfort Primary Source: "My family- daughter Deliah Goody and Barbera Setters;" patient reports he is independent in self-care activities  [place] and Equipment/Supplies: [place] Ordered?: No Any new equipment or medical supplies ordered?: No  Functional Questionnaire: Do you need assistance with bathing/showering or dressing?: No Do you need assistance with meal preparation?: No Do you need assistance with eating?: No Do you have difficulty maintaining continence: No Do you need assistance  with getting out of bed/getting out of a chair/moving?: No Do you have difficulty managing or taking your medications?: No  Folllow up appointments reviewed: PCP Follow-up appointment confirmed?: Yes (care coordination outreach in real-time with scheduling care guide to successfully schedule PCP hospital follow up apointment on 03/22/22) Date of PCP follow-up appointment?: 03/22/22 Follow-up Provider: PCP [facility] Follow-up appointment confirmed?: Yes Date of Specialist follow-up appointment?: 04/07/22 Follow-Up Specialty Provider:: Neurology provider Do you need transportation to your follow-up appointment?: No Do you understand care options if your condition(s) worsen?: Yes-patient verbalized understanding  SDOH Interventions Today    Flowsheet Row Most Recent Value  SDOH Interventions   Food Insecurity Interventions Intervention Not Indicated  Housing Interventions Intervention Not Indicated  [lives at [facility] in [place]  Transportation Interventions Intervention Not Indicated  [normally drives self,  family assisting post-hospital discharge/ assists as indicated at baseline]      TOC Interventions Today    Flowsheet Row Most Recent Value  TOC Interventions   TOC Interventions Discussed/Reviewed TOC Interventions Discussed, Arranged PCP follow up within 7 days/Care Guide scheduled, Post discharge activity limitations per provider  [care coordination outreach in real-time with scheduling care guide to successfully schedule PCP hospital follow up apointment on 03/22/22,  declined further care coordination outreach- provided my direct number should needs arise post-TOC call today]      Interventions Today    Flowsheet Row Most Recent Value  Chronic Disease   Chronic disease during today's visit Hypertension (HTN), Other  [TIA,  Parkinson's disease]  General Interventions   General Interventions Discussed/Reviewed General Interventions Discussed, Doctor  Visits  Doctor Visits Discussed/Reviewed Doctor Visits Discussed, PCP, Specialist  PCP/Specialist Visits Compliance with follow-up visit  Education Interventions   Education Provided Provided Education  Provided Verbal  Education On Medication, When to see the doctor, Nutrition  [purpose of newly prescribed ACT/ antiplatelet therapy,  need to clarify dietary restrictions with PCP at upcoming scheduled PCP office visit]  Nutrition Interventions   Nutrition Discussed/Reviewed Decreasing salt, Nutrition Discussed  Pharmacy Interventions   Pharmacy Dicussed/Reviewed Pharmacy Topics Discussed, Medications and their functions      Oneta Rack, RN, BSN, CCRN Alumnus RN CM Care Coordination/ Transition of [place] [facility] [phone]: direct office

## 2022-03-22 ENCOUNTER — Encounter: Payer: Self-pay | Admitting: Family

## 2022-03-22 ENCOUNTER — Ambulatory Visit (INDEPENDENT_AMBULATORY_CARE_PROVIDER_SITE_OTHER): Payer: Medicare Other | Admitting: Family

## 2022-03-22 VITALS — BP 116/72 | HR 60 | Resp 18 | Ht 76.0 in | Wt 198.8 lb

## 2022-03-22 DIAGNOSIS — G459 Transient cerebral ischemic attack, unspecified: Secondary | ICD-10-CM

## 2022-03-22 MED ORDER — CLOPIDOGREL BISULFATE 75 MG PO TABS: 75.0000 mg | ORAL_TABLET | Freq: Every day | ORAL | 3 refills | Status: DC

## 2022-03-22 NOTE — Patient Instructions (Addendum)
Neurology recommends aspirin and Plavix for 3 weeks followed by Plavix alone.  You will stop aspirin on 04/07/22.

## 2022-03-22 NOTE — Progress Notes (Signed)
Victor Osborne is a 89 y.o. male with the following history as recorded in EpicCare:  Patient Active Problem List   Diagnosis Date Noted   Hyperlipidemia 03/15/2022   TIA (transient ischemic attack) 03/14/2022   History of neoplasm 04/12/2021   Melanocytic nevi of trunk 04/12/2021   Other seborrheic keratosis 04/12/2021   Cerebral embolism with cerebral infarction 03/31/2021   Left leg weakness 03/30/2021   Chronic post-traumatic stress disorder 12/05/2017   Bipolar I disorder, current or most recent episode depressed, in partial remission ([place]) 12/05/2017   CHB (complete heart block) ([place]) 01/27/2017   HTN (hypertension) 01/27/2017   Hypothyroidism 10/11/2016   Parkinson disease 09/08/2016   Chronic osteoarthritis 03/20/2015   Bipolar 1 disorder ([place]) 03/20/2015   Tremor 03/20/2015   Pacemaker 09/24/2013   SSS (sick sinus syndrome) ([place]) 06/19/2013   First degree AV block 04/09/2013   Allergic rhinitis 03/17/2013   Obstructive sleep apnea 12/18/2006   COPD mixed type ([place]) 12/18/2006   History of malignant melanoma of skin 12/18/2006   Diabetes mellitus ([place]) 12/18/2006    Current Outpatient Medications  Medication Sig Dispense Refill   acetaminophen (TYLENOL) 325 MG tablet Take 650 mg by mouth every 6 (six) hours as needed for mild pain or headache.     alclomethasone (ACLOVATE) 0.05 % ointment Apply 1 application topically 2 (two) times daily as needed (for rash).     aspirin EC 81 MG tablet Take 1 tablet (81 mg total) by mouth daily for 21 days. Swallow whole. 21 tablet 0   carbamazepine (TEGRETOL) 200 MG tablet Take 1 tablet (200 mg total) by mouth 3 (three) times daily. 270 tablet 1   carbidopa-levodopa (SINEMET IR) 25-100 MG tablet Take 2 tablets by mouth 3 (three) times daily. 540 tablet 1   cholecalciferol (VITAMIN D) 1000 UNITS tablet Take 2,000 Units by mouth daily.     Coenzyme Q10 (CO Q 10 PO) Take 1 capsule by mouth daily.     Flaxseed, Linseed, (FLAX SEED  OIL) 1300 MG CAPS Take 1 capsule by mouth daily.     FLUoxetine (PROZAC) 10 MG capsule TAKE 1 CAPSULE EVERY DAY 90 capsule 0   levothyroxine (LEVOTHROID) 125 MCG tablet Take 1 tablet (125 mcg total) by mouth daily before breakfast. 90 tablet 3   MAGNESIUM PO Take 1 capsule by mouth daily.     Multiple Vitamin (MULITIVITAMIN WITH MINERALS) TABS Take 1 tablet by mouth daily.     Omega-3 Fatty Acids (OMEGA-3 CF PO) Take 1,200 mg by mouth daily.     POTASSIUM GLUCONATE PO Take 1 capsule by mouth daily.     pravastatin (PRAVACHOL) 80 MG tablet Take 1 tablet (80 mg total) by mouth daily. 90 tablet 1   QUEtiapine (SEROQUEL XR) 50 MG TB24 24 hr tablet Take 1 tablet (50 mg total) by mouth at bedtime. 90 tablet 1   tamsulosin (FLOMAX) 0.4 MG CAPS capsule Take 0.4 mg by mouth at bedtime.     clopidogrel (PLAVIX) 75 MG tablet Take 1 tablet (75 mg total) by mouth daily. 90 tablet 3   ferrous sulfate 324 (65 Fe) MG TBEC Take 324 mg by mouth daily. (Patient not taking: Reported on 03/22/2022)     No current facility-administered medications for this visit.    Allergies: Patient has no known allergies.  Past Medical History:  Diagnosis Date   Bipolar 1 disorder ([place])    COPD (chronic obstructive pulmonary disease) ([place])    Depression    First  degree AV block    Hypertension    pt denies but was in medical record   Hypoglycemia, unspecified    Myalgia and myositis, unspecified    Obesity    Pneumonia 1970   Skin cancer (melanoma) ([place])    Thyroid disease    Unspecified hypothyroidism    UPJ obstruction, congenital     Past Surgical History:  Procedure Laterality Date   APPENDECTOMY  1939   malignant melanoma removed from right forehead  2008   PERMANENT PACEMAKER INSERTION N/A 06/19/2013   Procedure: PERMANENT PACEMAKER INSERTION;  Surgeon: [place] Lance, MD;  [facility] C[facility] LAB;  Service: Cardiovascular;  Laterality: N/A;   right big toe  surgery  2009   TRANSURETHRAL RESECTION OF PROSTATE   02/04/2011   Procedure: TRANSURETHRAL RESECTION OF THE PROSTATE (TURP);  Surgeon: Dutch Gray, MD;  Location: [facility] ORS;  Service: Urology;  Laterality: N/A;   URETHRA SURGERY  2010   reconstruction    Family History  Problem Relation Age of Onset   Cerebral aneurysm Mother    Lung cancer Father    Drug abuse Daughter     Social History   Tobacco Use   Smoking status: Former    Packs/day: 2.00    Years: 40.00    Total pack years: 80.00    Types: Cigarettes   Smokeless tobacco: Never  Substance Use Topics   Alcohol use: No    Subjective:   Hospital follow up for TIA; was admitted from 03/14/22-03/17/22; is already scheduled to see his neurologist in follow up later this month; notes that overall he is doing well- still weak but does feel that he is gradually improving; no acute concerns today; Does need Plavix Rx updated;    Objective:  Vitals:   03/22/22 1345  BP: 116/72  Pulse: 60  Resp: 18  SpO2: 97%  Weight: 198 lb 12.8 oz (90.2 kg)  Height: '6\' 4"'$  (1.93 m)    General: Well developed, well nourished, in no acute distress  Skin : Warm and dry.  Head: Normocephalic and atraumatic  Lungs: Respirations unlabored; clear to auscultation bilaterally without wheeze, rales, rhonchi  CVS exam: normal rate and regular rhythm.  Neurologic: Alert and oriented; speech intact; face symmetrical; uses rolling walker for support;  Assessment:  1. TIA (transient ischemic attack)     Plan:  Doing well- no acute concerns; refill updated for Plavix; he will keep planned follow up with neurology; plan to follow up with PCP again in 6 months;   Time spent 30 minutes reviewing notes/ care plan;   Return in about 6 months (around 09/22/2022).  No orders of the defined types were placed in this encounter.   Requested Prescriptions   Signed Prescriptions Disp Refills   clopidogrel (PLAVIX) 75 MG tablet 90 tablet 3    Sig: Take 1 tablet (75 mg total) by mouth daily.

## 2022-04-05 ENCOUNTER — Encounter: Payer: Self-pay | Admitting: Psychiatry

## 2022-04-05 ENCOUNTER — Ambulatory Visit (INDEPENDENT_AMBULATORY_CARE_PROVIDER_SITE_OTHER): Payer: Medicare Other | Admitting: Psychiatry

## 2022-04-05 VITALS — Wt 189.0 lb

## 2022-04-05 DIAGNOSIS — F3175 Bipolar disorder, in partial remission, most recent episode depressed: Secondary | ICD-10-CM | POA: Diagnosis not present

## 2022-04-05 DIAGNOSIS — F4312 Post-traumatic stress disorder, chronic: Secondary | ICD-10-CM | POA: Diagnosis not present

## 2022-04-05 MED ORDER — QUETIAPINE FUMARATE ER 50 MG PO TB24: 50.0000 mg | ORAL_TABLET | Freq: Every day | ORAL | 1 refills | Status: DC

## 2022-04-05 MED ORDER — CARBAMAZEPINE 200 MG PO TABS: 200.0000 mg | ORAL_TABLET | Freq: Three times a day (TID) | ORAL | 1 refills | Status: DC

## 2022-04-05 NOTE — Progress Notes (Unsigned)
Victor Osborne [id number] February 08, 1932 89 y.o.  Subjective:   Patient ID:  Victor Osborne is a 89 y.o. (DOB 1932/02/21) male.  Chief Complaint:  Chief Complaint  Patient presents with   Follow-up    Bipolar disorder    HPI Victor Osborne presents to the office today for follow-up of Bipolar disorder.   He reports that he had a mini-stroke 3-4 weeks ago. He is continuing to experience some residual fatigue. He reports that he has had a "little bit" of depression and "I get with my program" and distances from negativity, gets more rest and sleep, engages in other activities. He denies any manic symptoms. "I think both the manic and depression are well controlled." He reports that he has been sleeping well recently and averaging about 8 hours. He reports that he had some mild hypomania and decreased sleep immediately after hypomania.  Denies anxiety. He reports that motivation is ok overall and occ has to motivate himself due to Parkinson's. He reports that appetite has been good. He notices some mild weight loss and reports that he had some weight loss before TIA. Concentration is adequate. He enjoys word searches and working on hobbies. He notices that concentrating on a task or activity also helps him cope with racing thoughts or negative thoughts. Denies anhedonia. Denies SI.   He reports that he has had some "paranoia, but I can deal with it." He reports that he has some paranoia about being financially taken advantage of.   He has a girlfriend.   Dx'd with Parkinsons about 15-18 years ago.   Past Psychiatric Medication Trials: Carbamazepine Lithium Prozac Remeron Seroquel XR Ambien  AIMS    [place] Office Visit from 09/16/2021 in [place] Total Score 1      PHQ2-9    Flowsheet Row Telephone from 11/16/2021 in [place] Coordination Office Visit from 11/04/2021 in [place]  [facility] at [place] from 05/14/2021 in [facility] Primary Care at [place] Visit from 05/07/2021 in [facility] Care at [facility] Patient Outreach Telephone from 04/12/2021 in [place] Coordination  PHQ-2 Total Score 0 0 0 0 0      Flowsheet Row ED to Hosp-Admission (Discharged) from 03/14/2022 in [place] ED from 05/11/2021 in [facility] Emergency Department at [facility] ED to Hosp-Admission (Discharged) from 03/30/2021 in [place] [facility]  C-SSRS RISK CATEGORY No Risk No Risk No Risk        Review of Systems:  Review of Systems  He notices worsening Parkinson's symptoms and some increased difficulty with balance.   Medications: I have reviewed the patient's current medications.  Current Outpatient Medications  Medication Sig Dispense Refill   acetaminophen (TYLENOL) 325 MG tablet Take 650 mg by mouth every 6 (six) hours as needed for mild pain or headache.     alclomethasone (ACLOVATE) 0.05 % ointment Apply 1 application topically 2 (two) times daily as needed (for rash).     aspirin EC 81 MG tablet Take 1 tablet (81 mg total) by mouth daily for 21 days. Swallow whole. 21 tablet 0   carbamazepine (TEGRETOL) 200 MG tablet Take 1 tablet (200 mg total) by mouth 3 (three) times daily. 270 tablet 1   carbidopa-levodopa (SINEMET IR) 25-100 MG tablet Take 2 tablets by mouth 3 (three) times daily. [place]  tablet 1   cholecalciferol (VITAMIN D) 1000 UNITS tablet Take 2,000 Units by mouth daily.     clopidogrel (PLAVIX) 75 MG tablet Take 1 tablet (75 mg total) by mouth daily. 90 tablet 3   Coenzyme Q10 (CO Q 10 PO) Take 1 capsule by mouth daily.     ferrous sulfate 324 (65 Fe) MG TBEC Take 324 mg by mouth daily. (Patient not taking: Reported on 03/22/2022)     Flaxseed, Linseed, (FLAX SEED OIL) 1300 MG CAPS Take 1 capsule by mouth daily.      FLUoxetine (PROZAC) 10 MG capsule TAKE 1 CAPSULE EVERY DAY 90 capsule 0   levothyroxine (LEVOTHROID) 125 MCG tablet Take 1 tablet (125 mcg total) by mouth daily before breakfast. 90 tablet 3   MAGNESIUM PO Take 1 capsule by mouth daily.     Multiple Vitamin (MULITIVITAMIN WITH MINERALS) TABS Take 1 tablet by mouth daily.     Omega-3 Fatty Acids (OMEGA-3 CF PO) Take 1,200 mg by mouth daily.     POTASSIUM GLUCONATE PO Take 1 capsule by mouth daily.     pravastatin (PRAVACHOL) 80 MG tablet Take 1 tablet (80 mg total) by mouth daily. 90 tablet 1   QUEtiapine (SEROQUEL XR) 50 MG TB24 24 hr tablet Take 1 tablet (50 mg total) by mouth at bedtime. 90 tablet 1   tamsulosin (FLOMAX) 0.4 MG CAPS capsule Take 0.4 mg by mouth at bedtime.     No current facility-administered medications for this visit.    Medication Side Effects: None  Allergies: No Known Allergies  Past Medical History:  Diagnosis Date   Bipolar 1 disorder (H[facility])    COPD (chronic obstructive pulmonary disease) ([facility])    Depression    First degree AV block    Hypertension    pt denies but was in medical record   Hypoglycemia, unspecified    Myalgia and myositis, unspecified    Obesity    Pneumonia 1970   Skin cancer (melanoma) ([place])    Thyroid disease    Unspecified hypothyroidism    UPJ obstruction, congenital     Past Medical History, Surgical history, Social history, and Family history were reviewed and updated as appropriate.   Please see review of systems for further details on the patient's review from today.   Objective:   Physical Exam:  Wt 189 lb (85.7 kg)   BMI 23.01 kg/m   Physical Exam  Lab Review:     Component Value Date/Time   NA 138 03/14/2022 2040   NA 143 01/16/2019 0838   K 4.0 03/14/2022 2040   CL 107 03/14/2022 2040   CO2 24 03/14/2022 2040   GLUCOSE 110 (H) 03/14/2022 2040   BUN 28 (H) 03/14/2022 2040   BUN 15 01/16/2019 0838   CREATININE 0.99 03/15/2022 0311   CREATININE 1.18  08/14/2020 1112   CALCIUM 8.7 (L) 03/14/2022 2040   PROT 6.8 03/14/2022 2040   PROT 6.2 01/16/2019 0838   ALBUMIN 3.9 03/14/2022 2040   ALBUMIN 4.3 01/16/2019 0838   AST 20 03/14/2022 2040   ALT <5 03/14/2022 2040   ALKPHOS 81 03/14/2022 2040   BILITOT 0.7 03/14/2022 2040   BILITOT 0.3 01/16/2019 0838   GFRNONAA >60 03/15/2022 0311   GFRAA 61 01/16/2019 0838       Component Value Date/Time   WBC 5.6 03/15/2022 0312   RBC 3.79 (L) 03/15/2022 0312   HGB 12.5 (L) 03/15/2022 0312   HGB 13.4 01/16/2019 0838   HCT  37.2 (L) 03/15/2022 0312   HCT 41.1 01/16/2019 0838   PLT 160 03/15/2022 0312   PLT 188 01/16/2019 0838   MCV 98.2 03/15/2022 0312   MCV 97 01/16/2019 0838   MCH 33.0 03/15/2022 0312   MCHC 33.6 03/15/2022 0312   RDW 13.5 03/15/2022 0312   RDW 12.9 01/16/2019 0838   LYMPHSABS 0.9 03/14/2022 2040   LYMPHSABS 0.6 (L) 01/16/2019 0838   MONOABS 0.6 03/14/2022 2040   EOSABS 0.1 03/14/2022 2040   EOSABS 0.3 01/16/2019 0838   BASOSABS 0.1 03/14/2022 2040   BASOSABS 0.1 01/16/2019 0838    No results found for: "POCLITH", "LITHIUM"   Lab Results  Component Value Date   CBMZ 5.0 03/15/2022     .res Assessment: Plan:    Victor Osborne was seen today for follow-up.  Diagnoses and all orders for this visit:  Bipolar I disorder, current or most recent episode depressed, in partial remission [facility])     Please see After Visit Summary for patient specific instructions.  Future Appointments  Date Time Provider [place]  04/07/2022  8:45 AM Tat, Eustace Quail, DO [facility] None  05/23/2022  1:30 PM [facility] ANNUAL WELLNESS VISIT 2 [facility] PEC  06/14/2022  1:25 PM [facility] DEVICE REMOTES [facility]  06/27/2022  1:15 PM Percival Spanish, PT [facility]  09/13/2022  1:25 PM [facility] DEVICE REMOTES [facility]  09/29/2022  9:00 AM Baird Lyons D, MD [facility] None  12/13/2022  1:25 PM [facility] DEVICE REMOTES [facility]   03/14/2023  1:25 PM [facility] DEVICE REMOTES [facility]  06/13/2023  1:25 PM [facility] DEVICE REMOTES [facility]    No orders of the defined types were placed in this encounter.   -------------------------------

## 2022-04-05 NOTE — Progress Notes (Unsigned)
Assessment/Plan:   1.  Parkinsons Disease  -Continue carbidopa/levodopa 25/100, 2 tablets 3 times per day  -discussed PT. I will send referral  2.  History of well-controlled bipolar  -Follows with psychiatry.  On Tegretol 200 mg tid and Seroquel XR, 50 mg at bedtime  3.  History of melanoma  -Follows with Dr. Delman Cheadle  -Knows that Parkinson's slightly increases risk for future melanomas as well.  4.  Possible ACA infarct March, 2023 and another possible TIA in February, 2024  -Could not be confirmed because patients pacemaker is not MRI compatible.  -CT did not demonstrate acute infarct on either admission  -Patient has completed his 3 weeks of Plavix/aspirin together.  Nurse practitioner refilled his Plavix for 1 year.  I told him he needed to stop that.  The idea of the Plavix must only be with the aspirin for 3 weeks and then aspirin monotherapy.  -Now on Pravachol, 80 mg daily  5.  Orthostatic hypotension  -Unrelated to Parkinson's.  His Parkinson's is far too mild, and really has not shown significant progression over the years.  -Patient off of midodrine and doing well.    Subjective:   Victor Osborne was seen today in follow up for Parkinsons disease and more recent hospital f/u for possible stroke and orthostasis. My previous records were reviewed prior to todays visit as well as outside records available to me.  Patient was recently in the hospital and those notes are available to me.  I was unaware of this hospital stay until I was preparing for his visit today.  Patient went to the emergency room on February 26 stating that he had intermittent right leg weakness for a few days prior.  Patient states that the symptoms initially seem to come and go.  CT brain on admission to the hospital was negative.  CTA of the head and neck was also unremarkable.  CT brain the day after admission was also without acute infarct.  Patient saw Dr. Leonie Man who recommended aspirin and Plavix  for 3 weeks and then Plavix alone.  He recommended statin be continued.  LDL was 72.  Current prescribed movement disorder medications: Carbidopa/levodopa 25/100, 2 tablets 3 times per day Pravachol, 80 mg daily  ALLERGIES:  No Known Allergies  CURRENT MEDICATIONS:  Outpatient Encounter Medications as of 10/05/2021  Medication Sig   acetaminophen (TYLENOL) 325 MG tablet Take 650 mg by mouth every 6 (six) hours as needed.   alclomethasone (ACLOVATE) 0.05 % ointment Apply 1 application topically 2 (two) times daily as needed (for rash).   aspirin 325 MG EC tablet Take 1 tablet (325 mg total) by mouth daily.   Bacillus Coagulans-Inulin (PROBIOTIC-PREBIOTIC) 1-250 BILLION-MG CAPS Take 1 capsule by mouth daily.   Bee Pollen 550 MG CAPS Take 550 mg by mouth daily.   carbamazepine (TEGRETOL) 200 MG tablet Take 1 tablet (200 mg total) by mouth 3 (three) times daily.   carbidopa-levodopa (SINEMET IR) 25-100 MG tablet Take 2 tablets by mouth 3 (three) times daily.   cholecalciferol (VITAMIN D) 1000 UNITS tablet Take 2,000 Units by mouth daily.   Coenzyme Q10 (CO Q 10 PO) Take 1 capsule by mouth daily.   desoximetasone (TOPICORT) 0.25 % cream Apply 1 application topically 2 (two) times daily.   ferrous sulfate 324 (65 Fe) MG TBEC Take 324 mg by mouth.   Flaxseed, Linseed, (FLAX SEED OIL) 1300 MG CAPS Take 1 capsule by mouth daily.   FLUoxetine (PROZAC) 10 MG  capsule Take 1 capsule (10 mg total) by mouth daily.   levothyroxine (LEVOTHROID) 125 MCG tablet Take 1 tablet (125 mcg total) by mouth daily before breakfast.   MAGNESIUM PO Take 1 capsule by mouth daily.   Multiple Vitamin (MULITIVITAMIN WITH MINERALS) TABS Take 1 tablet by mouth daily.   Omega-3 Fatty Acids (OMEGA-3 CF PO) Take 1,200 mg by mouth daily.   POTASSIUM GLUCONATE PO Take 1 capsule by mouth daily.   pravastatin (PRAVACHOL) 80 MG tablet Take 1 tablet (80 mg total) by mouth daily.   QUEtiapine (SEROQUEL XR) 50 MG TB24 24 hr tablet  Take 1 tablet (50 mg total) by mouth at bedtime.   tamsulosin (FLOMAX) 0.4 MG CAPS capsule Take 0.4 mg by mouth at bedtime.   TURMERIC CURCUMIN PO Take 1 capsule by mouth daily.   No facility-administered encounter medications on file as of 10/05/2021.    Objective:   PHYSICAL EXAMINATION:    VITALS:   Vitals:   10/05/21 1451  BP: 135/78  Pulse: 67  SpO2: 98%  Weight: 204 lb 9.6 oz (92.8 kg)  Height: 6\' 4"  (1.93 m)       GEN:  The patient appears stated age and is in NAD.  He is cheerful and joking. HEENT:  Normocephalic, atraumatic.  The mucous membranes are moist. The superficial temporal arteries are without ropiness or tenderness. CV:  RRR Lungs:  CTAB Neck/HEME:  There are no carotid bruits bilaterally.  Neurological examination:  Orientation: The patient is alert and oriented x3. Cranial nerves: There is good facial symmetry with minimal facial hypomimia. The speech is fluent and clear. Soft palate rises symmetrically and there is no tongue deviation. Hearing is intact to conversational tone. Sensation: Sensation is intact to light touch throughout Motor: Strength is at least antigravity x4.  Movement examination: Tone: There is nl tone in the UE/LE Abnormal movements: there is no rest tremor today. Coordination:  There is  decremation with RAM's, only with toe taps on the L (same as last visit) Gait and Station: The patient pushes off to arise.  He has his cane with him.  He is just slightly wide-based and just slightly off balance in the turn.  I have reviewed and interpreted the following labs independently    Chemistry      Component Value Date/Time   NA 140 03/30/2021 1255   NA 143 01/16/2019 0838   K 4.4 03/30/2021 1255   CL 107 03/30/2021 1255   CO2 25 03/30/2021 1255   BUN 25 (H) 03/30/2021 1255   BUN 15 01/16/2019 0838   CREATININE 1.30 (H) 03/30/2021 1255   CREATININE 1.18 08/14/2020 1112      Component Value Date/Time   CALCIUM 9.1 03/30/2021  1255   ALKPHOS 62 01/12/2021 1000   AST 16 01/12/2021 1000   ALT 4 01/12/2021 1000   BILITOT 0.6 01/12/2021 1000   BILITOT 0.3 01/16/2019 0838       Lab Results  Component Value Date   WBC 8.2 03/30/2021   HGB 14.5 03/30/2021   HCT 44.0 03/30/2021   MCV 99.1 03/30/2021   PLT 178 03/30/2021    Lab Results  Component Value Date   TSH 2.50 01/12/2021   Lab Results  Component Value Date   CHOL 159 03/15/2022   HDL 75 03/15/2022   LDLCALC 72 03/15/2022   TRIG 59 03/15/2022   CHOLHDL 2.1 03/15/2022     Total time spent on today's visit was *** minutes, including both  face-to-face time and nonface-to-face time.  Time included that spent on review of records (prior notes available to me/labs/imaging if pertinent), discussing treatment and goals, answering patient's questions and coordinating care.   Cc:  [name], FNP

## 2022-04-07 ENCOUNTER — Ambulatory Visit (INDEPENDENT_AMBULATORY_CARE_PROVIDER_SITE_OTHER): Payer: Medicare Other | Admitting: Neurology

## 2022-04-07 ENCOUNTER — Encounter: Payer: Self-pay | Admitting: Neurology

## 2022-04-07 VITALS — BP 120/76 | HR 60 | Ht 76.0 in | Wt 197.6 lb

## 2022-04-07 DIAGNOSIS — G20A1 Parkinson's disease without dyskinesia, without mention of fluctuations: Secondary | ICD-10-CM

## 2022-04-07 DIAGNOSIS — G459 Transient cerebral ischemic attack, unspecified: Secondary | ICD-10-CM | POA: Diagnosis not present

## 2022-04-07 MED ORDER — CARBIDOPA-LEVODOPA 25-100 MG PO TABS: 2.0000 | ORAL_TABLET | Freq: Three times a day (TID) | ORAL | 1 refills | Status: DC

## 2022-04-07 NOTE — Patient Instructions (Signed)
STOP Aspirin START Plavix, 75 mg If you have one sided weakness or numbness next time, you call 911 immediately I sent a referral to [place] at med center [facility] for rehab

## 2022-04-20 NOTE — Progress Notes (Signed)
Remote pacemaker transmission.   

## 2022-04-25 ENCOUNTER — Telehealth: Payer: Self-pay | Admitting: Family

## 2022-04-25 MED ORDER — LEVOTHYROXINE SODIUM 125 MCG PO TABS: 125.0000 ug | ORAL_TABLET | Freq: Every day | ORAL | 3 refills | Status: DC

## 2022-04-25 NOTE — Telephone Encounter (Signed)
Medication: levothyroxine (LEVOTHROID) 125 MCG tablet  Has the patient contacted their pharmacy? No.   Preferred Pharmacy:  [facility]  [address] [place], [place] [address]

## 2022-04-25 NOTE — Addendum Note (Signed)
Addended by: Judieth Keens on: 04/25/2022 11:28 AM   Modules accepted: Orders

## 2022-04-25 NOTE — Telephone Encounter (Signed)
Called pt and left a VM to inform pt Rx has been sent in.

## 2022-04-26 ENCOUNTER — Telehealth: Payer: Self-pay | Admitting: Family

## 2022-04-26 NOTE — Telephone Encounter (Signed)
Pt called to advise that his ENT provider has retired and he wanted to know if Vernona Rieger could recommend an ENT in [facility] for him. Please call to advise.

## 2022-04-26 NOTE — Telephone Encounter (Signed)
Spoke with pt, pt sates his previous ENT is Dr. Ezzard Standing and the reason for the referral is wax build up.

## 2022-04-28 ENCOUNTER — Other Ambulatory Visit: Payer: Self-pay | Admitting: Family

## 2022-04-28 DIAGNOSIS — H6123 Impacted cerumen, bilateral: Secondary | ICD-10-CM

## 2022-05-02 ENCOUNTER — Ambulatory Visit: Payer: Medicare Other | Attending: Neurology | Admitting: Physical Therapy

## 2022-05-06 ENCOUNTER — Ambulatory Visit: Payer: Medicare Other | Admitting: Family

## 2022-05-09 ENCOUNTER — Ambulatory Visit: Payer: Medicare Other | Admitting: Physical Therapy

## 2022-05-12 ENCOUNTER — Encounter: Payer: Medicare Other | Admitting: Physical Therapy

## 2022-05-16 ENCOUNTER — Ambulatory Visit: Payer: Medicare Other | Admitting: Physical Therapy

## 2022-05-19 ENCOUNTER — Ambulatory Visit: Payer: Medicare Other | Admitting: Physical Therapy

## 2022-05-23 ENCOUNTER — Ambulatory Visit (INDEPENDENT_AMBULATORY_CARE_PROVIDER_SITE_OTHER): Payer: Medicare Other | Admitting: *Deleted

## 2022-05-23 DIAGNOSIS — Z0000 Encounter for general adult medical examination without abnormal findings: Secondary | ICD-10-CM

## 2022-05-23 NOTE — Patient Instructions (Signed)
Victor Osborne , Thank you for taking time to come for your Medicare Wellness Visit. I appreciate your ongoing commitment to your health goals. Please review the following plan we discussed and let me know if I can assist you in the future.     This is a list of the screening recommended for you and due dates:  Health Maintenance  Topic Date Due   Complete foot exam   Never done   Eye exam for diabetics  Never done   Zoster (Shingles) Vaccine (1 of 2) Never done   COVID-19 Vaccine (2 - Pfizer risk series) 12/09/2020   DTaP/Tdap/Td vaccine (3 - Td or Tdap) 06/17/2021   Flu Shot  08/18/2022   Hemoglobin A1C  09/13/2022   Medicare Annual Wellness Visit  05/23/2023   Pneumonia Vaccine  Completed   HPV Vaccine  Aged Out    Next appointment: Follow up in one year for your annual wellness visit.   Preventive Care 52 Years and Older, Male Preventive care refers to lifestyle choices and visits with your health care provider that can promote health and wellness. What does preventive care include? A yearly physical exam. This is also called an annual well check. Dental exams once or twice a year. Routine eye exams. Ask your health care provider how often you should have your eyes checked. Personal lifestyle choices, including: Daily care of your teeth and gums. Regular physical activity. Eating a healthy diet. Avoiding tobacco and drug use. Limiting alcohol use. Practicing safe sex. Taking low doses of aspirin every day. Taking vitamin and mineral supplements as recommended by your health care provider. What happens during an annual well check? The services and screenings done by your health care provider during your annual well check will depend on your age, overall health, lifestyle risk factors, and family history of disease. Counseling  Your health care provider may ask you questions about your: Alcohol use. Tobacco use. Drug use. Emotional well-being. Home and relationship  well-being. Sexual activity. Eating habits. History of falls. Memory and ability to understand (cognition). Work and work Astronomer. Screening  You may have the following tests or measurements: Height, weight, and BMI. Blood pressure. Lipid and cholesterol levels. These may be checked every 5 years, or more frequently if you are over 65 years old. Skin check. Lung cancer screening. You may have this screening every year starting at age 87 if you have a 30-pack-year history of smoking and currently smoke or have quit within the past 15 years. Fecal occult blood test (FOBT) of the stool. You may have this test every year starting at age 87. Flexible sigmoidoscopy or colonoscopy. You may have a sigmoidoscopy every 5 years or a colonoscopy every 10 years starting at age 87. Prostate cancer screening. Recommendations will vary depending on your family history and other risks. Hepatitis C blood test. Hepatitis B blood test. Sexually transmitted disease (STD) testing. Diabetes screening. This is done by checking your blood sugar (glucose) after you have not eaten for a while (fasting). You may have this done every 1-3 years. Abdominal aortic aneurysm (AAA) screening. You may need this if you are a current or former smoker. Osteoporosis. You may be screened starting at age 87 if you are at high risk. Talk with your health care provider about your test results, treatment options, and if necessary, the need for more tests. Vaccines  Your health care provider may recommend certain vaccines, such as: Influenza vaccine. This is recommended every year. Tetanus, diphtheria, and  acellular pertussis (Tdap, Td) vaccine. You may need a Td booster every 10 years. Zoster vaccine. You may need this after age 87. Pneumococcal 13-valent conjugate (PCV13) vaccine. One dose is recommended after age 13. Pneumococcal polysaccharide (PPSV23) vaccine. One dose is recommended after age 3. Talk to your health care  provider about which screenings and vaccines you need and how often you need them. This information is not intended to replace advice given to you by your health care provider. Make sure you discuss any questions you have with your health care provider. Document Released: 01/30/2015 Document Revised: 09/23/2015 Document Reviewed: 11/04/2014 Elsevier Interactive Patient Education  2017 [employer] Prevention in the Home Falls can cause injuries. They can happen to people of all ages. There are many things you can do to make your home safe and to help prevent falls. What can I do on the outside of my home? Regularly fix the edges of walkways and driveways and fix any cracks. Remove anything that might make you trip as you walk through a door, such as a raised step or threshold. Trim any bushes or trees on the path to your home. Use bright outdoor lighting. Clear any walking paths of anything that might make someone trip, such as rocks or tools. Regularly check to see if handrails are loose or broken. Make sure that both sides of any steps have handrails. Any raised decks and porches should have guardrails on the edges. Have any leaves, snow, or ice cleared regularly. Use sand or salt on walking paths during winter. Clean up any spills in your garage right away. This includes oil or grease spills. What can I do in the bathroom? Use night lights. Install grab bars by the toilet and in the tub and shower. Do not use towel bars as grab bars. Use non-skid mats or decals in the tub or shower. If you need to sit down in the shower, use a plastic, non-slip stool. Keep the floor dry. Clean up any water that spills on the floor as soon as it happens. Remove soap buildup in the tub or shower regularly. Attach bath mats securely with double-sided non-slip rug tape. Do not have throw rugs and other things on the floor that can make you trip. What can I do in the bedroom? Use night lights. Make  sure that you have a light by your bed that is easy to reach. Do not use any sheets or blankets that are too big for your bed. They should not hang down onto the floor. Have a firm chair that has side arms. You can use this for support while you get dressed. Do not have throw rugs and other things on the floor that can make you trip. What can I do in the kitchen? Clean up any spills right away. Avoid walking on wet floors. Keep items that you use a lot in easy-to-reach places. If you need to reach something above you, use a strong step stool that has a grab bar. Keep electrical cords out of the way. Do not use floor polish or wax that makes floors slippery. If you must use wax, use non-skid floor wax. Do not have throw rugs and other things on the floor that can make you trip. What can I do with my stairs? Do not leave any items on the stairs. Make sure that there are handrails on both sides of the stairs and use them. Fix handrails that are broken or loose. Make sure that  handrails are as long as the stairways. Check any carpeting to make sure that it is firmly attached to the stairs. Fix any carpet that is loose or worn. Avoid having throw rugs at the top or bottom of the stairs. If you do have throw rugs, attach them to the floor with carpet tape. Make sure that you have a light switch at the top of the stairs and the bottom of the stairs. If you do not have them, ask someone to add them for you. What else can I do to help prevent falls? Wear shoes that: Do not have high heels. Have rubber bottoms. Are comfortable and fit you well. Are closed at the toe. Do not wear sandals. If you use a stepladder: Make sure that it is fully opened. Do not climb a closed stepladder. Make sure that both sides of the stepladder are locked into place. Ask someone to hold it for you, if possible. Clearly mark and make sure that you can see: Any grab bars or handrails. First and last steps. Where the  edge of each step is. Use tools that help you move around (mobility aids) if they are needed. These include: Canes. Walkers. Scooters. Crutches. Turn on the lights when you go into a dark area. Replace any light bulbs as soon as they burn out. Set up your furniture so you have a clear path. Avoid moving your furniture around. If any of your floors are uneven, fix them. If there are any pets around you, be aware of where they are. Review your medicines with your doctor. Some medicines can make you feel dizzy. This can increase your chance of falling. Ask your doctor what other things that you can do to help prevent falls. This information is not intended to replace advice given to you by your health care provider. Make sure you discuss any questions you have with your health care provider. Document Released: 10/30/2008 Document Revised: 06/11/2015 Document Reviewed: 02/07/2014 Elsevier Interactive Patient Education  2017 [employer].

## 2022-05-23 NOTE — Progress Notes (Signed)
Subjective:   Victor Osborne is a 89 y.o. male who presents for Medicare Annual/Subsequent preventive examination.  I connected with  Victor Osborne on 05/23/22 by a audio enabled telemedicine application and verified that I am speaking with the correct person using two identifiers.  Patient Location: Home  Provider Location: Office/Clinic  I discussed the limitations of evaluation and management by telemedicine. The patient expressed understanding and agreed to proceed.   Review of Systems     Cardiac Risk Factors include: advanced age (>65men, >61 women);male gender;dyslipidemia;hypertension     Objective:    There were no vitals filed for this visit. There is no height or weight on file to calculate BMI.     05/23/2022    1:44 PM 03/14/2022    8:24 PM 10/14/2021    3:25 PM 10/05/2021    2:51 PM 05/14/2021    2:11 PM 04/13/2021    1:15 PM 04/12/2021    6:23 PM  Advanced Directives  Does Patient Have a Medical Advance Directive? Yes Yes Yes Yes Yes Yes Yes  Type of Estate agent of [place];Living will Healthcare Power of [place];Living will Healthcare Power of [place];Living will  Healthcare Power of [place];Living will Living will Healthcare Power of Attorney  Does patient want to make changes to medical advance directive? No - Patient declined No - Patient declined No - Patient declined    No - Patient declined  Copy of Healthcare Power of Attorney in Chart? No - copy requested No - copy requested No - copy requested  No - copy requested  No - copy requested    Current Medications (verified) Outpatient Encounter Medications as of 05/23/2022  Medication Sig   acetaminophen (TYLENOL) 325 MG tablet Take 650 mg by mouth every 6 (six) hours as needed for mild pain or headache.   alclomethasone (ACLOVATE) 0.05 % ointment Apply 1 application topically 2 (two) times daily as needed (for rash).   carbamazepine (TEGRETOL) 200 MG tablet Take 1 tablet  (200 mg total) by mouth 3 (three) times daily.   carbidopa-levodopa (SINEMET IR) 25-100 MG tablet Take 2 tablets by mouth 3 (three) times daily. 7am/11am/4pm   cholecalciferol (VITAMIN D) 1000 UNITS tablet Take 2,000 Units by mouth daily.   clopidogrel (PLAVIX) 75 MG tablet Take 1 tablet (75 mg total) by mouth daily.   Coenzyme Q10 (CO Q 10 PO) Take 1 capsule by mouth daily.   ferrous sulfate 324 (65 Fe) MG TBEC Take 324 mg by mouth daily.   Flaxseed, Linseed, (FLAX SEED OIL) 1300 MG CAPS Take 1 capsule by mouth daily.   FLUoxetine (PROZAC) 10 MG capsule TAKE 1 CAPSULE EVERY DAY   levothyroxine (LEVOTHROID) 125 MCG tablet Take 1 tablet (125 mcg total) by mouth daily before breakfast.   MAGNESIUM PO Take 1 capsule by mouth daily.   Multiple Vitamin (MULITIVITAMIN WITH MINERALS) TABS Take 1 tablet by mouth daily.   Omega-3 Fatty Acids (OMEGA-3 CF PO) Take 1,200 mg by mouth daily.   POTASSIUM GLUCONATE PO Take 1 capsule by mouth daily.   pravastatin (PRAVACHOL) 80 MG tablet Take 1 tablet (80 mg total) by mouth daily.   QUEtiapine (SEROQUEL XR) 50 MG TB24 24 hr tablet Take 1 tablet (50 mg total) by mouth at bedtime.   tamsulosin (FLOMAX) 0.4 MG CAPS capsule Take 0.4 mg by mouth at bedtime.   No facility-administered encounter medications on file as of 05/23/2022.    Allergies (verified) Patient has no known allergies.  History: Past Medical History:  Diagnosis Date   Bipolar 1 disorder (HCC)    COPD (chronic obstructive pulmonary disease) (HCC)    Depression    First degree AV block    Hypertension    pt denies but was in medical record   Hypoglycemia, unspecified    Myalgia and myositis, unspecified    Obesity    Pneumonia 1970   Skin cancer (melanoma) (HCC)    Thyroid disease    Unspecified hypothyroidism    UPJ obstruction, congenital    Past Surgical History:  Procedure Laterality Date   APPENDECTOMY  1939   malignant melanoma removed from right forehead  2008   PERMANENT  PACEMAKER INSERTION N/A 06/19/2013   Procedure: PERMANENT PACEMAKER INSERTION;  Surgeon: Marinus Maw, MD;  Location: [facility] CATH LAB;  Service: Cardiovascular;  Laterality: N/A;   right big toe  surgery  2009   TRANSURETHRAL RESECTION OF PROSTATE  02/04/2011   Procedure: TRANSURETHRAL RESECTION OF THE PROSTATE (TURP);  Surgeon: Crecencio Mc, MD;  Location: [facility] ORS;  Service: Urology;  Laterality: N/A;   URETHRA SURGERY  2010   reconstruction   Family History  Problem Relation Age of Onset   Cerebral aneurysm Mother    Lung cancer Father    Drug abuse Daughter    Social History   Socioeconomic History   Marital status: Widowed    Spouse name: Not on file   Number of children: Not on file   Years of education: Not on file   Highest education level: Not on file  Occupational History   Occupation: retired  Tobacco Use   Smoking status: Former    Packs/day: 2.00    Years: 40.00    Additional pack years: 0.00    Total pack years: 80.00    Types: Cigarettes   Smokeless tobacco: Never  Vaping Use   Vaping Use: Never used  Substance and Sexual Activity   Alcohol use: No   Drug use: No   Sexual activity: Not Currently  Other Topics Concern   Not on file  Social History Narrative   Work or School: retire Emergency planning/management officer      Home Situation: lives with wife      Spiritual Beliefs: episcopalian      Lifestyle: active with yard work; diet ok   Social Determinants of Health   Financial Resource Strain: Low Risk  (05/14/2021)   Overall Financial Resource Strain (CARDIA)    Difficulty of Paying Living Expenses: Not hard at all  Food Insecurity: No Food Insecurity (03/18/2022)   Hunger Vital Sign    Worried About Running Out of Food in the Last Year: Never true    Ran Out of Food in the Last Year: Never true  Transportation Needs: No Transportation Needs (03/18/2022)   PRAPARE - Administrator, Civil Service (Medical): No    Lack of Transportation (Non-Medical): No  Physical  Activity: Sufficiently Active (05/14/2021)   Exercise Vital Sign    Days of Exercise per Week: 5 days    Minutes of Exercise per Session: 30 min  Stress: No Stress Concern Present (05/14/2021)   [employer] of Occupational Health - Occupational Stress Questionnaire    Feeling of Stress : Not at all  Social Connections: Moderately Integrated (05/14/2021)   Social Connection and Isolation Panel [NHANES]    Frequency of Communication with Friends and Family: More than three times a week    Frequency of Social Gatherings with Friends and  Family: More than three times a week    Attends Religious Services: More than 4 times per year    Active Member of Clubs or Organizations: Yes    Attends Banker Meetings: More than 4 times per year    Marital Status: Widowed    Tobacco Counseling Counseling given: Not Answered   Clinical Intake:  Pre-visit preparation completed: Yes  Pain : No/denies pain  Nutritional Risks: None Diabetes: No  How often do you need to have someone help you when you read instructions, pamphlets, or other written materials from your doctor or pharmacy?: 1 - Never  Activities of Daily Living    05/23/2022    1:47 PM 03/15/2022    1:11 AM  In your present state of health, do you have any difficulty performing the following activities:  Hearing? 1 1  Comment wears hearing aids   Vision? 0 0  Difficulty concentrating or making decisions? 0 0  Walking or climbing stairs? 1 1  Dressing or bathing? 0 0  Doing errands, shopping? 0 0  Preparing Food and eating ? N   Using the Toilet? N   In the past six months, have you accidently leaked urine? Y   Do you have problems with loss of bowel control? N   Managing your Medications? N   Managing your Finances? N   Housekeeping or managing your Housekeeping? N     Patient Care Team: Olive Bass, FNP as PCP - General (Internal Medicine) Marinus Maw, MD as PCP - Electrophysiology  (Cardiology) Tat, Octaviano Batty, DO as Consulting Physician (Neurology)  Indicate any recent Medical Services you may have received from other than [facility] providers in the past year (date may be approximate).     Assessment:   This is a routine wellness examination for [place].  Hearing/Vision screen No results found.  Dietary issues and exercise activities discussed: Current Exercise Habits: Home exercise routine, Type of exercise: walking, Time (Minutes): 30, Frequency (Times/Week): 6, Weekly Exercise (Minutes/Week): 180, Intensity: Mild, Exercise limited by: neurologic condition(s)   Goals Addressed   None    Depression Screen    05/23/2022    1:46 PM 11/16/2021    2:29 PM 11/04/2021   12:58 PM 05/14/2021    2:08 PM 05/07/2021   11:23 AM 04/12/2021    5:52 PM 01/12/2021    9:32 AM  PHQ 2/9 Scores  PHQ - 2 Score 0 0 0 0 0 0 0    Fall Risk    05/23/2022    1:45 PM 04/07/2022    8:38 AM 11/16/2021    2:29 PM 11/04/2021   12:58 PM 10/05/2021    2:51 PM  Fall Risk   Falls in the past year? 0 0 0 0 0  Number falls in past yr: 0 0 0 0   Injury with Fall? 0 0 0 0   Comment   N/A- no falls reported x > 1 year    Risk for fall due to : Impaired balance/gait  History of fall(s);Impaired mobility;Other (Comment) No Fall Risks   Risk for fall due to: Comment   uses assistive devices    Follow up Falls evaluation completed Falls evaluation completed Falls prevention discussed Falls evaluation completed     FALL RISK PREVENTION PERTAINING TO THE HOME:  Any stairs in or around the home? Yes  If so, are there any without handrails? No  Home free of loose throw rugs in walkways, pet beds, electrical  cords, etc? Yes  Adequate lighting in your home to reduce risk of falls? Yes   ASSISTIVE DEVICES UTILIZED TO PREVENT FALLS:  Life alert? Yes  Use of a cane, walker or w/c? Yes  Grab bars in the bathroom? Yes  Shower chair or bench in shower? Yes  Elevated toilet seat or a handicapped  toilet? No   TIMED UP AND GO:  Was the test performed?  No, audio visit .    Cognitive Function:      03/22/2016   12:54 PM  Montreal Cognitive Assessment   Visuospatial/ Executive (0/5) 1  Naming (0/3) 3  Attention: Read list of digits (0/2) 2  Attention: Read list of letters (0/1) 1  Attention: Serial 7 subtraction starting at 100 (0/3) 1  Language: Repeat phrase (0/2) 2  Language : Fluency (0/1) 1  Abstraction (0/2) 2  Delayed Recall (0/5) 4  Orientation (0/6) 5  Total 22  Adjusted Score (based on education) 22      05/23/2022    1:51 PM 05/14/2021    2:14 PM 09/08/2016    3:41 PM  6CIT Screen  What Year? 0 points 0 points 0 points  What month? 0 points 0 points 0 points  What time? 0 points 0 points 0 points  Count back from 20 0 points 0 points 0 points  Months in reverse 0 points 2 points 0 points  Repeat phrase 0 points 0 points 0 points  Total Score 0 points 2 points 0 points    Immunizations Immunization History  Administered Date(s) Administered   Fluad Quad(high Dose 65+) 07/19/2018, 09/28/2020, 10/20/2021   Influenza Split 10/18/2010, 10/18/2011, 10/17/2012, 10/04/2013   Influenza, High Dose Seasonal PF 10/01/2013, 10/01/2015, 09/08/2016   Influenza,inj,Quad PF,6+ Mos 10/18/2014   Influenza-Unspecified 10/18/2014   PPD Test 10/11/2016   Pfizer Covid-19 Vaccine Bivalent Booster 47yrs & up 11/18/2020   Pneumococcal Conjugate-13 09/08/2016   Pneumococcal Polysaccharide-23 10/28/2011   Tdap 09/09/2010, 06/18/2011    TDAP status: Due, Education has been provided regarding the importance of this vaccine. Advised may receive this vaccine at local pharmacy or Health Dept. Aware to provide a copy of the vaccination record if obtained from local pharmacy or Health Dept. Verbalized acceptance and understanding.  Flu Vaccine status: Up to date  Pneumococcal vaccine status: Up to date  Covid-19 vaccine status: Information provided on how to obtain vaccines.    Qualifies for Shingles Vaccine? Yes   Zostavax completed No   Shingrix Completed?: No.    Education has been provided regarding the importance of this vaccine. Patient has been advised to call insurance company to determine out of pocket expense if they have not yet received this vaccine. Advised may also receive vaccine at local pharmacy or Health Dept. Verbalized acceptance and understanding.  Screening Tests Health Maintenance  Topic Date Due   FOOT EXAM  Never done   OPHTHALMOLOGY EXAM  Never done   Zoster Vaccines- Shingrix (1 of 2) Never done   COVID-19 Vaccine (2 - Pfizer risk series) 12/09/2020   DTaP/Tdap/Td (3 - Td or Tdap) 06/17/2021   Medicare Annual Wellness (AWV)  05/15/2022   INFLUENZA VACCINE  08/18/2022   HEMOGLOBIN A1C  09/13/2022   Pneumonia Vaccine 81+ Years old  Completed   HPV VACCINES  Aged Out    Health Maintenance  Health Maintenance Due  Topic Date Due   FOOT EXAM  Never done   OPHTHALMOLOGY EXAM  Never done   Zoster Vaccines- Shingrix (1 of  2) Never done   COVID-19 Vaccine (2 - Pfizer risk series) 12/09/2020   DTaP/Tdap/Td (3 - Td or Tdap) 06/17/2021   Medicare Annual Wellness (AWV)  05/15/2022    Colorectal cancer screening: No longer required.   Lung Cancer Screening: (Low Dose CT Chest recommended if Age 20-80 years, 30 pack-year currently smoking OR have quit w/in 15years.) does not qualify.   Additional Screening:  Hepatitis C Screening: does not qualify  Vision Screening: Recommended annual ophthalmology exams for early detection of glaucoma and other disorders of the eye. Is the patient up to date with their annual eye exam?  Yes  Who is the provider or what is the name of the office in which the patient attends annual eye exams? Dr. Emily Filbert If pt is not established with a provider, would they like to be referred to a provider to establish care? No .   Dental Screening: Recommended annual dental exams for proper oral hygiene  Community  Resource Referral / Chronic Care Management: CRR required this visit?  No   CCM required this visit?  No      Plan:     I have personally reviewed and noted the following in the patient's chart:   Medical and social history Use of alcohol, tobacco or illicit drugs  Current medications and supplements including opioid prescriptions. Patient is not currently taking opioid prescriptions. Functional ability and status Nutritional status Physical activity Advanced directives List of other physicians Hospitalizations, surgeries, and ER visits in previous 12 months Vitals Screenings to include cognitive, depression, and falls Referrals and appointments  In addition, I have reviewed and discussed with patient certain preventive protocols, quality metrics, and best practice recommendations. A written personalized care plan for preventive services as well as general preventive health recommendations were provided to patient.   Due to this being a telephonic visit, the after visit summary with patients personalized plan was offered to patient via mail or my-chart. Patient would like to access on my-chart.   Donne Anon, [place]   05/23/2022   Nurse Notes: None

## 2022-05-24 ENCOUNTER — Ambulatory Visit: Payer: Medicare Other | Admitting: Physical Therapy

## 2022-05-26 ENCOUNTER — Ambulatory Visit: Payer: Medicare Other | Admitting: Physical Therapy

## 2022-05-30 ENCOUNTER — Ambulatory Visit: Payer: Medicare Other | Admitting: Physical Therapy

## 2022-06-01 NOTE — Therapy (Signed)
OUTPATIENT PHYSICAL THERAPY PARKINSON'S EVALUATION   Patient Name: Victor Osborne MRN: [id number] DOB:Jan 25, 1932, 89 y.o., male Today's Date: 06/07/2022   END OF SESSION:  PT End of Session - 06/07/22 1312     Visit Number 1    Date for PT Re-Evaluation 07/19/22    Authorization Type Medicare & [employer]    PT Start Time 1312    PT Stop Time 1403    PT Time Calculation (min) 51 min    Activity Tolerance Patient tolerated treatment well    Behavior During Therapy WFL for tasks assessed/performed             Past Medical History:  Diagnosis Date   Bipolar 1 disorder (HCC)    COPD (chronic obstructive pulmonary disease) (HCC)    Depression    First degree AV block    Hypertension    pt denies but was in medical record   Hypoglycemia, unspecified    Myalgia and myositis, unspecified    Obesity    Pneumonia 1970   Skin cancer (melanoma) (HCC)    Thyroid disease    Unspecified hypothyroidism    UPJ obstruction, congenital    Past Surgical History:  Procedure Laterality Date   APPENDECTOMY  1939   malignant melanoma removed from right forehead  2008   PERMANENT PACEMAKER INSERTION N/A 06/19/2013   Procedure: PERMANENT PACEMAKER INSERTION;  Surgeon: Marinus Maw, MD;  Location: [facility] CATH LAB;  Service: Cardiovascular;  Laterality: N/A;   right big toe  surgery  2009   TRANSURETHRAL RESECTION OF PROSTATE  02/04/2011   Procedure: TRANSURETHRAL RESECTION OF THE PROSTATE (TURP);  Surgeon: Crecencio Mc, MD;  Location: [facility] ORS;  Service: Urology;  Laterality: N/A;   URETHRA SURGERY  2010   reconstruction   Patient Active Problem List   Diagnosis Date Noted   Hyperlipidemia 03/15/2022   TIA (transient ischemic attack) 03/14/2022   History of neoplasm 04/12/2021   Melanocytic nevi of trunk 04/12/2021   Other seborrheic keratosis 04/12/2021   Cerebral embolism with cerebral infarction 03/31/2021   Left leg weakness 03/30/2021   Chronic post-traumatic stress  disorder 12/05/2017   Bipolar I disorder, current or most recent episode depressed, in partial remission (HCC) 12/05/2017   CHB (complete heart block) (HCC) 01/27/2017   HTN (hypertension) 01/27/2017   Hypothyroidism 10/11/2016   Parkinson disease 09/08/2016   Chronic osteoarthritis 03/20/2015   Bipolar 1 disorder (HCC) 03/20/2015   Tremor 03/20/2015   Pacemaker 09/24/2013   SSS (sick sinus syndrome) (HCC) 06/19/2013   First degree AV block 04/09/2013   Allergic rhinitis 03/17/2013   Obstructive sleep apnea 12/18/2006   COPD mixed type (HCC) 12/18/2006   History of malignant melanoma of skin 12/18/2006   Diabetes mellitus (HCC) 12/18/2006    PCP: Olive Bass, FNP   REFERRING PROVIDER: Vladimir Faster, DO   REFERRING DIAG:  G45.9 (ICD-10-CM) - TIA (transient ischemic attack)  G20.A1 (ICD-10-CM) - Parkinson's disease without dyskinesia or fluctuating manifestations   THERAPY DIAG:  Other abnormalities of gait and mobility  Unsteadiness on feet  Muscle weakness (generalized)  Abnormal posture  RATIONALE FOR EVALUATION AND TREATMENT: Rehabilitation  ONSET DATE: 03/14/22 - TIA; Initial PD diagnosis >15 yrs ago  NEXT MD VISIT: 10/26/22   SUBJECTIVE:  SUBJECTIVE STATEMENT: Pt reports he feels like he is getting weaker but does not feel that it is coming from the TIA. In particular, he notes more issues with fine motor skills and feels like his balance is now "medium rare".  At the time of the TIA, he felt like his R LE gave way causing him to fall against the elevator wall but he did not hit the ground. He does not feel as confident as he used to. He is using his rollator more commonly but still uses his cane at times going to/from his car.  PAIN: Are you having pain?  No  PERTINENT HISTORY:  PPM 2 sick sinus syndrome/SA node dysfunction & complete heart block, orthostatic hypotension, COPD, OSA, HTN, DM2, Parkinson's Disease, CVA 03/2021, hypothyroidism, PTSD, bipolar I disorder   PRECAUTIONS: Fall and ICD/Pacemaker  WEIGHT BEARING RESTRICTIONS: No  FALLS:  Has patient fallen in last 6 months? No  LIVING ENVIRONMENT: Lives with: lives alone and with his dog Lives in: Other Independent living facility (3rd floor) Stairs:  Elevator Has following equipment at home: Single point cane, Environmental consultant - 4 wheeled, and hiking pole  OCCUPATION: Retired  PLOF: Independent and Leisure: being outdoors - walking his dog, and stays active most of the day  PATIENT GOALS: "Improve my hand-eye coordination and especially balance."   OBJECTIVE: (objective measures completed at initial evaluation unless otherwise dated)  DIAGNOSTIC FINDINGS:  03/14/22 & 03/15/22 - CT Head w/o contrast:  IMPRESSION: 1. No evidence of acute intracranial abnormality. 2. Chronic small vessel ischemic disease and cerebral atrophy. 03/14/22 - CT Angio Head & Neck: IMPRESSION: 1. Negative CTA for large vessel occlusion or other emergent finding. 2. Mild for age atheromatous change about the carotid bifurcations and carotid siphons without hemodynamically significant stenosis.  COGNITION: Overall cognitive status: Within functional limits for tasks assessed   SENSATION: WFL Except neuropathy in his feet - most notable at night  COORDINATION: Gross motor WFL B UE/LE  MUSCLE LENGTH: Hamstrings: mod tight B ITB: mod tight L, mild tight R Piriformis: mild tight B Hip flexors: mod tight B Quads: mod tight B Heelcord: mod tight L, mild tight R  POSTURE:  rounded shoulders, forward head, and flexed trunk   LOWER EXTREMITY ROM:    Grossly WFL  LOWER EXTREMITY MMT:    MMT Right Eval Left Eval  Hip flexion 4 4  Hip extension 4 4  Hip abduction 4+ 4+  Hip adduction 4+ 4+   Hip internal rotation 4+ 4+  Hip external rotation 4 4  Knee flexion 5 5  Knee extension 5 5  Ankle dorsiflexion 4+ 4+  Ankle plantarflexion    Ankle inversion    Ankle eversion    (Blank rows = not tested)  BED MOBILITY:  Sit to supine Complete Independence Supine to sit Complete Independence Rolling to Right Complete Independence  TRANSFERS: Assistive device utilized: None  Sit to stand: Complete Independence Stand to sit: Complete Independence Chair to chair: Modified independence Floor:  NT  GAIT: Distance walked: 100 ft Assistive device utilized: Environmental consultant - 4 wheeled and None Level of assistance: Modified independence and SBA Gait pattern: step through pattern, decreased arm swing- Right, decreased arm swing- Left, and trunk flexed Comments: more pronounced fwd head, flexed trunk posture noted than on previous PT episodes  RAMP: Level of Assistance:  NT Assistive device utilized:  NT Ramp Comments:   CURB:  Level of Assistance:  NT Assistive device utilized:  NT Curb Comments:  STAIRS:  Level of Assistance: SBA  Stair Negotiation Technique: Alternating Pattern  with Single Rail on Right  Number of Stairs: 14   Height of Stairs: 7"  Comments: Pt ascends stairs with weight mostly through ball of foot on step with heel hanging over edge of step causing slight drop and posterior shift at times, but no LOB due to grip on railing  FUNCTIONAL TESTS:  5 times sit to stand: 15.96 sec Timed up and go (TUG): Normal = 12.56 sec with rollator, 9.09 sec w/o AD; Manual = 9.32 sec; Cognitive = 14.34 sec 10 meter walk test: 7.97 sec with rollator, 7.59 sec w/o AD; Gait speed = 4.12 ft/sec with rollator, 4.32 ft/sec w/o AD,  Berg Balance Scale: TBA next visit Functional gait assessment: 17/30; < 19 = high risk fall  Scores as of discharge from last PT episode - December 2023: 5 times sit to stand: 12.13 sec Timed up and go (TUG): Normal = 10.45 sec; Manual = 11.45 sec;  Cognitive = 15.15 sec 10 meter walk test: 7.5 sec (no AD); Gait speed = 4.37 ft/sec Berg: 53/56; 52-55 lower (> 25%) fall risk Functional gait assessment: 27/30; 25-28 = low risk fall   PATIENT SURVEYS:  ABC scale 1240 / 1600 = 77.5 %   TODAY'S TREATMENT:   06/07/22 Eval only  PATIENT EDUCATION:  Education details: PT eval findings and anticipated POC  Person educated: Patient Education method: Explanation Education comprehension: verbalized understanding  HOME EXERCISE PROGRAM: TBD   ASSESSMENT:  CLINICAL IMPRESSION: Bracy Mcclelland is a 89 y.o. male who was seen today for physical therapy evaluation and treatment for a recent TIA and ongoing Parkinson's disease. He was first diagnosed 16+ years ago and has completed prior PT episodes in 2018, 2022 and 2023 within the [facility] system. Since his last PT episode he reports he was hospitalized for a  TIA with R-sided weakness in late February. Since then, he feels that his strength has been declining causing him to become more dependent on his rollator where he had previously been ambulatory only intermittently with a cane or walking stick. He presents with decreased timing and coordination of gait, decreased balance, abnormal posture, bradykinesia with transfers, LE weakness and postural instability. FGA score of 17/30 indicates a high risk for falls and demonstrates a notable decline from his score of 27/30 as of his last discharge from PT. His 5xSTS has also demonstrated a decline from his prior level, although the TUG and gait speed assessments are essentially unchanged Sharlene Motts testing pending next visit). He does not recall any falls in the past 6 months and states he only leaned into the elevator walls when his leg "gave way" at the time of his TIA. He has lessened/slowed outdoor walking due to balance issues. Donovann Orrick" will benefit from skilled PT to address above deficits affecting mobility and gait instability/balance  to allow for improved QOL and functional mobility/gait with reduced fall risk.     OBJECTIVE IMPAIRMENTS: Abnormal gait, decreased activity tolerance, decreased balance, decreased coordination, decreased endurance, decreased mobility, difficulty walking, decreased strength, decreased safety awareness, impaired perceived functional ability, impaired flexibility, improper body mechanics, and postural dysfunction.   ACTIVITY LIMITATIONS: carrying, lifting, standing, squatting, stairs, transfers, bed mobility, reach over head, locomotion level, and caring for others  PARTICIPATION LIMITATIONS: meal prep, cleaning, laundry, driving, shopping, and community activity  PERSONAL FACTORS: Age, Fitness, Past/current experiences, Time since onset of injury/illness/exacerbation, and 3+ comorbidities: PPM 2 sick sinus syndrome/SA  node dysfunction & complete heart block, orthostatic hypotension, COPD, OSA, HTN, DM2, Parkinson's Disease, CVA 03/2021, hypothyroidism, PTSD, bipolar I disorder   are also affecting patient's functional outcome.   REHAB POTENTIAL: Good  CLINICAL DECISION MAKING: Evolving/moderate complexity  EVALUATION COMPLEXITY: Moderate   GOALS: Goals reviewed with patient? Yes  SHORT TERM GOALS: Target date: 06/28/2022  Patient will be independent with initial HEP. Baseline:  Goal status: INITIAL  2.  Patient will improve 5x STS time to </= 14 seconds to demonstrate improved functional strength and transfer efficiency.. Baseline: 15.96 sec Goal status: INITIAL  3.  Patient will be educated on strategies to decrease risk of falls.  Baseline:  Goal status: INITIAL  LONG TERM GOALS: Target date: 07/19/2022  Patient will be independent with ongoing/advanced HEP for self-management at home incorporating PWR! Moves as indicated .  Baseline:  Goal status: INITIAL  2.  Patient will be able to ambulate at least 800 ft with LRAD including outdoor and unlevel surfaces, independently with no  LOB, for improved community gait. .  Baseline:  Goal status: INITIAL  3.  Patient will be able to step up/down curb safely with LRAD for safety with community ambulation.  Baseline:  Goal status: INITIAL   4.  Patient will demonstrate at least 25/30 on FGA to improve gait stability and reduce risk for falls. (MCID = 4 points) Baseline: 17/30 Goal status: INITIAL  5.  Patient will improve Berg score to >/= 52/56 to improve safety and stability with ADLs in standing and reduce risk for falls. (MCID = 8 points)   Baseline: TBD Goal status: INITIAL  6.  Patient will report >/= 85% on ABC scale to demonstrate improved balance confidence and decreased risk for falls. Baseline: 1240 / 1600 = 77.5 % Goal status: INITIAL  7. Patient will verbalize understanding of local Parkinson's disease community resources, including community fitness post d/c. Baseline:  Goal status: INITIAL   PLAN:  PT FREQUENCY: 2x/week  PT DURATION: 6 weeks  PLANNED INTERVENTIONS: Therapeutic exercises, Therapeutic activity, Neuromuscular re-education, Balance training, Gait training, Patient/Family education, Self Care, Joint mobilization, Stair training, DME instructions, Manual therapy, and Re-evaluation  PLAN FOR NEXT SESSION: Complete Berg assessment; create initial HEP    Marry Guan, PT 06/07/2022, 6:28 PM

## 2022-06-02 ENCOUNTER — Ambulatory Visit: Payer: Medicare Other | Admitting: Physical Therapy

## 2022-06-06 ENCOUNTER — Ambulatory Visit: Payer: Medicare Other

## 2022-06-07 ENCOUNTER — Other Ambulatory Visit: Payer: Self-pay

## 2022-06-07 ENCOUNTER — Encounter: Payer: Self-pay | Admitting: Physical Therapy

## 2022-06-07 ENCOUNTER — Ambulatory Visit: Payer: Medicare Other | Attending: Neurology | Admitting: Physical Therapy

## 2022-06-07 DIAGNOSIS — R2681 Unsteadiness on feet: Secondary | ICD-10-CM | POA: Insufficient documentation

## 2022-06-07 DIAGNOSIS — G20A1 Parkinson's disease without dyskinesia, without mention of fluctuations: Secondary | ICD-10-CM | POA: Insufficient documentation

## 2022-06-07 DIAGNOSIS — R293 Abnormal posture: Secondary | ICD-10-CM | POA: Diagnosis present

## 2022-06-07 DIAGNOSIS — R2689 Other abnormalities of gait and mobility: Secondary | ICD-10-CM | POA: Insufficient documentation

## 2022-06-07 DIAGNOSIS — G459 Transient cerebral ischemic attack, unspecified: Secondary | ICD-10-CM | POA: Diagnosis not present

## 2022-06-07 DIAGNOSIS — M6281 Muscle weakness (generalized): Secondary | ICD-10-CM | POA: Insufficient documentation

## 2022-06-09 ENCOUNTER — Encounter: Payer: Self-pay | Admitting: Physical Therapy

## 2022-06-09 ENCOUNTER — Ambulatory Visit: Payer: Medicare Other | Admitting: Physical Therapy

## 2022-06-09 DIAGNOSIS — R2681 Unsteadiness on feet: Secondary | ICD-10-CM

## 2022-06-09 DIAGNOSIS — R293 Abnormal posture: Secondary | ICD-10-CM

## 2022-06-09 DIAGNOSIS — R2689 Other abnormalities of gait and mobility: Secondary | ICD-10-CM | POA: Diagnosis not present

## 2022-06-09 DIAGNOSIS — M6281 Muscle weakness (generalized): Secondary | ICD-10-CM

## 2022-06-09 NOTE — Therapy (Addendum)
OUTPATIENT PHYSICAL THERAPY TREATMENT   Patient Name: Cleophas Verderame MRN: [id number] DOB:[birth year], 89 y.o., male Today's Date: 06/09/2022   END OF SESSION:  PT End of Session - 06/09/22 1351     Visit Number 2    Date for PT Re-Evaluation 07/19/22    Authorization Type Medicare & [employer]    PT Start Time 1351    PT Stop Time 1442    PT Time Calculation (min) 51 min    Activity Tolerance Patient tolerated treatment well    Behavior During Therapy WFL for tasks assessed/performed              Past Medical History:  Diagnosis Date   Bipolar 1 disorder (HCC)    COPD (chronic obstructive pulmonary disease) (HCC)    Depression    First degree AV block    Hypertension    pt denies but was in medical record   Hypoglycemia, unspecified    Myalgia and myositis, unspecified    Obesity    Pneumonia 1970   Skin cancer (melanoma) (HCC)    Thyroid disease    Unspecified hypothyroidism    UPJ obstruction, congenital    Past Surgical History:  Procedure Laterality Date   APPENDECTOMY  1939   malignant melanoma removed from right forehead  2008   PERMANENT PACEMAKER INSERTION N/A 06/19/2013   Procedure: PERMANENT PACEMAKER INSERTION;  Surgeon: Marinus Maw, MD;  Location: [facility] CATH LAB;  Service: Cardiovascular;  Laterality: N/A;   right big toe  surgery  2009   TRANSURETHRAL RESECTION OF PROSTATE  02/04/2011   Procedure: TRANSURETHRAL RESECTION OF THE PROSTATE (TURP);  Surgeon: Crecencio Mc, MD;  Location: [facility] ORS;  Service: Urology;  Laterality: N/A;   URETHRA SURGERY  2010   reconstruction   Patient Active Problem List   Diagnosis Date Noted   Hyperlipidemia 03/15/2022   TIA (transient ischemic attack) 03/14/2022   History of neoplasm 04/12/2021   Melanocytic nevi of trunk 04/12/2021   Other seborrheic keratosis 04/12/2021   Cerebral embolism with cerebral infarction 03/31/2021   Left leg weakness 03/30/2021   Chronic post-traumatic stress disorder  12/05/2017   Bipolar I disorder, current or most recent episode depressed, in partial remission (HCC) 12/05/2017   CHB (complete heart block) (HCC) 01/27/2017   HTN (hypertension) 01/27/2017   Hypothyroidism 10/11/2016   Parkinson disease 09/08/2016   Chronic osteoarthritis 03/20/2015   Bipolar 1 disorder (HCC) 03/20/2015   Tremor 03/20/2015   Pacemaker 09/24/2013   SSS (sick sinus syndrome) (HCC) 06/19/2013   First degree AV block 04/09/2013   Allergic rhinitis 03/17/2013   Obstructive sleep apnea 12/18/2006   COPD mixed type (HCC) 12/18/2006   History of malignant melanoma of skin 12/18/2006   Diabetes mellitus (HCC) 12/18/2006    PCP: Olive Bass, FNP   REFERRING PROVIDER: Vladimir Faster, DO   REFERRING DIAG:  G45.9 (ICD-10-CM) - TIA (transient ischemic attack)  G20.A1 (ICD-10-CM) - Parkinson's disease without dyskinesia or fluctuating manifestations   THERAPY DIAG:  Other abnormalities of gait and mobility  Unsteadiness on feet  Muscle weakness (generalized)  Abnormal posture  RATIONALE FOR EVALUATION AND TREATMENT: Rehabilitation  ONSET DATE: 03/14/22 - TIA; Initial PD diagnosis >15 yrs ago  NEXT MD VISIT: 10/26/22   SUBJECTIVE:  SUBJECTIVE STATEMENT: Pt reports his knees give 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  PAIN: Are you having pain? No  PERTINENT HISTORY:  PPM 2 sick sinus syndrome/SA node dysfunction & complete heart block, orthostatic hypotension, COPD, OSA, HTN, DM2, Parkinson's Disease, CVA 03/2021, hypothyroidism, PTSD, bipolar I disorder   PRECAUTIONS: Fall and ICD/Pacemaker  WEIGHT BEARING RESTRICTIONS: No  FALLS:  Has patient fallen in last 6 months? No  LIVING ENVIRONMENT: Lives with: lives alone and with his  dog Lives in: Other Independent living facility (3rd floor) Stairs:  Elevator Has following equipment at home: Single point cane, Environmental consultant - 4 wheeled, and hiking pole  OCCUPATION: Retired  PLOF: Independent and Leisure: being outdoors - walking his dog, and stays active most of the day  PATIENT GOALS: "Improve my hand-eye coordination and especially balance."   OBJECTIVE: (objective measures completed at initial evaluation unless otherwise dated)  DIAGNOSTIC FINDINGS:  03/14/22 & 03/15/22 - CT Head w/o contrast:  IMPRESSION: 1. No evidence of acute intracranial abnormality. 2. Chronic small vessel ischemic disease and cerebral atrophy. 03/14/22 - CT Angio Head & Neck: IMPRESSION: 1. Negative CTA for large vessel occlusion or other emergent finding. 2. Mild for age atheromatous change about the carotid bifurcations and carotid siphons without hemodynamically significant stenosis.  COGNITION: Overall cognitive status: Within functional limits for tasks assessed   SENSATION: WFL Except neuropathy in his feet - most notable at night  COORDINATION: Gross motor WFL B UE/LE  MUSCLE LENGTH: Hamstrings: mod tight B ITB: mod tight L, mild tight R Piriformis: mild tight B Hip flexors: mod tight B Quads: mod tight B Heelcord: mod tight L, mild tight R  POSTURE:  rounded shoulders, forward head, and flexed trunk   LOWER EXTREMITY ROM:    Grossly WFL  LOWER EXTREMITY MMT:    MMT Right Eval Left Eval  Hip flexion 4 4  Hip extension 4 4  Hip abduction 4+ 4+  Hip adduction 4+ 4+  Hip internal rotation 4+ 4+  Hip external rotation 4 4  Knee flexion 5 5  Knee extension 5 5  Ankle dorsiflexion 4+ 4+  Ankle plantarflexion    Ankle inversion    Ankle eversion    (Blank rows = not tested)  BED MOBILITY:  Sit to supine Complete Independence Supine to sit Complete Independence Rolling to Right Complete Independence  TRANSFERS: Assistive device utilized: None  Sit to  stand: Complete Independence Stand to sit: Complete Independence Chair to chair: Modified independence Floor:  NT  GAIT: Distance walked: 100 ft Assistive device utilized: Environmental consultant - 4 wheeled and None Level of assistance: Modified independence and SBA Gait pattern: step through pattern, decreased arm swing- Right, decreased arm swing- Left, and trunk flexed Comments: more pronounced fwd head, flexed trunk posture noted than on previous PT episodes  RAMP: Level of Assistance:  NT Assistive device utilized:  NT Ramp Comments:   CURB:  Level of Assistance:  NT Assistive device utilized:  NT Curb Comments:   STAIRS:  Level of Assistance: SBA  Stair Negotiation Technique: Alternating Pattern  with Single Rail on Right  Number of Stairs: 14   Height of Stairs: 7"  Comments: Pt ascends stairs with weight mostly through ball of foot on step with heel hanging over edge of step causing slight drop and posterior shift at times, but no LOB due to grip on railing  FUNCTIONAL TESTS:  5 times sit to stand: 15.96 sec Timed up and go (TUG): Normal =  12.56 sec with rollator, 9.09 sec w/o AD; Manual = 9.32 sec; Cognitive = 14.34 sec 10 meter walk test: 7.97 sec with rollator, 7.59 sec w/o AD; Gait speed = 4.12 ft/sec with rollator, 4.32 ft/sec w/o AD,  Berg Balance Scale: 43/56; 37-45 = significant risk for falls (>80%)  (06/09/22) Functional gait assessment: 17/30; < 19 = high risk fall  Scores as of discharge from last PT episode - December 2023: 5 times sit to stand: 12.13 sec Timed up and go (TUG): Normal = 10.45 sec; Manual = 11.45 sec; Cognitive = 15.15 sec 10 meter walk test: 7.5 sec (no AD); Gait speed = 4.37 ft/sec Berg: 53/56; 52-55 lower (> 25%) fall risk Functional gait assessment: 27/30; 25-28 = low risk fall   PATIENT SURVEYS:  ABC scale 1240 / 1600 = 77.5 %   TODAY'S TREATMENT:   06/09/22 THERAPEUTIC ACTIVITIES: Berg: 43/56; 37-45 = significant risk for falls (>80%)    THERAPEUTIC EXERCISE: to improve flexibility, strength and mobility.  Verbal and tactile cues throughout for technique. NuStep - L5 x 5 min Seated hip hinge HS stretch x 30" bil Seated hip hinge figure-4 piriformis stretch x 30" bil Standing fwd lunge hip flexor stretch with UE support on wall x 30" bil Standing lateral lean ITB stretch with UE support on counter x 30" bil Standing RTB march with looped RTB at midfeet and UE support on back of chair x 10 Standing alt hip extension with looped RTB at ankles and UE support on back of chair x 10   06/07/22 Eval only  PATIENT EDUCATION:  Education details: initial HEP  Person educated: Patient Education method: Programmer, multimedia, Facilities manager, Verbal cues, and Handouts Education comprehension: verbalized understanding, returned demonstration, verbal cues required, and needs further education  HOME EXERCISE PROGRAM: Access Code: [email or web address] URL: [email or web address] Date: 06/09/2022 Prepared by: Glenetta Hew  Exercises - Seated Hamstring Stretch  - 2 x daily - 7 x weekly - 3 reps - 30 sec hold - Seated Figure 4 Piriformis Stretch  - 2 x daily - 7 x weekly - 3 reps - 30 sec hold - Standing Hip Flexor Stretch  - 2 x daily - 7 x weekly - 3 reps - 30 sec hold - Standing ITB Stretch  - 2 x daily - 7 x weekly - 3 reps - 30 sec hold - Marching with Resistance  - 1 x daily - 7 x weekly - 2 sets - 10 reps - 3 sec hold - Standing Hip Extension with Resistance at Ankles and Counter Support  - 1 x daily - 7 x weekly - 2 sets - 10 reps - 3 sec hold  Access Code: [email or web address] from previous episode   ASSESSMENT:  CLINICAL IMPRESSION: Berg balance testing completed with score decreased to 43/56 from 53/56 at completion of last episode, indicating a significant risk for falls (>80%). Remainder of session focusing in initiation of HEP targeting proximal LE flexibility and hip strengthening. Dmarius Kehl" able to provide good return  demostration for all exercises but uncertain if he is getting the appropriate stretch with the ITB stretch. HEP handouts provided and will review next visit to address any issues with home performance.   OBJECTIVE IMPAIRMENTS: Abnormal gait, decreased activity tolerance, decreased balance, decreased coordination, decreased endurance, decreased mobility, difficulty walking, decreased strength, decreased safety awareness, impaired perceived functional ability, impaired flexibility, improper body mechanics, and postural dysfunction.   ACTIVITY LIMITATIONS: carrying, lifting, standing, squatting, stairs, transfers, bed mobility, reach over head, locomotion level, and  caring for others  PARTICIPATION LIMITATIONS: meal prep, cleaning, laundry, driving, shopping, and community activity  PERSONAL FACTORS: Age, Fitness, Past/current experiences, Time since onset of injury/illness/exacerbation, and 3+ comorbidities: PPM 2 sick sinus syndrome/SA node dysfunction & complete heart block, orthostatic hypotension, COPD, OSA, HTN, DM2, Parkinson's Disease, CVA 03/2021, hypothyroidism, PTSD, bipolar I disorder   are also affecting patient's functional outcome.   REHAB POTENTIAL: Good  CLINICAL DECISION MAKING: Evolving/moderate complexity  EVALUATION COMPLEXITY: Moderate   GOALS: Goals reviewed with patient? Yes  SHORT TERM GOALS: Target date: 06/28/2022  Patient will be independent with initial HEP. Baseline:  Goal status: IN PROGRESS  2.  Patient will improve 5x STS time to </= 14 seconds to demonstrate improved functional strength and transfer efficiency.. Baseline: 15.96 sec Goal status: IN PROGRESS  3.  Patient will be educated on strategies to decrease risk of falls.  Baseline:  Goal status: IN PROGRESS  LONG TERM GOALS: Target date: 07/19/2022  Patient will be independent with ongoing/advanced HEP for self-management at home incorporating PWR! Moves as indicated .  Baseline:  Goal status: IN  PROGRESS  2.  Patient will be able to ambulate at least 800 ft with LRAD including outdoor and unlevel surfaces, independently with no LOB, for improved community gait. .  Baseline:  Goal status: IN PROGRESS  3.  Patient will be able to step up/down curb safely with LRAD for safety with community ambulation.  Baseline:  Goal status: IN PROGRESS   4.  Patient will demonstrate at least 25/30 on FGA to improve gait stability and reduce risk for falls. (MCID = 4 points) Baseline: 17/30 Goal status: IN PROGRESS  5.  Patient will improve Berg score to >/= 52/56 to improve safety and stability with ADLs in standing and reduce risk for falls. (MCID = 8 points)   Baseline: TBD Goal status: IN PROGRESS  6.  Patient will report >/= 85% on ABC scale to demonstrate improved balance confidence and decreased risk for falls. Baseline: 1240 / 1600 = 77.5 % Goal status: IN PROGRESS  7. Patient will verbalize understanding of local Parkinson's disease community resources, including community fitness post d/c. Baseline:  Goal status: IN PROGRESS   PLAN:  PT FREQUENCY: 2x/week  PT DURATION: 6 weeks  PLANNED INTERVENTIONS: Therapeutic exercises, Therapeutic activity, Neuromuscular re-education, Balance training, Gait training, Patient/Family education, Self Care, Joint mobilization, Stair training, DME instructions, Manual therapy, and Re-evaluation  PLAN FOR NEXT SESSION: review initial HEP; progress lumbopelvic/LE flexibility and strengthening; initiate PWR! Moves   [place], [place] 06/09/2022, 3:08 PM

## 2022-06-14 ENCOUNTER — Encounter: Payer: Self-pay | Admitting: Physical Therapy

## 2022-06-14 ENCOUNTER — Ambulatory Visit: Payer: Medicare Other | Admitting: Physical Therapy

## 2022-06-14 ENCOUNTER — Ambulatory Visit (INDEPENDENT_AMBULATORY_CARE_PROVIDER_SITE_OTHER): Payer: Medicare Other

## 2022-06-14 DIAGNOSIS — R2681 Unsteadiness on feet: Secondary | ICD-10-CM

## 2022-06-14 DIAGNOSIS — R2689 Other abnormalities of gait and mobility: Secondary | ICD-10-CM | POA: Diagnosis not present

## 2022-06-14 DIAGNOSIS — M6281 Muscle weakness (generalized): Secondary | ICD-10-CM

## 2022-06-14 DIAGNOSIS — I495 Sick sinus syndrome: Secondary | ICD-10-CM

## 2022-06-14 DIAGNOSIS — R293 Abnormal posture: Secondary | ICD-10-CM

## 2022-06-14 NOTE — Therapy (Signed)
OUTPATIENT PHYSICAL THERAPY TREATMENT   Patient Name: Victor Osborne MRN: [id number] DOB:1932/09/29, 89 y.o., male Today's Date: 06/14/2022   END OF SESSION:  PT End of Session - 06/14/22 1431     Visit Number 3    Date for PT Re-Evaluation 07/19/22    Authorization Type Medicare & [employer]    PT Start Time 1431    PT Stop Time 1512    PT Time Calculation (min) 41 min    Activity Tolerance Patient tolerated treatment well    Behavior During Therapy WFL for tasks assessed/performed              Past Medical History:  Diagnosis Date   Bipolar 1 disorder (HCC)    COPD (chronic obstructive pulmonary disease) (HCC)    Depression    First degree AV block    Hypertension    pt denies but was in medical record   Hypoglycemia, unspecified    Myalgia and myositis, unspecified    Obesity    Pneumonia 1970   Skin cancer (melanoma) (HCC)    Thyroid disease    Unspecified hypothyroidism    UPJ obstruction, congenital    Past Surgical History:  Procedure Laterality Date   APPENDECTOMY  1939   malignant melanoma removed from right forehead  2008   PERMANENT PACEMAKER INSERTION N/A 06/19/2013   Procedure: PERMANENT PACEMAKER INSERTION;  Surgeon: Marinus Maw, MD;  Location: [facility] CATH LAB;  Service: Cardiovascular;  Laterality: N/A;   right big toe  surgery  2009   TRANSURETHRAL RESECTION OF PROSTATE  02/04/2011   Procedure: TRANSURETHRAL RESECTION OF THE PROSTATE (TURP);  Surgeon: Crecencio Mc, MD;  Location: [facility] ORS;  Service: Urology;  Laterality: N/A;   URETHRA SURGERY  2010   reconstruction   Patient Active Problem List   Diagnosis Date Noted   Hyperlipidemia 03/15/2022   TIA (transient ischemic attack) 03/14/2022   History of neoplasm 04/12/2021   Melanocytic nevi of trunk 04/12/2021   Other seborrheic keratosis 04/12/2021   Cerebral embolism with cerebral infarction 03/31/2021   Left leg weakness 03/30/2021   Chronic post-traumatic stress disorder  12/05/2017   Bipolar I disorder, current or most recent episode depressed, in partial remission (HCC) 12/05/2017   CHB (complete heart block) (HCC) 01/27/2017   HTN (hypertension) 01/27/2017   Hypothyroidism 10/11/2016   Parkinson disease 09/08/2016   Chronic osteoarthritis 03/20/2015   Bipolar 1 disorder (HCC) 03/20/2015   Tremor 03/20/2015   Pacemaker 09/24/2013   SSS (sick sinus syndrome) (HCC) 06/19/2013   First degree AV block 04/09/2013   Allergic rhinitis 03/17/2013   Obstructive sleep apnea 12/18/2006   COPD mixed type (HCC) 12/18/2006   History of malignant melanoma of skin 12/18/2006   Diabetes mellitus (HCC) 12/18/2006    PCP: Olive Bass, FNP   REFERRING PROVIDER: Vladimir Faster, DO   REFERRING DIAG:  G45.9 (ICD-10-CM) - TIA (transient ischemic attack)  G20.A1 (ICD-10-CM) - Parkinson's disease without dyskinesia or fluctuating manifestations   THERAPY DIAG:  Other abnormalities of gait and mobility  Unsteadiness on feet  Muscle weakness (generalized)  Abnormal posture  RATIONALE FOR EVALUATION AND TREATMENT: Rehabilitation  ONSET DATE: 03/14/22 - TIA; Initial PD diagnosis >15 yrs ago  NEXT MD VISIT: 10/26/22   SUBJECTIVE:  SUBJECTIVE STATEMENT: Pt reports he has not been able to try the HEP stretches as he has been very busy.  PAIN: Are you having pain? No  PERTINENT HISTORY:  PPM 2 sick sinus syndrome/SA node dysfunction & complete heart block, orthostatic hypotension, COPD, OSA, HTN, DM2, Parkinson's Disease, CVA 03/2021, hypothyroidism, PTSD, bipolar I disorder   PRECAUTIONS: Fall and ICD/Pacemaker  WEIGHT BEARING RESTRICTIONS: No  FALLS:  Has patient fallen in last 6 months? No  LIVING ENVIRONMENT: Lives with: lives alone and with his  dog Lives in: Other Independent living facility (3rd floor) Stairs:  Elevator Has following equipment at home: Single point cane, Environmental consultant - 4 wheeled, and hiking pole  OCCUPATION: Retired  PLOF: Independent and Leisure: being outdoors - walking his dog, and stays active most of the day  PATIENT GOALS: "Improve my hand-eye coordination and especially balance."   OBJECTIVE: (objective measures completed at initial evaluation unless otherwise dated)  DIAGNOSTIC FINDINGS:  03/14/22 & 03/15/22 - CT Head w/o contrast:  IMPRESSION: 1. No evidence of acute intracranial abnormality. 2. Chronic small vessel ischemic disease and cerebral atrophy. 03/14/22 - CT Angio Head & Neck: IMPRESSION: 1. Negative CTA for large vessel occlusion or other emergent finding. 2. Mild for age atheromatous change about the carotid bifurcations and carotid siphons without hemodynamically significant stenosis.  COGNITION: Overall cognitive status: Within functional limits for tasks assessed   SENSATION: WFL Except neuropathy in his feet - most notable at night  COORDINATION: Gross motor WFL B UE/LE  MUSCLE LENGTH: Hamstrings: mod tight B ITB: mod tight L, mild tight R Piriformis: mild tight B Hip flexors: mod tight B Quads: mod tight B Heelcord: mod tight L, mild tight R  POSTURE:  rounded shoulders, forward head, and flexed trunk   LOWER EXTREMITY ROM:    Grossly WFL  LOWER EXTREMITY MMT:    MMT Right Eval Left Eval  Hip flexion 4 4  Hip extension 4 4  Hip abduction 4+ 4+  Hip adduction 4+ 4+  Hip internal rotation 4+ 4+  Hip external rotation 4 4  Knee flexion 5 5  Knee extension 5 5  Ankle dorsiflexion 4+ 4+  Ankle plantarflexion    Ankle inversion    Ankle eversion    (Blank rows = not tested)  BED MOBILITY:  Sit to supine Complete Independence Supine to sit Complete Independence Rolling to Right Complete Independence  TRANSFERS: Assistive device utilized: None  Sit to  stand: Complete Independence Stand to sit: Complete Independence Chair to chair: Modified independence Floor:  NT  GAIT: Distance walked: 100 ft Assistive device utilized: Environmental consultant - 4 wheeled and None Level of assistance: Modified independence and SBA Gait pattern: step through pattern, decreased arm swing- Right, decreased arm swing- Left, and trunk flexed Comments: more pronounced fwd head, flexed trunk posture noted than on previous PT episodes  RAMP: Level of Assistance:  NT Assistive device utilized:  NT Ramp Comments:   CURB:  Level of Assistance:  NT Assistive device utilized:  NT Curb Comments:   STAIRS:  Level of Assistance: SBA  Stair Negotiation Technique: Alternating Pattern  with Single Rail on Right  Number of Stairs: 14   Height of Stairs: 7"  Comments: Pt ascends stairs with weight mostly through ball of foot on step with heel hanging over edge of step causing slight drop and posterior shift at times, but no LOB due to grip on railing  FUNCTIONAL TESTS:  5 times sit to stand: 15.96 sec Timed up  and go (TUG): Normal = 12.56 sec with rollator, 9.09 sec w/o AD; Manual = 9.32 sec; Cognitive = 14.34 sec 10 meter walk test: 7.97 sec with rollator, 7.59 sec w/o AD; Gait speed = 4.12 ft/sec with rollator, 4.32 ft/sec w/o AD,  Berg Balance Scale: 43/56; 37-45 = significant risk for falls (>80%)  (06/09/22) Functional gait assessment: 17/30; < 19 = high risk fall  Scores as of discharge from last PT episode - December 2023: 5 times sit to stand: 12.13 sec Timed up and go (TUG): Normal = 10.45 sec; Manual = 11.45 sec; Cognitive = 15.15 sec 10 meter walk test: 7.5 sec (no AD); Gait speed = 4.37 ft/sec Berg: 53/56; 52-55 lower (> 25%) fall risk Functional gait assessment: 27/30; 25-28 = low risk fall   PATIENT SURVEYS:  ABC scale 1240 / 1600 = 77.5 %   TODAY'S TREATMENT:   06/14/22 THERAPEUTIC EXERCISE: to improve flexibility, strength and mobility.  Verbal and  tactile cues throughout for technique. NuStep - L5 x 5 min Seated hip hinge HS stretch x 30" bil Seated hip hinge figure-4 piriformis stretch x 30" bil Standing fwd lunge hip flexor stretch with UE support on wall x 30" bil Standing lateral lean ITB stretch with UE support on counter x 30" bil Standing RTB march with looped RTB at midfeet and UE support on back of chair x 10 Standing alt hip extension with looped RTB at ankles and UE support on back of chair x 10  NEUROMUSCULAR RE-EDUCATION: To improve posture, balance, coordination, reduce fall risk, amplitude of movement, speed of movement to reduce bradykinesia, and reduce rigidity. Seated PWR! Moves - Up, Rock, Twist & Step - x 10 with PT leading/counting, x 5 with pt counting   06/09/22 THERAPEUTIC ACTIVITIES: Berg: 43/56; 37-45 = significant risk for falls (>80%)   THERAPEUTIC EXERCISE: to improve flexibility, strength and mobility.  Verbal and tactile cues throughout for technique. NuStep - L5 x 5 min Seated hip hinge HS stretch x 30" bil Seated hip hinge figure-4 piriformis stretch x 30" bil Standing fwd lunge hip flexor stretch with UE support on wall x 30" bil Standing lateral lean ITB stretch with UE support on counter x 30" bil Standing RTB march with looped RTB at midfeet and UE support on back of chair x 10 Standing alt hip extension with looped RTB at ankles and UE support on back of chair x 10   06/07/22 Eval only  PATIENT EDUCATION:  Education details: PT eval findings and anticipated POC  Person educated: Patient Education method: Explanation Education comprehension: verbalized understanding  HOME EXERCISE PROGRAM: Access Code: [email or web address] URL: [email or web address] Date: 06/09/2022 Prepared by: Glenetta Hew  Exercises - Seated Hamstring Stretch  - 2 x daily - 7 x weekly - 3 reps - 30 sec hold - Seated Figure 4 Piriformis Stretch  - 2 x daily - 7 x weekly - 3 reps - 30 sec hold - Standing Hip  Flexor Stretch  - 2 x daily - 7 x weekly - 3 reps - 30 sec hold - Standing ITB Stretch  - 2 x daily - 7 x weekly - 3 reps - 30 sec hold - Marching with Resistance  - 1 x daily - 7 x weekly - 2 sets - 10 reps - 3 sec hold - Standing Hip Extension with Resistance at Ankles and Counter Support  - 1 x daily - 7 x weekly - 2 sets - 10 reps - 3 sec hold  Access  Code: [email or web address] from previous episode   ASSESSMENT:  CLINICAL IMPRESSION: Victor Osborne" reports he was not able to attempt the HEP stretches at home since last session as he has been very busy with appointments and helping out a friend, therefore reviewed these today. He was able to perform return demonstration of the HEP stretches with only minor clarifications necessary.  Re-introduced PWR! Moves today beginning with the seated PWR! Moves. Victor Osborne some limitations in flexibility with the PWR! Moves along with some fatigue but able to perform return demonstration of all movement patterns. He will continue to benefit from skilled PT to address his strength and balance deficits to improve mobility and activity tolerance with decreased pain interference.   OBJECTIVE IMPAIRMENTS: Abnormal gait, decreased activity tolerance, decreased balance, decreased coordination, decreased endurance, decreased mobility, difficulty walking, decreased strength, decreased safety awareness, impaired perceived functional ability, impaired flexibility, improper body mechanics, and postural dysfunction.   ACTIVITY LIMITATIONS: carrying, lifting, standing, squatting, stairs, transfers, bed mobility, reach over head, locomotion level, and caring for others  PARTICIPATION LIMITATIONS: meal prep, cleaning, laundry, driving, shopping, and community activity  PERSONAL FACTORS: Age, Fitness, Past/current experiences, Time since onset of injury/illness/exacerbation, and 3+ comorbidities: PPM 2 sick sinus syndrome/SA node dysfunction & complete heart block, orthostatic  hypotension, COPD, OSA, HTN, DM2, Parkinson's Disease, CVA 03/2021, hypothyroidism, PTSD, bipolar I disorder   are also affecting patient's functional outcome.   REHAB POTENTIAL: Good  CLINICAL DECISION MAKING: Evolving/moderate complexity  EVALUATION COMPLEXITY: Moderate   GOALS: Goals reviewed with patient? Yes  SHORT TERM GOALS: Target date: 06/28/2022  Patient will be independent with initial HEP. Baseline:  Goal status: IN PROGRESS  2.  Patient will improve 5x STS time to </= 14 seconds to demonstrate improved functional strength and transfer efficiency.. Baseline: 15.96 sec Goal status: IN PROGRESS  3.  Patient will be educated on strategies to decrease risk of falls.  Baseline:  Goal status: IN PROGRESS  LONG TERM GOALS: Target date: 07/19/2022  Patient will be independent with ongoing/advanced HEP for self-management at home incorporating PWR! Moves as indicated .  Baseline:  Goal status: IN PROGRESS  2.  Patient will be able to ambulate at least 800 ft with LRAD including outdoor and unlevel surfaces, independently with no LOB, for improved community gait. .  Baseline:  Goal status: IN PROGRESS  3.  Patient will be able to step up/down curb safely with LRAD for safety with community ambulation.  Baseline:  Goal status: IN PROGRESS   4.  Patient will demonstrate at least 25/30 on FGA to improve gait stability and reduce risk for falls. (MCID = 4 points) Baseline: 17/30 Goal status: IN PROGRESS  5.  Patient will improve Berg score to >/= 52/56 to improve safety and stability with ADLs in standing and reduce risk for falls. (MCID = 8 points)   Baseline: TBD Goal status: IN PROGRESS  6.  Patient will report >/= 85% on ABC scale to demonstrate improved balance confidence and decreased risk for falls. Baseline: 1240 / 1600 = 77.5 % Goal status: IN PROGRESS  7. Patient will verbalize understanding of local Parkinson's disease community resources, including community  fitness post d/c. Baseline:  Goal status: IN PROGRESS   PLAN:  PT FREQUENCY: 2x/week  PT DURATION: 6 weeks  PLANNED INTERVENTIONS: Therapeutic exercises, Therapeutic activity, Neuromuscular re-education, Balance training, Gait training, Patient/Family education, Self Care, Joint mobilization, Stair training, DME instructions, Manual therapy, and Re-evaluation  PLAN FOR NEXT SESSION: review initial HEP as needed;  progress lumbopelvic/LE flexibility and strengthening; progress PWR! Moves   [place], [place] 06/14/2022, 3:25 PM

## 2022-06-15 LAB — CUP PACEART REMOTE DEVICE CHECK
Battery Remaining Longevity: 36 mo
Battery Remaining Percentage: 38 %
Brady Statistic RA Percent Paced: 91 %
Brady Statistic RV Percent Paced: 71 %
Date Time Interrogation Session: 20240528003500
Implantable Lead Connection Status: 753985
Implantable Lead Connection Status: 753985
Implantable Lead Implant Date: 20150603
Implantable Lead Implant Date: 20150603
Implantable Lead Location: 753859
Implantable Lead Location: 753860
Implantable Lead Model: 4136
Implantable Lead Model: 4137
Implantable Lead Serial Number: 29477746
Implantable Lead Serial Number: 29617326
Implantable Pulse Generator Implant Date: 20150603
Lead Channel Impedance Value: 541 Ohm
Lead Channel Impedance Value: 734 Ohm
Lead Channel Pacing Threshold Amplitude: 1 V
Lead Channel Pacing Threshold Amplitude: 1.7 V
Lead Channel Pacing Threshold Pulse Width: 0.4 ms
Lead Channel Pacing Threshold Pulse Width: 0.5 ms
Lead Channel Setting Pacing Amplitude: 2 V
Lead Channel Setting Pacing Amplitude: 2.5 V
Lead Channel Setting Pacing Pulse Width: 0.4 ms
Lead Channel Setting Sensing Sensitivity: 2.5 mV
Pulse Gen Serial Number: 389736
Zone Setting Status: 755011

## 2022-06-16 ENCOUNTER — Encounter: Payer: Self-pay | Admitting: Physical Therapy

## 2022-06-16 ENCOUNTER — Ambulatory Visit: Payer: Medicare Other | Admitting: Physical Therapy

## 2022-06-16 DIAGNOSIS — R2689 Other abnormalities of gait and mobility: Secondary | ICD-10-CM

## 2022-06-16 DIAGNOSIS — R293 Abnormal posture: Secondary | ICD-10-CM

## 2022-06-16 DIAGNOSIS — M6281 Muscle weakness (generalized): Secondary | ICD-10-CM

## 2022-06-16 DIAGNOSIS — R2681 Unsteadiness on feet: Secondary | ICD-10-CM

## 2022-06-16 NOTE — Therapy (Signed)
OUTPATIENT PHYSICAL THERAPY TREATMENT   Patient Name: Victor Osborne MRN: [id number] DOB:06-Apr-1932, 89 y.o., male Today's Date: 06/16/2022   END OF SESSION:  PT End of Session - 06/16/22 1358     Visit Number 4    Date for PT Re-Evaluation 07/19/22    Authorization Type Medicare & [employer]    PT Start Time 1358    PT Stop Time 1442    PT Time Calculation (min) 44 min    Activity Tolerance Patient tolerated treatment well    Behavior During Therapy WFL for tasks assessed/performed              Past Medical History:  Diagnosis Date   Bipolar 1 disorder (HCC)    COPD (chronic obstructive pulmonary disease) (HCC)    Depression    First degree AV block    Hypertension    pt denies but was in medical record   Hypoglycemia, unspecified    Myalgia and myositis, unspecified    Obesity    Pneumonia 1970   Skin cancer (melanoma) (HCC)    Thyroid disease    Unspecified hypothyroidism    UPJ obstruction, congenital    Past Surgical History:  Procedure Laterality Date   APPENDECTOMY  1939   malignant melanoma removed from right forehead  2008   PERMANENT PACEMAKER INSERTION N/A 06/19/2013   Procedure: PERMANENT PACEMAKER INSERTION;  Surgeon: Marinus Maw, MD;  Location: [facility] CATH LAB;  Service: Cardiovascular;  Laterality: N/A;   right big toe  surgery  2009   TRANSURETHRAL RESECTION OF PROSTATE  02/04/2011   Procedure: TRANSURETHRAL RESECTION OF THE PROSTATE (TURP);  Surgeon: Crecencio Mc, MD;  Location: [facility] ORS;  Service: Urology;  Laterality: N/A;   URETHRA SURGERY  2010   reconstruction   Patient Active Problem List   Diagnosis Date Noted   Hyperlipidemia 03/15/2022   TIA (transient ischemic attack) 03/14/2022   History of neoplasm 04/12/2021   Melanocytic nevi of trunk 04/12/2021   Other seborrheic keratosis 04/12/2021   Cerebral embolism with cerebral infarction 03/31/2021   Left leg weakness 03/30/2021   Chronic post-traumatic stress disorder  12/05/2017   Bipolar I disorder, current or most recent episode depressed, in partial remission (HCC) 12/05/2017   CHB (complete heart block) (HCC) 01/27/2017   HTN (hypertension) 01/27/2017   Hypothyroidism 10/11/2016   Parkinson disease 09/08/2016   Chronic osteoarthritis 03/20/2015   Bipolar 1 disorder (HCC) 03/20/2015   Tremor 03/20/2015   Pacemaker 09/24/2013   SSS (sick sinus syndrome) (HCC) 06/19/2013   First degree AV block 04/09/2013   Allergic rhinitis 03/17/2013   Obstructive sleep apnea 12/18/2006   COPD mixed type (HCC) 12/18/2006   History of malignant melanoma of skin 12/18/2006   Diabetes mellitus (HCC) 12/18/2006    PCP: Olive Bass, FNP   REFERRING PROVIDER: Vladimir Faster, DO   REFERRING DIAG:  G45.9 (ICD-10-CM) - TIA (transient ischemic attack)  G20.A1 (ICD-10-CM) - Parkinson's disease without dyskinesia or fluctuating manifestations   THERAPY DIAG:  Other abnormalities of gait and mobility  Unsteadiness on feet  Muscle weakness (generalized)  Abnormal posture  RATIONALE FOR EVALUATION AND TREATMENT: Rehabilitation  ONSET DATE: 03/14/22 - TIA; Initial PD diagnosis >15 yrs ago  NEXT MD VISIT: 10/26/22   SUBJECTIVE:  SUBJECTIVE STATEMENT: Pt reports he tried out some of the HEP - no concerns, but notes he has to be cautious when performing the ITB stretch as it challenges his balance.  PAIN: Are you having pain? No  PERTINENT HISTORY:  PPM 2 sick sinus syndrome/SA node dysfunction & complete heart block, orthostatic hypotension, COPD, OSA, HTN, DM2, Parkinson's Disease, CVA 03/2021, hypothyroidism, PTSD, bipolar I disorder   PRECAUTIONS: Fall and ICD/Pacemaker  WEIGHT BEARING RESTRICTIONS: No  FALLS:  Has patient fallen in last 6  months? No  LIVING ENVIRONMENT: Lives with: lives alone and with his dog Lives in: Other Independent living facility (3rd floor) Stairs:  Elevator Has following equipment at home: Single point cane, Environmental consultant - 4 wheeled, and hiking pole  OCCUPATION: Retired  PLOF: Independent and Leisure: being outdoors - walking his dog, and stays active most of the day  PATIENT GOALS: "Improve my hand-eye coordination and especially balance."   OBJECTIVE: (objective measures completed at initial evaluation unless otherwise dated)  DIAGNOSTIC FINDINGS:  03/14/22 & 03/15/22 - CT Head w/o contrast:  IMPRESSION: 1. No evidence of acute intracranial abnormality. 2. Chronic small vessel ischemic disease and cerebral atrophy. 03/14/22 - CT Angio Head & Neck: IMPRESSION: 1. Negative CTA for large vessel occlusion or other emergent finding. 2. Mild for age atheromatous change about the carotid bifurcations and carotid siphons without hemodynamically significant stenosis.  COGNITION: Overall cognitive status: Within functional limits for tasks assessed   SENSATION: WFL Except neuropathy in his feet - most notable at night  COORDINATION: Gross motor WFL B UE/LE  MUSCLE LENGTH: Hamstrings: mod tight B ITB: mod tight L, mild tight R Piriformis: mild tight B Hip flexors: mod tight B Quads: mod tight B Heelcord: mod tight L, mild tight R  POSTURE:  rounded shoulders, forward head, and flexed trunk   LOWER EXTREMITY ROM:    Grossly WFL  LOWER EXTREMITY MMT:    MMT Right Eval Left Eval  Hip flexion 4 4  Hip extension 4 4  Hip abduction 4+ 4+  Hip adduction 4+ 4+  Hip internal rotation 4+ 4+  Hip external rotation 4 4  Knee flexion 5 5  Knee extension 5 5  Ankle dorsiflexion 4+ 4+  Ankle plantarflexion    Ankle inversion    Ankle eversion    (Blank rows = not tested)  BED MOBILITY:  Sit to supine Complete Independence Supine to sit Complete Independence Rolling to Right Complete  Independence  TRANSFERS: Assistive device utilized: None  Sit to stand: Complete Independence Stand to sit: Complete Independence Chair to chair: Modified independence Floor:  NT  GAIT: Distance walked: 100 ft Assistive device utilized: Environmental consultant - 4 wheeled and None Level of assistance: Modified independence and SBA Gait pattern: step through pattern, decreased arm swing- Right, decreased arm swing- Left, and trunk flexed Comments: more pronounced fwd head, flexed trunk posture noted than on previous PT episodes  RAMP: Level of Assistance:  NT Assistive device utilized:  NT Ramp Comments:   CURB:  Level of Assistance:  NT Assistive device utilized:  NT Curb Comments:   STAIRS:  Level of Assistance: SBA  Stair Negotiation Technique: Alternating Pattern  with Single Rail on Right  Number of Stairs: 14   Height of Stairs: 7"  Comments: Pt ascends stairs with weight mostly through ball of foot on step with heel hanging over edge of step causing slight drop and posterior shift at times, but no LOB due to grip on railing  FUNCTIONAL  TESTS:  5 times sit to stand: 15.96 sec Timed up and go (TUG): Normal = 12.56 sec with rollator, 9.09 sec w/o AD; Manual = 9.32 sec; Cognitive = 14.34 sec 10 meter walk test: 7.97 sec with rollator, 7.59 sec w/o AD; Gait speed = 4.12 ft/sec with rollator, 4.32 ft/sec w/o AD,  Berg Balance Scale: 43/56; 37-45 = significant risk for falls (>80%)  (06/09/22) Functional gait assessment: 17/30; < 19 = high risk fall  Scores as of discharge from last PT episode - December 2023: 5 times sit to stand: 12.13 sec Timed up and go (TUG): Normal = 10.45 sec; Manual = 11.45 sec; Cognitive = 15.15 sec 10 meter walk test: 7.5 sec (no AD); Gait speed = 4.37 ft/sec Berg: 53/56; 52-55 lower (> 25%) fall risk Functional gait assessment: 27/30; 25-28 = low risk fall   PATIENT SURVEYS:  ABC scale 1240 / 1600 = 77.5 %   TODAY'S TREATMENT:   06/16/22 THERAPEUTIC  EXERCISE: to improve flexibility, strength and mobility.  Verbal and tactile cues throughout for technique. NuStep - L5 x 5 min  NEUROMUSCULAR RE-EDUCATION: To improve posture, balance, coordination, reduce fall risk, amplitude of movement, speed of movement to reduce bradykinesia, and reduce rigidity. Seated PWR! Moves - Up, Rock, Twist & Step - x 10  Supine PWR! Moves - Up, [employer], Twist & Step - x 10 PWR! Sit to Stand x 10   06/14/22 THERAPEUTIC EXERCISE: to improve flexibility, strength and mobility.  Verbal and tactile cues throughout for technique. NuStep - L5 x 5 min Seated hip hinge HS stretch x 30" bil Seated hip hinge figure-4 piriformis stretch x 30" bil Standing fwd lunge hip flexor stretch with UE support on wall x 30" bil Standing lateral lean ITB stretch with UE support on counter x 30" bil Standing RTB march with looped RTB at midfeet and UE support on back of chair x 10 Standing alt hip extension with looped RTB at ankles and UE support on back of chair x 10  NEUROMUSCULAR RE-EDUCATION: To improve posture, balance, coordination, reduce fall risk, amplitude of movement, speed of movement to reduce bradykinesia, and reduce rigidity. Seated PWR! Moves - Up, Rock, Twist & Step - x 10 with PT leading/counting, x 5 with pt counting   06/09/22 THERAPEUTIC ACTIVITIES: Berg: 43/56; 37-45 = significant risk for falls (>80%)   THERAPEUTIC EXERCISE: to improve flexibility, strength and mobility.  Verbal and tactile cues throughout for technique. NuStep - L5 x 5 min Seated hip hinge HS stretch x 30" bil Seated hip hinge figure-4 piriformis stretch x 30" bil Standing fwd lunge hip flexor stretch with UE support on wall x 30" bil Standing lateral lean ITB stretch with UE support on counter x 30" bil Standing RTB march with looped RTB at midfeet and UE support on back of chair x 10 Standing alt hip extension with looped RTB at ankles and UE support on back of chair x 10   PATIENT  EDUCATION:  Education details: HEP progression - supine PWR! Moves   Person educated: Patient Education method: Explanation, Demonstration, Verbal cues, and Handouts Education comprehension: verbalized understanding, returned demonstration, and needs further education  HOME EXERCISE PROGRAM: Access Code: [email or web address] URL: [email or web address] Date: 06/09/2022 Prepared by: Glenetta Hew  Exercises - Seated Hamstring Stretch  - 2 x daily - 7 x weekly - 3 reps - 30 sec hold - Seated Figure 4 Piriformis Stretch  - 2 x daily - 7 x weekly - 3 reps - 30  sec hold - Standing Hip Flexor Stretch  - 2 x daily - 7 x weekly - 3 reps - 30 sec hold - Standing ITB Stretch  - 2 x daily - 7 x weekly - 3 reps - 30 sec hold - Marching with Resistance  - 1 x daily - 7 x weekly - 2 sets - 10 reps - 3 sec hold - Standing Hip Extension with Resistance at Ankles and Counter Support  - 1 x daily - 7 x weekly - 2 sets - 10 reps - 3 sec hold  PWR! Moves - Seated - Supine  Access Code: [email or web address] from previous episode   ASSESSMENT:  CLINICAL IMPRESSION: Rosalio Charlet" reports he was able to try out more of the HEP stretches and strengthening exercises and does not have any concerns other than noting he has to be careful not to lose his balance during the ITB stretch. He did feel he needs more practice with the PWR! Moves in sitting. Reviewed seated PWR! Moves with only minor clarifications necessary for PWR! Rock (clarifying flexion to one side with extension/elongation of opp side) and PWR! Twist (cues to return to neutral before rotating to opp side rather one arm following the other across body). Bill noting recent difficulty with rolling over and repositioning in bed, therefore proceeded to supine PWR! Moves to facilitate rolling process explaining t/o patterns how movements carryover into functional movements. He also notes similar increased difficultly with sit to stand, therefore demonstrated how  seated PWR! Up pattern can be used to facilitate weight shift to increase ease of sit to stand. Handout provided for supine PWR! Moves for continued performance at home.  OBJECTIVE IMPAIRMENTS: Abnormal gait, decreased activity tolerance, decreased balance, decreased coordination, decreased endurance, decreased mobility, difficulty walking, decreased strength, decreased safety awareness, impaired perceived functional ability, impaired flexibility, improper body mechanics, and postural dysfunction.   ACTIVITY LIMITATIONS: carrying, lifting, standing, squatting, stairs, transfers, bed mobility, reach over head, locomotion level, and caring for others  PARTICIPATION LIMITATIONS: meal prep, cleaning, laundry, driving, shopping, and community activity  PERSONAL FACTORS: Age, Fitness, Past/current experiences, Time since onset of injury/illness/exacerbation, and 3+ comorbidities: PPM 2 sick sinus syndrome/SA node dysfunction & complete heart block, orthostatic hypotension, COPD, OSA, HTN, DM2, Parkinson's Disease, CVA 03/2021, hypothyroidism, PTSD, bipolar I disorder   are also affecting patient's functional outcome.   REHAB POTENTIAL: Good  CLINICAL DECISION MAKING: Evolving/moderate complexity  EVALUATION COMPLEXITY: Moderate   GOALS: Goals reviewed with patient? Yes  SHORT TERM GOALS: Target date: 06/28/2022  Patient will be independent with initial HEP. Baseline:  Goal status: MET  06/16/22   2.  Patient will improve 5x STS time to </= 14 seconds to demonstrate improved functional strength and transfer efficiency.. Baseline: 15.96 sec Goal status: IN PROGRESS  3.  Patient will be educated on strategies to decrease risk of falls.  Baseline:  Goal status: IN PROGRESS  LONG TERM GOALS: Target date: 07/19/2022  Patient will be independent with ongoing/advanced HEP for self-management at home incorporating PWR! Moves as indicated .  Baseline:  Goal status: IN PROGRESS  2.  Patient will  be able to ambulate at least 800 ft with LRAD including outdoor and unlevel surfaces, independently with no LOB, for improved community gait. .  Baseline:  Goal status: IN PROGRESS  3.  Patient will be able to step up/down curb safely with LRAD for safety with community ambulation.  Baseline:  Goal status: IN PROGRESS   4.  Patient will demonstrate at  least 25/30 on FGA to improve gait stability and reduce risk for falls. (MCID = 4 points) Baseline: 17/30 Goal status: IN PROGRESS  5.  Patient will improve Berg score to >/= 52/56 to improve safety and stability with ADLs in standing and reduce risk for falls. (MCID = 8 points)   Baseline: TBD Goal status: IN PROGRESS  6.  Patient will report >/= 85% on ABC scale to demonstrate improved balance confidence and decreased risk for falls. Baseline: 1240 / 1600 = 77.5 % Goal status: IN PROGRESS  7. Patient will verbalize understanding of local Parkinson's disease community resources, including community fitness post d/c. Baseline:  Goal status: IN PROGRESS   PLAN:  PT FREQUENCY: 2x/week  PT DURATION: 6 weeks  PLANNED INTERVENTIONS: Therapeutic exercises, Therapeutic activity, Neuromuscular re-education, Balance training, Gait training, Patient/Family education, Self Care, Joint mobilization, Stair training, DME instructions, Manual therapy, and Re-evaluation  PLAN FOR NEXT SESSION: Fall prevention education; progress lumbopelvic/LE flexibility and strengthening - review/update HEP as needed; progress PWR! Moves   [place], Basile 06/16/2022, 4:07 PM

## 2022-06-21 ENCOUNTER — Ambulatory Visit: Payer: Medicare Other | Attending: Neurology

## 2022-06-21 DIAGNOSIS — R293 Abnormal posture: Secondary | ICD-10-CM | POA: Insufficient documentation

## 2022-06-21 DIAGNOSIS — R2689 Other abnormalities of gait and mobility: Secondary | ICD-10-CM | POA: Insufficient documentation

## 2022-06-21 DIAGNOSIS — R2681 Unsteadiness on feet: Secondary | ICD-10-CM | POA: Diagnosis present

## 2022-06-21 DIAGNOSIS — M6281 Muscle weakness (generalized): Secondary | ICD-10-CM | POA: Diagnosis present

## 2022-06-21 NOTE — Therapy (Signed)
OUTPATIENT PHYSICAL THERAPY TREATMENT   Patient Name: Victor Osborne MRN: [id number] DOB:01/15/1933, 89 y.o., male Today's Date: 06/21/2022   END OF SESSION:  PT End of Session - 06/21/22 1102     Visit Number 5    Date for PT Re-Evaluation 07/19/22    Authorization Type Medicare & [employer]    PT Start Time 1017    PT Stop Time 1059    PT Time Calculation (min) 42 min    Activity Tolerance Patient tolerated treatment well    Behavior During Therapy WFL for tasks assessed/performed              Past Medical History:  Diagnosis Date   Bipolar 1 disorder (HCC)    COPD (chronic obstructive pulmonary disease) (HCC)    Depression    First degree AV block    Hypertension    pt denies but was in medical record   Hypoglycemia, unspecified    Myalgia and myositis, unspecified    Obesity    Pneumonia 1970   Skin cancer (melanoma) (HCC)    Thyroid disease    Unspecified hypothyroidism    UPJ obstruction, congenital    Past Surgical History:  Procedure Laterality Date   APPENDECTOMY  1939   malignant melanoma removed from right forehead  2008   PERMANENT PACEMAKER INSERTION N/A 06/19/2013   Procedure: PERMANENT PACEMAKER INSERTION;  Surgeon: Marinus Maw, MD;  Location: [facility] CATH LAB;  Service: Cardiovascular;  Laterality: N/A;   right big toe  surgery  2009   TRANSURETHRAL RESECTION OF PROSTATE  02/04/2011   Procedure: TRANSURETHRAL RESECTION OF THE PROSTATE (TURP);  Surgeon: Crecencio Mc, MD;  Location: [facility] ORS;  Service: Urology;  Laterality: N/A;   URETHRA SURGERY  2010   reconstruction   Patient Active Problem List   Diagnosis Date Noted   Hyperlipidemia 03/15/2022   TIA (transient ischemic attack) 03/14/2022   History of neoplasm 04/12/2021   Melanocytic nevi of trunk 04/12/2021   Other seborrheic keratosis 04/12/2021   Cerebral embolism with cerebral infarction 03/31/2021   Left leg weakness 03/30/2021   Chronic post-traumatic stress disorder  12/05/2017   Bipolar I disorder, current or most recent episode depressed, in partial remission (HCC) 12/05/2017   CHB (complete heart block) (HCC) 01/27/2017   HTN (hypertension) 01/27/2017   Hypothyroidism 10/11/2016   Parkinson disease 09/08/2016   Chronic osteoarthritis 03/20/2015   Bipolar 1 disorder (HCC) 03/20/2015   Tremor 03/20/2015   Pacemaker 09/24/2013   SSS (sick sinus syndrome) (HCC) 06/19/2013   First degree AV block 04/09/2013   Allergic rhinitis 03/17/2013   Obstructive sleep apnea 12/18/2006   COPD mixed type (HCC) 12/18/2006   History of malignant melanoma of skin 12/18/2006   Diabetes mellitus (HCC) 12/18/2006    PCP: Olive Bass, FNP   REFERRING PROVIDER: Vladimir Faster, DO   REFERRING DIAG:  G45.9 (ICD-10-CM) - TIA (transient ischemic attack)  G20.A1 (ICD-10-CM) - Parkinson's disease without dyskinesia or fluctuating manifestations   THERAPY DIAG:  Other abnormalities of gait and mobility  Unsteadiness on feet  Muscle weakness (generalized)  Abnormal posture  RATIONALE FOR EVALUATION AND TREATMENT: Rehabilitation  ONSET DATE: 03/14/22 - TIA; Initial PD diagnosis >15 yrs ago  NEXT MD VISIT: 10/26/22   SUBJECTIVE:  SUBJECTIVE STATEMENT: Pt reports no falls and no pain. A little tired today  PAIN: Are you having pain? No  PERTINENT HISTORY:  PPM 2 sick sinus syndrome/SA node dysfunction & complete heart block, orthostatic hypotension, COPD, OSA, HTN, DM2, Parkinson's Disease, CVA 03/2021, hypothyroidism, PTSD, bipolar I disorder   PRECAUTIONS: Fall and ICD/Pacemaker  WEIGHT BEARING RESTRICTIONS: No  FALLS:  Has patient fallen in last 6 months? No  LIVING ENVIRONMENT: Lives with: lives alone and with his dog Lives in: Other  Independent living facility (3rd floor) Stairs:  Elevator Has following equipment at home: Single point cane, Environmental consultant - 4 wheeled, and hiking pole  OCCUPATION: Retired  PLOF: Independent and Leisure: being outdoors - walking his dog, and stays active most of the day  PATIENT GOALS: "Improve my hand-eye coordination and especially balance."   OBJECTIVE: (objective measures completed at initial evaluation unless otherwise dated)  DIAGNOSTIC FINDINGS:  03/14/22 & 03/15/22 - CT Head w/o contrast:  IMPRESSION: 1. No evidence of acute intracranial abnormality. 2. Chronic small vessel ischemic disease and cerebral atrophy. 03/14/22 - CT Angio Head & Neck: IMPRESSION: 1. Negative CTA for large vessel occlusion or other emergent finding. 2. Mild for age atheromatous change about the carotid bifurcations and carotid siphons without hemodynamically significant stenosis.  COGNITION: Overall cognitive status: Within functional limits for tasks assessed   SENSATION: WFL Except neuropathy in his feet - most notable at night  COORDINATION: Gross motor WFL B UE/LE  MUSCLE LENGTH: Hamstrings: mod tight B ITB: mod tight L, mild tight R Piriformis: mild tight B Hip flexors: mod tight B Quads: mod tight B Heelcord: mod tight L, mild tight R  POSTURE:  rounded shoulders, forward head, and flexed trunk   LOWER EXTREMITY ROM:    Grossly WFL  LOWER EXTREMITY MMT:    MMT Right Eval Left Eval  Hip flexion 4 4  Hip extension 4 4  Hip abduction 4+ 4+  Hip adduction 4+ 4+  Hip internal rotation 4+ 4+  Hip external rotation 4 4  Knee flexion 5 5  Knee extension 5 5  Ankle dorsiflexion 4+ 4+  Ankle plantarflexion    Ankle inversion    Ankle eversion    (Blank rows = not tested)  BED MOBILITY:  Sit to supine Complete Independence Supine to sit Complete Independence Rolling to Right Complete Independence  TRANSFERS: Assistive device utilized: None  Sit to stand: Complete  Independence Stand to sit: Complete Independence Chair to chair: Modified independence Floor:  NT  GAIT: Distance walked: 100 ft Assistive device utilized: Environmental consultant - 4 wheeled and None Level of assistance: Modified independence and SBA Gait pattern: step through pattern, decreased arm swing- Right, decreased arm swing- Left, and trunk flexed Comments: more pronounced fwd head, flexed trunk posture noted than on previous PT episodes  RAMP: Level of Assistance:  NT Assistive device utilized:  NT Ramp Comments:   CURB:  Level of Assistance:  NT Assistive device utilized:  NT Curb Comments:   STAIRS:  Level of Assistance: SBA  Stair Negotiation Technique: Alternating Pattern  with Single Rail on Right  Number of Stairs: 14   Height of Stairs: 7"  Comments: Pt ascends stairs with weight mostly through ball of foot on step with heel hanging over edge of step causing slight drop and posterior shift at times, but no LOB due to grip on railing  FUNCTIONAL TESTS:  5 times sit to stand: 15.96 sec Timed up and go (TUG): Normal = 12.56 sec  with rollator, 9.09 sec w/o AD; Manual = 9.32 sec; Cognitive = 14.34 sec 10 meter walk test: 7.97 sec with rollator, 7.59 sec w/o AD; Gait speed = 4.12 ft/sec with rollator, 4.32 ft/sec w/o AD,  Berg Balance Scale: 43/56; 37-45 = significant risk for falls (>80%)  (06/09/22) Functional gait assessment: 17/30; < 19 = high risk fall  Scores as of discharge from last PT episode - December 2023: 5 times sit to stand: 12.13 sec Timed up and go (TUG): Normal = 10.45 sec; Manual = 11.45 sec; Cognitive = 15.15 sec 10 meter walk test: 7.5 sec (no AD); Gait speed = 4.37 ft/sec Berg: 53/56; 52-55 lower (> 25%) fall risk Functional gait assessment: 27/30; 25-28 = low risk fall   PATIENT SURVEYS:  ABC scale 1240 / 1600 = 77.5 %   TODAY'S TREATMENT:             06/21/22 THERAPEUTIC EXERCISE: to improve flexibility, strength and mobility.  Verbal and tactile cues  throughout for technique. NuStep - L5 x 6 min Seated shoulder squeeze x 10 Seated trunk twist x 10  Seated horizontal abduction red TB x 10  Seated alt diagonals red TB x 10  Standing bird dog 2x10 bil Standing hip extension 2# bil 2x10 Standing HS curls 2# 2x10 Stanidng rows 2x10 red TB Standing shld ext red TB 2x10 Standing shoulder ER red TB 2x10 Standing pallof press gtb x 10   06/16/22 THERAPEUTIC EXERCISE: to improve flexibility, strength and mobility.  Verbal and tactile cues throughout for technique. NuStep - L5 x 5 min  NEUROMUSCULAR RE-EDUCATION: To improve posture, balance, coordination, reduce fall risk, amplitude of movement, speed of movement to reduce bradykinesia, and reduce rigidity. Seated PWR! Moves - Up, Rock, Twist & Step - x 10  Supine PWR! Moves - Up, [employer], Twist & Step - x 10 PWR! Sit to Stand x 10   06/14/22 THERAPEUTIC EXERCISE: to improve flexibility, strength and mobility.  Verbal and tactile cues throughout for technique. NuStep - L5 x 5 min Seated hip hinge HS stretch x 30" bil Seated hip hinge figure-4 piriformis stretch x 30" bil Standing fwd lunge hip flexor stretch with UE support on wall x 30" bil Standing lateral lean ITB stretch with UE support on counter x 30" bil Standing RTB march with looped RTB at midfeet and UE support on back of chair x 10 Standing alt hip extension with looped RTB at ankles and UE support on back of chair x 10  NEUROMUSCULAR RE-EDUCATION: To improve posture, balance, coordination, reduce fall risk, amplitude of movement, speed of movement to reduce bradykinesia, and reduce rigidity. Seated PWR! Moves - Up, Rock, Twist & Step - x 10 with PT leading/counting, x 5 with pt counting   06/09/22 THERAPEUTIC ACTIVITIES: Berg: 43/56; 37-45 = significant risk for falls (>80%)   THERAPEUTIC EXERCISE: to improve flexibility, strength and mobility.  Verbal and tactile cues throughout for technique. NuStep - L5 x 5 min Seated  hip hinge HS stretch x 30" bil Seated hip hinge figure-4 piriformis stretch x 30" bil Standing fwd lunge hip flexor stretch with UE support on wall x 30" bil Standing lateral lean ITB stretch with UE support on counter x 30" bil Standing RTB march with looped RTB at midfeet and UE support on back of chair x 10 Standing alt hip extension with looped RTB at ankles and UE support on back of chair x 10   PATIENT EDUCATION:  Education details: HEP progression -  supine PWR! Moves   Person educated: Patient Education method: Explanation, Demonstration, Verbal cues, and Handouts Education comprehension: verbalized understanding, returned demonstration, and needs further education  HOME EXERCISE PROGRAM: Access Code: [email or web address] URL: [email or web address] Date: 06/09/2022 Prepared by: Glenetta Hew  Exercises - Seated Hamstring Stretch  - 2 x daily - 7 x weekly - 3 reps - 30 sec hold - Seated Figure 4 Piriformis Stretch  - 2 x daily - 7 x weekly - 3 reps - 30 sec hold - Standing Hip Flexor Stretch  - 2 x daily - 7 x weekly - 3 reps - 30 sec hold - Standing ITB Stretch  - 2 x daily - 7 x weekly - 3 reps - 30 sec hold - Marching with Resistance  - 1 x daily - 7 x weekly - 2 sets - 10 reps - 3 sec hold - Standing Hip Extension with Resistance at Ankles and Counter Support  - 1 x daily - 7 x weekly - 2 sets - 10 reps - 3 sec hold  PWR! Moves - Seated - Supine  Access Code: [email or web address] from previous episode   ASSESSMENT:  CLINICAL IMPRESSION: Progressed through general strengthening and mobility to improve posture, strength, and endurance. Postural cues required throughout session with standing exercises. Rest breaks taken occasionally d/t fatigue for recovery. Pt showed a good response to treatment.   OBJECTIVE IMPAIRMENTS: Abnormal gait, decreased activity tolerance, decreased balance, decreased coordination, decreased endurance, decreased mobility, difficulty walking, decreased  strength, decreased safety awareness, impaired perceived functional ability, impaired flexibility, improper body mechanics, and postural dysfunction.   ACTIVITY LIMITATIONS: carrying, lifting, standing, squatting, stairs, transfers, bed mobility, reach over head, locomotion level, and caring for others  PARTICIPATION LIMITATIONS: meal prep, cleaning, laundry, driving, shopping, and community activity  PERSONAL FACTORS: Age, Fitness, Past/current experiences, Time since onset of injury/illness/exacerbation, and 3+ comorbidities: PPM 2 sick sinus syndrome/SA node dysfunction & complete heart block, orthostatic hypotension, COPD, OSA, HTN, DM2, Parkinson's Disease, CVA 03/2021, hypothyroidism, PTSD, bipolar I disorder   are also affecting patient's functional outcome.   REHAB POTENTIAL: Good  CLINICAL DECISION MAKING: Evolving/moderate complexity  EVALUATION COMPLEXITY: Moderate   GOALS: Goals reviewed with patient? Yes  SHORT TERM GOALS: Target date: 06/28/2022  Patient will be independent with initial HEP. Baseline:  Goal status: MET  06/16/22   2.  Patient will improve 5x STS time to </= 14 seconds to demonstrate improved functional strength and transfer efficiency.. Baseline: 15.96 sec Goal status: IN PROGRESS  3.  Patient will be educated on strategies to decrease risk of falls.  Baseline:  Goal status: IN PROGRESS  LONG TERM GOALS: Target date: 07/19/2022  Patient will be independent with ongoing/advanced HEP for self-management at home incorporating PWR! Moves as indicated .  Baseline:  Goal status: IN PROGRESS  2.  Patient will be able to ambulate at least 800 ft with LRAD including outdoor and unlevel surfaces, independently with no LOB, for improved community gait. .  Baseline:  Goal status: IN PROGRESS  3.  Patient will be able to step up/down curb safely with LRAD for safety with community ambulation.  Baseline:  Goal status: IN PROGRESS   4.  Patient will  demonstrate at least 25/30 on FGA to improve gait stability and reduce risk for falls. (MCID = 4 points) Baseline: 17/30 Goal status: IN PROGRESS  5.  Patient will improve Berg score to >/= 52/56 to improve safety and stability with ADLs in standing and reduce risk for falls. (MCID =  8 points)   Baseline: TBD Goal status: IN PROGRESS  6.  Patient will report >/= 85% on ABC scale to demonstrate improved balance confidence and decreased risk for falls. Baseline: 1240 / 1600 = 77.5 % Goal status: IN PROGRESS  7. Patient will verbalize understanding of local Parkinson's disease community resources, including community fitness post d/c. Baseline:  Goal status: IN PROGRESS   PLAN:  PT FREQUENCY: 2x/week  PT DURATION: 6 weeks  PLANNED INTERVENTIONS: Therapeutic exercises, Therapeutic activity, Neuromuscular re-education, Balance training, Gait training, Patient/Family education, Self Care, Joint mobilization, Stair training, DME instructions, Manual therapy, and Re-evaluation  PLAN FOR NEXT SESSION: Fall prevention education; progress lumbopelvic/LE flexibility and strengthening - review/update HEP as needed; progress PWR! Moves   Darleene Cleaver, PTA 06/21/2022, 11:20 AM

## 2022-06-23 ENCOUNTER — Ambulatory Visit: Payer: Medicare Other | Admitting: Physical Therapy

## 2022-06-27 ENCOUNTER — Ambulatory Visit: Payer: Medicare Other | Admitting: Physical Therapy

## 2022-06-27 ENCOUNTER — Encounter: Payer: Self-pay | Admitting: Physical Therapy

## 2022-06-27 ENCOUNTER — Ambulatory Visit: Payer: Medicare Other

## 2022-06-27 DIAGNOSIS — R2689 Other abnormalities of gait and mobility: Secondary | ICD-10-CM

## 2022-06-27 DIAGNOSIS — M6281 Muscle weakness (generalized): Secondary | ICD-10-CM

## 2022-06-27 DIAGNOSIS — R293 Abnormal posture: Secondary | ICD-10-CM

## 2022-06-27 DIAGNOSIS — R2681 Unsteadiness on feet: Secondary | ICD-10-CM

## 2022-06-27 NOTE — Therapy (Signed)
OUTPATIENT PHYSICAL THERAPY TREATMENT   Patient Name: Victor Osborne MRN: [id number] DOB:01-Jun-1932, 89 y.o., male Today's Date: 06/27/2022   END OF SESSION:  PT End of Session - 06/27/22 1620     Visit Number 6    Date for PT Re-Evaluation 07/19/22    Authorization Type Medicare & [employer]    PT Start Time 1620    PT Stop Time 1700    PT Time Calculation (min) 40 min    Activity Tolerance Patient tolerated treatment well    Behavior During Therapy WFL for tasks assessed/performed              Past Medical History:  Diagnosis Date   Bipolar 1 disorder (HCC)    COPD (chronic obstructive pulmonary disease) (HCC)    Depression    First degree AV block    Hypertension    pt denies but was in medical record   Hypoglycemia, unspecified    Myalgia and myositis, unspecified    Obesity    Pneumonia 1970   Skin cancer (melanoma) (HCC)    Thyroid disease    Unspecified hypothyroidism    UPJ obstruction, congenital    Past Surgical History:  Procedure Laterality Date   APPENDECTOMY  1939   malignant melanoma removed from right forehead  2008   PERMANENT PACEMAKER INSERTION N/A 06/19/2013   Procedure: PERMANENT PACEMAKER INSERTION;  Surgeon: Marinus Maw, MD;  Location: [facility] CATH LAB;  Service: Cardiovascular;  Laterality: N/A;   right big toe  surgery  2009   TRANSURETHRAL RESECTION OF PROSTATE  02/04/2011   Procedure: TRANSURETHRAL RESECTION OF THE PROSTATE (TURP);  Surgeon: Crecencio Mc, MD;  Location: [facility] ORS;  Service: Urology;  Laterality: N/A;   URETHRA SURGERY  2010   reconstruction   Patient Active Problem List   Diagnosis Date Noted   Hyperlipidemia 03/15/2022   TIA (transient ischemic attack) 03/14/2022   History of neoplasm 04/12/2021   Melanocytic nevi of trunk 04/12/2021   Other seborrheic keratosis 04/12/2021   Cerebral embolism with cerebral infarction 03/31/2021   Left leg weakness 03/30/2021   Chronic post-traumatic stress disorder  12/05/2017   Bipolar I disorder, current or most recent episode depressed, in partial remission (HCC) 12/05/2017   CHB (complete heart block) (HCC) 01/27/2017   HTN (hypertension) 01/27/2017   Hypothyroidism 10/11/2016   Parkinson disease 09/08/2016   Chronic osteoarthritis 03/20/2015   Bipolar 1 disorder (HCC) 03/20/2015   Tremor 03/20/2015   Pacemaker 09/24/2013   SSS (sick sinus syndrome) (HCC) 06/19/2013   First degree AV block 04/09/2013   Allergic rhinitis 03/17/2013   Obstructive sleep apnea 12/18/2006   COPD mixed type (HCC) 12/18/2006   History of malignant melanoma of skin 12/18/2006   Diabetes mellitus (HCC) 12/18/2006    PCP: Olive Bass, FNP   REFERRING PROVIDER: Vladimir Faster, DO   REFERRING DIAG:  G45.9 (ICD-10-CM) - TIA (transient ischemic attack)  G20.A1 (ICD-10-CM) - Parkinson's disease without dyskinesia or fluctuating manifestations   THERAPY DIAG:  Other abnormalities of gait and mobility  Unsteadiness on feet  Muscle weakness (generalized)  Abnormal posture  RATIONALE FOR EVALUATION AND TREATMENT: Rehabilitation  ONSET DATE: 03/14/22 - TIA; Initial PD diagnosis >15 yrs ago  NEXT MD VISIT: 10/26/22   SUBJECTIVE:  SUBJECTIVE STATEMENT: Pt reports the stretches from last visit helped him feel more flexible.  PAIN: Are you having pain? No  PERTINENT HISTORY:  PPM 2 sick sinus syndrome/SA node dysfunction & complete heart block, orthostatic hypotension, COPD, OSA, HTN, DM2, Parkinson's Disease, CVA 03/2021, hypothyroidism, PTSD, bipolar I disorder   PRECAUTIONS: Fall and ICD/Pacemaker  WEIGHT BEARING RESTRICTIONS: No  FALLS:  Has patient fallen in last 6 months? No  LIVING ENVIRONMENT: Lives with: lives alone and with his dog Lives  in: Other Independent living facility (3rd floor) Stairs:  Elevator Has following equipment at home: Single point cane, Environmental consultant - 4 wheeled, and hiking pole  OCCUPATION: Retired  PLOF: Independent and Leisure: being outdoors - walking his dog, and stays active most of the day  PATIENT GOALS: "Improve my hand-eye coordination and especially balance."   OBJECTIVE: (objective measures completed at initial evaluation unless otherwise dated)  DIAGNOSTIC FINDINGS:  03/14/22 & 03/15/22 - CT Head w/o contrast:  IMPRESSION: 1. No evidence of acute intracranial abnormality. 2. Chronic small vessel ischemic disease and cerebral atrophy. 03/14/22 - CT Angio Head & Neck: IMPRESSION: 1. Negative CTA for large vessel occlusion or other emergent finding. 2. Mild for age atheromatous change about the carotid bifurcations and carotid siphons without hemodynamically significant stenosis.  COGNITION: Overall cognitive status: Within functional limits for tasks assessed   SENSATION: WFL Except neuropathy in his feet - most notable at night  COORDINATION: Gross motor WFL B UE/LE  MUSCLE LENGTH: Hamstrings: mod tight B ITB: mod tight L, mild tight R Piriformis: mild tight B Hip flexors: mod tight B Quads: mod tight B Heelcord: mod tight L, mild tight R  POSTURE:  rounded shoulders, forward head, and flexed trunk   LOWER EXTREMITY ROM:    Grossly WFL  LOWER EXTREMITY MMT:    MMT Right Eval Left Eval  Hip flexion 4 4  Hip extension 4 4  Hip abduction 4+ 4+  Hip adduction 4+ 4+  Hip internal rotation 4+ 4+  Hip external rotation 4 4  Knee flexion 5 5  Knee extension 5 5  Ankle dorsiflexion 4+ 4+  Ankle plantarflexion    Ankle inversion    Ankle eversion    (Blank rows = not tested)  BED MOBILITY:  Sit to supine Complete Independence Supine to sit Complete Independence Rolling to Right Complete Independence  TRANSFERS: Assistive device utilized: None  Sit to stand: Complete  Independence Stand to sit: Complete Independence Chair to chair: Modified independence Floor:  NT  GAIT: Distance walked: 100 ft Assistive device utilized: Environmental consultant - 4 wheeled and None Level of assistance: Modified independence and SBA Gait pattern: step through pattern, decreased arm swing- Right, decreased arm swing- Left, and trunk flexed Comments: more pronounced fwd head, flexed trunk posture noted than on previous PT episodes  RAMP: Level of Assistance:  NT Assistive device utilized:  NT Ramp Comments:   CURB:  Level of Assistance:  NT Assistive device utilized:  NT Curb Comments:   STAIRS:  Level of Assistance: SBA  Stair Negotiation Technique: Alternating Pattern  with Single Rail on Right  Number of Stairs: 14   Height of Stairs: 7"  Comments: Pt ascends stairs with weight mostly through ball of foot on step with heel hanging over edge of step causing slight drop and posterior shift at times, but no LOB due to grip on railing  FUNCTIONAL TESTS:  5 times sit to stand: 15.96 sec Timed up and go (TUG): Normal = 12.56  sec with rollator, 9.09 sec w/o AD; Manual = 9.32 sec; Cognitive = 14.34 sec 10 meter walk test: 7.97 sec with rollator, 7.59 sec w/o AD; Gait speed = 4.12 ft/sec with rollator, 4.32 ft/sec w/o AD,  Berg Balance Scale: 43/56; 37-45 = significant risk for falls (>80%)  (06/09/22) Functional gait assessment: 17/30; < 19 = high risk fall  06/27/22: 5xSTS = 16.25 sec  Scores as of discharge from last PT episode - December 2023: 5 times sit to stand: 12.13 sec Timed up and go (TUG): Normal = 10.45 sec; Manual = 11.45 sec; Cognitive = 15.15 sec 10 meter walk test: 7.5 sec (no AD); Gait speed = 4.37 ft/sec Berg: 53/56; 52-55 lower (> 25%) fall risk Functional gait assessment: 27/30; 25-28 = low risk fall   PATIENT SURVEYS:  ABC scale 1240 / 1600 = 77.5 %   TODAY'S TREATMENT:              06/27/22 THERAPEUTIC EXERCISE: to improve flexibility, strength and  mobility.  Verbal and tactile cues throughout for technique. NuStep - L5 x 6 min (UE/LE to promote improved reciprocal movement patterns)  THERAPEUTIC ACTIVITIES: 5xSTS = 16.25 sec  NEUROMUSCULAR RE-EDUCATION: To improve posture, coordination, reduce fall risk, amplitude of movement, speed of movement to reduce bradykinesia, and reduce rigidity. Supine PWR! Moves - Up, [employer], Twist & Step - x 10 PWR! Sit to Stand x 10 Standing PWR! Moves - Up, [employer], Twist & Step - x 10 PWR! Step & Turn (1/4 turn) R/L x 4 - discontinued d/t c/o dizziness  SELF CARE: Reviewed the "Check for Safety - Home Fall Prevention Checklist for Older Adults" to help identify fall risk hazards in the home along with strategies to reduce fall risk at home - pt notes only concern being his dog's toys sometimes being left in his walkways.   06/21/22 THERAPEUTIC EXERCISE: to improve flexibility, strength and mobility.  Verbal and tactile cues throughout for technique. NuStep - L5 x 6 min Seated shoulder squeeze x 10 Seated trunk twist x 10  Seated horizontal abduction red TB x 10  Seated alt diagonals red TB x 10  Standing bird dog 2x10 bil Standing hip extension 2# bil 2x10 Standing HS curls 2# 2x10 Stanidng rows 2x10 red TB Standing shld ext red TB 2x10 Standing shoulder ER red TB 2x10 Standing pallof press gtb x 10    06/16/22 THERAPEUTIC EXERCISE: to improve flexibility, strength and mobility.  Verbal and tactile cues throughout for technique. NuStep - L5 x 5 min  NEUROMUSCULAR RE-EDUCATION: To improve posture, balance, coordination, reduce fall risk, amplitude of movement, speed of movement to reduce bradykinesia, and reduce rigidity. Seated PWR! Moves - Up, Rock, Twist & Step - x 10  Supine PWR! Moves - Up, [employer], Twist & Step - x 10 PWR! Sit to Stand x 10   PATIENT EDUCATION:  Education details: HEP progression - standing PWR! Moves   Person educated: Patient Education method: Explanation, Demonstration,  Verbal cues, and Handouts Education comprehension: verbalized understanding, returned demonstration, and needs further education  HOME EXERCISE PROGRAM: Access Code: [email or web address] URL: [email or web address] Date: 06/09/2022 Prepared by: Glenetta Hew  Exercises - Seated Hamstring Stretch  - 2 x daily - 7 x weekly - 3 reps - 30 sec hold - Seated Figure 4 Piriformis Stretch  - 2 x daily - 7 x weekly - 3 reps - 30 sec hold - Standing Hip Flexor Stretch  - 2 x daily - 7 x weekly - 3  reps - 30 sec hold - Standing ITB Stretch  - 2 x daily - 7 x weekly - 3 reps - 30 sec hold - Marching with Resistance  - 1 x daily - 7 x weekly - 2 sets - 10 reps - 3 sec hold - Standing Hip Extension with Resistance at Ankles and Counter Support  - 1 x daily - 7 x weekly - 2 sets - 10 reps - 3 sec hold  PWR! Moves - Seated - Supine - Standing  Access Code: [email or web address] from previous episode   ASSESSMENT:  CLINICAL IMPRESSION: Dougles Tillerson" noting benefit from stretching and postural exercises last visit.  He requested to review supine PWR! Moves with repeat instructions necessary for proper technique with all movement patterns in supine.  Reassessment of 5xSTS revealing no significant change with time increased by 0.29 sec, which patient attributes to fatigue being that testing was completed later in the day than his usual PT sessions.  We reviewed fall prevention precautions, reinforcing need for clear, well lit pathways, use of proper footwear and assistive devices - no concerns identified other than patient noting that sometimes his dog will leave her toys in the middle of the floor.  Remainder of session focused on reintroduction of standing PWR! Moves along with coordinated stepping to help with turning in tight spaces.  Good tolerance for standing PWR! Moves, however step and turn limited by patient report of onset of dizziness.  Annette Stable will continue to benefit from skilled PT to address his ongoing  posture, strength, coordination and balance deficits to improve mobility and activity tolerance with decreased risk for falls.   OBJECTIVE IMPAIRMENTS: Abnormal gait, decreased activity tolerance, decreased balance, decreased coordination, decreased endurance, decreased mobility, difficulty walking, decreased strength, decreased safety awareness, impaired perceived functional ability, impaired flexibility, improper body mechanics, and postural dysfunction.   ACTIVITY LIMITATIONS: carrying, lifting, standing, squatting, stairs, transfers, bed mobility, reach over head, locomotion level, and caring for others  PARTICIPATION LIMITATIONS: meal prep, cleaning, laundry, driving, shopping, and community activity  PERSONAL FACTORS: Age, Fitness, Past/current experiences, Time since onset of injury/illness/exacerbation, and 3+ comorbidities: PPM 2 sick sinus syndrome/SA node dysfunction & complete heart block, orthostatic hypotension, COPD, OSA, HTN, DM2, Parkinson's Disease, CVA 03/2021, hypothyroidism, PTSD, bipolar I disorder   are also affecting patient's functional outcome.   REHAB POTENTIAL: Good  CLINICAL DECISION MAKING: Evolving/moderate complexity  EVALUATION COMPLEXITY: Moderate   GOALS: Goals reviewed with patient? Yes  SHORT TERM GOALS: Target date: 06/28/2022  Patient will be independent with initial HEP. Baseline:  Goal status: MET  06/16/22   2.  Patient will improve 5x STS time to </= 14 seconds to demonstrate improved functional strength and transfer efficiency.. Baseline: 15.96 sec Goal status: IN PROGRESS  06/27/22 - 16.25 sec (pt attributes slower time due to fatigue later in the day)  3.  Patient will be educated on strategies to decrease risk of falls.  Baseline:  Goal status: MET  06/27/22  LONG TERM GOALS: Target date: 07/19/2022  Patient will be independent with ongoing/advanced HEP for self-management at home incorporating PWR! Moves as indicated .  Baseline:  Goal  status: IN PROGRESS  2.  Patient will be able to ambulate at least 800 ft with LRAD including outdoor and unlevel surfaces, independently with no LOB, for improved community gait. .  Baseline:  Goal status: IN PROGRESS  3.  Patient will be able to step up/down curb safely with LRAD for safety with community ambulation.  Baseline:  Goal status: IN PROGRESS   4.  Patient will demonstrate at least 25/30 on FGA to improve gait stability and reduce risk for falls. (MCID = 4 points) Baseline: 17/30 Goal status: IN PROGRESS  5.  Patient will improve Berg score to >/= 52/56 to improve safety and stability with ADLs in standing and reduce risk for falls. (MCID = 8 points)   Baseline: TBD Goal status: IN PROGRESS  6.  Patient will report >/= 85% on ABC scale to demonstrate improved balance confidence and decreased risk for falls. Baseline: 1240 / 1600 = 77.5 % Goal status: IN PROGRESS  7. Patient will verbalize understanding of local Parkinson's disease community resources, including community fitness post d/c. Baseline:  Goal status: IN PROGRESS   PLAN:  PT FREQUENCY: 2x/week  PT DURATION: 6 weeks  PLANNED INTERVENTIONS: Therapeutic exercises, Therapeutic activity, Neuromuscular re-education, Balance training, Gait training, Patient/Family education, Self Care, Joint mobilization, Stair training, DME instructions, Manual therapy, and Re-evaluation  PLAN FOR NEXT SESSION:  re-assess 5xSTS at beginning of session before fatigued; progress postural and lumbopelvic/LE flexibility and strengthening - review/update HEP as needed; progress PWR! Moves   Svensen, [place] 06/27/2022, 5:03 PM

## 2022-06-29 ENCOUNTER — Ambulatory Visit: Payer: Medicare Other

## 2022-06-29 DIAGNOSIS — R2681 Unsteadiness on feet: Secondary | ICD-10-CM

## 2022-06-29 DIAGNOSIS — M6281 Muscle weakness (generalized): Secondary | ICD-10-CM

## 2022-06-29 DIAGNOSIS — R2689 Other abnormalities of gait and mobility: Secondary | ICD-10-CM | POA: Diagnosis not present

## 2022-06-29 DIAGNOSIS — R293 Abnormal posture: Secondary | ICD-10-CM

## 2022-06-29 NOTE — Therapy (Signed)
OUTPATIENT PHYSICAL THERAPY TREATMENT   Patient Name: Jassiah Liberti MRN: [id number] DOB:[birth year], 89 y.o., male Today's Date: 06/29/2022   END OF SESSION:  PT End of Session - 06/29/22 1524     Visit Number 7    Date for PT Re-Evaluation 07/19/22    Authorization Type Medicare & [employer]    PT Start Time [phone]    PT Stop Time 1530    PT Time Calculation (min) 42 min    Activity Tolerance Patient tolerated treatment well    Behavior During Therapy WFL for tasks assessed/performed               Past Medical History:  Diagnosis Date   Bipolar 1 disorder (HCC)    COPD (chronic obstructive pulmonary disease) (HCC)    Depression    First degree AV block    Hypertension    pt denies but was in medical record   Hypoglycemia, unspecified    Myalgia and myositis, unspecified    Obesity    Pneumonia 1970   Skin cancer (melanoma) (HCC)    Thyroid disease    Unspecified hypothyroidism    UPJ obstruction, congenital    Past Surgical History:  Procedure Laterality Date   APPENDECTOMY  1939   malignant melanoma removed from right forehead  2008   PERMANENT PACEMAKER INSERTION N/A 06/19/2013   Procedure: PERMANENT PACEMAKER INSERTION;  Surgeon: Marinus Maw, MD;  Location: [facility] CATH LAB;  Service: Cardiovascular;  Laterality: N/A;   right big toe  surgery  2009   TRANSURETHRAL RESECTION OF PROSTATE  02/04/2011   Procedure: TRANSURETHRAL RESECTION OF THE PROSTATE (TURP);  Surgeon: Crecencio Mc, MD;  Location: [facility] ORS;  Service: Urology;  Laterality: N/A;   URETHRA SURGERY  2010   reconstruction   Patient Active Problem List   Diagnosis Date Noted   Hyperlipidemia 03/15/2022   TIA (transient ischemic attack) 03/14/2022   History of neoplasm 04/12/2021   Melanocytic nevi of trunk 04/12/2021   Other seborrheic keratosis 04/12/2021   Cerebral embolism with cerebral infarction 03/31/2021   Left leg weakness 03/30/2021   Chronic post-traumatic stress disorder  12/05/2017   Bipolar I disorder, current or most recent episode depressed, in partial remission (HCC) 12/05/2017   CHB (complete heart block) (HCC) 01/27/2017   HTN (hypertension) 01/27/2017   Hypothyroidism 10/11/2016   Parkinson disease 09/08/2016   Chronic osteoarthritis 03/20/2015   Bipolar 1 disorder (HCC) 03/20/2015   Tremor 03/20/2015   Pacemaker 09/24/2013   SSS (sick sinus syndrome) (HCC) 06/19/2013   First degree AV block 04/09/2013   Allergic rhinitis 03/17/2013   Obstructive sleep apnea 12/18/2006   COPD mixed type (HCC) 12/18/2006   History of malignant melanoma of skin 12/18/2006   Diabetes mellitus (HCC) 12/18/2006    PCP: Olive Bass, FNP   REFERRING PROVIDER: Vladimir Faster, DO   REFERRING DIAG:  G45.9 (ICD-10-CM) - TIA (transient ischemic attack)  G20.A1 (ICD-10-CM) - Parkinson's disease without dyskinesia or fluctuating manifestations   THERAPY DIAG:  Other abnormalities of gait and mobility  Unsteadiness on feet  Muscle weakness (generalized)  Abnormal posture  RATIONALE FOR EVALUATION AND TREATMENT: Rehabilitation  ONSET DATE: 03/14/22 - TIA; Initial PD diagnosis >15 yrs ago  NEXT MD VISIT: 10/26/22   SUBJECTIVE:  SUBJECTIVE STATEMENT: Pt denies any pain, energy is pretty good today.  PAIN: Are you having pain? No  PERTINENT HISTORY:  PPM 2 sick sinus syndrome/SA node dysfunction & complete heart block, orthostatic hypotension, COPD, OSA, HTN, DM2, Parkinson's Disease, CVA 03/2021, hypothyroidism, PTSD, bipolar I disorder   PRECAUTIONS: Fall and ICD/Pacemaker  WEIGHT BEARING RESTRICTIONS: No  FALLS:  Has patient fallen in last 6 months? No  LIVING ENVIRONMENT: Lives with: lives alone and with his dog Lives in: Other Independent  living facility (3rd floor) Stairs:  Elevator Has following equipment at home: Single point cane, Environmental consultant - 4 wheeled, and hiking pole  OCCUPATION: Retired  PLOF: Independent and Leisure: being outdoors - walking his dog, and stays active most of the day  PATIENT GOALS: "Improve my hand-eye coordination and especially balance."   OBJECTIVE: (objective measures completed at initial evaluation unless otherwise dated)  DIAGNOSTIC FINDINGS:  03/14/22 & 03/15/22 - CT Head w/o contrast:  IMPRESSION: 1. No evidence of acute intracranial abnormality. 2. Chronic small vessel ischemic disease and cerebral atrophy. 03/14/22 - CT Angio Head & Neck: IMPRESSION: 1. Negative CTA for large vessel occlusion or other emergent finding. 2. Mild for age atheromatous change about the carotid bifurcations and carotid siphons without hemodynamically significant stenosis.  COGNITION: Overall cognitive status: Within functional limits for tasks assessed   SENSATION: WFL Except neuropathy in his feet - most notable at night  COORDINATION: Gross motor WFL B UE/LE  MUSCLE LENGTH: Hamstrings: mod tight B ITB: mod tight L, mild tight R Piriformis: mild tight B Hip flexors: mod tight B Quads: mod tight B Heelcord: mod tight L, mild tight R  POSTURE:  rounded shoulders, forward head, and flexed trunk   LOWER EXTREMITY ROM:    Grossly WFL  LOWER EXTREMITY MMT:    MMT Right Eval Left Eval  Hip flexion 4 4  Hip extension 4 4  Hip abduction 4+ 4+  Hip adduction 4+ 4+  Hip internal rotation 4+ 4+  Hip external rotation 4 4  Knee flexion 5 5  Knee extension 5 5  Ankle dorsiflexion 4+ 4+  Ankle plantarflexion    Ankle inversion    Ankle eversion    (Blank rows = not tested)  BED MOBILITY:  Sit to supine Complete Independence Supine to sit Complete Independence Rolling to Right Complete Independence  TRANSFERS: Assistive device utilized: None  Sit to stand: Complete Independence Stand to  sit: Complete Independence Chair to chair: Modified independence Floor:  NT  GAIT: Distance walked: 100 ft Assistive device utilized: Environmental consultant - 4 wheeled and None Level of assistance: Modified independence and SBA Gait pattern: step through pattern, decreased arm swing- Right, decreased arm swing- Left, and trunk flexed Comments: more pronounced fwd head, flexed trunk posture noted than on previous PT episodes  RAMP: Level of Assistance:  NT Assistive device utilized:  NT Ramp Comments:   CURB:  Level of Assistance:  NT Assistive device utilized:  NT Curb Comments:   STAIRS:  Level of Assistance: SBA  Stair Negotiation Technique: Alternating Pattern  with Single Rail on Right  Number of Stairs: 14   Height of Stairs: 7"  Comments: Pt ascends stairs with weight mostly through ball of foot on step with heel hanging over edge of step causing slight drop and posterior shift at times, but no LOB due to grip on railing  FUNCTIONAL TESTS:  5 times sit to stand: 15.96 sec Timed up and go (TUG): Normal = 12.56 sec with rollator,  9.09 sec w/o AD; Manual = 9.32 sec; Cognitive = 14.34 sec 10 meter walk test: 7.97 sec with rollator, 7.59 sec w/o AD; Gait speed = 4.12 ft/sec with rollator, 4.32 ft/sec w/o AD,  Berg Balance Scale: 43/56; 37-45 = significant risk for falls (>80%)  (06/09/22) Functional gait assessment: 17/30; < 19 = high risk fall  06/27/22: 5xSTS = 16.25 sec  Scores as of discharge from last PT episode - December 2023: 5 times sit to stand: 12.13 sec Timed up and go (TUG): Normal = 10.45 sec; Manual = 11.45 sec; Cognitive = 15.15 sec 10 meter walk test: 7.5 sec (no AD); Gait speed = 4.37 ft/sec Berg: 53/56; 52-55 lower (> 25%) fall risk Functional gait assessment: 27/30; 25-28 = low risk fall   PATIENT SURVEYS:  ABC scale 1240 / 1600 = 77.5 %   TODAY'S TREATMENT:             06/29/22 THERAPEUTIC EXERCISE: to improve flexibility, strength and mobility.  Verbal and  tactile cues throughout for technique. NuStep - L5 x 5 min (UE/LE to promote improved reciprocal movement patterns) 5xSTS- 15 seconds Seated arm raise with trunk extension x 10  Seated rotation in chair x 10  Standing wall slides for trunk extension 2x10 Standing staggered stance with wall slide x 10 R/L Seated row 15# 3x10 high grips Standing hip abduction and extension 3# 2x10 06/27/22 THERAPEUTIC EXERCISE: to improve flexibility, strength and mobility.  Verbal and tactile cues throughout for technique. NuStep - L5 x 6 min (UE/LE to promote improved reciprocal movement patterns)  THERAPEUTIC ACTIVITIES: 5xSTS = 16.25 sec  NEUROMUSCULAR RE-EDUCATION: To improve posture, coordination, reduce fall risk, amplitude of movement, speed of movement to reduce bradykinesia, and reduce rigidity. Supine PWR! Moves - Up, [employer], Twist & Step - x 10 PWR! Sit to Stand x 10 Standing PWR! Moves - Up, [employer], Twist & Step - x 10 PWR! Step & Turn (1/4 turn) R/L x 4 - discontinued d/t c/o dizziness  SELF CARE: Reviewed the "Check for Safety - Home Fall Prevention Checklist for Older Adults" to help identify fall risk hazards in the home along with strategies to reduce fall risk at home - pt notes only concern being his dog's toys sometimes being left in his walkways.   06/21/22 THERAPEUTIC EXERCISE: to improve flexibility, strength and mobility.  Verbal and tactile cues throughout for technique. NuStep - L5 x 6 min Seated shoulder squeeze x 10 Seated trunk twist x 10  Seated horizontal abduction red TB x 10  Seated alt diagonals red TB x 10  Standing bird dog 2x10 bil Standing hip extension 2# bil 2x10 Standing HS curls 2# 2x10 Stanidng rows 2x10 red TB Standing shld ext red TB 2x10 Standing shoulder ER red TB 2x10 Standing pallof press gtb x 10    06/16/22 THERAPEUTIC EXERCISE: to improve flexibility, strength and mobility.  Verbal and tactile cues throughout for technique. NuStep - L5 x 5  min  NEUROMUSCULAR RE-EDUCATION: To improve posture, balance, coordination, reduce fall risk, amplitude of movement, speed of movement to reduce bradykinesia, and reduce rigidity. Seated PWR! Moves - Up, Rock, Twist & Step - x 10  Supine PWR! Moves - Up, [employer], Twist & Step - x 10 PWR! Sit to Stand x 10   PATIENT EDUCATION:  Education details: HEP progression - standing PWR! Moves   Person educated: Patient Education method: Explanation, Demonstration, Verbal cues, and Handouts Education comprehension: verbalized understanding, returned demonstration, and needs further education  HOME EXERCISE PROGRAM: Access Code: [email or web address] URL: [email or web address] Date: 06/09/2022 Prepared by: Glenetta Hew  Exercises - Seated Hamstring Stretch  - 2 x daily - 7 x weekly - 3 reps - 30 sec hold - Seated Figure 4 Piriformis Stretch  - 2 x daily - 7 x weekly - 3 reps - 30 sec hold - Standing Hip Flexor Stretch  - 2 x daily - 7 x weekly - 3 reps - 30 sec hold - Standing ITB Stretch  - 2 x daily - 7 x weekly - 3 reps - 30 sec hold - Marching with Resistance  - 1 x daily - 7 x weekly - 2 sets - 10 reps - 3 sec hold - Standing Hip Extension with Resistance at Ankles and Counter Support  - 1 x daily - 7 x weekly - 2 sets - 10 reps - 3 sec hold  PWR! Moves - Seated - Supine - Standing  Access Code: [email or web address] from previous episode   ASSESSMENT:  CLINICAL IMPRESSION: Pt improved 5xSTS score today to 15 seconds. Continued working on postural exercises and general strengthening to improve function. Pt was able to complete all interventions. Cues for form given throughout session to target the correct muscles. He is progressing toward goals.  OBJECTIVE IMPAIRMENTS: Abnormal gait, decreased activity tolerance, decreased balance, decreased coordination, decreased endurance, decreased mobility, difficulty walking, decreased strength, decreased safety awareness, impaired perceived functional  ability, impaired flexibility, improper body mechanics, and postural dysfunction.   ACTIVITY LIMITATIONS: carrying, lifting, standing, squatting, stairs, transfers, bed mobility, reach over head, locomotion level, and caring for others  PARTICIPATION LIMITATIONS: meal prep, cleaning, laundry, driving, shopping, and community activity  PERSONAL FACTORS: Age, Fitness, Past/current experiences, Time since onset of injury/illness/exacerbation, and 3+ comorbidities: PPM 2 sick sinus syndrome/SA node dysfunction & complete heart block, orthostatic hypotension, COPD, OSA, HTN, DM2, Parkinson's Disease, CVA 03/2021, hypothyroidism, PTSD, bipolar I disorder   are also affecting patient's functional outcome.   REHAB POTENTIAL: Good  CLINICAL DECISION MAKING: Evolving/moderate complexity  EVALUATION COMPLEXITY: Moderate   GOALS: Goals reviewed with patient? Yes  SHORT TERM GOALS: Target date: 06/28/2022  Patient will be independent with initial HEP. Baseline:  Goal status: MET  06/16/22   2.  Patient will improve 5x STS time to </= 14 seconds to demonstrate improved functional strength and transfer efficiency.. Baseline: 15.96 sec Goal status: IN PROGRESS  06/29/22- 15 seconds  3.  Patient will be educated on strategies to decrease risk of falls.  Baseline:  Goal status: MET  06/27/22  LONG TERM GOALS: Target date: 07/19/2022  Patient will be independent with ongoing/advanced HEP for self-management at home incorporating PWR! Moves as indicated .  Baseline:  Goal status: IN PROGRESS  2.  Patient will be able to ambulate at least 800 ft with LRAD including outdoor and unlevel surfaces, independently with no LOB, for improved community gait. .  Baseline:  Goal status: IN PROGRESS  3.  Patient will be able to step up/down curb safely with LRAD for safety with community ambulation.  Baseline:  Goal status: IN PROGRESS   4.  Patient will demonstrate at least 25/30 on FGA to improve gait  stability and reduce risk for falls. (MCID = 4 points) Baseline: 17/30 Goal status: IN PROGRESS  5.  Patient will improve Berg score to >/= 52/56 to improve safety and stability with ADLs in standing and reduce risk for falls. (MCID = 8 points)   Baseline: TBD Goal status: IN PROGRESS  6.  Patient will report >/= 85% on ABC scale to demonstrate improved balance confidence and decreased risk for falls. Baseline: 1240 / 1600 = 77.5 % Goal status: IN PROGRESS  7. Patient will verbalize understanding of local Parkinson's disease community resources, including community fitness post d/c. Baseline:  Goal status: IN PROGRESS   PLAN:  PT FREQUENCY: 2x/week  PT DURATION: 6 weeks  PLANNED INTERVENTIONS: Therapeutic exercises, Therapeutic activity, Neuromuscular re-education, Balance training, Gait training, Patient/Family education, Self Care, Joint mobilization, Stair training, DME instructions, Manual therapy, and Re-evaluation  PLAN FOR NEXT SESSION: progress postural and lumbopelvic/LE flexibility and strengthening - review/update HEP as needed; progress PWR! Moves   Darleene Cleaver, PTA 06/29/2022, 3:33 PM

## 2022-07-04 ENCOUNTER — Encounter: Payer: Medicare Other | Admitting: Physical Therapy

## 2022-07-05 ENCOUNTER — Ambulatory Visit: Payer: Medicare Other | Admitting: Physical Therapy

## 2022-07-05 ENCOUNTER — Encounter: Payer: Self-pay | Admitting: Physical Therapy

## 2022-07-05 DIAGNOSIS — R2681 Unsteadiness on feet: Secondary | ICD-10-CM

## 2022-07-05 DIAGNOSIS — R293 Abnormal posture: Secondary | ICD-10-CM

## 2022-07-05 DIAGNOSIS — R2689 Other abnormalities of gait and mobility: Secondary | ICD-10-CM

## 2022-07-05 DIAGNOSIS — M6281 Muscle weakness (generalized): Secondary | ICD-10-CM

## 2022-07-05 NOTE — Therapy (Signed)
OUTPATIENT PHYSICAL THERAPY TREATMENT   Patient Name: Victor Osborne MRN: [id number] DOB:06/01/1932, 89 y.o., male Today's Date: 07/05/2022   END OF SESSION:  PT End of Session - 07/05/22 1447     Visit Number 8    Date for PT Re-Evaluation 07/19/22    Authorization Type Medicare & [employer]    PT Start Time 1448    PT Stop Time 1537    PT Time Calculation (min) 49 min    Activity Tolerance Patient tolerated treatment well    Behavior During Therapy WFL for tasks assessed/performed               Past Medical History:  Diagnosis Date   Bipolar 1 disorder (HCC)    COPD (chronic obstructive pulmonary disease) (HCC)    Depression    First degree AV block    Hypertension    pt denies but was in medical record   Hypoglycemia, unspecified    Myalgia and myositis, unspecified    Obesity    Pneumonia 1970   Skin cancer (melanoma) (HCC)    Thyroid disease    Unspecified hypothyroidism    UPJ obstruction, congenital    Past Surgical History:  Procedure Laterality Date   APPENDECTOMY  1939   malignant melanoma removed from right forehead  2008   PERMANENT PACEMAKER INSERTION N/A 06/19/2013   Procedure: PERMANENT PACEMAKER INSERTION;  Surgeon: Marinus Maw, MD;  Location: [facility] CATH LAB;  Service: Cardiovascular;  Laterality: N/A;   right big toe  surgery  2009   TRANSURETHRAL RESECTION OF PROSTATE  02/04/2011   Procedure: TRANSURETHRAL RESECTION OF THE PROSTATE (TURP);  Surgeon: Crecencio Mc, MD;  Location: [facility] ORS;  Service: Urology;  Laterality: N/A;   URETHRA SURGERY  2010   reconstruction   Patient Active Problem List   Diagnosis Date Noted   Hyperlipidemia 03/15/2022   TIA (transient ischemic attack) 03/14/2022   History of neoplasm 04/12/2021   Melanocytic nevi of trunk 04/12/2021   Other seborrheic keratosis 04/12/2021   Cerebral embolism with cerebral infarction 03/31/2021   Left leg weakness 03/30/2021   Chronic post-traumatic stress disorder  12/05/2017   Bipolar I disorder, current or most recent episode depressed, in partial remission (HCC) 12/05/2017   CHB (complete heart block) (HCC) 01/27/2017   HTN (hypertension) 01/27/2017   Hypothyroidism 10/11/2016   Parkinson disease 09/08/2016   Chronic osteoarthritis 03/20/2015   Bipolar 1 disorder (HCC) 03/20/2015   Tremor 03/20/2015   Pacemaker 09/24/2013   SSS (sick sinus syndrome) (HCC) 06/19/2013   First degree AV block 04/09/2013   Allergic rhinitis 03/17/2013   Obstructive sleep apnea 12/18/2006   COPD mixed type (HCC) 12/18/2006   History of malignant melanoma of skin 12/18/2006   Diabetes mellitus (HCC) 12/18/2006    PCP: Olive Bass, FNP   REFERRING PROVIDER: Vladimir Faster, DO   REFERRING DIAG:  G45.9 (ICD-10-CM) - TIA (transient ischemic attack)  G20.A1 (ICD-10-CM) - Parkinson's disease without dyskinesia or fluctuating manifestations   THERAPY DIAG:  Other abnormalities of gait and mobility  Unsteadiness on feet  Muscle weakness (generalized)  Abnormal posture  RATIONALE FOR EVALUATION AND TREATMENT: Rehabilitation  ONSET DATE: 03/14/22 - TIA; Initial PD diagnosis >15 yrs ago  NEXT MD VISIT: 10/26/22   SUBJECTIVE:  SUBJECTIVE STATEMENT: Pt reports he is sleeping better by wearing his compression socks to bed which seems to help reduce his neuropathy symptoms.  PAIN: Are you having pain? No  PERTINENT HISTORY:  PPM 2 sick sinus syndrome/SA node dysfunction & complete heart block, orthostatic hypotension, COPD, OSA, HTN, DM2, Parkinson's Disease, CVA 03/2021, hypothyroidism, PTSD, bipolar I disorder   PRECAUTIONS: Fall and ICD/Pacemaker  WEIGHT BEARING RESTRICTIONS: No  FALLS:  Has patient fallen in last 6 months? No  LIVING  ENVIRONMENT: Lives with: lives alone and with his dog Lives in: Other Independent living facility (3rd floor) Stairs:  Elevator Has following equipment at home: Single point cane, Environmental consultant - 4 wheeled, and hiking pole  OCCUPATION: Retired  PLOF: Independent and Leisure: being outdoors - walking his dog, and stays active most of the day  PATIENT GOALS: "Improve my hand-eye coordination and especially balance."   OBJECTIVE: (objective measures completed at initial evaluation unless otherwise dated)  DIAGNOSTIC FINDINGS:  03/14/22 & 03/15/22 - CT Head w/o contrast:  IMPRESSION: 1. No evidence of acute intracranial abnormality. 2. Chronic small vessel ischemic disease and cerebral atrophy. 03/14/22 - CT Angio Head & Neck: IMPRESSION: 1. Negative CTA for large vessel occlusion or other emergent finding. 2. Mild for age atheromatous change about the carotid bifurcations and carotid siphons without hemodynamically significant stenosis.  COGNITION: Overall cognitive status: Within functional limits for tasks assessed   SENSATION: WFL Except neuropathy in his feet - most notable at night  COORDINATION: Gross motor WFL B UE/LE  MUSCLE LENGTH: Hamstrings: mod tight B ITB: mod tight L, mild tight R Piriformis: mild tight B Hip flexors: mod tight B Quads: mod tight B Heelcord: mod tight L, mild tight R  POSTURE:  rounded shoulders, forward head, and flexed trunk   LOWER EXTREMITY ROM:    Grossly WFL  LOWER EXTREMITY MMT:    MMT Right Eval Left Eval  Hip flexion 4 4  Hip extension 4 4  Hip abduction 4+ 4+  Hip adduction 4+ 4+  Hip internal rotation 4+ 4+  Hip external rotation 4 4  Knee flexion 5 5  Knee extension 5 5  Ankle dorsiflexion 4+ 4+  Ankle plantarflexion    Ankle inversion    Ankle eversion    (Blank rows = not tested)  BED MOBILITY:  Sit to supine Complete Independence Supine to sit Complete Independence Rolling to Right Complete  Independence  TRANSFERS: Assistive device utilized: None  Sit to stand: Complete Independence Stand to sit: Complete Independence Chair to chair: Modified independence Floor:  NT  GAIT: Distance walked: 100 ft Assistive device utilized: Environmental consultant - 4 wheeled and None Level of assistance: Modified independence and SBA Gait pattern: step through pattern, decreased arm swing- Right, decreased arm swing- Left, and trunk flexed Comments: more pronounced fwd head, flexed trunk posture noted than on previous PT episodes  RAMP: Level of Assistance:  NT Assistive device utilized:  NT Ramp Comments:   CURB:  Level of Assistance:  NT Assistive device utilized:  NT Curb Comments:   STAIRS:  Level of Assistance: SBA  Stair Negotiation Technique: Alternating Pattern  with Single Rail on Right  Number of Stairs: 14   Height of Stairs: 7"  Comments: Pt ascends stairs with weight mostly through ball of foot on step with heel hanging over edge of step causing slight drop and posterior shift at times, but no LOB due to grip on railing  FUNCTIONAL TESTS:  5 times sit to stand: 15.96  sec Timed up and go (TUG): Normal = 12.56 sec with rollator, 9.09 sec w/o AD; Manual = 9.32 sec; Cognitive = 14.34 sec 10 meter walk test: 7.97 sec with rollator, 7.59 sec w/o AD; Gait speed = 4.12 ft/sec with rollator, 4.32 ft/sec w/o AD,  Berg Balance Scale: 43/56; 37-45 = significant risk for falls (>80%)  (06/09/22) Functional gait assessment: 17/30; < 19 = high risk fall  06/27/22: 5xSTS = 16.25 sec  06/29/22: 5xSTS = 15 sec  Scores as of discharge from last PT episode - December 2023: 5 times sit to stand: 12.13 sec Timed up and go (TUG): Normal = 10.45 sec; Manual = 11.45 sec; Cognitive = 15.15 sec 10 meter walk test: 7.5 sec (no AD); Gait speed = 4.37 ft/sec Berg: 53/56; 52-55 lower (> 25%) fall risk Functional gait assessment: 27/30; 25-28 = low risk fall   PATIENT SURVEYS:  ABC scale 1240 / 1600 =  77.5 %   TODAY'S TREATMENT:              07/05/22 THERAPEUTIC EXERCISE: to improve flexibility, strength and mobility.  Verbal and tactile cues throughout for technique. NuStep - L5 x 6 min (UE/LE to promote improved reciprocal movement patterns)  NEUROMUSCULAR RE-EDUCATION: To improve posture, balance, coordination, amplitude of movement, speed of movement to reduce bradykinesia, and reduce rigidity. Prone PWR! Moves - Up, Rock, Twist & Step x 10 - modified with pillow under abdomen for all but PWR! Twist Supine PWR! Moves - Up, [employer], Twist & Step x 10 PWR! Step back x 5 - discontinued d/t c/o dizziness  THERAPEUTIC ACTIVITIES:  BP checks (after c/o dizziness): Sitting 108/60 Standing 92/48, 88/44  SELF CARE: Reviewed strategies to help mitigate symptoms of orthostatic hypotension including: Slow transitions with pauses to ensure any dizziness clears Adequate hydration with limited caffeinated beverages Wearing compression stockings when up and moving    06/29/22 THERAPEUTIC EXERCISE: to improve flexibility, strength and mobility.  Verbal and tactile cues throughout for technique. NuStep - L5 x 5 min (UE/LE to promote improved reciprocal movement patterns) 5xSTS = 15 seconds Seated arm raise with trunk extension x 10  Seated rotation in chair x 10  Standing wall slides for trunk extension 2x10 Standing staggered stance with wall slide x 10 R/L Seated row 15# 3x10 high grips Standing hip abduction and extension 3# 2x10   06/27/22 THERAPEUTIC EXERCISE: to improve flexibility, strength and mobility.  Verbal and tactile cues throughout for technique. NuStep - L5 x 6 min (UE/LE to promote improved reciprocal movement patterns)  THERAPEUTIC ACTIVITIES: 5xSTS = 16.25 sec  NEUROMUSCULAR RE-EDUCATION: To improve posture, coordination, reduce fall risk, amplitude of movement, speed of movement to reduce bradykinesia, and reduce rigidity. Supine PWR! Moves - Up, [employer], Twist & Step x  10 PWR! Sit to Stand x 10 Standing PWR! Moves - Up, [employer], Twist & Step x 10 PWR! Step & Turn (1/4 turn) R/L x 4 - discontinued d/t c/o dizziness  SELF CARE: Reviewed the "Check for Safety - Home Fall Prevention Checklist for Older Adults" to help identify fall risk hazards in the home along with strategies to reduce fall risk at home - pt notes only concern being his dog's toys sometimes being left in his walkways.   PATIENT EDUCATION:  Education details: HEP progression - standing PWR! Moves   Person educated: Patient Education method: Explanation, Demonstration, Verbal cues, and Handouts Education comprehension: verbalized understanding, returned demonstration, and needs further education  HOME EXERCISE PROGRAM: Access Code: [email or web address]  URL: [email or web address] Date: 07/05/2022 Prepared by: Glenetta Hew  Exercises - Seated Hamstring Stretch  - 2 x daily - 7 x weekly - 3 reps - 30 sec hold - Seated Figure 4 Piriformis Stretch  - 2 x daily - 7 x weekly - 3 reps - 30 sec hold - Standing Hip Flexor Stretch  - 2 x daily - 7 x weekly - 3 reps - 30 sec hold - Standing ITB Stretch  - 2 x daily - 7 x weekly - 3 reps - 30 sec hold - Marching with Resistance  - 1 x daily - 7 x weekly - 2 sets - 10 reps - 3 sec hold - Standing Hip Extension with Resistance at Ankles and Counter Support  - 1 x daily - 7 x weekly - 2 sets - 10 reps - 3 sec hold  Patient Education - Postural Hypotension - Orthostatic Hypotension  PWR! Moves - Seated - Supine - Standing  Access Code: [email or web address] from previous episode   ASSESSMENT:  CLINICAL IMPRESSION: Petra Kuba" notes he has been sleeping better by wearing his compression socks to help with his neuropathy, however does not typically wear them during the day due to not wanting to look like he is wearing "knickers". Progressed PWR! Moves introducing moves in prone, modifying positioning with pillow under hips/abdomen to reduce degree of  lumbar extension when in POE during moves with pt noting better tolerance. Prone PWR! Moves followed but review of supine PWR! Moves as he notes these seem to help him with rolling and repositioning in bed. On attempt to transition to standing activities, including seated interim where he consumed an 8 oz bottle of water, he experienced significant dizziness in standing preventing him from performing the desired standing activities. BP checked in sitting and then again in standing with pt demonstrating a symptomatic orthostatic drop in BP, therefore remainder of session spent reviewing precautions and strategies to control/minimize impact of orthostatic/postural hypotension with positional changes.  OBJECTIVE IMPAIRMENTS: Abnormal gait, decreased activity tolerance, decreased balance, decreased coordination, decreased endurance, decreased mobility, difficulty walking, decreased strength, decreased safety awareness, impaired perceived functional ability, impaired flexibility, improper body mechanics, and postural dysfunction.   ACTIVITY LIMITATIONS: carrying, lifting, standing, squatting, stairs, transfers, bed mobility, reach over head, locomotion level, and caring for others  PARTICIPATION LIMITATIONS: meal prep, cleaning, laundry, driving, shopping, and community activity  PERSONAL FACTORS: Age, Fitness, Past/current experiences, Time since onset of injury/illness/exacerbation, and 3+ comorbidities: PPM 2 sick sinus syndrome/SA node dysfunction & complete heart block, orthostatic hypotension, COPD, OSA, HTN, DM2, Parkinson's Disease, CVA 03/2021, hypothyroidism, PTSD, bipolar I disorder   are also affecting patient's functional outcome.   REHAB POTENTIAL: Good  CLINICAL DECISION MAKING: Evolving/moderate complexity  EVALUATION COMPLEXITY: Moderate   GOALS: Goals reviewed with patient? Yes  SHORT TERM GOALS: Target date: 06/28/2022  Patient will be independent with initial HEP. Baseline:   Goal status: MET  06/16/22   2.  Patient will improve 5x STS time to </= 14 seconds to demonstrate improved functional strength and transfer efficiency.. Baseline: 15.96 sec Goal status: IN PROGRESS  06/29/22 - 15 seconds  3.  Patient will be educated on strategies to decrease risk of falls.  Baseline:  Goal status: MET  06/27/22  LONG TERM GOALS: Target date: 07/19/2022  Patient will be independent with ongoing/advanced HEP for self-management at home incorporating PWR! Moves as indicated .  Baseline:  Goal status: IN PROGRESS  2.  Patient will be able to ambulate at least  800 ft with LRAD including outdoor and unlevel surfaces, independently with no LOB, for improved community gait. .  Baseline:  Goal status: IN PROGRESS  3.  Patient will be able to step up/down curb safely with LRAD for safety with community ambulation.  Baseline:  Goal status: IN PROGRESS   4.  Patient will demonstrate at least 25/30 on FGA to improve gait stability and reduce risk for falls. (MCID = 4 points) Baseline: 17/30 Goal status: IN PROGRESS  5.  Patient will improve Berg score to >/= 52/56 to improve safety and stability with ADLs in standing and reduce risk for falls. (MCID = 8 points)   Baseline: TBD Goal status: IN PROGRESS  6.  Patient will report >/= 85% on ABC scale to demonstrate improved balance confidence and decreased risk for falls. Baseline: 1240 / 1600 = 77.5 % Goal status: IN PROGRESS  7. Patient will verbalize understanding of local Parkinson's disease community resources, including community fitness post d/c. Baseline:  Goal status: IN PROGRESS   PLAN:  PT FREQUENCY: 2x/week  PT DURATION: 6 weeks  PLANNED INTERVENTIONS: Therapeutic exercises, Therapeutic activity, Neuromuscular re-education, Balance training, Gait training, Patient/Family education, Self Care, Joint mobilization, Stair training, DME instructions, Manual therapy, and Re-evaluation  PLAN FOR NEXT SESSION:  re-assess for orthostatic hypotension; progress postural and lumbopelvic/LE flexibility and strengthening - review/update HEP as needed; progress PWR! Moves   [place], [place] 07/05/2022, 6:27 PM

## 2022-07-07 ENCOUNTER — Encounter: Payer: Self-pay | Admitting: Physical Therapy

## 2022-07-07 ENCOUNTER — Ambulatory Visit: Payer: Medicare Other | Admitting: Physical Therapy

## 2022-07-07 DIAGNOSIS — R2689 Other abnormalities of gait and mobility: Secondary | ICD-10-CM

## 2022-07-07 DIAGNOSIS — R293 Abnormal posture: Secondary | ICD-10-CM

## 2022-07-07 DIAGNOSIS — M6281 Muscle weakness (generalized): Secondary | ICD-10-CM

## 2022-07-07 DIAGNOSIS — R2681 Unsteadiness on feet: Secondary | ICD-10-CM

## 2022-07-07 NOTE — Therapy (Signed)
OUTPATIENT PHYSICAL THERAPY TREATMENT   Patient Name: Victor Osborne MRN: [id number] DOB:01-18-1932, 89 y.o., male Today's Date: 07/07/2022   END OF SESSION:  PT End of Session - 07/07/22 1012     Visit Number 9    Date for PT Re-Evaluation 07/19/22    Authorization Type Medicare & [employer]    PT Start Time 1012    PT Stop Time 1052    PT Time Calculation (min) 40 min    Activity Tolerance Patient tolerated treatment well;Treatment limited secondary to medical complications (Comment)   orthostatic/postural hypotension   Behavior During Therapy WFL for tasks assessed/performed               Past Medical History:  Diagnosis Date   Bipolar 1 disorder (HCC)    COPD (chronic obstructive pulmonary disease) (HCC)    Depression    First degree AV block    Hypertension    pt denies but was in medical record   Hypoglycemia, unspecified    Myalgia and myositis, unspecified    Obesity    Pneumonia 1970   Skin cancer (melanoma) (HCC)    Thyroid disease    Unspecified hypothyroidism    UPJ obstruction, congenital    Past Surgical History:  Procedure Laterality Date   APPENDECTOMY  1939   malignant melanoma removed from right forehead  2008   PERMANENT PACEMAKER INSERTION N/A 06/19/2013   Procedure: PERMANENT PACEMAKER INSERTION;  Surgeon: Marinus Maw, MD;  Location: [facility] CATH LAB;  Service: Cardiovascular;  Laterality: N/A;   right big toe  surgery  2009   TRANSURETHRAL RESECTION OF PROSTATE  02/04/2011   Procedure: TRANSURETHRAL RESECTION OF THE PROSTATE (TURP);  Surgeon: Crecencio Mc, MD;  Location: [facility] ORS;  Service: Urology;  Laterality: N/A;   URETHRA SURGERY  2010   reconstruction   Patient Active Problem List   Diagnosis Date Noted   Hyperlipidemia 03/15/2022   TIA (transient ischemic attack) 03/14/2022   History of neoplasm 04/12/2021   Melanocytic nevi of trunk 04/12/2021   Other seborrheic keratosis 04/12/2021   Cerebral embolism with cerebral  infarction 03/31/2021   Left leg weakness 03/30/2021   Chronic post-traumatic stress disorder 12/05/2017   Bipolar I disorder, current or most recent episode depressed, in partial remission (HCC) 12/05/2017   CHB (complete heart block) (HCC) 01/27/2017   HTN (hypertension) 01/27/2017   Hypothyroidism 10/11/2016   Parkinson disease 09/08/2016   Chronic osteoarthritis 03/20/2015   Bipolar 1 disorder (HCC) 03/20/2015   Tremor 03/20/2015   Pacemaker 09/24/2013   SSS (sick sinus syndrome) (HCC) 06/19/2013   First degree AV block 04/09/2013   Allergic rhinitis 03/17/2013   Obstructive sleep apnea 12/18/2006   COPD mixed type (HCC) 12/18/2006   History of malignant melanoma of skin 12/18/2006   Diabetes mellitus (HCC) 12/18/2006    PCP: Olive Bass, FNP   REFERRING PROVIDER: Vladimir Faster, DO   REFERRING DIAG:  G45.9 (ICD-10-CM) - TIA (transient ischemic attack)  G20.A1 (ICD-10-CM) - Parkinson's disease without dyskinesia or fluctuating manifestations   THERAPY DIAG:  Other abnormalities of gait and mobility  Unsteadiness on feet  Muscle weakness (generalized)  Abnormal posture  RATIONALE FOR EVALUATION AND TREATMENT: Rehabilitation  ONSET DATE: 03/14/22 - TIA; Initial PD diagnosis >15 yrs ago  NEXT MD VISIT: 10/26/22   SUBJECTIVE:  SUBJECTIVE STATEMENT: Pt reports he feels like he is going downhill faster than he would like recently. He does note he has been under a lot of stress and not sleeping as well as he should, which is likely making things worse.  PAIN: Are you having pain? No  PERTINENT HISTORY:  PPM 2 sick sinus syndrome/SA node dysfunction & complete heart block, orthostatic hypotension, COPD, OSA, HTN, DM2, Parkinson's Disease, CVA 03/2021,  hypothyroidism, PTSD, bipolar I disorder   PRECAUTIONS: Fall and ICD/Pacemaker  WEIGHT BEARING RESTRICTIONS: No  FALLS:  Has patient fallen in last 6 months? No  LIVING ENVIRONMENT: Lives with: lives alone and with his dog Lives in: Other Independent living facility (3rd floor) Stairs:  Elevator Has following equipment at home: Single point cane, Environmental consultant - 4 wheeled, and hiking pole  OCCUPATION: Retired  PLOF: Independent and Leisure: being outdoors - walking his dog, and stays active most of the day  PATIENT GOALS: "Improve my hand-eye coordination and especially balance."   OBJECTIVE: (objective measures completed at initial evaluation unless otherwise dated)  DIAGNOSTIC FINDINGS:  03/14/22 & 03/15/22 - CT Head w/o contrast:  IMPRESSION: 1. No evidence of acute intracranial abnormality. 2. Chronic small vessel ischemic disease and cerebral atrophy. 03/14/22 - CT Angio Head & Neck: IMPRESSION: 1. Negative CTA for large vessel occlusion or other emergent finding. 2. Mild for age atheromatous change about the carotid bifurcations and carotid siphons without hemodynamically significant stenosis.  COGNITION: Overall cognitive status: Within functional limits for tasks assessed   SENSATION: WFL Except neuropathy in his feet - most notable at night  COORDINATION: Gross motor WFL B UE/LE  MUSCLE LENGTH: Hamstrings: mod tight B ITB: mod tight L, mild tight R Piriformis: mild tight B Hip flexors: mod tight B Quads: mod tight B Heelcord: mod tight L, mild tight R  POSTURE:  rounded shoulders, forward head, and flexed trunk   LOWER EXTREMITY ROM:    Grossly WFL  LOWER EXTREMITY MMT:    MMT Right Eval Left Eval  Hip flexion 4 4  Hip extension 4 4  Hip abduction 4+ 4+  Hip adduction 4+ 4+  Hip internal rotation 4+ 4+  Hip external rotation 4 4  Knee flexion 5 5  Knee extension 5 5  Ankle dorsiflexion 4+ 4+  Ankle plantarflexion    Ankle inversion    Ankle  eversion    (Blank rows = not tested)  BED MOBILITY:  Sit to supine Complete Independence Supine to sit Complete Independence Rolling to Right Complete Independence  TRANSFERS: Assistive device utilized: None  Sit to stand: Complete Independence Stand to sit: Complete Independence Chair to chair: Modified independence Floor:  NT  GAIT: Distance walked: 100 ft Assistive device utilized: Environmental consultant - 4 wheeled and None Level of assistance: Modified independence and SBA Gait pattern: step through pattern, decreased arm swing- Right, decreased arm swing- Left, and trunk flexed Comments: more pronounced fwd head, flexed trunk posture noted than on previous PT episodes  RAMP: Level of Assistance:  NT Assistive device utilized:  NT Ramp Comments:   CURB:  Level of Assistance:  NT Assistive device utilized:  NT Curb Comments:   STAIRS:  Level of Assistance: SBA  Stair Negotiation Technique: Alternating Pattern  with Single Rail on Right  Number of Stairs: 14   Height of Stairs: 7"  Comments: Pt ascends stairs with weight mostly through ball of foot on step with heel hanging over edge of step causing slight drop and posterior shift at  times, but no LOB due to grip on railing  FUNCTIONAL TESTS:  5 times sit to stand: 15.96 sec Timed up and go (TUG): Normal = 12.56 sec with rollator, 9.09 sec w/o AD; Manual = 9.32 sec; Cognitive = 14.34 sec 10 meter walk test: 7.97 sec with rollator, 7.59 sec w/o AD; Gait speed = 4.12 ft/sec with rollator, 4.32 ft/sec w/o AD,  Berg Balance Scale: 43/56; 37-45 = significant risk for falls (>80%)  (06/09/22) Functional gait assessment: 17/30; < 19 = high risk fall  06/27/22: 5xSTS = 16.25 sec  06/29/22: 5xSTS = 15 sec  Scores as of discharge from last PT episode - December 2023: 5 times sit to stand: 12.13 sec Timed up and go (TUG): Normal = 10.45 sec; Manual = 11.45 sec; Cognitive = 15.15 sec 10 meter walk test: 7.5 sec (no AD); Gait speed = 4.37  ft/sec Berg: 53/56; 52-55 lower (> 25%) fall risk Functional gait assessment: 27/30; 25-28 = low risk fall   PATIENT SURVEYS:  ABC scale 1240 / 1600 = 77.5 %   TODAY'S TREATMENT:              07/07/22 THERAPEUTIC EXERCISE: to improve flexibility, strength and mobility.  Verbal and tactile cues throughout for technique. Rec Bike - L3 x 6 min Seated heel-toe raises x 20 Seated alt LAQ x 20 Seated march x 20  NEUROMUSCULAR RE-EDUCATION: To improve posture, proprioception, coordination, amplitude of movement, speed of movement to reduce bradykinesia, and reduce rigidity. Prone PWR! Moves - Up, [employer], [employer] & Step x 10 - modified with hands and torso elevated using hands on seat of chair  THERAPEUTIC ACTIVITIES:  BP checks (after quadruped activities): Sitting 118/64 Standing 76/40 Sitting 106/58 Standing 88/44  SELF CARE: Review of education on strategies to help mitigate symptoms of orthostatic hypotension with handouts provided.   07/05/22 THERAPEUTIC EXERCISE: to improve flexibility, strength and mobility.  Verbal and tactile cues throughout for technique. NuStep - L5 x 6 min (UE/LE to promote improved reciprocal movement patterns)  NEUROMUSCULAR RE-EDUCATION: To improve posture, balance, coordination, amplitude of movement, speed of movement to reduce bradykinesia, and reduce rigidity. Prone PWR! Moves - Up, Rock, Twist & Step x 10 - modified with pillow under abdomen for all but PWR! Twist Supine PWR! Moves - Up, [employer], Twist & Step x 10 PWR! Step back x 5 - discontinued d/t c/o dizziness  THERAPEUTIC ACTIVITIES:  BP checks (after c/o dizziness): Sitting 108/60 Standing 92/48, 88/44  SELF CARE: Reviewed strategies to help mitigate symptoms of orthostatic hypotension including: Slow transitions with pauses to ensure any dizziness clears Adequate hydration with limited caffeinated beverages Wearing compression stockings when up and moving    06/29/22 THERAPEUTIC  EXERCISE: to improve flexibility, strength and mobility.  Verbal and tactile cues throughout for technique. NuStep - L5 x 5 min (UE/LE to promote improved reciprocal movement patterns) 5xSTS = 15 seconds Seated arm raise with trunk extension x 10  Seated rotation in chair x 10  Standing wall slides for trunk extension 2x10 Standing staggered stance with wall slide x 10 R/L Seated row 15# 3x10 high grips Standing hip abduction and extension 3# 2x10   PATIENT EDUCATION:  Education details: transfer safety and orthostatic/postural hypotension   Person educated: Patient Education method: Programmer, multimedia, Verbal cues, and Handouts Education comprehension: verbalized understanding and needs further education  HOME EXERCISE PROGRAM: Access Code: [email or web address] URL: [email or web address] Date: 07/05/2022 Prepared by: Glenetta Hew  Exercises - Seated Hamstring Stretch  - 2 x  daily - 7 x weekly - 3 reps - 30 sec hold - Seated Figure 4 Piriformis Stretch  - 2 x daily - 7 x weekly - 3 reps - 30 sec hold - Standing Hip Flexor Stretch  - 2 x daily - 7 x weekly - 3 reps - 30 sec hold - Standing ITB Stretch  - 2 x daily - 7 x weekly - 3 reps - 30 sec hold - Marching with Resistance  - 1 x daily - 7 x weekly - 2 sets - 10 reps - 3 sec hold - Standing Hip Extension with Resistance at Ankles and Counter Support  - 1 x daily - 7 x weekly - 2 sets - 10 reps - 3 sec hold  Patient Education - Postural Hypotension - Orthostatic Hypotension  PWR! Moves - Seated - Supine - Standing  Access Code: [email or web address] from previous episode   ASSESSMENT:  CLINICAL IMPRESSION: Proceeded with progression of PWR! Moves, introducing All 4's/quadruped moves today with modifications made for hand placement on seat of chair instead of floor due to limited wrist extension. Some fatigue noted during PWR! Moves performance, therefore seated rest break provided following quadruped PWR! Moves. While seated, [employer]" started to note "feeling off" - VS checked with BP HR and O2 sats all WNL. BP reassessed in standing with significant drop observed. Pt provided with 8 oz bottle of water and guided in seated LE exercises to promote better circulatory return. BP still dropping upon return to standing but less symptomatic. Handouts provided for review of orthostatic/postural hypotension education provided last visit. Will plan for formal orthostatic hypotension assessment next visit as part of 10th visit progress note.   OBJECTIVE IMPAIRMENTS: Abnormal gait, decreased activity tolerance, decreased balance, decreased coordination, decreased endurance, decreased mobility, difficulty walking, decreased strength, decreased safety awareness, impaired perceived functional ability, impaired flexibility, improper body mechanics, and postural dysfunction.   ACTIVITY LIMITATIONS: carrying, lifting, standing, squatting, stairs, transfers, bed mobility, reach over head, locomotion level, and caring for others  PARTICIPATION LIMITATIONS: meal prep, cleaning, laundry, driving, shopping, and community activity  PERSONAL FACTORS: Age, Fitness, Past/current experiences, Time since onset of injury/illness/exacerbation, and 3+ comorbidities: PPM 2 sick sinus syndrome/SA node dysfunction & complete heart block, orthostatic hypotension, COPD, OSA, HTN, DM2, Parkinson's Disease, CVA 03/2021, hypothyroidism, PTSD, bipolar I disorder   are also affecting patient's functional outcome.   REHAB POTENTIAL: Good  CLINICAL DECISION MAKING: Evolving/moderate complexity  EVALUATION COMPLEXITY: Moderate   GOALS: Goals reviewed with patient? Yes  SHORT TERM GOALS: Target date: 06/28/2022  Patient will be independent with initial HEP. Baseline:  Goal status: MET  06/16/22   2.  Patient will improve 5x STS time to </= 14 seconds to demonstrate improved functional strength and transfer efficiency.. Baseline: 15.96 sec Goal status: IN  PROGRESS  06/29/22 - 15 seconds  3.  Patient will be educated on strategies to decrease risk of falls.  Baseline:  Goal status: MET  06/27/22  LONG TERM GOALS: Target date: 07/19/2022  Patient will be independent with ongoing/advanced HEP for self-management at home incorporating PWR! Moves as indicated .  Baseline:  Goal status: IN PROGRESS  2.  Patient will be able to ambulate at least 800 ft with LRAD including outdoor and unlevel surfaces, independently with no LOB, for improved community gait. .  Baseline:  Goal status: IN PROGRESS  3.  Patient will be able to step up/down curb safely with LRAD for safety with community ambulation.  Baseline:  Goal  status: IN PROGRESS   4.  Patient will demonstrate at least 25/30 on FGA to improve gait stability and reduce risk for falls. (MCID = 4 points) Baseline: 17/30 Goal status: IN PROGRESS  5.  Patient will improve Berg score to >/= 52/56 to improve safety and stability with ADLs in standing and reduce risk for falls. (MCID = 8 points)   Baseline: TBD Goal status: IN PROGRESS  6.  Patient will report >/= 85% on ABC scale to demonstrate improved balance confidence and decreased risk for falls. Baseline: 1240 / 1600 = 77.5 % Goal status: IN PROGRESS  7. Patient will verbalize understanding of local Parkinson's disease community resources, including community fitness post d/c. Baseline:  Goal status: IN PROGRESS   PLAN:  PT FREQUENCY: 2x/week  PT DURATION: 6 weeks  PLANNED INTERVENTIONS: Therapeutic exercises, Therapeutic activity, Neuromuscular re-education, Balance training, Gait training, Patient/Family education, Self Care, Joint mobilization, Stair training, DME instructions, Manual therapy, and Re-evaluation  PLAN FOR NEXT SESSION: 10th visit PN; formal orthostatic hypotension assessment; progress postural and lumbopelvic/LE flexibility and strengthening - review/update HEP as needed; progress PWR! Moves   [place],  [place] 07/07/2022, 1:53 PM

## 2022-07-07 NOTE — Progress Notes (Signed)
Remote pacemaker transmission.   

## 2022-07-11 ENCOUNTER — Telehealth: Payer: Self-pay | Admitting: Neurology

## 2022-07-11 ENCOUNTER — Encounter: Payer: Medicare Other | Admitting: Physical Therapy

## 2022-07-11 NOTE — Telephone Encounter (Signed)
Pt is calling in needing a refill on his carbidopa-levodopa (SINEMET IR) 25-100 MG and would like for it to be sent to Pharm:  [facility] on [address] in [place], [place]

## 2022-07-12 ENCOUNTER — Ambulatory Visit: Payer: Medicare Other | Admitting: Physical Therapy

## 2022-07-12 ENCOUNTER — Encounter: Payer: Self-pay | Admitting: Physical Therapy

## 2022-07-12 ENCOUNTER — Other Ambulatory Visit: Payer: Self-pay

## 2022-07-12 DIAGNOSIS — G20A1 Parkinson's disease without dyskinesia, without mention of fluctuations: Secondary | ICD-10-CM

## 2022-07-12 DIAGNOSIS — R2681 Unsteadiness on feet: Secondary | ICD-10-CM

## 2022-07-12 DIAGNOSIS — R293 Abnormal posture: Secondary | ICD-10-CM

## 2022-07-12 DIAGNOSIS — R2689 Other abnormalities of gait and mobility: Secondary | ICD-10-CM | POA: Diagnosis not present

## 2022-07-12 DIAGNOSIS — M6281 Muscle weakness (generalized): Secondary | ICD-10-CM

## 2022-07-12 MED ORDER — CARBIDOPA-LEVODOPA 25-100 MG PO TABS: 2.0000 | ORAL_TABLET | Freq: Three times a day (TID) | ORAL | 0 refills | Status: DC

## 2022-07-12 NOTE — Therapy (Signed)
OUTPATIENT PHYSICAL THERAPY TREATMENT / RE-CERTIFICATION  Progress Note  Reporting Period 06/07/2022 to 07/12/2022   See note below for Objective Data and Assessment of Progress/Goals.    Patient Name: Victor Osborne MRN: [id number] DOB:18-Mar-1932, 89 y.o., male Today's Date: 07/12/2022   END OF SESSION:  PT End of Session - 07/12/22 1448     Visit Number 10    Date for PT Re-Evaluation 08/23/22    Authorization Type Medicare & [employer]    PT Start Time [phone]    PT Stop Time 1532    PT Time Calculation (min) 44 min    Activity Tolerance Patient tolerated treatment well    Behavior During Therapy WFL for tasks assessed/performed                Past Medical History:  Diagnosis Date   Bipolar 1 disorder (HCC)    COPD (chronic obstructive pulmonary disease) (HCC)    Depression    First degree AV block    Hypertension    pt denies but was in medical record   Hypoglycemia, unspecified    Myalgia and myositis, unspecified    Obesity    Pneumonia 1970   Skin cancer (melanoma) (HCC)    Thyroid disease    Unspecified hypothyroidism    UPJ obstruction, congenital    Past Surgical History:  Procedure Laterality Date   APPENDECTOMY  1939   malignant melanoma removed from right forehead  2008   PERMANENT PACEMAKER INSERTION N/A 06/19/2013   Procedure: PERMANENT PACEMAKER INSERTION;  Surgeon: Marinus Maw, MD;  Location: [facility] CATH LAB;  Service: Cardiovascular;  Laterality: N/A;   right big toe  surgery  2009   TRANSURETHRAL RESECTION OF PROSTATE  02/04/2011   Procedure: TRANSURETHRAL RESECTION OF THE PROSTATE (TURP);  Surgeon: Crecencio Mc, MD;  Location: [facility] ORS;  Service: Urology;  Laterality: N/A;   URETHRA SURGERY  2010   reconstruction   Patient Active Problem List   Diagnosis Date Noted   Hyperlipidemia 03/15/2022   TIA (transient ischemic attack) 03/14/2022   History of neoplasm 04/12/2021   Melanocytic nevi of trunk 04/12/2021   Other seborrheic  keratosis 04/12/2021   Cerebral embolism with cerebral infarction 03/31/2021   Left leg weakness 03/30/2021   Chronic post-traumatic stress disorder 12/05/2017   Bipolar I disorder, current or most recent episode depressed, in partial remission (HCC) 12/05/2017   CHB (complete heart block) (HCC) 01/27/2017   HTN (hypertension) 01/27/2017   Hypothyroidism 10/11/2016   Parkinson disease 09/08/2016   Chronic osteoarthritis 03/20/2015   Bipolar 1 disorder (HCC) 03/20/2015   Tremor 03/20/2015   Pacemaker 09/24/2013   SSS (sick sinus syndrome) (HCC) 06/19/2013   First degree AV block 04/09/2013   Allergic rhinitis 03/17/2013   Obstructive sleep apnea 12/18/2006   COPD mixed type (HCC) 12/18/2006   History of malignant melanoma of skin 12/18/2006   Diabetes mellitus (HCC) 12/18/2006    PCP: Olive Bass, FNP   REFERRING PROVIDER: Vladimir Faster, DO   REFERRING DIAG:  G45.9 (ICD-10-CM) - TIA (transient ischemic attack)  G20.A1 (ICD-10-CM) - Parkinson's disease without dyskinesia or fluctuating manifestations   THERAPY DIAG:  Other abnormalities of gait and mobility  Unsteadiness on feet  Muscle weakness (generalized)  Abnormal posture  RATIONALE FOR EVALUATION AND TREATMENT: Rehabilitation  ONSET DATE: 03/14/22 - TIA; Initial PD diagnosis >15 yrs ago  NEXT MD VISIT: 10/26/22   SUBJECTIVE:  SUBJECTIVE STATEMENT: Pt denies any recent dizziness but does note weakness such that it is a struggle to stand up at times. The compression socks seem to help.  PAIN: Are you having pain? No  PERTINENT HISTORY:  PPM 2 sick sinus syndrome/SA node dysfunction & complete heart block, orthostatic hypotension, COPD, OSA, HTN, DM2, Parkinson's Disease, CVA 03/2021, hypothyroidism, PTSD,  bipolar I disorder   PRECAUTIONS: Fall and ICD/Pacemaker  WEIGHT BEARING RESTRICTIONS: No  FALLS:  Has patient fallen in last 6 months? No  LIVING ENVIRONMENT: Lives with: lives alone and with his dog Lives in: Other Independent living facility (3rd floor) Stairs:  Elevator Has following equipment at home: Single point cane, Environmental consultant - 4 wheeled, and hiking pole  OCCUPATION: Retired  PLOF: Independent and Leisure: being outdoors - walking his dog, and stays active most of the day  PATIENT GOALS: "Improve my hand-eye coordination and especially balance."   OBJECTIVE: (objective measures completed at initial evaluation unless otherwise dated)  DIAGNOSTIC FINDINGS:  03/14/22 & 03/15/22 - CT Head w/o contrast:  IMPRESSION: 1. No evidence of acute intracranial abnormality. 2. Chronic small vessel ischemic disease and cerebral atrophy. 03/14/22 - CT Angio Head & Neck: IMPRESSION: 1. Negative CTA for large vessel occlusion or other emergent finding. 2. Mild for age atheromatous change about the carotid bifurcations and carotid siphons without hemodynamically significant stenosis.  COGNITION: Overall cognitive status: Within functional limits for tasks assessed   SENSATION: WFL Except neuropathy in his feet - most notable at night  COORDINATION: Gross motor WFL B UE/LE  MUSCLE LENGTH: Hamstrings: mod tight B ITB: mod tight L, mild tight R Piriformis: mild tight B Hip flexors: mod tight B Quads: mod tight B Heelcord: mod tight L, mild tight R  POSTURE:  rounded shoulders, forward head, and flexed trunk   LOWER EXTREMITY ROM:    Grossly WFL  LOWER EXTREMITY MMT:    MMT Right Eval Left Eval  Hip flexion 4 4  Hip extension 4 4  Hip abduction 4+ 4+  Hip adduction 4+ 4+  Hip internal rotation 4+ 4+  Hip external rotation 4 4  Knee flexion 5 5  Knee extension 5 5  Ankle dorsiflexion 4+ 4+  Ankle plantarflexion    Ankle inversion    Ankle eversion    (Blank rows  = not tested)  BED MOBILITY:  Sit to supine Complete Independence Supine to sit Complete Independence Rolling to Right Complete Independence  TRANSFERS: Assistive device utilized: None  Sit to stand: Complete Independence Stand to sit: Complete Independence Chair to chair: Modified independence Floor:  NT  GAIT: Distance walked: 100 ft Assistive device utilized: Environmental consultant - 4 wheeled and None Level of assistance: Modified independence and SBA Gait pattern: step through pattern, decreased arm swing- Right, decreased arm swing- Left, and trunk flexed Comments: more pronounced fwd head, flexed trunk posture noted than on previous PT episodes  RAMP: Level of Assistance:  NT Assistive device utilized:  NT Ramp Comments:   CURB:  Level of Assistance:  NT Assistive device utilized:  NT Curb Comments:   STAIRS:  Level of Assistance: SBA  Stair Negotiation Technique: Alternating Pattern  with Single Rail on Right  Number of Stairs: 14   Height of Stairs: 7"  Comments: Pt ascends stairs with weight mostly through ball of foot on step with heel hanging over edge of step causing slight drop and posterior shift at times, but no LOB due to grip on railing  FUNCTIONAL TESTS:  5  times sit to stand: 15.96 sec Timed up and go (TUG): Normal = 12.56 sec with rollator, 9.09 sec w/o AD; Manual = 9.32 sec; Cognitive = 14.34 sec 10 meter walk test: 7.97 sec with rollator, 7.59 sec w/o AD; Gait speed = 4.12 ft/sec with rollator, 4.32 ft/sec w/o AD,  Berg Balance Scale: 43/56; 37-45 = significant risk for falls (>80%)  (06/09/22) Functional gait assessment: 17/30; < 19 = high risk fall  06/27/22: 5xSTS = 16.25 sec  06/29/22: 5xSTS = 15 sec  07/12/22:  5xSTS = 13.65 sec with slight UE assist, 12.87 sec w/o UE assist Berg = 51/56; 46-51 moderate risk for falls (>50%)   Scores as of discharge from last PT episode - December 2023: 5 times sit to stand: 12.13 sec Timed up and go (TUG): Normal =  10.45 sec; Manual = 11.45 sec; Cognitive = 15.15 sec 10 meter walk test: 7.5 sec (no AD); Gait speed = 4.37 ft/sec Berg: 53/56; 52-55 lower (> 25%) fall risk Functional gait assessment: 27/30; 25-28 = low risk fall   PATIENT SURVEYS:  ABC scale 1240 / 1600 = 77.5 %   TODAY'S TREATMENT:              07/12/22 THERAPEUTIC ACTIVITIES: Orthostatic BP assessment:  Time BP HR Symptoms  Lying down 5 min 126/64 60* none  Sitting 1-2 min 116/58 60* none  Standing 1-2 min 94/50 60* Lightheaded, "out of focus", "off balance"        *pacemaker 5xSTS = 13.65 sec with slight UE assist, 12.87 sec w/o UE assist Berg = 51/56; 46-51 moderate risk for falls (>50%)  Goal assessment  SELF CARE: Reviewed strategies to help mitigate symptoms of orthostatic hypotension including: Slow transitions with pauses to ensure any dizziness clears Adequate hydration with limited caffeinated beverages Wearing compression stockings when up and moving    07/07/22 THERAPEUTIC EXERCISE: to improve flexibility, strength and mobility.  Verbal and tactile cues throughout for technique. Rec Bike - L3 x 6 min Seated heel-toe raises x 20 Seated alt LAQ x 20 Seated march x 20  NEUROMUSCULAR RE-EDUCATION: To improve posture, proprioception, coordination, amplitude of movement, speed of movement to reduce bradykinesia, and reduce rigidity. Prone PWR! Moves - Up, [employer], [employer] & Step x 10 - modified with hands and torso elevated using hands on seat of chair  THERAPEUTIC ACTIVITIES:  BP checks (after quadruped activities): Sitting 118/64 Standing 76/40 Sitting 106/58 Standing 88/44  SELF CARE: Review of education on strategies to help mitigate symptoms of orthostatic hypotension with handouts provided.   07/05/22 THERAPEUTIC EXERCISE: to improve flexibility, strength and mobility.  Verbal and tactile cues throughout for technique. NuStep - L5 x 6 min (UE/LE to promote improved reciprocal movement  patterns)  NEUROMUSCULAR RE-EDUCATION: To improve posture, balance, coordination, amplitude of movement, speed of movement to reduce bradykinesia, and reduce rigidity. Prone PWR! Moves - Up, Rock, Twist & Step x 10 - modified with pillow under abdomen for all but PWR! Twist Supine PWR! Moves - Up, [employer], Twist & Step x 10 PWR! Step back x 5 - discontinued d/t c/o dizziness  THERAPEUTIC ACTIVITIES:  BP checks (after c/o dizziness): Sitting 108/60 Standing 92/48, 88/44  SELF CARE: Reviewed strategies to help mitigate symptoms of orthostatic hypotension including: Slow transitions with pauses to ensure any dizziness clears Adequate hydration with limited caffeinated beverages Wearing compression stockings when up and moving    PATIENT EDUCATION:  Education details: transfer safety and orthostatic/postural hypotension   Person educated: Patient  Education method: Explanation, Verbal cues, and Handouts Education comprehension: verbalized understanding and needs further education  HOME EXERCISE PROGRAM: Access Code: [email or web address] URL: [email or web address] Date: 07/05/2022 Prepared by: Glenetta Hew  Exercises - Seated Hamstring Stretch  - 2 x daily - 7 x weekly - 3 reps - 30 sec hold - Seated Figure 4 Piriformis Stretch  - 2 x daily - 7 x weekly - 3 reps - 30 sec hold - Standing Hip Flexor Stretch  - 2 x daily - 7 x weekly - 3 reps - 30 sec hold - Standing ITB Stretch  - 2 x daily - 7 x weekly - 3 reps - 30 sec hold - Marching with Resistance  - 1 x daily - 7 x weekly - 2 sets - 10 reps - 3 sec hold - Standing Hip Extension with Resistance at Ankles and Counter Support  - 1 x daily - 7 x weekly - 2 sets - 10 reps - 3 sec hold  Patient Education - Postural Hypotension - Orthostatic Hypotension  PWR! Moves - Seated - Supine - Standing  Access Code: [email or web address] from previous episode   ASSESSMENT:  CLINICAL IMPRESSION: Victor Osborne" notes improvement in rolling over  in bed, but states getting up is still sometimes more difficult, likely in part due to orthostatic/postural hypotension which we have observed symptoms of in recent visits.  Formal orthostatic/postural hypotension assessment completed today with patient demonstrating symptomatic BP drop greater than 20 points upon standing, indicative of a positive test for orthostatic/postural hypotension.  We reviewed the previously discussed strategies to reduce and manage orthostatic hypotension, emphasizing wearing compression socks, increasing hydration/fluid intake, ensuring adequate pause between transitions, and remaining stationary until symptoms resolved before initiating walking to reduce the risk for falls.  Functionally he has improved in his ability to complete sit to stand transfers without need for UE assist in a time below the fall risk threshold, meeting his last remaining STG.  Improvement also noted on Berg with score increased to 51/56 indicating a decrease in fall risk to a moderate risk for falls.  Victor Osborne is progressing well with physical therapy but will likely benefit from extension of his POC to continue to address functional mobility and gait limitations as well as balance deficits to improve overall safety and reduce risk for falls, hence will recommend recert for continued PT 1-2x/wk for up to 4 to 6 weeks.  OBJECTIVE IMPAIRMENTS: Abnormal gait, decreased activity tolerance, decreased balance, decreased coordination, decreased endurance, decreased mobility, difficulty walking, decreased strength, decreased safety awareness, impaired perceived functional ability, impaired flexibility, improper body mechanics, and postural dysfunction.   ACTIVITY LIMITATIONS: carrying, lifting, standing, squatting, stairs, transfers, bed mobility, reach over head, locomotion level, and caring for others  PARTICIPATION LIMITATIONS: meal prep, cleaning, laundry, driving, shopping, and community activity  PERSONAL  FACTORS: Age, Fitness, Past/current experiences, Time since onset of injury/illness/exacerbation, and 3+ comorbidities: PPM 2 sick sinus syndrome/SA node dysfunction & complete heart block, orthostatic hypotension, COPD, OSA, HTN, DM2, Parkinson's Disease, CVA 03/2021, hypothyroidism, PTSD, bipolar I disorder   are also affecting patient's functional outcome.   REHAB POTENTIAL: Good  CLINICAL DECISION MAKING: Evolving/moderate complexity  EVALUATION COMPLEXITY: Moderate   GOALS: Goals reviewed with patient? Yes  SHORT TERM GOALS: Target date: 06/28/2022  Patient will be independent with initial HEP. Baseline:  Goal status: MET  06/16/22   2.  Patient will improve 5x STS time to </= 14 seconds to demonstrate improved functional strength and transfer efficiency.. Baseline: 15.96 sec Goal  status: MET  07/12/22 - 13.65 seconds with UE assist, 12.87 sec w/o UE assist  3.  Patient will be educated on strategies to decrease risk of falls.  Baseline:  Goal status: MET  06/27/22  LONG TERM GOALS: Target date: 07/19/2022, extended to 08/23/2022  Patient will be independent with ongoing/advanced HEP for self-management at home incorporating PWR! Moves as indicated .  Baseline:  Goal status: IN PROGRESS  07/12/22 - met for current HEP  2.  Patient will be able to ambulate at least 800 ft with LRAD including outdoor and unlevel surfaces, independently with no LOB, for improved community gait. Baseline:  Goal status: IN PROGRESS  3.  Patient will be able to step up/down curb safely with LRAD for safety with community ambulation.  Baseline:  Goal status: IN PROGRESS   4.  Patient will demonstrate at least 25/30 on FGA to improve gait stability and reduce risk for falls. (MCID = 4 points) Baseline: 17/30 Goal status: IN PROGRESS  5.  Patient will improve Berg score to >/= 52/56 to improve safety and stability with ADLs in standing and reduce risk for falls. (MCID = 8 points)   Baseline: TBD Goal  status: IN PROGRESS  07/12/22 - 51/56  6.  Patient will report >/= 85% on ABC scale to demonstrate improved balance confidence and decreased risk for falls. Baseline: 1240 / 1600 = 77.5 % Goal status: IN PROGRESS  7. Patient will verbalize understanding of local Parkinson's disease community resources, including community fitness post d/c. Baseline:  Goal status: IN PROGRESS   PLAN:  PT FREQUENCY: 2x/week  PT DURATION: 6 weeks  PLANNED INTERVENTIONS: Therapeutic exercises, Therapeutic activity, Neuromuscular re-education, Balance training, Gait training, Patient/Family education, Self Care, Joint mobilization, Stair training, DME instructions, Manual therapy, and Re-evaluation  PLAN FOR NEXT SESSION: Continue to address LTG's; progress postural and lumbopelvic/LE flexibility and strengthening - review/update HEP as needed; progress PWR! Moves   [place], [place] 07/12/2022, 3:27 PM

## 2022-07-12 NOTE — Telephone Encounter (Signed)
Sent refill called patient

## 2022-07-14 ENCOUNTER — Encounter: Payer: Self-pay | Admitting: Physical Therapy

## 2022-07-14 ENCOUNTER — Ambulatory Visit: Payer: Medicare Other | Admitting: Physical Therapy

## 2022-07-14 DIAGNOSIS — R2689 Other abnormalities of gait and mobility: Secondary | ICD-10-CM | POA: Diagnosis not present

## 2022-07-14 DIAGNOSIS — R293 Abnormal posture: Secondary | ICD-10-CM

## 2022-07-14 DIAGNOSIS — M6281 Muscle weakness (generalized): Secondary | ICD-10-CM

## 2022-07-14 DIAGNOSIS — R2681 Unsteadiness on feet: Secondary | ICD-10-CM

## 2022-07-14 NOTE — Therapy (Signed)
OUTPATIENT PHYSICAL THERAPY TREATMENT    Patient Name: Victor Osborne MRN: [id number] DOB:Apr 10, 1932, 89 y.o., male Today's Date: 07/14/2022   END OF SESSION:  PT End of Session - 07/14/22 1442     Visit Number 11    Date for PT Re-Evaluation 08/23/22    Authorization Type Medicare & [employer]    PT Start Time 1442    PT Stop Time 1532    PT Time Calculation (min) 50 min    Activity Tolerance Patient tolerated treatment well    Behavior During Therapy WFL for tasks assessed/performed                Past Medical History:  Diagnosis Date   Bipolar 1 disorder (HCC)    COPD (chronic obstructive pulmonary disease) (HCC)    Depression    First degree AV block    Hypertension    pt denies but was in medical record   Hypoglycemia, unspecified    Myalgia and myositis, unspecified    Obesity    Pneumonia 1970   Skin cancer (melanoma) (HCC)    Thyroid disease    Unspecified hypothyroidism    UPJ obstruction, congenital    Past Surgical History:  Procedure Laterality Date   APPENDECTOMY  1939   malignant melanoma removed from right forehead  2008   PERMANENT PACEMAKER INSERTION N/A 06/19/2013   Procedure: PERMANENT PACEMAKER INSERTION;  Surgeon: Marinus Maw, MD;  Location: [facility] CATH LAB;  Service: Cardiovascular;  Laterality: N/A;   right big toe  surgery  2009   TRANSURETHRAL RESECTION OF PROSTATE  02/04/2011   Procedure: TRANSURETHRAL RESECTION OF THE PROSTATE (TURP);  Surgeon: Crecencio Mc, MD;  Location: [facility] ORS;  Service: Urology;  Laterality: N/A;   URETHRA SURGERY  2010   reconstruction   Patient Active Problem List   Diagnosis Date Noted   Hyperlipidemia 03/15/2022   TIA (transient ischemic attack) 03/14/2022   History of neoplasm 04/12/2021   Melanocytic nevi of trunk 04/12/2021   Other seborrheic keratosis 04/12/2021   Cerebral embolism with cerebral infarction 03/31/2021   Left leg weakness 03/30/2021   Chronic post-traumatic stress disorder  12/05/2017   Bipolar I disorder, current or most recent episode depressed, in partial remission (HCC) 12/05/2017   CHB (complete heart block) (HCC) 01/27/2017   HTN (hypertension) 01/27/2017   Hypothyroidism 10/11/2016   Parkinson disease 09/08/2016   Chronic osteoarthritis 03/20/2015   Bipolar 1 disorder (HCC) 03/20/2015   Tremor 03/20/2015   Pacemaker 09/24/2013   SSS (sick sinus syndrome) (HCC) 06/19/2013   First degree AV block 04/09/2013   Allergic rhinitis 03/17/2013   Obstructive sleep apnea 12/18/2006   COPD mixed type (HCC) 12/18/2006   History of malignant melanoma of skin 12/18/2006   Diabetes mellitus (HCC) 12/18/2006    PCP: Olive Bass, FNP   REFERRING PROVIDER: Vladimir Faster, DO   REFERRING DIAG:  G45.9 (ICD-10-CM) - TIA (transient ischemic attack)  G20.A1 (ICD-10-CM) - Parkinson's disease without dyskinesia or fluctuating manifestations   THERAPY DIAG:  Other abnormalities of gait and mobility  Unsteadiness on feet  Muscle weakness (generalized)  Abnormal posture  RATIONALE FOR EVALUATION AND TREATMENT: Rehabilitation  ONSET DATE: 03/14/22 - TIA; Initial PD diagnosis >15 yrs ago  NEXT MD VISIT: 10/26/22   SUBJECTIVE:  SUBJECTIVE STATEMENT: Pt reports he feels a lot better because he has been so active mentally and physically.  PAIN: Are you having pain? No  PERTINENT HISTORY:  PPM 2 sick sinus syndrome/SA node dysfunction & complete heart block, orthostatic hypotension, COPD, OSA, HTN, DM2, Parkinson's Disease, CVA 03/2021, hypothyroidism, PTSD, bipolar I disorder   PRECAUTIONS: Fall and ICD/Pacemaker  WEIGHT BEARING RESTRICTIONS: No  FALLS:  Has patient fallen in last 6 months? No  LIVING ENVIRONMENT: Lives with: lives alone and with  his dog Lives in: Other Independent living facility (3rd floor) Stairs:  Elevator Has following equipment at home: Single point cane, Environmental consultant - 4 wheeled, and hiking pole  OCCUPATION: Retired  PLOF: Independent and Leisure: being outdoors - walking his dog, and stays active most of the day  PATIENT GOALS: "Improve my hand-eye coordination and especially balance."   OBJECTIVE: (objective measures completed at initial evaluation unless otherwise dated)  DIAGNOSTIC FINDINGS:  03/14/22 & 03/15/22 - CT Head w/o contrast:  IMPRESSION: 1. No evidence of acute intracranial abnormality. 2. Chronic small vessel ischemic disease and cerebral atrophy. 03/14/22 - CT Angio Head & Neck: IMPRESSION: 1. Negative CTA for large vessel occlusion or other emergent finding. 2. Mild for age atheromatous change about the carotid bifurcations and carotid siphons without hemodynamically significant stenosis.  COGNITION: Overall cognitive status: Within functional limits for tasks assessed   SENSATION: WFL Except neuropathy in his feet - most notable at night  COORDINATION: Gross motor WFL B UE/LE  MUSCLE LENGTH: Hamstrings: mod tight B ITB: mod tight L, mild tight R Piriformis: mild tight B Hip flexors: mod tight B Quads: mod tight B Heelcord: mod tight L, mild tight R  POSTURE:  rounded shoulders, forward head, and flexed trunk   LOWER EXTREMITY ROM:    Grossly WFL  LOWER EXTREMITY MMT:    MMT Right Eval Left Eval R 07/14/22 L 07/14/22  Hip flexion 4 4 4+ 4+  Hip extension 4 4 4 4   Hip abduction 4+ 4+ 4+ 4+  Hip adduction 4+ 4+ 4+ 5  Hip internal rotation 4+ 4+ 5 5  Hip external rotation 4 4 4 4   Knee flexion 5 5 5 5   Knee extension 5 5 5 5   Ankle dorsiflexion 4+ 4+ 5 4+  Ankle plantarflexion      Ankle inversion      Ankle eversion      (Blank rows = not tested)  BED MOBILITY:  Sit to supine Complete Independence Supine to sit Complete Independence Rolling to Right Complete  Independence  TRANSFERS: Assistive device utilized: None  Sit to stand: Complete Independence Stand to sit: Complete Independence Chair to chair: Modified independence Floor:  NT  GAIT: Distance walked: 100 ft Assistive device utilized: Environmental consultant - 4 wheeled and None Level of assistance: Modified independence and SBA Gait pattern: step through pattern, decreased arm swing- Right, decreased arm swing- Left, and trunk flexed Comments: more pronounced fwd head, flexed trunk posture noted than on previous PT episodes  RAMP: Level of Assistance:  NT Assistive device utilized:  NT Ramp Comments:   CURB:  Level of Assistance:  NT Assistive device utilized:  NT Curb Comments:   STAIRS:  Level of Assistance: SBA  Stair Negotiation Technique: Alternating Pattern  with Single Rail on Right  Number of Stairs: 14   Height of Stairs: 7"  Comments: Pt ascends stairs with weight mostly through ball of foot on step with heel hanging over edge of step causing slight drop  and posterior shift at times, but no LOB due to grip on railing  FUNCTIONAL TESTS:  5 times sit to stand: 15.96 sec Timed up and go (TUG): Normal = 12.56 sec with rollator, 9.09 sec w/o AD; Manual = 9.32 sec; Cognitive = 14.34 sec 10 meter walk test: 7.97 sec with rollator, 7.59 sec w/o AD; Gait speed = 4.12 ft/sec with rollator, 4.32 ft/sec w/o AD,  Berg Balance Scale: 43/56; 37-45 = significant risk for falls (>80%)  (06/09/22) Functional gait assessment: 17/30; < 19 = high risk fall  06/27/22: 5xSTS = 16.25 sec  06/29/22: 5xSTS = 15 sec  07/12/22:  5xSTS = 13.65 sec with slight UE assist, 12.87 sec w/o UE assist Berg = 51/56; 46-51 moderate risk for falls (>50%)   07/14/22: : 7.03 sec w/o AD Gait speed: 4.67 ft/sec w/o AD FGA: 23/30   Scores as of discharge from last PT episode - December 2023: 5 times sit to stand: 12.13 sec Timed up and go (TUG): Normal = 10.45 sec; Manual = 11.45 sec; Cognitive = 15.15  sec 10 meter walk test: 7.5 sec (no AD); Gait speed = 4.37 ft/sec Berg: 53/56; 52-55 lower (> 25%) fall risk Functional gait assessment: 27/30; 25-28 = low risk fall   PATIENT SURVEYS:  ABC scale 1240 / 1600 = 77.5 %   TODAY'S TREATMENT:              07/14/22 THERAPEUTIC EXERCISE: to improve flexibility, strength and mobility.  Verbal and tactile cues throughout for technique. NuStep - L5 x 6 min (UE/LE to promote improved reciprocal movement patterns) Eccentric squat 2 x 5 (hovering over mat table)  THERAPEUTIC ACTIVITIES: : 7.03 sec w/o AD Gait speed: 4.67 ft/sec w/o AD FGA: 23/30 LE MMT  GAIT TRAINING: To normalize gait pattern and improve safety with SPC . Distance walked: 800 ft Assistive device utilized: Single point cane Level of assistance: Modified independence Gait pattern: trunk flexed, poor foot clearance- Right, and poor foot clearance- Left Comments: Gait assessed on indoor and outdoor surfaces including grass, sidewalks, pavement, inclines and curbs.  Cues at time for increased foot clearance but no LOB observed. Stairs: Level of Assistance: Modified independence and SBA Stair Negotiation Technique: Alternating Pattern  With use of AD: SPC  with Single Rail on Left Number of Stairs: 14 x 2  Height of Stairs: 7"  Comments: Patient able to navigate stairs reciprocally however requires single Rail support for balance.  LOB occurred on last step on descent due to patient misjudging step but able to self-correct and prevent fall.  On stair ascent he continues to require cueing to ensure majority of foot supported on step to reduce posterior LOB from weight shift over back/head of step.   07/12/22 THERAPEUTIC ACTIVITIES: Orthostatic BP assessment:  Time BP HR Symptoms  Lying down 5 min 126/64 60* none  Sitting 1-2 min 116/58 60* none  Standing 1-2 min 94/50 60* Lightheaded, "out of focus", "off balance"        *pacemaker 5xSTS = 13.65 sec with slight UE  assist, 12.87 sec w/o UE assist Berg = 51/56; 46-51 moderate risk for falls (>50%)  Goal assessment  SELF CARE: Reviewed strategies to help mitigate symptoms of orthostatic hypotension including: Slow transitions with pauses to ensure any dizziness clears Adequate hydration with limited caffeinated beverages Wearing compression stockings when up and moving    07/07/22 THERAPEUTIC EXERCISE: to improve flexibility, strength and mobility.  Verbal and tactile cues throughout for technique. Rec Bike -  L3 x 6 min Seated heel-toe raises x 20 Seated alt LAQ x 20 Seated march x 20  NEUROMUSCULAR RE-EDUCATION: To improve posture, proprioception, coordination, amplitude of movement, speed of movement to reduce bradykinesia, and reduce rigidity. Prone PWR! Moves - Up, [employer], [employer] & Step x 10 - modified with hands and torso elevated using hands on seat of chair  THERAPEUTIC ACTIVITIES:  BP checks (after quadruped activities): Sitting 118/64 Standing 76/40 Sitting 106/58 Standing 88/44  SELF CARE: Review of education on strategies to help mitigate symptoms of orthostatic hypotension with handouts provided.   PATIENT EDUCATION:  Education details: progress with PT, ongoing PT POC, and gait safety with stairs   Person educated: Patient Education method: Explanation, Demonstration, and Verbal cues Education comprehension: verbalized understanding and needs further education  HOME EXERCISE PROGRAM: Access Code: [email or web address] URL: [email or web address] Date: 07/05/2022 Prepared by: Glenetta Hew  Exercises - Seated Hamstring Stretch  - 2 x daily - 7 x weekly - 3 reps - 30 sec hold - Seated Figure 4 Piriformis Stretch  - 2 x daily - 7 x weekly - 3 reps - 30 sec hold - Standing Hip Flexor Stretch  - 2 x daily - 7 x weekly - 3 reps - 30 sec hold - Standing ITB Stretch  - 2 x daily - 7 x weekly - 3 reps - 30 sec hold - Marching with Resistance  - 1 x daily - 7 x weekly - 2 sets - 10  reps - 3 sec hold - Standing Hip Extension with Resistance at Ankles and Counter Support  - 1 x daily - 7 x weekly - 2 sets - 10 reps - 3 sec hold  Patient Education - Postural Hypotension - Orthostatic Hypotension  PWR! Moves - Seated - Supine - Standing  Access Code: [email or web address] from previous episode   ASSESSMENT:  CLINICAL IMPRESSION: Victor Osborne" reports he has been feeling better now that he is becoming more mentally and physically active.  He is now more consistently using the [facility] than his rollator for gait and was safely able to ambulate over various indoor/outdoor surfaces with only losses of balance occurring during stair negotiation as above.  FGA has improved from 17/30 to 23/30 indicating a reduction in fall risk to a medium fall risk.  He continues to note feeling of proximal LE weakness with MMT still indicating hip weakness and majority of motions.  Victor Osborne is progressing well towards his PT goals and will continue to benefit from skilled PT to address ongoing postural, strength and balance deficits to improve mobility and activity tolerance with decreased risk for falls.   OBJECTIVE IMPAIRMENTS: Abnormal gait, decreased activity tolerance, decreased balance, decreased coordination, decreased endurance, decreased mobility, difficulty walking, decreased strength, decreased safety awareness, impaired perceived functional ability, impaired flexibility, improper body mechanics, and postural dysfunction.   ACTIVITY LIMITATIONS: carrying, lifting, standing, squatting, stairs, transfers, bed mobility, reach over head, locomotion level, and caring for others  PARTICIPATION LIMITATIONS: meal prep, cleaning, laundry, driving, shopping, and community activity  PERSONAL FACTORS: Age, Fitness, Past/current experiences, Time since onset of injury/illness/exacerbation, and 3+ comorbidities: PPM 2 sick sinus syndrome/SA node dysfunction & complete heart block, orthostatic hypotension, COPD,  OSA, HTN, DM2, Parkinson's Disease, CVA 03/2021, hypothyroidism, PTSD, bipolar I disorder   are also affecting patient's functional outcome.   REHAB POTENTIAL: Good  CLINICAL DECISION MAKING: Evolving/moderate complexity  EVALUATION COMPLEXITY: Moderate   GOALS: Goals reviewed with patient? Yes  SHORT TERM GOALS: Target date: 06/28/2022  Patient will  be independent with initial HEP. Baseline:  Goal status: MET  06/16/22   2.  Patient will improve 5x STS time to </= 14 seconds to demonstrate improved functional strength and transfer efficiency.. Baseline: 15.96 sec Goal status: MET  07/12/22 - 13.65 seconds with UE assist, 12.87 sec w/o UE assist  3.  Patient will be educated on strategies to decrease risk of falls.  Baseline:  Goal status: MET  06/27/22  LONG TERM GOALS: Target date: 07/19/2022, extended to 08/23/2022  Patient will be independent with ongoing/advanced HEP for self-management at home incorporating PWR! Moves as indicated .  Baseline:  Goal status: IN PROGRESS  07/12/22 - met for current HEP  2.  Patient will be able to ambulate at least 800 ft with LRAD including outdoor and unlevel surfaces, independently with no LOB, for improved community gait. Baseline:  Goal status: MET  07/14/22  3.  Patient will be able to step up/down curb safely with LRAD for safety with community ambulation.  Baseline:  Goal status: MET  07/14/22   4.  Patient will demonstrate at least 25/30 on FGA to improve gait stability and reduce risk for falls. (MCID = 4 points) Baseline: 17/30 Goal status: IN PROGRESS  07/14/22 - 23/30  5.  Patient will improve Berg score to >/= 52/56 to improve safety and stability with ADLs in standing and reduce risk for falls. (MCID = 8 points)   Baseline: TBD Goal status: IN PROGRESS  07/12/22 - 51/56  6.  Patient will report >/= 85% on ABC scale to demonstrate improved balance confidence and decreased risk for falls. Baseline: 1240 / 1600 = 77.5 % Goal  status: IN PROGRESS  7. Patient will verbalize understanding of local Parkinson's disease community resources, including community fitness post d/c. Baseline:  Goal status: IN PROGRESS   PLAN:  PT FREQUENCY: 2x/week  PT DURATION: 6 weeks  PLANNED INTERVENTIONS: Therapeutic exercises, Therapeutic activity, Neuromuscular re-education, Balance training, Gait training, Patient/Family education, Self Care, Joint mobilization, Stair training, DME instructions, Manual therapy, and Re-evaluation  PLAN FOR NEXT SESSION: progress postural and lumbopelvic/LE flexibility and strengthening - review/update HEP as needed; progress PWR! Moves   Willey, Brandon 07/14/2022, 4:46 PM

## 2022-07-15 ENCOUNTER — Encounter: Payer: Medicare Other | Admitting: Physical Therapy

## 2022-07-18 ENCOUNTER — Encounter: Payer: Medicare Other | Admitting: Physical Therapy

## 2022-07-19 ENCOUNTER — Ambulatory Visit: Payer: Medicare Other | Attending: Neurology | Admitting: Physical Therapy

## 2022-07-19 ENCOUNTER — Encounter: Payer: Self-pay | Admitting: Physical Therapy

## 2022-07-19 DIAGNOSIS — R2681 Unsteadiness on feet: Secondary | ICD-10-CM | POA: Insufficient documentation

## 2022-07-19 DIAGNOSIS — M6281 Muscle weakness (generalized): Secondary | ICD-10-CM | POA: Insufficient documentation

## 2022-07-19 DIAGNOSIS — R2689 Other abnormalities of gait and mobility: Secondary | ICD-10-CM | POA: Diagnosis present

## 2022-07-19 DIAGNOSIS — R293 Abnormal posture: Secondary | ICD-10-CM | POA: Diagnosis present

## 2022-07-19 NOTE — Therapy (Signed)
OUTPATIENT PHYSICAL THERAPY TREATMENT    Patient Name: Victor Osborne MRN: [id number] DOB:05-26-1932, 89 y.o., male Today's Date: 07/19/2022   END OF SESSION:  PT End of Session - 07/19/22 1312     Visit Number 12    Date for PT Re-Evaluation 08/23/22    Authorization Type Medicare & [employer]    PT Start Time 1312    PT Stop Time 1358    PT Time Calculation (min) 46 min    Activity Tolerance Patient tolerated treatment well    Behavior During Therapy WFL for tasks assessed/performed                Past Medical History:  Diagnosis Date   Bipolar 1 disorder (HCC)    COPD (chronic obstructive pulmonary disease) (HCC)    Depression    First degree AV block    Hypertension    pt denies but was in medical record   Hypoglycemia, unspecified    Myalgia and myositis, unspecified    Obesity    Pneumonia 1970   Skin cancer (melanoma) (HCC)    Thyroid disease    Unspecified hypothyroidism    UPJ obstruction, congenital    Past Surgical History:  Procedure Laterality Date   APPENDECTOMY  1939   malignant melanoma removed from right forehead  2008   PERMANENT PACEMAKER INSERTION N/A 06/19/2013   Procedure: PERMANENT PACEMAKER INSERTION;  Surgeon: Marinus Maw, MD;  Location: [facility];  Service: Cardiovascular;  Laterality: N/A;   right big toe  surgery  2009   TRANSURETHRAL RESECTION OF PROSTATE  02/04/2011   Procedure: TRANSURETHRAL RESECTION OF THE PROSTATE (TURP);  Surgeon: Crecencio Mc, MD;  Location: [facility];  Service: Urology;  Laterality: N/A;   URETHRA SURGERY  2010   reconstruction   Patient Active Problem List   Diagnosis Date Noted   Hyperlipidemia 03/15/2022   TIA (transient ischemic attack) 03/14/2022   History of neoplasm 04/12/2021   Melanocytic nevi of trunk 04/12/2021   Other seborrheic keratosis 04/12/2021   Cerebral embolism with cerebral infarction 03/31/2021   Left leg weakness 03/30/2021   Chronic post-traumatic stress disorder  12/05/2017   Bipolar I disorder, current or most recent episode depressed, in partial remission (HCC) 12/05/2017   CHB (complete heart block) (HCC) 01/27/2017   HTN (hypertension) 01/27/2017   Hypothyroidism 10/11/2016   Parkinson disease 09/08/2016   Chronic osteoarthritis 03/20/2015   Bipolar 1 disorder (HCC) 03/20/2015   Tremor 03/20/2015   Pacemaker 09/24/2013   SSS (sick sinus syndrome) (HCC) 06/19/2013   First degree AV block 04/09/2013   Allergic rhinitis 03/17/2013   Obstructive sleep apnea 12/18/2006   COPD mixed type (HCC) 12/18/2006   History of malignant melanoma of skin 12/18/2006   Diabetes mellitus (HCC) 12/18/2006    PCP: Olive Bass, FNP   REFERRING PROVIDER: Vladimir Faster, DO   REFERRING DIAG:  G45.9 (ICD-10-CM) - TIA (transient ischemic attack)  G20.A1 (ICD-10-CM) - Parkinson's disease without dyskinesia or fluctuating manifestations   THERAPY DIAG:  Other abnormalities of gait and mobility  Unsteadiness on feet  Muscle weakness (generalized)  Abnormal posture  RATIONALE FOR EVALUATION AND TREATMENT: Rehabilitation  ONSET DATE: 03/14/22 - TIA; Initial PD diagnosis >15 yrs ago  NEXT MD VISIT: 10/26/22   SUBJECTIVE:  SUBJECTIVE STATEMENT: Pt reports he feels a lot better because he has been so active mentally and physically.  PAIN: Are you having pain? No  PERTINENT HISTORY:  PPM 2 sick sinus syndrome/SA node dysfunction & complete heart block, orthostatic hypotension, COPD, OSA, HTN, DM2, Parkinson's Disease, CVA 03/2021, hypothyroidism, PTSD, bipolar I disorder   PRECAUTIONS: Fall and ICD/Pacemaker  WEIGHT BEARING RESTRICTIONS: No  FALLS:  Has patient fallen in last 6 months? No  LIVING ENVIRONMENT: Lives with: lives alone and with  his dog Lives in: Other Independent living facility (3rd floor) Stairs:  Elevator Has following equipment at home: Single point cane, Environmental consultant - 4 wheeled, and hiking pole  OCCUPATION: Retired  PLOF: Independent and Leisure: being outdoors - walking his dog, and stays active most of the day  PATIENT GOALS: "Improve my hand-eye coordination and especially balance."   OBJECTIVE: (objective measures completed at initial evaluation unless otherwise dated)  DIAGNOSTIC FINDINGS:  03/14/22 & 03/15/22 - CT Head w/o contrast:  IMPRESSION: 1. No evidence of acute intracranial abnormality. 2. Chronic small vessel ischemic disease and cerebral atrophy. 03/14/22 - CT Angio Head & Neck: IMPRESSION: 1. Negative CTA for large vessel occlusion or other emergent finding. 2. Mild for age atheromatous change about the carotid bifurcations and carotid siphons without hemodynamically significant stenosis.  COGNITION: Overall cognitive status: Within functional limits for tasks assessed   SENSATION: WFL Except neuropathy in his feet - most notable at night  COORDINATION: Gross motor WFL B UE/LE  MUSCLE LENGTH: Hamstrings: mod tight B ITB: mod tight L, mild tight R Piriformis: mild tight B Hip flexors: mod tight B Quads: mod tight B Heelcord: mod tight L, mild tight R  POSTURE:  rounded shoulders, forward head, and flexed trunk   LOWER EXTREMITY ROM:    Grossly WFL  LOWER EXTREMITY MMT:    MMT Right Eval Left Eval R 07/14/22 L 07/14/22  Hip flexion 4 4 4+ 4+  Hip extension 4 4 4 4   Hip abduction 4+ 4+ 4+ 4+  Hip adduction 4+ 4+ 4+ 5  Hip internal rotation 4+ 4+ 5 5  Hip external rotation 4 4 4 4   Knee flexion 5 5 5 5   Knee extension 5 5 5 5   Ankle dorsiflexion 4+ 4+ 5 4+  Ankle plantarflexion      Ankle inversion      Ankle eversion      (Blank rows = not tested)  BED MOBILITY:  Sit to supine Complete Independence Supine to sit Complete Independence Rolling to Right Complete  Independence  TRANSFERS: Assistive device utilized: None  Sit to stand: Complete Independence Stand to sit: Complete Independence Chair to chair: Modified independence Floor:  NT  GAIT: Distance walked: 100 ft Assistive device utilized: Environmental consultant - 4 wheeled and None Level of assistance: Modified independence and SBA Gait pattern: step through pattern, decreased arm swing- Right, decreased arm swing- Left, and trunk flexed Comments: more pronounced fwd head, flexed trunk posture noted than on previous PT episodes  RAMP: Level of Assistance:  NT Assistive device utilized:  NT Ramp Comments:   CURB:  Level of Assistance:  NT Assistive device utilized:  NT Curb Comments:   STAIRS:  Level of Assistance: SBA  Stair Negotiation Technique: Alternating Pattern  with Single Rail on Right  Number of Stairs: 14   Height of Stairs: 7"  Comments: Pt ascends stairs with weight mostly through ball of foot on step with heel hanging over edge of step causing slight drop  and posterior shift at times, but no LOB due to grip on railing  FUNCTIONAL TESTS:  5 times sit to stand: 15.96 sec Timed up and go (TUG): Normal = 12.56 sec with rollator, 9.09 sec w/o AD; Manual = 9.32 sec; Cognitive = 14.34 sec 10 meter walk test: 7.97 sec with rollator, 7.59 sec w/o AD; Gait speed = 4.12 ft/sec with rollator, 4.32 ft/sec w/o AD,  Berg Balance Scale: 43/56; 37-45 = significant risk for falls (>80%)  (06/09/22) Functional gait assessment: 17/30; < 19 = high risk fall  06/27/22: 5xSTS = 16.25 sec  06/29/22: 5xSTS = 15 sec  07/12/22:  5xSTS = 13.65 sec with slight UE assist, 12.87 sec w/o UE assist Berg = 51/56; 46-51 moderate risk for falls (>50%)   07/14/22: : 7.03 sec w/o AD Gait speed: 4.67 ft/sec w/o AD FGA: 23/30  Scores as of discharge from last PT episode - December 2023: 5 times sit to stand: 12.13 sec Timed up and go (TUG): Normal = 10.45 sec; Manual = 11.45 sec; Cognitive = 15.15  sec 10 meter walk test: 7.5 sec (no AD); Gait speed = 4.37 ft/sec Berg: 53/56; 52-55 lower (> 25%) fall risk Functional gait assessment: 27/30; 25-28 = low risk fall   PATIENT SURVEYS:  ABC scale 1240 / 1600 = 77.5 %   TODAY'S TREATMENT:              07/19/22 THERAPEUTIC EXERCISE: to improve flexibility, strength and mobility.  Verbal and tactile cues throughout for technique. NuStep - L5 x 6 min (UE/LE to promote improved reciprocal movement patterns) TRX squat with light glute touch to chair x 10, with hover just above chair 10 x 3" TRX lateral lunge x 10  NEUROMUSCULAR RE-EDUCATION: To improve posture, balance, coordination, reduce fall risk, amplitude of movement, speed of movement to reduce bradykinesia, and reduce rigidity. Quadruped/All 4's PWR! Moves - Up, Rock, Twist & Step x 10 - modified with hands and torso elevated using hands on seat of chair Prone PWR! Moves - Up, Rock, [place] & Step x 5-8 - modified with pillow under abdomen/pelvis   07/14/22 THERAPEUTIC EXERCISE: to improve flexibility, strength and mobility.  Verbal and tactile cues throughout for technique. NuStep - L5 x 6 min (UE/LE to promote improved reciprocal movement patterns) Eccentric squat 2 x 5 (hovering over mat table)  THERAPEUTIC ACTIVITIES: : 7.03 sec w/o AD Gait speed: 4.67 ft/sec w/o AD FGA: 23/30 LE MMT  GAIT TRAINING: To normalize gait pattern and improve safety with SPC . Distance walked: 800 ft Assistive device utilized: Single point cane Level of assistance: Modified independence Gait pattern: trunk flexed, poor foot clearance- Right, and poor foot clearance- Left Comments: Gait assessed on indoor and outdoor surfaces including grass, sidewalks, pavement, inclines and curbs.  Cues at time for increased foot clearance but no LOB observed. Stairs: Level of Assistance: Modified independence and SBA Stair Negotiation Technique: Alternating Pattern  With use of AD: SPC  with Single Rail on  Left Number of Stairs: 14 x 2  Height of Stairs: 7"  Comments: Patient able to navigate stairs reciprocally however requires single Rail support for balance.  LOB occurred on last step on descent due to patient misjudging step but able to self-correct and prevent fall.  On stair ascent he continues to require cueing to ensure majority of foot supported on step to reduce posterior LOB from weight shift over back/head of step.   07/12/22 THERAPEUTIC ACTIVITIES: Orthostatic BP assessment:  Time BP  HR Symptoms  Lying down 5 min 126/64 60* none  Sitting 1-2 min 116/58 60* none  Standing 1-2 min 94/50 60* Lightheaded, "out of focus", "off balance"        *pacemaker 5xSTS = 13.65 sec with slight UE assist, 12.87 sec w/o UE assist Berg = 51/56; 46-51 moderate risk for falls (>50%)  Goal assessment  SELF CARE: Reviewed strategies to help mitigate symptoms of orthostatic hypotension including: Slow transitions with pauses to ensure any dizziness clears Adequate hydration with limited caffeinated beverages Wearing compression stockings when up and moving    PATIENT EDUCATION:  Education details: progress with PT, ongoing PT POC, and gait safety with stairs   Person educated: Patient Education method: Explanation, Demonstration, and Verbal cues Education comprehension: verbalized understanding and needs further education  HOME EXERCISE PROGRAM: Access Code: [email or web address] URL: [email or web address] Date: 07/05/2022 Prepared by: Glenetta Hew  Exercises - Seated Hamstring Stretch  - 2 x daily - 7 x weekly - 3 reps - 30 sec hold - Seated Figure 4 Piriformis Stretch  - 2 x daily - 7 x weekly - 3 reps - 30 sec hold - Standing Hip Flexor Stretch  - 2 x daily - 7 x weekly - 3 reps - 30 sec hold - Standing ITB Stretch  - 2 x daily - 7 x weekly - 3 reps - 30 sec hold - Marching with Resistance  - 1 x daily - 7 x weekly - 2 sets - 10 reps - 3 sec hold - Standing Hip Extension with  Resistance at Ankles and Counter Support  - 1 x daily - 7 x weekly - 2 sets - 10 reps - 3 sec hold  Patient Education - Postural Hypotension - Orthostatic Hypotension  PWR! Moves - Seated - Supine - Standing - Quadruped/All 4's - Prone  Access Code: [email or web address] from previous episode   ASSESSMENT:  CLINICAL IMPRESSION: Continued emphasis on proximal strengthening with functional carryover for sit to stand transitions using TRX for support.  Fatigue noted limiting activity with seated rest breaks required between sets.  Reviewed quadruped and prone PWR! Moves with modifications to improve comfort, however patient still limited by fatigue particularly with upper body weightbearing, therefore instructed patient to reduce reps and perform these exercises on separate days when completing them at home.  OBJECTIVE IMPAIRMENTS: Abnormal gait, decreased activity tolerance, decreased balance, decreased coordination, decreased endurance, decreased mobility, difficulty walking, decreased strength, decreased safety awareness, impaired perceived functional ability, impaired flexibility, improper body mechanics, and postural dysfunction.   ACTIVITY LIMITATIONS: carrying, lifting, standing, squatting, stairs, transfers, bed mobility, reach over head, locomotion level, and caring for others  PARTICIPATION LIMITATIONS: meal prep, cleaning, laundry, driving, shopping, and community activity  PERSONAL FACTORS: Age, Fitness, Past/current experiences, Time since onset of injury/illness/exacerbation, and 3+ comorbidities: PPM 2 sick sinus syndrome/SA node dysfunction & complete heart block, orthostatic hypotension, COPD, OSA, HTN, DM2, Parkinson's Disease, CVA 03/2021, hypothyroidism, PTSD, bipolar I disorder   are also affecting patient's functional outcome.   REHAB POTENTIAL: Good  CLINICAL DECISION MAKING: Evolving/moderate complexity  EVALUATION COMPLEXITY: Moderate   GOALS: Goals reviewed with  patient? Yes  SHORT TERM GOALS: Target date: 06/28/2022  Patient will be independent with initial HEP. Baseline:  Goal status: MET  06/16/22   2.  Patient will improve 5x STS time to </= 14 seconds to demonstrate improved functional strength and transfer efficiency.. Baseline: 15.96 sec Goal status: MET  07/12/22 - 13.65 seconds with UE assist, 12.87 sec w/o UE assist  3.  Patient will be educated on strategies to decrease risk of falls.  Baseline:  Goal status: MET  06/27/22  LONG TERM GOALS: Target date: 07/19/2022, extended to 08/23/2022  Patient will be independent with ongoing/advanced HEP for self-management at home incorporating PWR! Moves as indicated .  Baseline:  Goal status: IN PROGRESS  07/12/22 - met for current HEP  2.  Patient will be able to ambulate at least 800 ft with LRAD including outdoor and unlevel surfaces, independently with no LOB, for improved community gait. Baseline:  Goal status: MET  07/14/22  3.  Patient will be able to step up/down curb safely with LRAD for safety with community ambulation.  Baseline:  Goal status: MET  07/14/22   4.  Patient will demonstrate at least 25/30 on FGA to improve gait stability and reduce risk for falls. (MCID = 4 points) Baseline: 17/30 Goal status: IN PROGRESS  07/14/22 - 23/30  5.  Patient will improve Berg score to >/= 52/56 to improve safety and stability with ADLs in standing and reduce risk for falls. (MCID = 8 points)   Baseline: TBD Goal status: IN PROGRESS  07/12/22 - 51/56  6.  Patient will report >/= 85% on ABC scale to demonstrate improved balance confidence and decreased risk for falls. Baseline: 1240 / 1600 = 77.5 % Goal status: IN PROGRESS  7. Patient will verbalize understanding of local Parkinson's disease community resources, including community fitness post d/c. Baseline:  Goal status: IN PROGRESS   PLAN:  PT FREQUENCY: 2x/week  PT DURATION: 6 weeks  PLANNED INTERVENTIONS: Therapeutic exercises,  Therapeutic activity, Neuromuscular re-education, Balance training, Gait training, Patient/Family education, Self Care, Joint mobilization, Stair training, DME instructions, Manual therapy, and Re-evaluation  PLAN FOR NEXT SESSION: progress postural and lumbopelvic/LE flexibility and strengthening - review/update HEP as needed; progress PWR! Moves   [place], [place] 07/19/2022, 6:07 PM

## 2022-07-25 ENCOUNTER — Encounter: Payer: Medicare Other | Admitting: Physical Therapy

## 2022-07-26 ENCOUNTER — Other Ambulatory Visit: Payer: Self-pay | Admitting: Family

## 2022-07-26 ENCOUNTER — Encounter: Payer: Self-pay | Admitting: Family

## 2022-07-26 ENCOUNTER — Ambulatory Visit (HOSPITAL_BASED_OUTPATIENT_CLINIC_OR_DEPARTMENT_OTHER)
Admission: RE | Admit: 2022-07-26 | Discharge: 2022-07-26 | Disposition: A | Discharge status: Home / Self Care | Payer: Medicare Other | Source: Ambulatory Visit | Attending: Family | Admitting: Family

## 2022-07-26 ENCOUNTER — Telehealth: Payer: Self-pay | Admitting: Family

## 2022-07-26 ENCOUNTER — Ambulatory Visit (INDEPENDENT_AMBULATORY_CARE_PROVIDER_SITE_OTHER): Payer: Medicare Other | Admitting: Family

## 2022-07-26 ENCOUNTER — Ambulatory Visit: Payer: Medicare Other | Admitting: Physical Therapy

## 2022-07-26 ENCOUNTER — Encounter: Payer: Self-pay | Admitting: Physical Therapy

## 2022-07-26 VITALS — BP 107/61 | HR 62 | Ht 76.0 in | Wt 193.3 lb

## 2022-07-26 DIAGNOSIS — R2681 Unsteadiness on feet: Secondary | ICD-10-CM

## 2022-07-26 DIAGNOSIS — J449 Chronic obstructive pulmonary disease, unspecified: Secondary | ICD-10-CM | POA: Diagnosis not present

## 2022-07-26 DIAGNOSIS — R293 Abnormal posture: Secondary | ICD-10-CM

## 2022-07-26 DIAGNOSIS — R062 Wheezing: Secondary | ICD-10-CM

## 2022-07-26 DIAGNOSIS — I951 Orthostatic hypotension: Secondary | ICD-10-CM

## 2022-07-26 DIAGNOSIS — R2689 Other abnormalities of gait and mobility: Secondary | ICD-10-CM

## 2022-07-26 DIAGNOSIS — M6281 Muscle weakness (generalized): Secondary | ICD-10-CM

## 2022-07-26 MED ORDER — TRELEGY ELLIPTA 100-62.5-25 MCG/ACT IN AEPB: 1.0000 | INHALATION_SPRAY | Freq: Every day | RESPIRATORY_TRACT | 11 refills | Status: DC

## 2022-07-26 NOTE — Therapy (Signed)
OUTPATIENT PHYSICAL THERAPY TREATMENT    Patient Name: Victor Osborne MRN: [id number] DOB:1932/06/24, 89 y.o., male Today's Date: 07/26/2022   END OF SESSION:  PT End of Session - 07/26/22 1403     Visit Number 13    Date for PT Re-Evaluation 08/23/22    Authorization Type Medicare & [employer]    PT Start Time 1403    PT Stop Time 1451    PT Time Calculation (min) 48 min    Activity Tolerance Patient tolerated treatment well    Behavior During Therapy WFL for tasks assessed/performed                Past Medical History:  Diagnosis Date   Bipolar 1 disorder (HCC)    COPD (chronic obstructive pulmonary disease) (HCC)    Depression    First degree AV block    Hypertension    pt denies but was in medical record   Hypoglycemia, unspecified    Myalgia and myositis, unspecified    Obesity    Pneumonia 1970   Skin cancer (melanoma) (HCC)    Thyroid disease    Unspecified hypothyroidism    UPJ obstruction, congenital    Past Surgical History:  Procedure Laterality Date   APPENDECTOMY  1939   malignant melanoma removed from right forehead  2008   PERMANENT PACEMAKER INSERTION N/A 06/19/2013   Procedure: PERMANENT PACEMAKER INSERTION;  Surgeon: Marinus Maw, MD;  Location: [facility] CATH LAB;  Service: Cardiovascular;  Laterality: N/A;   right big toe  surgery  2009   TRANSURETHRAL RESECTION OF PROSTATE  02/04/2011   Procedure: TRANSURETHRAL RESECTION OF THE PROSTATE (TURP);  Surgeon: Crecencio Mc, MD;  Location: [facility] ORS;  Service: Urology;  Laterality: N/A;   URETHRA SURGERY  2010   reconstruction   Patient Active Problem List   Diagnosis Date Noted   Hyperlipidemia 03/15/2022   TIA (transient ischemic attack) 03/14/2022   History of neoplasm 04/12/2021   Melanocytic nevi of trunk 04/12/2021   Other seborrheic keratosis 04/12/2021   Cerebral embolism with cerebral infarction 03/31/2021   Left leg weakness 03/30/2021   Chronic post-traumatic stress disorder  12/05/2017   Bipolar I disorder, current or most recent episode depressed, in partial remission (HCC) 12/05/2017   CHB (complete heart block) (HCC) 01/27/2017   HTN (hypertension) 01/27/2017   Hypothyroidism 10/11/2016   Parkinson disease 09/08/2016   Chronic osteoarthritis 03/20/2015   Bipolar 1 disorder (HCC) 03/20/2015   Tremor 03/20/2015   Pacemaker 09/24/2013   SSS (sick sinus syndrome) (HCC) 06/19/2013   First degree AV block 04/09/2013   Allergic rhinitis 03/17/2013   Obstructive sleep apnea 12/18/2006   COPD mixed type (HCC) 12/18/2006   History of malignant melanoma of skin 12/18/2006   Diabetes mellitus (HCC) 12/18/2006    PCP: Olive Bass, FNP   REFERRING PROVIDER: Vladimir Faster, DO   REFERRING DIAG:  G45.9 (ICD-10-CM) - TIA (transient ischemic attack)  G20.A1 (ICD-10-CM) - Parkinson's disease without dyskinesia or fluctuating manifestations   THERAPY DIAG:  Other abnormalities of gait and mobility  Unsteadiness on feet  Muscle weakness (generalized)  Abnormal posture  RATIONALE FOR EVALUATION AND TREATMENT: Rehabilitation  ONSET DATE: 03/14/22 - TIA; Initial PD diagnosis >15 yrs ago  NEXT MD VISIT: 10/26/22   SUBJECTIVE:  SUBJECTIVE STATEMENT: Pt reports he saw his PCP today - chest x-ray ordered for COPD evaluation and he was prescribed an inhaler to use 1x/day.  PAIN: Are you having pain? No  PERTINENT HISTORY:  PPM 2 sick sinus syndrome/SA node dysfunction & complete heart block, orthostatic hypotension, COPD, OSA, HTN, DM2, Parkinson's Disease, CVA 03/2021, hypothyroidism, PTSD, bipolar I disorder   PRECAUTIONS: Fall and ICD/Pacemaker  WEIGHT BEARING RESTRICTIONS: No  FALLS:  Has patient fallen in last 6 months? No  LIVING  ENVIRONMENT: Lives with: lives alone and with his dog Lives in: Other Independent living facility (3rd floor) Stairs:  Elevator Has following equipment at home: Single point cane, Environmental consultant - 4 wheeled, and hiking pole  OCCUPATION: Retired  PLOF: Independent and Leisure: being outdoors - walking his dog, and stays active most of the day  PATIENT GOALS: "Improve my hand-eye coordination and especially balance."   OBJECTIVE: (objective measures completed at initial evaluation unless otherwise dated)  DIAGNOSTIC FINDINGS:  03/14/22 & 03/15/22 - CT Head w/o contrast:  IMPRESSION: 1. No evidence of acute intracranial abnormality. 2. Chronic small vessel ischemic disease and cerebral atrophy. 03/14/22 - CT Angio Head & Neck: IMPRESSION: 1. Negative CTA for large vessel occlusion or other emergent finding. 2. Mild for age atheromatous change about the carotid bifurcations and carotid siphons without hemodynamically significant stenosis.  COGNITION: Overall cognitive status: Within functional limits for tasks assessed   SENSATION: WFL Except neuropathy in his feet - most notable at night  COORDINATION: Gross motor WFL B UE/LE  MUSCLE LENGTH: Hamstrings: mod tight B ITB: mod tight L, mild tight R Piriformis: mild tight B Hip flexors: mod tight B Quads: mod tight B Heelcord: mod tight L, mild tight R  POSTURE:  rounded shoulders, forward head, and flexed trunk   LOWER EXTREMITY ROM:    Grossly WFL  LOWER EXTREMITY MMT:    MMT Right Eval Left Eval R 07/14/22 L 07/14/22  Hip flexion 4 4 4+ 4+  Hip extension 4 4 4 4   Hip abduction 4+ 4+ 4+ 4+  Hip adduction 4+ 4+ 4+ 5  Hip internal rotation 4+ 4+ 5 5  Hip external rotation 4 4 4 4   Knee flexion 5 5 5 5   Knee extension 5 5 5 5   Ankle dorsiflexion 4+ 4+ 5 4+  Ankle plantarflexion      Ankle inversion      Ankle eversion      (Blank rows = not tested)  BED MOBILITY:  Sit to supine Complete Independence Supine to sit  Complete Independence Rolling to Right Complete Independence  TRANSFERS: Assistive device utilized: None  Sit to stand: Complete Independence Stand to sit: Complete Independence Chair to chair: Modified independence Floor:  NT  GAIT: Distance walked: 100 ft Assistive device utilized: Environmental consultant - 4 wheeled and None Level of assistance: Modified independence and SBA Gait pattern: step through pattern, decreased arm swing- Right, decreased arm swing- Left, and trunk flexed Comments: more pronounced fwd head, flexed trunk posture noted than on previous PT episodes  RAMP: Level of Assistance:  NT Assistive device utilized:  NT Ramp Comments:   CURB:  Level of Assistance:  NT Assistive device utilized:  NT Curb Comments:   STAIRS:  Level of Assistance: SBA  Stair Negotiation Technique: Alternating Pattern  with Single Rail on Right  Number of Stairs: 14   Height of Stairs: 7"  Comments: Pt ascends stairs with weight mostly through ball of foot on step with heel hanging  over edge of step causing slight drop and posterior shift at times, but no LOB due to grip on railing  FUNCTIONAL TESTS:  5 times sit to stand: 15.96 sec Timed up and go (TUG): Normal = 12.56 sec with rollator, 9.09 sec w/o AD; Manual = 9.32 sec; Cognitive = 14.34 sec 10 meter walk test: 7.97 sec with rollator, 7.59 sec w/o AD; Gait speed = 4.12 ft/sec with rollator, 4.32 ft/sec w/o AD,  Berg Balance Scale: 43/56; 37-45 = significant risk for falls (>80%)  (06/09/22) Functional gait assessment: 17/30; < 19 = high risk fall  06/27/22: 5xSTS = 16.25 sec  06/29/22: 5xSTS = 15 sec  07/12/22:  5xSTS = 13.65 sec with slight UE assist, 12.87 sec w/o UE assist Berg = 51/56; 46-51 moderate risk for falls (>50%)   07/14/22: : 7.03 sec w/o AD Gait speed: 4.67 ft/sec w/o AD FGA: 23/30  Scores as of discharge from last PT episode - December 2023: 5 times sit to stand: 12.13 sec Timed up and go (TUG): Normal = 10.45  sec; Manual = 11.45 sec; Cognitive = 15.15 sec 10 meter walk test: 7.5 sec (no AD); Gait speed = 4.37 ft/sec Berg: 53/56; 52-55 lower (> 25%) fall risk Functional gait assessment: 27/30; 25-28 = low risk fall   PATIENT SURVEYS:  ABC scale 1240 / 1600 = 77.5 %   TODAY'S TREATMENT:              07/26/22 THERAPEUTIC EXERCISE: to improve flexibility, strength and mobility.  Verbal and tactile cues throughout for technique. NuStep - L5 x 6 min (UE/LE to promote improved reciprocal movement patterns) Wall pushups on fists x 10 Serratus pushup on wall x 10  NEUROMUSCULAR RE-EDUCATION: To improve posture, balance, proprioception, coordination, reduce fall risk, amplitude of movement, speed of movement to reduce bradykinesia, and reduce rigidity.  Prone PWR! Moves - Up x 8, Rock, Twist & Step x 10 - modified with pillow under abdomen/pelvis Quadruped/All 4's PWR! Moves - Up, Rock, Twist & Step x 10 - modified with hands and torso elevated using hands on seat of chair   07/19/22 THERAPEUTIC EXERCISE: to improve flexibility, strength and mobility.  Verbal and tactile cues throughout for technique. NuStep - L5 x 6 min (UE/LE to promote improved reciprocal movement patterns) TRX squat with light glute touch to chair x 10, with hover just above chair 10 x 3" TRX lateral lunge x 10  NEUROMUSCULAR RE-EDUCATION: To improve posture, balance, coordination, reduce fall risk, amplitude of movement, speed of movement to reduce bradykinesia, and reduce rigidity. Quadruped/All 4's PWR! Moves - Up, Rock, Twist & Step x 10 - modified with hands and torso elevated using hands on seat of chair Prone PWR! Moves - Up, Rock, [place] & Step x 5-8 - modified with pillow under abdomen/pelvis   07/14/22 THERAPEUTIC EXERCISE: to improve flexibility, strength and mobility.  Verbal and tactile cues throughout for technique. NuStep - L5 x 6 min (UE/LE to promote improved reciprocal movement patterns) Eccentric squat 2 x 5  (hovering over mat table)  THERAPEUTIC ACTIVITIES: : 7.03 sec w/o AD Gait speed: 4.67 ft/sec w/o AD FGA: 23/30 LE MMT  GAIT TRAINING: To normalize gait pattern and improve safety with SPC . Distance walked: 800 ft Assistive device utilized: Single point cane Level of assistance: Modified independence Gait pattern: trunk flexed, poor foot clearance- Right, and poor foot clearance- Left Comments: Gait assessed on indoor and outdoor surfaces including grass, sidewalks, pavement, inclines and curbs.  Cues  at time for increased foot clearance but no LOB observed. Stairs: Level of Assistance: Modified independence and SBA Stair Negotiation Technique: Alternating Pattern  With use of AD: SPC  with Single Rail on Left Number of Stairs: 14 x 2  Height of Stairs: 7"  Comments: Patient able to navigate stairs reciprocally however requires single Rail support for balance.  LOB occurred on last step on descent due to patient misjudging step but able to self-correct and prevent fall.  On stair ascent he continues to require cueing to ensure majority of foot supported on step to reduce posterior LOB from weight shift over back/head of step.   PATIENT EDUCATION:  Education details: HEP update - serratus and wall pushups   Person educated: Patient Education method: Explanation, Demonstration, Verbal cues, and Handouts Education comprehension: verbalized understanding, returned demonstration, verbal cues required, and needs further education  HOME EXERCISE PROGRAM: Access Code: [email or web address] URL: [email or web address] Date: 07/26/2022 Prepared by: Glenetta Hew  Exercises - Seated Hamstring Stretch  - 2 x daily - 7 x weekly - 3 reps - 30 sec hold - Seated Figure 4 Piriformis Stretch  - 2 x daily - 7 x weekly - 3 reps - 30 sec hold - Standing Hip Flexor Stretch  - 2 x daily - 7 x weekly - 3 reps - 30 sec hold - Standing ITB Stretch  - 2 x daily - 7 x weekly - 3 reps - 30 sec hold -  Marching with Resistance  - 1 x daily - 7 x weekly - 2 sets - 10 reps - 3 sec hold - Standing Hip Extension with Resistance at Ankles and Counter Support  - 1 x daily - 7 x weekly - 2 sets - 10 reps - 3 sec hold - Wall Push Up  - 1 x daily - 7 x weekly - 2 sets - 10 reps - 3 sec hold - Serratus Forearm Push Up Plus at Wall with Elbows  - 1 x daily - 7 x weekly - 2 sets - 10 reps - 3 sec hold  Patient Education - Postural Hypotension - Orthostatic Hypotension  PWR! Moves - Seated - Supine - Standing - Quadruped/All 4's - Prone  Access Code: [email or web address] from previous episode   ASSESSMENT:  CLINICAL IMPRESSION: Petra Kuba" requesting to review the prone and quadruped PWR! Moves as he feels that these are still the most challenging. Continued modifications necessary but pt demonstrating improving endurance and now able to complete 10 reps for all moves except prone PWR! Up. Provided instruction in wall push-ups as a way to improve ability to support body weight on arms for prone and quadruped activities. Victor Osborne will continue to benefit from both upper body and proximal LE strengthening to improve stability and activity tolerance to reduce risk for falls.  OBJECTIVE IMPAIRMENTS: Abnormal gait, decreased activity tolerance, decreased balance, decreased coordination, decreased endurance, decreased mobility, difficulty walking, decreased strength, decreased safety awareness, impaired perceived functional ability, impaired flexibility, improper body mechanics, and postural dysfunction.   ACTIVITY LIMITATIONS: carrying, lifting, standing, squatting, stairs, transfers, bed mobility, reach over head, locomotion level, and caring for others  PARTICIPATION LIMITATIONS: meal prep, cleaning, laundry, driving, shopping, and community activity  PERSONAL FACTORS: Age, Fitness, Past/current experiences, Time since onset of injury/illness/exacerbation, and 3+ comorbidities: PPM 2 sick sinus syndrome/SA node  dysfunction & complete heart block, orthostatic hypotension, COPD, OSA, HTN, DM2, Parkinson's Disease, CVA 03/2021, hypothyroidism, PTSD, bipolar I disorder   are also affecting patient's functional outcome.  REHAB POTENTIAL: Good  CLINICAL DECISION MAKING: Evolving/moderate complexity  EVALUATION COMPLEXITY: Moderate   GOALS: Goals reviewed with patient? Yes  SHORT TERM GOALS: Target date: 06/28/2022  Patient will be independent with initial HEP. Baseline:  Goal status: MET  06/16/22   2.  Patient will improve 5x STS time to </= 14 seconds to demonstrate improved functional strength and transfer efficiency.. Baseline: 15.96 sec Goal status: MET  07/12/22 - 13.65 seconds with UE assist, 12.87 sec w/o UE assist  3.  Patient will be educated on strategies to decrease risk of falls.  Baseline:  Goal status: MET  06/27/22  LONG TERM GOALS: Target date: 07/19/2022, extended to 08/23/2022  Patient will be independent with ongoing/advanced HEP for self-management at home incorporating PWR! Moves as indicated .  Baseline:  Goal status: IN PROGRESS  07/26/22 - HEP updated today  2.  Patient will be able to ambulate at least 800 ft with LRAD including outdoor and unlevel surfaces, independently with no LOB, for improved community gait. Baseline:  Goal status: MET  07/14/22  3.  Patient will be able to step up/down curb safely with LRAD for safety with community ambulation.  Baseline:  Goal status: MET  07/14/22   4.  Patient will demonstrate at least 25/30 on FGA to improve gait stability and reduce risk for falls. (MCID = 4 points) Baseline: 17/30 Goal status: IN PROGRESS  07/14/22 - 23/30  5.  Patient will improve Berg score to >/= 52/56 to improve safety and stability with ADLs in standing and reduce risk for falls. (MCID = 8 points)   Baseline: TBD Goal status: IN PROGRESS  07/12/22 - 51/56  6.  Patient will report >/= 85% on ABC scale to demonstrate improved balance confidence and  decreased risk for falls. Baseline: 1240 / 1600 = 77.5 % Goal status: IN PROGRESS  7. Patient will verbalize understanding of local Parkinson's disease community resources, including community fitness post d/c. Baseline:  Goal status: IN PROGRESS   PLAN:  PT FREQUENCY: 2x/week  PT DURATION: 6 weeks  PLANNED INTERVENTIONS: Therapeutic exercises, Therapeutic activity, Neuromuscular re-education, Balance training, Gait training, Patient/Family education, Self Care, Joint mobilization, Stair training, DME instructions, Manual therapy, and Re-evaluation  PLAN FOR NEXT SESSION: progress postural and lumbopelvic/LE flexibility and strengthening - review/update HEP as needed; progress PWR! Moves   Shorter, Pickett 07/26/2022, 3:00 PM

## 2022-07-26 NOTE — Progress Notes (Signed)
Victor Osborne is a 89 y.o. male with the following history as recorded in Epic[employer]:  Patient Active Problem List   Diagnosis Date Noted   Hyperlipidemia 03/15/2022   TIA (transient ischemic attack) 03/14/2022   History of neoplasm 04/12/2021   Melanocytic nevi of trunk 04/12/2021   Other seborrheic keratosis 04/12/2021   Cerebral embolism with cerebral infarction 03/31/2021   Left leg weakness 03/30/2021   Chronic post-traumatic stress disorder 12/05/2017   Bipolar I disorder, current or most recent episode depressed, in partial remission (HCC) 12/05/2017   CHB (complete heart block) (HCC) 01/27/2017   HTN (hypertension) 01/27/2017   Hypothyroidism 10/11/2016   Parkinson disease 09/08/2016   Chronic osteoarthritis 03/20/2015   Bipolar 1 disorder (HCC) 03/20/2015   Tremor 03/20/2015   Pacemaker 09/24/2013   SSS (sick sinus syndrome) (HCC) 06/19/2013   First degree AV block 04/09/2013   Allergic rhinitis 03/17/2013   Obstructive sleep apnea 12/18/2006   COPD mixed type (HCC) 12/18/2006   History of malignant melanoma of skin 12/18/2006   Diabetes mellitus (HCC) 12/18/2006    Current Outpatient Medications  Medication Sig Dispense Refill   acetaminophen (TYLENOL) 325 MG tablet Take 650 mg by mouth every 6 (six) hours as needed for mild pain or headache.     alclomethasone (ACLOVATE) 0.05 % ointment Apply 1 application topically 2 (two) times daily as needed (for rash).     carbamazepine (TEGRETOL) 200 MG tablet Take 1 tablet (200 mg total) by mouth 3 (three) times daily. 270 tablet 1   carbidopa-levodopa (SINEMET IR) 25-100 MG tablet Take 2 tablets by mouth 3 (three) times daily. 7am/11am/4pm 540 tablet 0   cholecalciferol (VITAMIN D) 1000 UNITS tablet Take 2,000 Units by mouth daily.     clopidogrel (PLAVIX) 75 MG tablet Take 1 tablet (75 mg total) by mouth daily. 90 tablet 3   Coenzyme Q10 (CO Q 10 PO) Take 1 capsule by mouth daily.     ferrous sulfate 324 (65 Fe) MG TBEC  Take 324 mg by mouth daily.     Flaxseed, Linseed, (FLAX SEED OIL) 1300 MG CAPS Take 1 capsule by mouth daily.     FLUoxetine (PROZAC) 10 MG capsule TAKE 1 CAPSULE EVERY DAY 90 capsule 0   Fluticasone-Umeclidin-Vilant (TRELEGY ELLIPTA) 100-62.5-25 MCG/ACT AEPB Inhale 1 puff into the lungs daily. 1 each 11   levothyroxine (LEVOTHROID) 125 MCG tablet Take 1 tablet (125 mcg total) by mouth daily before breakfast. 90 tablet 3   MAGNESIUM PO Take 1 capsule by mouth daily.     Multiple Vitamin (MULITIVITAMIN WITH MINERALS) TABS Take 1 tablet by mouth daily.     Omega-3 Fatty Acids (OMEGA-3 CF PO) Take 1,200 mg by mouth daily.     POTASSIUM GLUCONATE PO Take 1 capsule by mouth daily.     pravastatin (PRAVACHOL) 80 MG tablet Take 1 tablet (80 mg total) by mouth daily. 90 tablet 1   QUEtiapine (SEROQUEL XR) 50 MG TB24 24 hr tablet Take 1 tablet (50 mg total) by mouth at bedtime. 90 tablet 1   tamsulosin (FLOMAX) 0.4 MG CAPS capsule Take 0.4 mg by mouth at bedtime.     No current facility-administered medications for this visit.    Allergies: Patient has no known allergies.  Past Medical History:  Diagnosis Date   Bipolar 1 disorder (HCC)    COPD (chronic obstructive pulmonary disease) (HCC)    Depression    First degree AV block    Hypertension    pt  denies but was in medical record   Hypoglycemia, unspecified    Myalgia and myositis, unspecified    Obesity    Pneumonia 1970   Skin cancer (melanoma) (HCC)    Thyroid disease    Unspecified hypothyroidism    UPJ obstruction, congenital     Past Surgical History:  Procedure Laterality Date   APPENDECTOMY  1939   malignant melanoma removed from right forehead  2008   PERMANENT PACEMAKER INSERTION N/A 06/19/2013   Procedure: PERMANENT PACEMAKER INSERTION;  Surgeon: Marinus Maw, MD;  Location: [facility] CATH LAB;  Service: Cardiovascular;  Laterality: N/A;   right big toe  surgery  2009   TRANSURETHRAL RESECTION OF PROSTATE  02/04/2011    Procedure: TRANSURETHRAL RESECTION OF THE PROSTATE (TURP);  Surgeon: Crecencio Mc, MD;  Location: [facility] ORS;  Service: Urology;  Laterality: N/A;   URETHRA SURGERY  2010   reconstruction    Family History  Problem Relation Age of Onset   Cerebral aneurysm Mother    Lung cancer Father    Drug abuse Daughter     Social History   Tobacco Use   Smoking status: Former    Packs/day: 2.00    Years: 40.00    Additional pack years: 0.00    Total pack years: 80.00    Types: Cigarettes   Smokeless tobacco: Never  Substance Use Topics   Alcohol use: No    Subjective:   Concerned for chest congestion x "months"; asking to have updated CXR today; feels like symptoms may be worsening; can hear myself "rattling" at night when I lay down;  Per medical  records, does have history of COPD; does not have current prescription for inhaler;   Objective:  Vitals:   07/26/22 0919  BP: 107/61  Pulse: 62  SpO2: 98%  Weight: 193 lb 4.8 oz (87.7 kg)  Height: 6\' 4"  (1.93 m)    General: Well developed, well nourished, in no acute distress  Skin : Warm and dry.  Head: Normocephalic and atraumatic  Lungs: Respirations unlabored; clear to auscultation bilaterally without wheeze, rales, rhonchi  CVS exam: normal rate and regular rhythm.  Neurologic: Alert and oriented; speech intact; face symmetrical; uses cane to help with walking;   Assessment:  1. Wheezing   2. COPD mixed type [facility])     Plan:  Physical exam of lungs is reassuring today; update CXR today; Rx for Trelegy for patient to try; follow up to be determined.   No follow-ups on file.  Orders Placed This Encounter  Procedures   DG Chest 2 View    Standing Status:   Future    Number of Occurrences:   1    Standing Expiration Date:   01/26/2023    Order Specific Question:   Reason for Exam (SYMPTOM  OR DIAGNOSIS REQUIRED)    Answer:   wheezing/ chest congestion; rule out pneumonia    Order Specific Question:   Preferred imaging location?     Answer:   [facility]    Requested Prescriptions   Signed Prescriptions Disp Refills   Fluticasone-Umeclidin-Vilant (TRELEGY ELLIPTA) 100-62.5-25 MCG/ACT AEPB 1 each 11    Sig: Inhale 1 puff into the lungs daily.

## 2022-07-26 NOTE — Telephone Encounter (Signed)
From has been given to PCP.

## 2022-07-26 NOTE — Telephone Encounter (Signed)
Pt dropped off copy of Measuring Orthostatic Blood Pressure info (1 page) for provider to have on pt's chart. Copy put at front office tray under providers name.

## 2022-07-28 ENCOUNTER — Encounter: Payer: Self-pay | Admitting: Physical Therapy

## 2022-07-28 ENCOUNTER — Ambulatory Visit: Payer: Medicare Other | Admitting: Physical Therapy

## 2022-07-28 DIAGNOSIS — R2681 Unsteadiness on feet: Secondary | ICD-10-CM

## 2022-07-28 DIAGNOSIS — M6281 Muscle weakness (generalized): Secondary | ICD-10-CM

## 2022-07-28 DIAGNOSIS — R2689 Other abnormalities of gait and mobility: Secondary | ICD-10-CM | POA: Diagnosis not present

## 2022-07-28 DIAGNOSIS — R293 Abnormal posture: Secondary | ICD-10-CM

## 2022-07-28 NOTE — Therapy (Signed)
OUTPATIENT PHYSICAL THERAPY TREATMENT    Patient Name: Sahan Pen MRN: [id number] DOB:1932/10/30, 89 y.o., male Today's Date: 07/28/2022   END OF SESSION:  PT End of Session - 07/28/22 1351     Visit Number 14    Date for PT Re-Evaluation 08/23/22    Authorization Type Medicare & [employer]    PT Start Time 1351    PT Stop Time 1440    PT Time Calculation (min) 49 min    Activity Tolerance Patient tolerated treatment well    Behavior During Therapy WFL for tasks assessed/performed                Past Medical History:  Diagnosis Date   Bipolar 1 disorder (HCC)    COPD (chronic obstructive pulmonary disease) (HCC)    Depression    First degree AV block    Hypertension    pt denies but was in medical record   Hypoglycemia, unspecified    Myalgia and myositis, unspecified    Obesity    Pneumonia 1970   Skin cancer (melanoma) (HCC)    Thyroid disease    Unspecified hypothyroidism    UPJ obstruction, congenital    Past Surgical History:  Procedure Laterality Date   APPENDECTOMY  1939   malignant melanoma removed from right forehead  2008   PERMANENT PACEMAKER INSERTION N/A 06/19/2013   Procedure: PERMANENT PACEMAKER INSERTION;  Surgeon: Marinus Maw, MD;  Location: [facility] CATH LAB;  Service: Cardiovascular;  Laterality: N/A;   right big toe  surgery  2009   TRANSURETHRAL RESECTION OF PROSTATE  02/04/2011   Procedure: TRANSURETHRAL RESECTION OF THE PROSTATE (TURP);  Surgeon: Crecencio Mc, MD;  Location: [facility] ORS;  Service: Urology;  Laterality: N/A;   URETHRA SURGERY  2010   reconstruction   Patient Active Problem List   Diagnosis Date Noted   Hyperlipidemia 03/15/2022   TIA (transient ischemic attack) 03/14/2022   History of neoplasm 04/12/2021   Melanocytic nevi of trunk 04/12/2021   Other seborrheic keratosis 04/12/2021   Cerebral embolism with cerebral infarction 03/31/2021   Left leg weakness 03/30/2021   Chronic post-traumatic stress disorder  12/05/2017   Bipolar I disorder, current or most recent episode depressed, in partial remission (HCC) 12/05/2017   CHB (complete heart block) (HCC) 01/27/2017   HTN (hypertension) 01/27/2017   Hypothyroidism 10/11/2016   Parkinson disease 09/08/2016   Chronic osteoarthritis 03/20/2015   Bipolar 1 disorder (HCC) 03/20/2015   Tremor 03/20/2015   Pacemaker 09/24/2013   SSS (sick sinus syndrome) (HCC) 06/19/2013   First degree AV block 04/09/2013   Allergic rhinitis 03/17/2013   Obstructive sleep apnea 12/18/2006   COPD mixed type (HCC) 12/18/2006   History of malignant melanoma of skin 12/18/2006   Diabetes mellitus (HCC) 12/18/2006    PCP: Olive Bass, FNP   REFERRING PROVIDER: Vladimir Faster, DO   REFERRING DIAG:  G45.9 (ICD-10-CM) - TIA (transient ischemic attack)  G20.A1 (ICD-10-CM) - Parkinson's disease without dyskinesia or fluctuating manifestations   THERAPY DIAG:  Other abnormalities of gait and mobility  Unsteadiness on feet  Muscle weakness (generalized)  Abnormal posture  RATIONALE FOR EVALUATION AND TREATMENT: Rehabilitation  ONSET DATE: 03/14/22 - TIA; Initial PD diagnosis >15 yrs ago  NEXT MD VISIT: 10/26/22   SUBJECTIVE:  SUBJECTIVE STATEMENT: Pt reports "nothing new or exciting".  PAIN: Are you having pain? No  PERTINENT HISTORY:  PPM 2 sick sinus syndrome/SA node dysfunction & complete heart block, orthostatic hypotension, COPD, OSA, HTN, DM2, Parkinson's Disease, CVA 03/2021, hypothyroidism, PTSD, bipolar I disorder   PRECAUTIONS: Fall and ICD/Pacemaker  WEIGHT BEARING RESTRICTIONS: No  FALLS:  Has patient fallen in last 6 months? No  LIVING ENVIRONMENT: Lives with: lives alone and with his dog Lives in: Other Independent living  facility (3rd floor) Stairs:  Elevator Has following equipment at home: Single point cane, Environmental consultant - 4 wheeled, and hiking pole  OCCUPATION: Retired  PLOF: Independent and Leisure: being outdoors - walking his dog, and stays active most of the day  PATIENT GOALS: "Improve my hand-eye coordination and especially balance."   OBJECTIVE: (objective measures completed at initial evaluation unless otherwise dated)  DIAGNOSTIC FINDINGS:  03/14/22 & 03/15/22 - CT Head w/o contrast:  IMPRESSION: 1. No evidence of acute intracranial abnormality. 2. Chronic small vessel ischemic disease and cerebral atrophy. 03/14/22 - CT Angio Head & Neck: IMPRESSION: 1. Negative CTA for large vessel occlusion or other emergent finding. 2. Mild for age atheromatous change about the carotid bifurcations and carotid siphons without hemodynamically significant stenosis.  COGNITION: Overall cognitive status: Within functional limits for tasks assessed   SENSATION: WFL Except neuropathy in his feet - most notable at night  COORDINATION: Gross motor WFL B UE/LE  MUSCLE LENGTH: Hamstrings: mod tight B ITB: mod tight L, mild tight R Piriformis: mild tight B Hip flexors: mod tight B Quads: mod tight B Heelcord: mod tight L, mild tight R  POSTURE:  rounded shoulders, forward head, and flexed trunk   LOWER EXTREMITY ROM:    Grossly WFL  LOWER EXTREMITY MMT:    MMT Right Eval Left Eval R 07/14/22 L 07/14/22  Hip flexion 4 4 4+ 4+  Hip extension 4 4 4 4   Hip abduction 4+ 4+ 4+ 4+  Hip adduction 4+ 4+ 4+ 5  Hip internal rotation 4+ 4+ 5 5  Hip external rotation 4 4 4 4   Knee flexion 5 5 5 5   Knee extension 5 5 5 5   Ankle dorsiflexion 4+ 4+ 5 4+  Ankle plantarflexion      Ankle inversion      Ankle eversion      (Blank rows = not tested)  BED MOBILITY:  Sit to supine Complete Independence Supine to sit Complete Independence Rolling to Right Complete Independence  TRANSFERS: Assistive device  utilized: None  Sit to stand: Complete Independence Stand to sit: Complete Independence Chair to chair: Modified independence Floor:  NT  GAIT: Distance walked: 100 ft Assistive device utilized: Environmental consultant - 4 wheeled and None Level of assistance: Modified independence and SBA Gait pattern: step through pattern, decreased arm swing- Right, decreased arm swing- Left, and trunk flexed Comments: more pronounced fwd head, flexed trunk posture noted than on previous PT episodes  RAMP: Level of Assistance:  NT Assistive device utilized:  NT Ramp Comments:   CURB:  Level of Assistance:  NT Assistive device utilized:  NT Curb Comments:   STAIRS:  Level of Assistance: SBA  Stair Negotiation Technique: Alternating Pattern  with Single Rail on Right  Number of Stairs: 14   Height of Stairs: 7"  Comments: Pt ascends stairs with weight mostly through ball of foot on step with heel hanging over edge of step causing slight drop and posterior shift at times, but no LOB due to  grip on railing  FUNCTIONAL TESTS:  5 times sit to stand: 15.96 sec Timed up and go (TUG): Normal = 12.56 sec with rollator, 9.09 sec w/o AD; Manual = 9.32 sec; Cognitive = 14.34 sec 10 meter walk test: 7.97 sec with rollator, 7.59 sec w/o AD; Gait speed = 4.12 ft/sec with rollator, 4.32 ft/sec w/o AD,  Berg Balance Scale: 43/56; 37-45 = significant risk for falls (>80%)  (06/09/22) Functional gait assessment: 17/30; < 19 = high risk fall  06/27/22: 5xSTS = 16.25 sec  06/29/22: 5xSTS = 15 sec  07/12/22:  5xSTS = 13.65 sec with slight UE assist, 12.87 sec w/o UE assist Berg = 51/56; 46-51 moderate risk for falls (>50%)   07/14/22: : 7.03 sec w/o AD Gait speed: 4.67 ft/sec w/o AD FGA: 23/30  Scores as of discharge from last PT episode - December 2023: 5 times sit to stand: 12.13 sec Timed up and go (TUG): Normal = 10.45 sec; Manual = 11.45 sec; Cognitive = 15.15 sec 10 meter walk test: 7.5 sec (no AD); Gait  speed = 4.37 ft/sec Berg: 53/56; 52-55 lower (> 25%) fall risk Functional gait assessment: 27/30; 25-28 = low risk fall   PATIENT SURVEYS:  ABC scale 1240 / 1600 = 77.5 %   TODAY'S TREATMENT:              07/28/22 THERAPEUTIC EXERCISE: to improve flexibility, strength and mobility.  Verbal and tactile cues throughout for technique. NuStep - L6 x 6 min (UE/LE to promote improved reciprocal movement patterns) S/L RTB clam 2 x 10 bil S/L hip ABD with RTB just above knees 2 x 10 Bridge + RTB clam 2 x 10 RTB brace marching 2 x 10 Partial squat with RTB hip ABD isometric x 10  NEUROMUSCULAR RE-EDUCATION: To improve posture, balance, proprioception, coordination, reduce fall risk, amplitude of movement, speed of movement to reduce bradykinesia, and reduce rigidity. Retro PWR! Step backwards x 10 each LE - positioned between the backs of 2 chairs but pt only minimally using chairs for support PWR! Step forwards x 10 each LE - positioned between the backs of 2 chairs but pt only minimally using chairs for support Unable to attempt PWR! Step through due to c/o lightheadedness  - VS checked: BP 124/62, HR 60, O2 sats 97-98%   07/26/22 THERAPEUTIC EXERCISE: to improve flexibility, strength and mobility.  Verbal and tactile cues throughout for technique. NuStep - L5 x 6 min (UE/LE to promote improved reciprocal movement patterns) Wall pushups on fists x 10 Serratus pushup on wall x 10  NEUROMUSCULAR RE-EDUCATION: To improve posture, balance, proprioception, coordination, reduce fall risk, amplitude of movement, speed of movement to reduce bradykinesia, and reduce rigidity.  Prone PWR! Moves - Up x 8, Rock, Twist & Step x 10 - modified with pillow under abdomen/pelvis Quadruped/All 4's PWR! Moves - Up, Rock, Twist & Step x 10 - modified with hands and torso elevated using hands on seat of chair   07/19/22 THERAPEUTIC EXERCISE: to improve flexibility, strength and mobility.  Verbal and tactile cues  throughout for technique. NuStep - L5 x 6 min (UE/LE to promote improved reciprocal movement patterns) TRX squat with light glute touch to chair x 10, with hover just above chair 10 x 3" TRX lateral lunge x 10  NEUROMUSCULAR RE-EDUCATION: To improve posture, balance, coordination, reduce fall risk, amplitude of movement, speed of movement to reduce bradykinesia, and reduce rigidity. Quadruped/All 4's PWR! Moves - Up, Rock, Twist & Step x 10 -  modified with hands and torso elevated using hands on seat of chair Prone PWR! Moves - Up, Rock, [employer] & Step x 5-8 - modified with pillow under abdomen/pelvis   PATIENT EDUCATION:  Education details:  review of measure to manage orthostatic/postural hypotension   Person educated: Patient Education method: Programmer, multimedia, Demonstration, Verbal cues, and Handouts Education comprehension: verbalized understanding, returned demonstration, verbal cues required, and needs further education  HOME EXERCISE PROGRAM: Access Code: [email or web address] URL: [email or web address] Date: 07/26/2022 Prepared by: Glenetta Hew  Exercises - Seated Hamstring Stretch  - 2 x daily - 7 x weekly - 3 reps - 30 sec hold - Seated Figure 4 Piriformis Stretch  - 2 x daily - 7 x weekly - 3 reps - 30 sec hold - Standing Hip Flexor Stretch  - 2 x daily - 7 x weekly - 3 reps - 30 sec hold - Standing ITB Stretch  - 2 x daily - 7 x weekly - 3 reps - 30 sec hold - Marching with Resistance  - 1 x daily - 7 x weekly - 2 sets - 10 reps - 3 sec hold - Standing Hip Extension with Resistance at Ankles and Counter Support  - 1 x daily - 7 x weekly - 2 sets - 10 reps - 3 sec hold - Wall Push Up  - 1 x daily - 7 x weekly - 2 sets - 10 reps - 3 sec hold - Serratus Forearm Push Up Plus at Wall with Elbows  - 1 x daily - 7 x weekly - 2 sets - 10 reps - 3 sec hold  Patient Education - Postural Hypotension - Orthostatic Hypotension  PWR! Moves - Seated - Supine - Standing - Quadruped/All  4's - Prone  Access Code: [email or web address] from previous episode   ASSESSMENT:  CLINICAL IMPRESSION: Attempted to progress PWR Step patterns today but limited by c/o fatigue and lightheadedness requiring frequent rest breaks and eventually shift of focus to more proximal LE strengthening. VS checked with all VS WNL.  Patient provided with bottle of water to promote increased hydration and shifted focus for remainder of session to core/proximal LE strengthening at mat table level.  OBJECTIVE IMPAIRMENTS: Abnormal gait, decreased activity tolerance, decreased balance, decreased coordination, decreased endurance, decreased mobility, difficulty walking, decreased strength, decreased safety awareness, impaired perceived functional ability, impaired flexibility, improper body mechanics, and postural dysfunction.   ACTIVITY LIMITATIONS: carrying, lifting, standing, squatting, stairs, transfers, bed mobility, reach over head, locomotion level, and caring for others  PARTICIPATION LIMITATIONS: meal prep, cleaning, laundry, driving, shopping, and community activity  PERSONAL FACTORS: Age, Fitness, Past/current experiences, Time since onset of injury/illness/exacerbation, and 3+ comorbidities: PPM 2 sick sinus syndrome/SA node dysfunction & complete heart block, orthostatic hypotension, COPD, OSA, HTN, DM2, Parkinson's Disease, CVA 03/2021, hypothyroidism, PTSD, bipolar I disorder   are also affecting patient's functional outcome.   REHAB POTENTIAL: Good  CLINICAL DECISION MAKING: Evolving/moderate complexity  EVALUATION COMPLEXITY: Moderate   GOALS: Goals reviewed with patient? Yes  SHORT TERM GOALS: Target date: 06/28/2022  Patient will be independent with initial HEP. Baseline:  Goal status: MET  06/16/22   2.  Patient will improve 5x STS time to </= 14 seconds to demonstrate improved functional strength and transfer efficiency.. Baseline: 15.96 sec Goal status: MET  07/12/22 - 13.65 seconds with  UE assist, 12.87 sec w/o UE assist  3.  Patient will be educated on strategies to decrease risk of falls.  Baseline:  Goal status: MET  06/27/22  LONG  TERM GOALS: Target date: 07/19/2022, extended to 08/23/2022  Patient will be independent with ongoing/advanced HEP for self-management at home incorporating PWR! Moves as indicated .  Baseline:  Goal status: IN PROGRESS  07/26/22 - HEP updated today  2.  Patient will be able to ambulate at least 800 ft with LRAD including outdoor and unlevel surfaces, independently with no LOB, for improved community gait. Baseline:  Goal status: MET  07/14/22  3.  Patient will be able to step up/down curb safely with LRAD for safety with community ambulation.  Baseline:  Goal status: MET  07/14/22   4.  Patient will demonstrate at least 25/30 on FGA to improve gait stability and reduce risk for falls. (MCID = 4 points) Baseline: 17/30 Goal status: IN PROGRESS  07/14/22 - 23/30  5.  Patient will improve Berg score to >/= 52/56 to improve safety and stability with ADLs in standing and reduce risk for falls. (MCID = 8 points)   Baseline: TBD Goal status: IN PROGRESS  07/12/22 - 51/56  6.  Patient will report >/= 85% on ABC scale to demonstrate improved balance confidence and decreased risk for falls. Baseline: 1240 / 1600 = 77.5 % Goal status: IN PROGRESS  7. Patient will verbalize understanding of local Parkinson's disease community resources, including community fitness post d/c. Baseline:  Goal status: IN PROGRESS   PLAN:  PT FREQUENCY: 2x/week  PT DURATION: 6 weeks  PLANNED INTERVENTIONS: Therapeutic exercises, Therapeutic activity, Neuromuscular re-education, Balance training, Gait training, Patient/Family education, Self Care, Joint mobilization, Stair training, DME instructions, Manual therapy, and Re-evaluation  PLAN FOR NEXT SESSION: progress postural and lumbopelvic/LE flexibility and strengthening - review/update HEP as needed; progress PWR!  Moves   [place], [place] 07/28/2022, 7:19 PM

## 2022-07-30 ENCOUNTER — Other Ambulatory Visit: Payer: Self-pay | Admitting: Family

## 2022-08-01 ENCOUNTER — Encounter: Payer: Medicare Other | Admitting: Physical Therapy

## 2022-08-02 ENCOUNTER — Ambulatory Visit: Payer: Medicare Other | Admitting: Physical Therapy

## 2022-08-02 ENCOUNTER — Encounter: Payer: Self-pay | Admitting: Physical Therapy

## 2022-08-02 DIAGNOSIS — R2681 Unsteadiness on feet: Secondary | ICD-10-CM

## 2022-08-02 DIAGNOSIS — R2689 Other abnormalities of gait and mobility: Secondary | ICD-10-CM

## 2022-08-02 DIAGNOSIS — M6281 Muscle weakness (generalized): Secondary | ICD-10-CM

## 2022-08-02 DIAGNOSIS — R293 Abnormal posture: Secondary | ICD-10-CM

## 2022-08-02 NOTE — Therapy (Signed)
OUTPATIENT PHYSICAL THERAPY TREATMENT    Patient Name: Victor Osborne MRN: [id number] DOB:1932-04-14, 89 y.o., male Today's Date: 08/02/2022   END OF SESSION:  PT End of Session - 08/02/22 1401     Visit Number 15    Date for PT Re-Evaluation 08/23/22    Authorization Type Medicare & [employer]    PT Start Time 1401    PT Stop Time 1443    PT Time Calculation (min) 42 min    Activity Tolerance Patient tolerated treatment well    Behavior During Therapy WFL for tasks assessed/performed                Past Medical History:  Diagnosis Date   Bipolar 1 disorder (HCC)    COPD (chronic obstructive pulmonary disease) (HCC)    Depression    First degree AV block    Hypertension    pt denies but was in medical record   Hypoglycemia, unspecified    Myalgia and myositis, unspecified    Obesity    Pneumonia 1970   Skin cancer (melanoma) (HCC)    Thyroid disease    Unspecified hypothyroidism    UPJ obstruction, congenital    Past Surgical History:  Procedure Laterality Date   APPENDECTOMY  1939   malignant melanoma removed from right forehead  2008   PERMANENT PACEMAKER INSERTION N/A 06/19/2013   Procedure: PERMANENT PACEMAKER INSERTION;  Surgeon: Marinus Maw, MD;  Location: [facility] CATH LAB;  Service: Cardiovascular;  Laterality: N/A;   right big toe  surgery  2009   TRANSURETHRAL RESECTION OF PROSTATE  02/04/2011   Procedure: TRANSURETHRAL RESECTION OF THE PROSTATE (TURP);  Surgeon: Crecencio Mc, MD;  Location: [facility] ORS;  Service: Urology;  Laterality: N/A;   URETHRA SURGERY  2010   reconstruction   Patient Active Problem List   Diagnosis Date Noted   Hyperlipidemia 03/15/2022   TIA (transient ischemic attack) 03/14/2022   History of neoplasm 04/12/2021   Melanocytic nevi of trunk 04/12/2021   Other seborrheic keratosis 04/12/2021   Cerebral embolism with cerebral infarction 03/31/2021   Left leg weakness 03/30/2021   Chronic post-traumatic stress disorder  12/05/2017   Bipolar I disorder, current or most recent episode depressed, in partial remission (HCC) 12/05/2017   CHB (complete heart block) (HCC) 01/27/2017   HTN (hypertension) 01/27/2017   Hypothyroidism 10/11/2016   Parkinson disease 09/08/2016   Chronic osteoarthritis 03/20/2015   Bipolar 1 disorder (HCC) 03/20/2015   Tremor 03/20/2015   Pacemaker 09/24/2013   SSS (sick sinus syndrome) (HCC) 06/19/2013   First degree AV block 04/09/2013   Allergic rhinitis 03/17/2013   Obstructive sleep apnea 12/18/2006   COPD mixed type (HCC) 12/18/2006   History of malignant melanoma of skin 12/18/2006   Diabetes mellitus (HCC) 12/18/2006    PCP: Olive Bass, FNP   REFERRING PROVIDER: Vladimir Faster, DO   REFERRING DIAG:  G45.9 (ICD-10-CM) - TIA (transient ischemic attack)  G20.A1 (ICD-10-CM) - Parkinson's disease without dyskinesia or fluctuating manifestations   THERAPY DIAG:  Other abnormalities of gait and mobility  Unsteadiness on feet  Muscle weakness (generalized)  Abnormal posture  RATIONALE FOR EVALUATION AND TREATMENT: Rehabilitation  ONSET DATE: 03/14/22 - TIA; Initial PD diagnosis >15 yrs ago  NEXT MD VISIT: 10/26/22   SUBJECTIVE:  SUBJECTIVE STATEMENT: Pt reports "everything's going pretty good on my end".  PAIN: Are you having pain? No  PERTINENT HISTORY:  PPM 2 sick sinus syndrome/SA node dysfunction & complete heart block, orthostatic hypotension, COPD, OSA, HTN, DM2, Parkinson's Disease, CVA 03/2021, hypothyroidism, PTSD, bipolar I disorder   PRECAUTIONS: Fall and ICD/Pacemaker  WEIGHT BEARING RESTRICTIONS: No  FALLS:  Has patient fallen in last 6 months? No  LIVING ENVIRONMENT: Lives with: lives alone and with his dog Lives in: Other  Independent living facility (3rd floor) Stairs:  Elevator Has following equipment at home: Single point cane, Environmental consultant - 4 wheeled, and hiking pole  OCCUPATION: Retired  PLOF: Independent and Leisure: being outdoors - walking his dog, and stays active most of the day  PATIENT GOALS: "Improve my hand-eye coordination and especially balance."   OBJECTIVE: (objective measures completed at initial evaluation unless otherwise dated)  DIAGNOSTIC FINDINGS:  03/14/22 & 03/15/22 - CT Head w/o contrast:  IMPRESSION: 1. No evidence of acute intracranial abnormality. 2. Chronic small vessel ischemic disease and cerebral atrophy. 03/14/22 - CT Angio Head & Neck: IMPRESSION: 1. Negative CTA for large vessel occlusion or other emergent finding. 2. Mild for age atheromatous change about the carotid bifurcations and carotid siphons without hemodynamically significant stenosis.  COGNITION: Overall cognitive status: Within functional limits for tasks assessed   SENSATION: WFL Except neuropathy in his feet - most notable at night  COORDINATION: Gross motor WFL B UE/LE  MUSCLE LENGTH: Hamstrings: mod tight B ITB: mod tight L, mild tight R Piriformis: mild tight B Hip flexors: mod tight B Quads: mod tight B Heelcord: mod tight L, mild tight R  POSTURE:  rounded shoulders, forward head, and flexed trunk   LOWER EXTREMITY ROM:    Grossly WFL  LOWER EXTREMITY MMT:    MMT Right Eval Left Eval R 07/14/22 L 07/14/22  Hip flexion 4 4 4+ 4+  Hip extension 4 4 4 4   Hip abduction 4+ 4+ 4+ 4+  Hip adduction 4+ 4+ 4+ 5  Hip internal rotation 4+ 4+ 5 5  Hip external rotation 4 4 4 4   Knee flexion 5 5 5 5   Knee extension 5 5 5 5   Ankle dorsiflexion 4+ 4+ 5 4+  Ankle plantarflexion      Ankle inversion      Ankle eversion      (Blank rows = not tested)  BED MOBILITY:  Sit to supine Complete Independence Supine to sit Complete Independence Rolling to Right Complete  Independence  TRANSFERS: Assistive device utilized: None  Sit to stand: Complete Independence Stand to sit: Complete Independence Chair to chair: Modified independence Floor:  NT  GAIT: Distance walked: 100 ft Assistive device utilized: Environmental consultant - 4 wheeled and None Level of assistance: Modified independence and SBA Gait pattern: step through pattern, decreased arm swing- Right, decreased arm swing- Left, and trunk flexed Comments: more pronounced fwd head, flexed trunk posture noted than on previous PT episodes  RAMP: Level of Assistance:  NT Assistive device utilized:  NT Ramp Comments:   CURB:  Level of Assistance:  NT Assistive device utilized:  NT Curb Comments:   STAIRS:  Level of Assistance: SBA  Stair Negotiation Technique: Alternating Pattern  with Single Rail on Right  Number of Stairs: 14   Height of Stairs: 7"  Comments: Pt ascends stairs with weight mostly through ball of foot on step with heel hanging over edge of step causing slight drop and posterior shift at times, but no  LOB due to grip on railing  FUNCTIONAL TESTS:  5 times sit to stand: 15.96 sec Timed up and go (TUG): Normal = 12.56 sec with rollator, 9.09 sec w/o AD; Manual = 9.32 sec; Cognitive = 14.34 sec 10 meter walk test: 7.97 sec with rollator, 7.59 sec w/o AD; Gait speed = 4.12 ft/sec with rollator, 4.32 ft/sec w/o AD,  Berg Balance Scale: 43/56; 37-45 = significant risk for falls (>80%)  (06/09/22) Functional gait assessment: 17/30; < 19 = high risk fall  06/27/22: 5xSTS = 16.25 sec  06/29/22: 5xSTS = 15 sec  07/12/22:  5xSTS = 13.65 sec with slight UE assist, 12.87 sec w/o UE assist Berg = 51/56; 46-51 moderate risk for falls (>50%)   07/14/22: : 7.03 sec w/o AD Gait speed: 4.67 ft/sec w/o AD FGA: 23/30  Scores as of discharge from last PT episode - December 2023: 5 times sit to stand: 12.13 sec Timed up and go (TUG): Normal = 10.45 sec; Manual = 11.45 sec; Cognitive = 15.15  sec 10 meter walk test: 7.5 sec (no AD); Gait speed = 4.37 ft/sec Berg: 53/56; 52-55 lower (> 25%) fall risk Functional gait assessment: 27/30; 25-28 = low risk fall   PATIENT SURVEYS:  ABC scale 1240 / 1600 = 77.5 %   TODAY'S TREATMENT:              08/02/22 THERAPEUTIC EXERCISE: to improve flexibility, strength and mobility.  Verbal and tactile cues throughout for technique. NuStep - L6 x 6 min (UE/LE to promote improved reciprocal movement patterns)  NEUROMUSCULAR RE-EDUCATION: To improve posture, balance, coordination, reduce fall risk, amplitude of movement, and reduce rigidity. PWR! Step through fwd & back x 10 each - positioned between the backs of 2 chairs but pt only minimally using chairs for support - more difficulty on L,often having to touch down at mid point PWR! Walk 2 x 150 ft PWR! Side-step 4 x 40 ft PWR! High-step x 150 ft PWR! Bounding x 50 ft PWR! Sit to stand x 10   07/28/22 THERAPEUTIC EXERCISE: to improve flexibility, strength and mobility.  Verbal and tactile cues throughout for technique. NuStep - L6 x 6 min (UE/LE to promote improved reciprocal movement patterns) S/L RTB clam 2 x 10 bil S/L hip ABD with RTB just above knees 2 x 10 Bridge + RTB clam 2 x 10 RTB brace marching 2 x 10 Partial squat with RTB hip ABD isometric x 10  NEUROMUSCULAR RE-EDUCATION: To improve posture, balance, proprioception, coordination, reduce fall risk, amplitude of movement, speed of movement to reduce bradykinesia, and reduce rigidity. Retro PWR! Step backwards x 10 each LE - positioned between the backs of 2 chairs but pt only minimally using chairs for support PWR! Step forwards x 10 each LE - positioned between the backs of 2 chairs but pt only minimally using chairs for support Unable to attempt PWR! Step through due to c/o lightheadedness  - VS checked: BP 124/62, HR 60, O2 sats 97-98%   07/26/22 THERAPEUTIC EXERCISE: to improve flexibility, strength and mobility.   Verbal and tactile cues throughout for technique. NuStep - L5 x 6 min (UE/LE to promote improved reciprocal movement patterns) Wall pushups on fists x 10 Serratus pushup on wall x 10  NEUROMUSCULAR RE-EDUCATION: To improve posture, balance, proprioception, coordination, reduce fall risk, amplitude of movement, speed of movement to reduce bradykinesia, and reduce rigidity.  Prone PWR! Moves - Up x 8, Rock, Twist & Step x 10 - modified with pillow  under abdomen/pelvis Quadruped/All 4's PWR! Moves - Up, Rock, Twist & Step x 10 - modified with hands and torso elevated using hands on seat of chair   PATIENT EDUCATION:  Education details:  carryover of PWR! Moves into sit to stand and gait activities   Person educated: Patient Education method: Explanation, Demonstration, and Verbal cues Education comprehension: verbalized understanding, returned demonstration, verbal cues required, and needs further education  HOME EXERCISE PROGRAM: Access Code: [email or web address] URL: [email or web address] Date: 07/26/2022 Prepared by: Glenetta Hew  Exercises - Seated Hamstring Stretch  - 2 x daily - 7 x weekly - 3 reps - 30 sec hold - Seated Figure 4 Piriformis Stretch  - 2 x daily - 7 x weekly - 3 reps - 30 sec hold - Standing Hip Flexor Stretch  - 2 x daily - 7 x weekly - 3 reps - 30 sec hold - Standing ITB Stretch  - 2 x daily - 7 x weekly - 3 reps - 30 sec hold - Marching with Resistance  - 1 x daily - 7 x weekly - 2 sets - 10 reps - 3 sec hold - Standing Hip Extension with Resistance at Ankles and Counter Support  - 1 x daily - 7 x weekly - 2 sets - 10 reps - 3 sec hold - Wall Push Up  - 1 x daily - 7 x weekly - 2 sets - 10 reps - 3 sec hold - Serratus Forearm Push Up Plus at Wall with Elbows  - 1 x daily - 7 x weekly - 2 sets - 10 reps - 3 sec hold  Patient Education - Postural Hypotension - Orthostatic Hypotension  PWR! Moves - Seated - Supine - Standing - Quadruped/All 4's -  Prone  Access Code: [email or web address] from previous episode   ASSESSMENT:  CLINICAL IMPRESSION: Nicandro Perrault" reports he feels like he is moving better, noting increased ease of bed mobility and sit to stand transitions but states he can quite remember the PWR! version of sit to stand - reviewed this and continued to progress PWR! Step patterns including carryover into walking/gait w/o AD. He was able to perform all gait activities w/o need for AD today and states he now only brings his cane with "just in case" and rarely uses his rollator anymore. No issues noted with orthostatic hypotension or lightheadedness today, with pt reporting he had a Gatorade on the way to PT and consumed an 8 ounce bottle of water during the PT session.  Annette Stable will continue to benefit from skilled PT to address his ongoing movement, coordination and balance deficits to improve mobility and activity tolerance with decreased risk for falls.   OBJECTIVE IMPAIRMENTS: Abnormal gait, decreased activity tolerance, decreased balance, decreased coordination, decreased endurance, decreased mobility, difficulty walking, decreased strength, decreased safety awareness, impaired perceived functional ability, impaired flexibility, improper body mechanics, and postural dysfunction.   ACTIVITY LIMITATIONS: carrying, lifting, standing, squatting, stairs, transfers, bed mobility, reach over head, locomotion level, and caring for others  PARTICIPATION LIMITATIONS: meal prep, cleaning, laundry, driving, shopping, and community activity  PERSONAL FACTORS: Age, Fitness, Past/current experiences, Time since onset of injury/illness/exacerbation, and 3+ comorbidities: PPM 2 sick sinus syndrome/SA node dysfunction & complete heart block, orthostatic hypotension, COPD, OSA, HTN, DM2, Parkinson's Disease, CVA 03/2021, hypothyroidism, PTSD, bipolar I disorder   are also affecting patient's functional outcome.   REHAB POTENTIAL: Good  CLINICAL DECISION  MAKING: Evolving/moderate complexity  EVALUATION COMPLEXITY: Moderate   GOALS: Goals reviewed with patient? Yes  SHORT  TERM GOALS: Target date: 06/28/2022  Patient will be independent with initial HEP. Baseline:  Goal status: MET  06/16/22   2.  Patient will improve 5x STS time to </= 14 seconds to demonstrate improved functional strength and transfer efficiency.. Baseline: 15.96 sec Goal status: MET  07/12/22 - 13.65 seconds with UE assist, 12.87 sec w/o UE assist  3.  Patient will be educated on strategies to decrease risk of falls.  Baseline:  Goal status: MET  06/27/22  LONG TERM GOALS: Target date: 07/19/2022, extended to 08/23/2022  Patient will be independent with ongoing/advanced HEP for self-management at home incorporating PWR! Moves as indicated .  Baseline:  Goal status: IN PROGRESS  07/26/22 - HEP updated today  2.  Patient will be able to ambulate at least 800 ft with LRAD including outdoor and unlevel surfaces, independently with no LOB, for improved community gait. Baseline:  Goal status: MET  07/14/22  3.  Patient will be able to step up/down curb safely with LRAD for safety with community ambulation.  Baseline:  Goal status: MET  07/14/22   4.  Patient will demonstrate at least 25/30 on FGA to improve gait stability and reduce risk for falls. (MCID = 4 points) Baseline: 17/30 Goal status: IN PROGRESS  07/14/22 - 23/30  5.  Patient will improve Berg score to >/= 52/56 to improve safety and stability with ADLs in standing and reduce risk for falls. (MCID = 8 points)   Baseline: TBD Goal status: IN PROGRESS  07/12/22 - 51/56  6.  Patient will report >/= 85% on ABC scale to demonstrate improved balance confidence and decreased risk for falls. Baseline: 1240 / 1600 = 77.5 % Goal status: IN PROGRESS  7. Patient will verbalize understanding of local Parkinson's disease community resources, including community fitness post d/c. Baseline:  Goal status: IN  PROGRESS   PLAN:  PT FREQUENCY: 2x/week  PT DURATION: 6 weeks  PLANNED INTERVENTIONS: Therapeutic exercises, Therapeutic activity, Neuromuscular re-education, Balance training, Gait training, Patient/Family education, Self Care, Joint mobilization, Stair training, DME instructions, Manual therapy, and Re-evaluation  PLAN FOR NEXT SESSION: progress postural and lumbopelvic/LE flexibility and strengthening - review/update HEP as needed; progress PWR! Moves; standardized balance reassessment as indicated   Marry Guan, PT 08/02/2022, 5:12 PM

## 2022-08-04 ENCOUNTER — Ambulatory Visit: Payer: Medicare Other | Admitting: Physical Therapy

## 2022-08-04 ENCOUNTER — Encounter: Payer: Self-pay | Admitting: Physical Therapy

## 2022-08-04 DIAGNOSIS — R2689 Other abnormalities of gait and mobility: Secondary | ICD-10-CM | POA: Diagnosis not present

## 2022-08-04 DIAGNOSIS — M6281 Muscle weakness (generalized): Secondary | ICD-10-CM

## 2022-08-04 DIAGNOSIS — R2681 Unsteadiness on feet: Secondary | ICD-10-CM

## 2022-08-04 DIAGNOSIS — R293 Abnormal posture: Secondary | ICD-10-CM

## 2022-08-04 NOTE — Therapy (Signed)
OUTPATIENT PHYSICAL THERAPY TREATMENT    Patient Name: Victor Osborne MRN: [id number] DOB:10/11/1932, 89 y.o., male Today's Date: 08/04/2022   END OF SESSION:  PT End of Session - 08/04/22 1531     Visit Number 16    Date for PT Re-Evaluation 08/23/22    Authorization Type Medicare & [employer]    PT Start Time 1531    PT Stop Time 1616    PT Time Calculation (min) 45 min    Activity Tolerance Patient tolerated treatment well    Behavior During Therapy WFL for tasks assessed/performed                Past Medical History:  Diagnosis Date   Bipolar 1 disorder (HCC)    COPD (chronic obstructive pulmonary disease) (HCC)    Depression    First degree AV block    Hypertension    pt denies but was in medical record   Hypoglycemia, unspecified    Myalgia and myositis, unspecified    Obesity    Pneumonia 1970   Skin cancer (melanoma) (HCC)    Thyroid disease    Unspecified hypothyroidism    UPJ obstruction, congenital    Past Surgical History:  Procedure Laterality Date   APPENDECTOMY  1939   malignant melanoma removed from right forehead  2008   PERMANENT PACEMAKER INSERTION N/A 06/19/2013   Procedure: PERMANENT PACEMAKER INSERTION;  Surgeon: Marinus Maw, MD;  Location: [facility] CATH LAB;  Service: Cardiovascular;  Laterality: N/A;   right big toe  surgery  2009   TRANSURETHRAL RESECTION OF PROSTATE  02/04/2011   Procedure: TRANSURETHRAL RESECTION OF THE PROSTATE (TURP);  Surgeon: Crecencio Mc, MD;  Location: [facility] ORS;  Service: Urology;  Laterality: N/A;   URETHRA SURGERY  2010   reconstruction   Patient Active Problem List   Diagnosis Date Noted   Hyperlipidemia 03/15/2022   TIA (transient ischemic attack) 03/14/2022   History of neoplasm 04/12/2021   Melanocytic nevi of trunk 04/12/2021   Other seborrheic keratosis 04/12/2021   Cerebral embolism with cerebral infarction 03/31/2021   Left leg weakness 03/30/2021   Chronic post-traumatic stress disorder  12/05/2017   Bipolar I disorder, current or most recent episode depressed, in partial remission (HCC) 12/05/2017   CHB (complete heart block) (HCC) 01/27/2017   HTN (hypertension) 01/27/2017   Hypothyroidism 10/11/2016   Parkinson disease 09/08/2016   Chronic osteoarthritis 03/20/2015   Bipolar 1 disorder (HCC) 03/20/2015   Tremor 03/20/2015   Pacemaker 09/24/2013   SSS (sick sinus syndrome) (HCC) 06/19/2013   First degree AV block 04/09/2013   Allergic rhinitis 03/17/2013   Obstructive sleep apnea 12/18/2006   COPD mixed type (HCC) 12/18/2006   History of malignant melanoma of skin 12/18/2006   Diabetes mellitus (HCC) 12/18/2006    PCP: Olive Bass, FNP   REFERRING PROVIDER: Vladimir Faster, DO   REFERRING DIAG:  G45.9 (ICD-10-CM) - TIA (transient ischemic attack)  G20.A1 (ICD-10-CM) - Parkinson's disease without dyskinesia or fluctuating manifestations   THERAPY DIAG:  Other abnormalities of gait and mobility  Unsteadiness on feet  Muscle weakness (generalized)  Abnormal posture  RATIONALE FOR EVALUATION AND TREATMENT: Rehabilitation  ONSET DATE: 03/14/22 - TIA; Initial PD diagnosis >15 yrs ago  NEXT MD VISIT: 10/26/22   SUBJECTIVE:  SUBJECTIVE STATEMENT: Pt states he is feeling more stiff today and was staggering more upon walking into clinic.  PAIN: Are you having pain? No  PERTINENT HISTORY:  PPM 2 sick sinus syndrome/SA node dysfunction & complete heart block, orthostatic hypotension, COPD, OSA, HTN, DM2, Parkinson's Disease, CVA 03/2021, hypothyroidism, PTSD, bipolar I disorder   PRECAUTIONS: Fall and ICD/Pacemaker  WEIGHT BEARING RESTRICTIONS: No  FALLS:  Has patient fallen in last 6 months? No  LIVING ENVIRONMENT: Lives with: lives alone and  with his dog Lives in: Other Independent living facility (3rd floor) Stairs:  Elevator Has following equipment at home: Single point cane, Environmental consultant - 4 wheeled, and hiking pole  OCCUPATION: Retired  PLOF: Independent and Leisure: being outdoors - walking his dog, and stays active most of the day  PATIENT GOALS: "Improve my hand-eye coordination and especially balance."   OBJECTIVE: (objective measures completed at initial evaluation unless otherwise dated)  DIAGNOSTIC FINDINGS:  03/14/22 & 03/15/22 - CT Head w/o contrast:  IMPRESSION: 1. No evidence of acute intracranial abnormality. 2. Chronic small vessel ischemic disease and cerebral atrophy. 03/14/22 - CT Angio Head & Neck: IMPRESSION: 1. Negative CTA for large vessel occlusion or other emergent finding. 2. Mild for age atheromatous change about the carotid bifurcations and carotid siphons without hemodynamically significant stenosis.  COGNITION: Overall cognitive status: Within functional limits for tasks assessed   SENSATION: WFL Except neuropathy in his feet - most notable at night  COORDINATION: Gross motor WFL B UE/LE  MUSCLE LENGTH: Hamstrings: mod tight B ITB: mod tight L, mild tight R Piriformis: mild tight B Hip flexors: mod tight B Quads: mod tight B Heelcord: mod tight L, mild tight R  POSTURE:  rounded shoulders, forward head, and flexed trunk   LOWER EXTREMITY ROM:    Grossly WFL  LOWER EXTREMITY MMT:    MMT Right Eval Left Eval R 07/14/22 L 07/14/22  Hip flexion 4 4 4+ 4+  Hip extension 4 4 4 4   Hip abduction 4+ 4+ 4+ 4+  Hip adduction 4+ 4+ 4+ 5  Hip internal rotation 4+ 4+ 5 5  Hip external rotation 4 4 4 4   Knee flexion 5 5 5 5   Knee extension 5 5 5 5   Ankle dorsiflexion 4+ 4+ 5 4+  Ankle plantarflexion      Ankle inversion      Ankle eversion      (Blank rows = not tested)  BED MOBILITY:  Sit to supine Complete Independence Supine to sit Complete Independence Rolling to Right  Complete Independence  TRANSFERS: Assistive device utilized: None  Sit to stand: Complete Independence Stand to sit: Complete Independence Chair to chair: Modified independence Floor:  NT  GAIT: Distance walked: 100 ft Assistive device utilized: Environmental consultant - 4 wheeled and None Level of assistance: Modified independence and SBA Gait pattern: step through pattern, decreased arm swing- Right, decreased arm swing- Left, and trunk flexed Comments: more pronounced fwd head, flexed trunk posture noted than on previous PT episodes  RAMP: Level of Assistance:  NT Assistive device utilized:  NT Ramp Comments:   CURB:  Level of Assistance:  NT Assistive device utilized:  NT Curb Comments:   STAIRS:  Level of Assistance: SBA  Stair Negotiation Technique: Alternating Pattern  with Single Rail on Right  Number of Stairs: 14   Height of Stairs: 7"  Comments: Pt ascends stairs with weight mostly through ball of foot on step with heel hanging over edge of step causing slight drop  and posterior shift at times, but no LOB due to grip on railing  FUNCTIONAL TESTS:  5 times sit to stand: 15.96 sec Timed up and go (TUG): Normal = 12.56 sec with rollator, 9.09 sec w/o AD; Manual = 9.32 sec; Cognitive = 14.34 sec 10 meter walk test: 7.97 sec with rollator, 7.59 sec w/o AD; Gait speed = 4.12 ft/sec with rollator, 4.32 ft/sec w/o AD,  Berg Balance Scale: 43/56; 37-45 = significant risk for falls (>80%)  (06/09/22) Functional gait assessment: 17/30; < 19 = high risk fall  06/27/22: 5xSTS = 16.25 sec  06/29/22: 5xSTS = 15 sec  07/12/22:  5xSTS = 13.65 sec with slight UE assist, 12.87 sec w/o UE assist Berg = 51/56; 46-51 moderate risk for falls (>50%)   07/14/22: : 7.03 sec w/o AD Gait speed: 4.67 ft/sec w/o AD FGA: 23/30  Scores as of discharge from last PT episode - December 2023: 5 times sit to stand: 12.13 sec Timed up and go (TUG): Normal = 10.45 sec; Manual = 11.45 sec; Cognitive =  15.15 sec 10 meter walk test: 7.5 sec (no AD); Gait speed = 4.37 ft/sec Berg: 53/56; 52-55 lower (> 25%) fall risk Functional gait assessment: 27/30; 25-28 = low risk fall   PATIENT SURVEYS:  ABC scale 1240 / 1600 = 77.5 %   TODAY'S TREATMENT:              08/04/22 THERAPEUTIC EXERCISE: to improve flexibility, strength and mobility.  Verbal and tactile cues throughout for technique. NuStep - L6 x 6 min (UE/LE to promote improved reciprocal movement patterns) S/L RTB clam x 10 bil S/L hip ABD with RTB just above knees x 10 Side plank + RTB clam x 10 bil Hooklying TrA + RTB alt hip ABD/ER x 10 Bridge + RTB hip ABD isometric x 10 Bridge + RTB clam x 10 RTB brace marching 2 x 10 Seated PWR! Up with RTB  scap retraction 2 x 10 Seated RTB scap retraction + horiz ABD diagonal (PT anchoring band) x 10 Seated TrA + double RTB pallof press x 10 bil   08/02/22 THERAPEUTIC EXERCISE: to improve flexibility, strength and mobility.  Verbal and tactile cues throughout for technique. NuStep - L6 x 6 min (UE/LE to promote improved reciprocal movement patterns)  NEUROMUSCULAR RE-EDUCATION: To improve posture, balance, coordination, reduce fall risk, amplitude of movement, and reduce rigidity. PWR! Step through fwd & back x 10 each - positioned between the backs of 2 chairs but pt only minimally using chairs for support - more difficulty on L,often having to touch down at mid point PWR! Walk 2 x 150 ft PWR! Side-step 4 x 40 ft PWR! High-step x 150 ft PWR! Bounding x 50 ft PWR! Sit to stand x 10   07/28/22 THERAPEUTIC EXERCISE: to improve flexibility, strength and mobility.  Verbal and tactile cues throughout for technique. NuStep - L6 x 6 min (UE/LE to promote improved reciprocal movement patterns) S/L RTB clam 2 x 10 bil S/L hip ABD with RTB just above knees 2 x 10 Bridge + RTB clam 2 x 10 RTB brace marching 2 x 10 Partial squat with RTB hip ABD isometric x 10  NEUROMUSCULAR RE-EDUCATION:  To improve posture, balance, proprioception, coordination, reduce fall risk, amplitude of movement, speed of movement to reduce bradykinesia, and reduce rigidity. Retro PWR! Step backwards x 10 each LE - positioned between the backs of 2 chairs but pt only minimally using chairs for support PWR! Step forwards x  10 each LE - positioned between the backs of 2 chairs but pt only minimally using chairs for support Unable to attempt PWR! Step through due to c/o lightheadedness  - VS checked: BP 124/62, HR 60, O2 sats 97-98%   PATIENT EDUCATION:  Education details:  carryover of PWR! Moves into sit to stand and gait activities   Person educated: Patient Education method: Explanation, Demonstration, and Verbal cues Education comprehension: verbalized understanding, returned demonstration, verbal cues required, and needs further education  HOME EXERCISE PROGRAM: Access Code: [email or web address] URL: [email or web address] Date: 07/26/2022 Prepared by: Glenetta Hew  Exercises - Seated Hamstring Stretch  - 2 x daily - 7 x weekly - 3 reps - 30 sec hold - Seated Figure 4 Piriformis Stretch  - 2 x daily - 7 x weekly - 3 reps - 30 sec hold - Standing Hip Flexor Stretch  - 2 x daily - 7 x weekly - 3 reps - 30 sec hold - Standing ITB Stretch  - 2 x daily - 7 x weekly - 3 reps - 30 sec hold - Marching with Resistance  - 1 x daily - 7 x weekly - 2 sets - 10 reps - 3 sec hold - Standing Hip Extension with Resistance at Ankles and Counter Support  - 1 x daily - 7 x weekly - 2 sets - 10 reps - 3 sec hold - Wall Push Up  - 1 x daily - 7 x weekly - 2 sets - 10 reps - 3 sec hold - Serratus Forearm Push Up Plus at Wall with Elbows  - 1 x daily - 7 x weekly - 2 sets - 10 reps - 3 sec hold  Patient Education - Postural Hypotension - Orthostatic Hypotension  PWR! Moves - Seated - Supine - Standing - Quadruped/All 4's - Prone  Access Code: [email or web address] from previous episode   ASSESSMENT:  CLINICAL  IMPRESSION: Victor Osborne" arrived to PT stating he feels "worn out" today but no known reason.  He requested to focus on core and proximal UE/LE strengthening at mat table level rather than work on [employer]! Moves and stepping activities today due to fatigue.  Therapeutic exercises targeting postural and proximal stability addressing areas of LE weakness from most recent MMT as well as shoulder/upper body strength for postural control.  Bill reporting a good workout with no increased pain or problems noted.  Will plan to resume balance and stepping activities in upcoming visits per patient tolerance  OBJECTIVE IMPAIRMENTS: Abnormal gait, decreased activity tolerance, decreased balance, decreased coordination, decreased endurance, decreased mobility, difficulty walking, decreased strength, decreased safety awareness, impaired perceived functional ability, impaired flexibility, improper body mechanics, and postural dysfunction.   ACTIVITY LIMITATIONS: carrying, lifting, standing, squatting, stairs, transfers, bed mobility, reach over head, locomotion level, and caring for others  PARTICIPATION LIMITATIONS: meal prep, cleaning, laundry, driving, shopping, and community activity  PERSONAL FACTORS: Age, Fitness, Past/current experiences, Time since onset of injury/illness/exacerbation, and 3+ comorbidities: PPM 2 sick sinus syndrome/SA node dysfunction & complete heart block, orthostatic hypotension, COPD, OSA, HTN, DM2, Parkinson's Disease, CVA 03/2021, hypothyroidism, PTSD, bipolar I disorder   are also affecting patient's functional outcome.   REHAB POTENTIAL: Good  CLINICAL DECISION MAKING: Evolving/moderate complexity  EVALUATION COMPLEXITY: Moderate   GOALS: Goals reviewed with patient? Yes  SHORT TERM GOALS: Target date: 06/28/2022  Patient will be independent with initial HEP. Baseline:  Goal status: MET  06/16/22   2.  Patient will improve 5x STS time to </= 14 seconds to  demonstrate  improved functional strength and transfer efficiency.. Baseline: 15.96 sec Goal status: MET  07/12/22 - 13.65 seconds with UE assist, 12.87 sec w/o UE assist  3.  Patient will be educated on strategies to decrease risk of falls.  Baseline:  Goal status: MET  06/27/22  LONG TERM GOALS: Target date: 07/19/2022, extended to 08/23/2022  Patient will be independent with ongoing/advanced HEP for self-management at home incorporating PWR! Moves as indicated .  Baseline:  Goal status: IN PROGRESS  07/26/22 - HEP updated today  2.  Patient will be able to ambulate at least 800 ft with LRAD including outdoor and unlevel surfaces, independently with no LOB, for improved community gait. Baseline:  Goal status: MET  07/14/22  3.  Patient will be able to step up/down curb safely with LRAD for safety with community ambulation.  Baseline:  Goal status: MET  07/14/22   4.  Patient will demonstrate at least 25/30 on FGA to improve gait stability and reduce risk for falls. (MCID = 4 points) Baseline: 17/30 Goal status: IN PROGRESS  07/14/22 - 23/30  5.  Patient will improve Berg score to >/= 52/56 to improve safety and stability with ADLs in standing and reduce risk for falls. (MCID = 8 points)   Baseline: TBD Goal status: IN PROGRESS  07/12/22 - 51/56  6.  Patient will report >/= 85% on ABC scale to demonstrate improved balance confidence and decreased risk for falls. Baseline: 1240 / 1600 = 77.5 % Goal status: IN PROGRESS  7. Patient will verbalize understanding of local Parkinson's disease community resources, including community fitness post d/c. Baseline:  Goal status: IN PROGRESS   PLAN:  PT FREQUENCY: 2x/week  PT DURATION: 6 weeks  PLANNED INTERVENTIONS: Therapeutic exercises, Therapeutic activity, Neuromuscular re-education, Balance training, Gait training, Patient/Family education, Self Care, Joint mobilization, Stair training, DME instructions, Manual therapy, and Re-evaluation  PLAN FOR  NEXT SESSION: progress postural and lumbopelvic/LE flexibility and strengthening - review/update HEP as needed; progress PWR! Moves; standardized balance reassessment as indicated   Marry Guan, PT 08/04/2022, 6:16 PM

## 2022-08-09 ENCOUNTER — Ambulatory Visit: Payer: Medicare Other | Admitting: Physical Therapy

## 2022-08-09 ENCOUNTER — Encounter: Payer: Self-pay | Admitting: Physical Therapy

## 2022-08-09 DIAGNOSIS — R2681 Unsteadiness on feet: Secondary | ICD-10-CM

## 2022-08-09 DIAGNOSIS — R2689 Other abnormalities of gait and mobility: Secondary | ICD-10-CM | POA: Diagnosis not present

## 2022-08-09 DIAGNOSIS — R293 Abnormal posture: Secondary | ICD-10-CM

## 2022-08-09 DIAGNOSIS — M6281 Muscle weakness (generalized): Secondary | ICD-10-CM

## 2022-08-09 NOTE — Therapy (Signed)
OUTPATIENT PHYSICAL THERAPY TREATMENT    Patient Name: Victor Osborne MRN: [id number] DOB:[birth year], 89 y.o., male Today's Date: 08/09/2022   END OF SESSION:  PT End of Session - 08/09/22 1446     Visit Number 17    Date for PT Re-Evaluation 08/23/22    Authorization Type Medicare & [employer]    PT Start Time 1446    PT Stop Time 1533    PT Time Calculation (min) 47 min    Activity Tolerance Patient tolerated treatment well    Behavior During Therapy WFL for tasks assessed/performed                Past Medical History:  Diagnosis Date   Bipolar 1 disorder (HCC)    COPD (chronic obstructive pulmonary disease) (HCC)    Depression    First degree AV block    Hypertension    pt denies but was in medical record   Hypoglycemia, unspecified    Myalgia and myositis, unspecified    Obesity    Pneumonia 1970   Skin cancer (melanoma) (HCC)    Thyroid disease    Unspecified hypothyroidism    UPJ obstruction, congenital    Past Surgical History:  Procedure Laterality Date   APPENDECTOMY  1939   malignant melanoma removed from right forehead  2008   PERMANENT PACEMAKER INSERTION N/A 06/19/2013   Procedure: PERMANENT PACEMAKER INSERTION;  Surgeon: Marinus Maw, MD;  Location: [facility] CATH LAB;  Service: Cardiovascular;  Laterality: N/A;   right big toe  surgery  2009   TRANSURETHRAL RESECTION OF PROSTATE  02/04/2011   Procedure: TRANSURETHRAL RESECTION OF THE PROSTATE (TURP);  Surgeon: Crecencio Mc, MD;  Location: [facility] ORS;  Service: Urology;  Laterality: N/A;   URETHRA SURGERY  2010   reconstruction   Patient Active Problem List   Diagnosis Date Noted   Hyperlipidemia 03/15/2022   TIA (transient ischemic attack) 03/14/2022   History of neoplasm 04/12/2021   Melanocytic nevi of trunk 04/12/2021   Other seborrheic keratosis 04/12/2021   Cerebral embolism with cerebral infarction 03/31/2021   Left leg weakness 03/30/2021   Chronic post-traumatic stress disorder  12/05/2017   Bipolar I disorder, current or most recent episode depressed, in partial remission (HCC) 12/05/2017   CHB (complete heart block) (HCC) 01/27/2017   HTN (hypertension) 01/27/2017   Hypothyroidism 10/11/2016   Parkinson disease 09/08/2016   Chronic osteoarthritis 03/20/2015   Bipolar 1 disorder (HCC) 03/20/2015   Tremor 03/20/2015   Pacemaker 09/24/2013   SSS (sick sinus syndrome) (HCC) 06/19/2013   First degree AV block 04/09/2013   Allergic rhinitis 03/17/2013   Obstructive sleep apnea 12/18/2006   COPD mixed type (HCC) 12/18/2006   History of malignant melanoma of skin 12/18/2006   Diabetes mellitus (HCC) 12/18/2006    PCP: Olive Bass, FNP   REFERRING PROVIDER: Vladimir Faster, DO   REFERRING DIAG:  G45.9 (ICD-10-CM) - TIA (transient ischemic attack)  G20.A1 (ICD-10-CM) - Parkinson's disease without dyskinesia or fluctuating manifestations   THERAPY DIAG:  Other abnormalities of gait and mobility  Unsteadiness on feet  Muscle weakness (generalized)  Abnormal posture  RATIONALE FOR EVALUATION AND TREATMENT: Rehabilitation  ONSET DATE: 03/14/22 - TIA; Initial PD diagnosis >15 yrs ago  NEXT MD VISIT: 10/26/22   SUBJECTIVE:  SUBJECTIVE STATEMENT: Pt reports "my balance is better, my getting up and down is a lot better, and my turning over in bed is a lot better". He still uses his rollator in the evening and at night, but using the cane more during the day including when going out, although does tend to rely on a shopping cart when in a store.  PAIN: Are you having pain? No  PERTINENT HISTORY:  PPM 2 sick sinus syndrome/SA node dysfunction & complete heart block, orthostatic hypotension, COPD, OSA, HTN, DM2, Parkinson's Disease, CVA 03/2021,  hypothyroidism, PTSD, bipolar I disorder   PRECAUTIONS: Fall and ICD/Pacemaker  WEIGHT BEARING RESTRICTIONS: No  FALLS:  Has patient fallen in last 6 months? No  LIVING ENVIRONMENT: Lives with: lives alone and with his dog Lives in: Other Independent living facility (3rd floor) Stairs:  Elevator Has following equipment at home: Single point cane, Environmental consultant - 4 wheeled, and hiking pole  OCCUPATION: Retired  PLOF: Independent and Leisure: being outdoors - walking his dog, and stays active most of the day  PATIENT GOALS: "Improve my hand-eye coordination and especially balance."   OBJECTIVE: (objective measures completed at initial evaluation unless otherwise dated)  DIAGNOSTIC FINDINGS:  03/14/22 & 03/15/22 - CT Head w/o contrast:  IMPRESSION: 1. No evidence of acute intracranial abnormality. 2. Chronic small vessel ischemic disease and cerebral atrophy. 03/14/22 - CT Angio Head & Neck: IMPRESSION: 1. Negative CTA for large vessel occlusion or other emergent finding. 2. Mild for age atheromatous change about the carotid bifurcations and carotid siphons without hemodynamically significant stenosis.  COGNITION: Overall cognitive status: Within functional limits for tasks assessed   SENSATION: WFL Except neuropathy in his feet - most notable at night  COORDINATION: Gross motor WFL B UE/LE  MUSCLE LENGTH: Hamstrings: mod tight B ITB: mod tight L, mild tight R Piriformis: mild tight B Hip flexors: mod tight B Quads: mod tight B Heelcord: mod tight L, mild tight R  POSTURE:  rounded shoulders, forward head, and flexed trunk   LOWER EXTREMITY ROM:    Grossly WFL  LOWER EXTREMITY MMT:    MMT Right Eval Left Eval R 07/14/22 L 07/14/22  Hip flexion 4 4 4+ 4+  Hip extension 4 4 4 4   Hip abduction 4+ 4+ 4+ 4+  Hip adduction 4+ 4+ 4+ 5  Hip internal rotation 4+ 4+ 5 5  Hip external rotation 4 4 4 4   Knee flexion 5 5 5 5   Knee extension 5 5 5 5   Ankle dorsiflexion 4+ 4+  5 4+  Ankle plantarflexion      Ankle inversion      Ankle eversion      (Blank rows = not tested)  BED MOBILITY:  Sit to supine Complete Independence Supine to sit Complete Independence Rolling to Right Complete Independence  TRANSFERS: Assistive device utilized: None  Sit to stand: Complete Independence Stand to sit: Complete Independence Chair to chair: Modified independence Floor:  NT  GAIT: Distance walked: 100 ft Assistive device utilized: Environmental consultant - 4 wheeled and None Level of assistance: Modified independence and SBA Gait pattern: step through pattern, decreased arm swing- Right, decreased arm swing- Left, and trunk flexed Comments: more pronounced fwd head, flexed trunk posture noted than on previous PT episodes  RAMP: Level of Assistance:  NT Assistive device utilized:  NT Ramp Comments:   CURB:  Level of Assistance:  NT Assistive device utilized:  NT Curb Comments:   STAIRS:  Level of Assistance: SBA  Stair  Negotiation Technique: Alternating Pattern  with Single Rail on Right  Number of Stairs: 14   Height of Stairs: 7"  Comments: Pt ascends stairs with weight mostly through ball of foot on step with heel hanging over edge of step causing slight drop and posterior shift at times, but no LOB due to grip on railing  FUNCTIONAL TESTS:  5 times sit to stand: 15.96 sec Timed up and go (TUG): Normal = 12.56 sec with rollator, 9.09 sec w/o AD; Manual = 9.32 sec; Cognitive = 14.34 sec 10 meter walk test: 7.97 sec with rollator, 7.59 sec w/o AD; Gait speed = 4.12 ft/sec with rollator, 4.32 ft/sec w/o AD,  Berg Balance Scale: 43/56; 37-45 = significant risk for falls (>80%)  (06/09/22) Functional gait assessment: 17/30; < 19 = high risk fall  06/27/22: 5xSTS = 16.25 sec  06/29/22: 5xSTS = 15 sec  07/12/22:  5xSTS = 13.65 sec with slight UE assist, 12.87 sec w/o UE assist Berg = 51/56; 46-51 moderate risk for falls (>50%)   07/14/22: : 7.03 sec w/o AD Gait  speed: 4.67 ft/sec w/o AD FGA: 23/30  Scores as of discharge from last PT episode - December 2023: 5 times sit to stand: 12.13 sec Timed up and go (TUG): Normal = 10.45 sec; Manual = 11.45 sec; Cognitive = 15.15 sec 10 meter walk test: 7.5 sec (no AD); Gait speed = 4.37 ft/sec Berg: 53/56; 52-55 lower (> 25%) fall risk Functional gait assessment: 27/30; 25-28 = low risk fall   PATIENT SURVEYS:  ABC scale 1240 / 1600 = 77.5 %   TODAY'S TREATMENT:              08/09/22 THERAPEUTIC EXERCISE: to improve flexibility, strength and mobility.  Verbal and tactile cues throughout for technique. NuStep - L6 x 6 min (UE/LE to promote improved reciprocal movement patterns) TRX squat 2 x 10, 2nd set with triple extension heel raise on return to standing TRX split squat x 10 with each foot fwd Lateral step over 1/2 FR with TRX support x 10 TRX low row x 10 - cues for abdominal and glute muscle engagement to avoid dropping hips back Wall pushups on green Pball x 10  NEUROMUSCULAR RE-EDUCATION: To improve balance, proprioception, coordination, reduce fall risk, amplitude of movement, speed of movement to reduce bradykinesia, and reduce rigidity. PWR! 90 Pivot turns between 2 colored dots x 10 each side   08/04/22 THERAPEUTIC EXERCISE: to improve flexibility, strength and mobility.  Verbal and tactile cues throughout for technique. NuStep - L6 x 6 min (UE/LE to promote improved reciprocal movement patterns) S/L RTB clam x 10 bil S/L hip ABD with RTB just above knees x 10 Side plank + RTB clam x 10 bil Hooklying TrA + RTB alt hip ABD/ER x 10 Bridge + RTB hip ABD isometric x 10 Bridge + RTB clam x 10 RTB brace marching 2 x 10 Seated PWR! Up with RTB  scap retraction 2 x 10 Seated RTB scap retraction + horiz ABD diagonal (PT anchoring band) x 10 Seated TrA + double RTB pallof press x 10 bil   08/02/22 THERAPEUTIC EXERCISE: to improve flexibility, strength and mobility.  Verbal and tactile cues  throughout for technique. NuStep - L6 x 6 min (UE/LE to promote improved reciprocal movement patterns)  NEUROMUSCULAR RE-EDUCATION: To improve posture, balance, coordination, reduce fall risk, amplitude of movement, and reduce rigidity. PWR! Step through fwd & back x 10 each - positioned between the backs of 2 chairs  but pt only minimally using chairs for support - more difficulty on L,often having to touch down at mid point PWR! Walk 2 x 150 ft PWR! Side-step 4 x 40 ft PWR! High-step x 150 ft PWR! Bounding x 50 ft PWR! Sit to stand x 10   PATIENT EDUCATION:  Education details:  carryover of PWR! Moves into sit to stand and gait activities   Person educated: Patient Education method: Explanation, Demonstration, and Verbal cues Education comprehension: verbalized understanding, returned demonstration, verbal cues required, and needs further education  HOME EXERCISE PROGRAM: Access Code: [email or web address] URL: [email or web address] Date: 07/26/2022 Prepared by: Glenetta Hew  Exercises - Seated Hamstring Stretch  - 2 x daily - 7 x weekly - 3 reps - 30 sec hold - Seated Figure 4 Piriformis Stretch  - 2 x daily - 7 x weekly - 3 reps - 30 sec hold - Standing Hip Flexor Stretch  - 2 x daily - 7 x weekly - 3 reps - 30 sec hold - Standing ITB Stretch  - 2 x daily - 7 x weekly - 3 reps - 30 sec hold - Marching with Resistance  - 1 x daily - 7 x weekly - 2 sets - 10 reps - 3 sec hold - Standing Hip Extension with Resistance at Ankles and Counter Support  - 1 x daily - 7 x weekly - 2 sets - 10 reps - 3 sec hold - Wall Push Up  - 1 x daily - 7 x weekly - 2 sets - 10 reps - 3 sec hold - Serratus Forearm Push Up Plus at Wall with Elbows  - 1 x daily - 7 x weekly - 2 sets - 10 reps - 3 sec hold  Patient Education - Postural Hypotension - Orthostatic Hypotension  PWR! Moves - Seated - Supine - Standing - Quadruped/All 4's - Prone  Access Code: [email or web address] from previous  episode   ASSESSMENT:  CLINICAL IMPRESSION: Victor Osborne" reports he is feeling better today and notes improving everyday mobility but still wants to work on strengthening. TRX used for dynamic support with UE and LE strengthening as well as stepping activities for balance.  He required intermittent seated rest breaks due to fatigue and one episode of lightheadedness (resolved with rest and a bottle of water), but otherwise well tolerated.  Victor Osborne will benefit from continued skilled PT to address ongoing strength and balance deficits to improve mobility and activity tolerance with decreased risk for falls.   OBJECTIVE IMPAIRMENTS: Abnormal gait, decreased activity tolerance, decreased balance, decreased coordination, decreased endurance, decreased mobility, difficulty walking, decreased strength, decreased safety awareness, impaired perceived functional ability, impaired flexibility, improper body mechanics, and postural dysfunction.   ACTIVITY LIMITATIONS: carrying, lifting, standing, squatting, stairs, transfers, bed mobility, reach over head, locomotion level, and caring for others  PARTICIPATION LIMITATIONS: meal prep, cleaning, laundry, driving, shopping, and community activity  PERSONAL FACTORS: Age, Fitness, Past/current experiences, Time since onset of injury/illness/exacerbation, and 3+ comorbidities: PPM 2 sick sinus syndrome/SA node dysfunction & complete heart block, orthostatic hypotension, COPD, OSA, HTN, DM2, Parkinson's Disease, CVA 03/2021, hypothyroidism, PTSD, bipolar I disorder   are also affecting patient's functional outcome.   REHAB POTENTIAL: Good  CLINICAL DECISION MAKING: Evolving/moderate complexity  EVALUATION COMPLEXITY: Moderate   GOALS: Goals reviewed with patient? Yes  SHORT TERM GOALS: Target date: 06/28/2022  Patient will be independent with initial HEP. Baseline:  Goal status: MET  06/16/22   2.  Patient will improve 5x STS time to </= 14  seconds to  demonstrate improved functional strength and transfer efficiency.. Baseline: 15.96 sec Goal status: MET  07/12/22 - 13.65 seconds with UE assist, 12.87 sec w/o UE assist  3.  Patient will be educated on strategies to decrease risk of falls.  Baseline:  Goal status: MET  06/27/22  LONG TERM GOALS: Target date: 07/19/2022, extended to 08/23/2022  Patient will be independent with ongoing/advanced HEP for self-management at home incorporating PWR! Moves as indicated .  Baseline:  Goal status: IN PROGRESS  07/26/22 - HEP updated today  2.  Patient will be able to ambulate at least 800 ft with LRAD including outdoor and unlevel surfaces, independently with no LOB, for improved community gait. Baseline:  Goal status: MET  07/14/22  3.  Patient will be able to step up/down curb safely with LRAD for safety with community ambulation.  Baseline:  Goal status: MET  07/14/22   4.  Patient will demonstrate at least 25/30 on FGA to improve gait stability and reduce risk for falls. (MCID = 4 points) Baseline: 17/30 Goal status: IN PROGRESS  07/14/22 - 23/30  5.  Patient will improve Berg score to >/= 52/56 to improve safety and stability with ADLs in standing and reduce risk for falls. (MCID = 8 points)   Baseline: TBD Goal status: IN PROGRESS  07/12/22 - 51/56  6.  Patient will report >/= 85% on ABC scale to demonstrate improved balance confidence and decreased risk for falls. Baseline: 1240 / 1600 = 77.5 % Goal status: IN PROGRESS  7. Patient will verbalize understanding of local Parkinson's disease community resources, including community fitness post d/c. Baseline:  Goal status: IN PROGRESS   PLAN:  PT FREQUENCY: 2x/week  PT DURATION: 6 weeks  PLANNED INTERVENTIONS: Therapeutic exercises, Therapeutic activity, Neuromuscular re-education, Balance training, Gait training, Patient/Family education, Self Care, Joint mobilization, Stair training, DME instructions, Manual therapy, and  Re-evaluation  PLAN FOR NEXT SESSION: progress postural and lumbopelvic/LE flexibility and strengthening - review/update HEP as needed; progress PWR! Moves; standardized balance reassessment as indicated; LTG assessment   Marry Guan, PT 08/09/2022, 5:28 PM

## 2022-08-12 ENCOUNTER — Ambulatory Visit: Payer: Medicare Other | Admitting: Physical Therapy

## 2022-08-12 ENCOUNTER — Encounter: Payer: Self-pay | Admitting: Physical Therapy

## 2022-08-12 DIAGNOSIS — R2681 Unsteadiness on feet: Secondary | ICD-10-CM

## 2022-08-12 DIAGNOSIS — M6281 Muscle weakness (generalized): Secondary | ICD-10-CM

## 2022-08-12 DIAGNOSIS — R2689 Other abnormalities of gait and mobility: Secondary | ICD-10-CM

## 2022-08-12 DIAGNOSIS — R293 Abnormal posture: Secondary | ICD-10-CM

## 2022-08-12 NOTE — Therapy (Signed)
OUTPATIENT PHYSICAL THERAPY TREATMENT    Patient Name: Victor Osborne MRN: [id number] DOB:Aug 29, 1932, 89 y.o., male Today's Date: 08/12/2022   END OF SESSION:  PT End of Session - 08/12/22 1059     Visit Number 18    Date for PT Re-Evaluation 08/23/22    Authorization Type Medicare & [employer]    PT Start Time 1059    PT Stop Time 1144    PT Time Calculation (min) 45 min    Activity Tolerance Patient tolerated treatment well    Behavior During Therapy WFL for tasks assessed/performed                Past Medical History:  Diagnosis Date   Bipolar 1 disorder (HCC)    COPD (chronic obstructive pulmonary disease) (HCC)    Depression    First degree AV block    Hypertension    pt denies but was in medical record   Hypoglycemia, unspecified    Myalgia and myositis, unspecified    Obesity    Pneumonia 1970   Skin cancer (melanoma) (HCC)    Thyroid disease    Unspecified hypothyroidism    UPJ obstruction, congenital    Past Surgical History:  Procedure Laterality Date   APPENDECTOMY  1939   malignant melanoma removed from right forehead  2008   PERMANENT PACEMAKER INSERTION N/A 06/19/2013   Procedure: PERMANENT PACEMAKER INSERTION;  Surgeon: Marinus Maw, MD;  Location: [facility] CATH LAB;  Service: Cardiovascular;  Laterality: N/A;   right big toe  surgery  2009   TRANSURETHRAL RESECTION OF PROSTATE  02/04/2011   Procedure: TRANSURETHRAL RESECTION OF THE PROSTATE (TURP);  Surgeon: Crecencio Mc, MD;  Location: [facility] ORS;  Service: Urology;  Laterality: N/A;   URETHRA SURGERY  2010   reconstruction   Patient Active Problem List   Diagnosis Date Noted   Hyperlipidemia 03/15/2022   TIA (transient ischemic attack) 03/14/2022   History of neoplasm 04/12/2021   Melanocytic nevi of trunk 04/12/2021   Other seborrheic keratosis 04/12/2021   Cerebral embolism with cerebral infarction 03/31/2021   Left leg weakness 03/30/2021   Chronic post-traumatic stress disorder  12/05/2017   Bipolar I disorder, current or most recent episode depressed, in partial remission (HCC) 12/05/2017   CHB (complete heart block) (HCC) 01/27/2017   HTN (hypertension) 01/27/2017   Hypothyroidism 10/11/2016   Parkinson disease 09/08/2016   Chronic osteoarthritis 03/20/2015   Bipolar 1 disorder (HCC) 03/20/2015   Tremor 03/20/2015   Pacemaker 09/24/2013   SSS (sick sinus syndrome) (HCC) 06/19/2013   First degree AV block 04/09/2013   Allergic rhinitis 03/17/2013   Obstructive sleep apnea 12/18/2006   COPD mixed type (HCC) 12/18/2006   History of malignant melanoma of skin 12/18/2006   Diabetes mellitus (HCC) 12/18/2006    PCP: Olive Bass, FNP   REFERRING PROVIDER: Vladimir Faster, DO   REFERRING DIAG:  G45.9 (ICD-10-CM) - TIA (transient ischemic attack)  G20.A1 (ICD-10-CM) - Parkinson's disease without dyskinesia or fluctuating manifestations   THERAPY DIAG:  Other abnormalities of gait and mobility  Unsteadiness on feet  Muscle weakness (generalized)  Abnormal posture  RATIONALE FOR EVALUATION AND TREATMENT: Rehabilitation  ONSET DATE: 03/14/22 - TIA; Initial PD diagnosis >15 yrs ago  NEXT MD VISIT: 10/26/22   SUBJECTIVE:  SUBJECTIVE STATEMENT: Pt reports "everything is running along a lot better these days".  PAIN: Are you having pain? No  PERTINENT HISTORY:  PPM 2 sick sinus syndrome/SA node dysfunction & complete heart block, orthostatic hypotension, COPD, OSA, HTN, DM2, Parkinson's Disease, CVA 03/2021, hypothyroidism, PTSD, bipolar I disorder   PRECAUTIONS: Fall and ICD/Pacemaker  WEIGHT BEARING RESTRICTIONS: No  FALLS:  Has patient fallen in last 6 months? No  LIVING ENVIRONMENT: Lives with: lives alone and with his dog Lives in:  Other Independent living facility (3rd floor) Stairs:  Elevator Has following equipment at home: Single point cane, Environmental consultant - 4 wheeled, and hiking pole  OCCUPATION: Retired  PLOF: Independent and Leisure: being outdoors - walking his dog, and stays active most of the day  PATIENT GOALS: "Improve my hand-eye coordination and especially balance."   OBJECTIVE: (objective measures completed at initial evaluation unless otherwise dated)  DIAGNOSTIC FINDINGS:  03/14/22 & 03/15/22 - CT Head w/o contrast:  IMPRESSION: 1. No evidence of acute intracranial abnormality. 2. Chronic small vessel ischemic disease and cerebral atrophy. 03/14/22 - CT Angio Head & Neck: IMPRESSION: 1. Negative CTA for large vessel occlusion or other emergent finding. 2. Mild for age atheromatous change about the carotid bifurcations and carotid siphons without hemodynamically significant stenosis.  COGNITION: Overall cognitive status: Within functional limits for tasks assessed   SENSATION: WFL Except neuropathy in his feet - most notable at night  COORDINATION: Gross motor WFL B UE/LE  MUSCLE LENGTH: Hamstrings: mod tight B ITB: mod tight L, mild tight R Piriformis: mild tight B Hip flexors: mod tight B Quads: mod tight B Heelcord: mod tight L, mild tight R  POSTURE:  rounded shoulders, forward head, and flexed trunk   LOWER EXTREMITY ROM:    Grossly WFL  LOWER EXTREMITY MMT:    MMT Right Eval Left Eval R 07/14/22 L 07/14/22  Hip flexion 4 4 4+ 4+  Hip extension 4 4 4 4   Hip abduction 4+ 4+ 4+ 4+  Hip adduction 4+ 4+ 4+ 5  Hip internal rotation 4+ 4+ 5 5  Hip external rotation 4 4 4 4   Knee flexion 5 5 5 5   Knee extension 5 5 5 5   Ankle dorsiflexion 4+ 4+ 5 4+  Ankle plantarflexion      Ankle inversion      Ankle eversion      (Blank rows = not tested)  BED MOBILITY:  Sit to supine Complete Independence Supine to sit Complete Independence Rolling to Right Complete  Independence  TRANSFERS: Assistive device utilized: None  Sit to stand: Complete Independence Stand to sit: Complete Independence Chair to chair: Modified independence Floor:  NT  GAIT: Distance walked: 100 ft Assistive device utilized: Environmental consultant - 4 wheeled and None Level of assistance: Modified independence and SBA Gait pattern: step through pattern, decreased arm swing- Right, decreased arm swing- Left, and trunk flexed Comments: more pronounced fwd head, flexed trunk posture noted than on previous PT episodes  RAMP: Level of Assistance:  NT Assistive device utilized:  NT Ramp Comments:   CURB:  Level of Assistance:  NT Assistive device utilized:  NT Curb Comments:   STAIRS:  Level of Assistance: SBA  Stair Negotiation Technique: Alternating Pattern  with Single Rail on Right  Number of Stairs: 14   Height of Stairs: 7"  Comments: Pt ascends stairs with weight mostly through ball of foot on step with heel hanging over edge of step causing slight drop and posterior shift at times,  but no LOB due to grip on railing  FUNCTIONAL TESTS:  5 times sit to stand: 15.96 sec Timed up and go (TUG): Normal = 12.56 sec with rollator, 9.09 sec w/o AD; Manual = 9.32 sec; Cognitive = 14.34 sec 10 meter walk test: 7.97 sec with rollator, 7.59 sec w/o AD; Gait speed = 4.12 ft/sec with rollator, 4.32 ft/sec w/o AD,  Berg Balance Scale: 43/56; 37-45 = significant risk for falls (>80%)  (06/09/22) Functional gait assessment: 17/30; < 19 = high risk fall  06/27/22: 5xSTS = 16.25 sec  06/29/22: 5xSTS = 15 sec  07/12/22:  5xSTS = 13.65 sec with slight UE assist, 12.87 sec w/o UE assist Berg = 51/56; 46-51 moderate risk for falls (>50%)   07/14/22: : 7.03 sec w/o AD Gait speed: 4.67 ft/sec w/o AD FGA: 23/30  Scores as of discharge from last PT episode - December 2023: 5 times sit to stand: 12.13 sec Timed up and go (TUG): Normal = 10.45 sec; Manual = 11.45 sec; Cognitive = 15.15  sec 10 meter walk test: 7.5 sec (no AD); Gait speed = 4.37 ft/sec Berg: 53/56; 52-55 lower (> 25%) fall risk Functional gait assessment: 27/30; 25-28 = low risk fall   PATIENT SURVEYS:  ABC scale 1240 / 1600 = 77.5 %   TODAY'S TREATMENT:              08/12/22 THERAPEUTIC EXERCISE: to improve flexibility, strength and mobility.  Verbal and tactile cues throughout for technique. NuStep - L6 x 6 min (UE/LE to promote improved reciprocal movement patterns) Initiated HEP review - pt denying need for further practice of nay exercises or progression of resistance bands  SELF CARE:  Reviewed information in monthly [employer] Parkinson's emails  and provided handout on community focused exercise programs for pt's with PD or movement disorders   THERAPEUTIC ACTIVITIES: 5xSTS = 14.06 sec w/o UE assist TUG: Normal = 8.34 sec w/o AD; Manual = 9.16 sec; Cognitive = 14.06 sec : 6.44 sec w/o AD Gait speed: 5.09 ft/sec w/o AD Berg = 52/56; 52-55 lower fall risk (> 25%)  NEUROMUSCULAR RE-EDUCATION: To improve balance, proprioception, coordination, reduce fall risk, amplitude of movement, speed of movement to reduce bradykinesia, and reduce rigidity. PWR! 90 Pivot turns initially between 2 colored dots working toward 1/4 turn without need for visual targets 2 x 10 each side, cues to avoid looking down at feet   08/09/22 THERAPEUTIC EXERCISE: to improve flexibility, strength and mobility.  Verbal and tactile cues throughout for technique. NuStep - L6 x 6 min (UE/LE to promote improved reciprocal movement patterns) TRX squat 2 x 10, 2nd set with triple extension heel raise on return to standing TRX split squat x 10 with each foot fwd Lateral step over 1/2 FR with TRX support x 10 TRX low row x 10 - cues for abdominal and glute muscle engagement to avoid dropping hips back Wall pushups on green Pball x 10  NEUROMUSCULAR RE-EDUCATION: To improve balance, proprioception, coordination, reduce fall  risk, amplitude of movement, speed of movement to reduce bradykinesia, and reduce rigidity. PWR! 90 Pivot turns between 2 colored dots x 10 each side   08/04/22 THERAPEUTIC EXERCISE: to improve flexibility, strength and mobility.  Verbal and tactile cues throughout for technique. NuStep - L6 x 6 min (UE/LE to promote improved reciprocal movement patterns) S/L RTB clam x 10 bil S/L hip ABD with RTB just above knees x 10 Side plank + RTB clam x 10 bil Hooklying TrA +  RTB alt hip ABD/ER x 10 Bridge + RTB hip ABD isometric x 10 Bridge + RTB clam x 10 RTB brace marching 2 x 10 Seated PWR! Up with RTB  scap retraction 2 x 10 Seated RTB scap retraction + horiz ABD diagonal (PT anchoring band) x 10 Seated TrA + double RTB pallof press x 10 bil   PATIENT EDUCATION:  Education details:  Reviewed information in monthly [employer] and provided handout on community focused exercise programs for pt's with PD or movement disorders to prevent loss of gains achieved with PT and slow progression of PD related deficits    Person educated: Patient Education method: Chief Technology Officer Education comprehension: verbalized understanding  HOME EXERCISE PROGRAM: Access Code: [email or web address] URL: [email or web address] Date: 07/26/2022 Prepared by: Glenetta Hew  Exercises - Seated Hamstring Stretch  - 2 x daily - 7 x weekly - 3 reps - 30 sec hold - Seated Figure 4 Piriformis Stretch  - 2 x daily - 7 x weekly - 3 reps - 30 sec hold - Standing Hip Flexor Stretch  - 2 x daily - 7 x weekly - 3 reps - 30 sec hold - Standing ITB Stretch  - 2 x daily - 7 x weekly - 3 reps - 30 sec hold - Marching with Resistance  - 1 x daily - 7 x weekly - 2 sets - 10 reps - 3 sec hold - Standing Hip Extension with Resistance at Ankles and Counter Support  - 1 x daily - 7 x weekly - 2 sets - 10 reps - 3 sec hold - Wall Push Up  - 1 x daily - 7 x weekly - 2 sets - 10 reps - 3 sec hold - Serratus  Forearm Push Up Plus at Wall with Elbows  - 1 x daily - 7 x weekly - 2 sets - 10 reps - 3 sec hold  Patient Education - Postural Hypotension - Orthostatic Hypotension  PWR! Moves - Seated - Supine - Standing - Quadruped/All 4's - Prone  Access Code: [email or web address] from previous episode   ASSESSMENT:  CLINICAL IMPRESSION: Petra Kuba" reports he feels like the inhaler they recently started him on is giving him side-effects of tremors - encouraged him to bring this to his MD's attention if this is concerning him.  Today's session focusing on preparation for transition to HEP at end of current POC.  We reviewed the information in monthly [employer] Parkinson's emails and provided handout on community focused exercise programs for pt's with PD or movement disorders to prevent loss of gains achieved with PT and slow progression of PD related deficits.  He reports no concerns with HEP and denies need for advancement of Thera-Band resistance with strengthening exercises.  He reports good comfort with basic PWR! Moves, although admits he has been focusing more on prone supine in quadruped and recent performance and has not recently attempted sitting or standing PWR! Moves.  We reviewed more advanced PWR! Step in patterns, practicing quarter turns working at keeping his head more upright rather than looking at his feet.  He plans to bring his HEP handouts with him to next visit to make sure he is not missing any of the handouts.  Initiated standardized balance testing with 5xSTS, TUG, & Berg, with all testing thus far indicating low fall risk.  Will plan to complete FGA and ABC scale next visit and address any remaining concerns with HEP or transition to home program.  OBJECTIVE IMPAIRMENTS: Abnormal gait, decreased activity tolerance, decreased balance, decreased coordination, decreased endurance, decreased mobility, difficulty walking, decreased strength, decreased safety awareness, impaired  perceived functional ability, impaired flexibility, improper body mechanics, and postural dysfunction.   ACTIVITY LIMITATIONS: carrying, lifting, standing, squatting, stairs, transfers, bed mobility, reach over head, locomotion level, and caring for others  PARTICIPATION LIMITATIONS: meal prep, cleaning, laundry, driving, shopping, and community activity  PERSONAL FACTORS: Age, Fitness, Past/current experiences, Time since onset of injury/illness/exacerbation, and 3+ comorbidities: PPM 2 sick sinus syndrome/SA node dysfunction & complete heart block, orthostatic hypotension, COPD, OSA, HTN, DM2, Parkinson's Disease, CVA 03/2021, hypothyroidism, PTSD, bipolar I disorder   are also affecting patient's functional outcome.   REHAB POTENTIAL: Good  CLINICAL DECISION MAKING: Evolving/moderate complexity  EVALUATION COMPLEXITY: Moderate   GOALS: Goals reviewed with patient? Yes  SHORT TERM GOALS: Target date: 06/28/2022  Patient will be independent with initial HEP. Baseline:  Goal status: MET  06/16/22   2.  Patient will improve 5x STS time to </= 14 seconds to demonstrate improved functional strength and transfer efficiency.. Baseline: 15.96 sec Goal status: MET  07/12/22 - 13.65 seconds with UE assist, 12.87 sec w/o UE assist  3.  Patient will be educated on strategies to decrease risk of falls.  Baseline:  Goal status: MET  06/27/22  LONG TERM GOALS: Target date: 07/19/2022, extended to 08/23/2022  Patient will be independent with ongoing/advanced HEP for self-management at home incorporating PWR! Moves as indicated .  Baseline:  Goal status: IN PROGRESS  08/12/22 - HEP reviewed today  2.  Patient will be able to ambulate at least 800 ft with LRAD including outdoor and unlevel surfaces, independently with no LOB, for improved community gait. Baseline:  Goal status: MET  07/14/22  3.  Patient will be able to step up/down curb safely with LRAD for safety with community ambulation.   Baseline:  Goal status: MET  07/14/22   4.  Patient will demonstrate at least 25/30 on FGA to improve gait stability and reduce risk for falls. (MCID = 4 points) Baseline: 17/30 Goal status: IN PROGRESS  07/14/22 - 23/30  5.  Patient will improve Berg score to >/= 52/56 to improve safety and stability with ADLs in standing and reduce risk for falls. (MCID = 8 points)   Baseline: 43/56 Goal status: MET  08/12/22 - 52/56  6.  Patient will report >/= 85% on ABC scale to demonstrate improved balance confidence and decreased risk for falls. Baseline: 1240 / 1600 = 77.5 % Goal status: IN PROGRESS  7. Patient will verbalize understanding of local Parkinson's disease community resources, including community fitness post d/c. Baseline:  Goal status: MET  08/12/22 - Pt receives monthly [employer] - reviewed community focused exercise programs for pt's with PD or movement disorders with handout provided   PLAN:  PT FREQUENCY: 2x/week  PT DURATION: 6 weeks  PLANNED INTERVENTIONS: Therapeutic exercises, Therapeutic activity, Neuromuscular re-education, Balance training, Gait training, Patient/Family education, Self Care, Joint mobilization, Stair training, DME instructions, Manual therapy, and Re-evaluation  PLAN FOR NEXT SESSION: ABC scale & FGA; complete goal assessment; anticipate transition to HEP with D/C from PT   Marry Guan, PT 08/12/2022, 12:10 PM

## 2022-08-23 ENCOUNTER — Ambulatory Visit: Payer: Medicare Other | Admitting: Physical Therapy

## 2022-09-02 ENCOUNTER — Encounter: Payer: Self-pay | Admitting: Physician Assistant

## 2022-09-02 ENCOUNTER — Ambulatory Visit: Payer: Medicare Other | Admitting: Physician Assistant

## 2022-09-02 VITALS — BP 110/62 | HR 61 | Ht 76.0 in | Wt 191.0 lb

## 2022-09-02 DIAGNOSIS — I951 Orthostatic hypotension: Secondary | ICD-10-CM | POA: Diagnosis not present

## 2022-09-02 DIAGNOSIS — I10 Essential (primary) hypertension: Secondary | ICD-10-CM | POA: Diagnosis not present

## 2022-09-02 DIAGNOSIS — I495 Sick sinus syndrome: Secondary | ICD-10-CM | POA: Diagnosis not present

## 2022-09-02 DIAGNOSIS — Z950 Presence of cardiac pacemaker: Secondary | ICD-10-CM | POA: Insufficient documentation

## 2022-09-02 NOTE — Patient Instructions (Signed)
Medication Instructions:   Your physician recommends that you continue on your current medications as directed. Please refer to the Current Medication list given to you today.   *If you need a refill on your cardiac medications before your next appointment, please call your pharmacy*   Lab Work:  RETURN  IN 2 WEEKS FOR  BMET AND CBC    If you have labs (blood work) drawn today and your tests are completely normal, you will receive your results only by: MyChart Message (if you have MyChart) OR A paper copy in the mail If you have any lab test that is abnormal or we need to change your treatment, we will call you to review the results.   Testing/Procedures: SEE BELOW FOR INSTRUCTIONS  FOR DEVICE CHANGE ON 09-28-22    Follow-Up: At [facility], you and your health needs are our priority.  As part of our continuing mission to provide you with exceptional heart care, we have created designated Provider Care Teams.  These Care Teams include your primary Cardiologist (physician) and Advanced Practice Providers (APPs -  Physician Assistants and Nurse Practitioners) who all work together to provide you with the care you need, when you need it.  We recommend signing up for the patient portal called "MyChart".  Sign up information is provided on this After Visit Summary.  MyChart is used to connect with patients for Virtual Visits (Telemedicine).  Patients are able to view lab/test results, encounter notes, upcoming appointments, etc.  Non-urgent messages can be sent to your provider as well.   To learn more about what you can do with MyChart, go to [email or web address].    Your next appointment: AFTER 09-28-22  10-14 DAYS DEVICE CLINIC  POST 3 month(s)  Provider:   Lewayne Bunting, MD  PHYS PACER [facility]    Other Instructions      Implantable Device Instructions    Victor Osborne [facility]  09/02/2022  You are scheduled for a Permanent transvenous pacemaker (PPM) on Wednesday, September  11 with Dr. Lewayne Bunting.  1. Pre procedure Lab testing: You will come to the [facility] office on Monday, August 26 any time between 8:00 am and 4:30 pm.  You do NOT need to be fasting.   2. Please arrive at the [facility] (Main Entrance A) at [facility]: [address] [place], [place] [address] at 6:30 AM (This time is 2 hour(s) before your procedure to ensure your preparation). Free valet parking service is available. You will check in at ADMITTING. The support person will be asked to wait in the waiting room.  It is OK to have someone drop you off and come back when you are ready to be discharged.          Special note: Every effort is made to have your procedure done on time. Please understand that emergencies sometimes delay  scheduled procedures.  3.  No eating or drinking after midnight prior to procedure.     4.  Medication instructions:  On the morning of your procedure hold your Plavix (Clopidogrel) for 3 day(s) prior to your procedure. Your last dose will be Sunday, September 8, PM dose.      !!IF ANY NEW MEDICATIONS ARE STARTED AFTER TODAY, PLEASE NOTIFY YOUR PROVIDER AS SOON AS POSSIBLE!!  FYI: Medications such as Semaglutide (Ozempic, [place]), Tirzepatide (Mounjaro, Zepbound), Dulaglutide (Trulicity), etc ("GLP1 agonists") AND Canagliflozin (Invokana), Dapagliflozin (Farxiga), Empagliflozin (Jardiance), Ertugliflozin (Steglatro), Bexagliflozin [employer]) or any combination with one of these  drugs such as Invokamet (Canagliflozin/Metformin), Synjardy (Empagliflozin/Metformin), etc ("SGLT2 inhibitors") must be held around the time of a procedure. This is not a comprehensive list of all of these drugs. Please review all of your medications and talk to your provider if you take any one of these. If you are not sure, ask your provider.   5.  The night before your procedure and the morning of your procedure scrub your neck/chest with CHG surgical scrub.  See instruction  letter.  6. Plan to go home the same day, you will only stay overnight if medically necessary. 7. You MUST have a responsible adult to drive you home. 8. An adult MUST be with you the first 24 hours after you arrive home. 9. Bring a current list of your medications, and the last time and date medication taken. 10. Bring ID and current insurance cards. 11. Please wear clothes that are easy to get on and off and wear slip-on shoes.    You will follow up with the [facility] Device clinic 10-14 days after your procedure.  You will follow up with Dr. Lewayne Bunting 91 days after your procedure.  These appointments will be made for you.   * If you have ANY questions after you get home, please call the office at [phone] or send a MyChart message.  FYI: For your safety, and to allow [place] to monitor your vital signs accurately during the surgery/procedure we request that if you have artificial nails, gel coating, SNS etc. Please have those removed prior to your surgery/procedure. Not having the nail coverings /polish removed may result in cancellation or delay of your surgery/procedure.    [place] - Preparing For Surgery    Before surgery, you can play an important role. Because skin is not sterile, your skin needs to be as free of germs as possible. You can reduce the number of germs on your skin by washing with CHG (chlorahexidine gluconate) Soap before surgery.  CHG is an antiseptic cleaner which kills germs and bonds with the skin to continue killing germs even after washing.  Please do not use if you have an allergy to CHG or antibacterial soaps.  If your skin becomes reddened/irritated stop using the CHG.   Do not shave (including legs and underarms) for at least 48 hours prior to first CHG shower.  It is OK to shave your face.  Please follow these instructions carefully:  1.  Shower the night before surgery and the morning of surgery with CHG.  2.  If you choose to wash your hair, wash  your hair first as usual with your normal shampoo.  3.  After you shampoo, rinse your hair and body thoroughly to remove the shampoo.  4.  Use CHG as you would any other liquid soap.  You can apply CHG directly to the skin and wash gently with a clean washcloth. 5.  Apply the CHG Soap to your body ONLY FROM THE NECK DOWN.  Do not use on open wounds or open sores.  Avoid contact with your eyes, ears, mouth and genitals (private parts).  Wash genitals (private parts) with your normal soap.  6.  Wash thoroughly, paying special attention to the area where your surgery will be performed.  7.  Thoroughly rinse your body with warm water from the neck down.   8.  DO NOT shower/wash with your normal soap after using and rinsing off the CHG soap.  9.  Pat yourself dry with a  clean towel.           10.  Wear clean pajamas.           11.  Place clean sheets on your bed the night of your first shower and do not sleep with pets.  Day of Surgery: Do not apply any deodorants/lotions.  Please wear clean clothes to the hospital/surgery center.

## 2022-09-02 NOTE — Progress Notes (Signed)
Cardiology Office Note Date:  09/02/2022  Patient ID:  Victor Osborne, Victor Osborne 1932/08/04, MRN [id number] PCP:  Olive Bass, FNP  Electrophysiologist: Dr. Ladona Ridgel    Chief Complaint:  6 mo   History of Present Illness: Victor Osborne is a 89 y.o. 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 Ladona Ridgel 12/07/20, doing well, no changes were made.  He saw S. Riddle, NP 02/09/22, compliant with his compression socks, hydration, doing well, no changes were made  TODAY He reports he is doing "great!" Has a long his of orthostatic dizziness that he is well aquatinted with, stands slowly, denies falls or faints Active with PT they check his BPs regularly with usual drop with standing No CP, palpitations No SOB He is on Plavix for concerns of possible stroke in the past    Device information BSCi dual chamber PPM implanted 06/19/2013  K064 device on advisory   Past Medical History:  Diagnosis Date   Bipolar 1 disorder (HCC)    COPD (chronic obstructive pulmonary disease) (HCC)    Depression    First degree AV block    Hypertension    pt denies but was in medical record   Hypoglycemia, unspecified    Myalgia and myositis, unspecified    Obesity    Pneumonia 1970   Skin cancer (melanoma) (HCC)    Thyroid disease    Unspecified hypothyroidism    UPJ obstruction, congenital     Past Surgical History:  Procedure Laterality Date   APPENDECTOMY  1939   malignant melanoma removed from right forehead  2008   PERMANENT PACEMAKER INSERTION N/A 06/19/2013   Procedure: PERMANENT PACEMAKER INSERTION;  Surgeon: Marinus Maw, MD;  Location: [facility] CATH LAB;  Service: Cardiovascular;  Laterality: N/A;   right big toe  surgery  2009   TRANSURETHRAL RESECTION OF PROSTATE  02/04/2011   Procedure: TRANSURETHRAL RESECTION OF THE PROSTATE (TURP);  Surgeon: Crecencio Mc, MD;  Location: [facility] ORS;   Service: Urology;  Laterality: N/A;   URETHRA SURGERY  2010   reconstruction    Current Outpatient Medications  Medication Sig Dispense Refill   acetaminophen (TYLENOL) 325 MG tablet Take 650 mg by mouth every 6 (six) hours as needed for mild pain or headache.     alclomethasone (ACLOVATE) 0.05 % ointment Apply 1 application topically 2 (two) times daily as needed (for rash).     carbamazepine (TEGRETOL) 200 MG tablet Take 1 tablet (200 mg total) by mouth 3 (three) times daily. 270 tablet 1   carbidopa-levodopa (SINEMET IR) 25-100 MG tablet Take 2 tablets by mouth 3 (three) times daily. 7am/11am/4pm 540 tablet 0   cholecalciferol (VITAMIN D) 1000 UNITS tablet Take 2,000 Units by mouth daily.     clopidogrel (PLAVIX) 75 MG tablet Take 1 tablet (75 mg total) by mouth daily. 90 tablet 3   Coenzyme Q10 (CO Q 10 PO) Take 1 capsule by mouth daily.     ferrous sulfate 324 (65 Fe) MG TBEC Take 324 mg by mouth daily.     Flaxseed, Linseed, (FLAX SEED OIL) 1300 MG CAPS Take 1 capsule by mouth daily.     FLUoxetine (PROZAC) 10 MG capsule TAKE 1 CAPSULE EVERY DAY 90 capsule 0   Fluticasone-Umeclidin-Vilant (TRELEGY ELLIPTA) 100-62.5-25 MCG/ACT AEPB Inhale 1 puff into the lungs daily. 1 each 11   levothyroxine (LEVOTHROID) 125 MCG tablet Take 1 tablet (125 mcg  total) by mouth daily before breakfast. 90 tablet 3   MAGNESIUM PO Take 1 capsule by mouth daily.     Multiple Vitamin (MULITIVITAMIN WITH MINERALS) TABS Take 1 tablet by mouth daily.     Omega-3 Fatty Acids (OMEGA-3 CF PO) Take 1,200 mg by mouth daily.     POTASSIUM GLUCONATE PO Take 1 capsule by mouth daily.     pravastatin (PRAVACHOL) 80 MG tablet TAKE 1 TABLET(80 MG) BY MOUTH DAILY 90 tablet 1   QUEtiapine (SEROQUEL XR) 50 MG TB24 24 hr tablet Take 1 tablet (50 mg total) by mouth at bedtime. 90 tablet 1   tamsulosin (FLOMAX) 0.4 MG CAPS capsule Take 0.4 mg by mouth at bedtime.     No current facility-administered medications for this visit.     Allergies:   Patient has no known allergies.   Social History:  The patient  reports that he has quit smoking. His smoking use included cigarettes. He has a 80 pack-year smoking history. He has never used smokeless tobacco. He reports that he does not drink alcohol and does not use drugs.   Family History:  The patient's family history includes Cerebral aneurysm in his mother; Drug abuse in his daughter; Lung cancer in his father.  ROS:  Please see the history of present illness.    All other systems are reviewed and otherwise negative.   PHYSICAL EXAM:  VS:  There were no vitals taken for this visit. BMI: There is no height or weight on file to calculate BMI. Well nourished, well developed, in no acute distress HEENT: normocephalic, atraumatic Neck: no JVD, carotid bruits or masses Cardiac:   RRR; no significant murmurs, no rubs, or gallops Lungs:  CTA b/l, no wheezing, rhonchi or rales Abd: soft, nontender MS: no deformity or atrophy Ext: no edema Skin: warm and dry, no rash Neuro:  No gross deficits appreciated Psych: euthymic mood, full affect  PPM site is stable, no tethering or discomfort   EKG:  not done today   Device interrogation done today and reviewed by myself:  Battery and lead measurements are stable Battery estimate is 2.5 years AP 92% VP 72% No arrhythmias Not dependent today w/SB 50's  03/15/22 TTE 1. Left ventricular ejection fraction, by estimation, is 55 to 60%. The  left ventricle has normal function. Left ventricular endocardial border  not optimally defined to evaluate regional wall motion. Left ventricular  diastolic function could not be  evaluated.   2. Right ventricular systolic function is normal. The right ventricular  size is normal. Tricuspid regurgitation signal is inadequate for assessing  PA pressure.   3. The mitral valve is grossly normal. No evidence of mitral valve  regurgitation.   4. The aortic valve is tricuspid. Aortic  valve regurgitation is not  visualized. No aortic stenosis is present.   5. The inferior vena cava is dilated in size with >50% respiratory  variability, suggesting right atrial pressure of 8 mmHg.     TTE 03/31/2021  1. Left ventricular ejection fraction, by estimation, is 65 to 70%. The left ventricle has normal function. The left ventricle has no regional wall motion abnormalities. There is mild left ventricular hypertrophy. Left ventricular diastolic parameters are consistent with Grade I diastolic dysfunction (impaired relaxation).   2. Right ventricular systolic function is normal. The right ventricular size is mildly enlarged. Tricuspid regurgitation signal is inadequate for assessing PA pressure.   3. Right atrial size was mildly dilated.   4. The mitral valve  is normal in structure. No evidence of mitral valve regurgitation. No evidence of mitral stenosis.   5. The aortic valve was not well visualized. Aortic valve regurgitation is not visualized. No aortic stenosis is present.   Recent Labs: 03/14/2022: ALT <5; BUN 28; Potassium 4.0; Sodium 138 03/15/2022: Creatinine, Ser 0.99; Hemoglobin 12.5; Platelets 160; TSH 4.142  03/15/2022: Cholesterol 159; HDL 75; LDL Cholesterol 72; Total CHOL/HDL Ratio 2.1; Triglycerides 59; VLDL 12   CrCl cannot be calculated (Patient's most recent lab result is older than the maximum 21 days allowed.).   Wt Readings from Last 3 Encounters:  07/26/22 193 lb 4.8 oz (87.7 kg)  04/07/22 197 lb 9.6 oz (89.6 kg)  03/22/22 198 lb 12.8 oz (90.2 kg)     Other studies reviewed: Additional studies/records reviewed today include: summarized above  ASSESSMENT AND PLAN:  PPM On advisory D/w industry, once <4 years recommend consideration for generator change In d/w dr. Ladona Ridgel Plan non-enmergent gen change  D/w the patient advisory and recommendation to replace his PPM Discussed the procedure, potential risks, he is agreeable Plan to hold plavix 3 days  ahead   HTN > orthostatic hypotension Looks good today Works closely with PT No syncope, no falls    Disposition: F/u with usual post procedure follow up    Current medicines are reviewed at length with the patient today.  The patient did not have any concerns regarding medicines.  Norma Fredrickson, PA-C 09/02/2022 5:12 AM     [facility] [address] [place] [address] [phone] (office)  [phone] (fax)

## 2022-09-03 LAB — CUP PACEART INCLINIC DEVICE CHECK
Date Time Interrogation Session: 20240816075852
Implantable Lead Connection Status: 753985
Implantable Lead Connection Status: 753985
Implantable Lead Implant Date: 20150603
Implantable Lead Implant Date: 20150603
Implantable Lead Location: 753859
Implantable Lead Location: 753860
Implantable Lead Model: 4136
Implantable Lead Model: 4137
Implantable Lead Serial Number: 29477746
Implantable Lead Serial Number: 29617326
Implantable Pulse Generator Implant Date: 20150603
Lead Channel Pacing Threshold Amplitude: 0.5 V
Lead Channel Pacing Threshold Amplitude: 1.3 V
Lead Channel Pacing Threshold Pulse Width: 0.4 ms
Lead Channel Pacing Threshold Pulse Width: 0.4 ms
Lead Channel Sensing Intrinsic Amplitude: 10 mV
Lead Channel Sensing Intrinsic Amplitude: 2.6 mV
Pulse Gen Serial Number: 389736

## 2022-09-07 ENCOUNTER — Encounter: Payer: Self-pay | Admitting: Physical Therapy

## 2022-09-07 ENCOUNTER — Ambulatory Visit: Payer: Medicare Other | Attending: Neurology | Admitting: Physical Therapy

## 2022-09-07 DIAGNOSIS — R2689 Other abnormalities of gait and mobility: Secondary | ICD-10-CM | POA: Insufficient documentation

## 2022-09-07 DIAGNOSIS — M6281 Muscle weakness (generalized): Secondary | ICD-10-CM | POA: Diagnosis present

## 2022-09-07 DIAGNOSIS — R293 Abnormal posture: Secondary | ICD-10-CM | POA: Insufficient documentation

## 2022-09-07 DIAGNOSIS — R2681 Unsteadiness on feet: Secondary | ICD-10-CM | POA: Diagnosis present

## 2022-09-07 NOTE — Therapy (Signed)
OUTPATIENT PHYSICAL THERAPY TREATMENT / DISCHARGE SUMMARY   Patient Name: Brettley Nieves MRN: [id number] DOB:03-09-1932, 89 y.o., male Today's Date: 09/07/2022   END OF SESSION:  PT End of Session - 09/07/22 1148     Visit Number 19    Date for PT Re-Evaluation 08/23/22    Authorization Type Medicare & [employer]    PT Start Time 1148    PT Stop Time 1228    PT Time Calculation (min) 40 min    Activity Tolerance Patient tolerated treatment well    Behavior During Therapy WFL for tasks assessed/performed                Past Medical History:  Diagnosis Date   Bipolar 1 disorder (HCC)    COPD (chronic obstructive pulmonary disease) (HCC)    Depression    First degree AV block    Hypertension    pt denies but was in medical record   Hypoglycemia, unspecified    Myalgia and myositis, unspecified    Obesity    Pneumonia 1970   Skin cancer (melanoma) (HCC)    Thyroid disease    Unspecified hypothyroidism    UPJ obstruction, congenital    Past Surgical History:  Procedure Laterality Date   APPENDECTOMY  1939   malignant melanoma removed from right forehead  2008   PERMANENT PACEMAKER INSERTION N/A 06/19/2013   Procedure: PERMANENT PACEMAKER INSERTION;  Surgeon: Marinus Maw, MD;  Location: [facility] CATH LAB;  Service: Cardiovascular;  Laterality: N/A;   right big toe  surgery  2009   TRANSURETHRAL RESECTION OF PROSTATE  02/04/2011   Procedure: TRANSURETHRAL RESECTION OF THE PROSTATE (TURP);  Surgeon: Crecencio Mc, MD;  Location: [facility] ORS;  Service: Urology;  Laterality: N/A;   URETHRA SURGERY  2010   reconstruction   Patient Active Problem List   Diagnosis Date Noted   Hyperlipidemia 03/15/2022   TIA (transient ischemic attack) 03/14/2022   History of neoplasm 04/12/2021   Melanocytic nevi of trunk 04/12/2021   Other seborrheic keratosis 04/12/2021   Cerebral embolism with cerebral infarction 03/31/2021   Left leg weakness 03/30/2021   Chronic post-traumatic  stress disorder 12/05/2017   Bipolar I disorder, current or most recent episode depressed, in partial remission (HCC) 12/05/2017   CHB (complete heart block) (HCC) 01/27/2017   HTN (hypertension) 01/27/2017   Hypothyroidism 10/11/2016   Parkinson disease 09/08/2016   Chronic osteoarthritis 03/20/2015   Bipolar 1 disorder (HCC) 03/20/2015   Tremor 03/20/2015   Pacemaker 09/24/2013   SSS (sick sinus syndrome) (HCC) 06/19/2013   First degree AV block 04/09/2013   Allergic rhinitis 03/17/2013   Obstructive sleep apnea 12/18/2006   COPD mixed type (HCC) 12/18/2006   History of malignant melanoma of skin 12/18/2006   Diabetes mellitus (HCC) 12/18/2006    PCP: Olive Bass, FNP   REFERRING PROVIDER: Vladimir Faster, DO   REFERRING DIAG:  G45.9 (ICD-10-CM) - TIA (transient ischemic attack)  G20.A1 (ICD-10-CM) - Parkinson's disease without dyskinesia or fluctuating manifestations   THERAPY DIAG:  Other abnormalities of gait and mobility  Unsteadiness on feet  Muscle weakness (generalized)  Abnormal posture  RATIONALE FOR EVALUATION AND TREATMENT: Rehabilitation  ONSET DATE: 03/14/22 - TIA; Initial PD diagnosis >15 yrs ago  NEXT MD VISIT: 10/26/22   SUBJECTIVE:  SUBJECTIVE STATEMENT: Pt reports he has had a rough couple of weeks with a lot of stress due to a close friend having issues with her bipolar disorder as well as poor sleep.  PAIN: Are you having pain? No  PERTINENT HISTORY:  PPM 2 sick sinus syndrome/SA node dysfunction & complete heart block, orthostatic hypotension, COPD, OSA, HTN, DM2, Parkinson's Disease, CVA 03/2021, hypothyroidism, PTSD, bipolar I disorder   PRECAUTIONS: Fall and ICD/Pacemaker  WEIGHT BEARING RESTRICTIONS: No  FALLS:  Has patient  fallen in last 6 months? No  LIVING ENVIRONMENT: Lives with: lives alone and with his dog Lives in: Other Independent living facility (3rd floor) Stairs:  Elevator Has following equipment at home: Single point cane, Environmental consultant - 4 wheeled, and hiking pole  OCCUPATION: Retired  PLOF: Independent and Leisure: being outdoors - walking his dog, and stays active most of the day  PATIENT GOALS: "Improve my hand-eye coordination and especially balance."   OBJECTIVE: (objective measures completed at initial evaluation unless otherwise dated)  DIAGNOSTIC FINDINGS:  03/14/22 & 03/15/22 - CT Head w/o contrast:  IMPRESSION: 1. No evidence of acute intracranial abnormality. 2. Chronic small vessel ischemic disease and cerebral atrophy. 03/14/22 - CT Angio Head & Neck: IMPRESSION: 1. Negative CTA for large vessel occlusion or other emergent finding. 2. Mild for age atheromatous change about the carotid bifurcations and carotid siphons without hemodynamically significant stenosis.  COGNITION: Overall cognitive status: Within functional limits for tasks assessed   SENSATION: WFL Except neuropathy in his feet - most notable at night  COORDINATION: Gross motor WFL B UE/LE  MUSCLE LENGTH: Hamstrings: mod tight B ITB: mod tight L, mild tight R Piriformis: mild tight B Hip flexors: mod tight B Quads: mod tight B Heelcord: mod tight L, mild tight R  POSTURE:  rounded shoulders, forward head, and flexed trunk   LOWER EXTREMITY ROM:    Grossly WFL  LOWER EXTREMITY MMT:    MMT Right Eval Left Eval R 07/14/22 L 07/14/22  Hip flexion 4 4 4+ 4+  Hip extension 4 4 4 4   Hip abduction 4+ 4+ 4+ 4+  Hip adduction 4+ 4+ 4+ 5  Hip internal rotation 4+ 4+ 5 5  Hip external rotation 4 4 4 4   Knee flexion 5 5 5 5   Knee extension 5 5 5 5   Ankle dorsiflexion 4+ 4+ 5 4+  Ankle plantarflexion      Ankle inversion      Ankle eversion      (Blank rows = not tested)  BED MOBILITY:  Sit to supine  Complete Independence Supine to sit Complete Independence Rolling to Right Complete Independence  TRANSFERS: Assistive device utilized: None  Sit to stand: Complete Independence Stand to sit: Complete Independence Chair to chair: Modified independence Floor:  NT  GAIT: Distance walked: 100 ft Assistive device utilized: Environmental consultant - 4 wheeled and None Level of assistance: Modified independence and SBA Gait pattern: step through pattern, decreased arm swing- Right, decreased arm swing- Left, and trunk flexed Comments: more pronounced fwd head, flexed trunk posture noted than on previous PT episodes  RAMP: Level of Assistance:  NT Assistive device utilized:  NT Ramp Comments:   CURB:  Level of Assistance:  NT Assistive device utilized:  NT Curb Comments:   STAIRS:  Level of Assistance: SBA  Stair Negotiation Technique: Alternating Pattern  with Single Rail on Right  Number of Stairs: 14   Height of Stairs: 7"  Comments: Pt ascends stairs with weight mostly through  ball of foot on step with heel hanging over edge of step causing slight drop and posterior shift at times, but no LOB due to grip on railing  FUNCTIONAL TESTS:  5 times sit to stand: 15.96 sec Timed up and go (TUG): Normal = 12.56 sec with rollator, 9.09 sec w/o AD; Manual = 9.32 sec; Cognitive = 14.34 sec 10 meter walk test: 7.97 sec with rollator, 7.59 sec w/o AD; Gait speed = 4.12 ft/sec with rollator, 4.32 ft/sec w/o AD,  Berg Balance Scale: 43/56; 37-45 = significant risk for falls (>80%)  (06/09/22) Functional gait assessment: 17/30; < 19 = high risk fall  06/27/22: 5xSTS = 16.25 sec  06/29/22: 5xSTS = 15 sec  07/12/22:  5xSTS = 13.65 sec with slight UE assist, 12.87 sec w/o UE assist Berg = 51/56; 46-51 moderate risk for falls (>50%)   07/14/22: : 7.03 sec w/o AD Gait speed: 4.67 ft/sec w/o AD FGA: 23/30  Scores as of discharge from last PT episode - December 2023: 5 times sit to stand: 12.13  sec Timed up and go (TUG): Normal = 10.45 sec; Manual = 11.45 sec; Cognitive = 15.15 sec 10 meter walk test: 7.5 sec (no AD); Gait speed = 4.37 ft/sec Berg: 53/56; 52-55 lower (> 25%) fall risk Functional gait assessment: 27/30; 25-28 = low risk fall   PATIENT SURVEYS:  ABC scale 1240 / 1600 = 77.5 % 09/07/22: 1170 / 1600 = 73.1 %   TODAY'S TREATMENT:              09/07/22 THERAPEUTIC EXERCISE: to improve flexibility, strength and mobility.  Verbal and tactile cues throughout for technique. NuStep - L6 x 7 min (UE/LE to promote improved reciprocal movement patterns) Standing PWR! Up with RTB resistance added to scap retraction x 10 Final review of HEP and PWR! Moves clarifying recommended frequency for ongoing home performance  THERAPEUTIC ACTIVITIES: ABC scale: 1170 / 1600 = 73.1 % FGA = 25/30; 25-28 = low risk fall  Goal assessment   08/12/22 THERAPEUTIC EXERCISE: to improve flexibility, strength and mobility.  Verbal and tactile cues throughout for technique. NuStep - L6 x 6 min (UE/LE to promote improved reciprocal movement patterns) Initiated HEP review - pt denying need for further practice of any exercises or progression of resistance bands  SELF CARE:  Reviewed information in monthly [employer]'s emails  and provided handout on community focused exercise programs for pt's with PD or movement disorders   THERAPEUTIC ACTIVITIES: 5xSTS = 14.06 sec w/o UE assist TUG: Normal = 8.34 sec w/o AD; Manual = 9.16 sec; Cognitive = 14.06 sec : 6.44 sec w/o AD Gait speed: 5.09 ft/sec w/o AD Berg = 52/56; 52-55 lower fall risk (> 25%)  NEUROMUSCULAR RE-EDUCATION: To improve balance, proprioception, coordination, reduce fall risk, amplitude of movement, speed of movement to reduce bradykinesia, and reduce rigidity. PWR! 90 Pivot turns initially between 2 colored dots working toward 1/4 turn without need for visual targets 2 x 10 each side, cues to avoid looking down at  feet   08/09/22 THERAPEUTIC EXERCISE: to improve flexibility, strength and mobility.  Verbal and tactile cues throughout for technique. NuStep - L6 x 6 min (UE/LE to promote improved reciprocal movement patterns) TRX squat 2 x 10, 2nd set with triple extension heel raise on return to standing TRX split squat x 10 with each foot fwd Lateral step over 1/2 FR with TRX support x 10 TRX low row x 10 - cues for abdominal and glute  muscle engagement to avoid dropping hips back Wall pushups on green Pball x 10  NEUROMUSCULAR RE-EDUCATION: To improve balance, proprioception, coordination, reduce fall risk, amplitude of movement, speed of movement to reduce bradykinesia, and reduce rigidity. PWR! 90 Pivot turns between 2 colored dots x 10 each side   PATIENT EDUCATION:  Education details: recommended frequency for ongoing HEP at discharge to prevent loss of gains achieved with PT and reviewed information in monthly [employer] and handouts on community focused exercise programs for pt's with PD or movement disorders to prevent loss of gains achieved with PT and slow progression of PD related deficits    Person educated: Patient Education method: Explanation and Handouts Education comprehension: verbalized understanding  HOME EXERCISE PROGRAM: Access Code: [email or web address] URL: [email or web address] Date: 09/07/2022 Prepared by: Glenetta Hew  Exercises - Seated Hamstring Stretch  - 2 x daily - 7 x weekly - 3 reps - 30 sec hold - Seated Figure 4 Piriformis Stretch  - 2 x daily - 7 x weekly - 3 reps - 30 sec hold - Standing Hip Flexor Stretch  - 2 x daily - 7 x weekly - 3 reps - 30 sec hold - Standing ITB Stretch  - 2 x daily - 7 x weekly - 3 reps - 30 sec hold - Marching with Resistance  - 1 x daily - 7 x weekly - 2 sets - 10 reps - 3 sec hold - Standing Hip Extension with Resistance at Ankles and Counter Support  - 1 x daily - 7 x weekly - 2 sets - 10 reps - 3 sec  hold - Wall Push Up  - 1 x daily - 7 x weekly - 2 sets - 10 reps - 3 sec hold - Serratus Forearm Push Up Plus at Wall with Elbows  - 1 x daily - 7 x weekly - 2 sets - 10 reps - 3 sec hold - Shoulder extension with resistance - Neutral  - 1 x daily - 3 x weekly - 2 sets - 10 reps - 5 sec hold  Patient Education - Postural Hypotension - Orthostatic Hypotension  PWR! Moves - Seated - Supine - Standing - Quadruped/All 4's - Prone  Access Code: [email or web address] from previous episode   ASSESSMENT:  CLINICAL IMPRESSION: Jefferie Meyering" returns today for final goal assessment after appointment delayed due to transportation issues.  He admits to limited performance of his HEP over the past few weeks due to stressful situation with a close friend.  HEP and PWR! Moves reviewed with patient reporting no concerns or need for practice, although we did work on adding resistance to standing PWR! Up to further encourage scapular and postural strengthening.  FGA completed with score improved to 25/30, indicating a reduced fall risk to a low fall risk.  Despite gains on all standardized balance testing, ABC scale does not show improved balance confidence, although patient feels like this may be related to his current stress levels related to his friend.  All PT goals now met with exception of ABC scale goal, and Khyle Peaster" feels ready to transition to his HEP, therefore will proceed with discharge from physical therapy for this episode.  Will plan to call him in ~5 months to set up his next 75-month PD screen, but he is aware that he can reach out to Dr. Arbutus Leas if needs were to arise for PT prior to this time.  OBJECTIVE IMPAIRMENTS: Abnormal gait, decreased activity tolerance, decreased balance, decreased coordination, decreased endurance,  decreased mobility, difficulty walking, decreased strength, decreased safety awareness, impaired perceived functional ability, impaired flexibility, improper body mechanics, and  postural dysfunction.   ACTIVITY LIMITATIONS: carrying, lifting, standing, squatting, stairs, transfers, bed mobility, reach over head, locomotion level, and caring for others  PARTICIPATION LIMITATIONS: meal prep, cleaning, laundry, driving, shopping, and community activity  PERSONAL FACTORS: Age, Fitness, Past/current experiences, Time since onset of injury/illness/exacerbation, and 3+ comorbidities: PPM 2 sick sinus syndrome/SA node dysfunction & complete heart block, orthostatic hypotension, COPD, OSA, HTN, DM2, Parkinson's Disease, CVA 03/2021, hypothyroidism, PTSD, bipolar I disorder   are also affecting patient's functional outcome.   REHAB POTENTIAL: Good  CLINICAL DECISION MAKING: Evolving/moderate complexity  EVALUATION COMPLEXITY: Moderate   GOALS: Goals reviewed with patient? Yes  SHORT TERM GOALS: Target date: 06/28/2022  Patient will be independent with initial HEP. Baseline:  Goal status: MET  06/16/22   2.  Patient will improve 5x STS time to </= 14 seconds to demonstrate improved functional strength and transfer efficiency.. Baseline: 15.96 sec Goal status: MET  07/12/22 - 13.65 seconds with UE assist, 12.87 sec w/o UE assist  3.  Patient will be educated on strategies to decrease risk of falls.  Baseline:  Goal status: MET  06/27/22  LONG TERM GOALS: Target date: 07/19/2022, extended to 08/23/2022  Patient will be independent with ongoing/advanced HEP for self-management at home incorporating PWR! Moves as indicated .  Baseline:  Goal status: MET  09/07/22  2.  Patient will be able to ambulate at least 800 ft with LRAD including outdoor and unlevel surfaces, independently with no LOB, for improved community gait. Baseline:  Goal status: MET  07/14/22  3.  Patient will be able to step up/down curb safely with LRAD for safety with community ambulation.  Baseline:  Goal status: MET  07/14/22   4.  Patient will demonstrate at least 25/30 on FGA to improve gait  stability and reduce risk for falls. (MCID = 4 points) Baseline: 17/30 Goal status: MET  07/14/22 - 23/30; 09/07/22 - 25/30  5.  Patient will improve Berg score to >/= 52/56 to improve safety and stability with ADLs in standing and reduce risk for falls. (MCID = 8 points)   Baseline: 43/56 Goal status: MET  08/12/22 - 52/56  6.  Patient will report >/= 85% on ABC scale to demonstrate improved balance confidence and decreased risk for falls. Baseline: 1240 / 1600 = 77.5 % Goal status: NOT MET  09/07/22 - 1170 / 1600 = 73.1 %  7. Patient will verbalize understanding of local Parkinson's disease community resources, including community fitness post d/c. Baseline:  Goal status: MET  08/12/22 - Pt receives monthly [employer] - reviewed community focused exercise programs for pt's with PD or movement disorders with handout provided   PLAN:  PT FREQUENCY: 2x/week  PT DURATION: 6 weeks  PLANNED INTERVENTIONS: Therapeutic exercises, Therapeutic activity, Neuromuscular re-education, Balance training, Gait training, Patient/Family education, Self Care, Joint mobilization, Stair training, DME instructions, Manual therapy, and Re-evaluation  PLAN FOR NEXT SESSION: transition to HEP with D/C from PT; f/u with 39-month PD screen   PHYSICAL THERAPY DISCHARGE SUMMARY  Visits from Start of Care: 19  Current functional level related to goals / functional outcomes: Refer to above clinical impression and goal assessment.   Remaining deficits: As above.  Perceived balance confidence per ABC scale not reflecting improvements noted on standardized balance testing.   Education / Equipment: HEP, PWR! Moves, [employer] and community exercise  programs  Patient agrees to discharge. Patient goals were mostly met. Patient is being discharged due to being pleased with the current functional level.   Marry Guan, PT 09/07/2022, 12:33 PM

## 2022-09-12 ENCOUNTER — Other Ambulatory Visit: Payer: Self-pay | Admitting: *Deleted

## 2022-09-12 DIAGNOSIS — I495 Sick sinus syndrome: Secondary | ICD-10-CM

## 2022-09-13 ENCOUNTER — Ambulatory Visit (INDEPENDENT_AMBULATORY_CARE_PROVIDER_SITE_OTHER): Payer: Medicare Other

## 2022-09-13 DIAGNOSIS — I495 Sick sinus syndrome: Secondary | ICD-10-CM | POA: Diagnosis not present

## 2022-09-13 LAB — CUP PACEART REMOTE DEVICE CHECK
Battery Remaining Longevity: 30 mo
Battery Remaining Percentage: 34 %
Brady Statistic RA Percent Paced: 94 %
Brady Statistic RV Percent Paced: 80 %
Date Time Interrogation Session: 20240827002100
Implantable Lead Connection Status: 753985
Implantable Lead Connection Status: 753985
Implantable Lead Implant Date: 20150603
Implantable Lead Implant Date: 20150603
Implantable Lead Location: 753859
Implantable Lead Location: 753860
Implantable Lead Model: 4136
Implantable Lead Model: 4137
Implantable Lead Serial Number: 29477746
Implantable Lead Serial Number: 29617326
Implantable Pulse Generator Implant Date: 20150603
Lead Channel Impedance Value: 557 Ohm
Lead Channel Impedance Value: 738 Ohm
Lead Channel Pacing Threshold Amplitude: 0.5 V
Lead Channel Pacing Threshold Amplitude: 1.9 V
Lead Channel Pacing Threshold Pulse Width: 0.4 ms
Lead Channel Pacing Threshold Pulse Width: 0.5 ms
Lead Channel Setting Pacing Amplitude: 2 V
Lead Channel Setting Pacing Amplitude: 2 V
Lead Channel Setting Pacing Pulse Width: 0.4 ms
Lead Channel Setting Sensing Sensitivity: 2.5 mV
Pulse Gen Serial Number: 389736
Zone Setting Status: 755011

## 2022-09-13 LAB — BASIC METABOLIC PANEL
BUN/Creatinine Ratio: 24 (ref 10–24)
BUN: 27 mg/dL (ref 8–27)
CO2: 23 mmol/L (ref 20–29)
Calcium: 9 mg/dL (ref 8.6–10.2)
Chloride: 108 mmol/L — ABNORMAL HIGH (ref 96–106)
Creatinine, Ser: 1.14 mg/dL (ref 0.76–1.27)
Glucose: 79 mg/dL (ref 70–99)
Potassium: 4.7 mmol/L (ref 3.5–5.2)
Sodium: 146 mmol/L — ABNORMAL HIGH (ref 134–144)
eGFR: 62 mL/min/{1.73_m2} (ref >59)

## 2022-09-13 LAB — CBC
Hematocrit: 39.9 % (ref 37.5–51.0)
Hemoglobin: 13 g/dL (ref 13.0–17.7)
MCH: 32.3 pg (ref 26.6–33.0)
MCHC: 32.6 g/dL (ref 31.5–35.7)
MCV: 99 fL — ABNORMAL HIGH (ref 79–97)
Platelets: 166 10*3/uL (ref 150–450)
RBC: 4.02 x10E6/uL — ABNORMAL LOW (ref 4.14–5.80)
RDW: 12.7 % (ref 11.6–15.4)
WBC: 5.6 10*3/uL (ref 3.4–10.8)

## 2022-09-16 ENCOUNTER — Ambulatory Visit: Payer: Medicare Other

## 2022-09-26 NOTE — Progress Notes (Signed)
Remote pacemaker transmission.   

## 2022-09-27 NOTE — Pre-Procedure Instructions (Signed)
Attempted to call patient regarding procedure instructions.  Left voicemail on the following items: Arrival time 0600 Nothing to eat or drink after midnight No meds AM of procedure Responsible person to drive you home and stay with you for 24 hrs Wash with special soap night before and morning of procedure

## 2022-09-28 ENCOUNTER — Other Ambulatory Visit: Payer: Self-pay

## 2022-09-28 ENCOUNTER — Encounter (HOSPITAL_COMMUNITY)
Admission: RE | Disposition: A | Discharge status: Home / Self Care | Payer: Self-pay | Source: Ambulatory Visit | Attending: Internal Medicine

## 2022-09-28 ENCOUNTER — Ambulatory Visit (HOSPITAL_COMMUNITY)
Admission: RE | Admit: 2022-09-28 | Discharge: 2022-09-28 | Disposition: A | Discharge status: Home / Self Care | Payer: Medicare Other | Source: Ambulatory Visit | Attending: Internal Medicine | Admitting: Internal Medicine

## 2022-09-28 DIAGNOSIS — Z7902 Long term (current) use of antithrombotics/antiplatelets: Secondary | ICD-10-CM | POA: Diagnosis not present

## 2022-09-28 DIAGNOSIS — I10 Essential (primary) hypertension: Secondary | ICD-10-CM | POA: Diagnosis not present

## 2022-09-28 DIAGNOSIS — I959 Hypotension, unspecified: Secondary | ICD-10-CM | POA: Insufficient documentation

## 2022-09-28 DIAGNOSIS — I495 Sick sinus syndrome: Secondary | ICD-10-CM | POA: Diagnosis not present

## 2022-09-28 DIAGNOSIS — I442 Atrioventricular block, complete: Secondary | ICD-10-CM | POA: Insufficient documentation

## 2022-09-28 DIAGNOSIS — Z45010 Encounter for checking and testing of cardiac pacemaker pulse generator [battery]: Secondary | ICD-10-CM | POA: Diagnosis present

## 2022-09-28 DIAGNOSIS — Z8673 Personal history of transient ischemic attack (TIA), and cerebral infarction without residual deficits: Secondary | ICD-10-CM | POA: Insufficient documentation

## 2022-09-28 DIAGNOSIS — Z87891 Personal history of nicotine dependence: Secondary | ICD-10-CM | POA: Diagnosis not present

## 2022-09-28 DIAGNOSIS — G20A1 Parkinson's disease without dyskinesia, without mention of fluctuations: Secondary | ICD-10-CM | POA: Diagnosis not present

## 2022-09-28 HISTORY — PX: PPM GENERATOR CHANGEOUT: EP1233

## 2022-09-28 SURGERY — PPM GENERATOR CHANGEOUT

## 2022-09-28 MED ORDER — HEPARIN (PORCINE) IN NACL 1000-0.9 UT/500ML-% IV SOLN
INTRAVENOUS | Status: DC | PRN
  Administered 2022-09-28: 500 mL

## 2022-09-28 MED ORDER — CHLORHEXIDINE GLUCONATE 4 % EX SOLN: 4.0000 | Freq: Once | CUTANEOUS | Status: DC

## 2022-09-28 MED ORDER — SODIUM CHLORIDE 0.9 % IV SOLN: INTRAVENOUS | Status: DC

## 2022-09-28 MED ORDER — POVIDONE-IODINE 10 % EX SWAB
2.0000 | Freq: Once | CUTANEOUS | Status: AC
  Administered 2022-09-28: 2 via TOPICAL

## 2022-09-28 MED ORDER — CEFAZOLIN SODIUM-DEXTROSE 2-4 GM/100ML-% IV SOLN
2.0000 g | INTRAVENOUS | Status: AC
  Administered 2022-09-28: 2 g via INTRAVENOUS

## 2022-09-28 MED ORDER — SODIUM CHLORIDE 0.9 % IV SOLN
80.0000 mg | INTRAVENOUS | Status: AC
  Administered 2022-09-28: 80 mg

## 2022-09-28 MED ORDER — LIDOCAINE HCL (PF) 1 % IJ SOLN
INTRAMUSCULAR | Status: DC | PRN
  Administered 2022-09-28: 60 mL

## 2022-09-28 MED ORDER — CEFAZOLIN SODIUM-DEXTROSE 2-4 GM/100ML-% IV SOLN
INTRAVENOUS | Status: AC
  Filled 2022-09-28: qty 100

## 2022-09-28 MED ORDER — ACETAMINOPHEN 325 MG PO TABS: 325.0000 mg | ORAL_TABLET | ORAL | Status: DC | PRN

## 2022-09-28 MED ORDER — ONDANSETRON HCL 4 MG/2ML IJ SOLN: 4.0000 mg | Freq: Four times a day (QID) | INTRAMUSCULAR | Status: DC | PRN

## 2022-09-28 MED ORDER — SODIUM CHLORIDE 0.9 % IV SOLN
INTRAVENOUS | Status: AC
  Filled 2022-09-28: qty 2

## 2022-09-28 MED ORDER — LIDOCAINE HCL 1 % IJ SOLN
INTRAMUSCULAR | Status: AC
  Filled 2022-09-28: qty 60

## 2022-09-28 SURGICAL SUPPLY — 7 items
BAG SNAP BAND KOVER 36X36 (MISCELLANEOUS) IMPLANT
CABLE SURGICAL S-101-97-12 (CABLE) ×1 IMPLANT
PACEMAKER ACCOLADE DR-EL (Pacemaker) IMPLANT
PAD DEFIB RADIO PHYSIO CONN (PAD) ×1 IMPLANT
POUCH AIGIS-R ANTIBACT PPM (Mesh General) ×1 IMPLANT
POUCH AIGIS-R ANTIBACT PPM MED (Mesh General) IMPLANT
TRAY PACEMAKER INSERTION (PACKS) ×1 IMPLANT

## 2022-09-28 NOTE — Discharge Instructions (Signed)

## 2022-09-28 NOTE — H&P (Signed)
Cardiology Office Note Date:  09/02/2022  Patient ID:  Victor Osborne, Laws [birth year], MRN [id number] PCP:  Olive Bass, FNP   Electrophysiologist: Dr. Ladona Ridgel     Chief Complaint:  6 mo     History of Present Illness: Victor Osborne is a 89 y.o. 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 Ladona Ridgel 12/07/20, doing well, no changes were made.   He saw S. Riddle, NP 02/09/22, compliant with his compression socks, hydration, doing well, no changes were made   TODAY He reports he is doing "great!" Has a long his of orthostatic dizziness that he is well aquatinted with, stands slowly, denies falls or faints Active with PT they check his BPs regularly with usual drop with standing No CP, palpitations No SOB He is on Plavix for concerns of possible stroke in the past       Device information BSCi dual chamber PPM implanted 06/19/2013  K064 device on advisory         Past Medical History:  Diagnosis Date   Bipolar 1 disorder (HCC)     COPD (chronic obstructive pulmonary disease) (HCC)     Depression     First degree AV block     Hypertension      pt denies but was in medical record   Hypoglycemia, unspecified     Myalgia and myositis, unspecified     Obesity     Pneumonia 1970   Skin cancer (melanoma) (HCC)     Thyroid disease     Unspecified hypothyroidism     UPJ obstruction, congenital                 Past Surgical History:  Procedure Laterality Date   APPENDECTOMY   1939   malignant melanoma removed from right forehead   2008   PERMANENT PACEMAKER INSERTION N/A 06/19/2013    Procedure: PERMANENT PACEMAKER INSERTION;  Surgeon: Marinus Maw, MD;  Location: [facility] CATH LAB;  Service: Cardiovascular;  Laterality: N/A;   right big toe  surgery   2009   TRANSURETHRAL RESECTION OF PROSTATE   02/04/2011    Procedure: TRANSURETHRAL RESECTION OF THE PROSTATE (TURP);   Surgeon: Crecencio Mc, MD;  Location: [facility] ORS;  Service: Urology;  Laterality: N/A;   URETHRA SURGERY   2010    reconstruction                Current Outpatient Medications  Medication Sig Dispense Refill   acetaminophen (TYLENOL) 325 MG tablet Take 650 mg by mouth every 6 (six) hours as needed for mild pain or headache.       alclomethasone (ACLOVATE) 0.05 % ointment Apply 1 application topically 2 (two) times daily as needed (for rash).       carbamazepine (TEGRETOL) 200 MG tablet Take 1 tablet (200 mg total) by mouth 3 (three) times daily. 270 tablet 1   carbidopa-levodopa (SINEMET IR) 25-100 MG tablet Take 2 tablets by mouth 3 (three) times daily. 7am/11am/4pm 540 tablet 0   cholecalciferol (VITAMIN D) 1000 UNITS tablet Take 2,000 Units by mouth daily.       clopidogrel (PLAVIX) 75 MG tablet Take 1 tablet (75 mg total) by mouth daily. 90 tablet 3   Coenzyme Q10 (CO Q 10 PO) Take 1 capsule by mouth daily.       ferrous sulfate 324 (65 Fe) MG TBEC Take 324 mg by mouth daily.  Flaxseed, Linseed, (FLAX SEED OIL) 1300 MG CAPS Take 1 capsule by mouth daily.       FLUoxetine (PROZAC) 10 MG capsule TAKE 1 CAPSULE EVERY DAY 90 capsule 0   Fluticasone-Umeclidin-Vilant (TRELEGY ELLIPTA) 100-62.5-25 MCG/ACT AEPB Inhale 1 puff into the lungs daily. 1 each 11   levothyroxine (LEVOTHROID) 125 MCG tablet Take 1 tablet (125 mcg total) by mouth daily before breakfast. 90 tablet 3   MAGNESIUM PO Take 1 capsule by mouth daily.       Multiple Vitamin (MULITIVITAMIN WITH MINERALS) TABS Take 1 tablet by mouth daily.       Omega-3 Fatty Acids (OMEGA-3 CF PO) Take 1,200 mg by mouth daily.       POTASSIUM GLUCONATE PO Take 1 capsule by mouth daily.       pravastatin (PRAVACHOL) 80 MG tablet TAKE 1 TABLET(80 MG) BY MOUTH DAILY 90 tablet 1   QUEtiapine (SEROQUEL XR) 50 MG TB24 24 hr tablet Take 1 tablet (50 mg total) by mouth at bedtime. 90 tablet 1   tamsulosin (FLOMAX) 0.4 MG CAPS capsule Take 0.4 mg by  mouth at bedtime.          No current facility-administered medications for this visit.        Allergies:   Patient has no known allergies.    Social History:  The patient  reports that he has quit smoking. His smoking use included cigarettes. He has a 80 pack-year smoking history. He has never used smokeless tobacco. He reports that he does not drink alcohol and does not use drugs.    Family History:  The patient's family history includes Cerebral aneurysm in his mother; Drug abuse in his daughter; Lung cancer in his father.   ROS:  Please see the history of present illness.    All other systems are reviewed and otherwise negative.    PHYSICAL EXAM:  VS:  There were no vitals taken for this visit. BMI: There is no height or weight on file to calculate BMI. Well nourished, well developed, in no acute distress HEENT: normocephalic, atraumatic Neck: no JVD, carotid bruits or masses Cardiac:   RRR; no significant murmurs, no rubs, or gallops Lungs:  CTA b/l, no wheezing, rhonchi or rales Abd: soft, nontender MS: no deformity or atrophy Ext: no edema Skin: warm and dry, no rash Neuro:  No gross deficits appreciated Psych: euthymic mood, full affect   PPM site is stable, no tethering or discomfort     EKG:  not done today     Device interrogation done today and reviewed by myself:  Battery and lead measurements are stable Battery estimate is 2.5 years AP 92% VP 72% No arrhythmias Not dependent today w/SB 50's   03/15/22 TTE 1. Left ventricular ejection fraction, by estimation, is 55 to 60%. The  left ventricle has normal function. Left ventricular endocardial border  not optimally defined to evaluate regional wall motion. Left ventricular  diastolic function could not be  evaluated.   2. Right ventricular systolic function is normal. The right ventricular  size is normal. Tricuspid regurgitation signal is inadequate for assessing  PA pressure.   3. The mitral valve is  grossly normal. No evidence of mitral valve  regurgitation.   4. The aortic valve is tricuspid. Aortic valve regurgitation is not  visualized. No aortic stenosis is present.   5. The inferior vena cava is dilated in size with >50% respiratory  variability, suggesting right atrial pressure of 8 mmHg.  TTE 03/31/2021  1. Left ventricular ejection fraction, by estimation, is 65 to 70%. The left ventricle has normal function. The left ventricle has no regional wall motion abnormalities. There is mild left ventricular hypertrophy. Left ventricular diastolic parameters are consistent with Grade I diastolic dysfunction (impaired relaxation).   2. Right ventricular systolic function is normal. The right ventricular size is mildly enlarged. Tricuspid regurgitation signal is inadequate for assessing PA pressure.   3. Right atrial size was mildly dilated.   4. The mitral valve is normal in structure. No evidence of mitral valve regurgitation. No evidence of mitral stenosis.   5. The aortic valve was not well visualized. Aortic valve regurgitation is not visualized. No aortic stenosis is present.    Recent Labs: 03/14/2022: ALT <5; BUN 28; Potassium 4.0; Sodium 138 03/15/2022: Creatinine, Ser 0.99; Hemoglobin 12.5; Platelets 160; TSH 4.142  03/15/2022: Cholesterol 159; HDL 75; LDL Cholesterol 72; Total CHOL/HDL Ratio 2.1; Triglycerides 59; VLDL 12    CrCl cannot be calculated (Patient's most recent lab result is older than the maximum 21 days allowed.).       Wt Readings from Last 3 Encounters:  07/26/22 193 lb 4.8 oz (87.7 kg)  04/07/22 197 lb 9.6 oz (89.6 kg)  03/22/22 198 lb 12.8 oz (90.2 kg)      Other studies reviewed: Additional studies/records reviewed today include: summarized above   ASSESSMENT AND PLAN:   PPM On advisory D/w industry, once <4 years recommend consideration for generator change In d/w dr. Ladona Ridgel Plan non-enmergent gen change   D/w the patient advisory and  recommendation to replace his PPM Discussed the procedure, potential risks, he is agreeable Plan to hold plavix 3 days ahead     HTN > orthostatic hypotension Looks good today Works closely with PT No syncope, no falls       Disposition: F/u with usual post procedure follow up       Current medicines are reviewed at length with the patient today.  The patient did not have any concerns regarding medicines.   Norma Fredrickson, PA-C 09/02/2022 5:12 AM      EP Attending  Patient seen and examined. Agree with the findings as noted above. The patient presents for PPM gen change out. He has a h/o both sinus node dysfunction and heart block and paces in both atria and ventricle. His device has been in place for 9 years and he has an advisory warning on his device. On exam he is a pleasant well appearing 89 yo man , NAD.  A/P Sinus node dysfunction/CHB - he has an advisory device with recommendation for removal and insertion of a new device. I have reviewed the indications for gen change and he wishes to proceed.  Sharlot Gowda Marilu Rylander,MD

## 2022-09-29 ENCOUNTER — Ambulatory Visit: Payer: Medicare Other | Admitting: Internal Medicine

## 2022-09-29 ENCOUNTER — Encounter (HOSPITAL_COMMUNITY): Payer: Self-pay | Admitting: Internal Medicine

## 2022-10-06 ENCOUNTER — Other Ambulatory Visit: Payer: Self-pay | Admitting: Neurology

## 2022-10-06 ENCOUNTER — Ambulatory Visit: Payer: Medicare Other | Admitting: Psychiatry

## 2022-10-06 DIAGNOSIS — G20A1 Parkinson's disease without dyskinesia, without mention of fluctuations: Secondary | ICD-10-CM

## 2022-10-07 ENCOUNTER — Other Ambulatory Visit: Payer: Self-pay

## 2022-10-07 DIAGNOSIS — F3175 Bipolar disorder, in partial remission, most recent episode depressed: Secondary | ICD-10-CM

## 2022-10-08 ENCOUNTER — Other Ambulatory Visit: Payer: Self-pay

## 2022-10-08 DIAGNOSIS — F3175 Bipolar disorder, in partial remission, most recent episode depressed: Secondary | ICD-10-CM

## 2022-10-08 MED ORDER — CARBAMAZEPINE 200 MG PO TABS
200.0000 mg | ORAL_TABLET | Freq: Three times a day (TID) | ORAL | 0 refills | Status: DC
Start: 2022-10-08 — End: 2022-11-07

## 2022-10-12 ENCOUNTER — Ambulatory Visit: Payer: Medicare Other | Attending: Cardiology

## 2022-10-12 DIAGNOSIS — I495 Sick sinus syndrome: Secondary | ICD-10-CM | POA: Diagnosis not present

## 2022-10-12 DIAGNOSIS — I442 Atrioventricular block, complete: Secondary | ICD-10-CM | POA: Insufficient documentation

## 2022-10-12 LAB — CUP PACEART INCLINIC DEVICE CHECK
Brady Statistic RA Percent Paced: 95 %
Brady Statistic RV Percent Paced: 100 %
Date Time Interrogation Session: 20240925195725
Implantable Lead Connection Status: 753985
Implantable Lead Connection Status: 753985
Implantable Lead Implant Date: 20150603
Implantable Lead Implant Date: 20150603
Implantable Lead Location: 753859
Implantable Lead Location: 753860
Implantable Lead Model: 4136
Implantable Lead Model: 4137
Implantable Lead Serial Number: 29477746
Implantable Lead Serial Number: 29617326
Implantable Pulse Generator Implant Date: 20240911
Lead Channel Impedance Value: 548 Ohm
Lead Channel Impedance Value: 718 Ohm
Lead Channel Pacing Threshold Amplitude: 0.8 V
Lead Channel Pacing Threshold Amplitude: 1.2 V
Lead Channel Pacing Threshold Amplitude: 1.2 V
Lead Channel Pacing Threshold Pulse Width: 0.4 ms
Lead Channel Pacing Threshold Pulse Width: 0.4 ms
Lead Channel Pacing Threshold Pulse Width: 0.4 ms
Lead Channel Sensing Intrinsic Amplitude: 2.4 mV
Lead Channel Sensing Intrinsic Amplitude: 9.7 mV
Lead Channel Setting Pacing Amplitude: 2.5 V
Lead Channel Setting Pacing Amplitude: 3.5 V
Lead Channel Setting Pacing Pulse Width: 0.4 ms
Lead Channel Setting Sensing Sensitivity: 2.5 mV
Pulse Gen Serial Number: 664123
Zone Setting Status: 755011

## 2022-10-12 NOTE — Patient Instructions (Signed)
After Your Pacemaker   Monitor your pacemaker site for redness, swelling, and drainage. Call the device clinic at [phone] if you experience these symptoms or fever/chills.  Your incision was closed with Steri-strips or staples:  You may shower 7 days after your procedure and wash your incision with soap and water. Avoid lotions, ointments, or perfumes over your incision until it is well-healed.  You may use a hot tub or a pool after your wound check appointment if the incision is completely closed. There are no other restrictions in arm movement after your wound check appointment.  You may drive, unless driving has been restricted by your healthcare providers. .  Remote monitoring is used to monitor your pacemaker from home. This monitoring is scheduled every 91 days by our office. It allows [place] to keep an eye on the functioning of your device to ensure it is working properly. You will routinely see your Electrophysiologist annually (more often if necessary).

## 2022-10-12 NOTE — Progress Notes (Signed)
Wound check appointment. Steri-strips removed. Wound without redness or edema. Incision edges approximated, wound well healed. Normal device function. Thresholds, sensing, and impedances consistent with implant measurements. Device programmed appropriately for chronic leads. Histogram distribution appropriate for patient and level of activity. No mode switches or high ventricular rates noted. Patient educated about wound care. There are no arm mobility or lifting restrictions. ROV in 3 months with implanting physician.

## 2022-10-13 ENCOUNTER — Telehealth: Payer: Self-pay

## 2022-10-13 NOTE — Telephone Encounter (Signed)
Ventricular pacing greater than 50%, the patient is ventricular pacing 100% of the time.   This is known and not new, patient has CHB.   Okay to turn off VP percent alert?  Just had DUAL PPM BSX gen change on 09/28/22.

## 2022-10-21 ENCOUNTER — Ambulatory Visit: Payer: Medicare Other | Admitting: Psychiatry

## 2022-10-25 NOTE — Progress Notes (Deleted)
Assessment/Plan:   1.  Parkinsons Disease  -Continue carbidopa/levodopa 25/100, 2 tablets 3 times per day  -discussed PT. I will send referral  -discussed Parkinsons Disease game night  2.  History of well-controlled bipolar  -Follows with psychiatry.  On Tegretol 200 mg tid and Seroquel XR, 50 mg at bedtime  3.  History of melanoma  -Follows with Dr. Emily Filbert  -Knows that Parkinson's slightly increases risk for future melanomas as well.  4.  Possible ACA infarct March, 2023 and another possible TIA in February, 2024  -Could not be confirmed because patients pacemaker is not MRI compatible.  -CT did not demonstrate acute infarct on either admission  -Patient on Plavix monotherapy and being managed by primary nurse practitioner.  -Now on Pravachol, 80 mg daily  5.  Orthostatic hypotension  -Unrelated to Parkinson's.  His Parkinson's is far too mild, and really has not shown significant progression over the years.  -Patient off of midodrine and doing well.    Subjective:   Victor Osborne was seen today in follow up for Parkinsons disease and more recent hospital f/u for possible stroke and orthostasis. My previous records were reviewed prior to todays visit as well as outside records available to me.  Patient has really been doing well from my standpoint.  He went to physical therapy after our last visit.  He has not had any falls.  He did have his pacemaker changed out on September 11.  Current prescribed movement disorder medications: Carbidopa/levodopa 25/100, 2 tablets 3 times per day Pravachol, 80 mg daily  ALLERGIES:  No Known Allergies  CURRENT MEDICATIONS:  Outpatient Encounter Medications as of 10/26/2022  Medication Sig   acetaminophen (TYLENOL) 325 MG tablet Take 650 mg by mouth every 6 (six) hours as needed for mild pain or headache.   alclomethasone (ACLOVATE) 0.05 % ointment Apply 1 application topically 2 (two) times daily as needed (for rash).    carbamazepine (TEGRETOL) 200 MG tablet Take 1 tablet (200 mg total) by mouth 3 (three) times daily.   carbidopa-levodopa (SINEMET IR) 25-100 MG tablet TAKE 2 TABLETS BY MOUTH THREE TIMES DAILY, AT 7 AM, 11 AM, AND 4 PM   cholecalciferol (VITAMIN D) 1000 UNITS tablet Take 2,000 Units by mouth daily.   clopidogrel (PLAVIX) 75 MG tablet Take 1 tablet (75 mg total) by mouth daily.   Coenzyme Q10 (CO Q 10 PO) Take 1 capsule by mouth daily.   ferrous sulfate 324 (65 Fe) MG TBEC Take 324 mg by mouth daily.   Flaxseed, Linseed, (FLAX SEED OIL) 1300 MG CAPS Take 1 capsule by mouth daily.   FLUoxetine (PROZAC) 10 MG capsule TAKE 1 CAPSULE EVERY DAY   Fluticasone-Umeclidin-Vilant (TRELEGY ELLIPTA) 100-62.5-25 MCG/ACT AEPB Inhale 1 puff into the lungs daily.   levothyroxine (LEVOTHROID) 125 MCG tablet Take 1 tablet (125 mcg total) by mouth daily before breakfast.   MAGNESIUM PO Take 1 capsule by mouth daily.   Multiple Vitamin (MULITIVITAMIN WITH MINERALS) TABS Take 1 tablet by mouth daily.   Omega-3 Fatty Acids (OMEGA-3 CF PO) Take 1,200 mg by mouth daily.   POTASSIUM GLUCONATE PO Take 1 capsule by mouth daily.   pravastatin (PRAVACHOL) 80 MG tablet TAKE 1 TABLET(80 MG) BY MOUTH DAILY   QUEtiapine (SEROQUEL XR) 50 MG TB24 24 hr tablet Take 1 tablet (50 mg total) by mouth at bedtime.   tamsulosin (FLOMAX) 0.4 MG CAPS capsule Take 0.4 mg by mouth at bedtime.   No facility-administered encounter medications  on file as of 10/26/2022.    Objective:   PHYSICAL EXAMINATION:    VITALS:   There were no vitals filed for this visit.    GEN:  The patient appears stated age and is in NAD.  He is cheerful and joking. HEENT:  Normocephalic, atraumatic.  The mucous membranes are moist. The superficial temporal arteries are without ropiness or tenderness. CV:  RRR Lungs:  CTAB Neck/HEME:  There are no carotid bruits bilaterally.  Neurological examination:  Orientation: The patient is alert and oriented  x3. Cranial nerves: There is good facial symmetry with minimal facial hypomimia. The speech is fluent and clear. Soft palate rises symmetrically and there is no tongue deviation. Hearing is intact to conversational tone. Sensation: Sensation is intact to light touch throughout Motor: Strength is at least antigravity x4.  Movement examination: Tone: there is mild increased tone in the RUE (has not taken his Parkinsons Disease med today) Abnormal movements: there is mild trouble with finger taps on the R Coordination:  There is  decremation with RAM's, only with toe taps on the L (same as last visit) Gait and Station: The patient pushes off to arise.  He ambulates well with the walker.  I have reviewed and interpreted the following labs independently    Chemistry      Component Value Date/Time   NA 146 (H) 09/12/2022 0000   K 4.7 09/12/2022 0000   CL 108 (H) 09/12/2022 0000   CO2 23 09/12/2022 0000   BUN 27 09/12/2022 0000   CREATININE 1.14 09/12/2022 0000   CREATININE 1.18 08/14/2020 1112      Component Value Date/Time   CALCIUM 9.0 09/12/2022 0000   ALKPHOS 81 03/14/2022 2040   AST 20 03/14/2022 2040   ALT <5 03/14/2022 2040   BILITOT 0.7 03/14/2022 2040   BILITOT 0.3 01/16/2019 0838       Lab Results  Component Value Date   WBC 5.6 09/12/2022   HGB 13.0 09/12/2022   HCT 39.9 09/12/2022   MCV 99 (H) 09/12/2022   PLT 166 09/12/2022    Lab Results  Component Value Date   TSH 4.142 03/15/2022   Lab Results  Component Value Date   CHOL 159 03/15/2022   HDL 75 03/15/2022   LDLCALC 72 03/15/2022   TRIG 59 03/15/2022   CHOLHDL 2.1 03/15/2022     Total time spent on today's visit was *** minutes, including both face-to-face time and nonface-to-face time.  Time included that spent on review of records (prior notes available to me/labs/imaging if pertinent), discussing treatment and goals, answering patient's questions and coordinating care.   Cc:  Olive Bass, FNP

## 2022-10-26 ENCOUNTER — Ambulatory Visit: Payer: Medicare Other | Admitting: Neurology

## 2022-10-27 NOTE — Telephone Encounter (Signed)
yes

## 2022-11-03 ENCOUNTER — Other Ambulatory Visit: Payer: Self-pay

## 2022-11-03 DIAGNOSIS — F3175 Bipolar disorder, in partial remission, most recent episode depressed: Secondary | ICD-10-CM

## 2022-11-03 MED ORDER — QUETIAPINE FUMARATE ER 50 MG PO TB24: 50.0000 mg | ORAL_TABLET | Freq: Every day | ORAL | 0 refills | Status: DC

## 2022-11-07 ENCOUNTER — Ambulatory Visit (INDEPENDENT_AMBULATORY_CARE_PROVIDER_SITE_OTHER): Payer: Medicare Other | Admitting: Psychiatry

## 2022-11-07 ENCOUNTER — Encounter: Payer: Self-pay | Admitting: Psychiatry

## 2022-11-07 DIAGNOSIS — F4312 Post-traumatic stress disorder, chronic: Secondary | ICD-10-CM

## 2022-11-07 DIAGNOSIS — F3175 Bipolar disorder, in partial remission, most recent episode depressed: Secondary | ICD-10-CM | POA: Diagnosis not present

## 2022-11-07 MED ORDER — QUETIAPINE FUMARATE ER 50 MG PO TB24: 50.0000 mg | ORAL_TABLET | Freq: Every day | ORAL | 1 refills | Status: DC

## 2022-11-07 MED ORDER — CARBAMAZEPINE 200 MG PO TABS
200.0000 mg | ORAL_TABLET | Freq: Three times a day (TID) | ORAL | 0 refills | Status: DC
Start: 2022-11-07 — End: 2022-12-08

## 2022-11-07 MED ORDER — FLUOXETINE HCL 10 MG PO CAPS
10.0000 mg | ORAL_CAPSULE | Freq: Every day | ORAL | 1 refills | Status: DC
Start: 2022-11-07 — End: 2023-05-09

## 2022-11-07 NOTE — Progress Notes (Signed)
Victor Osborne [id number] 1932/03/10 89 y.o.  Subjective:   Patient ID:  Victor Osborne is a 89 y.o. (DOB 1932/01/30) male.  Chief Complaint:  Chief Complaint  Patient presents with   Follow-up    Bipolar Disorder, anxiety, history of mild paranoia    HPI Victor Osborne presents to the office today for follow-up of mood disturbance and anxiety.   He reports that he has had some "paranoia... people taking advantage of me." He reports that he notices paranoia first thing in the morning and this dissipates in about 30 minutes.   He reports that his mood is "pretty centered most of the time." He reports that he has brief periods of wanting to be alone for about 1-2 hours. He enjoys encouraging others and making them laugh. He denies any recent manic symptoms- "more on the depressed side, but not bad." He denies any anger. He reports that his energy is "reducing but I think that's more with Parkinson's and getting older." He reports that his anxiety is "ok, I don't suffer from much of that." He reports adequate sleep. He reports that he is sleeping at least 8 hours. He reports that his appetite has been good. Motivation has been ok- "it's sliding a bit." He reports adequate concentration. Denies SI.   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  Denies AH or VH.   Lives alone with his dog, Lady. He enjoys watching You Tube Videos. He has breakfast with girlfriend daily. Celebrated birthday with his family and girlfriend's family.   Past Psychiatric Medication Trials: Carbamazepine Lithium Prozac Remeron Seroquel XR Ambien  AIMS    Flowsheet Row Office Visit from 11/07/2022 in [facility] Office Visit from 04/05/2022 in [facility] Office Visit from 09/16/2021 in [facility]  AIMS Total Score 4 5 1       PHQ2-9    Flowsheet Row Clinical Support from 05/23/2022 in [facility] Telephone from 11/16/2021 in [facility] Office Visit from 11/04/2021 in [facility] Clinical Support from 05/14/2021 in [facility] at [facility] Office Visit from 05/07/2021 in [facility] at [facility]  PHQ-2 Total Score 0 0 0 0 0      Flowsheet Row ED to Hosp-Admission (Discharged) from 03/14/2022 in [facility] ED from 05/11/2021 in [facility] at [facility] ED to Hosp-Admission (Discharged) from 03/30/2021 in [facility]  C-SSRS RISK CATEGORY No Risk No Risk No Risk        Review of Systems:  Review of Systems  Musculoskeletal:  Positive for gait problem.  Neurological:  Positive for weakness.       He reports that his tremor is controlled overall with medication  Psychiatric/Behavioral:         Please refer to HPI    Medications: I have reviewed the patient's current medications.  Current Outpatient Medications  Medication Sig Dispense Refill   acetaminophen (TYLENOL) 325 MG tablet Take 650 mg by mouth every 6 (six) hours  as needed for mild pain or headache.     alclomethasone (ACLOVATE) 0.05 % ointment Apply 1 application topically 2 (two) times daily as needed (for rash).     carbamazepine (TEGRETOL) 200 MG tablet Take 1 tablet (200 mg total) by mouth 3 (three) times daily. 90 tablet 0   carbidopa-levodopa (SINEMET IR) 25-100 MG tablet TAKE 2 TABLETS BY MOUTH THREE TIMES DAILY, AT 7 AM, 11 AM, AND 4 PM 540 tablet 0   cholecalciferol (VITAMIN D) 1000 UNITS tablet  Take 2,000 Units by mouth daily.     clopidogrel (PLAVIX) 75 MG tablet Take 1 tablet (75 mg total) by mouth daily. 90 tablet 3   Coenzyme Q10 (CO Q 10 PO) Take 1 capsule by mouth daily.     ferrous sulfate 324 (65 Fe) MG TBEC Take 324 mg by mouth daily.     Flaxseed, Linseed, (FLAX SEED OIL) 1300 MG CAPS Take 1 capsule by mouth daily.     FLUoxetine (PROZAC) 10 MG capsule Take 1 capsule (10 mg total) by mouth daily. 90 capsule 1   Fluticasone-Umeclidin-Vilant (TRELEGY ELLIPTA) 100-62.5-25 MCG/ACT AEPB Inhale 1 puff into the lungs daily. 1 each 11   levothyroxine (LEVOTHROID) 125 MCG tablet Take 1 tablet (125 mcg total) by mouth daily before breakfast. 90 tablet 3   MAGNESIUM PO Take 1 capsule by mouth daily.     Multiple Vitamin (MULITIVITAMIN WITH MINERALS) TABS Take 1 tablet by mouth daily.     Omega-3 Fatty Acids (OMEGA-3 CF PO) Take 1,200 mg by mouth daily.     POTASSIUM GLUCONATE PO Take 1 capsule by mouth daily.     pravastatin (PRAVACHOL) 80 MG tablet TAKE 1 TABLET(80 MG) BY MOUTH DAILY 90 tablet 1   QUEtiapine (SEROQUEL XR) 50 MG TB24 24 hr tablet Take 1 tablet (50 mg total) by mouth at bedtime. 90 tablet 1   tamsulosin (FLOMAX) 0.4 MG CAPS capsule Take 0.4 mg by mouth at bedtime.     No current facility-administered medications for this visit.    Medication Side Effects: None  Allergies: No Known Allergies  Past Medical History:  Diagnosis Date   Bipolar 1 disorder (HCC)    COPD (chronic obstructive pulmonary disease) (HCC)    Depression    First degree AV block    Hypertension    pt denies but was in medical record   Hypoglycemia, unspecified    Myalgia and myositis, unspecified    Obesity    Pneumonia 1970   Skin cancer (melanoma) (HCC)    Thyroid disease    Unspecified hypothyroidism    UPJ obstruction, congenital     Past Medical History, Surgical history, Social history, and Family history were reviewed and updated as appropriate.   Please see review of  systems for further details on the patient's review from today.   Objective:   Physical Exam:  Wt 190 lb (86.2 kg)   BMI 23.13 kg/m   Physical Exam Constitutional:      General: He is not in acute distress. Musculoskeletal:        General: No deformity.     Comments: Ambulates with cane  Neurological:     Mental Status: He is alert and oriented to person, place, and time.     Coordination: Coordination normal.  Psychiatric:        Attention and Perception: Attention and perception normal. He does not perceive auditory or visual hallucinations.        Mood and Affect: Mood  normal. Mood is not anxious or depressed. Affect is not labile, blunt, angry or inappropriate.        Speech: Speech normal.        Behavior: Behavior normal.        Thought Content: Thought content normal. Thought content is not paranoid or delusional. Thought content does not include homicidal or suicidal ideation. Thought content does not include homicidal or suicidal plan.        Cognition and Memory: Cognition and memory normal.        Judgment: Judgment normal.     Comments: Insight intact     Lab Review:     Component Value Date/Time   NA 146 (H) 09/12/2022 0000   K 4.7 09/12/2022 0000   CL 108 (H) 09/12/2022 0000   CO2 23 09/12/2022 0000   GLUCOSE 79 09/12/2022 0000   GLUCOSE 110 (H) 03/14/2022 2040   BUN 27 09/12/2022 0000   CREATININE 1.14 09/12/2022 0000   CREATININE 1.18 08/14/2020 1112   CALCIUM 9.0 09/12/2022 0000   PROT 6.8 03/14/2022 2040   PROT 6.2 01/16/2019 0838   ALBUMIN 3.9 03/14/2022 2040   ALBUMIN 4.3 01/16/2019 0838   AST 20 03/14/2022 2040   ALT <5 03/14/2022 2040   ALKPHOS 81 03/14/2022 2040   BILITOT 0.7 03/14/2022 2040   BILITOT 0.3 01/16/2019 0838   GFRNONAA >60 03/15/2022 0311   GFRAA 61 01/16/2019 0838       Component Value Date/Time   WBC 5.6 09/12/2022 0000   WBC 5.6 03/15/2022 0312   RBC 4.02 (L) 09/12/2022 0000   RBC 3.79 (L) 03/15/2022 0312   HGB 13.0  09/12/2022 0000   HCT 39.9 09/12/2022 0000   PLT 166 09/12/2022 0000   MCV 99 (H) 09/12/2022 0000   MCH 32.3 09/12/2022 0000   MCH 33.0 03/15/2022 0312   MCHC 32.6 09/12/2022 0000   MCHC 33.6 03/15/2022 0312   RDW 12.7 09/12/2022 0000   LYMPHSABS 0.9 03/14/2022 2040   LYMPHSABS 0.6 (L) 01/16/2019 0838   MONOABS 0.6 03/14/2022 2040   EOSABS 0.1 03/14/2022 2040   EOSABS 0.3 01/16/2019 0838   BASOSABS 0.1 03/14/2022 2040   BASOSABS 0.1 01/16/2019 0838    No results found for: "POCLITH", "LITHIUM"   Lab Results  Component Value Date   CBMZ 5.0 03/15/2022     .res Assessment: Plan:   35 minutes spent dedicated to the care of this patient on the date of this encounter to include pre-visit review of records, ordering of medication, post visit documentation, and face-to-face time with the patient discussing recent symptoms and treatment options. Pt reports some mild paranoia upon awakening that resolves within 30 minutes or less. He reports that paranoia is not causing significant distress at this time and he is usually able to easily dismiss paranoia. Discussed considering increase in Seroquel if paranoia were to worsen in the future and discussed risks versus benefits of Seroquel with Parkinson's disease and that Seroquel could potentially exacerbate Parkinson's symptoms. Pt reports that he would like to continue current dose of Seroquel at this time and he will contact office if paranoia worsens or becomes distressing in the future. Will continue Seroquel XR 50 mg at bedtime for paranoia and mood stabilization.  Will continue Carbamazepine 200 mg TID for mood stabilization. Reviewed labs and noted that RBC's were slightly low. Discussed that anemia can be a rare side effect of Carbamazepine. Advised pt to contact provider if RBC's decrease further or do not  improve. Continue Prozac 10 mg daily for anxiety and depression.  Pt to follow-up in 6 months or sooner if clinically indicated.   Patient advised to contact office with any questions, adverse effects, or acute worsening in signs and symptoms.   Martina Gibby" was seen today for follow-up.  Diagnoses and all orders for this visit:  Bipolar I disorder, current or most recent episode depressed, in partial remission (HCC) -     carbamazepine (TEGRETOL) 200 MG tablet; Take 1 tablet (200 mg total) by mouth 3 (three) times daily. -     FLUoxetine (PROZAC) 10 MG capsule; Take 1 capsule (10 mg total) by mouth daily. -     QUEtiapine (SEROQUEL XR) 50 MG TB24 24 hr tablet; Take 1 tablet (50 mg total) by mouth at bedtime.  Chronic post-traumatic stress disorder -     FLUoxetine (PROZAC) 10 MG capsule; Take 1 capsule (10 mg total) by mouth daily.     Please see After Visit Summary for patient specific instructions.  Future Appointments  Date Time Provider Department Center  01/02/2023  7:20 AM [facility] DEVICE REMOTES [facility]  01/12/2023 11:30 AM Marinus Maw, MD [facility]  03/03/2023  2:30 PM Tat, Octaviano Batty, DO [facility] None  04/03/2023  7:20 AM [facility] DEVICE REMOTES [facility]  05/09/2023 11:00 AM Corie Chiquito, PMHNP [facility] None  07/03/2023  7:20 AM [facility] DEVICE REMOTES [facility]  10/02/2023  7:20 AM [facility] DEVICE REMOTES [facility]  01/01/2024  7:20 AM [facility] DEVICE REMOTES [facility]  04/01/2024  7:20 AM [facility] DEVICE REMOTES [facility]  07/01/2024  7:20 AM [facility] DEVICE REMOTES [facility]  09/30/2024  7:20 AM [facility] DEVICE REMOTES [facility]    No orders of the defined types were placed in this encounter.   -------------------------------

## 2022-11-30 ENCOUNTER — Encounter: Payer: Self-pay | Admitting: Psychiatry

## 2022-11-30 NOTE — Telephone Encounter (Signed)
VP alert has been disabled in [place].

## 2022-12-08 ENCOUNTER — Telehealth: Payer: Self-pay | Admitting: Psychiatry

## 2022-12-08 DIAGNOSIS — F3175 Bipolar disorder, in partial remission, most recent episode depressed: Secondary | ICD-10-CM

## 2022-12-08 MED ORDER — CARBAMAZEPINE 200 MG PO TABS: 200.0000 mg | ORAL_TABLET | Freq: Three times a day (TID) | ORAL | 2 refills | Status: DC

## 2022-12-08 NOTE — Telephone Encounter (Signed)
Rx sent to requested pharmacy, 30-day supply with 2 RF.

## 2022-12-08 NOTE — Telephone Encounter (Signed)
Bill called at 1:36 to request refill of  his carbamazepine.  Appt 05/09/23.  Send to  [facility] #[id number] - [place], [place] - [address] AT [facility] [address]

## 2023-01-02 ENCOUNTER — Ambulatory Visit (INDEPENDENT_AMBULATORY_CARE_PROVIDER_SITE_OTHER): Payer: Medicare Other

## 2023-01-02 DIAGNOSIS — I442 Atrioventricular block, complete: Secondary | ICD-10-CM | POA: Diagnosis not present

## 2023-01-02 LAB — CUP PACEART REMOTE DEVICE CHECK
Battery Remaining Longevity: 144 mo
Battery Remaining Percentage: 100 %
Brady Statistic RA Percent Paced: 90 %
Brady Statistic RV Percent Paced: 100 %
Date Time Interrogation Session: 20241216004000
Implantable Lead Connection Status: 753985
Implantable Lead Connection Status: 753985
Implantable Lead Implant Date: 20150603
Implantable Lead Implant Date: 20150603
Implantable Lead Location: 753859
Implantable Lead Location: 753860
Implantable Lead Model: 4136
Implantable Lead Model: 4137
Implantable Lead Serial Number: 29477746
Implantable Lead Serial Number: 29617326
Implantable Pulse Generator Implant Date: 20240911
Lead Channel Impedance Value: 551 Ohm
Lead Channel Impedance Value: 716 Ohm
Lead Channel Pacing Threshold Amplitude: 0.4 V
Lead Channel Pacing Threshold Amplitude: 2.3 V
Lead Channel Pacing Threshold Pulse Width: 0.4 ms
Lead Channel Pacing Threshold Pulse Width: 0.4 ms
Lead Channel Setting Pacing Amplitude: 2.2 V
Lead Channel Setting Pacing Amplitude: 2.5 V
Lead Channel Setting Pacing Pulse Width: 0.4 ms
Lead Channel Setting Sensing Sensitivity: 2.5 mV
Pulse Gen Serial Number: 664123
Zone Setting Status: 755011

## 2023-01-08 ENCOUNTER — Other Ambulatory Visit: Payer: Self-pay | Admitting: Neurology

## 2023-01-08 DIAGNOSIS — G20A1 Parkinson's disease without dyskinesia, without mention of fluctuations: Secondary | ICD-10-CM

## 2023-01-12 ENCOUNTER — Encounter: Payer: Self-pay | Admitting: Internal Medicine

## 2023-01-12 ENCOUNTER — Ambulatory Visit: Payer: Medicare Other | Attending: Internal Medicine | Admitting: Internal Medicine

## 2023-01-12 VITALS — BP 130/68 | HR 60 | Ht 76.0 in | Wt 188.7 lb

## 2023-01-12 DIAGNOSIS — I495 Sick sinus syndrome: Secondary | ICD-10-CM | POA: Insufficient documentation

## 2023-01-12 DIAGNOSIS — Z950 Presence of cardiac pacemaker: Secondary | ICD-10-CM | POA: Diagnosis present

## 2023-01-12 DIAGNOSIS — I442 Atrioventricular block, complete: Secondary | ICD-10-CM | POA: Insufficient documentation

## 2023-01-12 NOTE — Progress Notes (Signed)
HPI Mr. Parkhouse returns today for ongoing evaluation and management of his permanent pacemaker status post insertion over 10 years ago with a gen change out 3 months ago. The patient is a very pleasant 89 year old man who has symptomatic sinus node dysfunction. He has done well in the interim. He denies chest pain or shortness of breath. No syncope. No peripheral edema.  No Known Allergies   Current Outpatient Medications  Medication Sig Dispense Refill   acetaminophen (TYLENOL) 325 MG tablet Take 650 mg by mouth every 6 (six) hours as needed for mild pain or headache.     alclomethasone (ACLOVATE) 0.05 % ointment Apply 1 application topically 2 (two) times daily as needed (for rash).     carbamazepine (TEGRETOL) 200 MG tablet Take 1 tablet (200 mg total) by mouth 3 (three) times daily. 90 tablet 2   carbidopa-levodopa (SINEMET IR) 25-100 MG tablet TAKE 2 TABLETS BY MOUTH THREE TIMES DAILY 540 tablet 0   cholecalciferol (VITAMIN D) 1000 UNITS tablet Take 2,000 Units by mouth daily.     clopidogrel (PLAVIX) 75 MG tablet Take 1 tablet (75 mg total) by mouth daily. 90 tablet 3   Coenzyme Q10 (CO Q 10 PO) Take 1 capsule by mouth daily.     ferrous sulfate 324 (65 Fe) MG TBEC Take 324 mg by mouth daily.     Flaxseed, Linseed, (FLAX SEED OIL) 1300 MG CAPS Take 1 capsule by mouth daily.     FLUoxetine (PROZAC) 10 MG capsule Take 1 capsule (10 mg total) by mouth daily. 90 capsule 1   Fluticasone-Umeclidin-Vilant (TRELEGY ELLIPTA) 100-62.5-25 MCG/ACT AEPB Inhale 1 puff into the lungs daily. 1 each 11   levothyroxine (LEVOTHROID) 125 MCG tablet Take 1 tablet (125 mcg total) by mouth daily before breakfast. 90 tablet 3   MAGNESIUM PO Take 1 capsule by mouth daily.     Multiple Vitamin (MULITIVITAMIN WITH MINERALS) TABS Take 1 tablet by mouth daily.     Omega-3 Fatty Acids (OMEGA-3 CF PO) Take 1,200 mg by mouth daily.     POTASSIUM GLUCONATE PO Take 1 capsule by mouth daily.     pravastatin  (PRAVACHOL) 80 MG tablet TAKE 1 TABLET(80 MG) BY MOUTH DAILY 90 tablet 1   QUEtiapine (SEROQUEL XR) 50 MG TB24 24 hr tablet Take 1 tablet (50 mg total) by mouth at bedtime. 90 tablet 1   tamsulosin (FLOMAX) 0.4 MG CAPS capsule Take 0.4 mg by mouth at bedtime.     No current facility-administered medications for this visit.     Past Medical History:  Diagnosis Date   Bipolar 1 disorder (HCC)    COPD (chronic obstructive pulmonary disease) (HCC)    Depression    First degree AV block    Hypertension    pt denies but was in medical record   Hypoglycemia, unspecified    Myalgia and myositis, unspecified    Obesity    Pneumonia 1970   Skin cancer (melanoma) (HCC)    Thyroid disease    Unspecified hypothyroidism    UPJ obstruction, congenital     ROS:   All systems reviewed and negative except as noted in the HPI.   Past Surgical History:  Procedure Laterality Date   APPENDECTOMY  1939   malignant melanoma removed from right forehead  2008   PERMANENT PACEMAKER INSERTION N/A 06/19/2013   Procedure: PERMANENT PACEMAKER INSERTION;  Surgeon: Marinus Maw, MD;  Location: [facility] CATH LAB;  Service: Cardiovascular;  Laterality:  N/A;   PPM GENERATOR CHANGEOUT N/A 09/28/2022   Procedure: PPM GENERATOR CHANGEOUT;  Surgeon: Marinus Maw, MD;  Location: [facility] INVASIVE CV LAB;  Service: Cardiovascular;  Laterality: N/A;   right big toe  surgery  2009   TRANSURETHRAL RESECTION OF PROSTATE  02/04/2011   Procedure: TRANSURETHRAL RESECTION OF THE PROSTATE (TURP);  Surgeon: Crecencio Mc, MD;  Location: [facility] ORS;  Service: Urology;  Laterality: N/A;   URETHRA SURGERY  2010   reconstruction     Family History  Problem Relation Age of Onset   Cerebral aneurysm Mother    Lung cancer Father    Drug abuse Daughter      Social History   Socioeconomic History   Marital status: Widowed    Spouse name: Not on file   Number of children: Not on file   Years of education: Not on file   Highest  education level: Not on file  Occupational History   Occupation: retired  Tobacco Use   Smoking status: Former    Current packs/day: 2.00    Average packs/day: 2.0 packs/day for 40.0 years (80.0 ttl pk-yrs)    Types: Cigarettes   Smokeless tobacco: Never  Vaping Use   Vaping status: Never Used  Substance and Sexual Activity   Alcohol use: No   Drug use: No   Sexual activity: Not Currently  Other Topics Concern   Not on file  Social History Narrative   Work or School: retire Emergency planning/management officer      Home Situation: lives with wife      Spiritual Beliefs: episcopalian      Lifestyle: active with yard work; diet ok   Social Drivers of Corporate investment banker Strain: Low Risk  (05/14/2021)   Overall Financial Resource Strain (CARDIA)    Difficulty of Paying Living Expenses: Not hard at all  Food Insecurity: No Food Insecurity (03/18/2022)   Hunger Vital Sign    Worried About Running Out of Food in the Last Year: Never true    Ran Out of Food in the Last Year: Never true  Transportation Needs: No Transportation Needs (03/18/2022)   PRAPARE - Administrator, Civil Service (Medical): No    Lack of Transportation (Non-Medical): No  Physical Activity: Sufficiently Active (05/14/2021)   Exercise Vital Sign    Days of Exercise per Week: 5 days    Minutes of Exercise per Session: 30 min  Stress: No Stress Concern Present (05/14/2021)   [employer] of Occupational Health - Occupational Stress Questionnaire    Feeling of Stress : Not at all  Social Connections: Moderately Integrated (05/14/2021)   Social Connection and Isolation Panel [NHANES]    Frequency of Communication with Friends and Family: More than three times a week    Frequency of Social Gatherings with Friends and Family: More than three times a week    Attends Religious Services: More than 4 times per year    Active Member of [employer] or Organizations: Yes    Attends Banker Meetings: More than 4  times per year    Marital Status: Widowed  Intimate Partner Violence: Not At Risk (03/15/2022)   Humiliation, Afraid, Rape, and Kick questionnaire    Fear of Current or Ex-Partner: No    Emotionally Abused: No    Physically Abused: No    Sexually Abused: No     BP 130/68 (BP Location: Left Arm, Patient Position: Sitting, Cuff Size: Large)   Pulse 60  Ht 6\' 4"  (1.93 m)   Wt 188 lb 11.2 oz (85.6 kg)   SpO2 96%   BMI 22.97 kg/m   Physical Exam:  Well appearing elderly man, NAD HEENT: Unremarkable Neck:  No JVD, no thyromegally Lymphatics:  No adenopathy Back:  No CVA tenderness Lungs:  Clear with no wheezes HEART:  Regular rate rhythm, no murmurs, no rubs, no clicks Abd:  soft, positive bowel sounds, no organomegally, no rebound, no guarding Ext:  2 plus pulses, no edema, no cyanosis, no clubbing Skin:  No rashes no nodules Neuro:  CN II through XII intact, motor grossly intact  EKG - nsr with AV pacing  DEVICE  Normal device function.  See PaceArt for details.   Assess/Plan:  1. CHB/sinus node dysfunction - he has both and is pacing much of the time. He is asymptomatic. 2. PPM -his [employer] DDD PM is working normally. 3. HTN - his bp is well controlled.  4. Dyslipidemia - he will continue his statin therapy with pravachol.   Sharlot Gowda General Wearing,MD

## 2023-01-12 NOTE — Patient Instructions (Signed)
Medication Instructions:  Your physician recommends that you continue on your current medications as directed. Please refer to the Current Medication list given to you today.  *If you need a refill on your cardiac medications before your next appointment, please call your pharmacy*  Lab Work: None ordered.  If you have labs (blood work) drawn today and your tests are completely normal, you will receive your results only by: MyChart Message (if you have MyChart) OR A paper copy in the mail If you have any lab test that is abnormal or we need to change your treatment, we will call you to review the results.  Testing/Procedures: None ordered.  Follow-Up: At [facility], you and your health needs are our priority.  As part of our continuing mission to provide you with exceptional heart care, we have created designated Provider Care Teams.  These Care Teams include your primary Cardiologist (physician) and Advanced Practice Providers (APPs -  Physician Assistants and Nurse Practitioners) who all work together to provide you with the care you need, when you need it.   Your next appointment:   1 year(s)  The format for your next appointment:   In Person  Provider:   Casimiro Needle "Mardelle Matte" Lanna Poche, PA-C    Important Information About Sugar

## 2023-01-18 ENCOUNTER — Other Ambulatory Visit: Payer: Self-pay | Admitting: Family

## 2023-01-25 ENCOUNTER — Other Ambulatory Visit: Payer: Self-pay | Admitting: Family

## 2023-01-26 NOTE — Telephone Encounter (Signed)
 Copied from CRM [id number]. Topic: Clinical - Medication Refill >> Jan 25, 2023 12:21 PM Merlynn LABOR wrote: Most Recent Primary Care Visit:  Provider: JASON DOFFING [facility]  Department: [facility]  Visit Type: OFFICE VISIT  Date: 07/26/2022  Medication: clopidogrel  (PLAVIX ) 75 MG tablet  Has the patient contacted their pharmacy? No (Agent: If no, request that the patient contact the pharmacy for the refill. If patient does not wish to contact the pharmacy document the reason why and proceed with request.) (Agent: If yes, when and what did the pharmacy advise?)  Patient did not contact pharmacy. Patient was advised that request was denied. There is no request for this medication. Please process refill for patient if possible.   Is this the correct pharmacy for this prescription? Yes If no, delete pharmacy and type the correct one.  This is the patient's preferred pharmacy:   [facility] #[id number] - [place], [place] - [address] AT [facility] [address] [place] [address] Phone: [phone] Fax: [phone]   Has the prescription been filled recently? No  Is the patient out of the medication? Yes  Has the patient been seen for an appointment in the last year OR does the patient have an upcoming appointment? No  Can we respond through MyChart? Yes  Agent: Please be advised that Rx refills may take up to 3 business days. We ask that you follow-up with your pharmacy.

## 2023-01-27 ENCOUNTER — Telehealth: Payer: Self-pay

## 2023-01-27 MED ORDER — CLOPIDOGREL BISULFATE 75 MG PO TABS: 75.0000 mg | ORAL_TABLET | Freq: Every day | ORAL | 0 refills | Status: DC

## 2023-01-27 NOTE — Telephone Encounter (Signed)
 Copied from CRM [id number]. Topic: Clinical - Prescription Issue >> Jan 27, 2023 10:30 AM Corean SAUNDERS wrote: Reason for CRM: Patient is requesting Karilyn Elbe to please send his clopidogrel  (PLAVIX ) 75 MG tablet to  [facility] #[id number] - [place], [place] - [address] AT [facility] [address] [place] [address] // Phone: [phone] Fax: [phone] Patient is out of this medication, he still has re-fills but those are at the wrong pharmacy.

## 2023-01-27 NOTE — Telephone Encounter (Signed)
 Rx sent

## 2023-02-06 NOTE — Progress Notes (Signed)
Remote pacemaker transmission.   

## 2023-02-15 ENCOUNTER — Other Ambulatory Visit: Payer: Self-pay

## 2023-02-15 DIAGNOSIS — F3175 Bipolar disorder, in partial remission, most recent episode depressed: Secondary | ICD-10-CM

## 2023-02-15 MED ORDER — QUETIAPINE FUMARATE ER 50 MG PO TB24: 50.0000 mg | ORAL_TABLET | Freq: Every day | ORAL | 1 refills | Status: DC

## 2023-02-20 NOTE — Therapy (Signed)
 OUTPATIENT PHYSICAL THERAPY PARKINSON'S DISEASE SCREEN   Patient Name: Victor Osborne MRN: [id number] DOB:1932-05-11, 89 y.o., male Today's Date: 02/21/2023   PT End of Session - 02/21/23 1140     Visit Number 1   PD screen   Authorization Type Medicare & [employer]    PT Start Time 1110    PT Stop Time 1141    PT Time Calculation (min) 31 min    Activity Tolerance Patient tolerated treatment well    Behavior During Therapy WFL for tasks assessed/performed             Past Medical History:  Diagnosis Date   Bipolar 1 disorder (HCC)    COPD (chronic obstructive pulmonary disease) (HCC)    Depression    First degree AV block    Hypertension    pt denies but was in medical record   Hypoglycemia, unspecified    Myalgia and myositis, unspecified    Obesity    Pneumonia 1970   Skin cancer (melanoma) (HCC)    Thyroid  disease    Unspecified hypothyroidism    UPJ obstruction, congenital    Past Surgical History:  Procedure Laterality Date   APPENDECTOMY  1939   malignant melanoma removed from right forehead  2008   PERMANENT PACEMAKER INSERTION N/A 06/19/2013   Procedure: PERMANENT PACEMAKER INSERTION;  Surgeon: Danelle LELON Birmingham, MD;  Location: [facility] CATH LAB;  Service: Cardiovascular;  Laterality: N/A;   PPM GENERATOR CHANGEOUT N/A 09/28/2022   Procedure: PPM GENERATOR CHANGEOUT;  Surgeon: Birmingham Danelle LELON, MD;  Location: [facility] INVASIVE CV LAB;  Service: Cardiovascular;  Laterality: N/A;   right big toe  surgery  2009   TRANSURETHRAL RESECTION OF PROSTATE  02/04/2011   Procedure: TRANSURETHRAL RESECTION OF THE PROSTATE (TURP);  Surgeon: Noretta Ferrara, MD;  Location: [facility] ORS;  Service: Urology;  Laterality: N/A;   URETHRA SURGERY  2010   reconstruction   Patient Active Problem List   Diagnosis Date Noted   Hyperlipidemia 03/15/2022   TIA (transient ischemic attack) 03/14/2022   History of neoplasm 04/12/2021   Melanocytic nevi of trunk 04/12/2021   Other seborrheic  keratosis 04/12/2021   Cerebral embolism with cerebral infarction 03/31/2021   Left leg weakness 03/30/2021   Chronic post-traumatic stress disorder 12/05/2017   Bipolar I disorder, current or most recent episode depressed, in partial remission (HCC) 12/05/2017   CHB (complete heart block) (HCC) 01/27/2017   HTN (hypertension) 01/27/2017   Hypothyroidism 10/11/2016   Parkinson disease (HCC) 09/08/2016   Chronic osteoarthritis 03/20/2015   Bipolar 1 disorder (HCC) 03/20/2015   Tremor 03/20/2015   Pacemaker 09/24/2013   SSS (sick sinus syndrome) (HCC) 06/19/2013   First degree AV block 04/09/2013   Allergic rhinitis 03/17/2013   Obstructive sleep apnea 12/18/2006   COPD mixed type (HCC) 12/18/2006   History of malignant melanoma of skin 12/18/2006   Diabetes mellitus (HCC) 12/18/2006    PCP: Jason Leita Repine, FNP  REFERRING PROVIDER: Evonnie Asberry RAMAN, DO   REFERRING DIAG: G20.A1 (ICD-10-CM) - Parkinson's disease without dyskinesia or fluctuating manifestations   RATIONALE FOR EVALUATION AND TREATMENT: Rehabilitation  THERAPY DIAG:  Other abnormalities of gait and mobility  Unsteadiness on feet  Muscle weakness (generalized)  Abnormal posture  ONSET DATE: Initial PD diagnosis >15 yrs ago   MOST RECENT PT EPISODE FOR PD: 06/07/22 - 09/07/22  NEXT MD VISIT: 03/03/23   SUBJECTIVE:  SUBJECTIVE STATEMENT: Pt reports it has been a long time since he has seen Dr. Evonnie.  He notes he is starting feels the effects of the Parkinson's more - dizziness upon coming to standing and feeling weaker.  Standing for ADLs cause him more fatigue than they used to, requiring seated rest breaks.  He notes increased difficulty with UB dressing when trying to don a jacket.  Walking requires more effort and he tires  more easily.  Feels more unsteady on his feet.   Pt accompanied by: self  PAIN:  Are you having pain? No  PERTINENT HISTORY:  PPM 2 sick sinus syndrome/SA node dysfunction & complete heart block, orthostatic hypotension, COPD, OSA, HTN, DM2, Parkinson's Disease, CVA 03/2021, hypothyroidism, PTSD, bipolar I disorder   PRECAUTIONS: ICD/Pacemaker   WEIGHT BEARING RESTRICTIONS: No  FALLS:  Has patient fallen in last 6 months? No  LIVING ENVIRONMENT: Lives with: lives alone and with his dog Lives in: Other Independent living facility (3rd floor) Stairs:  Elevator Has following equipment at home: Single point cane, Environmental Consultant - 4 wheeled, and hiking pole  OCCUPATION: Retired  PLOF: Independent and Leisure: being outdoors - walking his dog, and stays active most of the day  PATIENT GOALS: To get better strength in my extremities.     OBJECTIVE:   PHYSICAL THERAPY PARKINSON'S DISEASE SCREEN  TUG - Timed Up and Go test:  10.78 sec (Normal), 10.09 sec (Manual), 11.09 sec (Cognitive - unable to complete cognitive task w/o multiple errors) Baseline as of D/C from last PT episode:  Normal = 8.34 sec w/o AD; Manual = 9.16 sec; Cognitive = 14.06 sec  10 meter walk test: 9.00 sec w/o AD; Gait speed = 3.64 ft/sec w/o AD Baseline as of D/C from last PT episode:  6.44 sec w/o AD; Gait speed: 5.09 ft/sec w/o AD  5 time sit to stand test: 12.19 sec w/o UE assist (poor eccentric control) Baseline as of D/C from last PT episode: 14.06 sec w/o UE assist   PATIENT EDUCATION:  Education details: PT eval findings and recommendation to request orders for PT to eval and treat Person educated: Patient Education method: Explanation Education comprehension: verbalized understanding   ASSESSMENT / PLAN:  CLINICAL IMPRESSION: Victor Osborne is a 89 y.o. male who was seen today for a 6 month physical therapy screen for Parkinson's disease.  He notes feeling that he is slowing down with  decreasing tolerance for ADLs and and awareness of increasing weakness in his extremities.  His gait demonstrates increased flexed posture with reduced reciprocal arm swing.  Screening testing revealing increased time necessary for normal and manual TUGs as well as poor eccentric control on 5xSTS and decreased gait speed.   PD Screen Recommendations: Patient would benefit from Physical Therapy evaluation due to declining function as described above.    Elijah CHRISTELLA Hidden, PT 02/21/2023, 11:42 AM

## 2023-02-21 ENCOUNTER — Ambulatory Visit: Payer: Medicare Other | Attending: Neurology | Admitting: Physical Therapy

## 2023-02-21 DIAGNOSIS — R2689 Other abnormalities of gait and mobility: Secondary | ICD-10-CM | POA: Insufficient documentation

## 2023-02-21 DIAGNOSIS — R293 Abnormal posture: Secondary | ICD-10-CM | POA: Insufficient documentation

## 2023-02-21 DIAGNOSIS — R2681 Unsteadiness on feet: Secondary | ICD-10-CM | POA: Insufficient documentation

## 2023-02-21 DIAGNOSIS — M6281 Muscle weakness (generalized): Secondary | ICD-10-CM | POA: Insufficient documentation

## 2023-03-02 NOTE — Progress Notes (Unsigned)
Assessment/Plan:   1.  Parkinsons Disease  -Continue carbidopa/levodopa 25/100, 2 tablets 3 times per day  -Had a physical therapy referral about 2 weeks ago and they did recommend he start physical therapy.  Therapy prescription will be sent to [facility].   2.  History of well-controlled bipolar  -Follows with psychiatry.  On Tegretol 200 mg tid and Seroquel XR, 50 mg at bedtime  3.  History of melanoma  -Follows with Dr. Emily Filbert  -Knows that Parkinson's slightly increases risk for future melanomas as well.  4.  Possible ACA infarct March, 2023 and another possible TIA in February, 2024  -Could not be confirmed because patients pacemaker is not MRI compatible.  -CT did not demonstrate acute infarct on either admission  -Patient has completed his 3 weeks of Plavix/aspirin together.  Will now go to Plavix monotherapy.  Nurse practitioner refilled his Plavix for 1 year.    -Now on Pravachol, 80 mg daily  5.  Orthostatic hypotension  -Unrelated to Parkinson's.  His Parkinson's is far too mild, and really has not shown significant progression over the years.  -Patient off of midodrine and doing well.    Subjective:   Victor Osborne was seen today in follow up for Parkinsons disease and more recent hospital f/u for possible stroke and orthostasis. My previous records were reviewed prior to todays visit as well as outside records available to me.  I have not seen the patient in almost a year.  He had an appointment last October, but canceled that.  Notes are reviewed since our last visit.  He just started up physical therapy again and had a screening and PT was recommended.  Saw Dr. Ladona Ridgel recently from cardiology and he is doing well from that regard.  Current prescribed movement disorder medications: Carbidopa/levodopa 25/100, 2 tablets 3 times per day Pravachol, 80 mg daily  ALLERGIES:  No Known Allergies  CURRENT MEDICATIONS:  Outpatient Encounter Medications as of  03/03/2023  Medication Sig   acetaminophen (TYLENOL) 325 MG tablet Take 650 mg by mouth every 6 (six) hours as needed for mild pain or headache.   alclomethasone (ACLOVATE) 0.05 % ointment Apply 1 application topically 2 (two) times daily as needed (for rash).   carbamazepine (TEGRETOL) 200 MG tablet Take 1 tablet (200 mg total) by mouth 3 (three) times daily.   carbidopa-levodopa (SINEMET IR) 25-100 MG tablet TAKE 2 TABLETS BY MOUTH THREE TIMES DAILY   cholecalciferol (VITAMIN D) 1000 UNITS tablet Take 2,000 Units by mouth daily.   clopidogrel (PLAVIX) 75 MG tablet Take 1 tablet (75 mg total) by mouth daily.   Coenzyme Q10 (CO Q 10 PO) Take 1 capsule by mouth daily.   ferrous sulfate 324 (65 Fe) MG TBEC Take 324 mg by mouth daily.   Flaxseed, Linseed, (FLAX SEED OIL) 1300 MG CAPS Take 1 capsule by mouth daily.   FLUoxetine (PROZAC) 10 MG capsule Take 1 capsule (10 mg total) by mouth daily.   Fluticasone-Umeclidin-Vilant (TRELEGY ELLIPTA) 100-62.5-25 MCG/ACT AEPB Inhale 1 puff into the lungs daily.   levothyroxine (SYNTHROID) 125 MCG tablet TAKE 1 TABLET(125 MCG) BY MOUTH DAILY BEFORE BREAKFAST   MAGNESIUM PO Take 1 capsule by mouth daily.   Multiple Vitamin (MULITIVITAMIN WITH MINERALS) TABS Take 1 tablet by mouth daily.   Omega-3 Fatty Acids (OMEGA-3 CF PO) Take 1,200 mg by mouth daily.   POTASSIUM GLUCONATE PO Take 1 capsule by mouth daily.   pravastatin (PRAVACHOL) 80 MG tablet Take 1  tablet (80 mg total) by mouth daily.   QUEtiapine (SEROQUEL XR) 50 MG TB24 24 hr tablet Take 1 tablet (50 mg total) by mouth at bedtime.   tamsulosin (FLOMAX) 0.4 MG CAPS capsule Take 0.4 mg by mouth at bedtime.   No facility-administered encounter medications on file as of 03/03/2023.    Objective:   PHYSICAL EXAMINATION:    VITALS:   There were no vitals filed for this visit.    GEN:  The patient appears stated age and is in NAD.  He is cheerful and joking. HEENT:  Normocephalic, atraumatic.  The  mucous membranes are moist. The superficial temporal arteries are without ropiness or tenderness. CV:  RRR Lungs:  CTAB Neck/HEME:  There are no carotid bruits bilaterally.  Neurological examination:  Orientation: The patient is alert and oriented x3. Cranial nerves: There is good facial symmetry with minimal facial hypomimia. The speech is fluent and clear. Soft palate rises symmetrically and there is no tongue deviation. Hearing is intact to conversational tone. Sensation: Sensation is intact to light touch throughout Motor: Strength is at least antigravity x4.  Movement examination: Tone: there is mild increased tone in the RUE (has not taken his Parkinsons Disease med today) Abnormal movements: there is mild trouble with finger taps on the R Coordination:  There is  decremation with RAM's, only with toe taps on the L (same as last visit) Gait and Station: The patient pushes off to arise.  He ambulates well with the walker.  I have reviewed and interpreted the following labs independently    Chemistry      Component Value Date/Time   NA 146 (H) 09/12/2022 0000   K 4.7 09/12/2022 0000   CL 108 (H) 09/12/2022 0000   CO2 23 09/12/2022 0000   BUN 27 09/12/2022 0000   CREATININE 1.14 09/12/2022 0000   CREATININE 1.18 08/14/2020 1112      Component Value Date/Time   CALCIUM 9.0 09/12/2022 0000   ALKPHOS 81 03/14/2022 2040   AST 20 03/14/2022 2040   ALT <5 03/14/2022 2040   BILITOT 0.7 03/14/2022 2040   BILITOT 0.3 01/16/2019 0838       Lab Results  Component Value Date   WBC 5.6 09/12/2022   HGB 13.0 09/12/2022   HCT 39.9 09/12/2022   MCV 99 (H) 09/12/2022   PLT 166 09/12/2022    Lab Results  Component Value Date   TSH 4.142 03/15/2022   Lab Results  Component Value Date   CHOL 159 03/15/2022   HDL 75 03/15/2022   LDLCALC 72 03/15/2022   TRIG 59 03/15/2022   CHOLHDL 2.1 03/15/2022     Total time spent on today's visit was *** minutes, including both  face-to-face time and nonface-to-face time.  Time included that spent on review of records (prior notes available to me/labs/imaging if pertinent), discussing treatment and goals, answering patient's questions and coordinating care.   Cc:  Olive Bass, FNP

## 2023-03-03 ENCOUNTER — Encounter: Payer: Self-pay | Admitting: Neurology

## 2023-03-03 ENCOUNTER — Ambulatory Visit: Payer: Medicare Other | Admitting: Neurology

## 2023-03-03 ENCOUNTER — Other Ambulatory Visit: Payer: Medicare Other

## 2023-03-03 VITALS — BP 124/80 | HR 61 | Ht 76.0 in | Wt 196.6 lb

## 2023-03-03 DIAGNOSIS — G20A1 Parkinson's disease without dyskinesia, without mention of fluctuations: Secondary | ICD-10-CM

## 2023-03-03 DIAGNOSIS — Z5181 Encounter for therapeutic drug level monitoring: Secondary | ICD-10-CM

## 2023-03-03 DIAGNOSIS — G20B1 Parkinson's disease with dyskinesia, without mention of fluctuations: Secondary | ICD-10-CM | POA: Diagnosis not present

## 2023-03-03 NOTE — Patient Instructions (Addendum)
We discussed a nonprofit medical company called [employer].  They have all kinds of medical equipment including wheelchairs, walkers, canes and incontinence materials, which is free of charge.  Their phone number is [phone].  Their email is [email or web address].  Feel free to reach out to them with questions.  I believe that they are only open for pick up on Tuesdays but you can contact them other days of the week.   Your provider has requested that you have labwork completed today. The lab is located on the Second floor at [address], within the [facility] office. When you get off the elevator, turn right and go in the [facility] [address]; the first brown door on the left.  Tell the ladies behind the desk that you are there for lab work. If you are not called within 15 minutes please check with the front desk.   Once you complete your labs you are free to go. You will receive a call or message via MyChart with your lab results.

## 2023-03-04 LAB — COMPREHENSIVE METABOLIC PANEL
AG Ratio: 1.7 (calc) (ref 1.0–2.5)
ALT: 8 U/L — ABNORMAL LOW (ref 9–46)
AST: 15 U/L (ref 10–35)
Albumin: 4.3 g/dL (ref 3.6–5.1)
Alkaline phosphatase (APISO): 78 U/L (ref 35–144)
BUN: 25 mg/dL (ref 7–25)
CO2: 26 mmol/L (ref 20–32)
Calcium: 9.3 mg/dL (ref 8.6–10.3)
Chloride: 107 mmol/L (ref 98–110)
Creat: 1.18 mg/dL (ref 0.70–1.22)
Globulin: 2.5 g/dL (ref 1.9–3.7)
Glucose, Bld: 81 mg/dL (ref 65–99)
Potassium: 4.5 mmol/L (ref 3.5–5.3)
Sodium: 145 mmol/L (ref 135–146)
Total Bilirubin: 0.4 mg/dL (ref 0.2–1.2)
Total Protein: 6.8 g/dL (ref 6.1–8.1)

## 2023-03-04 LAB — CARBAMAZEPINE LEVEL, TOTAL: Carbamazepine Lvl: 8.2 mg/L (ref 4.0–12.0)

## 2023-03-04 LAB — CBC
HCT: 41 % (ref 38.5–50.0)
Hemoglobin: 13.7 g/dL (ref 13.2–17.1)
MCH: 32.3 pg (ref 27.0–33.0)
MCHC: 33.4 g/dL (ref 32.0–36.0)
MCV: 96.7 fL (ref 80.0–100.0)
MPV: 10.5 fL (ref 7.5–12.5)
Platelets: 203 10*3/uL (ref 140–400)
RBC: 4.24 10*6/uL (ref 4.20–5.80)
RDW: 12.7 % (ref 11.0–15.0)
WBC: 5.1 10*3/uL (ref 3.8–10.8)

## 2023-03-06 ENCOUNTER — Encounter: Payer: Self-pay | Admitting: Neurology

## 2023-03-10 ENCOUNTER — Telehealth: Payer: Self-pay | Admitting: Psychiatry

## 2023-03-10 DIAGNOSIS — F3175 Bipolar disorder, in partial remission, most recent episode depressed: Secondary | ICD-10-CM

## 2023-03-10 MED ORDER — CARBAMAZEPINE 200 MG PO TABS
200.0000 mg | ORAL_TABLET | Freq: Three times a day (TID) | ORAL | 1 refills | Status: DC
Start: 2023-03-10 — End: 2023-05-09

## 2023-03-10 NOTE — Telephone Encounter (Signed)
Pt called back to confirm that there's two [facility] on [address].  He wants it sent to   [id number] - [place], [place] - [address] AT [facility] [address], [place] [id number] Phone: [phone]  Fax: [phone]

## 2023-03-10 NOTE — Telephone Encounter (Signed)
Tegretol sent to [facility] #[id number].

## 2023-03-10 NOTE — Telephone Encounter (Signed)
PT called and said that he needs a refill on his carbamazepine 200 mg. Pharmacy is [facility] on [address] in [place]

## 2023-03-28 ENCOUNTER — Ambulatory Visit: Payer: Medicare Other | Attending: Neurology | Admitting: Physical Therapy

## 2023-03-28 DIAGNOSIS — R2689 Other abnormalities of gait and mobility: Secondary | ICD-10-CM | POA: Insufficient documentation

## 2023-03-28 DIAGNOSIS — R293 Abnormal posture: Secondary | ICD-10-CM | POA: Insufficient documentation

## 2023-03-28 DIAGNOSIS — M6281 Muscle weakness (generalized): Secondary | ICD-10-CM | POA: Insufficient documentation

## 2023-03-28 DIAGNOSIS — R2681 Unsteadiness on feet: Secondary | ICD-10-CM | POA: Insufficient documentation

## 2023-03-31 NOTE — Therapy (Addendum)
 OUTPATIENT PHYSICAL THERAPY PARKINSON'S EVALUATION   Patient Name: Victor Osborne MRN: [id number] DOB:02-Nov-1932, 89 y.o., male Today's Date: 04/06/2023   END OF SESSION:  PT End of Session - 04/06/23 1149     Visit Number 1    Date for PT Re-Evaluation 06/29/23    Authorization Type Medicare & [employer]    Progress Note Due on Visit 10    PT Start Time 1149    PT Stop Time 1246    PT Time Calculation (min) 57 min    Activity Tolerance Patient tolerated treatment well    Behavior During Therapy WFL for tasks assessed/performed             Past Medical History:  Diagnosis Date   Bipolar 1 disorder (HCC)    COPD (chronic obstructive pulmonary disease) (HCC)    Depression    First degree AV block    Hypertension    pt denies but was in medical record   Hypoglycemia, unspecified    Myalgia and myositis, unspecified    Obesity    Pneumonia 1970   Skin cancer (melanoma) (HCC)    Thyroid disease    Unspecified hypothyroidism    UPJ obstruction, congenital    Past Surgical History:  Procedure Laterality Date   APPENDECTOMY  1939   malignant melanoma removed from right forehead  2008   PERMANENT PACEMAKER INSERTION N/A 06/19/2013   Procedure: PERMANENT PACEMAKER INSERTION;  Surgeon: Marinus Maw, MD;  Location: [facility] CATH LAB;  Service: Cardiovascular;  Laterality: N/A;   PPM GENERATOR CHANGEOUT N/A 09/28/2022   Procedure: PPM GENERATOR CHANGEOUT;  Surgeon: Marinus Maw, MD;  Location: [facility] INVASIVE CV LAB;  Service: Cardiovascular;  Laterality: N/A;   right big toe  surgery  2009   TRANSURETHRAL RESECTION OF PROSTATE  02/04/2011   Procedure: TRANSURETHRAL RESECTION OF THE PROSTATE (TURP);  Surgeon: Crecencio Mc, MD;  Location: [facility] ORS;  Service: Urology;  Laterality: N/A;   URETHRA SURGERY  2010   reconstruction   Patient Active Problem List   Diagnosis Date Noted   Hyperlipidemia 03/15/2022   TIA (transient ischemic attack) 03/14/2022   History of neoplasm  04/12/2021   Melanocytic nevi of trunk 04/12/2021   Other seborrheic keratosis 04/12/2021   Cerebral embolism with cerebral infarction 03/31/2021   Left leg weakness 03/30/2021   Chronic post-traumatic stress disorder 12/05/2017   Bipolar I disorder, current or most recent episode depressed, in partial remission (HCC) 12/05/2017   CHB (complete heart block) (HCC) 01/27/2017   HTN (hypertension) 01/27/2017   Hypothyroidism 10/11/2016   Parkinson disease (HCC) 09/08/2016   Chronic osteoarthritis 03/20/2015   Bipolar 1 disorder (HCC) 03/20/2015   Tremor 03/20/2015   Pacemaker 09/24/2013   SSS (sick sinus syndrome) (HCC) 06/19/2013   First degree AV block 04/09/2013   Allergic rhinitis 03/17/2013   Obstructive sleep apnea 12/18/2006   COPD mixed type (HCC) 12/18/2006   History of malignant melanoma of skin 12/18/2006   Diabetes mellitus (HCC) 12/18/2006    PCP: Olive Bass, FNP   REFERRING PROVIDER: Vladimir Faster, DO   REFERRING DIAG: G20.A1 (ICD-10-CM) - Parkinson's disease without dyskinesia or fluctuating manifestations (HCC)   THERAPY DIAG:  Other abnormalities of gait and mobility  Unsteadiness on feet  Muscle weakness (generalized)  Abnormal posture  RATIONALE FOR EVALUATION AND TREATMENT: Rehabilitation  ONSET DATE: Initial PD diagnosis >15 yrs ago, worsening symptoms over past few months  NEXT MD VISIT: 08/24/2023   SUBJECTIVE:  SUBJECTIVE STATEMENT: Pt reports he feels that he needs to use the rollator more now than previously and notes more effort when tries to go without the walker. He would like to use a 4WW with bigger wheels - he found one that needed fixing up and he has been using this, but would prefer a new one.  He feels like his legs are getting  weaker and his activity tolerance is decreasing.  As of our PD screen on 02/21/23, He noted he was starting to feel the effects of the Parkinson's more - dizziness upon coming to standing and feeling weaker. Standing for ADLs cause him more fatigue than they used to, requiring seated rest breaks. He noted increased difficulty with UB dressing when trying to don a jacket. Walking requires more effort and he tires more easily. Feels more unsteady on his feet.   Pt accompanied by: self  PAIN: Are you having pain? Yes: NPRS scale: ~4/10 when getting up (depends on time of day) Pain location: B anterior thighs  Pain description: soreness  Aggravating factors: sit to stand transfers  Relieving factors: not indicated (resolves upon completion of motion)   PERTINENT HISTORY:  PPM 2 sick sinus syndrome/SA node dysfunction & complete heart block, orthostatic hypotension, COPD, OSA, HTN, DM2, Parkinson's Disease, CVA 03/2021, hypothyroidism, PTSD, bipolar I disorder   PRECAUTIONS: Fall and ICD/Pacemaker  RED FLAGS: None  WEIGHT BEARING RESTRICTIONS: No  FALLS:  Has patient fallen in last 6 months? No  LIVING ENVIRONMENT: Lives with: lives alone and with his dog Lives in: Other Independent living facility (3rd floor) Stairs:  Elevator Has following equipment at home: Single point cane, Environmental consultant - 4 wheeled, and hiking pole  OCCUPATION: Retired  PLOF: Independent and Leisure: being outdoors - walking his dog, and stays active most of the day  PATIENT GOALS: "To be better prepared for balance issues and be able to adjust to avoid falling."   OBJECTIVE: (objective measures completed at initial evaluation unless otherwise dated)  DIAGNOSTIC FINDINGS:  03/14/22 & 03/15/22 - CT Head w/o contrast:  IMPRESSION: 1. No evidence of acute intracranial abnormality. 2. Chronic small vessel ischemic disease and cerebral atrophy.  03/14/22 - CT Angio Head & Neck: IMPRESSION: 1. Negative CTA for large  vessel occlusion or other emergent finding. 2. Mild for age atheromatous change about the carotid bifurcations and carotid siphons without hemodynamically significant stenosis.  COGNITION: Overall cognitive status: Within functional limits for tasks assessed   SENSATION: WFL Except neuropathy in his feet - most notable at night  COORDINATION: Gross motor WFL B UE/LE   POSTURE:  rounded shoulders, forward head, and flexed trunk   MUSCLE LENGTH: Hamstrings: mod tight B ITB: mild/mod tight B Piriformis: mild/mod tight B Hip flexors: mod/severe tight B Quads: mod tight B Heelcord: mod tight L, mild tight R   LOWER EXTREMITY ROM:    Grossly WFL with exceptions of limitation related to muscle tightness as above  LOWER EXTREMITY MMT:    MMT Right eval Left eval  Hip flexion 4+ 4+  Hip extension 4 ^ 4 ^  Hip abduction 4- 4-  Hip adduction 4 4  Hip internal rotation 4+ 4+  Hip external rotation 4 4  Knee flexion 5 5  Knee extension 4+ 5  Ankle dorsiflexion 5 4+  Ankle plantarflexion    Ankle inversion    Ankle eversion    (Blank rows = not tested, ^ - limited ROM)  BED MOBILITY:  Sit to supine Modified independence  Supine to sit Modified independence Rolling to Right Modified independence  TRANSFERS: Assistive device utilized: None  Sit to stand: Complete Independence Stand to sit: Complete Independence Chair to chair:    Floor:     GAIT: Distance walked: clinic distances Assistive device utilized: Single point cane Level of assistance: Modified independence Gait pattern: step through pattern, decreased arm swing- Right, decreased arm swing- Left, and trunk flexed Comments: Pt reports he has been using his 4WW/rollator more commonly, especially outdoors  STAIRS:  Level of Assistance:     Stair Negotiation Technique:   Number of Stairs:    Height of Stairs:   Comments:   FUNCTIONAL TESTS:  5 times sit to stand: 13.69 sec w/o UE assist  Timed up and go  (TUG): 10.78 sec (Normal), 10.09 sec (Manual), 12.72 sec (Cognitive - unable to complete cognitive task w/o multiple errors) 10 meter walk test: 9.00 sec w/o AD; Gait speed = 3.64 ft/sec  Berg Balance Scale: TBA next visit Dynamic Gait Index: TBA next visit  Scores as of discharge from last PT episode - 09/07/2022: 5xSTS = 12.87 sec w/o UE assist TUG: Normal = 8.34 sec w/o AD; Manual = 9.16 sec; Cognitive = 14.06 sec : 6.44 sec w/o AD Gait speed: 5.09 ft/sec w/o AD Berg = 52/56; 52-55 lower fall risk (> 25%) FGA = 25/30; 25-28 = low risk fall   PATIENT SURVEYS:  ABC scale 980 / 1600 = 61.3 % (50-80% = moderate level of physical functioning); <69% indicates risk for recurrent falls in PD  Score as of discharge from last PT episode - 09/07/2022: ABC scale: 1170 / 1600 = 73.1 %    TODAY'S TREATMENT:   04/06/2023 - Eval SELF CARE:  Reviewed eval findings and role of PT in addressing identified deficits as well as instruction in initial HEP (see below).    PATIENT EDUCATION:  Education details: PT eval findings, anticipated POC, need for further assessment of Berg & FGA, and initial HEP  Person educated: Patient Education method: Explanation, Demonstration, Verbal cues, and Handouts Education comprehension: verbalized understanding, returned demonstration, verbal cues required, and needs further education  HOME EXERCISE PROGRAM: Access Code: [email or web address] URL: [email or web address] Date: 04/06/2023 Prepared by: Glenetta Hew  Exercises - Hip Flexor Stretch with Strap on Table  - 2 x daily - 7 x weekly - 3 reps - 30 sec hold - Hooklying Hamstring Stretch with Strap  - 2 x daily - 7 x weekly - 3 reps - 30 sec hold   ASSESSMENT:  CLINICAL IMPRESSION: Weaver Tweed is a 89 y.o. male who was referred to physical therapy for evaluation and treatment for Parkinson's disease. He has completed 4 prior PT episodes in 2018, 2022, 2023 and 2024 within the [facility] system.  Most recent PT episode for Parkinson's disease was 06/07/22 - 09/07/22.  Since his last PT episode, he reports feeling that he is slowing down with decreasing tolerance for ADLs and reports awareness of increasing weakness in his extremities.  His gait demonstrates increased flexed posture with reduced reciprocal arm swing, and he reports increased reliance on his 4WW/rollator.  Patient presents with physical impairments of decreased timing and coordination of gait, impaired ambulation, impaired standing balance, abnormal posture, bradykinesia with transfers, impaired activity tolerance, LE weakness, postural instability and decreased safety awareness impacting safe and independent functional mobility.  Examination revealed patient is at risk for functional decline as evidenced by declining scores on the following objective test measures from previous PT  episodes: 5xSTS of 13.69 sec, Gait speed 3.64 ft/sec, normal TUG of 10.78 sec, manual TUG of 10.09 sec, and cognitive TUG of 12.72 sec.  Further testing of balance with Berg and FGA to be completed next visit.  Brentyn Seehafer" will benefit from skilled PT to address above deficits to improve mobility and activity tolerance to help reach the maximal level of functional independence with mobility and gait with reduced risk for falls.  Patient demonstrates understanding of this POC and is in agreement with this plan.   OBJECTIVE IMPAIRMENTS: Abnormal gait, decreased activity tolerance, decreased balance, decreased coordination, decreased endurance, decreased knowledge of use of DME, decreased mobility, difficulty walking, decreased ROM, decreased strength, decreased safety awareness, dizziness, impaired perceived functional ability, impaired flexibility, improper body mechanics, postural dysfunction, and pain.   ACTIVITY LIMITATIONS: carrying, lifting, standing, squatting, stairs, transfers, bed mobility, reach over head, locomotion level, and caring  for others  PARTICIPATION LIMITATIONS: meal prep, cleaning, laundry, driving, shopping, and community activity  PERSONAL FACTORS: Age, Past/current experiences, Time since onset of injury/illness/exacerbation, and 3+ comorbidities: PPM 2 sick sinus syndrome/SA node dysfunction & complete heart block, orthostatic hypotension, COPD, OSA, HTN, DM2, Parkinson's Disease, CVA 03/2021, hypothyroidism, PTSD, bipolar I disorder   are also affecting patient's functional outcome.   REHAB POTENTIAL: Good  CLINICAL DECISION MAKING: Evolving/moderate complexity  EVALUATION COMPLEXITY: Moderate   GOALS: Goals reviewed with patient? Yes  SHORT TERM GOALS: Target date: 05/18/2023   Complete remaining standardized balance assessment and update LTG's accordingly Baseline: Berg and FGA TBA Goal status: INITIAL  Assess need for new rollator/4WW and provide recommendations to MD for DME prescription.  Baseline:  Goal status: INITIAL  Patient will be independent with initial HEP Baseline:  Goal status: INITIAL  Patient will demonstrate improved B proximal LE strength to >/= 4+/5 for improved stability and ease of mobility. Baseline: Refer to above LE MMT table Goal status: INITIAL  LONG TERM GOALS: Target date: 06/29/2023  Patient will be independent with ongoing/advanced HEP for self-management at home incorporating PWR! Moves as indicated .  Baseline:  Goal status: INITIAL  2.  Patient will be able to ambulate at least 800 ft with LRAD including outdoor and unlevel surfaces, independently with no LOB, for improved community gait.   Baseline:  Goal status: INITIAL  3.  Patient will be able to step up/down curb safely with LRAD for safety with community ambulation.  Baseline:  Goal status: INITIAL   4.  Patient will demonstrate at least 4 point improvement on FGA to improve gait stability and reduce risk for falls.  Baseline: TBA Goal status: INITIAL  5.  Patient will improve Berg score by  at least 8 points to improve safety and stability with ADLs in standing and reduce risk for falls.    Baseline: TBA Goal status: INITIAL  6.  Patient will report >/= 70% on ABC scale to demonstrate improved balance confidence and decreased risk for falls. Baseline: 980 / 1600 = 61.3 %  Goal status: INITIAL  7. Patient will verbalize understanding of local Parkinson's disease community resources, including community fitness post d/c. Baseline:  Goal status: INITIAL   PLAN:  PT FREQUENCY: 2x/week  PT DURATION: 8 weeks  PLANNED INTERVENTIONS: 97164- PT Re-evaluation, 97110-Therapeutic exercises, 97530- Therapeutic activity, 97112- Neuromuscular re-education, 97535- Self Care, 04540- Manual therapy, [phone]- Gait training, [phone]- Electrical stimulation (unattended), 97035- Ultrasound, 95621- Ionotophoresis 4mg /ml Dexamethasone, Patient/Family education, Balance training, Stair training, Taping, Dry Needling, Joint mobilization, DME instructions, Cryotherapy, Moist heat, and  28413- Physical Performance Test or Measurement  PLAN FOR NEXT SESSION: Complete standardized balance assessment - Berg and FGA; assess for best 4-wheel style walker and provide DME recommendation to MD; review initial HEP for proximal LE flexibility and update modify as indicated; initiate core/proximal LE strengthening; initiate PWR! Moves   [place], [place] 04/06/2023, 6:56 PM

## 2023-04-03 ENCOUNTER — Ambulatory Visit: Payer: Medicare Other

## 2023-04-03 DIAGNOSIS — I495 Sick sinus syndrome: Secondary | ICD-10-CM

## 2023-04-04 ENCOUNTER — Encounter: Payer: Self-pay | Admitting: Internal Medicine

## 2023-04-04 LAB — CUP PACEART REMOTE DEVICE CHECK
Battery Remaining Longevity: 144 mo
Battery Remaining Percentage: 100 %
Brady Statistic RA Percent Paced: 91 %
Brady Statistic RV Percent Paced: 100 %
Date Time Interrogation Session: 20250317005400
Implantable Lead Connection Status: 753985
Implantable Lead Connection Status: 753985
Implantable Lead Implant Date: 20150603
Implantable Lead Implant Date: 20150603
Implantable Lead Location: 753859
Implantable Lead Location: 753860
Implantable Lead Model: 4136
Implantable Lead Model: 4137
Implantable Lead Serial Number: 29477746
Implantable Lead Serial Number: 29617326
Implantable Pulse Generator Implant Date: 20240911
Lead Channel Impedance Value: 528 Ohm
Lead Channel Impedance Value: 691 Ohm
Lead Channel Pacing Threshold Amplitude: 0.4 V
Lead Channel Pacing Threshold Amplitude: 1.7 V
Lead Channel Pacing Threshold Pulse Width: 0.4 ms
Lead Channel Pacing Threshold Pulse Width: 0.4 ms
Lead Channel Setting Pacing Amplitude: 1.8 V
Lead Channel Setting Pacing Amplitude: 2.5 V
Lead Channel Setting Pacing Pulse Width: 0.4 ms
Lead Channel Setting Sensing Sensitivity: 2.5 mV
Pulse Gen Serial Number: 664123
Zone Setting Status: 755011

## 2023-04-05 ENCOUNTER — Other Ambulatory Visit: Payer: Self-pay | Admitting: Neurology

## 2023-04-05 DIAGNOSIS — G20A1 Parkinson's disease without dyskinesia, without mention of fluctuations: Secondary | ICD-10-CM

## 2023-04-06 ENCOUNTER — Other Ambulatory Visit: Payer: Self-pay

## 2023-04-06 ENCOUNTER — Encounter: Payer: Self-pay | Admitting: Physical Therapy

## 2023-04-06 ENCOUNTER — Ambulatory Visit: Admitting: Physical Therapy

## 2023-04-06 DIAGNOSIS — R2681 Unsteadiness on feet: Secondary | ICD-10-CM | POA: Diagnosis present

## 2023-04-06 DIAGNOSIS — R293 Abnormal posture: Secondary | ICD-10-CM | POA: Diagnosis present

## 2023-04-06 DIAGNOSIS — R2689 Other abnormalities of gait and mobility: Secondary | ICD-10-CM

## 2023-04-06 DIAGNOSIS — M6281 Muscle weakness (generalized): Secondary | ICD-10-CM

## 2023-04-06 NOTE — Addendum Note (Signed)
 Addended by: Marry Guan on: 04/06/2023 07:06 PM   Modules accepted: Orders

## 2023-04-11 ENCOUNTER — Ambulatory Visit: Admitting: Physical Therapy

## 2023-04-17 ENCOUNTER — Telehealth: Payer: Self-pay | Admitting: Physician Assistant

## 2023-04-17 NOTE — Telephone Encounter (Signed)
 Patient is not on clonazepam. The medication needed was carbamazepine and he had RF available. I called and he said he got it filled and he was all good now.

## 2023-04-17 NOTE — Telephone Encounter (Signed)
 Next appt is 05/09/23. Requesting refill on Clonazepam called to:  [id number] - [place], [place] - [address] AT [facility] [address]   Phone: [phone]  Fax: [phone]    He has been 3 days without it.

## 2023-04-18 ENCOUNTER — Encounter: Payer: Self-pay | Admitting: Physical Therapy

## 2023-04-18 ENCOUNTER — Ambulatory Visit: Payer: Self-pay | Attending: Neurology | Admitting: Physical Therapy

## 2023-04-18 DIAGNOSIS — M6281 Muscle weakness (generalized): Secondary | ICD-10-CM | POA: Insufficient documentation

## 2023-04-18 DIAGNOSIS — R2681 Unsteadiness on feet: Secondary | ICD-10-CM | POA: Insufficient documentation

## 2023-04-18 DIAGNOSIS — R293 Abnormal posture: Secondary | ICD-10-CM | POA: Insufficient documentation

## 2023-04-18 DIAGNOSIS — R2689 Other abnormalities of gait and mobility: Secondary | ICD-10-CM | POA: Diagnosis present

## 2023-04-18 NOTE — Therapy (Signed)
 OUTPATIENT PHYSICAL THERAPY TREATMENT   Patient Name: Victor Osborne MRN: [id number] DOB:1933-01-02, 89 y.o., male Today's Date: 04/18/2023   END OF SESSION:  PT End of Session - 04/18/23 1102     Visit Number 2    Date for PT Re-Evaluation 06/29/23    Authorization Type Medicare & [employer]    Progress Note Due on Visit 10    PT Start Time 1102    PT Stop Time 1146    PT Time Calculation (min) 44 min    Activity Tolerance Patient tolerated treatment well    Behavior During Therapy WFL for tasks assessed/performed              Past Medical History:  Diagnosis Date   Bipolar 1 disorder (HCC)    COPD (chronic obstructive pulmonary disease) (HCC)    Depression    First degree AV block    Hypertension    pt denies but was in medical record   Hypoglycemia, unspecified    Myalgia and myositis, unspecified    Obesity    Pneumonia 1970   Skin cancer (melanoma) (HCC)    Thyroid disease    Unspecified hypothyroidism    UPJ obstruction, congenital    Past Surgical History:  Procedure Laterality Date   APPENDECTOMY  1939   malignant melanoma removed from right forehead  2008   PERMANENT PACEMAKER INSERTION N/A 06/19/2013   Procedure: PERMANENT PACEMAKER INSERTION;  Surgeon: Marinus Maw, MD;  Location: [facility] CATH LAB;  Service: Cardiovascular;  Laterality: N/A;   PPM GENERATOR CHANGEOUT N/A 09/28/2022   Procedure: PPM GENERATOR CHANGEOUT;  Surgeon: Marinus Maw, MD;  Location: [facility] INVASIVE CV LAB;  Service: Cardiovascular;  Laterality: N/A;   right big toe  surgery  2009   TRANSURETHRAL RESECTION OF PROSTATE  02/04/2011   Procedure: TRANSURETHRAL RESECTION OF THE PROSTATE (TURP);  Surgeon: Crecencio Mc, MD;  Location: [facility] ORS;  Service: Urology;  Laterality: N/A;   URETHRA SURGERY  2010   reconstruction   Patient Active Problem List   Diagnosis Date Noted   Hyperlipidemia 03/15/2022   TIA (transient ischemic attack) 03/14/2022   History of neoplasm 04/12/2021    Melanocytic nevi of trunk 04/12/2021   Other seborrheic keratosis 04/12/2021   Cerebral embolism with cerebral infarction 03/31/2021   Left leg weakness 03/30/2021   Chronic post-traumatic stress disorder 12/05/2017   Bipolar I disorder, current or most recent episode depressed, in partial remission (HCC) 12/05/2017   CHB (complete heart block) (HCC) 01/27/2017   HTN (hypertension) 01/27/2017   Hypothyroidism 10/11/2016   Parkinson disease (HCC) 09/08/2016   Chronic osteoarthritis 03/20/2015   Bipolar 1 disorder (HCC) 03/20/2015   Tremor 03/20/2015   Pacemaker 09/24/2013   SSS (sick sinus syndrome) (HCC) 06/19/2013   First degree AV block 04/09/2013   Allergic rhinitis 03/17/2013   Obstructive sleep apnea 12/18/2006   COPD mixed type (HCC) 12/18/2006   History of malignant melanoma of skin 12/18/2006   Diabetes mellitus (HCC) 12/18/2006    PCP: Olive Bass, FNP   REFERRING PROVIDER: Vladimir Faster, DO   REFERRING DIAG: G20.A1 (ICD-10-CM) - Parkinson's disease without dyskinesia or fluctuating manifestations (HCC)   THERAPY DIAG:  Other abnormalities of gait and mobility  Unsteadiness on feet  Muscle weakness (generalized)  Abnormal posture  RATIONALE FOR EVALUATION AND TREATMENT: Rehabilitation  ONSET DATE: Initial PD diagnosis >15 yrs ago, worsening symptoms over past few months  NEXT MD VISIT: 08/24/2023   SUBJECTIVE:  SUBJECTIVE STATEMENT: Pt reports he feels that he needs to use the rollator more now than previously and notes more effort when tries to go without the walker. He would like to use a 4WW with bigger wheels - he found one that needed fixing up and he has been using this, but would prefer a new one.  He feels like his legs are getting weaker and  his activity tolerance is decreasing.  As of our PD screen on 02/21/23, He noted he was starting to feel the effects of the Parkinson's more - dizziness upon coming to standing and feeling weaker. Standing for ADLs cause him more fatigue than they used to, requiring seated rest breaks. He noted increased difficulty with UB dressing when trying to don a jacket. Walking requires more effort and he tires more easily. Feels more unsteady on his feet.   Pt accompanied by: self  PAIN: Are you having pain? Yes: NPRS scale: ~4/10 when getting up (depends on time of day) Pain location: B anterior thighs  Pain description: soreness  Aggravating factors: sit to stand transfers  Relieving factors: not indicated (resolves upon completion of motion)   PERTINENT HISTORY:  PPM 2 sick sinus syndrome/SA node dysfunction & complete heart block, orthostatic hypotension, COPD, OSA, HTN, DM2, Parkinson's Disease, CVA 03/2021, hypothyroidism, PTSD, bipolar I disorder   PRECAUTIONS: Fall and ICD/Pacemaker  RED FLAGS: None  WEIGHT BEARING RESTRICTIONS: No  FALLS:  Has patient fallen in last 6 months? No  LIVING ENVIRONMENT: Lives with: lives alone and with his dog Lives in: Other Independent living facility (3rd floor) Stairs:  Elevator Has following equipment at home: Single point cane, Environmental consultant - 4 wheeled, and hiking pole  OCCUPATION: Retired  PLOF: Independent and Leisure: being outdoors - walking his dog, and stays active most of the day  PATIENT GOALS: "To be better prepared for balance issues and be able to adjust to avoid falling."   OBJECTIVE: (objective measures completed at initial evaluation unless otherwise dated)  DIAGNOSTIC FINDINGS:  03/14/22 & 03/15/22 - CT Head w/o contrast:  IMPRESSION: 1. No evidence of acute intracranial abnormality. 2. Chronic small vessel ischemic disease and cerebral atrophy.  03/14/22 - CT Angio Head & Neck: IMPRESSION: 1. Negative CTA for large vessel  occlusion or other emergent finding. 2. Mild for age atheromatous change about the carotid bifurcations and carotid siphons without hemodynamically significant stenosis.  COGNITION: Overall cognitive status: Within functional limits for tasks assessed   SENSATION: WFL Except neuropathy in his feet - most notable at night  COORDINATION: Gross motor WFL B UE/LE   POSTURE:  rounded shoulders, forward head, and flexed trunk   MUSCLE LENGTH: Hamstrings: mod tight B ITB: mild/mod tight B Piriformis: mild/mod tight B Hip flexors: mod/severe tight B Quads: mod tight B Heelcord: mod tight L, mild tight R   LOWER EXTREMITY ROM:    Grossly WFL with exceptions of limitation related to muscle tightness as above  LOWER EXTREMITY MMT:    MMT Right eval Left eval  Hip flexion 4+ 4+  Hip extension 4 ^ 4 ^  Hip abduction 4- 4-  Hip adduction 4 4  Hip internal rotation 4+ 4+  Hip external rotation 4 4  Knee flexion 5 5  Knee extension 4+ 5  Ankle dorsiflexion 5 4+  Ankle plantarflexion    Ankle inversion    Ankle eversion    (Blank rows = not tested, ^ - limited ROM)  BED MOBILITY:  Sit to supine Modified independence  Supine to sit Modified independence Rolling to Right Modified independence  TRANSFERS: Assistive device utilized: None  Sit to stand: Complete Independence Stand to sit: Complete Independence Chair to chair:    Floor:     GAIT: Distance walked: clinic distances Assistive device utilized: Single point cane Level of assistance: Modified independence Gait pattern: step through pattern, decreased arm swing- Right, decreased arm swing- Left, and trunk flexed Comments: Pt reports he has been using his 4WW/rollator more commonly, especially outdoors  STAIRS:  Level of Assistance:     Stair Negotiation Technique:   Number of Stairs:    Height of Stairs:   Comments:   FUNCTIONAL TESTS:  5 times sit to stand: 13.69 sec w/o UE assist  Timed up and go (TUG):  10.78 sec (Normal), 10.09 sec (Manual), 12.72 sec (Cognitive - unable to complete cognitive task w/o multiple errors) 10 meter walk test: 9.00 sec w/o AD; Gait speed = 3.64 ft/sec  Berg Balance Scale: 44/56; 37-45 significant (>80%) risk for falls (04/18/23)  Dynamic Gait Index: 17/30; < 19 = high risk fall (04/18/23)  Scores as of discharge from last PT episode - 09/07/2022: 5xSTS = 12.87 sec w/o UE assist TUG: Normal = 8.34 sec w/o AD; Manual = 9.16 sec; Cognitive = 14.06 sec : 6.44 sec w/o AD Gait speed: 5.09 ft/sec w/o AD Berg = 52/56; 52-55 lower fall risk (> 25%) FGA = 25/30; 25-28 = low risk fall   PATIENT SURVEYS:  ABC scale 980 / 1600 = 61.3 % (50-80% = moderate level of physical functioning); <69% indicates risk for recurrent falls in PD  Score as of discharge from last PT episode - 09/07/2022: ABC scale: 1170 / 1600 = 73.1 %    TODAY'S TREATMENT:   04/18/2023  PHYSICAL PERFORMANCE TEST or MEASUREMENT: Berg = 44/56; scores of 37-45 indicate a significant (>80%) risk for falls FGA = 17/30; score of <19/30 indicate a high risk for falls  Berg Balance Test   Sit to Stand Able to stand without using hands and stabilize independently    Standing Unsupported Able to stand safely 2 minutes    Sitting with Back Unsupported but Feet Supported on Floor or Stool Able to sit safely and securely 2 minutes    Stand to Sit Sits safely with minimal use of hands    Transfers Able to transfer safely, definite need of hands    Standing Unsupported with Eyes Closed Able to stand 10 seconds with supervision    Standing Unsupported with Feet Together Able to place feet together independently and stand for 1 minute with supervision    From Standing, Reach Forward with Outstretched Arm Can reach forward >12 cm safely (5")    From Standing Position, Pick up Object from Floor Able to pick up shoe safely and easily    From Standing Position, Turn to Look Behind Over each Shoulder Looks behind one  side only/other side shows less weight shift    Turn 360 Degrees Able to turn 360 degrees safely but slowly    Standing Unsupported, Alternately Place Feet on Step/Stool Able to stand independently and safely and complete 8 steps in 20 seconds    Standing Unsupported, One Foot in Front Able to take small step independently and hold 30 seconds    Standing on One Leg Tries to lift leg/unable to hold 3 seconds but remains standing independently    Total Score 44    Berg comment: 37-45 significant risk for falls(>80%)  Functional Gait  Assessment   Gait Level Surface Walks 20 ft in less than 7 sec but greater than 5.5 sec, uses assistive device, slower speed, mild gait deviations, or deviates 6-10 in outside of the 12 in walkway width.    Change in Gait Speed Able to change speed, demonstrates mild gait deviations, deviates 6-10 in outside of the 12 in walkway width, or no gait deviations, unable to achieve a major change in velocity, or uses a change in velocity, or uses an assistive device.    Gait with Horizontal Head Turns Performs head turns smoothly with slight change in gait velocity (eg, minor disruption to smooth gait path), deviates 6-10 in outside 12 in walkway width, or uses an assistive device.    Gait with Vertical Head Turns Performs task with slight change in gait velocity (eg, minor disruption to smooth gait path), deviates 6 - 10 in outside 12 in walkway width or uses assistive device    Gait and Pivot Turn Pivot turns safely within 3 sec and stops quickly with no loss of balance.    Step Over Obstacle Is able to step over one shoe box (4.5 in total height) without changing gait speed. No evidence of imbalance.    Gait with Narrow Base of Support Ambulates less than 4 steps heel to toe or cannot perform without assistance.    Gait with Eyes Closed Walks 20 ft, slow speed, abnormal gait pattern, evidence for imbalance, deviates 10-15 in outside 12 in walkway width. Requires more than  9 sec to ambulate 20 ft.    Ambulating Backwards Walks 20 ft, slow speed, abnormal gait pattern, evidence for imbalance, deviates 10-15 in outside 12 in walkway width.    Steps Alternating feet, must use rail.    Total Score 17    FGA comment: < 19 = high risk fall      THERAPEUTIC EXERCISE: To improve ROM and flexibility.  Demonstration, verbal and tactile cues throughout for technique.  Supine HS stretch with strap 2 x 30" bil - pt notes better stretch with opp LE straight Mod thomas quad/hip flexor stretch with strap 2 x 30" on L   04/06/2023 - Eval SELF CARE:  Reviewed eval findings and role of PT in addressing identified deficits as well as instruction in initial HEP (see below).    PATIENT EDUCATION:  Education details: PT eval findings, anticipated POC, need for further assessment of Berg & FGA, and initial HEP  Person educated: Patient Education method: Explanation, Demonstration, Verbal cues, and Handouts Education comprehension: verbalized understanding, returned demonstration, verbal cues required, and needs further education  HOME EXERCISE PROGRAM: Access Code: [email or web address] URL: [email or web address] Date: 04/06/2023 Prepared by: Glenetta Hew  Exercises - Hip Flexor Stretch with Strap on Table  - 2 x daily - 7 x weekly - 3 reps - 30 sec hold - Hooklying Hamstring Stretch with Strap  - 2 x daily - 7 x weekly - 3 reps - 30 sec hold   ASSESSMENT:  CLINICAL IMPRESSION: Complete standardized balance testing with Sharlene Motts = 44/56 (scores of 37-45 indicate a significant (>80%) risk for falls) and FGA = 17/30 (score of <19/30 indicate a high risk for falls) demonstrating a decline from his status as of discharge from his last PT episode on 09/07/2022 where Berg = 52/56 (52-55 lower fall risk (> 25%)) and FGA = 25/30 (25-28 = low risk fall).  He demonstrated a much more pronounced fwd head and flexed trunk posture during assessment today.  Reviewed initial HEP stretches  from initial visit, reprinting handout as pt left the original behind at the clinic on his initial visit.  Will continue to address postural flexibility and strengthening in upcoming visits, adding in PWR! Moves as appropriate. He forgot to bring the 4WW he is looking to upgrade, therefore will plan to address this next visit and provide Dr. Arbutus Leas with details for new 4WW prescription.  Annette Stable will benefit from continue skilled PT to address ongoing deficits to improve mobility and activity tolerance to help reach the maximal level of functional independence with mobility and gait with reduced risk for falls.    OBJECTIVE IMPAIRMENTS: Abnormal gait, decreased activity tolerance, decreased balance, decreased coordination, decreased endurance, decreased knowledge of use of DME, decreased mobility, difficulty walking, decreased ROM, decreased strength, decreased safety awareness, dizziness, impaired perceived functional ability, impaired flexibility, improper body mechanics, postural dysfunction, and pain.   ACTIVITY LIMITATIONS: carrying, lifting, standing, squatting, stairs, transfers, bed mobility, reach over head, locomotion level, and caring for others  PARTICIPATION LIMITATIONS: meal prep, cleaning, laundry, driving, shopping, and community activity  PERSONAL FACTORS: Age, Past/current experiences, Time since onset of injury/illness/exacerbation, and 3+ comorbidities: PPM 2 sick sinus syndrome/SA node dysfunction & complete heart block, orthostatic hypotension, COPD, OSA, HTN, DM2, Parkinson's Disease, CVA 03/2021, hypothyroidism, PTSD, bipolar I disorder   are also affecting patient's functional outcome.   REHAB POTENTIAL: Good  CLINICAL DECISION MAKING: Evolving/moderate complexity  EVALUATION COMPLEXITY: Moderate   GOALS: Goals reviewed with patient? Yes  SHORT TERM GOALS: Target date: 05/18/2023   Complete remaining standardized balance assessment and update LTG's accordingly Baseline: Berg  and FGA TBA Goal status: MET - 04/18/23  Assess need for new rollator/4WW and provide recommendations to MD for DME prescription.  Baseline:  Goal status: IN PROGRESS  Patient will be independent with initial HEP Baseline:  Goal status: IN PROGRESS  Patient will demonstrate improved B proximal LE strength to >/= 4+/5 for improved stability and ease of mobility. Baseline: Refer to above LE MMT table Goal status: IN PROGRESS  LONG TERM GOALS: Target date: 06/29/2023  Patient will be independent with ongoing/advanced HEP for self-management at home incorporating PWR! Moves as indicated .  Baseline:  Goal status: IN PROGRESS  2.  Patient will be able to ambulate at least 800 ft with LRAD including outdoor and unlevel surfaces, independently with no LOB, for improved community gait.   Baseline:  Goal status: IN PROGRESS  3.  Patient will be able to step up/down curb safely with LRAD for safety with community ambulation.  Baseline:  Goal status: IN PROGRESS   4.  Patient will demonstrate at least 4 point improvement on FGA to improve gait stability and reduce risk for falls.  Baseline: 17/30 (04/18/23) Goal status: IN PROGRESS  5.  Patient will improve Berg score by at least 8 points to improve safety and stability with ADLs in standing and reduce risk for falls.    Baseline: 44/56 (04/18/23) Goal status: IN PROGRESS  6.  Patient will report >/= 70% on ABC scale to demonstrate improved balance confidence and decreased risk for falls. Baseline: 980 / 1600 = 61.3 %  Goal status: IN PROGRESS  7. Patient will verbalize understanding of local Parkinson's disease community resources, including community fitness post d/c. Baseline:  Goal status: IN PROGRESS   PLAN:  PT FREQUENCY: 2x/week  PT DURATION: 8 weeks  PLANNED INTERVENTIONS: 97164- PT Re-evaluation, 97110-Therapeutic exercises, 97530- Therapeutic activity, [id number]- Neuromuscular re-education, 97535- Self Care, [id number]- Manual  therapy, [id number]- Gait training, [phone]- Electrical stimulation (unattended), [phone]- Ultrasound, [id number]- Ionotophoresis 4mg /ml Dexamethasone, Patient/Family education, Balance training, Stair training, Taping, Dry Needling, Joint mobilization, DME instructions, Cryotherapy, Moist heat, and 95621- Physical Performance Test or Measurement  PLAN FOR NEXT SESSION: Assess for best 4-wheel style walker and provide DME recommendation to MD; progress postural and proximal LE flexibility and initiate core/proximal LE strengthening - update/modify HEP as indicated; initiate PWR! Moves   [place], [place] 04/18/2023, 12:31 PM

## 2023-04-25 ENCOUNTER — Encounter: Payer: Self-pay | Admitting: Physical Therapy

## 2023-04-25 ENCOUNTER — Ambulatory Visit: Admitting: Physical Therapy

## 2023-04-25 DIAGNOSIS — R293 Abnormal posture: Secondary | ICD-10-CM

## 2023-04-25 DIAGNOSIS — R2689 Other abnormalities of gait and mobility: Secondary | ICD-10-CM

## 2023-04-25 DIAGNOSIS — R2681 Unsteadiness on feet: Secondary | ICD-10-CM

## 2023-04-25 DIAGNOSIS — M6281 Muscle weakness (generalized): Secondary | ICD-10-CM

## 2023-04-25 NOTE — Therapy (Signed)
 OUTPATIENT PHYSICAL THERAPY TREATMENT   Patient Name: Victor Osborne MRN: [id number] DOB:22-Mar-1932, 89 y.o., male Today's Date: 04/25/2023   END OF SESSION:  PT End of Session - 04/25/23 1148     Visit Number 3    Date for PT Re-Evaluation 06/29/23    Authorization Type Medicare & [employer]    Progress Note Due on Visit 10    PT Start Time 1148    PT Stop Time 1229    PT Time Calculation (min) 41 min    Activity Tolerance Patient tolerated treatment well    Behavior During Therapy WFL for tasks assessed/performed               Past Medical History:  Diagnosis Date   Bipolar 1 disorder (HCC)    COPD (chronic obstructive pulmonary disease) (HCC)    Depression    First degree AV block    Hypertension    pt denies but was in medical record   Hypoglycemia, unspecified    Myalgia and myositis, unspecified    Obesity    Pneumonia 1970   Skin cancer (melanoma) (HCC)    Thyroid disease    Unspecified hypothyroidism    UPJ obstruction, congenital    Past Surgical History:  Procedure Laterality Date   APPENDECTOMY  1939   malignant melanoma removed from right forehead  2008   PERMANENT PACEMAKER INSERTION N/A 06/19/2013   Procedure: PERMANENT PACEMAKER INSERTION;  Surgeon: Marinus Maw, MD;  Location: [facility] CATH LAB;  Service: Cardiovascular;  Laterality: N/A;   PPM GENERATOR CHANGEOUT N/A 09/28/2022   Procedure: PPM GENERATOR CHANGEOUT;  Surgeon: Marinus Maw, MD;  Location: [facility] INVASIVE CV LAB;  Service: Cardiovascular;  Laterality: N/A;   right big toe  surgery  2009   TRANSURETHRAL RESECTION OF PROSTATE  02/04/2011   Procedure: TRANSURETHRAL RESECTION OF THE PROSTATE (TURP);  Surgeon: Crecencio Mc, MD;  Location: [facility] ORS;  Service: Urology;  Laterality: N/A;   URETHRA SURGERY  2010   reconstruction   Patient Active Problem List   Diagnosis Date Noted   Hyperlipidemia 03/15/2022   TIA (transient ischemic attack) 03/14/2022   History of neoplasm 04/12/2021    Melanocytic nevi of trunk 04/12/2021   Other seborrheic keratosis 04/12/2021   Cerebral embolism with cerebral infarction 03/31/2021   Left leg weakness 03/30/2021   Chronic post-traumatic stress disorder 12/05/2017   Bipolar I disorder, current or most recent episode depressed, in partial remission (HCC) 12/05/2017   CHB (complete heart block) (HCC) 01/27/2017   HTN (hypertension) 01/27/2017   Hypothyroidism 10/11/2016   Parkinson disease (HCC) 09/08/2016   Chronic osteoarthritis 03/20/2015   Bipolar 1 disorder (HCC) 03/20/2015   Tremor 03/20/2015   Pacemaker 09/24/2013   SSS (sick sinus syndrome) (HCC) 06/19/2013   First degree AV block 04/09/2013   Allergic rhinitis 03/17/2013   Obstructive sleep apnea 12/18/2006   COPD mixed type (HCC) 12/18/2006   History of malignant melanoma of skin 12/18/2006   Diabetes mellitus (HCC) 12/18/2006    PCP: Olive Bass, FNP   REFERRING PROVIDER: Vladimir Faster, DO   REFERRING DIAG: G20.A1 (ICD-10-CM) - Parkinson's disease without dyskinesia or fluctuating manifestations (HCC)   THERAPY DIAG:  Other abnormalities of gait and mobility  Unsteadiness on feet  Muscle weakness (generalized)  Abnormal posture  RATIONALE FOR EVALUATION AND TREATMENT: Rehabilitation  ONSET DATE: Initial PD diagnosis >15 yrs ago, worsening symptoms over past few months  NEXT MD VISIT: 08/24/2023   SUBJECTIVE:  SUBJECTIVE STATEMENT: Pt reports he took the 4WW apart at home and is having it customized for his height so he was unable to bring it today.  EVAL:  Pt reports he feels that he needs to use the rollator more now than previously and notes more effort when tries to go without the walker. He would like to use a 4WW with bigger wheels - he found  one that needed fixing up and he has been using this, but would prefer a new one.  He feels like his legs are getting weaker and his activity tolerance is decreasing.  As of our PD screen on 02/21/23, He noted he was starting to feel the effects of the Parkinson's more - dizziness upon coming to standing and feeling weaker. Standing for ADLs cause him more fatigue than they used to, requiring seated rest breaks. He noted increased difficulty with UB dressing when trying to don a jacket. Walking requires more effort and he tires more easily. Feels more unsteady on his feet.   Pt accompanied by: self  PAIN: Are you having pain? Yes: NPRS scale: ~4/10 when getting up (depends on time of day) Pain location: B anterior thighs  Pain description: soreness  Aggravating factors: sit to stand transfers  Relieving factors: not indicated (resolves upon completion of motion)   PERTINENT HISTORY:  PPM 2 sick sinus syndrome/SA node dysfunction & complete heart block, orthostatic hypotension, COPD, OSA, HTN, DM2, Parkinson's Disease, CVA 03/2021, hypothyroidism, PTSD, bipolar I disorder   PRECAUTIONS: Fall and ICD/Pacemaker  RED FLAGS: None  WEIGHT BEARING RESTRICTIONS: No  FALLS:  Has patient fallen in last 6 months? No  LIVING ENVIRONMENT: Lives with: lives alone and with his dog Lives in: Other Independent living facility (3rd floor) Stairs:  Elevator Has following equipment at home: Single point cane, Environmental consultant - 4 wheeled, and hiking pole  OCCUPATION: Retired  PLOF: Independent and Leisure: being outdoors - walking his dog, and stays active most of the day  PATIENT GOALS: "To be better prepared for balance issues and be able to adjust to avoid falling."   OBJECTIVE: (objective measures completed at initial evaluation unless otherwise dated)  DIAGNOSTIC FINDINGS:  03/14/22 & 03/15/22 - CT Head w/o contrast:  IMPRESSION: 1. No evidence of acute intracranial abnormality. 2. Chronic small vessel  ischemic disease and cerebral atrophy.  03/14/22 - CT Angio Head & Neck: IMPRESSION: 1. Negative CTA for large vessel occlusion or other emergent finding. 2. Mild for age atheromatous change about the carotid bifurcations and carotid siphons without hemodynamically significant stenosis.  COGNITION: Overall cognitive status: Within functional limits for tasks assessed   SENSATION: WFL Except neuropathy in his feet - most notable at night  COORDINATION: Gross motor WFL B UE/LE   POSTURE:  rounded shoulders, forward head, and flexed trunk   MUSCLE LENGTH: Hamstrings: mod tight B ITB: mild/mod tight B Piriformis: mild/mod tight B Hip flexors: mod/severe tight B Quads: mod tight B Heelcord: mod tight L, mild tight R   LOWER EXTREMITY ROM:    Grossly WFL with exceptions of limitation related to muscle tightness as above  LOWER EXTREMITY MMT:    MMT Right eval Left eval  Hip flexion 4+ 4+  Hip extension 4 ^ 4 ^  Hip abduction 4- 4-  Hip adduction 4 4  Hip internal rotation 4+ 4+  Hip external rotation 4 4  Knee flexion 5 5  Knee extension 4+ 5  Ankle dorsiflexion 5 4+  Ankle plantarflexion  Ankle inversion    Ankle eversion    (Blank rows = not tested, ^ - limited ROM)  BED MOBILITY:  Sit to supine Modified independence Supine to sit Modified independence Rolling to Right Modified independence  TRANSFERS: Assistive device utilized: None  Sit to stand: Complete Independence Stand to sit: Complete Independence Chair to chair:    Floor:     GAIT: Distance walked: clinic distances Assistive device utilized: Single point cane Level of assistance: Modified independence Gait pattern: step through pattern, decreased arm swing- Right, decreased arm swing- Left, and trunk flexed Comments: Pt reports he has been using his 4WW/rollator more commonly, especially outdoors  STAIRS:  Level of Assistance:     Stair Negotiation Technique:   Number of Stairs:    Height  of Stairs:   Comments:   FUNCTIONAL TESTS:  5 times sit to stand: 13.69 sec w/o UE assist  Timed up and go (TUG): 10.78 sec (Normal), 10.09 sec (Manual), 12.72 sec (Cognitive - unable to complete cognitive task w/o multiple errors) 10 meter walk test: 9.00 sec w/o AD; Gait speed = 3.64 ft/sec  Berg Balance Scale: 44/56; 37-45 significant (>80%) risk for falls (04/18/23)  Dynamic Gait Index: 17/30; < 19 = high risk fall (04/18/23)  Scores as of discharge from last PT episode - 09/07/2022: 5xSTS = 12.87 sec w/o UE assist TUG: Normal = 8.34 sec w/o AD; Manual = 9.16 sec; Cognitive = 14.06 sec : 6.44 sec w/o AD Gait speed: 5.09 ft/sec w/o AD Berg = 52/56; 52-55 lower fall risk (> 25%) FGA = 25/30; 25-28 = low risk fall   PATIENT SURVEYS:  ABC scale 980 / 1600 = 61.3 % (50-80% = moderate level of physical functioning); <69% indicates risk for recurrent falls in PD  Score as of discharge from last PT episode - 09/07/2022: ABC scale: 1170 / 1600 = 73.1 %    TODAY'S TREATMENT:   04/25/2023 THERAPEUTIC EXERCISE: To improve strength and endurance.  Demonstration, verbal and tactile cues throughout for technique.  NuStep - L5 x 5 minutes (UE/LE to promote reciprocal movement patterns)  NEUROMUSCULAR RE-EDUCATION: To improve balance, coordination, kinesthesia, posture, proprioception, reduce fall risk, amplitude of movement, speed of movement to reduce bradykinesia, and reduce rigidity. Seated PWR! Moves - Up, Rock, Twist & Step 2 x 10 each, PT leading 1st set with pt attempting to lead 2nd set but intermittent cues/reminders necessary Standing PWR! Moves - Up, [employer], [place] & Step x 10 each   04/18/2023  PHYSICAL PERFORMANCE TEST or MEASUREMENT: Berg = 44/56; scores of 37-45 indicate a significant (>80%) risk for falls FGA = 17/30; score of <19/30 indicate a high risk for falls  Berg Balance Test   Sit to Stand Able to stand without using hands and stabilize independently    Standing  Unsupported Able to stand safely 2 minutes    Sitting with Back Unsupported but Feet Supported on Floor or Stool Able to sit safely and securely 2 minutes    Stand to Sit Sits safely with minimal use of hands    Transfers Able to transfer safely, definite need of hands    Standing Unsupported with Eyes Closed Able to stand 10 seconds with supervision    Standing Unsupported with Feet Together Able to place feet together independently and stand for 1 minute with supervision    From Standing, Reach Forward with Outstretched Arm Can reach forward >12 cm safely (5")    From Standing Position, Pick up Object from Floor  Able to pick up shoe safely and easily    From Standing Position, Turn to Look Behind Over each Shoulder Looks behind one side only/other side shows less weight shift    Turn 360 Degrees Able to turn 360 degrees safely but slowly    Standing Unsupported, Alternately Place Feet on Step/Stool Able to stand independently and safely and complete 8 steps in 20 seconds    Standing Unsupported, One Foot in Front Able to take small step independently and hold 30 seconds    Standing on One Leg Tries to lift leg/unable to hold 3 seconds but remains standing independently    Total Score 44    Berg comment: 37-45 significant risk for falls(>80%)      Functional Gait  Assessment   Gait Level Surface Walks 20 ft in less than 7 sec but greater than 5.5 sec, uses assistive device, slower speed, mild gait deviations, or deviates 6-10 in outside of the 12 in walkway width.    Change in Gait Speed Able to change speed, demonstrates mild gait deviations, deviates 6-10 in outside of the 12 in walkway width, or no gait deviations, unable to achieve a major change in velocity, or uses a change in velocity, or uses an assistive device.    Gait with Horizontal Head Turns Performs head turns smoothly with slight change in gait velocity (eg, minor disruption to smooth gait path), deviates 6-10 in outside 12 in  walkway width, or uses an assistive device.    Gait with Vertical Head Turns Performs task with slight change in gait velocity (eg, minor disruption to smooth gait path), deviates 6 - 10 in outside 12 in walkway width or uses assistive device    Gait and Pivot Turn Pivot turns safely within 3 sec and stops quickly with no loss of balance.    Step Over Obstacle Is able to step over one shoe box (4.5 in total height) without changing gait speed. No evidence of imbalance.    Gait with Narrow Base of Support Ambulates less than 4 steps heel to toe or cannot perform without assistance.    Gait with Eyes Closed Walks 20 ft, slow speed, abnormal gait pattern, evidence for imbalance, deviates 10-15 in outside 12 in walkway width. Requires more than 9 sec to ambulate 20 ft.    Ambulating Backwards Walks 20 ft, slow speed, abnormal gait pattern, evidence for imbalance, deviates 10-15 in outside 12 in walkway width.    Steps Alternating feet, must use rail.    Total Score 17    FGA comment: < 19 = high risk fall      THERAPEUTIC EXERCISE: To improve ROM and flexibility.  Demonstration, verbal and tactile cues throughout for technique.  Supine HS stretch with strap 2 x 30" bil - pt notes better stretch with opp LE straight Mod thomas quad/hip flexor stretch with strap 2 x 30" on L   04/06/2023 - Eval SELF CARE:  Reviewed eval findings and role of PT in addressing identified deficits as well as instruction in initial HEP (see below).    PATIENT EDUCATION:  Education details: PWR! Moves - Sitting & Standing   Person educated: Patient Education method: Explanation, Demonstration, Verbal cues, and Handouts Education comprehension: verbalized understanding, returned demonstration, verbal cues required, and needs further education  HOME EXERCISE PROGRAM: Access Code: [email or web address] URL: [email or web address] Date: 04/06/2023 Prepared by: Glenetta Hew  Exercises - Hip Flexor Stretch with Strap  on Table  - 2 x daily - 7 x  weekly - 3 reps - 30 sec hold - Hooklying Hamstring Stretch with Strap  - 2 x daily - 7 x weekly - 3 reps - 30 sec hold  PWR! Moves: Seated Standing   ASSESSMENT:  CLINICAL IMPRESSION: Victor Osborne" reports he took his recently acquired 4WW apart to customize it to make it taller for his height and is still waiting on a part so he was unable to bring it today.  He denies need for review of the HEP stretches today and expressed interest in getting started with the PWR! Moves today, therefore focused on instruction in seated and standing PWR! Moves to facilitate improve posture, weight shift, trunk rotation to reduce stiffness and transitional movements with PT providing VC and demonstration of movement patterns mirroring patient during performance.  Will review these positions and progress through remaining positions in upcoming visits as well as work on strengthening for core and proximal LEs.  Victor Osborne will benefit from continue skilled PT to address ongoing deficits to improve mobility and activity tolerance to help reach the maximal level of functional independence with mobility and gait with reduced risk for falls.    OBJECTIVE IMPAIRMENTS: Abnormal gait, decreased activity tolerance, decreased balance, decreased coordination, decreased endurance, decreased knowledge of use of DME, decreased mobility, difficulty walking, decreased ROM, decreased strength, decreased safety awareness, dizziness, impaired perceived functional ability, impaired flexibility, improper body mechanics, postural dysfunction, and pain.   ACTIVITY LIMITATIONS: carrying, lifting, standing, squatting, stairs, transfers, bed mobility, reach over head, locomotion level, and caring for others  PARTICIPATION LIMITATIONS: meal prep, cleaning, laundry, driving, shopping, and community activity  PERSONAL FACTORS: Age, Past/current experiences, Time since onset of injury/illness/exacerbation, and 3+  comorbidities: PPM 2 sick sinus syndrome/SA node dysfunction & complete heart block, orthostatic hypotension, COPD, OSA, HTN, DM2, Parkinson's Disease, CVA 03/2021, hypothyroidism, PTSD, bipolar I disorder   are also affecting patient's functional outcome.   REHAB POTENTIAL: Good  CLINICAL DECISION MAKING: Evolving/moderate complexity  EVALUATION COMPLEXITY: Moderate   GOALS: Goals reviewed with patient? Yes  SHORT TERM GOALS: Target date: 05/18/2023   Complete remaining standardized balance assessment and update LTG's accordingly Baseline: Berg and FGA TBA Goal status: MET - 04/18/23  Assess need for new rollator/4WW and provide recommendations to MD for DME prescription.  Baseline:  Goal status: IN PROGRESS  Patient will be independent with initial HEP Baseline:  Goal status: IN PROGRESS  Patient will demonstrate improved B proximal LE strength to >/= 4+/5 for improved stability and ease of mobility. Baseline: Refer to above LE MMT table Goal status: IN PROGRESS  LONG TERM GOALS: Target date: 06/29/2023  Patient will be independent with ongoing/advanced HEP for self-management at home incorporating PWR! Moves as indicated .  Baseline:  Goal status: IN PROGRESS  2.  Patient will be able to ambulate at least 800 ft with LRAD including outdoor and unlevel surfaces, independently with no LOB, for improved community gait.   Baseline:  Goal status: IN PROGRESS  3.  Patient will be able to step up/down curb safely with LRAD for safety with community ambulation.  Baseline:  Goal status: IN PROGRESS   4.  Patient will demonstrate at least 4 point improvement on FGA to improve gait stability and reduce risk for falls.  Baseline: 17/30 (04/18/23) Goal status: IN PROGRESS  5.  Patient will improve Berg score by at least 8 points to improve safety and stability with ADLs in standing and reduce risk for falls.    Baseline: 44/56 (04/18/23) Goal status:  IN PROGRESS  6.  Patient will  report >/= 70% on ABC scale to demonstrate improved balance confidence and decreased risk for falls. Baseline: 980 / 1600 = 61.3 %  Goal status: IN PROGRESS  7. Patient will verbalize understanding of local Parkinson's disease community resources, including community fitness post d/c. Baseline:  Goal status: IN PROGRESS   PLAN:  PT FREQUENCY: 2x/week  PT DURATION: 8 weeks  PLANNED INTERVENTIONS: 97164- PT Re-evaluation, 97110-Therapeutic exercises, 97530- Therapeutic activity, 97112- Neuromuscular re-education, 97535- Self Care, 16109- Manual therapy, [phone]- Gait training, [phone]- Electrical stimulation (unattended), 97035- Ultrasound, 91478- Ionotophoresis 4mg /ml Dexamethasone, Patient/Family education, Balance training, Stair training, Taping, Dry Needling, Joint mobilization, DME instructions, Cryotherapy, Moist heat, and 97750- Physical Performance Test or Measurement  PLAN FOR NEXT SESSION: Assess for best 4-wheel style walker and provide DME recommendation to MD; progress postural and proximal LE flexibility and initiate core/proximal LE strengthening - update/modify HEP as indicated; initiate PWR! Moves   [place], [place] 04/25/2023, 1:12 PM

## 2023-04-27 ENCOUNTER — Ambulatory Visit: Admitting: Physical Therapy

## 2023-04-27 ENCOUNTER — Encounter: Payer: Self-pay | Admitting: Physical Therapy

## 2023-04-27 DIAGNOSIS — R2689 Other abnormalities of gait and mobility: Secondary | ICD-10-CM | POA: Diagnosis not present

## 2023-04-27 DIAGNOSIS — R2681 Unsteadiness on feet: Secondary | ICD-10-CM

## 2023-04-27 DIAGNOSIS — R293 Abnormal posture: Secondary | ICD-10-CM

## 2023-04-27 DIAGNOSIS — M6281 Muscle weakness (generalized): Secondary | ICD-10-CM

## 2023-04-27 NOTE — Therapy (Signed)
 OUTPATIENT PHYSICAL THERAPY TREATMENT   Patient Name: Victor Osborne MRN: [id number] DOB:[birth year], 89 y.o., male Today's Date: 04/27/2023   END OF SESSION:  PT End of Session - 04/27/23 1146     Visit Number 4    Date for PT Re-Evaluation 06/29/23    Authorization Type Medicare & [employer]    Progress Note Due on Visit 10    PT Start Time 1146    PT Stop Time 1231    PT Time Calculation (min) 45 min    Activity Tolerance Patient tolerated treatment well    Behavior During Therapy WFL for tasks assessed/performed                Past Medical History:  Diagnosis Date   Bipolar 1 disorder (HCC)    COPD (chronic obstructive pulmonary disease) (HCC)    Depression    First degree AV block    Hypertension    pt denies but was in medical record   Hypoglycemia, unspecified    Myalgia and myositis, unspecified    Obesity    Pneumonia 1970   Skin cancer (melanoma) (HCC)    Thyroid disease    Unspecified hypothyroidism    UPJ obstruction, congenital    Past Surgical History:  Procedure Laterality Date   APPENDECTOMY  1939   malignant melanoma removed from right forehead  2008   PERMANENT PACEMAKER INSERTION N/A 06/19/2013   Procedure: PERMANENT PACEMAKER INSERTION;  Surgeon: Marinus Maw, MD;  Location: [facility] CATH LAB;  Service: Cardiovascular;  Laterality: N/A;   PPM GENERATOR CHANGEOUT N/A 09/28/2022   Procedure: PPM GENERATOR CHANGEOUT;  Surgeon: Marinus Maw, MD;  Location: [facility] INVASIVE CV LAB;  Service: Cardiovascular;  Laterality: N/A;   right big toe  surgery  2009   TRANSURETHRAL RESECTION OF PROSTATE  02/04/2011   Procedure: TRANSURETHRAL RESECTION OF THE PROSTATE (TURP);  Surgeon: Crecencio Mc, MD;  Location: [facility] ORS;  Service: Urology;  Laterality: N/A;   URETHRA SURGERY  2010   reconstruction   Patient Active Problem List   Diagnosis Date Noted   Hyperlipidemia 03/15/2022   TIA (transient ischemic attack) 03/14/2022   History of neoplasm  04/12/2021   Melanocytic nevi of trunk 04/12/2021   Other seborrheic keratosis 04/12/2021   Cerebral embolism with cerebral infarction 03/31/2021   Left leg weakness 03/30/2021   Chronic post-traumatic stress disorder 12/05/2017   Bipolar I disorder, current or most recent episode depressed, in partial remission (HCC) 12/05/2017   CHB (complete heart block) (HCC) 01/27/2017   HTN (hypertension) 01/27/2017   Hypothyroidism 10/11/2016   Parkinson disease (HCC) 09/08/2016   Chronic osteoarthritis 03/20/2015   Bipolar 1 disorder (HCC) 03/20/2015   Tremor 03/20/2015   Pacemaker 09/24/2013   SSS (sick sinus syndrome) (HCC) 06/19/2013   First degree AV block 04/09/2013   Allergic rhinitis 03/17/2013   Obstructive sleep apnea 12/18/2006   COPD mixed type (HCC) 12/18/2006   History of malignant melanoma of skin 12/18/2006   Diabetes mellitus (HCC) 12/18/2006    PCP: Olive Bass, FNP   REFERRING PROVIDER: Vladimir Faster, DO   REFERRING DIAG: G20.A1 (ICD-10-CM) - Parkinson's disease without dyskinesia or fluctuating manifestations (HCC)   THERAPY DIAG:  Other abnormalities of gait and mobility  Unsteadiness on feet  Muscle weakness (generalized)  Abnormal posture  RATIONALE FOR EVALUATION AND TREATMENT: Rehabilitation  ONSET DATE: Initial PD diagnosis >15 yrs ago, worsening symptoms over past few months  NEXT MD VISIT: 08/24/2023  SUBJECTIVE:                                                                                                                                                                                                         SUBJECTIVE STATEMENT: Pt reports he has tried the [employer]! Moves at home with no difficulty.  EVAL:  Pt reports he feels that he needs to use the rollator more now than previously and notes more effort when tries to go without the walker. He would like to use a 4WW with bigger wheels - he found one that needed fixing up and he has been  using this, but would prefer a new one.  He feels like his legs are getting weaker and his activity tolerance is decreasing.  As of our PD screen on 02/21/23, He noted he was starting to feel the effects of the Parkinson's more - dizziness upon coming to standing and feeling weaker. Standing for ADLs cause him more fatigue than they used to, requiring seated rest breaks. He noted increased difficulty with UB dressing when trying to don a jacket. Walking requires more effort and he tires more easily. Feels more unsteady on his feet.   Pt accompanied by: self  PAIN: Are you having pain? Yes: NPRS scale: ~4/10 when getting up (depends on time of day) Pain location: B anterior thighs  Pain description: soreness  Aggravating factors: sit to stand transfers  Relieving factors: not indicated (resolves upon completion of motion)   PERTINENT HISTORY:  PPM 2 sick sinus syndrome/SA node dysfunction & complete heart block, orthostatic hypotension, COPD, OSA, HTN, DM2, Parkinson's Disease, CVA 03/2021, hypothyroidism, PTSD, bipolar I disorder   PRECAUTIONS: Fall and ICD/Pacemaker  RED FLAGS: None  WEIGHT BEARING RESTRICTIONS: No  FALLS:  Has patient fallen in last 6 months? No  LIVING ENVIRONMENT: Lives with: lives alone and with his dog Lives in: Other Independent living facility (3rd floor) Stairs:  Elevator Has following equipment at home: Single point cane, Environmental consultant - 4 wheeled, and hiking pole  OCCUPATION: Retired  PLOF: Independent and Leisure: being outdoors - walking his dog, and stays active most of the day  PATIENT GOALS: "To be better prepared for balance issues and be able to adjust to avoid falling."   OBJECTIVE: (objective measures completed at initial evaluation unless otherwise dated)  DIAGNOSTIC FINDINGS:  03/14/22 & 03/15/22 - CT Head w/o contrast:  IMPRESSION: 1. No evidence of acute intracranial abnormality. 2. Chronic small vessel ischemic disease and cerebral  atrophy.  03/14/22 - CT Angio Head & Neck: IMPRESSION: 1.  Negative CTA for large vessel occlusion or other emergent finding. 2. Mild for age atheromatous change about the carotid bifurcations and carotid siphons without hemodynamically significant stenosis.  COGNITION: Overall cognitive status: Within functional limits for tasks assessed   SENSATION: WFL Except neuropathy in his feet - most notable at night  COORDINATION: Gross motor WFL B UE/LE   POSTURE:  rounded shoulders, forward head, and flexed trunk   MUSCLE LENGTH: Hamstrings: mod tight B ITB: mild/mod tight B Piriformis: mild/mod tight B Hip flexors: mod/severe tight B Quads: mod tight B Heelcord: mod tight L, mild tight R   LOWER EXTREMITY ROM:    Grossly WFL with exceptions of limitation related to muscle tightness as above  LOWER EXTREMITY MMT:    MMT Right eval Left eval  Hip flexion 4+ 4+  Hip extension 4 ^ 4 ^  Hip abduction 4- 4-  Hip adduction 4 4  Hip internal rotation 4+ 4+  Hip external rotation 4 4  Knee flexion 5 5  Knee extension 4+ 5  Ankle dorsiflexion 5 4+  Ankle plantarflexion    Ankle inversion    Ankle eversion    (Blank rows = not tested, ^ - limited ROM)  BED MOBILITY:  Sit to supine Modified independence Supine to sit Modified independence Rolling to Right Modified independence  TRANSFERS: Assistive device utilized: None  Sit to stand: Complete Independence Stand to sit: Complete Independence Chair to chair:    Floor:     GAIT: Distance walked: clinic distances Assistive device utilized: Single point cane Level of assistance: Modified independence Gait pattern: step through pattern, decreased arm swing- Right, decreased arm swing- Left, and trunk flexed Comments: Pt reports he has been using his 4WW/rollator more commonly, especially outdoors  STAIRS:  Level of Assistance:     Psychologist, counselling Technique:   Number of Stairs:    Height of Stairs:   Comments:    FUNCTIONAL TESTS:  5 times sit to stand: 13.69 sec w/o UE assist  Timed up and go (TUG): 10.78 sec (Normal), 10.09 sec (Manual), 12.72 sec (Cognitive - unable to complete cognitive task w/o multiple errors) 10 meter walk test: 9.00 sec w/o AD; Gait speed = 3.64 ft/sec  Berg Balance Scale: 44/56; 37-45 significant (>80%) risk for falls (04/18/23)  Dynamic Gait Index: 17/30; < 19 = high risk fall (04/18/23)  Scores as of discharge from last PT episode - 09/07/2022: 5xSTS = 12.87 sec w/o UE assist TUG: Normal = 8.34 sec w/o AD; Manual = 9.16 sec; Cognitive = 14.06 sec : 6.44 sec w/o AD Gait speed: 5.09 ft/sec w/o AD Berg = 52/56; 52-55 lower fall risk (> 25%) FGA = 25/30; 25-28 = low risk fall   PATIENT SURVEYS:  ABC scale 980 / 1600 = 61.3 % (50-80% = moderate level of physical functioning); <69% indicates risk for recurrent falls in PD  Score as of discharge from last PT episode - 09/07/2022: ABC scale: 1170 / 1600 = 73.1 %    TODAY'S TREATMENT:   04/27/2023 THERAPEUTIC EXERCISE: To improve strength and endurance.  Demonstration, verbal and tactile cues throughout for technique.  Rec Bike - L2 x 6 min Counter squat 10 x 3" Standing alt hip ABD with looped GTB at thighs x 10 bil Standing alt hip extension with looped GTB at thighs x 10 bil Standing alt hip ABD/ER clam with looped GTB at thighs x 10 bil  NEUROMUSCULAR RE-EDUCATION: To improve balance, coordination, kinesthesia, posture, proprioception, reduce fall risk, amplitude  of movement, speed of movement to reduce bradykinesia, and reduce rigidity. Supine PWR! Moves - Up, [employer], Twist & Step x 10 each PWR! Sit to stand from Aire[employer] pad on mat table x 10 Counter squat + hip ABD/ER into looped GTB at distal thighs 10 x 3" B side-stepping along counter with looped GTB at distal thighs x 10 ft, with looped GTB at mid calves 2 x 10 ft   04/25/2023 THERAPEUTIC EXERCISE: To improve strength and endurance.  Demonstration, verbal and  tactile cues throughout for technique.  NuStep - L5 x 5 minutes (UE/LE to promote reciprocal movement patterns)  NEUROMUSCULAR RE-EDUCATION: To improve balance, coordination, kinesthesia, posture, proprioception, reduce fall risk, amplitude of movement, speed of movement to reduce bradykinesia, and reduce rigidity. Seated PWR! Moves - Up, Rock, Twist & Step 2 x 10 each, PT leading 1st set with pt attempting to lead 2nd set but intermittent cues/reminders necessary Standing PWR! Moves - Up, [employer], [place] & Step x 10 each   04/18/2023  PHYSICAL PERFORMANCE TEST or MEASUREMENT: Berg = 44/56; scores of 37-45 indicate a significant (>80%) risk for falls FGA = 17/30; score of <19/30 indicate a high risk for falls  Berg Balance Test   Sit to Stand Able to stand without using hands and stabilize independently    Standing Unsupported Able to stand safely 2 minutes    Sitting with Back Unsupported but Feet Supported on Floor or Stool Able to sit safely and securely 2 minutes    Stand to Sit Sits safely with minimal use of hands    Transfers Able to transfer safely, definite need of hands    Standing Unsupported with Eyes Closed Able to stand 10 seconds with supervision    Standing Unsupported with Feet Together Able to place feet together independently and stand for 1 minute with supervision    From Standing, Reach Forward with Outstretched Arm Can reach forward >12 cm safely (5")    From Standing Position, Pick up Object from Floor Able to pick up shoe safely and easily    From Standing Position, Turn to Look Behind Over each Shoulder Looks behind one side only/other side shows less weight shift    Turn 360 Degrees Able to turn 360 degrees safely but slowly    Standing Unsupported, Alternately Place Feet on Step/Stool Able to stand independently and safely and complete 8 steps in 20 seconds    Standing Unsupported, One Foot in Front Able to take small step independently and hold 30 seconds    Standing  on One Leg Tries to lift leg/unable to hold 3 seconds but remains standing independently    Total Score 44    Berg comment: 37-45 significant risk for falls(>80%)      Functional Gait  Assessment   Gait Level Surface Walks 20 ft in less than 7 sec but greater than 5.5 sec, uses assistive device, slower speed, mild gait deviations, or deviates 6-10 in outside of the 12 in walkway width.    Change in Gait Speed Able to change speed, demonstrates mild gait deviations, deviates 6-10 in outside of the 12 in walkway width, or no gait deviations, unable to achieve a major change in velocity, or uses a change in velocity, or uses an assistive device.    Gait with Horizontal Head Turns Performs head turns smoothly with slight change in gait velocity (eg, minor disruption to smooth gait path), deviates 6-10 in outside 12 in walkway width, or uses an assistive device.  Gait with Vertical Head Turns Performs task with slight change in gait velocity (eg, minor disruption to smooth gait path), deviates 6 - 10 in outside 12 in walkway width or uses assistive device    Gait and Pivot Turn Pivot turns safely within 3 sec and stops quickly with no loss of balance.    Step Over Obstacle Is able to step over one shoe box (4.5 in total height) without changing gait speed. No evidence of imbalance.    Gait with Narrow Base of Support Ambulates less than 4 steps heel to toe or cannot perform without assistance.    Gait with Eyes Closed Walks 20 ft, slow speed, abnormal gait pattern, evidence for imbalance, deviates 10-15 in outside 12 in walkway width. Requires more than 9 sec to ambulate 20 ft.    Ambulating Backwards Walks 20 ft, slow speed, abnormal gait pattern, evidence for imbalance, deviates 10-15 in outside 12 in walkway width.    Steps Alternating feet, must use rail.    Total Score 17    FGA comment: < 19 = high risk fall      THERAPEUTIC EXERCISE: To improve ROM and flexibility.  Demonstration, verbal and  tactile cues throughout for technique.  Supine HS stretch with strap 2 x 30" bil - pt notes better stretch with opp LE straight Mod thomas quad/hip flexor stretch with strap 2 x 30" on L   PATIENT EDUCATION:  Education details: continue with current HEP  Person educated: Patient Education method: Explanation Education comprehension: verbalized understanding  HOME EXERCISE PROGRAM: Access Code: [email or web address] URL: [email or web address] Date: 04/06/2023 Prepared by: Glenetta Hew  Exercises - Hip Flexor Stretch with Strap on Table  - 2 x daily - 7 x weekly - 3 reps - 30 sec hold - Hooklying Hamstring Stretch with Strap  - 2 x daily - 7 x weekly - 3 reps - 30 sec hold  PWR! Moves: Seated Standing   ASSESSMENT:  CLINICAL IMPRESSION: Kendrix Orman" reports no issues with home performance of seated and standing PWR! Moves and denies need for review today.  We proceeded with introduction of supine PWR! Moves with patient noting some increased soreness after performing moves which he attributed to moving in ways he is not used to moving.  Promoted carryover of PWR! Moves into functional mobility using seated PWR! Up to facilitate sit to stand transition but due to patient's height, had to elevate mat table height with Airex pad to achieve 90/90 hip and knee starting position as he struggled from lower height.  Remainder of session focusing on proximal LE strengthening targeting hip extension, abduction and ER to facilitate increased stability and ease of mobility.  Annette Stable will benefit from continue skilled PT to address ongoing deficits to improve mobility and activity tolerance to help reach the maximal level of functional independence with mobility and gait with reduced risk for falls.    OBJECTIVE IMPAIRMENTS: Abnormal gait, decreased activity tolerance, decreased balance, decreased coordination, decreased endurance, decreased knowledge of use of DME, decreased mobility, difficulty  walking, decreased ROM, decreased strength, decreased safety awareness, dizziness, impaired perceived functional ability, impaired flexibility, improper body mechanics, postural dysfunction, and pain.   ACTIVITY LIMITATIONS: carrying, lifting, standing, squatting, stairs, transfers, bed mobility, reach over head, locomotion level, and caring for others  PARTICIPATION LIMITATIONS: meal prep, cleaning, laundry, driving, shopping, and community activity  PERSONAL FACTORS: Age, Past/current experiences, Time since onset of injury/illness/exacerbation, and 3+ comorbidities: PPM 2 sick sinus syndrome/SA node dysfunction & complete heart block,  orthostatic hypotension, COPD, OSA, HTN, DM2, Parkinson's Disease, CVA 03/2021, hypothyroidism, PTSD, bipolar I disorder   are also affecting patient's functional outcome.   REHAB POTENTIAL: Good  CLINICAL DECISION MAKING: Evolving/moderate complexity  EVALUATION COMPLEXITY: Moderate   GOALS: Goals reviewed with patient? Yes  SHORT TERM GOALS: Target date: 05/18/2023   Complete remaining standardized balance assessment and update LTG's accordingly Baseline: Berg and FGA TBA Goal status: MET - 04/18/23  Assess need for new rollator/4WW and provide recommendations to MD for DME prescription.  Baseline:  Goal status: IN PROGRESS  Patient will be independent with initial HEP Baseline:  Goal status: IN PROGRESS  Patient will demonstrate improved B proximal LE strength to >/= 4+/5 for improved stability and ease of mobility. Baseline: Refer to above LE MMT table Goal status: IN PROGRESS  LONG TERM GOALS: Target date: 06/29/2023  Patient will be independent with ongoing/advanced HEP for self-management at home incorporating PWR! Moves as indicated .  Baseline:  Goal status: IN PROGRESS  2.  Patient will be able to ambulate at least 800 ft with LRAD including outdoor and unlevel surfaces, independently with no LOB, for improved community gait.    Baseline:  Goal status: IN PROGRESS  3.  Patient will be able to step up/down curb safely with LRAD for safety with community ambulation.  Baseline:  Goal status: IN PROGRESS   4.  Patient will demonstrate at least 4 point improvement on FGA to improve gait stability and reduce risk for falls.  Baseline: 17/30 (04/18/23) Goal status: IN PROGRESS  5.  Patient will improve Berg score by at least 8 points to improve safety and stability with ADLs in standing and reduce risk for falls.    Baseline: 44/56 (04/18/23) Goal status: IN PROGRESS  6.  Patient will report >/= 70% on ABC scale to demonstrate improved balance confidence and decreased risk for falls. Baseline: 980 / 1600 = 61.3 %  Goal status: IN PROGRESS  7. Patient will verbalize understanding of local Parkinson's disease community resources, including community fitness post d/c. Baseline:  Goal status: IN PROGRESS   PLAN:  PT FREQUENCY: 2x/week  PT DURATION: 8 weeks  PLANNED INTERVENTIONS: 97164- PT Re-evaluation, 97110-Therapeutic exercises, 97530- Therapeutic activity, 97112- Neuromuscular re-education, 97535- Self Care, 81191- Manual therapy, [phone]- Gait training, [phone]- Electrical stimulation (unattended), 97035- Ultrasound, [id number]- Ionotophoresis 4mg /ml Dexamethasone, Patient/Family education, Balance training, Stair training, Taping, Dry Needling, Joint mobilization, DME instructions, Cryotherapy, Moist heat, and 97750- Physical Performance Test or Measurement  PLAN FOR NEXT SESSION: Assess for best 4-wheel style walker and provide DME recommendation to MD; progress postural and proximal LE flexibility and core/proximal LE strengthening - update/modify HEP as indicated; progress PWR! Moves   [place], [place] 04/27/2023, 4:54 PM

## 2023-05-01 ENCOUNTER — Telehealth: Payer: Self-pay

## 2023-05-01 ENCOUNTER — Ambulatory Visit: Admitting: Physical Therapy

## 2023-05-01 DIAGNOSIS — R2681 Unsteadiness on feet: Secondary | ICD-10-CM

## 2023-05-01 DIAGNOSIS — R2689 Other abnormalities of gait and mobility: Secondary | ICD-10-CM

## 2023-05-01 DIAGNOSIS — M6281 Muscle weakness (generalized): Secondary | ICD-10-CM

## 2023-05-01 DIAGNOSIS — R293 Abnormal posture: Secondary | ICD-10-CM

## 2023-05-01 MED ORDER — CLOPIDOGREL BISULFATE 75 MG PO TABS: 75.0000 mg | ORAL_TABLET | Freq: Every day | ORAL | 0 refills | Status: DC

## 2023-05-01 NOTE — Therapy (Signed)
 OUTPATIENT PHYSICAL THERAPY TREATMENT   Patient Name: Victor Osborne MRN: [id number] DOB:Sep 16, 1932, 89 y.o., male Today's Date: 05/01/2023   END OF SESSION:  PT End of Session - 05/01/23 1135     Visit Number 5    Date for PT Re-Evaluation 06/29/23    Authorization Type Medicare & [employer]    Progress Note Due on Visit 10    PT Start Time 1135    PT Stop Time 1215    PT Time Calculation (min) 40 min    Activity Tolerance Patient tolerated treatment well    Behavior During Therapy WFL for tasks assessed/performed              Past Medical History:  Diagnosis Date   Bipolar 1 disorder (HCC)    COPD (chronic obstructive pulmonary disease) (HCC)    Depression    First degree AV block    Hypertension    pt denies but was in medical record   Hypoglycemia, unspecified    Myalgia and myositis, unspecified    Obesity    Pneumonia 1970   Skin cancer (melanoma) (HCC)    Thyroid disease    Unspecified hypothyroidism    UPJ obstruction, congenital    Past Surgical History:  Procedure Laterality Date   APPENDECTOMY  1939   malignant melanoma removed from right forehead  2008   PERMANENT PACEMAKER INSERTION N/A 06/19/2013   Procedure: PERMANENT PACEMAKER INSERTION;  Surgeon: Marinus Maw, MD;  Location: [facility] CATH LAB;  Service: Cardiovascular;  Laterality: N/A;   PPM GENERATOR CHANGEOUT N/A 09/28/2022   Procedure: PPM GENERATOR CHANGEOUT;  Surgeon: Marinus Maw, MD;  Location: [facility] INVASIVE CV LAB;  Service: Cardiovascular;  Laterality: N/A;   right big toe  surgery  2009   TRANSURETHRAL RESECTION OF PROSTATE  02/04/2011   Procedure: TRANSURETHRAL RESECTION OF THE PROSTATE (TURP);  Surgeon: Crecencio Mc, MD;  Location: [facility] ORS;  Service: Urology;  Laterality: N/A;   URETHRA SURGERY  2010   reconstruction   Patient Active Problem List   Diagnosis Date Noted   Hyperlipidemia 03/15/2022   TIA (transient ischemic attack) 03/14/2022   History of neoplasm 04/12/2021    Melanocytic nevi of trunk 04/12/2021   Other seborrheic keratosis 04/12/2021   Cerebral embolism with cerebral infarction 03/31/2021   Left leg weakness 03/30/2021   Chronic post-traumatic stress disorder 12/05/2017   Bipolar I disorder, current or most recent episode depressed, in partial remission (HCC) 12/05/2017   CHB (complete heart block) (HCC) 01/27/2017   HTN (hypertension) 01/27/2017   Hypothyroidism 10/11/2016   Parkinson disease (HCC) 09/08/2016   Chronic osteoarthritis 03/20/2015   Bipolar 1 disorder (HCC) 03/20/2015   Tremor 03/20/2015   Pacemaker 09/24/2013   SSS (sick sinus syndrome) (HCC) 06/19/2013   First degree AV block 04/09/2013   Allergic rhinitis 03/17/2013   Obstructive sleep apnea 12/18/2006   COPD mixed type (HCC) 12/18/2006   History of malignant melanoma of skin 12/18/2006   Diabetes mellitus (HCC) 12/18/2006    PCP: Olive Bass, FNP   REFERRING PROVIDER: Vladimir Faster, DO   REFERRING DIAG: G20.A1 (ICD-10-CM) - Parkinson's disease without dyskinesia or fluctuating manifestations (HCC)   THERAPY DIAG:  Other abnormalities of gait and mobility  Unsteadiness on feet  Muscle weakness (generalized)  Abnormal posture  RATIONALE FOR EVALUATION AND TREATMENT: Rehabilitation  ONSET DATE: Initial PD diagnosis >15 yrs ago, worsening symptoms over past few months  NEXT MD VISIT: 08/24/2023   SUBJECTIVE:  SUBJECTIVE STATEMENT: Pt states PWR! Moves were good. "They seem to loosen me up." Has been doing them some at home.   EVAL:  Pt reports he feels that he needs to use the rollator more now than previously and notes more effort when tries to go without the walker. He would like to use a 4WW with bigger wheels - he found one that needed fixing  up and he has been using this, but would prefer a new one.  He feels like his legs are getting weaker and his activity tolerance is decreasing.  As of our PD screen on 02/21/23, He noted he was starting to feel the effects of the Parkinson's more - dizziness upon coming to standing and feeling weaker. Standing for ADLs cause him more fatigue than they used to, requiring seated rest breaks. He noted increased difficulty with UB dressing when trying to don a jacket. Walking requires more effort and he tires more easily. Feels more unsteady on his feet.   Pt accompanied by: self  PAIN: Are you having pain? Yes: NPRS scale: ~4/10 when getting up (depends on time of day) Pain location: B anterior thighs  Pain description: soreness  Aggravating factors: sit to stand transfers  Relieving factors: not indicated (resolves upon completion of motion)   PERTINENT HISTORY:  PPM 2 sick sinus syndrome/SA node dysfunction & complete heart block, orthostatic hypotension, COPD, OSA, HTN, DM2, Parkinson's Disease, CVA 03/2021, hypothyroidism, PTSD, bipolar I disorder   PRECAUTIONS: Fall and ICD/Pacemaker  RED FLAGS: None  WEIGHT BEARING RESTRICTIONS: No  FALLS:  Has patient fallen in last 6 months? No  LIVING ENVIRONMENT: Lives with: lives alone and with his dog Lives in: Other Independent living facility (3rd floor) Stairs:  Elevator Has following equipment at home: Single point cane, Environmental consultant - 4 wheeled, and hiking pole  OCCUPATION: Retired  PLOF: Independent and Leisure: being outdoors - walking his dog, and stays active most of the day  PATIENT GOALS: "To be better prepared for balance issues and be able to adjust to avoid falling."   OBJECTIVE: (objective measures completed at initial evaluation unless otherwise dated)  DIAGNOSTIC FINDINGS:  03/14/22 & 03/15/22 - CT Head w/o contrast:  IMPRESSION: 1. No evidence of acute intracranial abnormality. 2. Chronic small vessel ischemic disease and  cerebral atrophy.  03/14/22 - CT Angio Head & Neck: IMPRESSION: 1. Negative CTA for large vessel occlusion or other emergent finding. 2. Mild for age atheromatous change about the carotid bifurcations and carotid siphons without hemodynamically significant stenosis.  COGNITION: Overall cognitive status: Within functional limits for tasks assessed   SENSATION: WFL Except neuropathy in his feet - most notable at night  COORDINATION: Gross motor WFL B UE/LE   POSTURE:  rounded shoulders, forward head, and flexed trunk   MUSCLE LENGTH: Hamstrings: mod tight B ITB: mild/mod tight B Piriformis: mild/mod tight B Hip flexors: mod/severe tight B Quads: mod tight B Heelcord: mod tight L, mild tight R   LOWER EXTREMITY ROM:    Grossly WFL with exceptions of limitation related to muscle tightness as above  LOWER EXTREMITY MMT:    MMT Right eval Left eval  Hip flexion 4+ 4+  Hip extension 4 ^ 4 ^  Hip abduction 4- 4-  Hip adduction 4 4  Hip internal rotation 4+ 4+  Hip external rotation 4 4  Knee flexion 5 5  Knee extension 4+ 5  Ankle dorsiflexion 5 4+  Ankle plantarflexion    Ankle inversion  Ankle eversion    (Blank rows = not tested, ^ - limited ROM)  BED MOBILITY:  Sit to supine Modified independence Supine to sit Modified independence Rolling to Right Modified independence  TRANSFERS: Assistive device utilized: None  Sit to stand: Complete Independence Stand to sit: Complete Independence Chair to chair:    Floor:     GAIT: Distance walked: clinic distances Assistive device utilized: Single point cane Level of assistance: Modified independence Gait pattern: step through pattern, decreased arm swing- Right, decreased arm swing- Left, and trunk flexed Comments: Pt reports he has been using his 4WW/rollator more commonly, especially outdoors  STAIRS:  Level of Assistance:     Stair Negotiation Technique:   Number of Stairs:    Height of Stairs:    Comments:   FUNCTIONAL TESTS:  5 times sit to stand: 13.69 sec w/o UE assist  Timed up and go (TUG): 10.78 sec (Normal), 10.09 sec (Manual), 12.72 sec (Cognitive - unable to complete cognitive task w/o multiple errors) 10 meter walk test: 9.00 sec w/o AD; Gait speed = 3.64 ft/sec  Berg Balance Scale: 44/56; 37-45 significant (>80%) risk for falls (04/18/23)  Dynamic Gait Index: 17/30; < 19 = high risk fall (04/18/23)  Scores as of discharge from last PT episode - 09/07/2022: 5xSTS = 12.87 sec w/o UE assist TUG: Normal = 8.34 sec w/o AD; Manual = 9.16 sec; Cognitive = 14.06 sec : 6.44 sec w/o AD Gait speed: 5.09 ft/sec w/o AD Berg = 52/56; 52-55 lower fall risk (> 25%) FGA = 25/30; 25-28 = low risk fall   PATIENT SURVEYS:  ABC scale 980 / 1600 = 61.3 % (50-80% = moderate level of physical functioning); <69% indicates risk for recurrent falls in PD  Score as of discharge from last PT episode - 09/07/2022: ABC scale: 1170 / 1600 = 73.1 %    TODAY'S TREATMENT:  05/01/2023 THERAPEUTIC EXERCISE: Nustep L6 x 6 min UEs/LEs  NEUROMUSCULAR RE-EDUCATION: Supine PWR!Moves - up, rock, twist & step 2x10 each with boom whackers Sitting PWR!Moves - up, rock, twist & step 2x10 each with boom whackers Against wall twist for balance and upright posture using boom whackers 2x10 Against wall side to side step with arms out stretched Ambulation: normal gait with SPC x 1 lap, "walk like you're walking over mud puddles" x 1 lap, backwards walking x 1/2 lap  04/27/2023 THERAPEUTIC EXERCISE: To improve strength and endurance.  Demonstration, verbal and tactile cues throughout for technique.  Rec Bike - L2 x 6 min Counter squat 10 x 3" Standing alt hip ABD with looped GTB at thighs x 10 bil Standing alt hip extension with looped GTB at thighs x 10 bil Standing alt hip ABD/ER clam with looped GTB at thighs x 10 bil  NEUROMUSCULAR RE-EDUCATION: To improve balance, coordination, kinesthesia, posture,  proprioception, reduce fall risk, amplitude of movement, speed of movement to reduce bradykinesia, and reduce rigidity. Supine PWR! Moves - Up, [employer], Twist & Step x 10 each PWR! Sit to stand from [employer] pad on mat table x 10 Counter squat + hip ABD/ER into looped GTB at distal thighs 10 x 3" B side-stepping along counter with looped GTB at distal thighs x 10 ft, with looped GTB at mid calves 2 x 10 ft   04/25/2023 THERAPEUTIC EXERCISE: To improve strength and endurance.  Demonstration, verbal and tactile cues throughout for technique.  NuStep - L5 x 5 minutes (UE/LE to promote reciprocal movement patterns)  NEUROMUSCULAR RE-EDUCATION: To improve balance,  coordination, kinesthesia, posture, proprioception, reduce fall risk, amplitude of movement, speed of movement to reduce bradykinesia, and reduce rigidity. Seated PWR! Moves - Up, Rock, Twist & Step 2 x 10 each, PT leading 1st set with pt attempting to lead 2nd set but intermittent cues/reminders necessary Standing PWR! Moves - Up, [employer], [place] & Step x 10 each   04/18/2023  PHYSICAL PERFORMANCE TEST or MEASUREMENT: Berg = 44/56; scores of 37-45 indicate a significant (>80%) risk for falls FGA = 17/30; score of <19/30 indicate a high risk for falls  Berg Balance Test   Sit to Stand Able to stand without using hands and stabilize independently    Standing Unsupported Able to stand safely 2 minutes    Sitting with Back Unsupported but Feet Supported on Floor or Stool Able to sit safely and securely 2 minutes    Stand to Sit Sits safely with minimal use of hands    Transfers Able to transfer safely, definite need of hands    Standing Unsupported with Eyes Closed Able to stand 10 seconds with supervision    Standing Unsupported with Feet Together Able to place feet together independently and stand for 1 minute with supervision    From Standing, Reach Forward with Outstretched Arm Can reach forward >12 cm safely (5")    From Standing Position, Pick  up Object from Floor Able to pick up shoe safely and easily    From Standing Position, Turn to Look Behind Over each Shoulder Looks behind one side only/other side shows less weight shift    Turn 360 Degrees Able to turn 360 degrees safely but slowly    Standing Unsupported, Alternately Place Feet on Step/Stool Able to stand independently and safely and complete 8 steps in 20 seconds    Standing Unsupported, One Foot in Front Able to take small step independently and hold 30 seconds    Standing on One Leg Tries to lift leg/unable to hold 3 seconds but remains standing independently    Total Score 44    Berg comment: 37-45 significant risk for falls(>80%)      Functional Gait  Assessment   Gait Level Surface Walks 20 ft in less than 7 sec but greater than 5.5 sec, uses assistive device, slower speed, mild gait deviations, or deviates 6-10 in outside of the 12 in walkway width.    Change in Gait Speed Able to change speed, demonstrates mild gait deviations, deviates 6-10 in outside of the 12 in walkway width, or no gait deviations, unable to achieve a major change in velocity, or uses a change in velocity, or uses an assistive device.    Gait with Horizontal Head Turns Performs head turns smoothly with slight change in gait velocity (eg, minor disruption to smooth gait path), deviates 6-10 in outside 12 in walkway width, or uses an assistive device.    Gait with Vertical Head Turns Performs task with slight change in gait velocity (eg, minor disruption to smooth gait path), deviates 6 - 10 in outside 12 in walkway width or uses assistive device    Gait and Pivot Turn Pivot turns safely within 3 sec and stops quickly with no loss of balance.    Step Over Obstacle Is able to step over one shoe box (4.5 in total height) without changing gait speed. No evidence of imbalance.    Gait with Narrow Base of Support Ambulates less than 4 steps heel to toe or cannot perform without assistance.    Gait with Eyes  Closed Walks 20 ft, slow speed, abnormal gait pattern, evidence for imbalance, deviates 10-15 in outside 12 in walkway width. Requires more than 9 sec to ambulate 20 ft.    Ambulating Backwards Walks 20 ft, slow speed, abnormal gait pattern, evidence for imbalance, deviates 10-15 in outside 12 in walkway width.    Steps Alternating feet, must use rail.    Total Score 17    FGA comment: < 19 = high risk fall      THERAPEUTIC EXERCISE: To improve ROM and flexibility.  Demonstration, verbal and tactile cues throughout for technique.  Supine HS stretch with strap 2 x 30" bil - pt notes better stretch with opp LE straight Mod thomas quad/hip flexor stretch with strap 2 x 30" on L   PATIENT EDUCATION:  Education details: continue with current HEP  Person educated: Patient Education method: Explanation Education comprehension: verbalized understanding  HOME EXERCISE PROGRAM: Access Code: [email or web address] URL: [email or web address] Date: 04/06/2023 Prepared by: Felecia Hopper  Exercises - Hip Flexor Stretch with Strap on Table  - 2 x daily - 7 x weekly - 3 reps - 30 sec hold - Hooklying Hamstring Stretch with Strap  - 2 x daily - 7 x weekly - 3 reps - 30 sec hold  PWR! Moves: Seated Standing   ASSESSMENT:  CLINICAL IMPRESSION: Reinhart Saulters" reports feeling loose after performing PWR! Exercises. Continued PWR! Moves in supine and seated today. Added use of boom whackers to increase movement amplitude. Incorporated a few moves in standing against wall as pt remains challenged with maintaining his upright posture. Added backwards walking today as pt states this is how he normally falls.   OBJECTIVE IMPAIRMENTS: Abnormal gait, decreased activity tolerance, decreased balance, decreased coordination, decreased endurance, decreased knowledge of use of DME, decreased mobility, difficulty walking, decreased ROM, decreased strength, decreased safety awareness, dizziness, impaired perceived  functional ability, impaired flexibility, improper body mechanics, postural dysfunction, and pain.     GOALS: Goals reviewed with patient? Yes  SHORT TERM GOALS: Target date: 05/18/2023   Complete remaining standardized balance assessment and update LTG's accordingly Baseline: Berg and FGA TBA Goal status: MET - 04/18/23  Assess need for new rollator/4WW and provide recommendations to MD for DME prescription.  Baseline:  Goal status: IN PROGRESS  Patient will be independent with initial HEP Baseline:  Goal status: IN PROGRESS  Patient will demonstrate improved B proximal LE strength to >/= 4+/5 for improved stability and ease of mobility. Baseline: Refer to above LE MMT table Goal status: IN PROGRESS  LONG TERM GOALS: Target date: 06/29/2023  Patient will be independent with ongoing/advanced HEP for self-management at home incorporating PWR! Moves as indicated .  Baseline:  Goal status: IN PROGRESS  2.  Patient will be able to ambulate at least 800 ft with LRAD including outdoor and unlevel surfaces, independently with no LOB, for improved community gait.   Baseline:  Goal status: IN PROGRESS  3.  Patient will be able to step up/down curb safely with LRAD for safety with community ambulation.  Baseline:  Goal status: IN PROGRESS   4.  Patient will demonstrate at least 4 point improvement on FGA to improve gait stability and reduce risk for falls.  Baseline: 17/30 (04/18/23) Goal status: IN PROGRESS  5.  Patient will improve Berg score by at least 8 points to improve safety and stability with ADLs in standing and reduce risk for falls.    Baseline: 44/56 (04/18/23) Goal status: IN PROGRESS  6.  Patient will  report >/= 70% on ABC scale to demonstrate improved balance confidence and decreased risk for falls. Baseline: 980 / 1600 = 61.3 %  Goal status: IN PROGRESS  7. Patient will verbalize understanding of local Parkinson's disease community resources, including community  fitness post d/c. Baseline:  Goal status: IN PROGRESS   PLAN:  PT FREQUENCY: 2x/week  PT DURATION: 8 weeks  PLANNED INTERVENTIONS: 97164- PT Re-evaluation, 97110-Therapeutic exercises, 97530- Therapeutic activity, 97112- Neuromuscular re-education, 97535- Self Care, 16109- Manual therapy, [phone]- Gait training, [phone]- Electrical stimulation (unattended), 97035- Ultrasound, 91478- Ionotophoresis 4mg /ml Dexamethasone, Patient/Family education, Balance training, Stair training, Taping, Dry Needling, Joint mobilization, DME instructions, Cryotherapy, Moist heat, and 97750- Physical Performance Test or Measurement  PLAN FOR NEXT SESSION: Assess for best 4-wheel style walker and provide DME recommendation to MD; progress postural and proximal LE flexibility and core/proximal LE strengthening - update/modify HEP as indicated; progress PWR! Moves   Chistina Roston April Ma L Bronte Kropf, PT 05/01/2023, 11:35 AM

## 2023-05-01 NOTE — Telephone Encounter (Signed)
 Rx sent

## 2023-05-01 NOTE — Telephone Encounter (Signed)
 Copied from CRM [id number]. Topic: Clinical - Prescription Issue >> May 01, 2023  3:22 PM Earnestine Goes B wrote: Reason for CRM: pt called regarding clopidogrel (PLAVIX) 75 MG tablet. States the phamacy needs a prescription. Pt states [facility] [address]

## 2023-05-03 ENCOUNTER — Ambulatory Visit: Admitting: Physical Therapy

## 2023-05-08 ENCOUNTER — Encounter: Payer: Self-pay | Admitting: Physical Therapy

## 2023-05-08 ENCOUNTER — Ambulatory Visit: Admitting: Physical Therapy

## 2023-05-08 DIAGNOSIS — M6281 Muscle weakness (generalized): Secondary | ICD-10-CM

## 2023-05-08 DIAGNOSIS — R2681 Unsteadiness on feet: Secondary | ICD-10-CM

## 2023-05-08 DIAGNOSIS — R2689 Other abnormalities of gait and mobility: Secondary | ICD-10-CM | POA: Diagnosis not present

## 2023-05-08 DIAGNOSIS — R293 Abnormal posture: Secondary | ICD-10-CM

## 2023-05-08 NOTE — Therapy (Signed)
 OUTPATIENT PHYSICAL THERAPY TREATMENT   Patient Name: Victor Osborne MRN: [id number] DOB:05-10-1932, 89 y.o., male Today's Date: 05/08/2023   END OF SESSION:  PT End of Session - 05/08/23 1149     Visit Number 6    Date for PT Re-Evaluation 06/29/23    Authorization Type Medicare & [employer]    Progress Note Due on Visit 10    PT Start Time 1149    PT Stop Time 1228    PT Time Calculation (min) 39 min    Activity Tolerance Patient tolerated treatment well;Patient limited by fatigue    Behavior During Therapy WFL for tasks assessed/performed               Past Medical History:  Diagnosis Date   Bipolar 1 disorder (HCC)    COPD (chronic obstructive pulmonary disease) (HCC)    Depression    First degree AV block    Hypertension    pt denies but was in medical record   Hypoglycemia, unspecified    Myalgia and myositis, unspecified    Obesity    Pneumonia 1970   Skin cancer (melanoma) (HCC)    Thyroid  disease    Unspecified hypothyroidism    UPJ obstruction, congenital    Past Surgical History:  Procedure Laterality Date   APPENDECTOMY  1939   malignant melanoma removed from right forehead  2008   PERMANENT PACEMAKER INSERTION N/A 06/19/2013   Procedure: PERMANENT PACEMAKER INSERTION;  Surgeon: Tammie Fall, MD;  Location: [facility] CATH LAB;  Service: Cardiovascular;  Laterality: N/A;   PPM GENERATOR CHANGEOUT N/A 09/28/2022   Procedure: PPM GENERATOR CHANGEOUT;  Surgeon: Tammie Fall, MD;  Location: [facility] INVASIVE CV LAB;  Service: Cardiovascular;  Laterality: N/A;   right big toe  surgery  2009   TRANSURETHRAL RESECTION OF PROSTATE  02/04/2011   Procedure: TRANSURETHRAL RESECTION OF THE PROSTATE (TURP);  Surgeon: Kristeen Peto, MD;  Location: [facility] ORS;  Service: Urology;  Laterality: N/A;   URETHRA SURGERY  2010   reconstruction   Patient Active Problem List   Diagnosis Date Noted   Hyperlipidemia 03/15/2022   TIA (transient ischemic attack) 03/14/2022    History of neoplasm 04/12/2021   Melanocytic nevi of trunk 04/12/2021   Other seborrheic keratosis 04/12/2021   Cerebral embolism with cerebral infarction 03/31/2021   Left leg weakness 03/30/2021   Chronic post-traumatic stress disorder 12/05/2017   Bipolar I disorder, current or most recent episode depressed, in partial remission (HCC) 12/05/2017   CHB (complete heart block) (HCC) 01/27/2017   HTN (hypertension) 01/27/2017   Hypothyroidism 10/11/2016   Parkinson disease (HCC) 09/08/2016   Chronic osteoarthritis 03/20/2015   Bipolar 1 disorder (HCC) 03/20/2015   Tremor 03/20/2015   Pacemaker 09/24/2013   SSS (sick sinus syndrome) (HCC) 06/19/2013   First degree AV block 04/09/2013   Allergic rhinitis 03/17/2013   Obstructive sleep apnea 12/18/2006   COPD mixed type (HCC) 12/18/2006   History of malignant melanoma of skin 12/18/2006   Diabetes mellitus (HCC) 12/18/2006    PCP: Adra Alanis, FNP   REFERRING PROVIDER: Shirline Dover, DO   REFERRING DIAG: G20.A1 (ICD-10-CM) - Parkinson's disease without dyskinesia or fluctuating manifestations (HCC)   THERAPY DIAG:  Other abnormalities of gait and mobility  Unsteadiness on feet  Muscle weakness (generalized)  Abnormal posture  RATIONALE FOR EVALUATION AND TREATMENT: Rehabilitation  ONSET DATE: Initial PD diagnosis >15 yrs ago, worsening symptoms over past few months  NEXT MD VISIT: 08/24/2023  SUBJECTIVE:                                                                                                                                                                                                         SUBJECTIVE STATEMENT: Pt reports increased muscle soreness around his knees and hips since last session.   EVAL:  Pt reports he feels that he needs to use the rollator more now than previously and notes more effort when tries to go without the walker. He would like to use a 4WW with bigger wheels - he found one  that needed fixing up and he has been using this, but would prefer a new one.  He feels like his legs are getting weaker and his activity tolerance is decreasing.  As of our PD screen on 02/21/23, He noted he was starting to feel the effects of the Parkinson's more - dizziness upon coming to standing and feeling weaker. Standing for ADLs cause him more fatigue than they used to, requiring seated rest breaks. He noted increased difficulty with UB dressing when trying to don a jacket. Walking requires more effort and he tires more easily. Feels more unsteady on his feet.   Pt accompanied by: self  PAIN: Are you having pain? Yes: NPRS scale: ~4/10 when getting up (depends on time of day) Pain location: B anterior thighs  Pain description: soreness  Aggravating factors: sit to stand transfers  Relieving factors: not indicated (resolves upon completion of motion)   PERTINENT HISTORY:  PPM 2 sick sinus syndrome/SA node dysfunction & complete heart block, orthostatic hypotension, COPD, OSA, HTN, DM2, Parkinson's Disease, CVA 03/2021, hypothyroidism, PTSD, bipolar I disorder   PRECAUTIONS: Fall and ICD/Pacemaker  RED FLAGS: None  WEIGHT BEARING RESTRICTIONS: No  FALLS:  Has patient fallen in last 6 months? No  LIVING ENVIRONMENT: Lives with: lives alone and with his dog Lives in: Other Independent living facility (3rd floor) Stairs:  Elevator Has following equipment at home: Single point cane, Environmental consultant - 4 wheeled, and hiking pole  OCCUPATION: Retired  PLOF: Independent and Leisure: being outdoors - walking his dog, and stays active most of the day  PATIENT GOALS: "To be better prepared for balance issues and be able to adjust to avoid falling."   OBJECTIVE: (objective measures completed at initial evaluation unless otherwise dated)  DIAGNOSTIC FINDINGS:  03/14/22 & 03/15/22 - CT Head w/o contrast:  IMPRESSION: 1. No evidence of acute intracranial abnormality. 2. Chronic small vessel  ischemic disease and cerebral atrophy.  03/14/22 - CT Angio Head & Neck: IMPRESSION:  1. Negative CTA for large vessel occlusion or other emergent finding. 2. Mild for age atheromatous change about the carotid bifurcations and carotid siphons without hemodynamically significant stenosis.  COGNITION: Overall cognitive status: Within functional limits for tasks assessed   SENSATION: WFL Except neuropathy in his feet - most notable at night  COORDINATION: Gross motor WFL B UE/LE   POSTURE:  rounded shoulders, forward head, and flexed trunk   MUSCLE LENGTH: Hamstrings: mod tight B ITB: mild/mod tight B Piriformis: mild/mod tight B Hip flexors: mod/severe tight B Quads: mod tight B Heelcord: mod tight L, mild tight R   LOWER EXTREMITY ROM:    Grossly WFL with exceptions of limitation related to muscle tightness as above  LOWER EXTREMITY MMT:    MMT Right eval Left eval  Hip flexion 4+ 4+  Hip extension 4 ^ 4 ^  Hip abduction 4- 4-  Hip adduction 4 4  Hip internal rotation 4+ 4+  Hip external rotation 4 4  Knee flexion 5 5  Knee extension 4+ 5  Ankle dorsiflexion 5 4+  Ankle plantarflexion    Ankle inversion    Ankle eversion    (Blank rows = not tested, ^ - limited ROM)  BED MOBILITY:  Sit to supine Modified independence Supine to sit Modified independence Rolling to Right Modified independence  TRANSFERS: Assistive device utilized: None  Sit to stand: Complete Independence Stand to sit: Complete Independence Chair to chair:    Floor:     GAIT: Distance walked: clinic distances Assistive device utilized: Single point cane Level of assistance: Modified independence Gait pattern: step through pattern, decreased arm swing- Right, decreased arm swing- Left, and trunk flexed Comments: Pt reports he has been using his 4WW/rollator more commonly, especially outdoors  STAIRS:  Level of Assistance:     Psychologist, counselling Technique:   Number of Stairs:    Height  of Stairs:   Comments:   FUNCTIONAL TESTS:  5 times sit to stand: 13.69 sec w/o UE assist  Timed up and go (TUG): 10.78 sec (Normal), 10.09 sec (Manual), 12.72 sec (Cognitive - unable to complete cognitive task w/o multiple errors) 10 meter walk test: 9.00 sec w/o AD; Gait speed = 3.64 ft/sec  Berg Balance Scale: 44/56; 37-45 significant (>80%) risk for falls (04/18/23)  Dynamic Gait Index: 17/30; < 19 = high risk fall (04/18/23)  04/18/2023  Berg = 44/56; scores of 37-45 indicate a significant (>80%) risk for falls FGA = 17/30; score of <19/30 indicate a high risk for falls  Berg Balance Test   Sit to Stand Able to stand without using hands and stabilize independently    Standing Unsupported Able to stand safely 2 minutes    Sitting with Back Unsupported but Feet Supported on Floor or Stool Able to sit safely and securely 2 minutes    Stand to Sit Sits safely with minimal use of hands    Transfers Able to transfer safely, definite need of hands    Standing Unsupported with Eyes Closed Able to stand 10 seconds with supervision    Standing Unsupported with Feet Together Able to place feet together independently and stand for 1 minute with supervision    From Standing, Reach Forward with Outstretched Arm Can reach forward >12 cm safely (5")    From Standing Position, Pick up Object from Floor Able to pick up shoe safely and easily    From Standing Position, Turn to Look Behind Over each Shoulder Looks behind one side only/other side  shows less weight shift    Turn 360 Degrees Able to turn 360 degrees safely but slowly    Standing Unsupported, Alternately Place Feet on Step/Stool Able to stand independently and safely and complete 8 steps in 20 seconds    Standing Unsupported, One Foot in Front Able to take small step independently and hold 30 seconds    Standing on One Leg Tries to lift leg/unable to hold 3 seconds but remains standing independently    Total Score 44    Berg comment: 37-45  significant risk for falls(>80%)      Functional Gait  Assessment   Gait Level Surface Walks 20 ft in less than 7 sec but greater than 5.5 sec, uses assistive device, slower speed, mild gait deviations, or deviates 6-10 in outside of the 12 in walkway width.    Change in Gait Speed Able to change speed, demonstrates mild gait deviations, deviates 6-10 in outside of the 12 in walkway width, or no gait deviations, unable to achieve a major change in velocity, or uses a change in velocity, or uses an assistive device.    Gait with Horizontal Head Turns Performs head turns smoothly with slight change in gait velocity (eg, minor disruption to smooth gait path), deviates 6-10 in outside 12 in walkway width, or uses an assistive device.    Gait with Vertical Head Turns Performs task with slight change in gait velocity (eg, minor disruption to smooth gait path), deviates 6 - 10 in outside 12 in walkway width or uses assistive device    Gait and Pivot Turn Pivot turns safely within 3 sec and stops quickly with no loss of balance.    Step Over Obstacle Is able to step over one shoe box (4.5 in total height) without changing gait speed. No evidence of imbalance.    Gait with Narrow Base of Support Ambulates less than 4 steps heel to toe or cannot perform without assistance.    Gait with Eyes Closed Walks 20 ft, slow speed, abnormal gait pattern, evidence for imbalance, deviates 10-15 in outside 12 in walkway width. Requires more than 9 sec to ambulate 20 ft.    Ambulating Backwards Walks 20 ft, slow speed, abnormal gait pattern, evidence for imbalance, deviates 10-15 in outside 12 in walkway width.    Steps Alternating feet, must use rail.    Total Score 17    FGA comment: < 19 = high risk fall      Scores as of discharge from last PT episode - 09/07/2022: 5xSTS = 12.87 sec w/o UE assist TUG: Normal = 8.34 sec w/o AD; Manual = 9.16 sec; Cognitive = 14.06 sec : 6.44 sec w/o AD Gait speed: 5.09 ft/sec  w/o AD Berg = 52/56; 52-55 lower fall risk (> 25%) FGA = 25/30; 25-28 = low risk fall   PATIENT SURVEYS:  ABC scale 980 / 1600 = 61.3 % (50-80% = moderate level of physical functioning); <69% indicates risk for recurrent falls in PD  Score as of discharge from last PT episode - 09/07/2022: ABC scale: 1170 / 1600 = 73.1 %    TODAY'S TREATMENT:   05/08/2023  THERAPEUTIC EXERCISE: To improve strength and endurance.  Demonstration, verbal and tactile cues throughout for technique.  NuStep - L5 x 6 minutes (UE/LE to promote reciprocal movement patterns)  NEUROMUSCULAR RE-EDUCATION: To improve balance, coordination, kinesthesia, posture, proprioception, reduce fall risk, amplitude of movement, speed of movement to reduce bradykinesia, and reduce rigidity. Quadruped/All 4's PWR! Moves -  Up, Rock, Twist & Step x 10 each (modified in tall kneeling with UE's supported on chair)  PWR! Alt forward step x 6-7 - intermittent UE support on counter for balance  PWR! Alt backward step x 6-7 - intermittent UE support on counter for balance PWR! Unilateral step through fwd and back with reciprocal arm swing x 8, contralateral UE support on counter for balance    05/01/2023 THERAPEUTIC EXERCISE: Nustep L6 x 6 min UEs/LEs  NEUROMUSCULAR RE-EDUCATION: Supine PWR!Moves - up, rock, twist & step 2x10 each with boom whackers Sitting PWR!Moves - up, rock, twist & step 2x10 each with boom whackers Against wall twist for balance and upright posture using boom whackers 2x10 Against wall side to side step with arms out stretched Ambulation: normal gait with SPC x 1 lap, "walk like you're walking over mud puddles" x 1 lap, backwards walking x 1/2 lap   04/27/2023 THERAPEUTIC EXERCISE: To improve strength and endurance.  Demonstration, verbal and tactile cues throughout for technique.  Rec Bike - L2 x 6 min Counter squat 10 x 3" Standing alt hip ABD with looped GTB at thighs x 10 bil Standing alt hip extension  with looped GTB at thighs x 10 bil Standing alt hip ABD/ER clam with looped GTB at thighs x 10 bil  NEUROMUSCULAR RE-EDUCATION: To improve balance, coordination, kinesthesia, posture, proprioception, reduce fall risk, amplitude of movement, speed of movement to reduce bradykinesia, and reduce rigidity. Supine PWR! Moves - Up, [employer], Twist & Step x 10 each PWR! Sit to stand from [employer] pad on mat table x 10 Counter squat + hip ABD/ER into looped GTB at distal thighs 10 x 3" B side-stepping along counter with looped GTB at distal thighs x 10 ft, with looped GTB at mid calves 2 x 10 ft   PATIENT EDUCATION:  Education details: continue with current HEP  Person educated: Patient Education method: Explanation Education comprehension: verbalized understanding  HOME EXERCISE PROGRAM: Access Code: [email or web address] URL: [email or web address] Date: 04/06/2023 Prepared by: Felecia Hopper  Exercises - Hip Flexor Stretch with Strap on Table  - 2 x daily - 7 x weekly - 3 reps - 30 sec hold - Hooklying Hamstring Stretch with Strap  - 2 x daily - 7 x weekly - 3 reps - 30 sec hold  PWR! Moves: Seated Standing   ASSESSMENT:  CLINICAL IMPRESSION: Romin Divita" reports increased muscle soreness since last visit but feels that this is good for him.  Introduced modified PWR! Moves in quadruped/all 4's using chair to reduce degree of UE weightbearing.  Some dizziness reported on return to sitting in chair from quadruped but resolved with rest and bottle of water . Continued focus on posture and stability with forward and backward stepping but patient fatiguing quickly today requiring more intermittent rest breaks.  Raenette Bumps will benefit from continued skilled PT to address ongoing strength, posture and motor control deficits to improve mobility and activity tolerance with decreased risk for falls.   OBJECTIVE IMPAIRMENTS: Abnormal gait, decreased activity tolerance, decreased balance, decreased  coordination, decreased endurance, decreased knowledge of use of DME, decreased mobility, difficulty walking, decreased ROM, decreased strength, decreased safety awareness, dizziness, impaired perceived functional ability, impaired flexibility, improper body mechanics, postural dysfunction, and pain.     GOALS: Goals reviewed with patient? Yes  SHORT TERM GOALS: Target date: 05/18/2023   Complete remaining standardized balance assessment and update LTG's accordingly Baseline: Berg and FGA TBA Goal status: MET - 04/18/23  Assess need for new rollator/4WW and provide  recommendations to MD for DME prescription.  Baseline:  Goal status: IN PROGRESS - 05/08/23 - pt reports his 4WW customization is in "limbo" and has not been able to bring walker for PT to assess  Patient will be independent with initial HEP Baseline:  Goal status: IN PROGRESS  Patient will demonstrate improved B proximal LE strength to >/= 4+/5 for improved stability and ease of mobility. Baseline: Refer to above LE MMT table Goal status: IN PROGRESS  LONG TERM GOALS: Target date: 06/29/2023  Patient will be independent with ongoing/advanced HEP for self-management at home incorporating PWR! Moves as indicated .  Baseline:  Goal status: IN PROGRESS  2.  Patient will be able to ambulate at least 800 ft with LRAD including outdoor and unlevel surfaces, independently with no LOB, for improved community gait.   Baseline:  Goal status: IN PROGRESS  3.  Patient will be able to step up/down curb safely with LRAD for safety with community ambulation.  Baseline:  Goal status: IN PROGRESS   4.  Patient will demonstrate at least 4 point improvement on FGA to improve gait stability and reduce risk for falls.  Baseline: 17/30 (04/18/23) Goal status: IN PROGRESS  5.  Patient will improve Berg score by at least 8 points to improve safety and stability with ADLs in standing and reduce risk for falls.    Baseline: 44/56 (04/18/23) Goal  status: IN PROGRESS  6.  Patient will report >/= 70% on ABC scale to demonstrate improved balance confidence and decreased risk for falls. Baseline: 980 / 1600 = 61.3 %  Goal status: IN PROGRESS  7. Patient will verbalize understanding of local Parkinson's disease community resources, including community fitness post d/c. Baseline:  Goal status: IN PROGRESS   PLAN:  PT FREQUENCY: 2x/week  PT DURATION: 8 weeks  PLANNED INTERVENTIONS: 97164- PT Re-evaluation, 97110-Therapeutic exercises, 97530- Therapeutic activity, 97112- Neuromuscular re-education, 97535- Self Care, 16109- Manual therapy, [phone]- Gait training, [phone]- Electrical stimulation (unattended), 97035- Ultrasound, 91478- Ionotophoresis 4mg /ml Dexamethasone, Patient/Family education, Balance training, Stair training, Taping, Dry Needling, Joint mobilization, DME instructions, Cryotherapy, Moist heat, and 97750- Physical Performance Test or Measurement  PLAN FOR NEXT SESSION: Assess for best 4-wheel style walker and provide DME recommendation to MD; progress postural and proximal LE flexibility and core/proximal LE strengthening - update/modify HEP as indicated; progress PWR! Moves   [place], [place] 05/08/2023, 12:47 PM

## 2023-05-09 ENCOUNTER — Ambulatory Visit: Payer: Medicare Other | Admitting: Physician Assistant

## 2023-05-09 ENCOUNTER — Ambulatory Visit: Payer: Medicare Other | Admitting: Psychiatry

## 2023-05-09 ENCOUNTER — Other Ambulatory Visit: Payer: Self-pay | Admitting: Family

## 2023-05-09 ENCOUNTER — Encounter: Payer: Self-pay | Admitting: Physician Assistant

## 2023-05-09 DIAGNOSIS — G20A1 Parkinson's disease without dyskinesia, without mention of fluctuations: Secondary | ICD-10-CM

## 2023-05-09 DIAGNOSIS — F4312 Post-traumatic stress disorder, chronic: Secondary | ICD-10-CM | POA: Diagnosis not present

## 2023-05-09 DIAGNOSIS — F3175 Bipolar disorder, in partial remission, most recent episode depressed: Secondary | ICD-10-CM

## 2023-05-09 DIAGNOSIS — F22 Delusional disorders: Secondary | ICD-10-CM | POA: Diagnosis not present

## 2023-05-09 MED ORDER — CARBAMAZEPINE 200 MG PO TABS
200.0000 mg | ORAL_TABLET | Freq: Three times a day (TID) | ORAL | 1 refills | Status: AC
Start: 2023-05-09 — End: ?

## 2023-05-09 MED ORDER — QUETIAPINE FUMARATE 25 MG PO TABS: 25.0000 mg | ORAL_TABLET | Freq: Every day | ORAL | 1 refills | Status: DC

## 2023-05-09 MED ORDER — FLUOXETINE HCL 10 MG PO CAPS: 10.0000 mg | ORAL_CAPSULE | Freq: Every day | ORAL | 1 refills | Status: DC

## 2023-05-09 MED ORDER — CLOPIDOGREL BISULFATE 75 MG PO TABS: 75.0000 mg | ORAL_TABLET | Freq: Every day | ORAL | 3 refills | Status: AC

## 2023-05-09 NOTE — Progress Notes (Signed)
 [facility]  Patient ID: Victor Osborne,  MRN: [id number]  PCP: Adra Alanis, FNP  Date of Evaluation: 05/09/2023 Time spent:30 minutes  Chief Complaint:  Chief Complaint   Depression; Paranoid; Follow-up    HISTORY/CURRENT STATUS: HPI former patient of Roselyn Connor, NP who is no longer with our practice.  States he's paranoid more than he was, and more than he would like to be.  He's afraid that someone he knows is going to talk him into a scam, stealing his money. "I know these people and they wouldn't do anything like that. I know it's my paranoia, and I'm able to talk myself down, knowing that nothing like that will happen." States it's exhausting though. Other than that, he's doing well.   Patient is able to enjoy things.  Energy and motivation are good. 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  No extreme sadness, tearfulness, or feelings of hopelessness.  Sleeps well most of the time. ADLs and personal hygiene are normal.   Denies any changes in concentration, making decisions, or remembering things.  Appetite has not changed.  Weight is stable.  No complaints of anxiety.  Denies suicidal or homicidal thoughts.  Patient denies increased energy with decreased need for sleep, increased talkativeness, racing thoughts, impulsivity or risky behaviors, increased spending, increased libido, grandiosity, increased irritability or anger, paranoia, or hallucinations.  Review of Systems  Constitutional: Negative.   HENT: Negative.    Eyes: Negative.   Respiratory: Negative.    Cardiovascular: Negative.   Gastrointestinal: Negative.   Genitourinary: Negative.   Musculoskeletal: Negative.   Skin: Negative.   Neurological:        Parkinson's  Endo/Heme/Allergies: Negative.   Psychiatric/Behavioral:         See HPI    Individual Medical History/ Review of Systems: Changes? :No   Past Psychiatric Medication  Trials: Carbamazepine  Lithium Prozac  Remeron Seroquel  XR Ambien   Allergies: Patient has no known allergies.  Current Medications:  Current Outpatient Medications:    acetaminophen  (TYLENOL ) 325 MG tablet, Take 650 mg by mouth every 6 (six) hours as needed for mild pain or headache., Disp: , Rfl:    alclomethasone (ACLOVATE) 0.05 % ointment, Apply 1 application topically 2 (two) times daily as needed (for rash)., Disp: , Rfl:    carbidopa -levodopa  (SINEMET  IR) 25-100 MG tablet, TAKE 2 TABLETS BY MOUTH THREE TIMES DAILY, Disp: 540 tablet, Rfl: 0   cholecalciferol  (VITAMIN D) 1000 UNITS tablet, Take 2,000 Units by mouth daily., Disp: , Rfl:    clopidogrel  (PLAVIX ) 75 MG tablet, Take 1 tablet (75 mg total) by mouth daily., Disp: 90 tablet, Rfl: 3   Coenzyme Q10 (CO Q 10 PO), Take 1 capsule by mouth daily., Disp: , Rfl:    ferrous sulfate  324 (65 Fe) MG TBEC, Take 324 mg by mouth daily., Disp: , Rfl:    Flaxseed, Linseed, (FLAX SEED OIL) 1300 MG CAPS, Take 1 capsule by mouth daily., Disp: , Rfl:    Fluticasone -Umeclidin-Vilant (TRELEGY ELLIPTA ) 100-62.5-25 MCG/ACT AEPB, Inhale 1 puff into the lungs daily., Disp: 1 each, Rfl: 11   levothyroxine  (SYNTHROID ) 125 MCG tablet, TAKE 1 TABLET(125 MCG) BY MOUTH DAILY BEFORE BREAKFAST, Disp: 90 tablet, Rfl: 0   MAGNESIUM PO, Take 1 capsule by mouth daily., Disp: , Rfl:    Multiple Vitamin (MULITIVITAMIN WITH MINERALS) TABS, Take 1 tablet by mouth daily., Disp: , Rfl:    Omega-3 Fatty Acids (OMEGA-3 CF PO), Take 1,200 mg by mouth daily., Disp: , Rfl:  POTASSIUM GLUCONATE PO, Take 1 capsule by mouth daily., Disp: , Rfl:    pravastatin  (PRAVACHOL ) 80 MG tablet, Take 1 tablet (80 mg total) by mouth daily., Disp: 90 tablet, Rfl: 1   QUEtiapine  (SEROQUEL  XR) 50 MG TB24 24 hr tablet, Take 1 tablet (50 mg total) by mouth at bedtime., Disp: 90 tablet, Rfl: 1   QUEtiapine  (SEROQUEL ) 25 MG tablet, Take 1 tablet (25 mg total) by mouth at bedtime. Take with  Seroquel  XR 50 mg., Disp: 90 tablet, Rfl: 1   tamsulosin  (FLOMAX ) 0.4 MG CAPS capsule, Take 0.4 mg by mouth at bedtime., Disp: , Rfl:    carbamazepine  (TEGRETOL ) 200 MG tablet, Take 1 tablet (200 mg total) by mouth 3 (three) times daily., Disp: 90 tablet, Rfl: 1   FLUoxetine  (PROZAC ) 10 MG capsule, Take 1 capsule (10 mg total) by mouth daily., Disp: 90 capsule, Rfl: 1 Medication Side Effects: none  Family Medical/ Social History: Changes? No  MENTAL HEALTH EXAM:  There were no vitals taken for this visit.There is no height or weight on file to calculate BMI.  General Appearance: Casual and Well Groomed  Eye Contact:  Good  Speech:  Clear and Coherent and Normal Rate  Volume:  Normal  Mood:  Euthymic  Affect:  Congruent  Thought Process:  Goal Directed and Descriptions of Associations: Circumstantial  Orientation:  Full (Time, Place, and Person)  Thought Content: Logical   Suicidal Thoughts:  No  Homicidal Thoughts:  No  Memory:  WNL  Judgement:  Good  Insight:  Good  Psychomotor Activity:   With a cane, no tremor visible  Concentration:  Concentration: Good  Recall:  Good  Fund of Knowledge: Good  Language: Good  Assets:  Communication Skills Desire for Improvement Financial Resources/Insurance Housing Resilience Transportation  ADL's:  Intact  Cognition: WNL  Prognosis:  Good   Labs 03/03/2023 CBC nl CMP nl CBZ level 8.2  DIAGNOSES:    ICD-10-CM   1. Paranoia (HCC)  F22     2. Bipolar I disorder, current or most recent episode depressed, in partial remission (HCC)  F31.75 carbamazepine  (TEGRETOL ) 200 MG tablet    FLUoxetine  (PROZAC ) 10 MG capsule    3. Chronic post-traumatic stress disorder  F43.12 FLUoxetine  (PROZAC ) 10 MG capsule    4. Parkinson's disease, unspecified whether dyskinesia present, unspecified whether manifestations fluctuate (HCC)  G20.A1      Receiving Psychotherapy: No   RECOMMENDATIONS:   PDMP reviewed.  No controlled substances. I  provided 30 minutes of face to face time during this encounter, including time spent before and after the visit in records review, medical decision making, counseling pertinent to today's visit, and charting.   We had a long discussion about the paranoia.  This is not a new problem, he has been on Seroquel , at this dose, for years for this symptom as well as bipolar disorder.  I recommend increasing the dose but only by 25 mg.  He has Parkinson's disease and is on carbidopa /levodopa  by Dr. Winferd Hatter who is aware he is on an antipsychotic.  His last appointment was 03/03/2023.  He is comfortable with a slight increase of Seroquel .  He and I discussed the lab results from 03/03/2023.  No need to reorder those.  Continue Tegretol  200 mg, 1 p.o. 3 times daily. Continue Prozac  10 mg, 1 p.o. daily. Continue Seroquel  XR 50 mg, 1 p.o. nightly. Start Seroquel  IR 25 mg, 1 p.o. nightly. Return in 6 to 8 weeks.  Marvia Slocumb,  PA-C

## 2023-05-10 ENCOUNTER — Encounter: Payer: Self-pay | Admitting: Physical Therapy

## 2023-05-10 ENCOUNTER — Ambulatory Visit: Admitting: Physical Therapy

## 2023-05-10 DIAGNOSIS — R2689 Other abnormalities of gait and mobility: Secondary | ICD-10-CM | POA: Diagnosis not present

## 2023-05-10 DIAGNOSIS — M6281 Muscle weakness (generalized): Secondary | ICD-10-CM

## 2023-05-10 DIAGNOSIS — R2681 Unsteadiness on feet: Secondary | ICD-10-CM

## 2023-05-10 DIAGNOSIS — R293 Abnormal posture: Secondary | ICD-10-CM

## 2023-05-10 NOTE — Therapy (Signed)
 OUTPATIENT PHYSICAL THERAPY TREATMENT   Patient Name: Victor Osborne MRN: [id number] DOB:10-29-1932, 89 y.o., male Today's Date: 05/10/2023   END OF SESSION:      Past Medical History:  Diagnosis Date   Bipolar 1 disorder (HCC)    COPD (chronic obstructive pulmonary disease) (HCC)    Depression    First degree AV block    Hypertension    pt denies but was in medical record   Hypoglycemia, unspecified    Myalgia and myositis, unspecified    Obesity    Pneumonia 1970   Skin cancer (melanoma) (HCC)    Thyroid  disease    Unspecified hypothyroidism    UPJ obstruction, congenital    Past Surgical History:  Procedure Laterality Date   APPENDECTOMY  1939   malignant melanoma removed from right forehead  2008   PERMANENT PACEMAKER INSERTION N/A 06/19/2013   Procedure: PERMANENT PACEMAKER INSERTION;  Surgeon: Tammie Fall, MD;  Location: [facility] CATH LAB;  Service: Cardiovascular;  Laterality: N/A;   PPM GENERATOR CHANGEOUT N/A 09/28/2022   Procedure: PPM GENERATOR CHANGEOUT;  Surgeon: Tammie Fall, MD;  Location: [facility] INVASIVE CV LAB;  Service: Cardiovascular;  Laterality: N/A;   right big toe  surgery  2009   TRANSURETHRAL RESECTION OF PROSTATE  02/04/2011   Procedure: TRANSURETHRAL RESECTION OF THE PROSTATE (TURP);  Surgeon: Kristeen Peto, MD;  Location: [facility] ORS;  Service: Urology;  Laterality: N/A;   URETHRA SURGERY  2010   reconstruction   Patient Active Problem List   Diagnosis Date Noted   Hyperlipidemia 03/15/2022   TIA (transient ischemic attack) 03/14/2022   History of neoplasm 04/12/2021   Melanocytic nevi of trunk 04/12/2021   Other seborrheic keratosis 04/12/2021   Cerebral embolism with cerebral infarction 03/31/2021   Left leg weakness 03/30/2021   Chronic post-traumatic stress disorder 12/05/2017   Bipolar I disorder, current or most recent episode depressed, in partial remission (HCC) 12/05/2017   CHB (complete heart block) (HCC) 01/27/2017   HTN  (hypertension) 01/27/2017   Hypothyroidism 10/11/2016   Parkinson disease (HCC) 09/08/2016   Chronic osteoarthritis 03/20/2015   Bipolar 1 disorder (HCC) 03/20/2015   Tremor 03/20/2015   Pacemaker 09/24/2013   SSS (sick sinus syndrome) (HCC) 06/19/2013   First degree AV block 04/09/2013   Allergic rhinitis 03/17/2013   Obstructive sleep apnea 12/18/2006   COPD mixed type (HCC) 12/18/2006   History of malignant melanoma of skin 12/18/2006   Diabetes mellitus (HCC) 12/18/2006    PCP: Adra Alanis, FNP   REFERRING PROVIDER: Shirline Dover, DO   REFERRING DIAG: G20.A1 (ICD-10-CM) - Parkinson's disease without dyskinesia or fluctuating manifestations (HCC)   THERAPY DIAG:  No diagnosis found.  RATIONALE FOR EVALUATION AND TREATMENT: Rehabilitation  ONSET DATE: Initial PD diagnosis >15 yrs ago, worsening symptoms over past few months  NEXT MD VISIT: 08/24/2023   SUBJECTIVE:  SUBJECTIVE STATEMENT: Pt reports some soreness after introduction of quadruped PWR! Moves last visit but no increased pain.   EVAL:  Pt reports he feels that he needs to use the rollator more now than previously and notes more effort when tries to go without the walker. He would like to use a 4WW with bigger wheels - he found one that needed fixing up and he has been using this, but would prefer a new one.  He feels like his legs are getting weaker and his activity tolerance is decreasing.  As of our PD screen on 02/21/23, He noted he was starting to feel the effects of the Parkinson's more - dizziness upon coming to standing and feeling weaker. Standing for ADLs cause him more fatigue than they used to, requiring seated rest breaks. He noted increased difficulty with UB dressing when trying to don a jacket.  Walking requires more effort and he tires more easily. Feels more unsteady on his feet.   Pt accompanied by: self  PAIN: Are you having pain? No and Yes: NPRS scale: 0/10 currently, up to ~4-5/10 when getting up (depends on time of day) Pain location: B anterior thighs  Pain description: soreness  Aggravating factors: sit to stand transfers  Relieving factors: not indicated (resolves upon completion of motion)   PERTINENT HISTORY:  PPM 2 sick sinus syndrome/SA node dysfunction & complete heart block, orthostatic hypotension, COPD, OSA, HTN, DM2, Parkinson's Disease, CVA 03/2021, hypothyroidism, PTSD, bipolar I disorder   PRECAUTIONS: Fall and ICD/Pacemaker  RED FLAGS: None  WEIGHT BEARING RESTRICTIONS: No  FALLS:  Has patient fallen in last 6 months? No  LIVING ENVIRONMENT: Lives with: lives alone and with his dog Lives in: Other Independent living facility (3rd floor) Stairs:  Elevator Has following equipment at home: Single point cane, Environmental consultant - 4 wheeled, and hiking pole  OCCUPATION: Retired  PLOF: Independent and Leisure: being outdoors - walking his dog, and stays active most of the day  PATIENT GOALS: "To be better prepared for balance issues and be able to adjust to avoid falling."   OBJECTIVE: (objective measures completed at initial evaluation unless otherwise dated)  DIAGNOSTIC FINDINGS:  03/14/22 & 03/15/22 - CT Head w/o contrast:  IMPRESSION: 1. No evidence of acute intracranial abnormality. 2. Chronic small vessel ischemic disease and cerebral atrophy.  03/14/22 - CT Angio Head & Neck: IMPRESSION: 1. Negative CTA for large vessel occlusion or other emergent finding. 2. Mild for age atheromatous change about the carotid bifurcations and carotid siphons without hemodynamically significant stenosis.  COGNITION: Overall cognitive status: Within functional limits for tasks assessed   SENSATION: WFL Except neuropathy in his feet - most notable at  night  COORDINATION: Gross motor WFL B UE/LE   POSTURE:  rounded shoulders, forward head, and flexed trunk   MUSCLE LENGTH: Hamstrings: mod tight B ITB: mild/mod tight B Piriformis: mild/mod tight B Hip flexors: mod/severe tight B Quads: mod tight B Heelcord: mod tight L, mild tight R   LOWER EXTREMITY ROM:    Grossly WFL with exceptions of limitation related to muscle tightness as above  LOWER EXTREMITY MMT:    MMT Right eval Left eval  Hip flexion 4+ 4+  Hip extension 4 ^ 4 ^  Hip abduction 4- 4-  Hip adduction 4 4  Hip internal rotation 4+ 4+  Hip external rotation 4 4  Knee flexion 5 5  Knee extension 4+ 5  Ankle dorsiflexion 5 4+  Ankle plantarflexion    Ankle inversion  Ankle eversion    (Blank rows = not tested, ^ - limited ROM)  BED MOBILITY:  Sit to supine Modified independence Supine to sit Modified independence Rolling to Right Modified independence  TRANSFERS: Assistive device utilized: None  Sit to stand: Complete Independence Stand to sit: Complete Independence Chair to chair:    Floor:     GAIT: Distance walked: clinic distances Assistive device utilized: Single point cane Level of assistance: Modified independence Gait pattern: step through pattern, decreased arm swing- Right, decreased arm swing- Left, and trunk flexed Comments: Pt reports he has been using his 4WW/rollator more commonly, especially outdoors  STAIRS:  Level of Assistance:     Stair Negotiation Technique:   Number of Stairs:    Height of Stairs:   Comments:   FUNCTIONAL TESTS:  5 times sit to stand: 13.69 sec w/o UE assist  Timed up and go (TUG): 10.78 sec (Normal), 10.09 sec (Manual), 12.72 sec (Cognitive - unable to complete cognitive task w/o multiple errors) 10 meter walk test: 9.00 sec w/o AD; Gait speed = 3.64 ft/sec  Berg Balance Scale: 44/56; 37-45 significant (>80%) risk for falls (04/18/23)  Dynamic Gait Index: 17/30; < 19 = high risk fall  (04/18/23)  04/18/2023  Berg = 44/56; scores of 37-45 indicate a significant (>80%) risk for falls FGA = 17/30; score of <19/30 indicate a high risk for falls  Berg Balance Test   Sit to Stand Able to stand without using hands and stabilize independently    Standing Unsupported Able to stand safely 2 minutes    Sitting with Back Unsupported but Feet Supported on Floor or Stool Able to sit safely and securely 2 minutes    Stand to Sit Sits safely with minimal use of hands    Transfers Able to transfer safely, definite need of hands    Standing Unsupported with Eyes Closed Able to stand 10 seconds with supervision    Standing Unsupported with Feet Together Able to place feet together independently and stand for 1 minute with supervision    From Standing, Reach Forward with Outstretched Arm Can reach forward >12 cm safely (5")    From Standing Position, Pick up Object from Floor Able to pick up shoe safely and easily    From Standing Position, Turn to Look Behind Over each Shoulder Looks behind one side only/other side shows less weight shift    Turn 360 Degrees Able to turn 360 degrees safely but slowly    Standing Unsupported, Alternately Place Feet on Step/Stool Able to stand independently and safely and complete 8 steps in 20 seconds    Standing Unsupported, One Foot in Front Able to take small step independently and hold 30 seconds    Standing on One Leg Tries to lift leg/unable to hold 3 seconds but remains standing independently    Total Score 44    Berg comment: 37-45 significant risk for falls(>80%)      Functional Gait  Assessment   Gait Level Surface Walks 20 ft in less than 7 sec but greater than 5.5 sec, uses assistive device, slower speed, mild gait deviations, or deviates 6-10 in outside of the 12 in walkway width.    Change in Gait Speed Able to change speed, demonstrates mild gait deviations, deviates 6-10 in outside of the 12 in walkway width, or no gait deviations, unable to  achieve a major change in velocity, or uses a change in velocity, or uses an assistive device.    Gait with  Horizontal Head Turns Performs head turns smoothly with slight change in gait velocity (eg, minor disruption to smooth gait path), deviates 6-10 in outside 12 in walkway width, or uses an assistive device.    Gait with Vertical Head Turns Performs task with slight change in gait velocity (eg, minor disruption to smooth gait path), deviates 6 - 10 in outside 12 in walkway width or uses assistive device    Gait and Pivot Turn Pivot turns safely within 3 sec and stops quickly with no loss of balance.    Step Over Obstacle Is able to step over one shoe box (4.5 in total height) without changing gait speed. No evidence of imbalance.    Gait with Narrow Base of Support Ambulates less than 4 steps heel to toe or cannot perform without assistance.    Gait with Eyes Closed Walks 20 ft, slow speed, abnormal gait pattern, evidence for imbalance, deviates 10-15 in outside 12 in walkway width. Requires more than 9 sec to ambulate 20 ft.    Ambulating Backwards Walks 20 ft, slow speed, abnormal gait pattern, evidence for imbalance, deviates 10-15 in outside 12 in walkway width.    Steps Alternating feet, must use rail.    Total Score 17    FGA comment: < 19 = high risk fall      Scores as of discharge from last PT episode - 09/07/2022: 5xSTS = 12.87 sec w/o UE assist TUG: Normal = 8.34 sec w/o AD; Manual = 9.16 sec; Cognitive = 14.06 sec : 6.44 sec w/o AD Gait speed: 5.09 ft/sec w/o AD Berg = 52/56; 52-55 lower fall risk (> 25%) FGA = 25/30; 25-28 = low risk fall   PATIENT SURVEYS:  ABC scale 980 / 1600 = 61.3 % (50-80% = moderate level of physical functioning); <69% indicates risk for recurrent falls in PD  Score as of discharge from last PT episode - 09/07/2022: ABC scale: 1170 / 1600 = 73.1 %    TODAY'S TREATMENT:   05/10/2023  THERAPEUTIC EXERCISE: To improve strength, endurance, and  flexibility.  Demonstration, verbal and tactile cues throughout for technique.  NuStep - L5 x 6 minutes (UE/LE to promote reciprocal movement patterns) Lumbar extension/hip flexor stretch stretch with forearms on wall 2 x 30" Doorway pec stretch 2 x 30" Mod thomas quad/hip flexor stretch 3 x 30" bil POE with pillow under abdomen/hips for trunk extension stretch x ~5 min  NEUROMUSCULAR RE-EDUCATION: To improve balance, coordination, kinesthesia, posture, proprioception, amplitude of movement, speed of movement to reduce bradykinesia, and reduce rigidity.  PWR! Walk 2 x 180' CW/CCW around loop in PT gym - focusing on upright posture, increasing step length and clearance as well as increased reciprocal arm swing Supine PWR! Moves - Up, [employer], [employer] & Step x 10 each - cues necessary during Twist to allow arms to return to full horiz ABD position before initiating movement for opposite side, VC & TC/facilitation necessary for sequencing of Step pattern Alt PWR! Rock/Step to high targets on wall x 10 facing wall and x 10 with back to wall Attempted basic movement patterns for prone PWR! Moves but did not attempt full sets   05/08/2023  THERAPEUTIC EXERCISE: To improve strength and endurance.  Demonstration, verbal and tactile cues throughout for technique.  NuStep - L5 x 6 minutes (UE/LE to promote reciprocal movement patterns)  NEUROMUSCULAR RE-EDUCATION: To improve balance, coordination, kinesthesia, posture, proprioception, reduce fall risk, amplitude of movement, speed of movement to reduce bradykinesia, and reduce rigidity. Quadruped/All  4's PWR! Moves - Up, Rock, Twist & Step x 10 each (modified in tall kneeling with UE's supported on chair)  PWR! Alt forward step x 6-7 - intermittent UE support on counter for balance  PWR! Alt backward step x 6-7 - intermittent UE support on counter for balance PWR! Unilateral step through fwd and back with reciprocal arm swing x 8, contralateral UE support on  counter for balance    05/01/2023 THERAPEUTIC EXERCISE: Nustep L6 x 6 min UEs/LEs  NEUROMUSCULAR RE-EDUCATION: Supine PWR!Moves - up, rock, twist & step 2x10 each with boom whackers Sitting PWR!Moves - up, rock, twist & step 2x10 each with boom whackers Against wall twist for balance and upright posture using boom whackers 2x10 Against wall side to side step with arms out stretched Ambulation: normal gait with SPC x 1 lap, "walk like you're walking over mud puddles" x 1 lap, backwards walking x 1/2 lap   04/27/2023 THERAPEUTIC EXERCISE: To improve strength and endurance.  Demonstration, verbal and tactile cues throughout for technique.  Rec Bike - L2 x 6 min Counter squat 10 x 3" Standing alt hip ABD with looped GTB at thighs x 10 bil Standing alt hip extension with looped GTB at thighs x 10 bil Standing alt hip ABD/ER clam with looped GTB at thighs x 10 bil  NEUROMUSCULAR RE-EDUCATION: To improve balance, coordination, kinesthesia, posture, proprioception, reduce fall risk, amplitude of movement, speed of movement to reduce bradykinesia, and reduce rigidity. Supine PWR! Moves - Up, [employer], Twist & Step x 10 each PWR! Sit to stand from [employer] pad on mat table x 10 Counter squat + hip ABD/ER into looped GTB at distal thighs 10 x 3" B side-stepping along counter with looped GTB at distal thighs x 10 ft, with looped GTB at mid calves 2 x 10 ft   PATIENT EDUCATION:  Education details: continue with current HEP  Person educated: Patient Education method: Explanation Education comprehension: verbalized understanding  HOME EXERCISE PROGRAM: Access Code: [email or web address] URL: [email or web address] Date: 05/10/2023 Prepared by: Felecia Hopper  Exercises - Hip Flexor Stretch with Strap on Table  - 2 x daily - 7 x weekly - 3 reps - 30 sec hold - Supine Hamstring Stretch with Strap  - 2 x daily - 7 x weekly - 3 reps - 30 sec hold - Standing Lumbar Extension at Wall - Forearms  - 2 x  daily - 7 x weekly - 3 reps - 30 sec hold - Doorway Pec Stretch at 90 Degrees Abduction  - 2 x daily - 7 x weekly - 3 reps - 30 sec hold  PWR! Moves: Seated Standing   ASSESSMENT:  CLINICAL IMPRESSION: Victor Osborne" reports some muscle soreness following re-introduction of quadruped PWR! Moves last visit but no pain.  He requested to review the supine PWR! Moves today as he feels that these help with his bed mobility - minor cues for Up and Rock, but repeated cues necessary for proper movement patterns for Twist and Step patterns.  Remainder of session focusing on facilitation of upright posture during gait and standing activities, utilizing stretching to reduce muscle tightness limiting upright posture as well as movement encouraging upward reach and gaze.  Pt notes greatest limitation due to quad/hip flexor tightness when it comes to upright activities.  Initiated trial of prone positioning with pillow under abdomen/hips in prep for potential re-introduction of Prone PWR! Moves in upcoming visits.  Victor Osborne will benefit from continued skilled PT to address ongoing strength, posture and  motor control deficits to improve mobility and activity tolerance with decreased risk for falls.   OBJECTIVE IMPAIRMENTS: Abnormal gait, decreased activity tolerance, decreased balance, decreased coordination, decreased endurance, decreased knowledge of use of DME, decreased mobility, difficulty walking, decreased ROM, decreased strength, decreased safety awareness, dizziness, impaired perceived functional ability, impaired flexibility, improper body mechanics, postural dysfunction, and pain.     GOALS: Goals reviewed with patient? Yes  SHORT TERM GOALS: Target date: 05/18/2023   Complete remaining standardized balance assessment and update LTG's accordingly Baseline: Berg and FGA TBA Goal status: MET - 04/18/23  Assess need for new rollator/4WW and provide recommendations to MD for DME prescription.  Baseline:   Goal status: IN PROGRESS - 05/08/23 - pt reports his 4WW customization is in "limbo" and has not been able to bring walker for PT to assess  Patient will be independent with initial HEP Baseline:  Goal status: MET - 05/10/23  Patient will demonstrate improved B proximal LE strength to >/= 4+/5 for improved stability and ease of mobility. Baseline: Refer to above LE MMT table Goal status: IN PROGRESS  LONG TERM GOALS: Target date: 06/29/2023  Patient will be independent with ongoing/advanced HEP for self-management at home incorporating PWR! Moves as indicated .  Baseline:  Goal status: IN PROGRESS  2.  Patient will be able to ambulate at least 800 ft with LRAD including outdoor and unlevel surfaces, independently with no LOB, for improved community gait.   Baseline:  Goal status: IN PROGRESS  3.  Patient will be able to step up/down curb safely with LRAD for safety with community ambulation.  Baseline:  Goal status: IN PROGRESS   4.  Patient will demonstrate at least 4 point improvement on FGA to improve gait stability and reduce risk for falls.  Baseline: 17/30 (04/18/23) Goal status: IN PROGRESS  5.  Patient will improve Berg score by at least 8 points to improve safety and stability with ADLs in standing and reduce risk for falls.    Baseline: 44/56 (04/18/23) Goal status: IN PROGRESS  6.  Patient will report >/= 70% on ABC scale to demonstrate improved balance confidence and decreased risk for falls. Baseline: 980 / 1600 = 61.3 %  Goal status: IN PROGRESS  7. Patient will verbalize understanding of local Parkinson's disease community resources, including community fitness post d/c. Baseline:  Goal status: IN PROGRESS   PLAN:  PT FREQUENCY: 2x/week  PT DURATION: 8 weeks  PLANNED INTERVENTIONS: [id number]- PT Re-evaluation, 97110-Therapeutic exercises, 97530- Therapeutic activity, 97112- Neuromuscular re-education, 97535- Self Care, [id number]- Manual therapy, [id number]- Gait training,  [phone]- Electrical stimulation (unattended), 97035- Ultrasound, [id number]- Ionotophoresis 4mg /ml Dexamethasone, Patient/Family education, Balance training, Stair training, Taping, Dry Needling, Joint mobilization, DME instructions, Cryotherapy, Moist heat, and 97750- Physical Performance Test or Measurement  PLAN FOR NEXT SESSION: Assess for best 4-wheel style walker and provide DME recommendation to MD; progress postural and proximal LE flexibility and core/proximal LE strengthening - update/modify HEP as indicated; progress PWR! Moves   [place], [place] 05/10/2023, 11:32 AM

## 2023-05-15 ENCOUNTER — Encounter: Payer: Self-pay | Admitting: Physical Therapy

## 2023-05-15 ENCOUNTER — Ambulatory Visit: Admitting: Physical Therapy

## 2023-05-15 DIAGNOSIS — M6281 Muscle weakness (generalized): Secondary | ICD-10-CM

## 2023-05-15 DIAGNOSIS — R293 Abnormal posture: Secondary | ICD-10-CM

## 2023-05-15 DIAGNOSIS — R2681 Unsteadiness on feet: Secondary | ICD-10-CM

## 2023-05-15 DIAGNOSIS — R2689 Other abnormalities of gait and mobility: Secondary | ICD-10-CM

## 2023-05-15 NOTE — Therapy (Signed)
 OUTPATIENT PHYSICAL THERAPY TREATMENT   Patient Name: Victor Osborne MRN: [id number] DOB:February 26, 1932, 89 y.o., male Today's Date: 05/15/2023   END OF SESSION:  PT End of Session - 05/15/23 1144     Visit Number 8    Date for PT Re-Evaluation 06/29/23    Authorization Type Medicare & [employer]    Progress Note Due on Visit 10    PT Start Time 1144    PT Stop Time 1231    PT Time Calculation (min) 47 min    Activity Tolerance Patient tolerated treatment well;Patient limited by fatigue    Behavior During Therapy WFL for tasks assessed/performed                Past Medical History:  Diagnosis Date   Bipolar 1 disorder (HCC)    COPD (chronic obstructive pulmonary disease) (HCC)    Depression    First degree AV block    Hypertension    pt denies but was in medical record   Hypoglycemia, unspecified    Myalgia and myositis, unspecified    Obesity    Pneumonia 1970   Skin cancer (melanoma) (HCC)    Thyroid  disease    Unspecified hypothyroidism    UPJ obstruction, congenital    Past Surgical History:  Procedure Laterality Date   APPENDECTOMY  1939   malignant melanoma removed from right forehead  2008   PERMANENT PACEMAKER INSERTION N/A 06/19/2013   Procedure: PERMANENT PACEMAKER INSERTION;  Surgeon: Tammie Fall, MD;  Location: [facility] CATH LAB;  Service: Cardiovascular;  Laterality: N/A;   PPM GENERATOR CHANGEOUT N/A 09/28/2022   Procedure: PPM GENERATOR CHANGEOUT;  Surgeon: Tammie Fall, MD;  Location: [facility] INVASIVE CV LAB;  Service: Cardiovascular;  Laterality: N/A;   right big toe  surgery  2009   TRANSURETHRAL RESECTION OF PROSTATE  02/04/2011   Procedure: TRANSURETHRAL RESECTION OF THE PROSTATE (TURP);  Surgeon: Kristeen Peto, MD;  Location: [facility] ORS;  Service: Urology;  Laterality: N/A;   URETHRA SURGERY  2010   reconstruction   Patient Active Problem List   Diagnosis Date Noted   Hyperlipidemia 03/15/2022   TIA (transient ischemic attack) 03/14/2022    History of neoplasm 04/12/2021   Melanocytic nevi of trunk 04/12/2021   Other seborrheic keratosis 04/12/2021   Cerebral embolism with cerebral infarction 03/31/2021   Left leg weakness 03/30/2021   Chronic post-traumatic stress disorder 12/05/2017   Bipolar I disorder, current or most recent episode depressed, in partial remission (HCC) 12/05/2017   CHB (complete heart block) (HCC) 01/27/2017   HTN (hypertension) 01/27/2017   Hypothyroidism 10/11/2016   Parkinson disease (HCC) 09/08/2016   Chronic osteoarthritis 03/20/2015   Bipolar 1 disorder (HCC) 03/20/2015   Tremor 03/20/2015   Pacemaker 09/24/2013   SSS (sick sinus syndrome) (HCC) 06/19/2013   First degree AV block 04/09/2013   Allergic rhinitis 03/17/2013   Obstructive sleep apnea 12/18/2006   COPD mixed type (HCC) 12/18/2006   History of malignant melanoma of skin 12/18/2006   Diabetes mellitus (HCC) 12/18/2006    PCP: Adra Alanis, FNP   REFERRING PROVIDER: Shirline Dover, DO   REFERRING DIAG: G20.A1 (ICD-10-CM) - Parkinson's disease without dyskinesia or fluctuating manifestations (HCC)   THERAPY DIAG:  Other abnormalities of gait and mobility  Unsteadiness on feet  Muscle weakness (generalized)  Abnormal posture  RATIONALE FOR EVALUATION AND TREATMENT: Rehabilitation  ONSET DATE: Initial PD diagnosis >15 yrs ago, worsening symptoms over past few months  NEXT MD VISIT:  08/24/2023   SUBJECTIVE:                                                                                                                                                                                                         SUBJECTIVE STATEMENT: Pt reports he "overdid it" on his feet yesterday - a little bit sore but mostly tired.  He reports using his restorator bike for ~5 minutes before bed helps him sleep better at night.  EVAL:  Pt reports he feels that he needs to use the rollator more now than previously and notes more effort  when tries to go without the walker. He would like to use a 4WW with bigger wheels - he found one that needed fixing up and he has been using this, but would prefer a new one.  He feels like his legs are getting weaker and his activity tolerance is decreasing.  As of our PD screen on 02/21/23, He noted he was starting to feel the effects of the Parkinson's more - dizziness upon coming to standing and feeling weaker. Standing for ADLs cause him more fatigue than they used to, requiring seated rest breaks. He noted increased difficulty with UB dressing when trying to don a jacket. Walking requires more effort and he tires more easily. Feels more unsteady on his feet.   Pt accompanied by: self  PAIN: Are you having pain? No and Yes: NPRS scale: 2/10 currently, up to ~4-5/10 when getting up (depends on time of day) Pain location: B anterior thighs  Pain description: soreness  Aggravating factors: sit to stand transfers  Relieving factors: not indicated (resolves upon completion of motion)   PERTINENT HISTORY:  PPM 2 sick sinus syndrome/SA node dysfunction & complete heart block, orthostatic hypotension, COPD, OSA, HTN, DM2, Parkinson's Disease, CVA 03/2021, hypothyroidism, PTSD, bipolar I disorder   PRECAUTIONS: Fall and ICD/Pacemaker  RED FLAGS: None  WEIGHT BEARING RESTRICTIONS: No  FALLS:  Has patient fallen in last 6 months? No  LIVING ENVIRONMENT: Lives with: lives alone and with his dog Lives in: Other Independent living facility (3rd floor) Stairs:  Elevator Has following equipment at home: Single point cane, Environmental consultant - 4 wheeled, and hiking pole  OCCUPATION: Retired  PLOF: Independent and Leisure: being outdoors - walking his dog, and stays active most of the day  PATIENT GOALS: "To be better prepared for balance issues and be able to adjust to avoid falling."   OBJECTIVE: (objective measures completed at initial evaluation unless otherwise dated)  DIAGNOSTIC FINDINGS:   03/14/22 & 03/15/22 - CT  Head w/o contrast:  IMPRESSION: 1. No evidence of acute intracranial abnormality. 2. Chronic small vessel ischemic disease and cerebral atrophy.  03/14/22 - CT Angio Head & Neck: IMPRESSION: 1. Negative CTA for large vessel occlusion or other emergent finding. 2. Mild for age atheromatous change about the carotid bifurcations and carotid siphons without hemodynamically significant stenosis.  COGNITION: Overall cognitive status: Within functional limits for tasks assessed   SENSATION: WFL Except neuropathy in his feet - most notable at night  COORDINATION: Gross motor WFL B UE/LE   POSTURE:  rounded shoulders, forward head, and flexed trunk   MUSCLE LENGTH: Hamstrings: mod tight B ITB: mild/mod tight B Piriformis: mild/mod tight B Hip flexors: mod/severe tight B Quads: mod tight B Heelcord: mod tight L, mild tight R   LOWER EXTREMITY ROM:    Grossly WFL with exceptions of limitation related to muscle tightness as above  LOWER EXTREMITY MMT:    MMT Right eval Left eval  Hip flexion 4+ 4+  Hip extension 4 ^ 4 ^  Hip abduction 4- 4-  Hip adduction 4 4  Hip internal rotation 4+ 4+  Hip external rotation 4 4  Knee flexion 5 5  Knee extension 4+ 5  Ankle dorsiflexion 5 4+  Ankle plantarflexion    Ankle inversion    Ankle eversion    (Blank rows = not tested, ^ - limited ROM)  BED MOBILITY:  Sit to supine Modified independence Supine to sit Modified independence Rolling to Right Modified independence  TRANSFERS: Assistive device utilized: None  Sit to stand: Complete Independence Stand to sit: Complete Independence Chair to chair:    Floor:     GAIT: Distance walked: clinic distances Assistive device utilized: Single point cane Level of assistance: Modified independence Gait pattern: step through pattern, decreased arm swing- Right, decreased arm swing- Left, and trunk flexed Comments: Pt reports he has been using his 4WW/rollator  more commonly, especially outdoors  STAIRS:  Level of Assistance:     Psychologist, counselling Technique:   Number of Stairs:    Height of Stairs:   Comments:   FUNCTIONAL TESTS:  5 times sit to stand: 13.69 sec w/o UE assist  Timed up and go (TUG): 10.78 sec (Normal), 10.09 sec (Manual), 12.72 sec (Cognitive - unable to complete cognitive task w/o multiple errors) 10 meter walk test: 9.00 sec w/o AD; Gait speed = 3.64 ft/sec  Berg Balance Scale: 44/56; 37-45 significant (>80%) risk for falls (04/18/23)  Dynamic Gait Index: 17/30; < 19 = high risk fall (04/18/23)  04/18/2023  Berg = 44/56; scores of 37-45 indicate a significant (>80%) risk for falls FGA = 17/30; score of <19/30 indicate a high risk for falls Berg Balance Test  Sit to Stand Able to stand without using hands and stabilize independently   Standing Unsupported Able to stand safely 2 minutes   Sitting with Back Unsupported but Feet Supported on Floor or Stool Able to sit safely and securely 2 minutes   Stand to Sit Sits safely with minimal use of hands   Transfers Able to transfer safely, definite need of hands   Standing Unsupported with Eyes Closed Able to stand 10 seconds with supervision   Standing Unsupported with Feet Together Able to place feet together independently and stand for 1 minute with supervision   From Standing, Reach Forward with Outstretched Arm Can reach forward >12 cm safely (5")   From Standing Position, Pick up Object from Floor Able to pick up shoe safely and  easily   From Standing Position, Turn to Look Behind Over each Shoulder Looks behind one side only/other side shows less weight shift   Turn 360 Degrees Able to turn 360 degrees safely but slowly   Standing Unsupported, Alternately Place Feet on Step/Stool Able to stand independently and safely and complete 8 steps in 20 seconds   Standing Unsupported, One Foot in Front Able to take small step independently and hold 30 seconds   Standing on One Leg Tries  to lift leg/unable to hold 3 seconds but remains standing independently   Total Score 44   Berg comment: 37-45 significant risk for falls(>80%)   Functional Gait  Assessment  Gait Level Surface Walks 20 ft in less than 7 sec but greater than 5.5 sec, uses assistive device, slower speed, mild gait deviations, or deviates 6-10 in outside of the 12 in walkway width.   Change in Gait Speed Able to change speed, demonstrates mild gait deviations, deviates 6-10 in outside of the 12 in walkway width, or no gait deviations, unable to achieve a major change in velocity, or uses a change in velocity, or uses an assistive device.   Gait with Horizontal Head Turns Performs head turns smoothly with slight change in gait velocity (eg, minor disruption to smooth gait path), deviates 6-10 in outside 12 in walkway width, or uses an assistive device.   Gait with Vertical Head Turns Performs task with slight change in gait velocity (eg, minor disruption to smooth gait path), deviates 6 - 10 in outside 12 in walkway width or uses assistive device   Gait and Pivot Turn Pivot turns safely within 3 sec and stops quickly with no loss of balance.   Step Over Obstacle Is able to step over one shoe box (4.5 in total height) without changing gait speed. No evidence of imbalance.   Gait with Narrow Base of Support Ambulates less than 4 steps heel to toe or cannot perform without assistance.   Gait with Eyes Closed Walks 20 ft, slow speed, abnormal gait pattern, evidence for imbalance, deviates 10-15 in outside 12 in walkway width. Requires more than 9 sec to ambulate 20 ft.   Ambulating Backwards Walks 20 ft, slow speed, abnormal gait pattern, evidence for imbalance, deviates 10-15 in outside 12 in walkway width.   Steps Alternating feet, must use rail.   Total Score 17   FGA comment: < 19 = high risk fall    05/15/23: Berg = 44/56; scores of 46-51 indicate a moderate (>50%) risk for falls Berg Balance Test  Sit to Stand Able  to stand without using hands and stabilize independently   Standing Unsupported Able to stand safely 2 minutes   Sitting with Back Unsupported but Feet Supported on Floor or Stool Able to sit safely and securely 2 minutes   Stand to Sit Sits safely with minimal use of hands   Transfers Able to transfer safely, minor use of hands   Standing Unsupported with Eyes Closed Able to stand 10 seconds safely   Standing Unsupported with Feet Together Able to place feet together independently and stand for 1 minute with supervision   From Standing, Reach Forward with Outstretched Arm Can reach confidently >25 cm (10")   From Standing Position, Pick up Object from Floor Able to pick up shoe safely and easily   From Standing Position, Turn to Look Behind Over each Shoulder Looks behind one side only/other side shows less weight shift   Turn 360 Degrees Able to  turn 360 degrees safely but slowly   Standing Unsupported, Alternately Place Feet on Step/Stool Able to stand independently and safely and complete 8 steps in 20 seconds   Standing Unsupported, One Foot in Front Able to take small step independently and hold 30 seconds   Standing on One Leg Able to lift leg independently and hold 5-10 seconds   Total Score 49   Berg comment: 46-51 moderate risk for falls (>50%)    Scores as of discharge from last PT episode - 09/07/2022: 5xSTS = 12.87 sec w/o UE assist TUG: Normal = 8.34 sec w/o AD; Manual = 9.16 sec; Cognitive = 14.06 sec : 6.44 sec w/o AD Gait speed: 5.09 ft/sec w/o AD Berg = 52/56; 52-55 lower fall risk (> 25%) FGA = 25/30; 25-28 = low risk fall   PATIENT SURVEYS:  ABC scale 980 / 1600 = 61.3 % (50-80% = moderate level of physical functioning); <69% indicates risk for recurrent falls in PD  Score as of discharge from last PT episode - 09/07/2022: ABC scale: 1170 / 1600 = 73.1 %    TODAY'S TREATMENT:   05/15/2023  THERAPEUTIC EXERCISE: To improve strength and endurance.   Demonstration, verbal and tactile cues throughout for technique.  NuStep - L6 x 6 minutes - UE/LE to promote reciprocal movement patterns  PHYSICAL PERFORMANCE TEST or MEASUREMENT: Berg Balance Test  Sit to Stand Able to stand without using hands and stabilize independently   Standing Unsupported Able to stand safely 2 minutes   Sitting with Back Unsupported but Feet Supported on Floor or Stool Able to sit safely and securely 2 minutes   Stand to Sit Sits safely with minimal use of hands   Transfers Able to transfer safely, minor use of hands   Standing Unsupported with Eyes Closed Able to stand 10 seconds safely   Standing Unsupported with Feet Together Able to place feet together independently and stand for 1 minute with supervision   From Standing, Reach Forward with Outstretched Arm Can reach confidently >25 cm (10")   From Standing Position, Pick up Object from Floor Able to pick up shoe safely and easily   From Standing Position, Turn to Look Behind Over each Shoulder Looks behind one side only/other side shows less weight shift   Turn 360 Degrees Able to turn 360 degrees safely but slowly   Standing Unsupported, Alternately Place Feet on Step/Stool Able to stand independently and safely and complete 8 steps in 20 seconds   Standing Unsupported, One Foot in Front Able to take small step independently and hold 30 seconds   Standing on One Leg Able to lift leg independently and hold 5-10 seconds   Total Score 49   Berg comment: 46-51 moderate risk for falls (>50%)      NEUROMUSCULAR RE-EDUCATION: To improve balance, coordination, kinesthesia, posture, proprioception, reduce fall risk, amplitude of movement, speed of movement to reduce bradykinesia, and reduce rigidity.  Prone PWR! Moves: Up x 10 - limited lowering from PWR! Up position Rock x 5 - limited by fatigue Twist x 10 Step x 10 PWR! Sit to stand from slight elevated hi-low table x 10 PWR! Walk 2 x 120' - focusing on upright  posture and increased reciprocal arm swing - limited by fatigue and dizziness   05/10/2023  THERAPEUTIC EXERCISE: To improve strength, endurance, and flexibility.  Demonstration, verbal and tactile cues throughout for technique.  NuStep - L5 x 6 minutes (UE/LE to promote reciprocal movement patterns) Lumbar extension/hip flexor stretch  stretch with forearms on wall 2 x 30" Doorway pec stretch 2 x 30" Mod thomas quad/hip flexor stretch 3 x 30" bil POE with pillow under abdomen/hips for trunk extension stretch x ~5 min  NEUROMUSCULAR RE-EDUCATION: To improve balance, coordination, kinesthesia, posture, proprioception, amplitude of movement, speed of movement to reduce bradykinesia, and reduce rigidity.  PWR! Walk 2 x 180' CW/CCW around loop in PT gym - focusing on upright posture, increasing step length and clearance as well as increased reciprocal arm swing Supine PWR! Moves - Up, [employer], [employer] & Step x 10 each - cues necessary during Twist to allow arms to return to full horiz ABD position before initiating movement for opposite side, VC & TC/facilitation necessary for sequencing of Step pattern Alt PWR! Rock/Step to high targets on wall x 10 facing wall and x 10 with back to wall Attempted basic movement patterns for prone PWR! Moves but did not attempt full sets   05/08/2023  THERAPEUTIC EXERCISE: To improve strength and endurance.  Demonstration, verbal and tactile cues throughout for technique.  NuStep - L5 x 6 minutes (UE/LE to promote reciprocal movement patterns)  NEUROMUSCULAR RE-EDUCATION: To improve balance, coordination, kinesthesia, posture, proprioception, reduce fall risk, amplitude of movement, speed of movement to reduce bradykinesia, and reduce rigidity. Quadruped/All 4's PWR! Moves - Up, Rock, Twist & Step x 10 each (modified in tall kneeling with UE's supported on chair)  PWR! Alt forward step x 6-7 - intermittent UE support on counter for balance  PWR! Alt backward step x 6-7  - intermittent UE support on counter for balance PWR! Unilateral step through fwd and back with reciprocal arm swing x 8, contralateral UE support on counter for balance    PATIENT EDUCATION:  Education details: continue with current HEP  Person educated: Patient Education method: Explanation Education comprehension: verbalized understanding  HOME EXERCISE PROGRAM: Access Code: [email or web address] URL: [email or web address] Date: 05/10/2023 Prepared by: Felecia Hopper  Exercises - Hip Flexor Stretch with Strap on Table  - 2 x daily - 7 x weekly - 3 reps - 30 sec hold - Supine Hamstring Stretch with Strap  - 2 x daily - 7 x weekly - 3 reps - 30 sec hold - Standing Lumbar Extension at Wall - Forearms  - 2 x daily - 7 x weekly - 3 reps - 30 sec hold - Doorway Pec Stretch at 90 Degrees Abduction  - 2 x daily - 7 x weekly - 3 reps - 30 sec hold  PWR! Moves: Seated Standing   ASSESSMENT:  CLINICAL IMPRESSION: Victor Osborne" reports increased fatigue and soreness after a busy weekend.  Initiated balance reassessment today with Victor Osborne demonstrating a 5 point improvement on the Berg from 44/56 to 49/56 indicating a reduction in fall risk from significant (>80%) to moderate (>50%).  Pt requested to defer MMT reassessment today due to fatigue from weekend activity.  Introduced [employer]! Moves in prone with modification of pillow under hips/abdomen - pt able to tolerate prone position with modification, but fatigued with weight shift during PWR! Up and Rock limiting movement pattern for [employer]! Up and number of reps for [employer].  Victor Osborne notes more difficulty with sit to stand transitions, therefore worked on incorporating PWR! Up into sit to stand transition to facilitate movement and momentum for increased ease of transition.  Session conclude with PWR! Walking focusing on posture and reciprocal arm swing but limited by fatigue and dizziness. Will plan to continue strength and balance reassessments over  next few visits  to allow for STG assessment and 10th visit PN.  Victor Osborne will benefit from continued skilled PT to address ongoing strength, posture and motor control deficits to improve mobility and activity tolerance with decreased risk for falls.   OBJECTIVE IMPAIRMENTS: Abnormal gait, decreased activity tolerance, decreased balance, decreased coordination, decreased endurance, decreased knowledge of use of DME, decreased mobility, difficulty walking, decreased ROM, decreased strength, decreased safety awareness, dizziness, impaired perceived functional ability, impaired flexibility, improper body mechanics, postural dysfunction, and pain.     GOALS: Goals reviewed with patient? Yes  SHORT TERM GOALS: Target date: 05/18/2023   Complete remaining standardized balance assessment and update LTG's accordingly Baseline: Berg and FGA TBA Goal status: MET - 04/18/23  Assess need for new rollator/4WW and provide recommendations to MD for DME prescription.  Baseline:  Goal status: IN PROGRESS - 05/15/23 - pt reports his 4WW customization is in "limbo" and has not been able to bring walker for PT to assess  Patient will be independent with initial HEP Baseline:  Goal status: MET - 05/10/23  Patient will demonstrate improved B proximal LE strength to >/= 4+/5 for improved stability and ease of mobility. Baseline: Refer to above LE MMT table Goal status: IN PROGRESS  LONG TERM GOALS: Target date: 06/29/2023  Patient will be independent with ongoing/advanced HEP for self-management at home incorporating PWR! Moves as indicated .  Baseline:  Goal status: IN PROGRESS  2.  Patient will be able to ambulate at least 800 ft with LRAD including outdoor and unlevel surfaces, independently with no LOB, for improved community gait.   Baseline:  Goal status: IN PROGRESS  3.  Patient will be able to step up/down curb safely with LRAD for safety with community ambulation.  Baseline:  Goal status: IN PROGRESS    4.  Patient will demonstrate at least 4 point improvement on FGA to improve gait stability and reduce risk for falls.  Baseline: 17/30 (04/18/23) Goal status: IN PROGRESS  5.  Patient will improve Berg score by at least 8 points to improve safety and stability with ADLs in standing and reduce risk for falls.    Baseline: 44/56 (04/18/23) Goal status: IN PROGRESS - 05/15/23 - 49/56  6.  Patient will report >/= 70% on ABC scale to demonstrate improved balance confidence and decreased risk for falls. Baseline: 980 / 1600 = 61.3 %  Goal status: IN PROGRESS  7. Patient will verbalize understanding of local Parkinson's disease community resources, including community fitness post d/c. Baseline:  Goal status: IN PROGRESS   PLAN:  PT FREQUENCY: 2x/week  PT DURATION: 8 weeks  PLANNED INTERVENTIONS: 97164- PT Re-evaluation, 97110-Therapeutic exercises, 97530- Therapeutic activity, 97112- Neuromuscular re-education, 97535- Self Care, [id number]- Manual therapy, [id number]- Gait training, [phone]- Electrical stimulation (unattended), 97035- Ultrasound, [id number]- Ionotophoresis 4mg /ml Dexamethasone, Patient/Family education, Balance training, Stair training, Taping, Dry Needling, Joint mobilization, DME instructions, Cryotherapy, Moist heat, and 97750- Physical Performance Test or Measurement  PLAN FOR NEXT SESSION: Reassess LE strength, continue balance reassessment; Assess for best 4-wheel style walker and provide DME recommendation to MD; progress postural and proximal LE flexibility and core/proximal LE strengthening - update/modify HEP as indicated; progress PWR! Moves   [place], [place] 05/15/2023, 1:06 PM

## 2023-05-18 ENCOUNTER — Ambulatory Visit: Attending: Neurology | Admitting: Physical Therapy

## 2023-05-18 ENCOUNTER — Encounter: Payer: Self-pay | Admitting: Physical Therapy

## 2023-05-18 DIAGNOSIS — R2689 Other abnormalities of gait and mobility: Secondary | ICD-10-CM | POA: Diagnosis present

## 2023-05-18 DIAGNOSIS — R293 Abnormal posture: Secondary | ICD-10-CM | POA: Insufficient documentation

## 2023-05-18 DIAGNOSIS — M6281 Muscle weakness (generalized): Secondary | ICD-10-CM | POA: Insufficient documentation

## 2023-05-18 DIAGNOSIS — R2681 Unsteadiness on feet: Secondary | ICD-10-CM | POA: Insufficient documentation

## 2023-05-18 NOTE — Progress Notes (Signed)
 Remote pacemaker transmission.

## 2023-05-18 NOTE — Therapy (Signed)
 OUTPATIENT PHYSICAL THERAPY TREATMENT   Patient Name: Victor Osborne MRN: [id number] DOB:[birth year], 89 y.o., male Today's Date: 05/18/2023   END OF SESSION:  PT End of Session - 05/18/23 1147     Visit Number 9    Date for PT Re-Evaluation 06/29/23    Authorization Type Medicare & [employer]    Progress Note Due on Visit 10    PT Start Time 1147    PT Stop Time 1231    PT Time Calculation (min) 44 min    Activity Tolerance Patient tolerated treatment well    Behavior During Therapy WFL for tasks assessed/performed                 Past Medical History:  Diagnosis Date   Bipolar 1 disorder (HCC)    COPD (chronic obstructive pulmonary disease) (HCC)    Depression    First degree AV block    Hypertension    pt denies but was in medical record   Hypoglycemia, unspecified    Myalgia and myositis, unspecified    Obesity    Pneumonia 1970   Skin cancer (melanoma) (HCC)    Thyroid  disease    Unspecified hypothyroidism    UPJ obstruction, congenital    Past Surgical History:  Procedure Laterality Date   APPENDECTOMY  1939   malignant melanoma removed from right forehead  2008   PERMANENT PACEMAKER INSERTION N/A 06/19/2013   Procedure: PERMANENT PACEMAKER INSERTION;  Surgeon: Tammie Fall, MD;  Location: [facility] CATH LAB;  Service: Cardiovascular;  Laterality: N/A;   PPM GENERATOR CHANGEOUT N/A 09/28/2022   Procedure: PPM GENERATOR CHANGEOUT;  Surgeon: Tammie Fall, MD;  Location: [facility] INVASIVE CV LAB;  Service: Cardiovascular;  Laterality: N/A;   right big toe  surgery  2009   TRANSURETHRAL RESECTION OF PROSTATE  02/04/2011   Procedure: TRANSURETHRAL RESECTION OF THE PROSTATE (TURP);  Surgeon: Kristeen Peto, MD;  Location: [facility] ORS;  Service: Urology;  Laterality: N/A;   URETHRA SURGERY  2010   reconstruction   Patient Active Problem List   Diagnosis Date Noted   Hyperlipidemia 03/15/2022   TIA (transient ischemic attack) 03/14/2022   History of neoplasm  04/12/2021   Melanocytic nevi of trunk 04/12/2021   Other seborrheic keratosis 04/12/2021   Cerebral embolism with cerebral infarction 03/31/2021   Left leg weakness 03/30/2021   Chronic post-traumatic stress disorder 12/05/2017   Bipolar I disorder, current or most recent episode depressed, in partial remission (HCC) 12/05/2017   CHB (complete heart block) (HCC) 01/27/2017   HTN (hypertension) 01/27/2017   Hypothyroidism 10/11/2016   Parkinson disease (HCC) 09/08/2016   Chronic osteoarthritis 03/20/2015   Bipolar 1 disorder (HCC) 03/20/2015   Tremor 03/20/2015   Pacemaker 09/24/2013   SSS (sick sinus syndrome) (HCC) 06/19/2013   First degree AV block 04/09/2013   Allergic rhinitis 03/17/2013   Obstructive sleep apnea 12/18/2006   COPD mixed type (HCC) 12/18/2006   History of malignant melanoma of skin 12/18/2006   Diabetes mellitus (HCC) 12/18/2006    PCP: Adra Alanis, FNP   REFERRING PROVIDER: Shirline Dover, DO   REFERRING DIAG: G20.A1 (ICD-10-CM) - Parkinson's disease without dyskinesia or fluctuating manifestations (HCC)   THERAPY DIAG:  Other abnormalities of gait and mobility  Unsteadiness on feet  Muscle weakness (generalized)  Abnormal posture  RATIONALE FOR EVALUATION AND TREATMENT: Rehabilitation  ONSET DATE: Initial PD diagnosis >15 yrs ago, worsening symptoms over past few months  NEXT MD VISIT: 08/24/2023  SUBJECTIVE:                                                                                                                                                                                                         SUBJECTIVE STATEMENT: Pt reports he was working outside moving 40# bags around yesterday and feels somewhat worn out today.  EVAL:  Pt reports he feels that he needs to use the rollator more now than previously and notes more effort when tries to go without the walker. He would like to use a 4WW with bigger wheels - he found one  that needed fixing up and he has been using this, but would prefer a new one.  He feels like his legs are getting weaker and his activity tolerance is decreasing.  As of our PD screen on 02/21/23, He noted he was starting to feel the effects of the Parkinson's more - dizziness upon coming to standing and feeling weaker. Standing for ADLs cause him more fatigue than they used to, requiring seated rest breaks. He noted increased difficulty with UB dressing when trying to don a jacket. Walking requires more effort and he tires more easily. Feels more unsteady on his feet.   Pt accompanied by: self  PAIN: Are you having pain? No and Yes: NPRS scale: 2/10 currently, up to ~4-5/10 when getting up (depends on time of day) Pain location: B anterior thighs  Pain description: soreness  Aggravating factors: sit to stand transfers  Relieving factors: not indicated (resolves upon completion of motion)   PERTINENT HISTORY:  PPM 2 sick sinus syndrome/SA node dysfunction & complete heart block, orthostatic hypotension, COPD, OSA, HTN, DM2, Parkinson's Disease, CVA 03/2021, hypothyroidism, PTSD, bipolar I disorder   PRECAUTIONS: Fall and ICD/Pacemaker  RED FLAGS: None  WEIGHT BEARING RESTRICTIONS: No  FALLS:  Has patient fallen in last 6 months? No  LIVING ENVIRONMENT: Lives with: lives alone and with his dog Lives in: Other Independent living facility (3rd floor) Stairs:  Elevator Has following equipment at home: Single point cane, Environmental consultant - 4 wheeled, and hiking pole  OCCUPATION: Retired  PLOF: Independent and Leisure: being outdoors - walking his dog, and stays active most of the day  PATIENT GOALS: "To be better prepared for balance issues and be able to adjust to avoid falling."   OBJECTIVE: (objective measures completed at initial evaluation unless otherwise dated)  DIAGNOSTIC FINDINGS:  03/14/22 & 03/15/22 - CT Head w/o contrast:  IMPRESSION: 1. No evidence of acute intracranial  abnormality. 2. Chronic small vessel ischemic disease and cerebral atrophy.  03/14/22 - CT Angio Head & Neck: IMPRESSION: 1. Negative CTA for large vessel occlusion or other emergent finding. 2. Mild for age atheromatous change about the carotid bifurcations and carotid siphons without hemodynamically significant stenosis.  COGNITION: Overall cognitive status: Within functional limits for tasks assessed   SENSATION: WFL Except neuropathy in his feet - most notable at night  COORDINATION: Gross motor WFL B UE/LE   POSTURE:  rounded shoulders, forward head, and flexed trunk   MUSCLE LENGTH: Hamstrings: mod tight B ITB: mild/mod tight B Piriformis: mild/mod tight B Hip flexors: mod/severe tight B Quads: mod tight B Heelcord: mod tight L, mild tight R   LOWER EXTREMITY ROM:    Grossly WFL with exceptions of limitation related to muscle tightness as above  LOWER EXTREMITY MMT:    MMT Right eval Left eval R 05/18/23 L 05/18/23  Hip flexion 4+ 4+ 5 5  Hip extension 4 ^ 4 ^ 4+ ^ 4+ ^  Hip abduction 4- 4- 4+ 4+  Hip adduction 4 4 4+ 4+  Hip internal rotation 4+ 4+ 5 5  Hip external rotation 4 4 4+ 4+  Knee flexion 5 5 5 5   Knee extension 4+ 5 5 5   Ankle dorsiflexion 5 4+ 5 5  Ankle plantarflexion      Ankle inversion      Ankle eversion      (Blank rows = not tested, ^ - limited ROM)  BED MOBILITY:  Sit to supine Modified independence Supine to sit Modified independence Rolling to Right Modified independence  TRANSFERS: Assistive device utilized: None  Sit to stand: Complete Independence Stand to sit: Complete Independence Chair to chair:    Floor:     GAIT: Distance walked: clinic distances Assistive device utilized: Single point cane Level of assistance: Modified independence Gait pattern: step through pattern, decreased arm swing- Right, decreased arm swing- Left, and trunk flexed Comments: Pt reports he has been using his 4WW/rollator more commonly, especially  outdoors  STAIRS:  Level of Assistance:     Psychologist, counselling Technique:   Number of Stairs:    Height of Stairs:   Comments:   FUNCTIONAL TESTS:  5 times sit to stand: 13.69 sec w/o UE assist  Timed up and go (TUG): 10.78 sec (Normal), 10.09 sec (Manual), 12.72 sec (Cognitive - unable to complete cognitive task w/o multiple errors) 10 meter walk test: 9.00 sec w/o AD; Gait speed = 3.64 ft/sec  Berg Balance Scale: 44/56; 37-45 significant (>80%) risk for falls (04/18/23)  Dynamic Gait Index: 17/30; < 19 = high risk fall (04/18/23)  04/18/2023  Berg = 44/56; scores of 37-45 indicate a significant (>80%) risk for falls FGA = 17/30; score of <19/30 indicate a high risk for falls Berg Balance Test  Sit to Stand Able to stand without using hands and stabilize independently   Standing Unsupported Able to stand safely 2 minutes   Sitting with Back Unsupported but Feet Supported on Floor or Stool Able to sit safely and securely 2 minutes   Stand to Sit Sits safely with minimal use of hands   Transfers Able to transfer safely, definite need of hands   Standing Unsupported with Eyes Closed Able to stand 10 seconds with supervision   Standing Unsupported with Feet Together Able to place feet together independently and stand for 1 minute with supervision   From Standing, Reach Forward with Outstretched Arm Can reach forward >12 cm safely (5")   From Standing Position, Pick up Object from  Floor Able to pick up shoe safely and easily   From Standing Position, Turn to Look Behind Over each Shoulder Looks behind one side only/other side shows less weight shift   Turn 360 Degrees Able to turn 360 degrees safely but slowly   Standing Unsupported, Alternately Place Feet on Step/Stool Able to stand independently and safely and complete 8 steps in 20 seconds   Standing Unsupported, One Foot in Front Able to take small step independently and hold 30 seconds   Standing on One Leg Tries to lift leg/unable to hold  3 seconds but remains standing independently   Total Score 44   Berg comment: 37-45 significant risk for falls(>80%)   Functional Gait  Assessment  Gait Level Surface Walks 20 ft in less than 7 sec but greater than 5.5 sec, uses assistive device, slower speed, mild gait deviations, or deviates 6-10 in outside of the 12 in walkway width.   Change in Gait Speed Able to change speed, demonstrates mild gait deviations, deviates 6-10 in outside of the 12 in walkway width, or no gait deviations, unable to achieve a major change in velocity, or uses a change in velocity, or uses an assistive device.   Gait with Horizontal Head Turns Performs head turns smoothly with slight change in gait velocity (eg, minor disruption to smooth gait path), deviates 6-10 in outside 12 in walkway width, or uses an assistive device.   Gait with Vertical Head Turns Performs task with slight change in gait velocity (eg, minor disruption to smooth gait path), deviates 6 - 10 in outside 12 in walkway width or uses assistive device   Gait and Pivot Turn Pivot turns safely within 3 sec and stops quickly with no loss of balance.   Step Over Obstacle Is able to step over one shoe box (4.5 in total height) without changing gait speed. No evidence of imbalance.   Gait with Narrow Base of Support Ambulates less than 4 steps heel to toe or cannot perform without assistance.   Gait with Eyes Closed Walks 20 ft, slow speed, abnormal gait pattern, evidence for imbalance, deviates 10-15 in outside 12 in walkway width. Requires more than 9 sec to ambulate 20 ft.   Ambulating Backwards Walks 20 ft, slow speed, abnormal gait pattern, evidence for imbalance, deviates 10-15 in outside 12 in walkway width.   Steps Alternating feet, must use rail.   Total Score 17   FGA comment: < 19 = high risk fall    05/15/23: Berg = 44/56; scores of 46-51 indicate a moderate (>50%) risk for falls Berg Balance Test  Sit to Stand Able to stand without using  hands and stabilize independently   Standing Unsupported Able to stand safely 2 minutes   Sitting with Back Unsupported but Feet Supported on Floor or Stool Able to sit safely and securely 2 minutes   Stand to Sit Sits safely with minimal use of hands   Transfers Able to transfer safely, minor use of hands   Standing Unsupported with Eyes Closed Able to stand 10 seconds safely   Standing Unsupported with Feet Together Able to place feet together independently and stand for 1 minute with supervision   From Standing, Reach Forward with Outstretched Arm Can reach confidently >25 cm (10")   From Standing Position, Pick up Object from Floor Able to pick up shoe safely and easily   From Standing Position, Turn to Look Behind Over each Shoulder Looks behind one side only/other side shows less weight  shift   Turn 360 Degrees Able to turn 360 degrees safely but slowly   Standing Unsupported, Alternately Place Feet on Step/Stool Able to stand independently and safely and complete 8 steps in 20 seconds   Standing Unsupported, One Foot in Front Able to take small step independently and hold 30 seconds   Standing on One Leg Able to lift leg independently and hold 5-10 seconds   Total Score 49   Berg comment: 46-51 moderate risk for falls (>50%)    Scores as of discharge from last PT episode - 09/07/2022: 5xSTS = 12.87 sec w/o UE assist TUG: Normal = 8.34 sec w/o AD; Manual = 9.16 sec; Cognitive = 14.06 sec : 6.44 sec w/o AD Gait speed: 5.09 ft/sec w/o AD Berg = 52/56; 52-55 lower fall risk (> 25%) FGA = 25/30; 25-28 = low risk fall   PATIENT SURVEYS:  ABC scale 980 / 1600 = 61.3 % (50-80% = moderate level of physical functioning); <69% indicates risk for recurrent falls in PD  Score as of discharge from last PT episode - 09/07/2022: ABC scale: 1170 / 1600 = 73.1 %    TODAY'S TREATMENT:   05/18/2023  THERAPEUTIC EXERCISE: To improve strength, endurance, and flexibility.  Demonstration, verbal  and tactile cues throughout for technique.  NuStep - L6 x 6 minutes - UE/LE to promote reciprocal movement patterns  THERAPEUTIC ACTIVITIES: To improve functional performance.  Demonstration, verbal and tactile cues throughout for technique. LE MMT  NEUROMUSCULAR RE-EDUCATION: To improve balance, coordination, kinesthesia, posture, proprioception, reduce fall risk, amplitude of movement, speed of movement to reduce bradykinesia, and reduce rigidity. Alt PWR! Rock/Step to high targets on wall x 10 facing wall  Alt PWR! Rock/Twist/Step to high targets on wall x 10 with back to wall PWR! Unilateral step through fwd and back with reciprocal arm swing x 5, attempted w/o UE support but more unstable today - added contralateral UE support on back of chair for balance but still with increased LOB (potentially due to fatigue from his activity yesterday) Standing PWR! Up with GTB resistance for shoulder extension 2 x 10 Standing alternating GTB shoulder extension to simulate reciprocal arm swing 2 x 10   05/15/2023  THERAPEUTIC EXERCISE: To improve strength and endurance.  Demonstration, verbal and tactile cues throughout for technique.  NuStep - L6 x 6 minutes - UE/LE to promote reciprocal movement patterns  PHYSICAL PERFORMANCE TEST or MEASUREMENT: Berg Balance Test  Sit to Stand Able to stand without using hands and stabilize independently   Standing Unsupported Able to stand safely 2 minutes   Sitting with Back Unsupported but Feet Supported on Floor or Stool Able to sit safely and securely 2 minutes   Stand to Sit Sits safely with minimal use of hands   Transfers Able to transfer safely, minor use of hands   Standing Unsupported with Eyes Closed Able to stand 10 seconds safely   Standing Unsupported with Feet Together Able to place feet together independently and stand for 1 minute with supervision   From Standing, Reach Forward with Outstretched Arm Can reach confidently >25 cm (10")   From  Standing Position, Pick up Object from Floor Able to pick up shoe safely and easily   From Standing Position, Turn to Look Behind Over each Shoulder Looks behind one side only/other side shows less weight shift   Turn 360 Degrees Able to turn 360 degrees safely but slowly   Standing Unsupported, Alternately Place Feet on Step/Stool Able to stand independently  and safely and complete 8 steps in 20 seconds   Standing Unsupported, One Foot in Front Able to take small step independently and hold 30 seconds   Standing on One Leg Able to lift leg independently and hold 5-10 seconds   Total Score 49   Berg comment: 46-51 moderate risk for falls (>50%)      NEUROMUSCULAR RE-EDUCATION: To improve balance, coordination, kinesthesia, posture, proprioception, reduce fall risk, amplitude of movement, speed of movement to reduce bradykinesia, and reduce rigidity.  Prone PWR! Moves: Up x 10 - limited lowering from PWR! Up position Rock x 5 - limited by fatigue Twist x 10 Step x 10 PWR! Sit to stand from slight elevated hi-low table x 10 PWR! Walk 2 x 120' - focusing on upright posture and increased reciprocal arm swing - limited by fatigue and dizziness   05/10/2023  THERAPEUTIC EXERCISE: To improve strength, endurance, and flexibility.  Demonstration, verbal and tactile cues throughout for technique.  NuStep - L5 x 6 minutes (UE/LE to promote reciprocal movement patterns) Lumbar extension/hip flexor stretch stretch with forearms on wall 2 x 30" Doorway pec stretch 2 x 30" Mod thomas quad/hip flexor stretch 3 x 30" bil POE with pillow under abdomen/hips for trunk extension stretch x ~5 min  NEUROMUSCULAR RE-EDUCATION: To improve balance, coordination, kinesthesia, posture, proprioception, amplitude of movement, speed of movement to reduce bradykinesia, and reduce rigidity.  PWR! Walk 2 x 180' CW/CCW around loop in PT gym - focusing on upright posture, increasing step length and clearance as well as  increased reciprocal arm swing Supine PWR! Moves - Up, [employer], [employer] & Step x 10 each - cues necessary during Twist to allow arms to return to full horiz ABD position before initiating movement for opposite side, VC & TC/facilitation necessary for sequencing of Step pattern Alt PWR! Rock/Step to high targets on wall x 10 facing wall and x 10 with back to wall Attempted basic movement patterns for prone PWR! Moves but did not attempt full sets   PATIENT EDUCATION:  Education details: continue with current HEP  Person educated: Patient Education method: Explanation Education comprehension: verbalized understanding  HOME EXERCISE PROGRAM: Access Code: [email or web address] URL: [email or web address] Date: 05/10/2023 Prepared by: Felecia Hopper  Exercises - Hip Flexor Stretch with Strap on Table  - 2 x daily - 7 x weekly - 3 reps - 30 sec hold - Supine Hamstring Stretch with Strap  - 2 x daily - 7 x weekly - 3 reps - 30 sec hold - Standing Lumbar Extension at Wall - Forearms  - 2 x daily - 7 x weekly - 3 reps - 30 sec hold - Doorway Pec Stretch at 90 Degrees Abduction  - 2 x daily - 7 x weekly - 3 reps - 30 sec hold  PWR! Moves: Seated Standing   ASSESSMENT:  CLINICAL IMPRESSION: Maxin Mahan" reports increased fatigue after another day of heavy activity yesterday.  MMT assessment completed with overall B LE strength now 4+ to 5/5 - STG #4 met.  He continues to work on modifications to his newer 4WW/rollator and feels it will be ready for him to bring with the next appointment.  Continue to work on dynamic stepping balance utilizing PWR! movement patterns however patient struggling more with balance today potentially related to fatigue from yesterday's activity therefore shifted to progression of postural strengthening with addition of GTB to standing PWR! Up as well as alternating resisted shoulder extension to increase muscle activation for reciprocal arm swing.  Will plan to complete  balance reassessment with FGA as well as remaining STG assessment as of 10th visit PN next visit.  Raenette Bumps will benefit from continued skilled PT to address ongoing strength, posture and motor control deficits to improve mobility and activity tolerance with decreased risk for falls.   OBJECTIVE IMPAIRMENTS: Abnormal gait, decreased activity tolerance, decreased balance, decreased coordination, decreased endurance, decreased knowledge of use of DME, decreased mobility, difficulty walking, decreased ROM, decreased strength, decreased safety awareness, dizziness, impaired perceived functional ability, impaired flexibility, improper body mechanics, postural dysfunction, and pain.     GOALS: Goals reviewed with patient? Yes  SHORT TERM GOALS: Target date: 05/18/2023   Complete remaining standardized balance assessment and update LTG's accordingly Baseline: Berg and FGA TBA Goal status: MET - 04/18/23  Assess need for new rollator/4WW and provide recommendations to MD for DME prescription.  Baseline:  Goal status: IN PROGRESS - 05/15/23 - pt reports his 4WW customization is in "limbo" and has not been able to bring walker for PT to assess  Patient will be independent with initial HEP Baseline:  Goal status: MET - 05/10/23  Patient will demonstrate improved B proximal LE strength to >/= 4+/5 for improved stability and ease of mobility. Baseline: Refer to above LE MMT table Goal status: MET - 05/18/23  LONG TERM GOALS: Target date: 06/29/2023  Patient will be independent with ongoing/advanced HEP for self-management at home incorporating PWR! Moves as indicated.  Baseline:  Goal status: IN PROGRESS  2.  Patient will be able to ambulate at least 800 ft with LRAD including outdoor and unlevel surfaces, independently with no LOB, for improved community gait.   Baseline:  Goal status: IN PROGRESS  3.  Patient will be able to step up/down curb safely with LRAD for safety with community ambulation.   Baseline:  Goal status: IN PROGRESS   4.  Patient will demonstrate at least 4 point improvement on FGA to improve gait stability and reduce risk for falls.  Baseline: 17/30 (04/18/23) Goal status: IN PROGRESS  5.  Patient will improve Berg score by at least 8 points to improve safety and stability with ADLs in standing and reduce risk for falls.    Baseline: 44/56 (04/18/23) Goal status: IN PROGRESS - 05/15/23 - 49/56  6.  Patient will report >/= 70% on ABC scale to demonstrate improved balance confidence and decreased risk for falls. Baseline: 980 / 1600 = 61.3 %  Goal status: IN PROGRESS  7. Patient will verbalize understanding of local Parkinson's disease community resources, including community fitness post d/c. Baseline:  Goal status: IN PROGRESS   PLAN:  PT FREQUENCY: 2x/week  PT DURATION: 8 weeks  PLANNED INTERVENTIONS: 97164- PT Re-evaluation, 97110-Therapeutic exercises, 97530- Therapeutic activity, 97112- Neuromuscular re-education, 97535- Self Care, [id number]- Manual therapy, [phone]- Gait training, [phone]- Electrical stimulation (unattended), 97035- Ultrasound, 13086- Ionotophoresis 4mg /ml Dexamethasone, Patient/Family education, Balance training, Stair training, Taping, Dry Needling, Joint mobilization, DME instructions, Cryotherapy, Moist heat, and 97750- Physical Performance Test or Measurement  PLAN FOR NEXT SESSION: 10th visit PN - FGA reassessment, ABC scale, assess gait outdoors weather permitting; Assess for best 4-wheel style walker and provide DME recommendation to MD; progress postural and proximal LE flexibility and core/proximal LE strengthening - update/modify HEP as indicated; progress PWR! Moves   [place], [place] 05/18/2023, 7:36 PM

## 2023-05-18 NOTE — Addendum Note (Signed)
 Addended by: Edra Govern D on: 05/18/2023 01:06 PM   Modules accepted: Orders

## 2023-05-22 ENCOUNTER — Ambulatory Visit: Admitting: Physical Therapy

## 2023-05-22 ENCOUNTER — Encounter: Payer: Self-pay | Admitting: Physical Therapy

## 2023-05-22 DIAGNOSIS — R2689 Other abnormalities of gait and mobility: Secondary | ICD-10-CM

## 2023-05-22 DIAGNOSIS — M6281 Muscle weakness (generalized): Secondary | ICD-10-CM

## 2023-05-22 DIAGNOSIS — R293 Abnormal posture: Secondary | ICD-10-CM

## 2023-05-22 DIAGNOSIS — R2681 Unsteadiness on feet: Secondary | ICD-10-CM

## 2023-05-22 NOTE — Therapy (Signed)
 OUTPATIENT PHYSICAL THERAPY TREATMENT  Progress Note  Reporting Period 04/06/2023 to 05/22/2023   See note below for Objective Data and Assessment of Progress/Goals.     Patient Name: Victor Osborne MRN: [id number] DOB:September 25, 1932, 89 y.o., male Today's Date: 05/22/2023   END OF SESSION:  PT End of Session - 05/22/23 1147     Visit Number 10    Date for PT Re-Evaluation 06/29/23    Authorization Type Medicare & [employer]    Progress Note Due on Visit 10    PT Start Time 1147    PT Stop Time 1232    PT Time Calculation (min) 45 min    Activity Tolerance Patient tolerated treatment well    Behavior During Therapy WFL for tasks assessed/performed                  Past Medical History:  Diagnosis Date   Bipolar 1 disorder (HCC)    COPD (chronic obstructive pulmonary disease) (HCC)    Depression    First degree AV block    Hypertension    pt denies but was in medical record   Hypoglycemia, unspecified    Myalgia and myositis, unspecified    Obesity    Pneumonia 1970   Skin cancer (melanoma) (HCC)    Thyroid  disease    Unspecified hypothyroidism    UPJ obstruction, congenital    Past Surgical History:  Procedure Laterality Date   APPENDECTOMY  1939   malignant melanoma removed from right forehead  2008   PERMANENT PACEMAKER INSERTION N/A 06/19/2013   Procedure: PERMANENT PACEMAKER INSERTION;  Surgeon: Tammie Fall, MD;  Location: [facility] CATH LAB;  Service: Cardiovascular;  Laterality: N/A;   PPM GENERATOR CHANGEOUT N/A 09/28/2022   Procedure: PPM GENERATOR CHANGEOUT;  Surgeon: Tammie Fall, MD;  Location: [facility] INVASIVE CV LAB;  Service: Cardiovascular;  Laterality: N/A;   right big toe  surgery  2009   TRANSURETHRAL RESECTION OF PROSTATE  02/04/2011   Procedure: TRANSURETHRAL RESECTION OF THE PROSTATE (TURP);  Surgeon: Kristeen Peto, MD;  Location: [facility] ORS;  Service: Urology;  Laterality: N/A;   URETHRA SURGERY  2010   reconstruction   Patient Active  Problem List   Diagnosis Date Noted   Hyperlipidemia 03/15/2022   TIA (transient ischemic attack) 03/14/2022   History of neoplasm 04/12/2021   Melanocytic nevi of trunk 04/12/2021   Other seborrheic keratosis 04/12/2021   Cerebral embolism with cerebral infarction 03/31/2021   Left leg weakness 03/30/2021   Chronic post-traumatic stress disorder 12/05/2017   Bipolar I disorder, current or most recent episode depressed, in partial remission (HCC) 12/05/2017   CHB (complete heart block) (HCC) 01/27/2017   HTN (hypertension) 01/27/2017   Hypothyroidism 10/11/2016   Parkinson disease (HCC) 09/08/2016   Chronic osteoarthritis 03/20/2015   Bipolar 1 disorder (HCC) 03/20/2015   Tremor 03/20/2015   Pacemaker 09/24/2013   SSS (sick sinus syndrome) (HCC) 06/19/2013   First degree AV block 04/09/2013   Allergic rhinitis 03/17/2013   Obstructive sleep apnea 12/18/2006   COPD mixed type (HCC) 12/18/2006   History of malignant melanoma of skin 12/18/2006   Diabetes mellitus (HCC) 12/18/2006    PCP: Adra Alanis, FNP   REFERRING PROVIDER: Shirline Dover, DO   REFERRING DIAG: G20.A1 (ICD-10-CM) - Parkinson's disease without dyskinesia or fluctuating manifestations (HCC)   THERAPY DIAG:  Other abnormalities of gait and mobility  Unsteadiness on feet  Muscle weakness (generalized)  Abnormal posture  RATIONALE FOR EVALUATION  AND TREATMENT: Rehabilitation  ONSET DATE: Initial PD diagnosis >15 yrs ago, worsening symptoms over past few months  NEXT MD VISIT: 08/24/2023   SUBJECTIVE:                                                                                                                                                                                                         SUBJECTIVE STATEMENT: Pt reports "tweaked" his L gluteus maximus while trying to feed the chickens on Saturday when his foot slipped out of his shoe while trying to stop the chickens from escaping then  pen and is now having L sciatica.  EVAL:  Pt reports he feels that he needs to use the rollator more now than previously and notes more effort when tries to go without the walker. He would like to use a 4WW with bigger wheels - he found one that needed fixing up and he has been using this, but would prefer a new one.  He feels like his legs are getting weaker and his activity tolerance is decreasing.  As of our PD screen on 02/21/23, He noted he was starting to feel the effects of the Parkinson's more - dizziness upon coming to standing and feeling weaker. Standing for ADLs cause him more fatigue than they used to, requiring seated rest breaks. He noted increased difficulty with UB dressing when trying to don a jacket. Walking requires more effort and he tires more easily. Feels more unsteady on his feet.   Pt accompanied by: self  PAIN: Are you having pain? Yes: NPRS scale: up to 9/10 in weightbearing Pain location: L buttock with some radicular pain  Pain description: pain and weakness  Aggravating factors: weightbearing/walking  Relieving factors: ibuprofen, rest (sitting or lying down)   PERTINENT HISTORY:  PPM 2 sick sinus syndrome/SA node dysfunction & complete heart block, orthostatic hypotension, COPD, OSA, HTN, DM2, Parkinson's Disease, CVA 03/2021, hypothyroidism, PTSD, bipolar I disorder   PRECAUTIONS: Fall and ICD/Pacemaker  RED FLAGS: None  WEIGHT BEARING RESTRICTIONS: No  FALLS:  Has patient fallen in last 6 months? No  LIVING ENVIRONMENT: Lives with: lives alone and with his dog Lives in: Other Independent living facility (3rd floor) Stairs:  Elevator Has following equipment at home: Single point cane, Environmental consultant - 4 wheeled, and hiking pole  OCCUPATION: Retired  PLOF: Independent and Leisure: being outdoors - walking his dog, and stays active most of the day  PATIENT GOALS: "To be better prepared for balance issues and be able to adjust to avoid falling."   OBJECTIVE:  (objective  measures completed at initial evaluation unless otherwise dated)  DIAGNOSTIC FINDINGS:  03/14/22 & 03/15/22 - CT Head w/o contrast:  IMPRESSION: 1. No evidence of acute intracranial abnormality. 2. Chronic small vessel ischemic disease and cerebral atrophy.  03/14/22 - CT Angio Head & Neck: IMPRESSION: 1. Negative CTA for large vessel occlusion or other emergent finding. 2. Mild for age atheromatous change about the carotid bifurcations and carotid siphons without hemodynamically significant stenosis.  COGNITION: Overall cognitive status: Within functional limits for tasks assessed   SENSATION: WFL Except neuropathy in his feet - most notable at night  COORDINATION: Gross motor WFL B UE/LE   POSTURE:  rounded shoulders, forward head, and flexed trunk   MUSCLE LENGTH: Hamstrings: mod tight B ITB: mild/mod tight B Piriformis: mild/mod tight B Hip flexors: mod/severe tight B Quads: mod tight B Heelcord: mod tight L, mild tight R   LOWER EXTREMITY ROM:    Grossly WFL with exceptions of limitation related to muscle tightness as above  LOWER EXTREMITY MMT:    MMT Right eval Left eval R 05/18/23 L 05/18/23  Hip flexion 4+ 4+ 5 5  Hip extension 4 ^ 4 ^ 4+ ^ 4+ ^  Hip abduction 4- 4- 4+ 4+  Hip adduction 4 4 4+ 4+  Hip internal rotation 4+ 4+ 5 5  Hip external rotation 4 4 4+ 4+  Knee flexion 5 5 5 5   Knee extension 4+ 5 5 5   Ankle dorsiflexion 5 4+ 5 5  Ankle plantarflexion      Ankle inversion      Ankle eversion      (Blank rows = not tested, ^ - limited ROM)  BED MOBILITY:  Sit to supine Modified independence Supine to sit Modified independence Rolling to Right Modified independence  TRANSFERS: Assistive device utilized: None  Sit to stand: Complete Independence Stand to sit: Complete Independence Chair to chair:    Floor:     GAIT: Distance walked: clinic distances Assistive device utilized: Single point cane Level of assistance: Modified  independence Gait pattern: step through pattern, decreased arm swing- Right, decreased arm swing- Left, and trunk flexed Comments: Pt reports he has been using his 4WW/rollator more commonly, especially outdoors  STAIRS:  Level of Assistance:     Psychologist, counselling Technique:   Number of Stairs:    Height of Stairs:   Comments:   FUNCTIONAL TESTS:  5 times sit to stand: 13.69 sec w/o UE assist  Timed up and go (TUG): 10.78 sec (Normal), 10.09 sec (Manual), 12.72 sec (Cognitive - unable to complete cognitive task w/o multiple errors) 10 meter walk test: 9.00 sec w/o AD; Gait speed = 3.64 ft/sec  Berg Balance Scale: 44/56; 37-45 significant (>80%) risk for falls (04/18/23)  Dynamic Gait Index: 17/30; < 19 = high risk fall (04/18/23)  04/18/2023  Berg = 44/56; scores of 37-45 indicate a significant (>80%) risk for falls FGA = 17/30; score of <19/30 indicate a high risk for falls Berg Balance Test  Sit to Stand Able to stand without using hands and stabilize independently   Standing Unsupported Able to stand safely 2 minutes   Sitting with Back Unsupported but Feet Supported on Floor or Stool Able to sit safely and securely 2 minutes   Stand to Sit Sits safely with minimal use of hands   Transfers Able to transfer safely, definite need of hands   Standing Unsupported with Eyes Closed Able to stand 10 seconds with supervision   Standing Unsupported with Feet  Together Able to place feet together independently and stand for 1 minute with supervision   From Standing, Reach Forward with Outstretched Arm Can reach forward >12 cm safely (5")   From Standing Position, Pick up Object from Floor Able to pick up shoe safely and easily   From Standing Position, Turn to Look Behind Over each Shoulder Looks behind one side only/other side shows less weight shift   Turn 360 Degrees Able to turn 360 degrees safely but slowly   Standing Unsupported, Alternately Place Feet on Step/Stool Able to stand  independently and safely and complete 8 steps in 20 seconds   Standing Unsupported, One Foot in Front Able to take small step independently and hold 30 seconds   Standing on One Leg Tries to lift leg/unable to hold 3 seconds but remains standing independently   Total Score 44   Berg comment: 37-45 significant risk for falls(>80%)   Functional Gait  Assessment  Gait Level Surface Walks 20 ft in less than 7 sec but greater than 5.5 sec, uses assistive device, slower speed, mild gait deviations, or deviates 6-10 in outside of the 12 in walkway width.   Change in Gait Speed Able to change speed, demonstrates mild gait deviations, deviates 6-10 in outside of the 12 in walkway width, or no gait deviations, unable to achieve a major change in velocity, or uses a change in velocity, or uses an assistive device.   Gait with Horizontal Head Turns Performs head turns smoothly with slight change in gait velocity (eg, minor disruption to smooth gait path), deviates 6-10 in outside 12 in walkway width, or uses an assistive device.   Gait with Vertical Head Turns Performs task with slight change in gait velocity (eg, minor disruption to smooth gait path), deviates 6 - 10 in outside 12 in walkway width or uses assistive device   Gait and Pivot Turn Pivot turns safely within 3 sec and stops quickly with no loss of balance.   Step Over Obstacle Is able to step over one shoe box (4.5 in total height) without changing gait speed. No evidence of imbalance.   Gait with Narrow Base of Support Ambulates less than 4 steps heel to toe or cannot perform without assistance.   Gait with Eyes Closed Walks 20 ft, slow speed, abnormal gait pattern, evidence for imbalance, deviates 10-15 in outside 12 in walkway width. Requires more than 9 sec to ambulate 20 ft.   Ambulating Backwards Walks 20 ft, slow speed, abnormal gait pattern, evidence for imbalance, deviates 10-15 in outside 12 in walkway width.   Steps Alternating feet, must  use rail.   Total Score 17   FGA comment: < 19 = high risk fall    05/15/23: Berg = 44/56; scores of 46-51 indicate a moderate (>50%) risk for falls Berg Balance Test  Sit to Stand Able to stand without using hands and stabilize independently   Standing Unsupported Able to stand safely 2 minutes   Sitting with Back Unsupported but Feet Supported on Floor or Stool Able to sit safely and securely 2 minutes   Stand to Sit Sits safely with minimal use of hands   Transfers Able to transfer safely, minor use of hands   Standing Unsupported with Eyes Closed Able to stand 10 seconds safely   Standing Unsupported with Feet Together Able to place feet together independently and stand for 1 minute with supervision   From Standing, Reach Forward with Outstretched Arm Can reach confidently >25 cm (10")  From Standing Position, Pick up Object from Floor Able to pick up shoe safely and easily   From Standing Position, Turn to Look Behind Over each Shoulder Looks behind one side only/other side shows less weight shift   Turn 360 Degrees Able to turn 360 degrees safely but slowly   Standing Unsupported, Alternately Place Feet on Step/Stool Able to stand independently and safely and complete 8 steps in 20 seconds   Standing Unsupported, One Foot in Front Able to take small step independently and hold 30 seconds   Standing on One Leg Able to lift leg independently and hold 5-10 seconds   Total Score 49   Berg comment: 46-51 moderate risk for falls (>50%)    Scores as of discharge from last PT episode - 09/07/2022: 5xSTS = 12.87 sec w/o UE assist TUG: Normal = 8.34 sec w/o AD; Manual = 9.16 sec; Cognitive = 14.06 sec : 6.44 sec w/o AD Gait speed: 5.09 ft/sec w/o AD Berg = 52/56; 52-55 lower fall risk (> 25%) FGA = 25/30; 25-28 = low risk fall   PATIENT SURVEYS:  ABC scale 980 / 1600 = 61.3 % (50-80% = moderate level of physical functioning); <69% indicates risk for recurrent falls in PD 05/22/23 -  1270 / 1600 = 79.4 %  Score as of discharge from last PT episode - 09/07/2022: ABC scale: 1170 / 1600 = 73.1 %    TODAY'S TREATMENT:   05/22/2023 - 10th visit PN THERAPEUTIC EXERCISE: To improve strength, endurance, and flexibility.  Demonstration, verbal and tactile cues throughout for technique. Rec Bike - L1 x 6 min Hooklying L KTOS piriformis stretch 2 x 30" Hooklying L figure-4 piriformis stretch with slight overpressure x 30" - preferred latter stretch Hooklying L figure-4 piriformis to chest stretch x 30" each with B LE and single LE lift - preferred single leg version with good stretch reported Seated L KTOS piriformis stretch x 30" - preferred hooklying version Seated L hip hinge figure-4 piriformis stretch x 30" - preferred hooklying version  MANUAL THERAPY: To promote normalized muscle tension, improved flexibility, and reduced pain utilizing connective tissue massage, therapeutic massage, and manual TP therapy.  STM/DTM and manual TPR to L glutes and esp piriformis Provided instruction in self-STM techniques to L piriformis/glutes using tennis ball in sitting on firm chair or standing against wall.   THERAPEUTIC ACTIVITIES: To improve functional performance.  Demonstration, verbal and tactile cues throughout for technique. ABC scale: 1270 / 1600 = 79.4 %   05/18/2023  THERAPEUTIC EXERCISE: To improve strength, endurance, and flexibility.  Demonstration, verbal and tactile cues throughout for technique.  NuStep - L6 x 6 minutes - UE/LE to promote reciprocal movement patterns  THERAPEUTIC ACTIVITIES: To improve functional performance.  Demonstration, verbal and tactile cues throughout for technique. LE MMT  NEUROMUSCULAR RE-EDUCATION: To improve balance, coordination, kinesthesia, posture, proprioception, reduce fall risk, amplitude of movement, speed of movement to reduce bradykinesia, and reduce rigidity. Alt PWR! Rock/Step to high targets on wall x 10 facing wall  Alt PWR!  Rock/Twist/Step to high targets on wall x 10 with back to wall PWR! Unilateral step through fwd and back with reciprocal arm swing x 5, attempted w/o UE support but more unstable today - added contralateral UE support on back of chair for balance but still with increased LOB (potentially due to fatigue from his activity yesterday) Standing PWR! Up with GTB resistance for shoulder extension 2 x 10 Standing alternating GTB shoulder extension to simulate reciprocal arm swing  2 x 10   05/15/2023  THERAPEUTIC EXERCISE: To improve strength and endurance.  Demonstration, verbal and tactile cues throughout for technique.  NuStep - L6 x 6 minutes - UE/LE to promote reciprocal movement patterns  PHYSICAL PERFORMANCE TEST or MEASUREMENT: Berg Balance Test  Sit to Stand Able to stand without using hands and stabilize independently   Standing Unsupported Able to stand safely 2 minutes   Sitting with Back Unsupported but Feet Supported on Floor or Stool Able to sit safely and securely 2 minutes   Stand to Sit Sits safely with minimal use of hands   Transfers Able to transfer safely, minor use of hands   Standing Unsupported with Eyes Closed Able to stand 10 seconds safely   Standing Unsupported with Feet Together Able to place feet together independently and stand for 1 minute with supervision   From Standing, Reach Forward with Outstretched Arm Can reach confidently >25 cm (10")   From Standing Position, Pick up Object from Floor Able to pick up shoe safely and easily   From Standing Position, Turn to Look Behind Over each Shoulder Looks behind one side only/other side shows less weight shift   Turn 360 Degrees Able to turn 360 degrees safely but slowly   Standing Unsupported, Alternately Place Feet on Step/Stool Able to stand independently and safely and complete 8 steps in 20 seconds   Standing Unsupported, One Foot in Front Able to take small step independently and hold 30 seconds   Standing on One Leg  Able to lift leg independently and hold 5-10 seconds   Total Score 49   Berg comment: 46-51 moderate risk for falls (>50%)      NEUROMUSCULAR RE-EDUCATION: To improve balance, coordination, kinesthesia, posture, proprioception, reduce fall risk, amplitude of movement, speed of movement to reduce bradykinesia, and reduce rigidity.  Prone PWR! Moves: Up x 10 - limited lowering from PWR! Up position Rock x 5 - limited by fatigue Twist x 10 Step x 10 PWR! Sit to stand from slight elevated hi-low table x 10 PWR! Walk 2 x 120' - focusing on upright posture and increased reciprocal arm swing - limited by fatigue and dizziness   PATIENT EDUCATION:  Education details: HEP update - piriformis stretches and self-STM techniques to L piriformis using tennis ball   Person educated: Patient Education method: Explanation, Demonstration, Verbal cues, and Handouts Education comprehension: verbalized understanding, returned demonstration, verbal cues required, and needs further education  HOME EXERCISE PROGRAM: Access Code: [email or web address] URL: [email or web address] Date: 05/22/2023 Prepared by: Felecia Hopper  Exercises - Hip Flexor Stretch with Strap on Table  - 2 x daily - 7 x weekly - 3 reps - 30 sec hold - Supine Hamstring Stretch with Strap  - 2 x daily - 7 x weekly - 3 reps - 30 sec hold - Standing Lumbar Extension at Wall - Forearms  - 2 x daily - 7 x weekly - 3 reps - 30 sec hold - Doorway Pec Stretch at 90 Degrees Abduction  - 2 x daily - 7 x weekly - 3 reps - 30 sec hold - Supine Piriformis Stretch with Foot on Ground (Mirrored)  - 2 x daily - 7 x weekly - 3 reps - 30 sec hold - Supine Figure 4 Piriformis Stretch with Leg Extension  - 2 x daily - 7 x weekly - 3 reps - 30 sec hold - Seated Piriformis Release With [employer] (Mirrored)  - 1-2 x daily - 7 x weekly - 1-2  min hold  PWR! Moves: Seated Standing   ASSESSMENT:  CLINICAL IMPRESSION: Petronilo Layman" has been  demonstrating good progress with PT, however arrives to PT today with c/o of acute sciatica resulting from a pivoting motion on his L hip on Saturday when he lost his shoe while trying to enter a chicken coop to feed the chickens.  L sciatica limiting his weightbearing and walking tolerance causing him to rely on his 4WW rather then his SPC.  As such, today's visit focus shifted to addressing abnormal muscle tension/spasm in his L piriformis > glutes with MT followed by stretching and instruction in self-STM techniques to piriformis.  We were able to reduce his pain and improve his walking tolerance by the end of the session, however insufficient time remained to complete the remainder of his LTG assessment.  He has recently demonstrated gains in his overall B LE strength, as well as a 5 point improvement on the Berg from 44/56 to 49/56 indicating a reduction in fall risk from significant (>80%) to moderate (>50%).  ABC scale score has improved from 61.3% balance confidence to 79.4%, meeting and exceeding LTG #6.  Raenette Bumps is progressing well toward his PT goals and will benefit from continued skilled PT to address ongoing strength, posture and motor control deficits to improve mobility and activity tolerance with decreased risk for falls.   OBJECTIVE IMPAIRMENTS: Abnormal gait, decreased activity tolerance, decreased balance, decreased coordination, decreased endurance, decreased knowledge of use of DME, decreased mobility, difficulty walking, decreased ROM, decreased strength, decreased safety awareness, dizziness, impaired perceived functional ability, impaired flexibility, improper body mechanics, postural dysfunction, and pain.   ACTIVITY LIMITATIONS: carrying, lifting, standing, squatting, stairs, transfers, bed mobility, reach over head, locomotion level, and caring for others   PARTICIPATION LIMITATIONS: meal prep, cleaning, laundry, driving, shopping, and community activity   PERSONAL FACTORS: Age,  Past/current experiences, Time since onset of injury/illness/exacerbation, and 3+ comorbidities: PPM 2 sick sinus syndrome/SA node dysfunction & complete heart block, orthostatic hypotension, COPD, OSA, HTN, DM2, Parkinson's Disease, CVA 03/2021, hypothyroidism, PTSD, bipolar I disorder   are also affecting patient's functional outcome.    REHAB POTENTIAL: Good   CLINICAL DECISION MAKING: Evolving/moderate complexity   EVALUATION COMPLEXITY: Moderate   GOALS: Goals reviewed with patient? Yes  SHORT TERM GOALS: Target date: 05/18/2023   Complete remaining standardized balance assessment and update LTG's accordingly Baseline: Berg and FGA TBA Goal status: MET - 04/18/23  Assess need for new rollator/4WW and provide recommendations to MD for DME prescription.  Baseline:  Goal status: MET - 05/22/23 - Pt reports he is pleased with his recent 4WW customization and does not feel need to order a new 4WW/rollator.  PT assessed modification and walker appears safe and sturdy.  Patient will be independent with initial HEP Baseline:  Goal status: MET - 05/10/23  Patient will demonstrate improved B proximal LE strength to >/= 4+/5 for improved stability and ease of mobility. Baseline: Refer to above LE MMT table Goal status: MET - 05/18/23  LONG TERM GOALS: Target date: 06/29/2023  Patient will be independent with ongoing/advanced HEP for self-management at home incorporating PWR! Moves as indicated.  Baseline:  Goal status: PARTIALLY MET - 05/22/23 - Met for current HEP  2.  Patient will be able to ambulate at least 800 ft with LRAD including outdoor and unlevel surfaces, independently with no LOB, for improved community gait.   Baseline:  Goal status: IN PROGRESS - 05/22/23 - deferred assessment today due to acute L sciatica  3.  Patient will be able to step up/down curb safely with LRAD for safety with community ambulation.  Baseline:  Goal status: IN PROGRESS - 05/22/23 - deferred assessment today  due to acute L sciatica  4.  Patient will demonstrate at least 4 point improvement on FGA to improve gait stability and reduce risk for falls.  Baseline: 17/30 (04/18/23) Goal status: IN PROGRESS - 05/22/23 - deferred assessment today due to acute L sciatica  5.  Patient will improve Berg score by at least 8 points to improve safety and stability with ADLs in standing and reduce risk for falls.    Baseline: 44/56 (04/18/23) Goal status: IN PROGRESS - 05/15/23 - 49/56  6.  Patient will report >/= 70% on ABC scale to demonstrate improved balance confidence and decreased risk for falls. Baseline: 980 / 1600 = 61.3 %  Goal status: MET - 05/22/23 - 1270 / 1600 = 79.4 %  7. Patient will verbalize understanding of local Parkinson's disease community resources, including community fitness post d/c. Baseline:  Goal status: IN PROGRESS   PLAN:  PT FREQUENCY: 2x/week  PT DURATION: 8 weeks  PLANNED INTERVENTIONS: [id number]- PT Re-evaluation, 97110-Therapeutic exercises, 97530- Therapeutic activity, 97112- Neuromuscular re-education, 97535- Self Care, 40981- Manual therapy, [phone]- Gait training, [phone]- Electrical stimulation (unattended), 97035- Ultrasound, 21308- Ionotophoresis 4mg /ml Dexamethasone, Patient/Family education, Balance training, Stair training, Taping, Dry Needling, Joint mobilization, DME instructions, Cryotherapy, Moist heat, and 97750- Physical Performance Test or Measurement  PLAN FOR NEXT SESSION: FGA reassessment, assess gait outdoors weather permitting; progress postural and proximal LE flexibility and core/proximal LE strengthening - update/modify HEP as indicated; progress PWR! Moves   [place], [place] 05/22/2023, 12:54 PM

## 2023-05-24 ENCOUNTER — Ambulatory Visit: Admitting: Physical Therapy

## 2023-05-24 DIAGNOSIS — R2689 Other abnormalities of gait and mobility: Secondary | ICD-10-CM | POA: Diagnosis not present

## 2023-05-24 DIAGNOSIS — R2681 Unsteadiness on feet: Secondary | ICD-10-CM

## 2023-05-24 DIAGNOSIS — R293 Abnormal posture: Secondary | ICD-10-CM

## 2023-05-24 DIAGNOSIS — M6281 Muscle weakness (generalized): Secondary | ICD-10-CM

## 2023-05-24 NOTE — Therapy (Signed)
 OUTPATIENT PHYSICAL THERAPY TREATMENT    Patient Name: Victor Osborne MRN: [id number] DOB:Nov 15, 1932, 89 y.o., male Today's Date: 05/24/2023   END OF SESSION:  PT End of Session - 05/24/23 1105     Visit Number 11    Date for PT Re-Evaluation 06/29/23    Authorization Type Medicare & [employer]    Progress Note Due on Visit 10    PT Start Time 1101    PT Stop Time 1140    PT Time Calculation (min) 39 min    Activity Tolerance Patient tolerated treatment well    Behavior During Therapy WFL for tasks assessed/performed              Past Medical History:  Diagnosis Date   Bipolar 1 disorder (HCC)    COPD (chronic obstructive pulmonary disease) (HCC)    Depression    First degree AV block    Hypertension    pt denies but was in medical record   Hypoglycemia, unspecified    Myalgia and myositis, unspecified    Obesity    Pneumonia 1970   Skin cancer (melanoma) (HCC)    Thyroid  disease    Unspecified hypothyroidism    UPJ obstruction, congenital    Past Surgical History:  Procedure Laterality Date   APPENDECTOMY  1939   malignant melanoma removed from right forehead  2008   PERMANENT PACEMAKER INSERTION N/A 06/19/2013   Procedure: PERMANENT PACEMAKER INSERTION;  Surgeon: Tammie Fall, MD;  Location: [facility] CATH LAB;  Service: Cardiovascular;  Laterality: N/A;   PPM GENERATOR CHANGEOUT N/A 09/28/2022   Procedure: PPM GENERATOR CHANGEOUT;  Surgeon: Tammie Fall, MD;  Location: [facility] INVASIVE CV LAB;  Service: Cardiovascular;  Laterality: N/A;   right big toe  surgery  2009   TRANSURETHRAL RESECTION OF PROSTATE  02/04/2011   Procedure: TRANSURETHRAL RESECTION OF THE PROSTATE (TURP);  Surgeon: Kristeen Peto, MD;  Location: [facility] ORS;  Service: Urology;  Laterality: N/A;   URETHRA SURGERY  2010   reconstruction   Patient Active Problem List   Diagnosis Date Noted   Hyperlipidemia 03/15/2022   TIA (transient ischemic attack) 03/14/2022   History of neoplasm 04/12/2021    Melanocytic nevi of trunk 04/12/2021   Other seborrheic keratosis 04/12/2021   Cerebral embolism with cerebral infarction 03/31/2021   Left leg weakness 03/30/2021   Chronic post-traumatic stress disorder 12/05/2017   Bipolar I disorder, current or most recent episode depressed, in partial remission (HCC) 12/05/2017   CHB (complete heart block) (HCC) 01/27/2017   HTN (hypertension) 01/27/2017   Hypothyroidism 10/11/2016   Parkinson disease (HCC) 09/08/2016   Chronic osteoarthritis 03/20/2015   Bipolar 1 disorder (HCC) 03/20/2015   Tremor 03/20/2015   Pacemaker 09/24/2013   SSS (sick sinus syndrome) (HCC) 06/19/2013   First degree AV block 04/09/2013   Allergic rhinitis 03/17/2013   Obstructive sleep apnea 12/18/2006   COPD mixed type (HCC) 12/18/2006   History of malignant melanoma of skin 12/18/2006   Diabetes mellitus (HCC) 12/18/2006    PCP: Adra Alanis, FNP   REFERRING PROVIDER: Shirline Dover, DO   REFERRING DIAG: G20.A1 (ICD-10-CM) - Parkinson's disease without dyskinesia or fluctuating manifestations (HCC)   THERAPY DIAG:  Other abnormalities of gait and mobility  Unsteadiness on feet  Muscle weakness (generalized)  Abnormal posture  RATIONALE FOR EVALUATION AND TREATMENT: Rehabilitation  ONSET DATE: Initial PD diagnosis >15 yrs ago, worsening symptoms over past few months  NEXT MD VISIT: 08/24/2023   SUBJECTIVE:  SUBJECTIVE STATEMENT: Pt states sciatic nerve is still bothering him but the exercises did help from prior session. It is now at 7 1/2 out of 10. When he's tired it hurts more.   EVAL:  Pt reports he feels that he needs to use the rollator more now than previously and notes more effort when tries to go without the walker. He would like to use  a 4WW with bigger wheels - he found one that needed fixing up and he has been using this, but would prefer a new one.  He feels like his legs are getting weaker and his activity tolerance is decreasing.  As of our PD screen on 02/21/23, He noted he was starting to feel the effects of the Parkinson's more - dizziness upon coming to standing and feeling weaker. Standing for ADLs cause him more fatigue than they used to, requiring seated rest breaks. He noted increased difficulty with UB dressing when trying to don a jacket. Walking requires more effort and he tires more easily. Feels more unsteady on his feet.   Pt accompanied by: self  PAIN: Are you having pain? Yes: NPRS scale: up to 7.5/10 in weightbearing Pain location: L buttock with some radicular pain  Pain description: pain and weakness  Aggravating factors: weightbearing/walking  Relieving factors: ibuprofen, rest (sitting or lying down)   PERTINENT HISTORY:  PPM 2 sick sinus syndrome/SA node dysfunction & complete heart block, orthostatic hypotension, COPD, OSA, HTN, DM2, Parkinson's Disease, CVA 03/2021, hypothyroidism, PTSD, bipolar I disorder   PRECAUTIONS: Fall and ICD/Pacemaker  RED FLAGS: None  WEIGHT BEARING RESTRICTIONS: No  FALLS:  Has patient fallen in last 6 months? No  LIVING ENVIRONMENT: Lives with: lives alone and with his dog Lives in: Other Independent living facility (3rd floor) Stairs:  Elevator Has following equipment at home: Single point cane, Environmental consultant - 4 wheeled, and hiking pole  OCCUPATION: Retired  PLOF: Independent and Leisure: being outdoors - walking his dog, and stays active most of the day  PATIENT GOALS: "To be better prepared for balance issues and be able to adjust to avoid falling."   OBJECTIVE: (objective measures completed at initial evaluation unless otherwise dated)  DIAGNOSTIC FINDINGS:  03/14/22 & 03/15/22 - CT Head w/o contrast:  IMPRESSION: 1. No evidence of acute intracranial  abnormality. 2. Chronic small vessel ischemic disease and cerebral atrophy.  03/14/22 - CT Angio Head & Neck: IMPRESSION: 1. Negative CTA for large vessel occlusion or other emergent finding. 2. Mild for age atheromatous change about the carotid bifurcations and carotid siphons without hemodynamically significant stenosis.  COGNITION: Overall cognitive status: Within functional limits for tasks assessed   SENSATION: WFL Except neuropathy in his feet - most notable at night  COORDINATION: Gross motor WFL B UE/LE   POSTURE:  rounded shoulders, forward head, and flexed trunk   MUSCLE LENGTH: Hamstrings: mod tight B ITB: mild/mod tight B Piriformis: mild/mod tight B Hip flexors: mod/severe tight B Quads: mod tight B Heelcord: mod tight L, mild tight R   LOWER EXTREMITY ROM:    Grossly WFL with exceptions of limitation related to muscle tightness as above  LOWER EXTREMITY MMT:    MMT Right eval Left eval R 05/18/23 L 05/18/23  Hip flexion 4+ 4+ 5 5  Hip extension 4 ^ 4 ^ 4+ ^ 4+ ^  Hip abduction 4- 4- 4+ 4+  Hip adduction 4 4 4+ 4+  Hip internal rotation 4+ 4+ 5 5  Hip external rotation 4 4 4+  4+  Knee flexion 5 5 5 5   Knee extension 4+ 5 5 5   Ankle dorsiflexion 5 4+ 5 5  Ankle plantarflexion      Ankle inversion      Ankle eversion      (Blank rows = not tested, ^ - limited ROM)  BED MOBILITY:  Sit to supine Modified independence Supine to sit Modified independence Rolling to Right Modified independence  TRANSFERS: Assistive device utilized: None  Sit to stand: Complete Independence Stand to sit: Complete Independence Chair to chair:    Floor:     GAIT: Distance walked: clinic distances Assistive device utilized: Single point cane Level of assistance: Modified independence Gait pattern: step through pattern, decreased arm swing- Right, decreased arm swing- Left, and trunk flexed Comments: Pt reports he has been using his 4WW/rollator more commonly, especially  outdoors  STAIRS:  Level of Assistance:     Psychologist, counselling Technique:   Number of Stairs:    Height of Stairs:   Comments:   FUNCTIONAL TESTS:  5 times sit to stand: 13.69 sec w/o UE assist  Timed up and go (TUG): 10.78 sec (Normal), 10.09 sec (Manual), 12.72 sec (Cognitive - unable to complete cognitive task w/o multiple errors) 10 meter walk test: 9.00 sec w/o AD; Gait speed = 3.64 ft/sec  Berg Balance Scale: 44/56; 37-45 significant (>80%) risk for falls (04/18/23)  Dynamic Gait Index: 17/30; < 19 = high risk fall (04/18/23)  04/18/2023  Berg = 44/56; scores of 37-45 indicate a significant (>80%) risk for falls FGA = 17/30; score of <19/30 indicate a high risk for falls Berg Balance Test  Sit to Stand Able to stand without using hands and stabilize independently   Standing Unsupported Able to stand safely 2 minutes   Sitting with Back Unsupported but Feet Supported on Floor or Stool Able to sit safely and securely 2 minutes   Stand to Sit Sits safely with minimal use of hands   Transfers Able to transfer safely, definite need of hands   Standing Unsupported with Eyes Closed Able to stand 10 seconds with supervision   Standing Unsupported with Feet Together Able to place feet together independently and stand for 1 minute with supervision   From Standing, Reach Forward with Outstretched Arm Can reach forward >12 cm safely (5")   From Standing Position, Pick up Object from Floor Able to pick up shoe safely and easily   From Standing Position, Turn to Look Behind Over each Shoulder Looks behind one side only/other side shows less weight shift   Turn 360 Degrees Able to turn 360 degrees safely but slowly   Standing Unsupported, Alternately Place Feet on Step/Stool Able to stand independently and safely and complete 8 steps in 20 seconds   Standing Unsupported, One Foot in Front Able to take small step independently and hold 30 seconds   Standing on One Leg Tries to lift leg/unable to hold  3 seconds but remains standing independently   Total Score 44   Berg comment: 37-45 significant risk for falls(>80%)   Functional Gait  Assessment  Gait Level Surface Walks 20 ft in less than 7 sec but greater than 5.5 sec, uses assistive device, slower speed, mild gait deviations, or deviates 6-10 in outside of the 12 in walkway width.   Change in Gait Speed Able to change speed, demonstrates mild gait deviations, deviates 6-10 in outside of the 12 in walkway width, or no gait deviations, unable to achieve a major change in  velocity, or uses a change in velocity, or uses an assistive device.   Gait with Horizontal Head Turns Performs head turns smoothly with slight change in gait velocity (eg, minor disruption to smooth gait path), deviates 6-10 in outside 12 in walkway width, or uses an assistive device.   Gait with Vertical Head Turns Performs task with slight change in gait velocity (eg, minor disruption to smooth gait path), deviates 6 - 10 in outside 12 in walkway width or uses assistive device   Gait and Pivot Turn Pivot turns safely within 3 sec and stops quickly with no loss of balance.   Step Over Obstacle Is able to step over one shoe box (4.5 in total height) without changing gait speed. No evidence of imbalance.   Gait with Narrow Base of Support Ambulates less than 4 steps heel to toe or cannot perform without assistance.   Gait with Eyes Closed Walks 20 ft, slow speed, abnormal gait pattern, evidence for imbalance, deviates 10-15 in outside 12 in walkway width. Requires more than 9 sec to ambulate 20 ft.   Ambulating Backwards Walks 20 ft, slow speed, abnormal gait pattern, evidence for imbalance, deviates 10-15 in outside 12 in walkway width.   Steps Alternating feet, must use rail.   Total Score 17   FGA comment: < 19 = high risk fall    05/15/23: Berg = 44/56; scores of 46-51 indicate a moderate (>50%) risk for falls Berg Balance Test  Sit to Stand Able to stand without using  hands and stabilize independently   Standing Unsupported Able to stand safely 2 minutes   Sitting with Back Unsupported but Feet Supported on Floor or Stool Able to sit safely and securely 2 minutes   Stand to Sit Sits safely with minimal use of hands   Transfers Able to transfer safely, minor use of hands   Standing Unsupported with Eyes Closed Able to stand 10 seconds safely   Standing Unsupported with Feet Together Able to place feet together independently and stand for 1 minute with supervision   From Standing, Reach Forward with Outstretched Arm Can reach confidently >25 cm (10")   From Standing Position, Pick up Object from Floor Able to pick up shoe safely and easily   From Standing Position, Turn to Look Behind Over each Shoulder Looks behind one side only/other side shows less weight shift   Turn 360 Degrees Able to turn 360 degrees safely but slowly   Standing Unsupported, Alternately Place Feet on Step/Stool Able to stand independently and safely and complete 8 steps in 20 seconds   Standing Unsupported, One Foot in Front Able to take small step independently and hold 30 seconds   Standing on One Leg Able to lift leg independently and hold 5-10 seconds   Total Score 49   Berg comment: 46-51 moderate risk for falls (>50%)    Scores as of discharge from last PT episode - 09/07/2022: 5xSTS = 12.87 sec w/o UE assist TUG: Normal = 8.34 sec w/o AD; Manual = 9.16 sec; Cognitive = 14.06 sec : 6.44 sec w/o AD Gait speed: 5.09 ft/sec w/o AD Berg = 52/56; 52-55 lower fall risk (> 25%) FGA = 25/30; 25-28 = low risk fall   PATIENT SURVEYS:  ABC scale 980 / 1600 = 61.3 % (50-80% = moderate level of physical functioning); <69% indicates risk for recurrent falls in PD 05/22/23 - 1270 / 1600 = 79.4 %  Score as of discharge from last PT episode - 09/07/2022:  ABC scale: 1170 / 1600 = 73.1 %    TODAY'S TREATMENT:  05/24/2023 THERAPEUTIC EXERCISE: Nustep L5 x 5 min UEs/LEs Standing lateral  shift into wall x10 Hooklying piriformis stretch 2x 30" Supine ITB stretch with strap 2x30" Supine adductor/hamstring stretch with strap x60" Seated adductor/hamstring stretch x 30"  NEUROMUSCULAR RE-ED: Prone hip IR/ER to inhibit muscle activation x10 Seated hip IR/ER to inhibit muscle activation x10 Hip hinge 2x10 Backwards/fwds step and rock x10  MANUAL THERAPY STM & TPR Medial hamstring and adductor proximal insertion along ischial tuberosities   05/22/2023 - 10th visit PN THERAPEUTIC EXERCISE: To improve strength, endurance, and flexibility.  Demonstration, verbal and tactile cues throughout for technique. Rec Bike - L1 x 6 min Hooklying L KTOS piriformis stretch 2 x 30" Hooklying L figure-4 piriformis stretch with slight overpressure x 30" - preferred latter stretch Hooklying L figure-4 piriformis to chest stretch x 30" each with B LE and single LE lift - preferred single leg version with good stretch reported Seated L KTOS piriformis stretch x 30" - preferred hooklying version Seated L hip hinge figure-4 piriformis stretch x 30" - preferred hooklying version  MANUAL THERAPY: To promote normalized muscle tension, improved flexibility, and reduced pain utilizing connective tissue massage, therapeutic massage, and manual TP therapy.  STM/DTM and manual TPR to L glutes and esp piriformis Provided instruction in self-STM techniques to L piriformis/glutes using tennis ball in sitting on firm chair or standing against wall.   THERAPEUTIC ACTIVITIES: To improve functional performance.  Demonstration, verbal and tactile cues throughout for technique. ABC scale: 1270 / 1600 = 79.4 %   05/18/2023  THERAPEUTIC EXERCISE: To improve strength, endurance, and flexibility.  Demonstration, verbal and tactile cues throughout for technique.  NuStep - L6 x 6 minutes - UE/LE to promote reciprocal movement patterns  THERAPEUTIC ACTIVITIES: To improve functional performance.  Demonstration, verbal  and tactile cues throughout for technique. LE MMT  NEUROMUSCULAR RE-EDUCATION: To improve balance, coordination, kinesthesia, posture, proprioception, reduce fall risk, amplitude of movement, speed of movement to reduce bradykinesia, and reduce rigidity. Alt PWR! Rock/Step to high targets on wall x 10 facing wall  Alt PWR! Rock/Twist/Step to high targets on wall x 10 with back to wall PWR! Unilateral step through fwd and back with reciprocal arm swing x 5, attempted w/o UE support but more unstable today - added contralateral UE support on back of chair for balance but still with increased LOB (potentially due to fatigue from his activity yesterday) Standing PWR! Up with GTB resistance for shoulder extension 2 x 10 Standing alternating GTB shoulder extension to simulate reciprocal arm swing 2 x 10   05/15/2023  THERAPEUTIC EXERCISE: To improve strength and endurance.  Demonstration, verbal and tactile cues throughout for technique.  NuStep - L6 x 6 minutes - UE/LE to promote reciprocal movement patterns  PHYSICAL PERFORMANCE TEST or MEASUREMENT: Berg Balance Test  Sit to Stand Able to stand without using hands and stabilize independently   Standing Unsupported Able to stand safely 2 minutes   Sitting with Back Unsupported but Feet Supported on Floor or Stool Able to sit safely and securely 2 minutes   Stand to Sit Sits safely with minimal use of hands   Transfers Able to transfer safely, minor use of hands   Standing Unsupported with Eyes Closed Able to stand 10 seconds safely   Standing Unsupported with Feet Together Able to place feet together independently and stand for 1 minute with supervision   From Standing, Reach  Forward with Outstretched Arm Can reach confidently >25 cm (10")   From Standing Position, Pick up Object from Floor Able to pick up shoe safely and easily   From Standing Position, Turn to Look Behind Over each Shoulder Looks behind one side only/other side shows less  weight shift   Turn 360 Degrees Able to turn 360 degrees safely but slowly   Standing Unsupported, Alternately Place Feet on Step/Stool Able to stand independently and safely and complete 8 steps in 20 seconds   Standing Unsupported, One Foot in Front Able to take small step independently and hold 30 seconds   Standing on One Leg Able to lift leg independently and hold 5-10 seconds   Total Score 49   Berg comment: 46-51 moderate risk for falls (>50%)      NEUROMUSCULAR RE-EDUCATION: To improve balance, coordination, kinesthesia, posture, proprioception, reduce fall risk, amplitude of movement, speed of movement to reduce bradykinesia, and reduce rigidity.  Prone PWR! Moves: Up x 10 - limited lowering from PWR! Up position Rock x 5 - limited by fatigue Twist x 10 Step x 10 PWR! Sit to stand from slight elevated hi-low table x 10 PWR! Walk 2 x 120' - focusing on upright posture and increased reciprocal arm swing - limited by fatigue and dizziness   PATIENT EDUCATION:  Education details: HEP update - piriformis stretches and self-STM techniques to L piriformis using tennis ball   Person educated: Patient Education method: Explanation, Demonstration, Verbal cues, and Handouts Education comprehension: verbalized understanding, returned demonstration, verbal cues required, and needs further education  HOME EXERCISE PROGRAM: Access Code: [email or web address] URL: [email or web address] Date: 05/22/2023 Prepared by: Felecia Hopper  Exercises - Hip Flexor Stretch with Strap on Table  - 2 x daily - 7 x weekly - 3 reps - 30 sec hold - Supine Hamstring Stretch with Strap  - 2 x daily - 7 x weekly - 3 reps - 30 sec hold - Standing Lumbar Extension at Wall - Forearms  - 2 x daily - 7 x weekly - 3 reps - 30 sec hold - Doorway Pec Stretch at 90 Degrees Abduction  - 2 x daily - 7 x weekly - 3 reps - 30 sec hold - Supine Piriformis Stretch with Foot on Ground (Mirrored)  - 2 x daily - 7 x weekly - 3  reps - 30 sec hold - Supine Figure 4 Piriformis Stretch with Leg Extension  - 2 x daily - 7 x weekly - 3 reps - 30 sec hold - Seated Piriformis Release With [employer] (Mirrored)  - 1-2 x daily - 7 x weekly - 1-2 min hold  PWR! Moves: Seated Standing   ASSESSMENT:  CLINICAL IMPRESSION: Mivaan Stills" continues to have some residual sciatic pain. Found to have continued large trigger point and muscle tightness in his proximal adductor/hamstrings. Provided new stretches to address this. Able to work on more standing weight shifts and balance by end of session with pt feeling that his muscle is more relaxed.    has been demonstrating good progress with PT, however arrives to PT today with c/o of acute sciatica resulting from a pivoting motion on his L hip on Saturday when he lost his shoe while trying to enter a chicken coop to feed the chickens.  L sciatica limiting his weightbearing and walking tolerance causing him to rely on his 4WW rather then his SPC.  As such, today's visit focus shifted to addressing abnormal muscle tension/spasm in his L piriformis > glutes  with MT followed by stretching and instruction in self-STM techniques to piriformis.  We were able to reduce his pain and improve his walking tolerance by the end of the session, however insufficient time remained to complete the remainder of his LTG assessment.  He has recently demonstrated gains in his overall B LE strength, as well as a 5 point improvement on the Berg from 44/56 to 49/56 indicating a reduction in fall risk from significant (>80%) to moderate (>50%).  ABC scale score has improved from 61.3% balance confidence to 79.4%, meeting and exceeding LTG #6.  Raenette Bumps is progressing well toward his PT goals and will benefit from continued skilled PT to address ongoing strength, posture and motor control deficits to improve mobility and activity tolerance with decreased risk for falls.   OBJECTIVE IMPAIRMENTS: Abnormal gait, decreased  activity tolerance, decreased balance, decreased coordination, decreased endurance, decreased knowledge of use of DME, decreased mobility, difficulty walking, decreased ROM, decreased strength, decreased safety awareness, dizziness, impaired perceived functional ability, impaired flexibility, improper body mechanics, postural dysfunction, and pain.   ACTIVITY LIMITATIONS: carrying, lifting, standing, squatting, stairs, transfers, bed mobility, reach over head, locomotion level, and caring for others   PARTICIPATION LIMITATIONS: meal prep, cleaning, laundry, driving, shopping, and community activity   PERSONAL FACTORS: Age, Past/current experiences, Time since onset of injury/illness/exacerbation, and 3+ comorbidities: PPM 2 sick sinus syndrome/SA node dysfunction & complete heart block, orthostatic hypotension, COPD, OSA, HTN, DM2, Parkinson's Disease, CVA 03/2021, hypothyroidism, PTSD, bipolar I disorder   are also affecting patient's functional outcome.    REHAB POTENTIAL: Good   CLINICAL DECISION MAKING: Evolving/moderate complexity   EVALUATION COMPLEXITY: Moderate   GOALS: Goals reviewed with patient? Yes  SHORT TERM GOALS: Target date: 05/18/2023   Complete remaining standardized balance assessment and update LTG's accordingly Baseline: Berg and FGA TBA Goal status: MET - 04/18/23  Assess need for new rollator/4WW and provide recommendations to MD for DME prescription.  Baseline:  Goal status: MET - 05/22/23 - Pt reports he is pleased with his recent 4WW customization and does not feel need to order a new 4WW/rollator.  PT assessed modification and walker appears safe and sturdy.  Patient will be independent with initial HEP Baseline:  Goal status: MET - 05/10/23  Patient will demonstrate improved B proximal LE strength to >/= 4+/5 for improved stability and ease of mobility. Baseline: Refer to above LE MMT table Goal status: MET - 05/18/23  LONG TERM GOALS: Target date:  06/29/2023  Patient will be independent with ongoing/advanced HEP for self-management at home incorporating PWR! Moves as indicated.  Baseline:  Goal status: PARTIALLY MET - 05/22/23 - Met for current HEP  2.  Patient will be able to ambulate at least 800 ft with LRAD including outdoor and unlevel surfaces, independently with no LOB, for improved community gait.   Baseline:  Goal status: IN PROGRESS - 05/22/23 - deferred assessment today due to acute L sciatica  3.  Patient will be able to step up/down curb safely with LRAD for safety with community ambulation.  Baseline:  Goal status: IN PROGRESS - 05/22/23 - deferred assessment today due to acute L sciatica  4.  Patient will demonstrate at least 4 point improvement on FGA to improve gait stability and reduce risk for falls.  Baseline: 17/30 (04/18/23) Goal status: IN PROGRESS - 05/22/23 - deferred assessment today due to acute L sciatica  5.  Patient will improve Berg score by at least 8 points to improve safety and stability with  ADLs in standing and reduce risk for falls.    Baseline: 44/56 (04/18/23) Goal status: IN PROGRESS - 05/15/23 - 49/56  6.  Patient will report >/= 70% on ABC scale to demonstrate improved balance confidence and decreased risk for falls. Baseline: 980 / 1600 = 61.3 %  Goal status: MET - 05/22/23 - 1270 / 1600 = 79.4 %  7. Patient will verbalize understanding of local Parkinson's disease community resources, including community fitness post d/c. Baseline:  Goal status: IN PROGRESS   PLAN:  PT FREQUENCY: 2x/week  PT DURATION: 8 weeks  PLANNED INTERVENTIONS: 97164- PT Re-evaluation, 97110-Therapeutic exercises, 97530- Therapeutic activity, 97112- Neuromuscular re-education, 97535- Self Care, [id number]- Manual therapy, [phone]- Gait training, [phone]- Electrical stimulation (unattended), 97035- Ultrasound, 43329- Ionotophoresis 4mg /ml Dexamethasone, Patient/Family education, Balance training, Stair training, Taping, Dry Needling,  Joint mobilization, DME instructions, Cryotherapy, Moist heat, and 97750- Physical Performance Test or Measurement  PLAN FOR NEXT SESSION: FGA reassessment, assess gait outdoors weather permitting; progress postural and proximal LE flexibility and core/proximal LE strengthening - update/modify HEP as indicated; progress PWR! Moves   Julien Oscar April Ma L Venezia Sargeant, PT 05/24/2023, 11:05 AM

## 2023-05-29 ENCOUNTER — Ambulatory Visit: Admitting: Physical Therapy

## 2023-05-29 ENCOUNTER — Encounter: Payer: Self-pay | Admitting: Physical Therapy

## 2023-05-29 DIAGNOSIS — M6281 Muscle weakness (generalized): Secondary | ICD-10-CM

## 2023-05-29 DIAGNOSIS — R2681 Unsteadiness on feet: Secondary | ICD-10-CM

## 2023-05-29 DIAGNOSIS — R2689 Other abnormalities of gait and mobility: Secondary | ICD-10-CM

## 2023-05-29 DIAGNOSIS — R293 Abnormal posture: Secondary | ICD-10-CM

## 2023-05-29 NOTE — Therapy (Signed)
 OUTPATIENT PHYSICAL THERAPY TREATMENT    Patient Name: Victor Osborne MRN: [id number] DOB:[birth year], 89 y.o., male Today's Date: 05/29/2023   END OF SESSION:  PT End of Session - 05/29/23 1402     Visit Number 12    Date for PT Re-Evaluation 06/29/23    Authorization Type Medicare & [employer]    Progress Note Due on Visit 10    PT Start Time 1402    PT Stop Time 1443    PT Time Calculation (min) 41 min    Activity Tolerance Patient tolerated treatment well    Behavior During Therapy WFL for tasks assessed/performed               Past Medical History:  Diagnosis Date   Bipolar 1 disorder (HCC)    COPD (chronic obstructive pulmonary disease) (HCC)    Depression    First degree AV block    Hypertension    pt denies but was in medical record   Hypoglycemia, unspecified    Myalgia and myositis, unspecified    Obesity    Pneumonia 1970   Skin cancer (melanoma) (HCC)    Thyroid  disease    Unspecified hypothyroidism    UPJ obstruction, congenital    Past Surgical History:  Procedure Laterality Date   APPENDECTOMY  1939   malignant melanoma removed from right forehead  2008   PERMANENT PACEMAKER INSERTION N/A 06/19/2013   Procedure: PERMANENT PACEMAKER INSERTION;  Surgeon: Tammie Fall, MD;  Location: [facility] CATH LAB;  Service: Cardiovascular;  Laterality: N/A;   PPM GENERATOR CHANGEOUT N/A 09/28/2022   Procedure: PPM GENERATOR CHANGEOUT;  Surgeon: Tammie Fall, MD;  Location: [facility] INVASIVE CV LAB;  Service: Cardiovascular;  Laterality: N/A;   right big toe  surgery  2009   TRANSURETHRAL RESECTION OF PROSTATE  02/04/2011   Procedure: TRANSURETHRAL RESECTION OF THE PROSTATE (TURP);  Surgeon: Kristeen Peto, MD;  Location: [facility] ORS;  Service: Urology;  Laterality: N/A;   URETHRA SURGERY  2010   reconstruction   Patient Active Problem List   Diagnosis Date Noted   Hyperlipidemia 03/15/2022   TIA (transient ischemic attack) 03/14/2022   History of neoplasm  04/12/2021   Melanocytic nevi of trunk 04/12/2021   Other seborrheic keratosis 04/12/2021   Cerebral embolism with cerebral infarction 03/31/2021   Left leg weakness 03/30/2021   Chronic post-traumatic stress disorder 12/05/2017   Bipolar I disorder, current or most recent episode depressed, in partial remission (HCC) 12/05/2017   CHB (complete heart block) (HCC) 01/27/2017   HTN (hypertension) 01/27/2017   Hypothyroidism 10/11/2016   Parkinson disease (HCC) 09/08/2016   Chronic osteoarthritis 03/20/2015   Bipolar 1 disorder (HCC) 03/20/2015   Tremor 03/20/2015   Pacemaker 09/24/2013   SSS (sick sinus syndrome) (HCC) 06/19/2013   First degree AV block 04/09/2013   Allergic rhinitis 03/17/2013   Obstructive sleep apnea 12/18/2006   COPD mixed type (HCC) 12/18/2006   History of malignant melanoma of skin 12/18/2006   Diabetes mellitus (HCC) 12/18/2006    PCP: Adra Alanis, FNP   REFERRING PROVIDER: Shirline Dover, DO   REFERRING DIAG: G20.A1 (ICD-10-CM) - Parkinson's disease without dyskinesia or fluctuating manifestations (HCC)   THERAPY DIAG:  Other abnormalities of gait and mobility  Unsteadiness on feet  Muscle weakness (generalized)  Abnormal posture  RATIONALE FOR EVALUATION AND TREATMENT: Rehabilitation  ONSET DATE: Initial PD diagnosis >15 yrs ago, worsening symptoms over past few months  NEXT MD VISIT: 08/24/2023  SUBJECTIVE:                                                                                                                                                                                                         SUBJECTIVE STATEMENT: Pt states leg is still bothering him but the exercises and stretches continue to help. Pain now 4-6/10 when it hurts.  EVAL:  Pt reports he feels that he needs to use the rollator more now than previously and notes more effort when tries to go without the walker. He would like to use a 4WW with bigger wheels -  he found one that needed fixing up and he has been using this, but would prefer a new one.  He feels like his legs are getting weaker and his activity tolerance is decreasing.  As of our PD screen on 02/21/23, He noted he was starting to feel the effects of the Parkinson's more - dizziness upon coming to standing and feeling weaker. Standing for ADLs cause him more fatigue than they used to, requiring seated rest breaks. He noted increased difficulty with UB dressing when trying to don a jacket. Walking requires more effort and he tires more easily. Feels more unsteady on his feet.   Pt accompanied by: self  PAIN: Are you having pain? Yes: NPRS scale: up to 5/10 in weightbearing when triggered  Pain location: L buttock with some radicular pain  Pain description: pain and weakness  Aggravating factors: weightbearing/walking  Relieving factors: ibuprofen, rest (sitting or lying down)   PERTINENT HISTORY:  PPM 2 sick sinus syndrome/SA node dysfunction & complete heart block, orthostatic hypotension, COPD, OSA, HTN, DM2, Parkinson's Disease, CVA 03/2021, hypothyroidism, PTSD, bipolar I disorder   PRECAUTIONS: Fall and ICD/Pacemaker  RED FLAGS: None  WEIGHT BEARING RESTRICTIONS: No  FALLS:  Has patient fallen in last 6 months? No  LIVING ENVIRONMENT: Lives with: lives alone and with his dog Lives in: Other Independent living facility (3rd floor) Stairs: Elevator Has following equipment at home: Single point cane, Environmental consultant - 4 wheeled, and hiking pole  OCCUPATION: Retired  PLOF: Independent and Leisure: being outdoors - walking his dog, and stays active most of the day  PATIENT GOALS: "To be better prepared for balance issues and be able to adjust to avoid falling."   OBJECTIVE: (objective measures completed at initial evaluation unless otherwise dated)  DIAGNOSTIC FINDINGS:  03/14/22 & 03/15/22 - CT Head w/o contrast:  IMPRESSION: 1. No evidence of acute intracranial abnormality. 2.  Chronic small vessel ischemic disease and cerebral atrophy.  03/14/22 -  CT Angio Head & Neck: IMPRESSION: 1. Negative CTA for large vessel occlusion or other emergent finding. 2. Mild for age atheromatous change about the carotid bifurcations and carotid siphons without hemodynamically significant stenosis.  COGNITION: Overall cognitive status: Within functional limits for tasks assessed   SENSATION: WFL Except neuropathy in his feet - most notable at night  COORDINATION: Gross motor WFL B UE/LE   POSTURE:  rounded shoulders, forward head, and flexed trunk   MUSCLE LENGTH: Hamstrings: mod tight B ITB: mild/mod tight B Piriformis: mild/mod tight B Hip flexors: mod/severe tight B Quads: mod tight B Heelcord: mod tight L, mild tight R   LOWER EXTREMITY ROM:    Grossly WFL with exceptions of limitation related to muscle tightness as above  LOWER EXTREMITY MMT:    MMT Right eval Left eval R 05/18/23 L 05/18/23  Hip flexion 4+ 4+ 5 5  Hip extension 4 ^ 4 ^ 4+ ^ 4+ ^  Hip abduction 4- 4- 4+ 4+  Hip adduction 4 4 4+ 4+  Hip internal rotation 4+ 4+ 5 5  Hip external rotation 4 4 4+ 4+  Knee flexion 5 5 5 5   Knee extension 4+ 5 5 5   Ankle dorsiflexion 5 4+ 5 5  Ankle plantarflexion      Ankle inversion      Ankle eversion      (Blank rows = not tested, ^ - limited ROM)  BED MOBILITY:  Sit to supine Modified independence Supine to sit Modified independence Rolling to Right Modified independence  TRANSFERS: Assistive device utilized: None  Sit to stand: Complete Independence Stand to sit: Complete Independence Chair to chair:   Floor:    GAIT: Distance walked: clinic distances Assistive device utilized: Single point cane Level of assistance: Modified independence Gait pattern: step through pattern, decreased arm swing- Right, decreased arm swing- Left, and trunk flexed Comments: Pt reports he has been using his 4WW/rollator more commonly, especially  outdoors  STAIRS:  Level of Assistance:    Psychologist, counselling Technique:   Number of Stairs:    Height of Stairs:   Comments:   FUNCTIONAL TESTS:  5 times sit to stand: 13.69 sec w/o UE assist  Timed up and go (TUG): 10.78 sec (Normal), 10.09 sec (Manual), 12.72 sec (Cognitive - unable to complete cognitive task w/o multiple errors) 10 meter walk test: 9.00 sec w/o AD; Gait speed = 3.64 ft/sec  Berg Balance Scale: 44/56; 37-45 significant (>80%) risk for falls (04/18/23)  Dynamic Gait Index: 17/30; < 19 = high risk fall (04/18/23)  04/18/2023  Berg = 44/56; scores of 37-45 indicate a significant (>80%) risk for falls FGA = 17/30; score of <19/30 indicate a high risk for falls Berg Balance Test  Sit to Stand Able to stand without using hands and stabilize independently   Standing Unsupported Able to stand safely 2 minutes   Sitting with Back Unsupported but Feet Supported on Floor or Stool Able to sit safely and securely 2 minutes   Stand to Sit Sits safely with minimal use of hands   Transfers Able to transfer safely, definite need of hands   Standing Unsupported with Eyes Closed Able to stand 10 seconds with supervision   Standing Unsupported with Feet Together Able to place feet together independently and stand for 1 minute with supervision   From Standing, Reach Forward with Outstretched Arm Can reach forward >12 cm safely (5")   From Standing Position, Pick up Object from Floor Able to pick up  shoe safely and easily   From Standing Position, Turn to Look Behind Over each Shoulder Looks behind one side only/other side shows less weight shift   Turn 360 Degrees Able to turn 360 degrees safely but slowly   Standing Unsupported, Alternately Place Feet on Step/Stool Able to stand independently and safely and complete 8 steps in 20 seconds   Standing Unsupported, One Foot in Front Able to take small step independently and hold 30 seconds   Standing on One Leg Tries to lift leg/unable to hold  3 seconds but remains standing independently   Total Score 44   Berg comment: 37-45 significant risk for falls(>80%)   Functional Gait  Assessment  Gait Level Surface Walks 20 ft in less than 7 sec but greater than 5.5 sec, uses assistive device, slower speed, mild gait deviations, or deviates 6-10 in outside of the 12 in walkway width.   Change in Gait Speed Able to change speed, demonstrates mild gait deviations, deviates 6-10 in outside of the 12 in walkway width, or no gait deviations, unable to achieve a major change in velocity, or uses a change in velocity, or uses an assistive device.   Gait with Horizontal Head Turns Performs head turns smoothly with slight change in gait velocity (eg, minor disruption to smooth gait path), deviates 6-10 in outside 12 in walkway width, or uses an assistive device.   Gait with Vertical Head Turns Performs task with slight change in gait velocity (eg, minor disruption to smooth gait path), deviates 6 - 10 in outside 12 in walkway width or uses assistive device   Gait and Pivot Turn Pivot turns safely within 3 sec and stops quickly with no loss of balance.   Step Over Obstacle Is able to step over one shoe box (4.5 in total height) without changing gait speed. No evidence of imbalance.   Gait with Narrow Base of Support Ambulates less than 4 steps heel to toe or cannot perform without assistance.   Gait with Eyes Closed Walks 20 ft, slow speed, abnormal gait pattern, evidence for imbalance, deviates 10-15 in outside 12 in walkway width. Requires more than 9 sec to ambulate 20 ft.   Ambulating Backwards Walks 20 ft, slow speed, abnormal gait pattern, evidence for imbalance, deviates 10-15 in outside 12 in walkway width.   Steps Alternating feet, must use rail.   Total Score 17   FGA comment: < 19 = high risk fall    05/15/23: Berg = 44/56; scores of 46-51 indicate a moderate (>50%) risk for falls Berg Balance Test  Sit to Stand Able to stand without using  hands and stabilize independently   Standing Unsupported Able to stand safely 2 minutes   Sitting with Back Unsupported but Feet Supported on Floor or Stool Able to sit safely and securely 2 minutes   Stand to Sit Sits safely with minimal use of hands   Transfers Able to transfer safely, minor use of hands   Standing Unsupported with Eyes Closed Able to stand 10 seconds safely   Standing Unsupported with Feet Together Able to place feet together independently and stand for 1 minute with supervision   From Standing, Reach Forward with Outstretched Arm Can reach confidently >25 cm (10")   From Standing Position, Pick up Object from Floor Able to pick up shoe safely and easily   From Standing Position, Turn to Look Behind Over each Shoulder Looks behind one side only/other side shows less weight shift   Turn 360  Degrees Able to turn 360 degrees safely but slowly   Standing Unsupported, Alternately Place Feet on Step/Stool Able to stand independently and safely and complete 8 steps in 20 seconds   Standing Unsupported, One Foot in Front Able to take small step independently and hold 30 seconds   Standing on One Leg Able to lift leg independently and hold 5-10 seconds   Total Score 49   Berg comment: 46-51 moderate risk for falls (>50%)    Scores as of discharge from last PT episode - 09/07/2022: 5xSTS = 12.87 sec w/o UE assist TUG: Normal = 8.34 sec w/o AD; Manual = 9.16 sec; Cognitive = 14.06 sec : 6.44 sec w/o AD Gait speed: 5.09 ft/sec w/o AD Berg = 52/56; 52-55 lower fall risk (> 25%) FGA = 25/30; 25-28 = low risk fall   PATIENT SURVEYS:  ABC scale 980 / 1600 = 61.3 % (50-80% = moderate level of physical functioning); <69% indicates risk for recurrent falls in PD 05/22/23 - 1270 / 1600 = 79.4 %  Score as of discharge from last PT episode - 09/07/2022: ABC scale: 1170 / 1600 = 73.1 %    TODAY'S TREATMENT:   05/29/23 THERAPEUTIC EXERCISE: To improve strength, endurance, ROM, and  flexibility.  Demonstration, verbal and tactile cues throughout for technique.  NuStep - L5 x 6 minutes - UE/LE to promote reciprocal movement patterns Seated L hip hinge hamstring stretch x 30" Seated L adductor/hamstring stretch x 30" Supine L HS stretch with strap x 30" each in neutral and with hip ER & IR - best stretch with hip IR, therefore completed 2 additional reps x 30" Supine L ITB stretch with strap 3 x 30" Hooklying L KTOS piriformis stretch 2 x 30"  NEUROMUSCULAR RE-EDUCATION: To improve balance, coordination, kinesthesia, and proprioception. Standing alt hip ABD x 10 - UE support on rollator for balance Standing alt hip extension x 10 - UE support on rollator for balance Standing alt HS curls x 10 - UE support on rollator for balance Standing alt hip flexion + ER x 10 - UE support on rollator for balance   05/24/2023 THERAPEUTIC EXERCISE: Nustep L5 x 5 min UEs/LEs Standing lateral shift into wall x10 Hooklying piriformis stretch 2x 30" Supine ITB stretch with strap 2x30" Supine adductor/hamstring stretch with strap x60" Seated adductor/hamstring stretch x 30"  NEUROMUSCULAR RE-ED: Prone hip IR/ER to inhibit muscle activation x10 Seated hip IR/ER to inhibit muscle activation x10 Hip hinge 2x10 Backwards/fwds step and rock x10  MANUAL THERAPY STM & TPR Medial hamstring and adductor proximal insertion along ischial tuberosities   05/22/2023 - 10th visit PN THERAPEUTIC EXERCISE: To improve strength, endurance, and flexibility.  Demonstration, verbal and tactile cues throughout for technique. Rec Bike - L1 x 6 min Hooklying L KTOS piriformis stretch 2 x 30" Hooklying L figure-4 piriformis stretch with slight overpressure x 30" - preferred latter stretch Hooklying L figure-4 piriformis to chest stretch x 30" each with B LE and single LE lift - preferred single leg version with good stretch reported Seated L KTOS piriformis stretch x 30" - preferred hooklying  version Seated L hip hinge figure-4 piriformis stretch x 30" - preferred hooklying version  MANUAL THERAPY: To promote normalized muscle tension, improved flexibility, and reduced pain utilizing connective tissue massage, therapeutic massage, and manual TP therapy.  STM/DTM and manual TPR to L glutes and esp piriformis Provided instruction in self-STM techniques to L piriformis/glutes using tennis ball in sitting on firm chair or standing  against wall.   THERAPEUTIC ACTIVITIES: To improve functional performance.  Demonstration, verbal and tactile cues throughout for technique. ABC scale: 1270 / 1600 = 79.4 %   PATIENT EDUCATION:  Education details: HEP update - piriformis stretches and self-STM techniques to L piriformis using tennis ball  Person educated: Patient Education method: Explanation, Demonstration, Verbal cues, and Handouts Education comprehension: verbalized understanding, returned demonstration, verbal cues required, and needs further education  HOME EXERCISE PROGRAM: Access Code: [email or web address] URL: [email or web address] Date: 05/22/2023 Prepared by: Felecia Hopper  Exercises - Hip Flexor Stretch with Strap on Table  - 2 x daily - 7 x weekly - 3 reps - 30 sec hold - Supine Hamstring Stretch with Strap  - 2 x daily - 7 x weekly - 3 reps - 30 sec hold - Standing Lumbar Extension at Wall - Forearms  - 2 x daily - 7 x weekly - 3 reps - 30 sec hold - Doorway Pec Stretch at 90 Degrees Abduction  - 2 x daily - 7 x weekly - 3 reps - 30 sec hold - Supine Piriformis Stretch with Foot on Ground (Mirrored)  - 2 x daily - 7 x weekly - 3 reps - 30 sec hold - Supine Figure 4 Piriformis Stretch with Leg Extension  - 2 x daily - 7 x weekly - 3 reps - 30 sec hold - Seated Piriformis Release With [employer] (Mirrored)  - 1-2 x daily - 7 x weekly - 1-2 min hold  PWR! Moves: Seated Standing   ASSESSMENT:  CLINICAL IMPRESSION: Adham Borrego" continues to report pain at L  ischial tuberosity although intensity has been decreasing.  He reports stretches seem to be most helpful with HS + hip IR and ITB stretches best targeting painful area.  He declined need for manual therapy, therefore worked on gently reintroducing normal hip motions in standing to promote increased weightbearing tolerance.  Patient reporting decreased pain and improved ambulation tolerance by end of session.  Raenette Bumps will benefit from continued skilled PT to address ongoing pain, strength, posture and motor control deficits to improve mobility and activity tolerance with decreased risk for falls.   OBJECTIVE IMPAIRMENTS: Abnormal gait, decreased activity tolerance, decreased balance, decreased coordination, decreased endurance, decreased knowledge of use of DME, decreased mobility, difficulty walking, decreased ROM, decreased strength, decreased safety awareness, dizziness, impaired perceived functional ability, impaired flexibility, improper body mechanics, postural dysfunction, and pain.   ACTIVITY LIMITATIONS: carrying, lifting, standing, squatting, stairs, transfers, bed mobility, reach over head, locomotion level, and caring for others   PARTICIPATION LIMITATIONS: meal prep, cleaning, laundry, driving, shopping, and community activity   PERSONAL FACTORS: Age, Past/current experiences, Time since onset of injury/illness/exacerbation, and 3+ comorbidities: PPM 2 sick sinus syndrome/SA node dysfunction & complete heart block, orthostatic hypotension, COPD, OSA, HTN, DM2, Parkinson's Disease, CVA 03/2021, hypothyroidism, PTSD, bipolar I disorder   are also affecting patient's functional outcome.    REHAB POTENTIAL: Good   CLINICAL DECISION MAKING: Evolving/moderate complexity   EVALUATION COMPLEXITY: Moderate   GOALS: Goals reviewed with patient? Yes  SHORT TERM GOALS: Target date: 05/18/2023   Complete remaining standardized balance assessment and update LTG's accordingly Baseline: Berg and FGA  TBA Goal status: MET - 04/18/23  Assess need for new rollator/4WW and provide recommendations to MD for DME prescription.  Baseline:  Goal status: MET - 05/22/23 - Pt reports he is pleased with his recent 4WW customization and does not feel need to order a new 4WW/rollator.  PT assessed modification and walker  appears safe and sturdy.  Patient will be independent with initial HEP Baseline:  Goal status: MET - 05/10/23  Patient will demonstrate improved B proximal LE strength to >/= 4+/5 for improved stability and ease of mobility. Baseline: Refer to above LE MMT table Goal status: MET - 05/18/23  LONG TERM GOALS: Target date: 06/29/2023  Patient will be independent with ongoing/advanced HEP for self-management at home incorporating PWR! Moves as indicated.  Baseline:  Goal status: PARTIALLY MET - 05/22/23 - Met for current HEP  2.  Patient will be able to ambulate at least 800 ft with LRAD including outdoor and unlevel surfaces, independently with no LOB, for improved community gait.   Baseline:  Goal status: IN PROGRESS - 05/22/23 - deferred assessment today due to acute L sciatica  3.  Patient will be able to step up/down curb safely with LRAD for safety with community ambulation.  Baseline:  Goal status: IN PROGRESS - 05/22/23 - deferred assessment today due to acute L sciatica  4.  Patient will demonstrate at least 4 point improvement on FGA to improve gait stability and reduce risk for falls.  Baseline: 17/30 (04/18/23) Goal status: IN PROGRESS - 05/22/23 - deferred assessment today due to acute L sciatica  5.  Patient will improve Berg score by at least 8 points to improve safety and stability with ADLs in standing and reduce risk for falls.    Baseline: 44/56 (04/18/23) Goal status: IN PROGRESS - 05/15/23 - 49/56  6.  Patient will report >/= 70% on ABC scale to demonstrate improved balance confidence and decreased risk for falls. Baseline: 980 / 1600 = 61.3 %  Goal status: MET - 05/22/23 -  1270 / 1600 = 79.4 %  7. Patient will verbalize understanding of local Parkinson's disease community resources, including community fitness post d/c. Baseline:  Goal status: IN PROGRESS   PLAN:  PT FREQUENCY: 2x/week  PT DURATION: 8 weeks  PLANNED INTERVENTIONS: 97164- PT Re-evaluation, 97110-Therapeutic exercises, 97530- Therapeutic activity, 97112- Neuromuscular re-education, 97535- Self Care, 78295- Manual therapy, [phone]- Gait training, [phone]- Electrical stimulation (unattended), 97035- Ultrasound, [id number]- Ionotophoresis 4mg /ml Dexamethasone, Patient/Family education, Balance training, Stair training, Taping, Dry Needling, Joint mobilization, DME instructions, Cryotherapy, Moist heat, and 97750- Physical Performance Test or Measurement  PLAN FOR NEXT SESSION: FGA reassessment as pain resolves, assess gait outdoors weather permitting; progress postural and proximal LE flexibility and core/proximal LE strengthening - update/modify HEP as indicated; progress PWR! Moves   [place]a [place], [place] 05/29/2023, 2:47 PM

## 2023-05-30 ENCOUNTER — Ambulatory Visit (INDEPENDENT_AMBULATORY_CARE_PROVIDER_SITE_OTHER): Admitting: *Deleted

## 2023-05-30 VITALS — BP 123/62 | HR 61 | Temp 97.7°F | Resp 18 | Ht 75.0 in | Wt 194.0 lb

## 2023-05-30 DIAGNOSIS — Z0000 Encounter for general adult medical examination without abnormal findings: Secondary | ICD-10-CM | POA: Diagnosis not present

## 2023-05-30 NOTE — Progress Notes (Signed)
 Subjective:   Victor Osborne is a 89 y.o. who presents for a Medicare Wellness preventive visit.  As a reminder, Annual Wellness Visits don't include a physical exam, and some assessments may be limited, especially if this visit is performed virtually. We may recommend an in-person visit if needed.  Visit Complete: In person   Persons Participating in Visit: Patient.  AWV Questionnaire: No: Patient Medicare AWV questionnaire was not completed prior to this visit.  Cardiac Risk Factors include: advanced age (>30men, >64 women);hypertension;dyslipidemia     Objective:     Today's Vitals   05/30/23 1053 05/30/23 1108  BP: 123/62   Pulse: 61   Resp: 18   Temp: 97.7 F (36.5 C)   TempSrc: Oral   SpO2: 97%   Weight: 194 lb (88 kg)   Height: 6\' 3"  (1.905 m)   PainSc:  4    Body mass index is 24.25 kg/m.     05/30/2023   11:32 AM 04/06/2023   11:53 AM 03/03/2023    2:10 PM 09/28/2022    6:42 AM 06/07/2022    1:15 PM 05/23/2022    1:44 PM 03/14/2022    8:24 PM  Advanced Directives  Does Patient Have a Medical Advance Directive? Yes Yes Yes Yes Yes Yes Yes  Type of Advance Directive Living will Healthcare Power of [place];Living will Living will Healthcare Power of [place];Living will Healthcare Power of [place];Living will Healthcare Power of [place];Living will Healthcare Power of [place];Living will  Does patient want to make changes to medical advance directive? No - Patient declined No - Patient declined  No - Patient declined  No - Patient declined No - Patient declined  Copy of Healthcare Power of Attorney in Chart?  No - copy requested  No - copy requested  No - copy requested No - copy requested    Current Medications (verified) Outpatient Encounter Medications as of 05/30/2023  Medication Sig   acetaminophen  (TYLENOL ) 325 MG tablet Take 650 mg by mouth every 6 (six) hours as needed for mild pain or headache.   alclomethasone (ACLOVATE) 0.05 % ointment Apply  1 application topically 2 (two) times daily as needed (for rash).   carbamazepine  (TEGRETOL ) 200 MG tablet Take 1 tablet (200 mg total) by mouth 3 (three) times daily.   carbidopa -levodopa  (SINEMET  IR) 25-100 MG tablet TAKE 2 TABLETS BY MOUTH THREE TIMES DAILY   cholecalciferol  (VITAMIN D) 1000 UNITS tablet Take 2,000 Units by mouth daily.   clopidogrel  (PLAVIX ) 75 MG tablet Take 1 tablet (75 mg total) by mouth daily.   Coenzyme Q10 (CO Q 10 PO) Take 1 capsule by mouth daily.   ferrous sulfate  324 (65 Fe) MG TBEC Take 324 mg by mouth daily.   Flaxseed, Linseed, (FLAX SEED OIL) 1300 MG CAPS Take 1 capsule by mouth daily.   FLUoxetine  (PROZAC ) 10 MG capsule Take 1 capsule (10 mg total) by mouth daily.   Fluticasone -Umeclidin-Vilant (TRELEGY ELLIPTA ) 100-62.5-25 MCG/ACT AEPB Inhale 1 puff into the lungs daily.   levothyroxine  (SYNTHROID ) 125 MCG tablet TAKE 1 TABLET(125 MCG) BY MOUTH DAILY BEFORE BREAKFAST   MAGNESIUM PO Take 1 capsule by mouth daily.   Multiple Vitamin (MULITIVITAMIN WITH MINERALS) TABS Take 1 tablet by mouth daily.   Omega-3 Fatty Acids (OMEGA-3 CF PO) Take 1,200 mg by mouth daily.   POTASSIUM GLUCONATE PO Take 1 capsule by mouth daily.   pravastatin  (PRAVACHOL ) 80 MG tablet Take 1 tablet (80 mg total) by mouth daily.   QUEtiapine  (  SEROQUEL  XR) 50 MG TB24 24 hr tablet Take 1 tablet (50 mg total) by mouth at bedtime.   QUEtiapine  (SEROQUEL ) 25 MG tablet Take 1 tablet (25 mg total) by mouth at bedtime. Take with Seroquel  XR 50 mg.   tamsulosin  (FLOMAX ) 0.4 MG CAPS capsule Take 0.4 mg by mouth at bedtime.   No facility-administered encounter medications on file as of 05/30/2023.    Allergies (verified) Patient has no known allergies.   History: Past Medical History:  Diagnosis Date   Bipolar 1 disorder (HCC)    COPD (chronic obstructive pulmonary disease) (HCC)    Depression    First degree AV block    Hypertension    pt denies but was in medical record   Hypoglycemia,  unspecified    Myalgia and myositis, unspecified    Obesity    Pneumonia 1970   Skin cancer (melanoma) (HCC)    Thyroid  disease    Unspecified hypothyroidism    UPJ obstruction, congenital    Past Surgical History:  Procedure Laterality Date   APPENDECTOMY  1939   malignant melanoma removed from right forehead  2008   PERMANENT PACEMAKER INSERTION N/A 06/19/2013   Procedure: PERMANENT PACEMAKER INSERTION;  Surgeon: Tammie Fall, MD;  Location: [facility] CATH LAB;  Service: Cardiovascular;  Laterality: N/A;   PPM GENERATOR CHANGEOUT N/A 09/28/2022   Procedure: PPM GENERATOR CHANGEOUT;  Surgeon: Tammie Fall, MD;  Location: [facility] INVASIVE CV LAB;  Service: Cardiovascular;  Laterality: N/A;   right big toe  surgery  2009   TRANSURETHRAL RESECTION OF PROSTATE  02/04/2011   Procedure: TRANSURETHRAL RESECTION OF THE PROSTATE (TURP);  Surgeon: Kristeen Peto, MD;  Location: [facility] ORS;  Service: Urology;  Laterality: N/A;   URETHRA SURGERY  2010   reconstruction   Family History  Problem Relation Age of Onset   Cerebral aneurysm Mother    Lung cancer Father    Drug abuse Daughter    Bipolar disorder Daughter    Social History   Socioeconomic History   Marital status: Widowed    Spouse name: Not on file   Number of children: Not on file   Years of education: Not on file   Highest education level: Not on file  Occupational History   Occupation: retired  Tobacco Use   Smoking status: Former    Current packs/day: 0.00    Average packs/day: 2.0 packs/day for 50.0 years (100.0 ttl pk-yrs)    Types: Cigarettes    Start date: 89    Quit date: 38    Years since quitting: 30.3   Smokeless tobacco: Never  Vaping Use   Vaping status: Never Used  Substance and Sexual Activity   Alcohol use: No   Drug use: No   Sexual activity: Not Currently  Other Topics Concern   Not on file  Social History Narrative   Work or School: retire Emergency planning/management officer      Home Situation: lives with wife       Spiritual Beliefs: episcopalian      Lifestyle: active with yard work; diet ok   Social Drivers of Corporate investment banker Strain: Low Risk  (05/30/2023)   Overall Financial Resource Strain (CARDIA)    Difficulty of Paying Living Expenses: Not very hard  Food Insecurity: No Food Insecurity (05/30/2023)   Hunger Vital Sign    Worried About Running Out of Food in the Last Year: Never true    Ran Out of Food in the Last Year:  Never true  Transportation Needs: No Transportation Needs (05/30/2023)   PRAPARE - Administrator, Civil Service (Medical): No    Lack of Transportation (Non-Medical): No  Physical Activity: Insufficiently Active (05/30/2023)   Exercise Vital Sign    Days of Exercise per Week: 5 days    Minutes of Exercise per Session: 20 min  Stress: No Stress Concern Present (05/30/2023)   [employer] of Occupational Health - Occupational Stress Questionnaire    Feeling of Stress : Not at all  Social Connections: Moderately Isolated (05/30/2023)   Social Connection and Isolation Panel [NHANES]    Frequency of Communication with Friends and Family: More than three times a week    Frequency of Social Gatherings with Friends and Family: Once a week    Attends Religious Services: More than 4 times per year    Active Member of [employer] or Organizations: No    Attends Banker Meetings: Never    Marital Status: Widowed    Tobacco Counseling Counseling given: Not Answered    Clinical Intake:  Pre-visit preparation completed: Yes  Pain : 0-10 Pain Score: 4  (has flare up of sciatic nerve and pain is a 4 when walking. Currently seeing PT) Pain Type: Neuropathic pain Pain Location: Buttocks Pain Orientation: Left, Distal Pain Descriptors / Indicators: Radiating Pain Onset: 1 to 4 weeks ago (happened last Friday) Pain Relieving Factors: going to PT  Pain Relieving Factors: going to PT  BMI - recorded: 24.25 Nutritional Status: BMI of 19-24   Normal Nutritional Risks: None Diabetes: No  Lab Results  Component Value Date   HGBA1C 5.9 (H) 03/15/2022   HGBA1C 5.5 03/31/2021   HGBA1C 5.1 01/16/2019     What is the last grade level you completed in school?: bachelor's degree Police Management  Interpreter Needed?: No  Information entered by :: Susa Engman, CMA   Activities of Daily Living     05/30/2023   11:13 AM  In your present state of health, do you have any difficulty performing the following activities:  Hearing? 1  Comment wears bilateral hearing aids  Vision? 1  Comment 1 1/2 yr ago,  will schedule soon with Dr Joanne Muckle  Difficulty concentrating or making decisions? 1  Comment name recall mostly,  Walking or climbing stairs? 0  Comment uses hand rails  Dressing or bathing? 0  Doing errands, shopping? 0  Preparing Food and eating ? N  Using the Toilet? N  In the past six months, have you accidently leaked urine? Y  Comment "leaks urine some" sees urologist  Do you have problems with loss of bowel control? N  Managing your Medications? N  Managing your Finances? N  Housekeeping or managing your Housekeeping? N    Patient Care Team: Adra Alanis, FNP as PCP - General (Internal Medicine) Tammie Fall, MD as PCP - Electrophysiology (Cardiology) Tat, Von Grumbling, DO as Consulting Physician (Neurology) Pauline Bos, OD as Referring Physician (Optometry)  Indicate any recent Medical Services you may have received from other than [facility] providers in the past year (date may be approximate).     Assessment:    This is a routine wellness examination for Victor Osborne.  Hearing/Vision screen Hearing Screening - Comments:: Bilateral hearing aids Vision Screening - Comments:: ! Year ago with Dr Joanne Muckle. Pt will schedule   Goals Addressed             This Visit's Progress    Exercise 3x per week (30  min per time)   On track    Continue to exercise and stay healthy.       Depression Screen      05/30/2023   11:20 AM 05/23/2022    1:46 PM 11/16/2021    2:29 PM 11/04/2021   12:58 PM 05/14/2021    2:08 PM 05/07/2021   11:23 AM 04/12/2021    5:52 PM  PHQ 2/9 Scores  PHQ - 2 Score 0 0 0 0 0 0 0  PHQ- 9 Score 0          Fall Risk     05/30/2023   11:34 AM 03/03/2023    2:10 PM 05/23/2022    1:45 PM 04/07/2022    8:38 AM 11/16/2021    2:29 PM  Fall Risk   Falls in the past year? 0 0 0 0 0  Number falls in past yr: 0 0 0 0 0  Injury with Fall? 0 0 0 0 0  Comment     N/A- no falls reported x > 1 year  Risk for fall due to : Impaired balance/gait;History of fall(s)  Impaired balance/gait  History of fall(s);Impaired mobility;Other (Comment)  Risk for fall due to: Comment Uses walker    uses assistive devices  Follow up Falls prevention discussed Falls evaluation completed Falls evaluation completed Falls evaluation completed Falls prevention discussed    MEDICARE RISK AT HOME:  Medicare Risk at Home Any stairs in or around the home?: Yes If so, are there any without handrails?: No Home free of loose throw rugs in walkways, pet beds, electrical cords, etc?: Yes Adequate lighting in your home to reduce risk of falls?: Yes Life alert?: No Use of a cane, walker or w/c?: Yes (walker) Grab bars in the bathroom?: Yes Shower chair or bench in shower?: Yes Elevated toilet seat or a handicapped toilet?: No  TIMED UP AND GO:  Was the test performed?  Yes  Length of time to ambulate 10 feet: 6 sec Gait steady and fast with assistive device  Cognitive Function: 6CIT completed      03/22/2016   12:54 PM  Montreal Cognitive Assessment   Visuospatial/ Executive (0/5) 1  Naming (0/3) 3  Attention: Read list of digits (0/2) 2  Attention: Read list of letters (0/1) 1  Attention: Serial 7 subtraction starting at 100 (0/3) 1  Language: Repeat phrase (0/2) 2  Language : Fluency (0/1) 1  Abstraction (0/2) 2  Delayed Recall (0/5) 4  Orientation (0/6) 5  Total 22  Adjusted Score  (based on education) 22      05/30/2023   11:35 AM 05/23/2022    1:51 PM 05/14/2021    2:14 PM 09/08/2016    3:41 PM  6CIT Screen  What Year? 0 points 0 points 0 points 0 points  What month? 0 points 0 points 0 points 0 points  What time? 0 points 0 points 0 points 0 points  Count back from 20 0 points 0 points 0 points 0 points  Months in reverse 0 points 0 points 2 points 0 points  Repeat phrase 2 points 0 points 0 points 0 points  Total Score 2 points 0 points 2 points 0 points    Immunizations Immunization History  Administered Date(s) Administered   Fluad Quad(high Dose 65+) 07/19/2018, 09/28/2020, 10/20/2021   Influenza Split 10/18/2010, 10/18/2011, 10/17/2012, 10/04/2013   Influenza, High Dose Seasonal PF 10/01/2013, 10/01/2015, 09/08/2016   Influenza,inj,Quad PF,6+ Mos 10/18/2014   Influenza-Unspecified 10/18/2014  PFIZER Comirnaty(Gray Top)Covid-19 Tri-Sucrose Vaccine 08/09/2020   PFIZER(Purple Top)SARS-COV-2 Vaccination 02/06/2019, 02/27/2019, 10/24/2019   PPD Test 10/11/2016   Pfizer Covid-19 Vaccine Bivalent Booster 32yrs & up 11/18/2020   Pfizer(Comirnaty)Fall Seasonal Vaccine 12 years and older 10/29/2021   Pneumococcal Conjugate-13 09/08/2016   Pneumococcal Polysaccharide-23 10/28/2011   Respiratory Syncytial Virus Vaccine,Recomb Aduvanted(Arexvy) 10/29/2021   Tdap 09/09/2010, 06/18/2011   Zoster, Live 01/02/2020    Screening Tests Health Maintenance  Topic Date Due   FOOT EXAM  Never done   OPHTHALMOLOGY EXAM  Never done   Zoster Vaccines- Shingrix (1 of 2) 11/04/1951   DTaP/Tdap/Td (3 - Td or Tdap) 06/17/2021   HEMOGLOBIN A1C  09/13/2022   COVID-19 Vaccine (7 - 2024-25 season) 05/29/2024 (Originally 09/18/2022)   INFLUENZA VACCINE  08/18/2023   Medicare Annual Wellness (AWV)  05/29/2024   Pneumonia Vaccine 59+ Years old  Completed   HPV VACCINES  Aged Out   Meningococcal B Vaccine  Aged Out    Health Maintenance  Health Maintenance Due  Topic Date  Due   FOOT EXAM  Never done   OPHTHALMOLOGY EXAM  Never done   Zoster Vaccines- Shingrix (1 of 2) 11/04/1951   DTaP/Tdap/Td (3 - Td or Tdap) 06/17/2021   HEMOGLOBIN A1C  09/13/2022   Health Maintenance Items Addressed: Pt will check with pharmacy about getting shingles and tetanus vaccines. Eye exam requested from Dr Joanne Muckle.  Additional Screening:  Vision Screening: Recommended annual ophthalmology exams for early detection of glaucoma and other disorders of the eye.  Dental Screening: Recommended annual dental exams for proper oral hygiene  Community Resource Referral / Chronic Care Management: CRR required this visit?  No  CCM required this visit?  No   Plan:    I have personally reviewed and noted the following in the patient's chart:   Medical and social history Use of alcohol, tobacco or illicit drugs  Current medications and supplements including opioid prescriptions. Patient is not currently taking opioid prescriptions. Functional ability and status Nutritional status Physical activity Advanced directives List of other physicians Hospitalizations, surgeries, and ER visits in previous 12 months Vitals Screenings to include cognitive, depression, and falls Referrals and appointments  In addition, I have reviewed and discussed with patient certain preventive protocols, quality metrics, and best practice recommendations. A written personalized care plan for preventive services as well as general preventive health recommendations were provided to patient.   Susa Engman, CMA   05/30/2023   After Visit Summary: (In Person-Printed) AVS printed and given to the patient  Notes: Nothing significant to report at this time.

## 2023-05-30 NOTE — Patient Instructions (Addendum)
 Check with your pharmacy about getting shingles and tetanus vaccines.

## 2023-05-31 ENCOUNTER — Telehealth: Payer: Self-pay | Admitting: *Deleted

## 2023-05-31 NOTE — Telephone Encounter (Signed)
 Pt was in the office yesterday for medicare wellness visit with  me.  He mentioned that he read Pravastatin  may cause double vision.  Pt states he has been having double vision and feels it worsened since his dose was increased. He wanted to talk with you about changing medication or lowering dose before he goes back to his eye doctor.  Please advise?

## 2023-06-01 ENCOUNTER — Ambulatory Visit: Admitting: Physical Therapy

## 2023-06-01 ENCOUNTER — Encounter: Payer: Self-pay | Admitting: Physical Therapy

## 2023-06-01 DIAGNOSIS — R2689 Other abnormalities of gait and mobility: Secondary | ICD-10-CM | POA: Diagnosis not present

## 2023-06-01 DIAGNOSIS — M6281 Muscle weakness (generalized): Secondary | ICD-10-CM

## 2023-06-01 DIAGNOSIS — R293 Abnormal posture: Secondary | ICD-10-CM

## 2023-06-01 DIAGNOSIS — R2681 Unsteadiness on feet: Secondary | ICD-10-CM

## 2023-06-01 NOTE — Therapy (Signed)
 OUTPATIENT PHYSICAL THERAPY TREATMENT    Patient Name: Victor Osborne MRN: [id number] DOB:January 24, 1932, 89 y.o., male Today's Date: 06/01/2023   END OF SESSION:  PT End of Session - 06/01/23 1347     Visit Number 13    Date for PT Re-Evaluation 06/29/23    Authorization Type Medicare & [employer]    Progress Note Due on Visit 20    PT Start Time 1347    PT Stop Time 1441    PT Time Calculation (min) 54 min    Activity Tolerance Patient tolerated treatment well    Behavior During Therapy WFL for tasks assessed/performed                Past Medical History:  Diagnosis Date   Bipolar 1 disorder (HCC)    COPD (chronic obstructive pulmonary disease) (HCC)    Depression    First degree AV block    Hypertension    pt denies but was in medical record   Hypoglycemia, unspecified    Myalgia and myositis, unspecified    Obesity    Pneumonia 1970   Skin cancer (melanoma) (HCC)    Thyroid  disease    Unspecified hypothyroidism    UPJ obstruction, congenital    Past Surgical History:  Procedure Laterality Date   APPENDECTOMY  1939   malignant melanoma removed from right forehead  2008   PERMANENT PACEMAKER INSERTION N/A 06/19/2013   Procedure: PERMANENT PACEMAKER INSERTION;  Surgeon: Tammie Fall, MD;  Location: [facility] CATH LAB;  Service: Cardiovascular;  Laterality: N/A;   PPM GENERATOR CHANGEOUT N/A 09/28/2022   Procedure: PPM GENERATOR CHANGEOUT;  Surgeon: Tammie Fall, MD;  Location: [facility] INVASIVE CV LAB;  Service: Cardiovascular;  Laterality: N/A;   right big toe  surgery  2009   TRANSURETHRAL RESECTION OF PROSTATE  02/04/2011   Procedure: TRANSURETHRAL RESECTION OF THE PROSTATE (TURP);  Surgeon: Kristeen Peto, MD;  Location: [facility] ORS;  Service: Urology;  Laterality: N/A;   URETHRA SURGERY  2010   reconstruction   Patient Active Problem List   Diagnosis Date Noted   Hyperlipidemia 03/15/2022   TIA (transient ischemic attack) 03/14/2022   History of neoplasm  04/12/2021   Melanocytic nevi of trunk 04/12/2021   Other seborrheic keratosis 04/12/2021   Cerebral embolism with cerebral infarction 03/31/2021   Left leg weakness 03/30/2021   Chronic post-traumatic stress disorder 12/05/2017   Bipolar I disorder, current or most recent episode depressed, in partial remission (HCC) 12/05/2017   CHB (complete heart block) (HCC) 01/27/2017   HTN (hypertension) 01/27/2017   Hypothyroidism 10/11/2016   Parkinson disease (HCC) 09/08/2016   Chronic osteoarthritis 03/20/2015   Bipolar 1 disorder (HCC) 03/20/2015   Tremor 03/20/2015   Pacemaker 09/24/2013   SSS (sick sinus syndrome) (HCC) 06/19/2013   First degree AV block 04/09/2013   Allergic rhinitis 03/17/2013   Obstructive sleep apnea 12/18/2006   COPD mixed type (HCC) 12/18/2006   History of malignant melanoma of skin 12/18/2006   Diabetes mellitus (HCC) 12/18/2006    PCP: Adra Alanis, FNP   REFERRING PROVIDER: Shirline Dover, DO   REFERRING DIAG: G20.A1 (ICD-10-CM) - Parkinson's disease without dyskinesia or fluctuating manifestations (HCC)   THERAPY DIAG:  Other abnormalities of gait and mobility  Unsteadiness on feet  Muscle weakness (generalized)  Abnormal posture  RATIONALE FOR EVALUATION AND TREATMENT: Rehabilitation  ONSET DATE: Initial PD diagnosis >15 yrs ago, worsening symptoms over past few months  NEXT MD VISIT: 08/24/2023  SUBJECTIVE:                                                                                                                                                                                                         SUBJECTIVE STATEMENT: Pt reports the L sciatica has improved but now he is noting some discomfort in the R proximal adductors.  EVAL:  Pt reports he feels that he needs to use the rollator more now than previously and notes more effort when tries to go without the walker. He would like to use a 4WW with bigger wheels - he found one  that needed fixing up and he has been using this, but would prefer a new one.  He feels like his legs are getting weaker and his activity tolerance is decreasing.  As of our PD screen on 02/21/23, He noted he was starting to feel the effects of the Parkinson's more - dizziness upon coming to standing and feeling weaker. Standing for ADLs cause him more fatigue than they used to, requiring seated rest breaks. He noted increased difficulty with UB dressing when trying to don a jacket. Walking requires more effort and he tires more easily. Feels more unsteady on his feet.   Pt accompanied by: self  PAIN: Are you having pain? Yes: NPRS scale: up to 4-5/10 in weightbearing when triggered  Pain location: L buttock with some radicular pain  Pain description: pain and weakness  Aggravating factors: weightbearing/walking  Relieving factors: ibuprofen, rest (sitting or lying down)   Are you having pain? Yes: NPRS scale: up to 5-6/10 when triggered Pain location: R proximal hip adductors  Pain description: ache  Aggravating factors: reaching cross body/pivoting in standing  Relieving factors: stretches/exercises   PERTINENT HISTORY:  PPM 2 sick sinus syndrome/SA node dysfunction & complete heart block, orthostatic hypotension, COPD, OSA, HTN, DM2, Parkinson's Disease, CVA 03/2021, hypothyroidism, PTSD, bipolar I disorder   PRECAUTIONS: Fall and ICD/Pacemaker  RED FLAGS: None  WEIGHT BEARING RESTRICTIONS: No  FALLS:  Has patient fallen in last 6 months? No  LIVING ENVIRONMENT: Lives with: lives alone and with his dog Lives in: Other Independent living facility (3rd floor) Stairs: Elevator Has following equipment at home: Single point cane, Environmental consultant - 4 wheeled, and hiking pole  OCCUPATION: Retired  PLOF: Independent and Leisure: being outdoors - walking his dog, and stays active most of the day  PATIENT GOALS: "To be better prepared for balance issues and be able to adjust to avoid  falling."   OBJECTIVE: (objective measures completed at initial evaluation unless otherwise  dated)  DIAGNOSTIC FINDINGS:  03/14/22 & 03/15/22 - CT Head w/o contrast:  IMPRESSION: 1. No evidence of acute intracranial abnormality. 2. Chronic small vessel ischemic disease and cerebral atrophy.  03/14/22 - CT Angio Head & Neck: IMPRESSION: 1. Negative CTA for large vessel occlusion or other emergent finding. 2. Mild for age atheromatous change about the carotid bifurcations and carotid siphons without hemodynamically significant stenosis.  COGNITION: Overall cognitive status: Within functional limits for tasks assessed   SENSATION: WFL Except neuropathy in his feet - most notable at night  COORDINATION: Gross motor WFL B UE/LE   POSTURE:  rounded shoulders, forward head, and flexed trunk   MUSCLE LENGTH: Hamstrings: mod tight B ITB: mild/mod tight B Piriformis: mild/mod tight B Hip flexors: mod/severe tight B Quads: mod tight B Heelcord: mod tight L, mild tight R   LOWER EXTREMITY ROM:    Grossly WFL with exceptions of limitation related to muscle tightness as above  LOWER EXTREMITY MMT:    MMT Right eval Left eval R 05/18/23 L 05/18/23  Hip flexion 4+ 4+ 5 5  Hip extension 4 ^ 4 ^ 4+ ^ 4+ ^  Hip abduction 4- 4- 4+ 4+  Hip adduction 4 4 4+ 4+  Hip internal rotation 4+ 4+ 5 5  Hip external rotation 4 4 4+ 4+  Knee flexion 5 5 5 5   Knee extension 4+ 5 5 5   Ankle dorsiflexion 5 4+ 5 5  Ankle plantarflexion      Ankle inversion      Ankle eversion      (Blank rows = not tested, ^ - limited ROM)  BED MOBILITY:  Sit to supine Modified independence Supine to sit Modified independence Rolling to Right Modified independence  TRANSFERS: Assistive device utilized: None  Sit to stand: Complete Independence Stand to sit: Complete Independence Chair to chair:   Floor:    GAIT: Distance walked: clinic distances Assistive device utilized: Single point cane Level of  assistance: Modified independence Gait pattern: step through pattern, decreased arm swing- Right, decreased arm swing- Left, and trunk flexed Comments: Pt reports he has been using his 4WW/rollator more commonly, especially outdoors  STAIRS:  Level of Assistance:    Psychologist, counselling Technique:   Number of Stairs:    Height of Stairs:   Comments:   FUNCTIONAL TESTS:  5 times sit to stand: 13.69 sec w/o UE assist  Timed up and go (TUG): 10.78 sec (Normal), 10.09 sec (Manual), 12.72 sec (Cognitive - unable to complete cognitive task w/o multiple errors) 10 meter walk test: 9.00 sec w/o AD; Gait speed = 3.64 ft/sec  Berg Balance Scale: 44/56; 37-45 significant (>80%) risk for falls (04/18/23)  Dynamic Gait Index: 17/30; < 19 = high risk fall (04/18/23)  04/18/2023  Berg = 44/56; scores of 37-45 indicate a significant (>80%) risk for falls FGA = 17/30; score of <19/30 indicate a high risk for falls Berg Balance Test  Sit to Stand Able to stand without using hands and stabilize independently   Standing Unsupported Able to stand safely 2 minutes   Sitting with Back Unsupported but Feet Supported on Floor or Stool Able to sit safely and securely 2 minutes   Stand to Sit Sits safely with minimal use of hands   Transfers Able to transfer safely, definite need of hands   Standing Unsupported with Eyes Closed Able to stand 10 seconds with supervision   Standing Unsupported with Feet Together Able to place feet together independently and stand for  1 minute with supervision   From Standing, Reach Forward with Outstretched Arm Can reach forward >12 cm safely (5")   From Standing Position, Pick up Object from Floor Able to pick up shoe safely and easily   From Standing Position, Turn to Look Behind Over each Shoulder Looks behind one side only/other side shows less weight shift   Turn 360 Degrees Able to turn 360 degrees safely but slowly   Standing Unsupported, Alternately Place Feet on Step/Stool Able  to stand independently and safely and complete 8 steps in 20 seconds   Standing Unsupported, One Foot in Front Able to take small step independently and hold 30 seconds   Standing on One Leg Tries to lift leg/unable to hold 3 seconds but remains standing independently   Total Score 44   Berg comment: 37-45 significant risk for falls(>80%)   Functional Gait  Assessment  Gait Level Surface Walks 20 ft in less than 7 sec but greater than 5.5 sec, uses assistive device, slower speed, mild gait deviations, or deviates 6-10 in outside of the 12 in walkway width.   Change in Gait Speed Able to change speed, demonstrates mild gait deviations, deviates 6-10 in outside of the 12 in walkway width, or no gait deviations, unable to achieve a major change in velocity, or uses a change in velocity, or uses an assistive device.   Gait with Horizontal Head Turns Performs head turns smoothly with slight change in gait velocity (eg, minor disruption to smooth gait path), deviates 6-10 in outside 12 in walkway width, or uses an assistive device.   Gait with Vertical Head Turns Performs task with slight change in gait velocity (eg, minor disruption to smooth gait path), deviates 6 - 10 in outside 12 in walkway width or uses assistive device   Gait and Pivot Turn Pivot turns safely within 3 sec and stops quickly with no loss of balance.   Step Over Obstacle Is able to step over one shoe box (4.5 in total height) without changing gait speed. No evidence of imbalance.   Gait with Narrow Base of Support Ambulates less than 4 steps heel to toe or cannot perform without assistance.   Gait with Eyes Closed Walks 20 ft, slow speed, abnormal gait pattern, evidence for imbalance, deviates 10-15 in outside 12 in walkway width. Requires more than 9 sec to ambulate 20 ft.   Ambulating Backwards Walks 20 ft, slow speed, abnormal gait pattern, evidence for imbalance, deviates 10-15 in outside 12 in walkway width.   Steps Alternating  feet, must use rail.   Total Score 17   FGA comment: < 19 = high risk fall    05/15/23: Berg = 44/56; scores of 46-51 indicate a moderate (>50%) risk for falls Berg Balance Test  Sit to Stand Able to stand without using hands and stabilize independently   Standing Unsupported Able to stand safely 2 minutes   Sitting with Back Unsupported but Feet Supported on Floor or Stool Able to sit safely and securely 2 minutes   Stand to Sit Sits safely with minimal use of hands   Transfers Able to transfer safely, minor use of hands   Standing Unsupported with Eyes Closed Able to stand 10 seconds safely   Standing Unsupported with Feet Together Able to place feet together independently and stand for 1 minute with supervision   From Standing, Reach Forward with Outstretched Arm Can reach confidently >25 cm (10")   From Standing Position, Pick up Object from Floor  Able to pick up shoe safely and easily   From Standing Position, Turn to Look Behind Over each Shoulder Looks behind one side only/other side shows less weight shift   Turn 360 Degrees Able to turn 360 degrees safely but slowly   Standing Unsupported, Alternately Place Feet on Step/Stool Able to stand independently and safely and complete 8 steps in 20 seconds   Standing Unsupported, One Foot in Front Able to take small step independently and hold 30 seconds   Standing on One Leg Able to lift leg independently and hold 5-10 seconds   Total Score 49   Berg comment: 46-51 moderate risk for falls (>50%)    Scores as of discharge from last PT episode - 09/07/2022: 5xSTS = 12.87 sec w/o UE assist TUG: Normal = 8.34 sec w/o AD; Manual = 9.16 sec; Cognitive = 14.06 sec : 6.44 sec w/o AD Gait speed: 5.09 ft/sec w/o AD Berg = 52/56; 52-55 lower fall risk (> 25%) FGA = 25/30; 25-28 = low risk fall   PATIENT SURVEYS:  ABC scale 980 / 1600 = 61.3 % (50-80% = moderate level of physical functioning); <69% indicates risk for recurrent falls in  PD 05/22/23 - 1270 / 1600 = 79.4 %  Score as of discharge from last PT episode - 09/07/2022: ABC scale: 1170 / 1600 = 73.1 %    TODAY'S TREATMENT:   06/01/23  GAIT TRAINING: To normalize gait pattern and improve safety with LRAD on all surfaces.  800' with SPC on indoor and outdoor surfaces including inclines, grass and up/down curb Stairs: Level of Assistance: Modified independence Stair Negotiation Technique: Alternating Pattern Forwards with and w/o use of AD: cane with Single Rail on Left Number of Stairs: 2 x 14  Height of Stairs: 7"   MANUAL THERAPY: To promote normalized muscle tension, improved flexibility, improved joint mobility, increased ROM, and reduced pain utilizing connective tissue massage, therapeutic massage, manual TP therapy, and myofascial release. STM/DTM and manual TPR to R proximal hip adductors   THERAPEUTIC EXERCISE: To improve ROM and flexibility.  Demonstration, verbal and tactile cues throughout for technique.  Seated R hip adductor stretch x 30", x ~60" while using roller stick on proximal hip adductors Supine manual R hip adductor stretch x 30" Supine B hip adductor butterfly/frog leg stretch x 30" Supine manual R hip adductor stretch with strap 2 x 30" - 2nd rep with lateral thigh supported on blue bolster   05/29/23 THERAPEUTIC EXERCISE: To improve strength, endurance, ROM, and flexibility.  Demonstration, verbal and tactile cues throughout for technique.  NuStep - L5 x 6 minutes - UE/LE to promote reciprocal movement patterns Seated L hip hinge hamstring stretch x 30" Seated L adductor/hamstring stretch x 30" Supine L HS stretch with strap x 30" each in neutral and with hip ER & IR - best stretch with hip IR, therefore completed 2 additional reps x 30" Supine L ITB stretch with strap 3 x 30" Hooklying L KTOS piriformis stretch 2 x 30"  NEUROMUSCULAR RE-EDUCATION: To improve balance, coordination, kinesthesia, and proprioception. Standing alt hip ABD  x 10 - UE support on rollator for balance Standing alt hip extension x 10 - UE support on rollator for balance Standing alt HS curls x 10 - UE support on rollator for balance Standing alt hip flexion + ER x 10 - UE support on rollator for balance   05/24/2023 THERAPEUTIC EXERCISE: Nustep L5 x 5 min UEs/LEs Standing lateral shift into wall x10 Hooklying piriformis stretch  2x 30" Supine ITB stretch with strap 2x30" Supine adductor/hamstring stretch with strap x60" Seated adductor/hamstring stretch x 30"  NEUROMUSCULAR RE-ED: Prone hip IR/ER to inhibit muscle activation x10 Seated hip IR/ER to inhibit muscle activation x10 Hip hinge 2x10 Backwards/fwds step and rock x10  MANUAL THERAPY STM & TPR Medial hamstring and adductor proximal insertion along ischial tuberosities   05/22/2023 - 10th visit PN THERAPEUTIC EXERCISE: To improve strength, endurance, and flexibility.  Demonstration, verbal and tactile cues throughout for technique. Rec Bike - L1 x 6 min Hooklying L KTOS piriformis stretch 2 x 30" Hooklying L figure-4 piriformis stretch with slight overpressure x 30" - preferred latter stretch Hooklying L figure-4 piriformis to chest stretch x 30" each with B LE and single LE lift - preferred single leg version with good stretch reported Seated L KTOS piriformis stretch x 30" - preferred hooklying version Seated L hip hinge figure-4 piriformis stretch x 30" - preferred hooklying version  MANUAL THERAPY: To promote normalized muscle tension, improved flexibility, and reduced pain utilizing connective tissue massage, therapeutic massage, and manual TP therapy.  STM/DTM and manual TPR to L glutes and esp piriformis Provided instruction in self-STM techniques to L piriformis/glutes using tennis ball in sitting on firm chair or standing against wall.   THERAPEUTIC ACTIVITIES: To improve functional performance.  Demonstration, verbal and tactile cues throughout for technique. ABC scale:  1270 / 1600 = 79.4 %   PATIENT EDUCATION:  Education details: HEP update - piriformis stretches and self-STM techniques to L piriformis using tennis ball  Person educated: Patient Education method: Explanation, Demonstration, Verbal cues, and Handouts Education comprehension: verbalized understanding, returned demonstration, verbal cues required, and needs further education  HOME EXERCISE PROGRAM: Access Code: [email or web address] URL: [email or web address] Date: 05/22/2023 Prepared by: Felecia Hopper  Exercises - Hip Flexor Stretch with Strap on Table  - 2 x daily - 7 x weekly - 3 reps - 30 sec hold - Supine Hamstring Stretch with Strap  - 2 x daily - 7 x weekly - 3 reps - 30 sec hold - Standing Lumbar Extension at Wall - Forearms  - 2 x daily - 7 x weekly - 3 reps - 30 sec hold - Doorway Pec Stretch at 90 Degrees Abduction  - 2 x daily - 7 x weekly - 3 reps - 30 sec hold - Supine Piriformis Stretch with Foot on Ground (Mirrored)  - 2 x daily - 7 x weekly - 3 reps - 30 sec hold - Supine Figure 4 Piriformis Stretch with Leg Extension  - 2 x daily - 7 x weekly - 3 reps - 30 sec hold - Seated Piriformis Release With [employer] (Mirrored)  - 1-2 x daily - 7 x weekly - 1-2 min hold  PWR! Moves: Seated Standing   ASSESSMENT:  CLINICAL IMPRESSION: Safal Kaster" reports less pain at L ischial tuberosity/glutes but now is having pain in R proximal hip adductors, although able to ambulate with his cane rather than the 4WW today.  Assessed gait indoors and outdoors over variable surfaces with SPC including curb negotiation with no instability noted although increased flexed posture evident and patient requiring seated rest break in the lobby due to fatigue and increased pain prior to returning to PT clinic. Deferred FGA reassessment again today due to pain and remainder of visit focusing on addressing pain and abnormal muscle tension with patient reporting reduction in pain by end of visit.   Raenette Bumps will benefit from continued skilled PT to address ongoing  pain, strength, posture and motor control deficits to improve mobility and activity tolerance with decreased risk for falls.   OBJECTIVE IMPAIRMENTS: Abnormal gait, decreased activity tolerance, decreased balance, decreased coordination, decreased endurance, decreased knowledge of use of DME, decreased mobility, difficulty walking, decreased ROM, decreased strength, decreased safety awareness, dizziness, impaired perceived functional ability, impaired flexibility, improper body mechanics, postural dysfunction, and pain.   ACTIVITY LIMITATIONS: carrying, lifting, standing, squatting, stairs, transfers, bed mobility, reach over head, locomotion level, and caring for others   PARTICIPATION LIMITATIONS: meal prep, cleaning, laundry, driving, shopping, and community activity   PERSONAL FACTORS: Age, Past/current experiences, Time since onset of injury/illness/exacerbation, and 3+ comorbidities: PPM 2 sick sinus syndrome/SA node dysfunction & complete heart block, orthostatic hypotension, COPD, OSA, HTN, DM2, Parkinson's Disease, CVA 03/2021, hypothyroidism, PTSD, bipolar I disorder   are also affecting patient's functional outcome.    REHAB POTENTIAL: Good   CLINICAL DECISION MAKING: Evolving/moderate complexity   EVALUATION COMPLEXITY: Moderate   GOALS: Goals reviewed with patient? Yes  SHORT TERM GOALS: Target date: 05/18/2023   Complete remaining standardized balance assessment and update LTG's accordingly Baseline: Berg and FGA TBA Goal status: MET - 04/18/23  Assess need for new rollator/4WW and provide recommendations to MD for DME prescription.  Baseline:  Goal status: MET - 05/22/23 - Pt reports he is pleased with his recent 4WW customization and does not feel need to order a new 4WW/rollator.  PT assessed modification and walker appears safe and sturdy.  Patient will be independent with initial HEP Baseline:  Goal status:  MET - 05/10/23  Patient will demonstrate improved B proximal LE strength to >/= 4+/5 for improved stability and ease of mobility. Baseline: Refer to above LE MMT table Goal status: MET - 05/18/23  LONG TERM GOALS: Target date: 06/29/2023  Patient will be independent with ongoing/advanced HEP for self-management at home incorporating PWR! Moves as indicated.  Baseline:  Goal status: PARTIALLY MET - 05/22/23 - Met for current HEP  2.  Patient will be able to ambulate at least 800 ft with LRAD including outdoor and unlevel surfaces, independently with no LOB, for improved community gait.   Baseline:  Goal status: PARTIALLY MET - 06/01/23 - independent with variable surfaces indoors and outdoors with [facility] but increased trunk flexion and limited by fatigue and L sciatica and R groin/inner thigh pain  3.  Patient will be able to step up/down curb safely with LRAD for safety with community ambulation.  Baseline:  Goal status: MET - 06/01/23   4.  Patient will demonstrate at least 4 point improvement on FGA to improve gait stability and reduce risk for falls.  Baseline: 17/30 (04/18/23) Goal status: IN PROGRESS - 05/22/23 - deferred assessment today due to acute L sciatica  5.  Patient will improve Berg score by at least 8 points to improve safety and stability with ADLs in standing and reduce risk for falls.    Baseline: 44/56 (04/18/23) Goal status: IN PROGRESS - 05/15/23 - 49/56  6.  Patient will report >/= 70% on ABC scale to demonstrate improved balance confidence and decreased risk for falls. Baseline: 980 / 1600 = 61.3 %  Goal status: MET - 05/22/23 - 1270 / 1600 = 79.4 %  7. Patient will verbalize understanding of local Parkinson's disease community resources, including community fitness post d/c. Baseline:  Goal status: IN PROGRESS   PLAN:  PT FREQUENCY: 2x/week  PT DURATION: 8 weeks  PLANNED INTERVENTIONS: 97164- PT Re-evaluation, 97110-Therapeutic exercises, 97530- Therapeutic activity,  16109- Neuromuscular re-education, [phone]- Self Care, 09811- Manual therapy, [phone]- Gait training, [phone]- Electrical stimulation (unattended), [phone]- Ultrasound, [phone]- Ionotophoresis 4mg /ml Dexamethasone, Patient/Family education, Balance training, Stair training, Taping, Dry Needling, Joint mobilization, DME instructions, Cryotherapy, Moist heat, and 97750- Physical Performance Test or Measurement  PLAN FOR NEXT SESSION: FGA reassessment as pain resolves; progress postural and proximal LE flexibility and core/proximal LE strengthening - update/modify HEP as indicated; progress PWR! Moves   [place], [place] 06/01/2023, 4:48 PM

## 2023-06-02 NOTE — Telephone Encounter (Signed)
 Called pt and left a VM relaying message. Advised pt to give the office a call back for any further questions or concerns.

## 2023-06-05 ENCOUNTER — Ambulatory Visit: Admitting: Physical Therapy

## 2023-06-05 ENCOUNTER — Encounter: Payer: Self-pay | Admitting: Physical Therapy

## 2023-06-05 DIAGNOSIS — R2689 Other abnormalities of gait and mobility: Secondary | ICD-10-CM

## 2023-06-05 DIAGNOSIS — M6281 Muscle weakness (generalized): Secondary | ICD-10-CM

## 2023-06-05 DIAGNOSIS — R293 Abnormal posture: Secondary | ICD-10-CM

## 2023-06-05 DIAGNOSIS — R2681 Unsteadiness on feet: Secondary | ICD-10-CM

## 2023-06-05 NOTE — Therapy (Signed)
 OUTPATIENT PHYSICAL THERAPY TREATMENT    Patient Name: Victor Osborne MRN: [id number] DOB:May 05, 1932, 89 y.o., male Today's Date: 06/05/2023   END OF SESSION:  PT End of Session - 06/05/23 1016     Visit Number 14    Date for PT Re-Evaluation 06/29/23    Authorization Type Medicare & [employer]    Progress Note Due on Visit 20    PT Start Time 1016    PT Stop Time 1100    PT Time Calculation (min) 44 min    Activity Tolerance Patient tolerated treatment well    Behavior During Therapy WFL for tasks assessed/performed                 Past Medical History:  Diagnosis Date   Bipolar 1 disorder (HCC)    COPD (chronic obstructive pulmonary disease) (HCC)    Depression    First degree AV block    Hypertension    pt denies but was in medical record   Hypoglycemia, unspecified    Myalgia and myositis, unspecified    Obesity    Pneumonia 1970   Skin cancer (melanoma) (HCC)    Thyroid  disease    Unspecified hypothyroidism    UPJ obstruction, congenital    Past Surgical History:  Procedure Laterality Date   APPENDECTOMY  1939   malignant melanoma removed from right forehead  2008   PERMANENT PACEMAKER INSERTION N/A 06/19/2013   Procedure: PERMANENT PACEMAKER INSERTION;  Surgeon: Tammie Fall, MD;  Location: [facility] CATH LAB;  Service: Cardiovascular;  Laterality: N/A;   PPM GENERATOR CHANGEOUT N/A 09/28/2022   Procedure: PPM GENERATOR CHANGEOUT;  Surgeon: Tammie Fall, MD;  Location: [facility] INVASIVE CV LAB;  Service: Cardiovascular;  Laterality: N/A;   right big toe  surgery  2009   TRANSURETHRAL RESECTION OF PROSTATE  02/04/2011   Procedure: TRANSURETHRAL RESECTION OF THE PROSTATE (TURP);  Surgeon: Kristeen Peto, MD;  Location: [facility] ORS;  Service: Urology;  Laterality: N/A;   URETHRA SURGERY  2010   reconstruction   Patient Active Problem List   Diagnosis Date Noted   Hyperlipidemia 03/15/2022   TIA (transient ischemic attack) 03/14/2022   History of neoplasm  04/12/2021   Melanocytic nevi of trunk 04/12/2021   Other seborrheic keratosis 04/12/2021   Cerebral embolism with cerebral infarction 03/31/2021   Left leg weakness 03/30/2021   Chronic post-traumatic stress disorder 12/05/2017   Bipolar I disorder, current or most recent episode depressed, in partial remission (HCC) 12/05/2017   CHB (complete heart block) (HCC) 01/27/2017   HTN (hypertension) 01/27/2017   Hypothyroidism 10/11/2016   Parkinson disease (HCC) 09/08/2016   Chronic osteoarthritis 03/20/2015   Bipolar 1 disorder (HCC) 03/20/2015   Tremor 03/20/2015   Pacemaker 09/24/2013   SSS (sick sinus syndrome) (HCC) 06/19/2013   First degree AV block 04/09/2013   Allergic rhinitis 03/17/2013   Obstructive sleep apnea 12/18/2006   COPD mixed type (HCC) 12/18/2006   History of malignant melanoma of skin 12/18/2006   Diabetes mellitus (HCC) 12/18/2006    PCP: Adra Alanis, FNP   REFERRING PROVIDER: Shirline Dover, DO   REFERRING DIAG: G20.A1 (ICD-10-CM) - Parkinson's disease without dyskinesia or fluctuating manifestations (HCC)   THERAPY DIAG:  Other abnormalities of gait and mobility  Unsteadiness on feet  Muscle weakness (generalized)  Abnormal posture  RATIONALE FOR EVALUATION AND TREATMENT: Rehabilitation  ONSET DATE: Initial PD diagnosis >15 yrs ago, worsening symptoms over past few months  NEXT MD VISIT: 08/24/2023  SUBJECTIVE:                                                                                                                                                                                                         SUBJECTIVE STATEMENT: Pt reports he feels "wore out" today - "feels like someone hit me with a sledge hammer".  Pain seems to be better.  EVAL:  Pt reports he feels that he needs to use the rollator more now than previously and notes more effort when tries to go without the walker. He would like to use a 4WW with bigger wheels - he  found one that needed fixing up and he has been using this, but would prefer a new one.  He feels like his legs are getting weaker and his activity tolerance is decreasing.  As of our PD screen on 02/21/23, He noted he was starting to feel the effects of the Parkinson's more - dizziness upon coming to standing and feeling weaker. Standing for ADLs cause him more fatigue than they used to, requiring seated rest breaks. He noted increased difficulty with UB dressing when trying to don a jacket. Walking requires more effort and he tires more easily. Feels more unsteady on his feet.   Pt accompanied by: self  PAIN: Are you having pain? Yes: NPRS scale: up to 3/10 in weightbearing when triggered  Pain location: B buttock with with tiredness/weakness in LE  Pain description: ache  Aggravating factors: weightbearing/walking  Relieving factors: ibuprofen, rest (sitting or lying down)   PERTINENT HISTORY:  PPM 2 sick sinus syndrome/SA node dysfunction & complete heart block, orthostatic hypotension, COPD, OSA, HTN, DM2, Parkinson's Disease, CVA 03/2021, hypothyroidism, PTSD, bipolar I disorder   PRECAUTIONS: Fall and ICD/Pacemaker  RED FLAGS: None  WEIGHT BEARING RESTRICTIONS: No  FALLS:  Has patient fallen in last 6 months? No  LIVING ENVIRONMENT: Lives with: lives alone and with his dog Lives in: Other Independent living facility (3rd floor) Stairs: Elevator Has following equipment at home: Single point cane, Environmental consultant - 4 wheeled, and hiking pole  OCCUPATION: Retired  PLOF: Independent and Leisure: being outdoors - walking his dog, and stays active most of the day  PATIENT GOALS: "To be better prepared for balance issues and be able to adjust to avoid falling."   OBJECTIVE: (objective measures completed at initial evaluation unless otherwise dated)  DIAGNOSTIC FINDINGS:  03/14/22 & 03/15/22 - CT Head w/o contrast:  IMPRESSION: 1. No evidence of acute intracranial abnormality. 2. Chronic  small vessel ischemic disease and cerebral atrophy.  03/14/22 -  CT Angio Head & Neck: IMPRESSION: 1. Negative CTA for large vessel occlusion or other emergent finding. 2. Mild for age atheromatous change about the carotid bifurcations and carotid siphons without hemodynamically significant stenosis.  COGNITION: Overall cognitive status: Within functional limits for tasks assessed   SENSATION: WFL Except neuropathy in his feet - most notable at night  COORDINATION: Gross motor WFL B UE/LE   POSTURE:  rounded shoulders, forward head, and flexed trunk   MUSCLE LENGTH: Hamstrings: mod tight B ITB: mild/mod tight B Piriformis: mild/mod tight B Hip flexors: mod/severe tight B Quads: mod tight B Heelcord: mod tight L, mild tight R   LOWER EXTREMITY ROM:    Grossly WFL with exceptions of limitation related to muscle tightness as above  LOWER EXTREMITY MMT:    MMT Right eval Left eval R 05/18/23 L 05/18/23  Hip flexion 4+ 4+ 5 5  Hip extension 4 ^ 4 ^ 4+ ^ 4+ ^  Hip abduction 4- 4- 4+ 4+  Hip adduction 4 4 4+ 4+  Hip internal rotation 4+ 4+ 5 5  Hip external rotation 4 4 4+ 4+  Knee flexion 5 5 5 5   Knee extension 4+ 5 5 5   Ankle dorsiflexion 5 4+ 5 5  Ankle plantarflexion      Ankle inversion      Ankle eversion      (Blank rows = not tested, ^ - limited ROM)  BED MOBILITY:  Sit to supine Modified independence Supine to sit Modified independence Rolling to Right Modified independence  TRANSFERS: Assistive device utilized: None  Sit to stand: Complete Independence Stand to sit: Complete Independence Chair to chair:   Floor:    GAIT: Distance walked: clinic distances Assistive device utilized: Single point cane Level of assistance: Modified independence Gait pattern: step through pattern, decreased arm swing- Right, decreased arm swing- Left, and trunk flexed Comments: Pt reports he has been using his 4WW/rollator more commonly, especially outdoors  STAIRS:  Level  of Assistance:    Psychologist, counselling Technique:   Number of Stairs:    Height of Stairs:   Comments:   FUNCTIONAL TESTS:  5 times sit to stand: 13.69 sec w/o UE assist  Timed up and go (TUG): 10.78 sec (Normal), 10.09 sec (Manual), 12.72 sec (Cognitive - unable to complete cognitive task w/o multiple errors) 10 meter walk test: 9.00 sec w/o AD; Gait speed = 3.64 ft/sec  Berg Balance Scale: 44/56; 37-45 significant (>80%) risk for falls (04/18/23)  Dynamic Gait Index: 17/30; < 19 = high risk fall (04/18/23)  04/18/2023  Berg = 44/56; scores of 37-45 indicate a significant (>80%) risk for falls FGA = 17/30; score of <19/30 indicate a high risk for falls Berg Balance Test  Sit to Stand Able to stand without using hands and stabilize independently   Standing Unsupported Able to stand safely 2 minutes   Sitting with Back Unsupported but Feet Supported on Floor or Stool Able to sit safely and securely 2 minutes   Stand to Sit Sits safely with minimal use of hands   Transfers Able to transfer safely, definite need of hands   Standing Unsupported with Eyes Closed Able to stand 10 seconds with supervision   Standing Unsupported with Feet Together Able to place feet together independently and stand for 1 minute with supervision   From Standing, Reach Forward with Outstretched Arm Can reach forward >12 cm safely (5")   From Standing Position, Pick up Object from Floor Able to pick up  shoe safely and easily   From Standing Position, Turn to Look Behind Over each Shoulder Looks behind one side only/other side shows less weight shift   Turn 360 Degrees Able to turn 360 degrees safely but slowly   Standing Unsupported, Alternately Place Feet on Step/Stool Able to stand independently and safely and complete 8 steps in 20 seconds   Standing Unsupported, One Foot in Front Able to take small step independently and hold 30 seconds   Standing on One Leg Tries to lift leg/unable to hold 3 seconds but remains  standing independently   Total Score 44   Berg comment: 37-45 significant risk for falls(>80%)   Functional Gait  Assessment  Gait Level Surface Walks 20 ft in less than 7 sec but greater than 5.5 sec, uses assistive device, slower speed, mild gait deviations, or deviates 6-10 in outside of the 12 in walkway width.   Change in Gait Speed Able to change speed, demonstrates mild gait deviations, deviates 6-10 in outside of the 12 in walkway width, or no gait deviations, unable to achieve a major change in velocity, or uses a change in velocity, or uses an assistive device.   Gait with Horizontal Head Turns Performs head turns smoothly with slight change in gait velocity (eg, minor disruption to smooth gait path), deviates 6-10 in outside 12 in walkway width, or uses an assistive device.   Gait with Vertical Head Turns Performs task with slight change in gait velocity (eg, minor disruption to smooth gait path), deviates 6 - 10 in outside 12 in walkway width or uses assistive device   Gait and Pivot Turn Pivot turns safely within 3 sec and stops quickly with no loss of balance.   Step Over Obstacle Is able to step over one shoe box (4.5 in total height) without changing gait speed. No evidence of imbalance.   Gait with Narrow Base of Support Ambulates less than 4 steps heel to toe or cannot perform without assistance.   Gait with Eyes Closed Walks 20 ft, slow speed, abnormal gait pattern, evidence for imbalance, deviates 10-15 in outside 12 in walkway width. Requires more than 9 sec to ambulate 20 ft.   Ambulating Backwards Walks 20 ft, slow speed, abnormal gait pattern, evidence for imbalance, deviates 10-15 in outside 12 in walkway width.   Steps Alternating feet, must use rail.   Total Score 17   FGA comment: < 19 = high risk fall    05/15/23: Berg = 44/56; scores of 46-51 indicate a moderate (>50%) risk for falls Berg Balance Test  Sit to Stand Able to stand without using hands and stabilize  independently   Standing Unsupported Able to stand safely 2 minutes   Sitting with Back Unsupported but Feet Supported on Floor or Stool Able to sit safely and securely 2 minutes   Stand to Sit Sits safely with minimal use of hands   Transfers Able to transfer safely, minor use of hands   Standing Unsupported with Eyes Closed Able to stand 10 seconds safely   Standing Unsupported with Feet Together Able to place feet together independently and stand for 1 minute with supervision   From Standing, Reach Forward with Outstretched Arm Can reach confidently >25 cm (10")   From Standing Position, Pick up Object from Floor Able to pick up shoe safely and easily   From Standing Position, Turn to Look Behind Over each Shoulder Looks behind one side only/other side shows less weight shift   Turn 360  Degrees Able to turn 360 degrees safely but slowly   Standing Unsupported, Alternately Place Feet on Step/Stool Able to stand independently and safely and complete 8 steps in 20 seconds   Standing Unsupported, One Foot in Front Able to take small step independently and hold 30 seconds   Standing on One Leg Able to lift leg independently and hold 5-10 seconds   Total Score 49   Berg comment: 46-51 moderate risk for falls (>50%)    Scores as of discharge from last PT episode - 09/07/2022: 5xSTS = 12.87 sec w/o UE assist TUG: Normal = 8.34 sec w/o AD; Manual = 9.16 sec; Cognitive = 14.06 sec : 6.44 sec w/o AD Gait speed: 5.09 ft/sec w/o AD Berg = 52/56; 52-55 lower fall risk (> 25%) FGA = 25/30; 25-28 = low risk fall   PATIENT SURVEYS:  ABC scale 980 / 1600 = 61.3 % (50-80% = moderate level of physical functioning); <69% indicates risk for recurrent falls in PD 05/22/23 - 1270 / 1600 = 79.4 %  Score as of discharge from last PT episode - 09/07/2022: ABC scale: 1170 / 1600 = 73.1 %    TODAY'S TREATMENT:   06/05/23 THERAPEUTIC EXERCISE: To improve strength, endurance, ROM, and flexibility.   Demonstration, verbal and tactile cues throughout for technique.  Nustep L5 x 4 min UEs/LEs Supine hip adductor stretch with strap 2 x 30" bil-  lateral thigh supported on blue bolster or PT's leg Supine cross body ITB stretch with strap 2 x 30" bil Hooklying KTOS piriformis stretches 2 x 30" bil Hooklying TrA + alt GTB hip ABD/ER 10 x 3"  Bridge + GTB hip ABD isometric 10 x 3-5" Bridge + GTB hip ABD/ER clam 10 x 3-5" Hooklying TrA + GTB march 10 x 3"   NEUROMUSCULAR RE-EDUCATION: To improve balance, coordination, kinesthesia, posture, proprioception, and amplitude of movement. PWR! Sit to stand + GTB hip ABD isometric x 5   06/01/23  GAIT TRAINING: To normalize gait pattern and improve safety with LRAD on all surfaces.  800' with SPC on indoor and outdoor surfaces including inclines, grass and up/down curb Stairs: Level of Assistance: Modified independence Stair Negotiation Technique: Alternating Pattern Forwards with and w/o use of AD: cane with Single Rail on Left Number of Stairs: 2 x 14  Height of Stairs: 7"   MANUAL THERAPY: To promote normalized muscle tension, improved flexibility, improved joint mobility, increased ROM, and reduced pain utilizing connective tissue massage, therapeutic massage, manual TP therapy, and myofascial release. STM/DTM and manual TPR to R proximal hip adductors   THERAPEUTIC EXERCISE: To improve ROM and flexibility.  Demonstration, verbal and tactile cues throughout for technique.  Seated R hip adductor stretch x 30", x ~60" while using roller stick on proximal hip adductors Supine manual R hip adductor stretch x 30" Supine B hip adductor butterfly/frog leg stretch x 30" Supine manual R hip adductor stretch with strap 2 x 30" - 2nd rep with lateral thigh supported on blue bolster   05/29/23 THERAPEUTIC EXERCISE: To improve strength, endurance, ROM, and flexibility.  Demonstration, verbal and tactile cues throughout for technique.  NuStep - L5 x 6  minutes - UE/LE to promote reciprocal movement patterns Seated L hip hinge hamstring stretch x 30" Seated L adductor/hamstring stretch x 30" Supine L HS stretch with strap x 30" each in neutral and with hip ER & IR - best stretch with hip IR, therefore completed 2 additional reps x 30" Supine L ITB stretch with  strap 3 x 30" Hooklying L KTOS piriformis stretch 2 x 30"  NEUROMUSCULAR RE-EDUCATION: To improve balance, coordination, kinesthesia, and proprioception. Standing alt hip ABD x 10 - UE support on rollator for balance Standing alt hip extension x 10 - UE support on rollator for balance Standing alt HS curls x 10 - UE support on rollator for balance Standing alt hip flexion + ER x 10 - UE support on rollator for balance   05/24/2023 THERAPEUTIC EXERCISE: Nustep L5 x 5 min UEs/LEs Standing lateral shift into wall x10 Hooklying piriformis stretch 2x 30" Supine ITB stretch with strap 2x30" Supine adductor/hamstring stretch with strap x60" Seated adductor/hamstring stretch x 30"  NEUROMUSCULAR RE-ED: Prone hip IR/ER to inhibit muscle activation x10 Seated hip IR/ER to inhibit muscle activation x10 Hip hinge 2x10 Backwards/fwds step and rock x10  MANUAL THERAPY STM & TPR Medial hamstring and adductor proximal insertion along ischial tuberosities   05/22/2023 - 10th visit PN THERAPEUTIC EXERCISE: To improve strength, endurance, and flexibility.  Demonstration, verbal and tactile cues throughout for technique. Rec Bike - L1 x 6 min Hooklying L KTOS piriformis stretch 2 x 30" Hooklying L figure-4 piriformis stretch with slight overpressure x 30" - preferred latter stretch Hooklying L figure-4 piriformis to chest stretch x 30" each with B LE and single LE lift - preferred single leg version with good stretch reported Seated L KTOS piriformis stretch x 30" - preferred hooklying version Seated L hip hinge figure-4 piriformis stretch x 30" - preferred hooklying version  MANUAL THERAPY:  To promote normalized muscle tension, improved flexibility, and reduced pain utilizing connective tissue massage, therapeutic massage, and manual TP therapy.  STM/DTM and manual TPR to L glutes and esp piriformis Provided instruction in self-STM techniques to L piriformis/glutes using tennis ball in sitting on firm chair or standing against wall.   THERAPEUTIC ACTIVITIES: To improve functional performance.  Demonstration, verbal and tactile cues throughout for technique. ABC scale: 1270 / 1600 = 79.4 %   PATIENT EDUCATION:  Education details: HEP update - piriformis stretches and self-STM techniques to L piriformis using tennis ball  Person educated: Patient Education method: Explanation, Demonstration, Verbal cues, and Handouts Education comprehension: verbalized understanding, returned demonstration, verbal cues required, and needs further education  HOME EXERCISE PROGRAM: Access Code: [email or web address] URL: [email or web address] Date: 05/22/2023 Prepared by: Felecia Hopper  Exercises - Hip Flexor Stretch with Strap on Table  - 2 x daily - 7 x weekly - 3 reps - 30 sec hold - Supine Hamstring Stretch with Strap  - 2 x daily - 7 x weekly - 3 reps - 30 sec hold - Standing Lumbar Extension at Wall - Forearms  - 2 x daily - 7 x weekly - 3 reps - 30 sec hold - Doorway Pec Stretch at 90 Degrees Abduction  - 2 x daily - 7 x weekly - 3 reps - 30 sec hold - Supine Piriformis Stretch with Foot on Ground (Mirrored)  - 2 x daily - 7 x weekly - 3 reps - 30 sec hold - Supine Figure 4 Piriformis Stretch with Leg Extension  - 2 x daily - 7 x weekly - 3 reps - 30 sec hold - Seated Piriformis Release With [employer] (Mirrored)  - 1-2 x daily - 7 x weekly - 1-2 min hold  PWR! Moves: Seated Standing   ASSESSMENT:  CLINICAL IMPRESSION: Rosalie Gelpi" arrives to PT reporting extreme fatigue with limited tolerance for warm-up or exercises.  He is still experiencing some  pain - ache in B  posterior hip/glutes today but only 3/10.  Reviewed stretches to target ongoing pain and muscle tightness with some relief noted.  Given current fatigue/limited energy, majority of remaining exercises completed in supine targeting core/hip stability.  Will hopefully be able to resume more standing/balance exercises next visit, including completion of FGA as able.  Raenette Bumps will benefit from continued skilled PT to address ongoing pain, strength, posture and motor control deficits to improve mobility and activity tolerance with decreased risk for falls.   OBJECTIVE IMPAIRMENTS: Abnormal gait, decreased activity tolerance, decreased balance, decreased coordination, decreased endurance, decreased knowledge of use of DME, decreased mobility, difficulty walking, decreased ROM, decreased strength, decreased safety awareness, dizziness, impaired perceived functional ability, impaired flexibility, improper body mechanics, postural dysfunction, and pain.   ACTIVITY LIMITATIONS: carrying, lifting, standing, squatting, stairs, transfers, bed mobility, reach over head, locomotion level, and caring for others   PARTICIPATION LIMITATIONS: meal prep, cleaning, laundry, driving, shopping, and community activity   PERSONAL FACTORS: Age, Past/current experiences, Time since onset of injury/illness/exacerbation, and 3+ comorbidities: PPM 2 sick sinus syndrome/SA node dysfunction & complete heart block, orthostatic hypotension, COPD, OSA, HTN, DM2, Parkinson's Disease, CVA 03/2021, hypothyroidism, PTSD, bipolar I disorder   are also affecting patient's functional outcome.    REHAB POTENTIAL: Good   CLINICAL DECISION MAKING: Evolving/moderate complexity   EVALUATION COMPLEXITY: Moderate   GOALS: Goals reviewed with patient? Yes  SHORT TERM GOALS: Target date: 05/18/2023   Complete remaining standardized balance assessment and update LTG's accordingly Baseline: Berg and FGA TBA Goal status: MET - 04/18/23  Assess need  for new rollator/4WW and provide recommendations to MD for DME prescription.  Baseline:  Goal status: MET - 05/22/23 - Pt reports he is pleased with his recent 4WW customization and does not feel need to order a new 4WW/rollator.  PT assessed modification and walker appears safe and sturdy.  Patient will be independent with initial HEP Baseline:  Goal status: MET - 05/10/23  Patient will demonstrate improved B proximal LE strength to >/= 4+/5 for improved stability and ease of mobility. Baseline: Refer to above LE MMT table Goal status: MET - 05/18/23  LONG TERM GOALS: Target date: 06/29/2023  Patient will be independent with ongoing/advanced HEP for self-management at home incorporating PWR! Moves as indicated.  Baseline:  Goal status: PARTIALLY MET - 05/22/23 - Met for current HEP  2.  Patient will be able to ambulate at least 800 ft with LRAD including outdoor and unlevel surfaces, independently with no LOB, for improved community gait.   Baseline:  Goal status: PARTIALLY MET - 06/01/23 - independent with variable surfaces indoors and outdoors with [facility] but increased trunk flexion and limited by fatigue and L sciatica and R groin/inner thigh pain  3.  Patient will be able to step up/down curb safely with LRAD for safety with community ambulation.  Baseline:  Goal status: MET - 06/01/23   4.  Patient will demonstrate at least 4 point improvement on FGA to improve gait stability and reduce risk for falls.  Baseline: 17/30 (04/18/23) Goal status: IN PROGRESS - 05/22/23 - deferred assessment today due to acute L sciatica  5.  Patient will improve Berg score by at least 8 points to improve safety and stability with ADLs in standing and reduce risk for falls.    Baseline: 44/56 (04/18/23) Goal status: IN PROGRESS - 05/15/23 - 49/56  6.  Patient will report >/= 70% on ABC scale to demonstrate improved balance confidence and  decreased risk for falls. Baseline: 980 / 1600 = 61.3 %  Goal status: MET -  05/22/23 - 1270 / 1600 = 79.4 %  7. Patient will verbalize understanding of local Parkinson's disease community resources, including community fitness post d/c. Baseline:  Goal status: IN PROGRESS   PLAN:  PT FREQUENCY: 2x/week  PT DURATION: 8 weeks  PLANNED INTERVENTIONS: 97164- PT Re-evaluation, 97110-Therapeutic exercises, 97530- Therapeutic activity, 97112- Neuromuscular re-education, 97535- Self Care, 16109- Manual therapy, [phone]- Gait training, [phone]- Electrical stimulation (unattended), 97035- Ultrasound, 91478- Ionotophoresis 4mg /ml Dexamethasone, Patient/Family education, Balance training, Stair training, Taping, Dry Needling, Joint mobilization, DME instructions, Cryotherapy, Moist heat, and 97750- Physical Performance Test or Measurement  PLAN FOR NEXT SESSION: FGA reassessment as pain resolves; progress postural and proximal LE flexibility and core/proximal LE strengthening - update/modify HEP as indicated; progress PWR! Moves   [place], [place] 06/05/2023, 10:59 AM

## 2023-06-08 ENCOUNTER — Ambulatory Visit: Admitting: Physical Therapy

## 2023-06-14 ENCOUNTER — Ambulatory Visit: Admitting: Physical Therapy

## 2023-06-19 ENCOUNTER — Ambulatory Visit: Attending: Neurology | Admitting: Physical Therapy

## 2023-06-19 ENCOUNTER — Encounter: Payer: Self-pay | Admitting: Physical Therapy

## 2023-06-19 DIAGNOSIS — M6281 Muscle weakness (generalized): Secondary | ICD-10-CM | POA: Diagnosis present

## 2023-06-19 DIAGNOSIS — R2681 Unsteadiness on feet: Secondary | ICD-10-CM | POA: Insufficient documentation

## 2023-06-19 DIAGNOSIS — R293 Abnormal posture: Secondary | ICD-10-CM | POA: Diagnosis present

## 2023-06-19 DIAGNOSIS — R2689 Other abnormalities of gait and mobility: Secondary | ICD-10-CM | POA: Diagnosis present

## 2023-06-19 NOTE — Therapy (Signed)
 OUTPATIENT PHYSICAL THERAPY TREATMENT    Patient Name: Victor Osborne MRN: [id number] DOB:1932-02-08, 89 y.o., male Today's Date: 06/19/2023   END OF SESSION:  PT End of Session - 06/19/23 1144     Visit Number 15    Date for PT Re-Evaluation 06/29/23    Authorization Type Medicare & [employer]    Progress Note Due on Visit 20    PT Start Time 1144    PT Stop Time 1232    PT Time Calculation (min) 48 min    Activity Tolerance Patient tolerated treatment well    Behavior During Therapy WFL for tasks assessed/performed                  Past Medical History:  Diagnosis Date   Bipolar 1 disorder (HCC)    COPD (chronic obstructive pulmonary disease) (HCC)    Depression    First degree AV block    Hypertension    pt denies but was in medical record   Hypoglycemia, unspecified    Myalgia and myositis, unspecified    Obesity    Pneumonia 1970   Skin cancer (melanoma) (HCC)    Thyroid  disease    Unspecified hypothyroidism    UPJ obstruction, congenital    Past Surgical History:  Procedure Laterality Date   APPENDECTOMY  1939   malignant melanoma removed from right forehead  2008   PERMANENT PACEMAKER INSERTION N/A 06/19/2013   Procedure: PERMANENT PACEMAKER INSERTION;  Surgeon: Tammie Fall, MD;  Location: [facility] CATH LAB;  Service: Cardiovascular;  Laterality: N/A;   PPM GENERATOR CHANGEOUT N/A 09/28/2022   Procedure: PPM GENERATOR CHANGEOUT;  Surgeon: Tammie Fall, MD;  Location: [facility] INVASIVE CV LAB;  Service: Cardiovascular;  Laterality: N/A;   right big toe  surgery  2009   TRANSURETHRAL RESECTION OF PROSTATE  02/04/2011   Procedure: TRANSURETHRAL RESECTION OF THE PROSTATE (TURP);  Surgeon: Kristeen Peto, MD;  Location: [facility] ORS;  Service: Urology;  Laterality: N/A;   URETHRA SURGERY  2010   reconstruction   Patient Active Problem List   Diagnosis Date Noted   Hyperlipidemia 03/15/2022   TIA (transient ischemic attack) 03/14/2022   History of neoplasm  04/12/2021   Melanocytic nevi of trunk 04/12/2021   Other seborrheic keratosis 04/12/2021   Cerebral embolism with cerebral infarction 03/31/2021   Left leg weakness 03/30/2021   Chronic post-traumatic stress disorder 12/05/2017   Bipolar I disorder, current or most recent episode depressed, in partial remission (HCC) 12/05/2017   CHB (complete heart block) (HCC) 01/27/2017   HTN (hypertension) 01/27/2017   Hypothyroidism 10/11/2016   Parkinson disease (HCC) 09/08/2016   Chronic osteoarthritis 03/20/2015   Bipolar 1 disorder (HCC) 03/20/2015   Tremor 03/20/2015   Pacemaker 09/24/2013   SSS (sick sinus syndrome) (HCC) 06/19/2013   First degree AV block 04/09/2013   Allergic rhinitis 03/17/2013   Obstructive sleep apnea 12/18/2006   COPD mixed type (HCC) 12/18/2006   History of malignant melanoma of skin 12/18/2006   Diabetes mellitus (HCC) 12/18/2006    PCP: Adra Alanis, FNP   REFERRING PROVIDER: Shirline Dover, DO   REFERRING DIAG: G20.A1 (ICD-10-CM) - Parkinson's disease without dyskinesia or fluctuating manifestations (HCC)   THERAPY DIAG:  Other abnormalities of gait and mobility  Unsteadiness on feet  Abnormal posture  RATIONALE FOR EVALUATION AND TREATMENT: Rehabilitation  ONSET DATE: Initial PD diagnosis >15 yrs ago, worsening symptoms over past few months  NEXT MD VISIT: 08/24/2023   SUBJECTIVE:  SUBJECTIVE STATEMENT: Pt states the Parkinson's is getting to him today. No pain today.  EVAL:  Pt reports he feels that he needs to use the rollator more now than previously and notes more effort when tries to go without the walker. He would like to use a 4WW with bigger wheels - he found one that needed fixing up and he has been using this, but would prefer a new  one.  He feels like his legs are getting weaker and his activity tolerance is decreasing.  As of our PD screen on 02/21/23, He noted he was starting to feel the effects of the Parkinson's more - dizziness upon coming to standing and feeling weaker. Standing for ADLs cause him more fatigue than they used to, requiring seated rest breaks. He noted increased difficulty with UB dressing when trying to don a jacket. Walking requires more effort and he tires more easily. Feels more unsteady on his feet.   Pt accompanied by: self  PAIN: Are you having pain? No  PERTINENT HISTORY:  PPM 2 sick sinus syndrome/SA node dysfunction & complete heart block, orthostatic hypotension, COPD, OSA, HTN, DM2, Parkinson's Disease, CVA 03/2021, hypothyroidism, PTSD, bipolar I disorder   PRECAUTIONS: Fall and ICD/Pacemaker  RED FLAGS: None  WEIGHT BEARING RESTRICTIONS: No  FALLS:  Has patient fallen in last 6 months? No  LIVING ENVIRONMENT: Lives with: lives alone and with his dog Lives in: Other Independent living facility (3rd floor) Stairs: Elevator Has following equipment at home: Single point cane, Environmental consultant - 4 wheeled, and hiking pole  OCCUPATION: Retired  PLOF: Independent and Leisure: being outdoors - walking his dog, and stays active most of the day  PATIENT GOALS: "To be better prepared for balance issues and be able to adjust to avoid falling."   OBJECTIVE: (objective measures completed at initial evaluation unless otherwise dated)  DIAGNOSTIC FINDINGS:  03/14/22 & 03/15/22 - CT Head w/o contrast:  IMPRESSION: 1. No evidence of acute intracranial abnormality. 2. Chronic small vessel ischemic disease and cerebral atrophy.  03/14/22 - CT Angio Head & Neck: IMPRESSION: 1. Negative CTA for large vessel occlusion or other emergent finding. 2. Mild for age atheromatous change about the carotid bifurcations and carotid siphons without hemodynamically significant stenosis.  COGNITION: Overall  cognitive status: Within functional limits for tasks assessed   SENSATION: WFL Except neuropathy in his feet - most notable at night  COORDINATION: Gross motor WFL B UE/LE   POSTURE:  rounded shoulders, forward head, and flexed trunk   MUSCLE LENGTH: Hamstrings: mod tight B ITB: mild/mod tight B Piriformis: mild/mod tight B Hip flexors: mod/severe tight B Quads: mod tight B Heelcord: mod tight L, mild tight R   LOWER EXTREMITY ROM:    Grossly WFL with exceptions of limitation related to muscle tightness as above  LOWER EXTREMITY MMT:    MMT Right eval Left eval R 05/18/23 L 05/18/23  Hip flexion 4+ 4+ 5 5  Hip extension 4 ^ 4 ^ 4+ ^ 4+ ^  Hip abduction 4- 4- 4+ 4+  Hip adduction 4 4 4+ 4+  Hip internal rotation 4+ 4+ 5 5  Hip external rotation 4 4 4+ 4+  Knee flexion 5 5 5 5   Knee extension 4+ 5 5 5   Ankle dorsiflexion 5 4+ 5 5  Ankle plantarflexion      Ankle inversion      Ankle eversion      (Blank rows = not tested, ^ - limited ROM)  BED MOBILITY:  Sit to supine Modified independence Supine to sit Modified independence Rolling to Right Modified independence  TRANSFERS: Assistive device utilized: None  Sit to stand: Complete Independence Stand to sit: Complete Independence Chair to chair:   Floor:    GAIT: Distance walked: clinic distances Assistive device utilized: Single point cane Level of assistance: Modified independence Gait pattern: step through pattern, decreased arm swing- Right, decreased arm swing- Left, and trunk flexed Comments: Pt reports he has been using his 4WW/rollator more commonly, especially outdoors  STAIRS:  Level of Assistance:    Stair Negotiation Technique:   Number of Stairs:    Height of Stairs:   Comments:   FUNCTIONAL TESTS:  5 times sit to stand: 13.69 sec w/o UE assist  Timed up and go (TUG): 10.78 sec (Normal), 10.09 sec (Manual), 12.72 sec (Cognitive - unable to complete cognitive task w/o multiple errors) 10 meter  walk test: 9.00 sec w/o AD; Gait speed = 3.64 ft/sec  Berg Balance Scale: 44/56; 37-45 significant (>80%) risk for falls (04/18/23)  Dynamic Gait Index: 17/30; < 19 = high risk fall (04/18/23)  04/18/2023  Berg = 44/56; scores of 37-45 indicate a significant (>80%) risk for falls FGA = 17/30; score of <19/30 indicate a high risk for falls Berg Balance Test  Sit to Stand Able to stand without using hands and stabilize independently   Standing Unsupported Able to stand safely 2 minutes   Sitting with Back Unsupported but Feet Supported on Floor or Stool Able to sit safely and securely 2 minutes   Stand to Sit Sits safely with minimal use of hands   Transfers Able to transfer safely, definite need of hands   Standing Unsupported with Eyes Closed Able to stand 10 seconds with supervision   Standing Unsupported with Feet Together Able to place feet together independently and stand for 1 minute with supervision   From Standing, Reach Forward with Outstretched Arm Can reach forward >12 cm safely (5")   From Standing Position, Pick up Object from Floor Able to pick up shoe safely and easily   From Standing Position, Turn to Look Behind Over each Shoulder Looks behind one side only/other side shows less weight shift   Turn 360 Degrees Able to turn 360 degrees safely but slowly   Standing Unsupported, Alternately Place Feet on Step/Stool Able to stand independently and safely and complete 8 steps in 20 seconds   Standing Unsupported, One Foot in Front Able to take small step independently and hold 30 seconds   Standing on One Leg Tries to lift leg/unable to hold 3 seconds but remains standing independently   Total Score 44   Berg comment: 37-45 significant risk for falls(>80%)   Functional Gait  Assessment  Gait Level Surface Walks 20 ft in less than 7 sec but greater than 5.5 sec, uses assistive device, slower speed, mild gait deviations, or deviates 6-10 in outside of the 12 in walkway width.   Change in  Gait Speed Able to change speed, demonstrates mild gait deviations, deviates 6-10 in outside of the 12 in walkway width, or no gait deviations, unable to achieve a major change in velocity, or uses a change in velocity, or uses an assistive device.   Gait with Horizontal Head Turns Performs head turns smoothly with slight change in gait velocity (eg, minor disruption to smooth gait path), deviates 6-10 in outside 12 in walkway width, or uses an assistive device.   Gait with Vertical Head Turns Performs task with slight  change in gait velocity (eg, minor disruption to smooth gait path), deviates 6 - 10 in outside 12 in walkway width or uses assistive device   Gait and Pivot Turn Pivot turns safely within 3 sec and stops quickly with no loss of balance.   Step Over Obstacle Is able to step over one shoe box (4.5 in total height) without changing gait speed. No evidence of imbalance.   Gait with Narrow Base of Support Ambulates less than 4 steps heel to toe or cannot perform without assistance.   Gait with Eyes Closed Walks 20 ft, slow speed, abnormal gait pattern, evidence for imbalance, deviates 10-15 in outside 12 in walkway width. Requires more than 9 sec to ambulate 20 ft.   Ambulating Backwards Walks 20 ft, slow speed, abnormal gait pattern, evidence for imbalance, deviates 10-15 in outside 12 in walkway width.   Steps Alternating feet, must use rail.   Total Score 17   FGA comment: < 19 = high risk fall    05/15/23: Berg = 44/56; scores of 46-51 indicate a moderate (>50%) risk for falls Berg Balance Test  Sit to Stand Able to stand without using hands and stabilize independently   Standing Unsupported Able to stand safely 2 minutes   Sitting with Back Unsupported but Feet Supported on Floor or Stool Able to sit safely and securely 2 minutes   Stand to Sit Sits safely with minimal use of hands   Transfers Able to transfer safely, minor use of hands   Standing Unsupported with Eyes Closed Able  to stand 10 seconds safely   Standing Unsupported with Feet Together Able to place feet together independently and stand for 1 minute with supervision   From Standing, Reach Forward with Outstretched Arm Can reach confidently >25 cm (10")   From Standing Position, Pick up Object from Floor Able to pick up shoe safely and easily   From Standing Position, Turn to Look Behind Over each Shoulder Looks behind one side only/other side shows less weight shift   Turn 360 Degrees Able to turn 360 degrees safely but slowly   Standing Unsupported, Alternately Place Feet on Step/Stool Able to stand independently and safely and complete 8 steps in 20 seconds   Standing Unsupported, One Foot in Front Able to take small step independently and hold 30 seconds   Standing on One Leg Able to lift leg independently and hold 5-10 seconds   Total Score 49   Berg comment: 46-51 moderate risk for falls (>50%)    06/19/23: FGA = 24/30; 19-24 indicates medium risk fall  Functional Gait  Assessment  Gait Level Surface Walks 20 ft in less than 5.5 sec, no assistive devices, good speed, no evidence for imbalance, normal gait pattern, deviates no more than 6 in outside of the 12 in walkway width.   Change in Gait Speed Able to smoothly change walking speed without loss of balance or gait deviation. Deviate no more than 6 in outside of the 12 in walkway width.   Gait with Horizontal Head Turns Performs head turns smoothly with no change in gait. Deviates no more than 6 in outside 12 in walkway width   Gait with Vertical Head Turns Performs task with slight change in gait velocity (eg, minor disruption to smooth gait path), deviates 6 - 10 in outside 12 in walkway width or uses assistive device   Gait and Pivot Turn Pivot turns safely in greater than 3 sec and stops with no loss of balance,  or pivot turns safely within 3 sec and stops with mild imbalance, requires small steps to catch balance.   Step Over Obstacle Is able to step  over 2 stacked shoe boxes taped together (9 in total height) without changing gait speed. No evidence of imbalance.   Gait with Narrow Base of Support Ambulates 7-9 steps.   Gait with Eyes Closed Walks 20 ft, uses assistive device, slower speed, mild gait deviations, deviates 6-10 in outside 12 in walkway width. Ambulates 20 ft in less than 9 sec but greater than 7 sec.   Ambulating Backwards Walks 20 ft, uses assistive device, slower speed, mild gait deviations, deviates 6-10 in outside 12 in walkway width.   Steps Alternating feet, must use rail.   Total Score 24   FGA comment: 19-24 = medium risk fall       Scores as of discharge from last PT episode - 09/07/2022: 5xSTS = 12.87 sec w/o UE assist TUG: Normal = 8.34 sec w/o AD; Manual = 9.16 sec; Cognitive = 14.06 sec : 6.44 sec w/o AD Gait speed: 5.09 ft/sec w/o AD Berg = 52/56; 52-55 lower fall risk (> 25%) FGA = 25/30; 25-28 = low risk fall   PATIENT SURVEYS:  ABC scale 980 / 1600 = 61.3 % (50-80% = moderate level of physical functioning); <69% indicates risk for recurrent falls in PD 05/22/23 - 1270 / 1600 = 79.4 %  Score as of discharge from last PT episode - 09/07/2022: ABC scale: 1170 / 1600 = 73.1 %    TODAY'S TREATMENT:   06/19/23 THERAPEUTIC EXERCISE: To improve strength and endurance.  Demonstration, verbal and tactile cues throughout for technique.  Nustep L6 x 6 min - UE/LE to promote reciprocal movement patterns  PHYSICAL PERFORMANCE TEST or MEASUREMENT: FGA = 24/30; 19-24 indicates medium risk fall  Functional Gait  Assessment  Gait Level Surface Walks 20 ft in less than 5.5 sec, no assistive devices, good speed, no evidence for imbalance, normal gait pattern, deviates no more than 6 in outside of the 12 in walkway width.   Change in Gait Speed Able to smoothly change walking speed without loss of balance or gait deviation. Deviate no more than 6 in outside of the 12 in walkway width.   Gait with Horizontal Head  Turns Performs head turns smoothly with no change in gait. Deviates no more than 6 in outside 12 in walkway width   Gait with Vertical Head Turns Performs task with slight change in gait velocity (eg, minor disruption to smooth gait path), deviates 6 - 10 in outside 12 in walkway width or uses assistive device   Gait and Pivot Turn Pivot turns safely in greater than 3 sec and stops with no loss of balance, or pivot turns safely within 3 sec and stops with mild imbalance, requires small steps to catch balance.   Step Over Obstacle Is able to step over 2 stacked shoe boxes taped together (9 in total height) without changing gait speed. No evidence of imbalance.   Gait with Narrow Base of Support Ambulates 7-9 steps.   Gait with Eyes Closed Walks 20 ft, uses assistive device, slower speed, mild gait deviations, deviates 6-10 in outside 12 in walkway width. Ambulates 20 ft in less than 9 sec but greater than 7 sec.   Ambulating Backwards Walks 20 ft, uses assistive device, slower speed, mild gait deviations, deviates 6-10 in outside 12 in walkway width.   Steps Alternating feet, must use rail.  Total Score 24   FGA comment: 19-24 = medium risk fall      THERAPEUTIC ACTIVITIES: To improve functional performance.  Demonstration, verbal and tactile cues throughout for technique. Sit to stand from low seat height w/o UE assist - working on rocking for momentum and "nose over toes" to facilitate fwd weight shift to help create lift for hips  GAIT TRAINING: To normalize gait pattern, improve safety with LRAD (hiking pole), and improve posture. 350' with single hiking pole on R - cues for reciprocal step pattern and increasing arm swing   06/05/23 THERAPEUTIC EXERCISE: To improve strength, endurance, ROM, and flexibility.  Demonstration, verbal and tactile cues throughout for technique.  Nustep L5 x 4 min UEs/LEs Supine hip adductor stretch with strap 2 x 30" bil-  lateral thigh supported on blue bolster  or PT's leg Supine cross body ITB stretch with strap 2 x 30" bil Hooklying KTOS piriformis stretches 2 x 30" bil Hooklying TrA + alt GTB hip ABD/ER 10 x 3"  Bridge + GTB hip ABD isometric 10 x 3-5" Bridge + GTB hip ABD/ER clam 10 x 3-5" Hooklying TrA + GTB march 10 x 3"   NEUROMUSCULAR RE-EDUCATION: To improve balance, coordination, kinesthesia, posture, proprioception, and amplitude of movement. PWR! Sit to stand + GTB hip ABD isometric x 5   06/01/23  GAIT TRAINING: To normalize gait pattern and improve safety with LRAD on all surfaces.  800' with SPC on indoor and outdoor surfaces including inclines, grass and up/down curb Stairs: Level of Assistance: Modified independence Stair Negotiation Technique: Alternating Pattern Forwards with and w/o use of AD: cane with Single Rail on Left Number of Stairs: 2 x 14  Height of Stairs: 7"   MANUAL THERAPY: To promote normalized muscle tension, improved flexibility, improved joint mobility, increased ROM, and reduced pain utilizing connective tissue massage, therapeutic massage, manual TP therapy, and myofascial release. STM/DTM and manual TPR to R proximal hip adductors   THERAPEUTIC EXERCISE: To improve ROM and flexibility.  Demonstration, verbal and tactile cues throughout for technique.  Seated R hip adductor stretch x 30", x ~60" while using roller stick on proximal hip adductors Supine manual R hip adductor stretch x 30" Supine B hip adductor butterfly/frog leg stretch x 30" Supine manual R hip adductor stretch with strap 2 x 30" - 2nd rep with lateral thigh supported on blue bolster   05/29/23 THERAPEUTIC EXERCISE: To improve strength, endurance, ROM, and flexibility.  Demonstration, verbal and tactile cues throughout for technique.  NuStep - L5 x 6 minutes - UE/LE to promote reciprocal movement patterns Seated L hip hinge hamstring stretch x 30" Seated L adductor/hamstring stretch x 30" Supine L HS stretch with strap x 30" each in  neutral and with hip ER & IR - best stretch with hip IR, therefore completed 2 additional reps x 30" Supine L ITB stretch with strap 3 x 30" Hooklying L KTOS piriformis stretch 2 x 30"  NEUROMUSCULAR RE-EDUCATION: To improve balance, coordination, kinesthesia, and proprioception. Standing alt hip ABD x 10 - UE support on rollator for balance Standing alt hip extension x 10 - UE support on rollator for balance Standing alt HS curls x 10 - UE support on rollator for balance Standing alt hip flexion + ER x 10 - UE support on rollator for balance   05/24/2023 THERAPEUTIC EXERCISE: Nustep L5 x 5 min UEs/LEs Standing lateral shift into wall x10 Hooklying piriformis stretch 2x 30" Supine ITB stretch with strap 2x30" Supine adductor/hamstring  stretch with strap x60" Seated adductor/hamstring stretch x 30"  NEUROMUSCULAR RE-ED: Prone hip IR/ER to inhibit muscle activation x10 Seated hip IR/ER to inhibit muscle activation x10 Hip hinge 2x10 Backwards/fwds step and rock x10  MANUAL THERAPY STM & TPR Medial hamstring and adductor proximal insertion along ischial tuberosities   05/22/2023 - 10th visit PN THERAPEUTIC EXERCISE: To improve strength, endurance, and flexibility.  Demonstration, verbal and tactile cues throughout for technique. Rec Bike - L1 x 6 min Hooklying L KTOS piriformis stretch 2 x 30" Hooklying L figure-4 piriformis stretch with slight overpressure x 30" - preferred latter stretch Hooklying L figure-4 piriformis to chest stretch x 30" each with B LE and single LE lift - preferred single leg version with good stretch reported Seated L KTOS piriformis stretch x 30" - preferred hooklying version Seated L hip hinge figure-4 piriformis stretch x 30" - preferred hooklying version  MANUAL THERAPY: To promote normalized muscle tension, improved flexibility, and reduced pain utilizing connective tissue massage, therapeutic massage, and manual TP therapy.  STM/DTM and manual TPR to L  glutes and esp piriformis Provided instruction in self-STM techniques to L piriformis/glutes using tennis ball in sitting on firm chair or standing against wall.   THERAPEUTIC ACTIVITIES: To improve functional performance.  Demonstration, verbal and tactile cues throughout for technique. ABC scale: 1270 / 1600 = 79.4 %   PATIENT EDUCATION:  Education details: HEP update - piriformis stretches and self-STM techniques to L piriformis using tennis ball  Person educated: Patient Education method: Explanation, Demonstration, Verbal cues, and Handouts Education comprehension: verbalized understanding, returned demonstration, verbal cues required, and needs further education  HOME EXERCISE PROGRAM: Access Code: [email or web address] URL: [email or web address] Date: 05/22/2023 Prepared by: Felecia Hopper  Exercises - Hip Flexor Stretch with Strap on Table  - 2 x daily - 7 x weekly - 3 reps - 30 sec hold - Supine Hamstring Stretch with Strap  - 2 x daily - 7 x weekly - 3 reps - 30 sec hold - Standing Lumbar Extension at Wall - Forearms  - 2 x daily - 7 x weekly - 3 reps - 30 sec hold - Doorway Pec Stretch at 90 Degrees Abduction  - 2 x daily - 7 x weekly - 3 reps - 30 sec hold - Supine Piriformis Stretch with Foot on Ground (Mirrored)  - 2 x daily - 7 x weekly - 3 reps - 30 sec hold - Supine Figure 4 Piriformis Stretch with Leg Extension  - 2 x daily - 7 x weekly - 3 reps - 30 sec hold - Seated Piriformis Release With [employer] (Mirrored)  - 1-2 x daily - 7 x weekly - 1-2 min hold  PWR! Moves: Seated Standing   ASSESSMENT:  CLINICAL IMPRESSION: Donaldo Teegarden" reports the sciatic and thigh pain he was experiencing last month seems to have resolved but he continues to feel more fatigued with increased need for rest breaks today.  Finally able to complete FGA with score improved to 24/30 indicating a reduction in fall risk from high to medium (bordering on low fall risk) - LTG #4 met.  He  notes the greatest ongoing difficulty with sit to stand transitions, especially from low surfaces, and stamina with walking.  All of his canes tend to be on the short side for his height of 6'3" adding to his stooped posture, therefore trialed gait with hiking pole to promote increased upright posture - cues and practice necessary to coordinate sequencing of pole but improved  posture observed.  During seated rest breaks worked on sit to stand using PWR! Up pattern to facilitate forward weight shift and momentum to increase ease of rising to stand.  3 visits remaining in current POC during which we will continue to address ongoing concerns and review HEP and PWR! Moves in preparation for transition to his HEP at the end of the current POC.  Raenette Bumps will benefit from continued skilled PT to address ongoing strength, posture and motor control deficits to improve mobility and activity tolerance with decreased risk for falls.   OBJECTIVE IMPAIRMENTS: Abnormal gait, decreased activity tolerance, decreased balance, decreased coordination, decreased endurance, decreased knowledge of use of DME, decreased mobility, difficulty walking, decreased ROM, decreased strength, decreased safety awareness, dizziness, impaired perceived functional ability, impaired flexibility, improper body mechanics, postural dysfunction, and pain.   ACTIVITY LIMITATIONS: carrying, lifting, standing, squatting, stairs, transfers, bed mobility, reach over head, locomotion level, and caring for others   PARTICIPATION LIMITATIONS: meal prep, cleaning, laundry, driving, shopping, and community activity   PERSONAL FACTORS: Age, Past/current experiences, Time since onset of injury/illness/exacerbation, and 3+ comorbidities: PPM 2 sick sinus syndrome/SA node dysfunction & complete heart block, orthostatic hypotension, COPD, OSA, HTN, DM2, Parkinson's Disease, CVA 03/2021, hypothyroidism, PTSD, bipolar I disorder   are also affecting patient's  functional outcome.    REHAB POTENTIAL: Good   CLINICAL DECISION MAKING: Evolving/moderate complexity   EVALUATION COMPLEXITY: Moderate   GOALS: Goals reviewed with patient? Yes  SHORT TERM GOALS: Target date: 05/18/2023   Complete remaining standardized balance assessment and update LTG's accordingly Baseline: Berg and FGA TBA Goal status: MET - 04/18/23  Assess need for new rollator/4WW and provide recommendations to MD for DME prescription.  Baseline:  Goal status: MET - 05/22/23 - Pt reports he is pleased with his recent 4WW customization and does not feel need to order a new 4WW/rollator.  PT assessed modification and walker appears safe and sturdy.  Patient will be independent with initial HEP Baseline:  Goal status: MET - 05/10/23  Patient will demonstrate improved B proximal LE strength to >/= 4+/5 for improved stability and ease of mobility. Baseline: Refer to above LE MMT table Goal status: MET - 05/18/23  LONG TERM GOALS: Target date: 06/29/2023  Patient will be independent with ongoing/advanced HEP for self-management at home incorporating PWR! Moves as indicated.  Baseline:  Goal status: PARTIALLY MET - 05/22/23 - Met for current HEP  2.  Patient will be able to ambulate at least 800 ft with LRAD including outdoor and unlevel surfaces, independently with no LOB, for improved community gait.   Baseline:  Goal status: PARTIALLY MET - 06/01/23 - independent with variable surfaces indoors and outdoors with [facility] but increased trunk flexion and limited by fatigue and L sciatica and R groin/inner thigh pain  3.  Patient will be able to step up/down curb safely with LRAD for safety with community ambulation.  Baseline:  Goal status: MET - 06/01/23   4.  Patient will demonstrate at least 4 point improvement on FGA to improve gait stability and reduce risk for falls.  Baseline: 17/30 (04/18/23) 05/22/23 - deferred assessment today due to acute L sciatica Goal status: MET - 06/19/23 -  24/30  5.  Patient will improve Berg score by at least 8 points to improve safety and stability with ADLs in standing and reduce risk for falls.    Baseline: 44/56 (04/18/23) Goal status: IN PROGRESS - 05/15/23 - 49/56     6.  Patient will  report >/= 70% on ABC scale to demonstrate improved balance confidence and decreased risk for falls. Baseline: 980 / 1600 = 61.3 %  Goal status: MET - 05/22/23 - 1270 / 1600 = 79.4 %  7. Patient will verbalize understanding of local Parkinson's disease community resources, including community fitness post d/c. Baseline:  Goal status: IN PROGRESS   PLAN:  PT FREQUENCY: 2x/week  PT DURATION: 8 weeks  PLANNED INTERVENTIONS: 97164- PT Re-evaluation, 97110-Therapeutic exercises, 97530- Therapeutic activity, 97112- Neuromuscular re-education, 97535- Self Care, 16109- Manual therapy, [phone]- Gait training, [phone]- Electrical stimulation (unattended), 97035- Ultrasound, 91478- Ionotophoresis 4mg /ml Dexamethasone, Patient/Family education, Balance training, Stair training, Taping, Dry Needling, Joint mobilization, DME instructions, Cryotherapy, Moist heat, and 97750- Physical Performance Test or Measurement  PLAN FOR NEXT SESSION: review/progress postural and proximal LE flexibility and core/proximal LE strengthening - update/modify HEP as indicated; review/progress PWR! Moves   [place], [place] 06/19/2023, 12:44 PM

## 2023-06-22 ENCOUNTER — Encounter: Payer: Self-pay | Admitting: Physical Therapy

## 2023-06-22 ENCOUNTER — Ambulatory Visit: Admitting: Physical Therapy

## 2023-06-22 DIAGNOSIS — R293 Abnormal posture: Secondary | ICD-10-CM

## 2023-06-22 DIAGNOSIS — M6281 Muscle weakness (generalized): Secondary | ICD-10-CM

## 2023-06-22 DIAGNOSIS — R2681 Unsteadiness on feet: Secondary | ICD-10-CM

## 2023-06-22 DIAGNOSIS — R2689 Other abnormalities of gait and mobility: Secondary | ICD-10-CM | POA: Diagnosis not present

## 2023-06-22 NOTE — Therapy (Signed)
 OUTPATIENT PHYSICAL THERAPY TREATMENT    Patient Name: Victor Osborne MRN: [id number] DOB:02-Feb-1932, 89 y.o., male Today's Date: 06/22/2023   END OF SESSION:  PT End of Session - 06/22/23 1100     Visit Number 16    Date for PT Re-Evaluation 06/29/23    Authorization Type Medicare & [employer]    Progress Note Due on Visit 20    PT Start Time 1100    PT Stop Time 1142    PT Time Calculation (min) 42 min    Activity Tolerance Patient tolerated treatment well    Behavior During Therapy WFL for tasks assessed/performed                   Past Medical History:  Diagnosis Date   Bipolar 1 disorder (HCC)    COPD (chronic obstructive pulmonary disease) (HCC)    Depression    First degree AV block    Hypertension    pt denies but was in medical record   Hypoglycemia, unspecified    Myalgia and myositis, unspecified    Obesity    Pneumonia 1970   Skin cancer (melanoma) (HCC)    Thyroid  disease    Unspecified hypothyroidism    UPJ obstruction, congenital    Past Surgical History:  Procedure Laterality Date   APPENDECTOMY  1939   malignant melanoma removed from right forehead  2008   PERMANENT PACEMAKER INSERTION N/A 06/19/2013   Procedure: PERMANENT PACEMAKER INSERTION;  Surgeon: Tammie Fall, MD;  Location: [facility] CATH LAB;  Service: Cardiovascular;  Laterality: N/A;   PPM GENERATOR CHANGEOUT N/A 09/28/2022   Procedure: PPM GENERATOR CHANGEOUT;  Surgeon: Tammie Fall, MD;  Location: [facility] INVASIVE CV LAB;  Service: Cardiovascular;  Laterality: N/A;   right big toe  surgery  2009   TRANSURETHRAL RESECTION OF PROSTATE  02/04/2011   Procedure: TRANSURETHRAL RESECTION OF THE PROSTATE (TURP);  Surgeon: Kristeen Peto, MD;  Location: [facility] ORS;  Service: Urology;  Laterality: N/A;   URETHRA SURGERY  2010   reconstruction   Patient Active Problem List   Diagnosis Date Noted   Hyperlipidemia 03/15/2022   TIA (transient ischemic attack) 03/14/2022   History of neoplasm  04/12/2021   Melanocytic nevi of trunk 04/12/2021   Other seborrheic keratosis 04/12/2021   Cerebral embolism with cerebral infarction 03/31/2021   Left leg weakness 03/30/2021   Chronic post-traumatic stress disorder 12/05/2017   Bipolar I disorder, current or most recent episode depressed, in partial remission (HCC) 12/05/2017   CHB (complete heart block) (HCC) 01/27/2017   HTN (hypertension) 01/27/2017   Hypothyroidism 10/11/2016   Parkinson disease (HCC) 09/08/2016   Chronic osteoarthritis 03/20/2015   Bipolar 1 disorder (HCC) 03/20/2015   Tremor 03/20/2015   Pacemaker 09/24/2013   SSS (sick sinus syndrome) (HCC) 06/19/2013   First degree AV block 04/09/2013   Allergic rhinitis 03/17/2013   Obstructive sleep apnea 12/18/2006   COPD mixed type (HCC) 12/18/2006   History of malignant melanoma of skin 12/18/2006   Diabetes mellitus (HCC) 12/18/2006    PCP: Adra Alanis, FNP   REFERRING PROVIDER: Shirline Dover, DO   REFERRING DIAG: G20.A1 (ICD-10-CM) - Parkinson's disease without dyskinesia or fluctuating manifestations (HCC)   THERAPY DIAG:  Other abnormalities of gait and mobility  Unsteadiness on feet  Abnormal posture  Muscle weakness (generalized)  RATIONALE FOR EVALUATION AND TREATMENT: Rehabilitation  ONSET DATE: Initial PD diagnosis >15 yrs ago, worsening symptoms over past few months  NEXT MD  VISIT: 08/24/2023   SUBJECTIVE:                                                                                                                                                                                                         SUBJECTIVE STATEMENT: Pt purchased a pair of hiking poles which he brought with him today.  EVAL:  Pt reports he feels that he needs to use the rollator more now than previously and notes more effort when tries to go without the walker. He would like to use a 4WW with bigger wheels - he found one that needed fixing up and he has  been using this, but would prefer a new one.  He feels like his legs are getting weaker and his activity tolerance is decreasing.  As of our PD screen on 02/21/23, He noted he was starting to feel the effects of the Parkinson's more - dizziness upon coming to standing and feeling weaker. Standing for ADLs cause him more fatigue than they used to, requiring seated rest breaks. He noted increased difficulty with UB dressing when trying to don a jacket. Walking requires more effort and he tires more easily. Feels more unsteady on his feet.   Pt accompanied by: self  PAIN: Are you having pain? No  PERTINENT HISTORY:  PPM 2 sick sinus syndrome/SA node dysfunction & complete heart block, orthostatic hypotension, COPD, OSA, HTN, DM2, Parkinson's Disease, CVA 03/2021, hypothyroidism, PTSD, bipolar I disorder   PRECAUTIONS: Fall and ICD/Pacemaker  RED FLAGS: None  WEIGHT BEARING RESTRICTIONS: No  FALLS:  Has patient fallen in last 6 months? No  LIVING ENVIRONMENT: Lives with: lives alone and with his dog Lives in: Other Independent living facility (3rd floor) Stairs: Elevator Has following equipment at home: Single point cane, Environmental consultant - 4 wheeled, and hiking pole  OCCUPATION: Retired  PLOF: Independent and Leisure: being outdoors - walking his dog, and stays active most of the day  PATIENT GOALS: "To be better prepared for balance issues and be able to adjust to avoid falling."   OBJECTIVE: (objective measures completed at initial evaluation unless otherwise dated)  DIAGNOSTIC FINDINGS:  03/14/22 & 03/15/22 - CT Head w/o contrast:  IMPRESSION: 1. No evidence of acute intracranial abnormality. 2. Chronic small vessel ischemic disease and cerebral atrophy.  03/14/22 - CT Angio Head & Neck: IMPRESSION: 1. Negative CTA for large vessel occlusion or other emergent finding. 2. Mild for age atheromatous change about the carotid bifurcations and carotid siphons without hemodynamically  significant stenosis.  COGNITION: Overall cognitive status: Within functional limits  for tasks assessed   SENSATION: WFL Except neuropathy in his feet - most notable at night  COORDINATION: Gross motor WFL B UE/LE   POSTURE:  rounded shoulders, forward head, and flexed trunk   MUSCLE LENGTH: Hamstrings: mod tight B ITB: mild/mod tight B Piriformis: mild/mod tight B Hip flexors: mod/severe tight B Quads: mod tight B Heelcord: mod tight L, mild tight R   LOWER EXTREMITY ROM:    Grossly WFL with exceptions of limitation related to muscle tightness as above  LOWER EXTREMITY MMT:    MMT Right eval Left eval R 05/18/23 L 05/18/23  Hip flexion 4+ 4+ 5 5  Hip extension 4 ^ 4 ^ 4+ ^ 4+ ^  Hip abduction 4- 4- 4+ 4+  Hip adduction 4 4 4+ 4+  Hip internal rotation 4+ 4+ 5 5  Hip external rotation 4 4 4+ 4+  Knee flexion 5 5 5 5   Knee extension 4+ 5 5 5   Ankle dorsiflexion 5 4+ 5 5  Ankle plantarflexion      Ankle inversion      Ankle eversion      (Blank rows = not tested, ^ - limited ROM)  BED MOBILITY:  Sit to supine Modified independence Supine to sit Modified independence Rolling to Right Modified independence  TRANSFERS: Assistive device utilized: None  Sit to stand: Complete Independence Stand to sit: Complete Independence Chair to chair:   Floor:    GAIT: Distance walked: clinic distances Assistive device utilized: Single point cane Level of assistance: Modified independence Gait pattern: step through pattern, decreased arm swing- Right, decreased arm swing- Left, and trunk flexed Comments: Pt reports he has been using his 4WW/rollator more commonly, especially outdoors  STAIRS:  Level of Assistance:    Psychologist, counselling Technique:   Number of Stairs:    Height of Stairs:   Comments:   FUNCTIONAL TESTS:  5 times sit to stand: 13.69 sec w/o UE assist  Timed up and go (TUG): 10.78 sec (Normal), 10.09 sec (Manual), 12.72 sec (Cognitive - unable to complete  cognitive task w/o multiple errors) 10 meter walk test: 9.00 sec w/o AD; Gait speed = 3.64 ft/sec  Berg Balance Scale: 44/56; 37-45 significant (>80%) risk for falls (04/18/23)  Dynamic Gait Index: 17/30; < 19 = high risk fall (04/18/23)  04/18/2023  Berg = 44/56; scores of 37-45 indicate a significant (>80%) risk for falls FGA = 17/30; score of <19/30 indicate a high risk for falls Berg Balance Test  Sit to Stand Able to stand without using hands and stabilize independently   Standing Unsupported Able to stand safely 2 minutes   Sitting with Back Unsupported but Feet Supported on Floor or Stool Able to sit safely and securely 2 minutes   Stand to Sit Sits safely with minimal use of hands   Transfers Able to transfer safely, definite need of hands   Standing Unsupported with Eyes Closed Able to stand 10 seconds with supervision   Standing Unsupported with Feet Together Able to place feet together independently and stand for 1 minute with supervision   From Standing, Reach Forward with Outstretched Arm Can reach forward >12 cm safely (5")   From Standing Position, Pick up Object from Floor Able to pick up shoe safely and easily   From Standing Position, Turn to Look Behind Over each Shoulder Looks behind one side only/other side shows less weight shift   Turn 360 Degrees Able to turn 360 degrees safely but slowly   Standing  Unsupported, Alternately Place Feet on Step/Stool Able to stand independently and safely and complete 8 steps in 20 seconds   Standing Unsupported, One Foot in Front Able to take small step independently and hold 30 seconds   Standing on One Leg Tries to lift leg/unable to hold 3 seconds but remains standing independently   Total Score 44   Berg comment: 37-45 significant risk for falls(>80%)   Functional Gait  Assessment  Gait Level Surface Walks 20 ft in less than 7 sec but greater than 5.5 sec, uses assistive device, slower speed, mild gait deviations, or deviates 6-10 in  outside of the 12 in walkway width.   Change in Gait Speed Able to change speed, demonstrates mild gait deviations, deviates 6-10 in outside of the 12 in walkway width, or no gait deviations, unable to achieve a major change in velocity, or uses a change in velocity, or uses an assistive device.   Gait with Horizontal Head Turns Performs head turns smoothly with slight change in gait velocity (eg, minor disruption to smooth gait path), deviates 6-10 in outside 12 in walkway width, or uses an assistive device.   Gait with Vertical Head Turns Performs task with slight change in gait velocity (eg, minor disruption to smooth gait path), deviates 6 - 10 in outside 12 in walkway width or uses assistive device   Gait and Pivot Turn Pivot turns safely within 3 sec and stops quickly with no loss of balance.   Step Over Obstacle Is able to step over one shoe box (4.5 in total height) without changing gait speed. No evidence of imbalance.   Gait with Narrow Base of Support Ambulates less than 4 steps heel to toe or cannot perform without assistance.   Gait with Eyes Closed Walks 20 ft, slow speed, abnormal gait pattern, evidence for imbalance, deviates 10-15 in outside 12 in walkway width. Requires more than 9 sec to ambulate 20 ft.   Ambulating Backwards Walks 20 ft, slow speed, abnormal gait pattern, evidence for imbalance, deviates 10-15 in outside 12 in walkway width.   Steps Alternating feet, must use rail.   Total Score 17   FGA comment: < 19 = high risk fall    05/15/23: Berg = 44/56; scores of 46-51 indicate a moderate (>50%) risk for falls Berg Balance Test  Sit to Stand Able to stand without using hands and stabilize independently   Standing Unsupported Able to stand safely 2 minutes   Sitting with Back Unsupported but Feet Supported on Floor or Stool Able to sit safely and securely 2 minutes   Stand to Sit Sits safely with minimal use of hands   Transfers Able to transfer safely, minor use of  hands   Standing Unsupported with Eyes Closed Able to stand 10 seconds safely   Standing Unsupported with Feet Together Able to place feet together independently and stand for 1 minute with supervision   From Standing, Reach Forward with Outstretched Arm Can reach confidently >25 cm (10")   From Standing Position, Pick up Object from Floor Able to pick up shoe safely and easily   From Standing Position, Turn to Look Behind Over each Shoulder Looks behind one side only/other side shows less weight shift   Turn 360 Degrees Able to turn 360 degrees safely but slowly   Standing Unsupported, Alternately Place Feet on Step/Stool Able to stand independently and safely and complete 8 steps in 20 seconds   Standing Unsupported, One Foot in Front Able to take  small step independently and hold 30 seconds   Standing on One Leg Able to lift leg independently and hold 5-10 seconds   Total Score 49   Berg comment: 46-51 moderate risk for falls (>50%)    06/19/23: FGA = 24/30; 19-24 indicates medium risk fall  Functional Gait  Assessment  Gait Level Surface Walks 20 ft in less than 5.5 sec, no assistive devices, good speed, no evidence for imbalance, normal gait pattern, deviates no more than 6 in outside of the 12 in walkway width.   Change in Gait Speed Able to smoothly change walking speed without loss of balance or gait deviation. Deviate no more than 6 in outside of the 12 in walkway width.   Gait with Horizontal Head Turns Performs head turns smoothly with no change in gait. Deviates no more than 6 in outside 12 in walkway width   Gait with Vertical Head Turns Performs task with slight change in gait velocity (eg, minor disruption to smooth gait path), deviates 6 - 10 in outside 12 in walkway width or uses assistive device   Gait and Pivot Turn Pivot turns safely in greater than 3 sec and stops with no loss of balance, or pivot turns safely within 3 sec and stops with mild imbalance, requires small steps to  catch balance.   Step Over Obstacle Is able to step over 2 stacked shoe boxes taped together (9 in total height) without changing gait speed. No evidence of imbalance.   Gait with Narrow Base of Support Ambulates 7-9 steps.   Gait with Eyes Closed Walks 20 ft, uses assistive device, slower speed, mild gait deviations, deviates 6-10 in outside 12 in walkway width. Ambulates 20 ft in less than 9 sec but greater than 7 sec.   Ambulating Backwards Walks 20 ft, uses assistive device, slower speed, mild gait deviations, deviates 6-10 in outside 12 in walkway width.   Steps Alternating feet, must use rail.   Total Score 24   FGA comment: 19-24 = medium risk fall       Scores as of discharge from last PT episode - 09/07/2022: 5xSTS = 12.87 sec w/o UE assist TUG: Normal = 8.34 sec w/o AD; Manual = 9.16 sec; Cognitive = 14.06 sec : 6.44 sec w/o AD Gait speed: 5.09 ft/sec w/o AD Berg = 52/56; 52-55 lower fall risk (> 25%) FGA = 25/30; 25-28 = low risk fall   PATIENT SURVEYS:  ABC scale 980 / 1600 = 61.3 % (50-80% = moderate level of physical functioning); <69% indicates risk for recurrent falls in PD 05/22/23 - 1270 / 1600 = 79.4 %  Score as of discharge from last PT episode - 09/07/2022: ABC scale: 1170 / 1600 = 73.1 %    TODAY'S TREATMENT:   06/22/23 THERAPEUTIC EXERCISE: To improve strength and endurance.  Demonstration, verbal and tactile cues throughout for technique.  Rec Bike - L3 x 6 min  GAIT TRAINING: To normalize gait pattern, improve safety with hiking poles, and promote upright posture. 450' with single hiking pole on R - cues for reciprocal step pattern and increasing L arm swing 150' with B hiking poles - cues for more forward advancement of hiking poles to facilitate upright posture and increased reciprocal arm motion  NEUROMUSCULAR RE-EDUCATION: To improve balance, coordination, kinesthesia, posture, proprioception, reduce fall risk, amplitude of movement, speed of movement  to reduce bradykinesia, and reduce rigidity.  Standing PWR! Moves - Up, Rock, Twist and Step x 10 focusing on upright posture  PWR! Backwards step x 5 - limited by fatigue   06/19/23 THERAPEUTIC EXERCISE: To improve strength and endurance.  Demonstration, verbal and tactile cues throughout for technique.  Nustep L6 x 6 min - UE/LE to promote reciprocal movement patterns  PHYSICAL PERFORMANCE TEST or MEASUREMENT: FGA = 24/30; 19-24 indicates medium risk fall  Functional Gait  Assessment  Gait Level Surface Walks 20 ft in less than 5.5 sec, no assistive devices, good speed, no evidence for imbalance, normal gait pattern, deviates no more than 6 in outside of the 12 in walkway width.   Change in Gait Speed Able to smoothly change walking speed without loss of balance or gait deviation. Deviate no more than 6 in outside of the 12 in walkway width.   Gait with Horizontal Head Turns Performs head turns smoothly with no change in gait. Deviates no more than 6 in outside 12 in walkway width   Gait with Vertical Head Turns Performs task with slight change in gait velocity (eg, minor disruption to smooth gait path), deviates 6 - 10 in outside 12 in walkway width or uses assistive device   Gait and Pivot Turn Pivot turns safely in greater than 3 sec and stops with no loss of balance, or pivot turns safely within 3 sec and stops with mild imbalance, requires small steps to catch balance.   Step Over Obstacle Is able to step over 2 stacked shoe boxes taped together (9 in total height) without changing gait speed. No evidence of imbalance.   Gait with Narrow Base of Support Ambulates 7-9 steps.   Gait with Eyes Closed Walks 20 ft, uses assistive device, slower speed, mild gait deviations, deviates 6-10 in outside 12 in walkway width. Ambulates 20 ft in less than 9 sec but greater than 7 sec.   Ambulating Backwards Walks 20 ft, uses assistive device, slower speed, mild gait deviations, deviates 6-10 in outside 12  in walkway width.   Steps Alternating feet, must use rail.   Total Score 24   FGA comment: 19-24 = medium risk fall      THERAPEUTIC ACTIVITIES: To improve functional performance.  Demonstration, verbal and tactile cues throughout for technique. Sit to stand from low seat height w/o UE assist - working on rocking for momentum and "nose over toes" to facilitate fwd weight shift to help create lift for hips  GAIT TRAINING: To normalize gait pattern, improve safety with LRAD (hiking pole), and improve posture. 350' with single hiking pole on R - cues for reciprocal step pattern and increasing arm swing   06/05/23 THERAPEUTIC EXERCISE: To improve strength, endurance, ROM, and flexibility.  Demonstration, verbal and tactile cues throughout for technique.  Nustep L5 x 4 min UEs/LEs Supine hip adductor stretch with strap 2 x 30" bil-  lateral thigh supported on blue bolster or PT's leg Supine cross body ITB stretch with strap 2 x 30" bil Hooklying KTOS piriformis stretches 2 x 30" bil Hooklying TrA + alt GTB hip ABD/ER 10 x 3"  Bridge + GTB hip ABD isometric 10 x 3-5" Bridge + GTB hip ABD/ER clam 10 x 3-5" Hooklying TrA + GTB march 10 x 3"   NEUROMUSCULAR RE-EDUCATION: To improve balance, coordination, kinesthesia, posture, proprioception, and amplitude of movement. PWR! Sit to stand + GTB hip ABD isometric x 5   06/01/23  GAIT TRAINING: To normalize gait pattern and improve safety with LRAD on all surfaces.  800' with SPC on indoor and outdoor surfaces including inclines, grass and up/down  curb Stairs: Level of Assistance: Modified independence Stair Negotiation Technique: Alternating Pattern Forwards with and w/o use of AD: cane with Single Rail on Left Number of Stairs: 2 x 14  Height of Stairs: 7"   MANUAL THERAPY: To promote normalized muscle tension, improved flexibility, improved joint mobility, increased ROM, and reduced pain utilizing connective tissue massage, therapeutic  massage, manual TP therapy, and myofascial release. STM/DTM and manual TPR to R proximal hip adductors   THERAPEUTIC EXERCISE: To improve ROM and flexibility.  Demonstration, verbal and tactile cues throughout for technique.  Seated R hip adductor stretch x 30", x ~60" while using roller stick on proximal hip adductors Supine manual R hip adductor stretch x 30" Supine B hip adductor butterfly/frog leg stretch x 30" Supine manual R hip adductor stretch with strap 2 x 30" - 2nd rep with lateral thigh supported on blue bolster   05/29/23 THERAPEUTIC EXERCISE: To improve strength, endurance, ROM, and flexibility.  Demonstration, verbal and tactile cues throughout for technique.  NuStep - L5 x 6 minutes - UE/LE to promote reciprocal movement patterns Seated L hip hinge hamstring stretch x 30" Seated L adductor/hamstring stretch x 30" Supine L HS stretch with strap x 30" each in neutral and with hip ER & IR - best stretch with hip IR, therefore completed 2 additional reps x 30" Supine L ITB stretch with strap 3 x 30" Hooklying L KTOS piriformis stretch 2 x 30"  NEUROMUSCULAR RE-EDUCATION: To improve balance, coordination, kinesthesia, and proprioception. Standing alt hip ABD x 10 - UE support on rollator for balance Standing alt hip extension x 10 - UE support on rollator for balance Standing alt HS curls x 10 - UE support on rollator for balance Standing alt hip flexion + ER x 10 - UE support on rollator for balance   05/24/2023 THERAPEUTIC EXERCISE: Nustep L5 x 5 min UEs/LEs Standing lateral shift into wall x10 Hooklying piriformis stretch 2x 30" Supine ITB stretch with strap 2x30" Supine adductor/hamstring stretch with strap x60" Seated adductor/hamstring stretch x 30"  NEUROMUSCULAR RE-ED: Prone hip IR/ER to inhibit muscle activation x10 Seated hip IR/ER to inhibit muscle activation x10 Hip hinge 2x10 Backwards/fwds step and rock x10  MANUAL THERAPY STM & TPR Medial hamstring and  adductor proximal insertion along ischial tuberosities   05/22/2023 - 10th visit PN THERAPEUTIC EXERCISE: To improve strength, endurance, and flexibility.  Demonstration, verbal and tactile cues throughout for technique. Rec Bike - L1 x 6 min Hooklying L KTOS piriformis stretch 2 x 30" Hooklying L figure-4 piriformis stretch with slight overpressure x 30" - preferred latter stretch Hooklying L figure-4 piriformis to chest stretch x 30" each with B LE and single LE lift - preferred single leg version with good stretch reported Seated L KTOS piriformis stretch x 30" - preferred hooklying version Seated L hip hinge figure-4 piriformis stretch x 30" - preferred hooklying version  MANUAL THERAPY: To promote normalized muscle tension, improved flexibility, and reduced pain utilizing connective tissue massage, therapeutic massage, and manual TP therapy.  STM/DTM and manual TPR to L glutes and esp piriformis Provided instruction in self-STM techniques to L piriformis/glutes using tennis ball in sitting on firm chair or standing against wall.   THERAPEUTIC ACTIVITIES: To improve functional performance.  Demonstration, verbal and tactile cues throughout for technique. ABC scale: 1270 / 1600 = 79.4 %   PATIENT EDUCATION:  Education details: HEP update - piriformis stretches and self-STM techniques to L piriformis using tennis ball  Person educated: Patient  Education method: Explanation, Demonstration, Verbal cues, and Handouts Education comprehension: verbalized understanding, returned demonstration, verbal cues required, and needs further education  HOME EXERCISE PROGRAM: Access Code: [email or web address] URL: [email or web address] Date: 05/22/2023 Prepared by: Felecia Hopper  Exercises - Hip Flexor Stretch with Strap on Table  - 2 x daily - 7 x weekly - 3 reps - 30 sec hold - Supine Hamstring Stretch with Strap  - 2 x daily - 7 x weekly - 3 reps - 30 sec hold - Standing Lumbar Extension at  Wall - Forearms  - 2 x daily - 7 x weekly - 3 reps - 30 sec hold - Doorway Pec Stretch at 90 Degrees Abduction  - 2 x daily - 7 x weekly - 3 reps - 30 sec hold - Supine Piriformis Stretch with Foot on Ground (Mirrored)  - 2 x daily - 7 x weekly - 3 reps - 30 sec hold - Supine Figure 4 Piriformis Stretch with Leg Extension  - 2 x daily - 7 x weekly - 3 reps - 30 sec hold - Seated Piriformis Release With [employer] (Mirrored)  - 1-2 x daily - 7 x weekly - 1-2 min hold  PWR! Moves: Seated Standing   ASSESSMENT:  CLINICAL IMPRESSION: Dailey Buccheri" arrives to PT with newly purchased hiking poles.  Assisted him with set-up and adjustment for proper heigh to promote upright posture.  Worked on gait training with single and bilateral hiking poles emphasizing reciprocal patterns and increased forward advancement of poles to promote increased arm swing and upright posture.  Bill wanting to work further on his posture for gait, therefore reviewed standing PWR! Moves emphasizing upright posture.  Initiated backward step in standing, emphasizing PWR! Up on return to neutral stance but limited by fatigue.  Raenette Bumps will benefit from continued skilled PT to address ongoing strength, posture and motor control deficits to improve mobility and activity tolerance with decreased risk for falls.   OBJECTIVE IMPAIRMENTS: Abnormal gait, decreased activity tolerance, decreased balance, decreased coordination, decreased endurance, decreased knowledge of use of DME, decreased mobility, difficulty walking, decreased ROM, decreased strength, decreased safety awareness, dizziness, impaired perceived functional ability, impaired flexibility, improper body mechanics, postural dysfunction, and pain.   ACTIVITY LIMITATIONS: carrying, lifting, standing, squatting, stairs, transfers, bed mobility, reach over head, locomotion level, and caring for others   PARTICIPATION LIMITATIONS: meal prep, cleaning, laundry, driving, shopping,  and community activity   PERSONAL FACTORS: Age, Past/current experiences, Time since onset of injury/illness/exacerbation, and 3+ comorbidities: PPM 2 sick sinus syndrome/SA node dysfunction & complete heart block, orthostatic hypotension, COPD, OSA, HTN, DM2, Parkinson's Disease, CVA 03/2021, hypothyroidism, PTSD, bipolar I disorder   are also affecting patient's functional outcome.    REHAB POTENTIAL: Good   CLINICAL DECISION MAKING: Evolving/moderate complexity   EVALUATION COMPLEXITY: Moderate   GOALS: Goals reviewed with patient? Yes  SHORT TERM GOALS: Target date: 05/18/2023   Complete remaining standardized balance assessment and update LTG's accordingly Baseline: Berg and FGA TBA Goal status: MET - 04/18/23  Assess need for new rollator/4WW and provide recommendations to MD for DME prescription.  Baseline:  Goal status: MET - 05/22/23 - Pt reports he is pleased with his recent 4WW customization and does not feel need to order a new 4WW/rollator.  PT assessed modification and walker appears safe and sturdy.  Patient will be independent with initial HEP Baseline:  Goal status: MET - 05/10/23  Patient will demonstrate improved B proximal LE strength to >/= 4+/5 for improved stability and  ease of mobility. Baseline: Refer to above LE MMT table Goal status: MET - 05/18/23  LONG TERM GOALS: Target date: 06/29/2023  Patient will be independent with ongoing/advanced HEP for self-management at home incorporating PWR! Moves as indicated.  Baseline:  Goal status: PARTIALLY MET - 05/22/23 - Met for current HEP  2.  Patient will be able to ambulate at least 800 ft with LRAD including outdoor and unlevel surfaces, independently with no LOB, for improved community gait.   Baseline:  Goal status: PARTIALLY MET - 06/01/23 - independent with variable surfaces indoors and outdoors with [facility] but increased trunk flexion and limited by fatigue and L sciatica and R groin/inner thigh pain  3.  Patient  will be able to step up/down curb safely with LRAD for safety with community ambulation.  Baseline:  Goal status: MET - 06/01/23   4.  Patient will demonstrate at least 4 point improvement on FGA to improve gait stability and reduce risk for falls.  Baseline: 17/30 (04/18/23) 05/22/23 - deferred assessment today due to acute L sciatica Goal status: MET - 06/19/23 - 24/30  5.  Patient will improve Berg score by at least 8 points to improve safety and stability with ADLs in standing and reduce risk for falls.    Baseline: 44/56 (04/18/23) Goal status: IN PROGRESS - 05/15/23 - 49/56     6.  Patient will report >/= 70% on ABC scale to demonstrate improved balance confidence and decreased risk for falls. Baseline: 980 / 1600 = 61.3 %  Goal status: MET - 05/22/23 - 1270 / 1600 = 79.4 %  7. Patient will verbalize understanding of local Parkinson's disease community resources, including community fitness post d/c. Baseline:  Goal status: IN PROGRESS   PLAN:  PT FREQUENCY: 2x/week  PT DURATION: 8 weeks  PLANNED INTERVENTIONS: 97164- PT Re-evaluation, 97110-Therapeutic exercises, 97530- Therapeutic activity, 97112- Neuromuscular re-education, 97535- Self Care, 72536- Manual therapy, [phone]- Gait training, [phone]- Electrical stimulation (unattended), 97035- Ultrasound, 95638- Ionotophoresis 4mg /ml Dexamethasone, Patient/Family education, Balance training, Stair training, Taping, Dry Needling, Joint mobilization, DME instructions, Cryotherapy, Moist heat, and 97750- Physical Performance Test or Measurement  PLAN FOR NEXT SESSION: gait training with hiking pole(s) PRN, review/progress postural and proximal LE flexibility and core/proximal LE strengthening - update/modify HEP as indicated; review/progress PWR! Moves   [place], [place] 06/22/2023, 1:16 PM

## 2023-06-26 ENCOUNTER — Encounter: Payer: Self-pay | Admitting: Physical Therapy

## 2023-06-26 ENCOUNTER — Ambulatory Visit: Admitting: Physical Therapy

## 2023-06-26 DIAGNOSIS — R293 Abnormal posture: Secondary | ICD-10-CM

## 2023-06-26 DIAGNOSIS — R2689 Other abnormalities of gait and mobility: Secondary | ICD-10-CM

## 2023-06-26 DIAGNOSIS — M6281 Muscle weakness (generalized): Secondary | ICD-10-CM

## 2023-06-26 DIAGNOSIS — R2681 Unsteadiness on feet: Secondary | ICD-10-CM

## 2023-06-26 NOTE — Therapy (Signed)
 OUTPATIENT PHYSICAL THERAPY TREATMENT    Patient Name: Victor Osborne MRN: [id number] DOB:[birth year], 89 y.o., male Today's Date: 06/26/2023   END OF SESSION:  PT End of Session - 06/26/23 1105     Visit Number 17    Date for PT Re-Evaluation 06/29/23    Authorization Type Medicare & [employer]    Progress Note Due on Visit 20    PT Start Time 1105   Pt arrived late   PT Stop Time 1125    PT Time Calculation (min) 20 min    Activity Tolerance Patient tolerated treatment well    Behavior During Therapy WFL for tasks assessed/performed                    Past Medical History:  Diagnosis Date   Bipolar 1 disorder (HCC)    COPD (chronic obstructive pulmonary disease) (HCC)    Depression    First degree AV block    Hypertension    pt denies but was in medical record   Hypoglycemia, unspecified    Myalgia and myositis, unspecified    Obesity    Pneumonia 1970   Skin cancer (melanoma) (HCC)    Thyroid  disease    Unspecified hypothyroidism    UPJ obstruction, congenital    Past Surgical History:  Procedure Laterality Date   APPENDECTOMY  1939   malignant melanoma removed from right forehead  2008   PERMANENT PACEMAKER INSERTION N/A 06/19/2013   Procedure: PERMANENT PACEMAKER INSERTION;  Surgeon: Tammie Fall, MD;  Location: [facility] CATH LAB;  Service: Cardiovascular;  Laterality: N/A;   PPM GENERATOR CHANGEOUT N/A 09/28/2022   Procedure: PPM GENERATOR CHANGEOUT;  Surgeon: Tammie Fall, MD;  Location: [facility] INVASIVE CV LAB;  Service: Cardiovascular;  Laterality: N/A;   right big toe  surgery  2009   TRANSURETHRAL RESECTION OF PROSTATE  02/04/2011   Procedure: TRANSURETHRAL RESECTION OF THE PROSTATE (TURP);  Surgeon: Kristeen Peto, MD;  Location: [facility] ORS;  Service: Urology;  Laterality: N/A;   URETHRA SURGERY  2010   reconstruction   Patient Active Problem List   Diagnosis Date Noted   Hyperlipidemia 03/15/2022   TIA (transient ischemic attack) 03/14/2022    History of neoplasm 04/12/2021   Melanocytic nevi of trunk 04/12/2021   Other seborrheic keratosis 04/12/2021   Cerebral embolism with cerebral infarction 03/31/2021   Left leg weakness 03/30/2021   Chronic post-traumatic stress disorder 12/05/2017   Bipolar I disorder, current or most recent episode depressed, in partial remission (HCC) 12/05/2017   CHB (complete heart block) (HCC) 01/27/2017   HTN (hypertension) 01/27/2017   Hypothyroidism 10/11/2016   Parkinson disease (HCC) 09/08/2016   Chronic osteoarthritis 03/20/2015   Bipolar 1 disorder (HCC) 03/20/2015   Tremor 03/20/2015   Pacemaker 09/24/2013   SSS (sick sinus syndrome) (HCC) 06/19/2013   First degree AV block 04/09/2013   Allergic rhinitis 03/17/2013   Obstructive sleep apnea 12/18/2006   COPD mixed type (HCC) 12/18/2006   History of malignant melanoma of skin 12/18/2006   Diabetes mellitus (HCC) 12/18/2006    PCP: Adra Alanis, FNP   REFERRING PROVIDER: Shirline Dover, DO   REFERRING DIAG: G20.A1 (ICD-10-CM) - Parkinson's disease without dyskinesia or fluctuating manifestations (HCC)   THERAPY DIAG:  Other abnormalities of gait and mobility  Unsteadiness on feet  Abnormal posture  Muscle weakness (generalized)  RATIONALE FOR EVALUATION AND TREATMENT: Rehabilitation  ONSET DATE: Initial PD diagnosis >15 yrs ago, worsening symptoms over past  few months  NEXT MD VISIT: 08/24/2023   SUBJECTIVE:                                                                                                                                                                                                         SUBJECTIVE STATEMENT: Pt c/o feeling lightheaded on arrival today.  He states did not get much sleep last night and has not eaten anything this morning - did have coffee and took his pills with water .  He got a ride to PT today.  EVAL:  Pt reports he feels that he needs to use the rollator more now than  previously and notes more effort when tries to go without the walker. He would like to use a 4WW with bigger wheels - he found one that needed fixing up and he has been using this, but would prefer a new one.  He feels like his legs are getting weaker and his activity tolerance is decreasing.  As of our PD screen on 02/21/23, He noted he was starting to feel the effects of the Parkinson's more - dizziness upon coming to standing and feeling weaker. Standing for ADLs cause him more fatigue than they used to, requiring seated rest breaks. He noted increased difficulty with UB dressing when trying to don a jacket. Walking requires more effort and he tires more easily. Feels more unsteady on his feet.   Pt accompanied by: self  PAIN: Are you having pain? No  PERTINENT HISTORY:  PPM 2 sick sinus syndrome/SA node dysfunction & complete heart block, orthostatic hypotension, COPD, OSA, HTN, DM2, Parkinson's Disease, CVA 03/2021, hypothyroidism, PTSD, bipolar I disorder   PRECAUTIONS: Fall and ICD/Pacemaker  RED FLAGS: None  WEIGHT BEARING RESTRICTIONS: No  FALLS:  Has patient fallen in last 6 months? No  LIVING ENVIRONMENT: Lives with: lives alone and with his dog Lives in: Other Independent living facility (3rd floor) Stairs: Elevator Has following equipment at home: Single point cane, Environmental consultant - 4 wheeled, and hiking pole  OCCUPATION: Retired  PLOF: Independent and Leisure: being outdoors - walking his dog, and stays active most of the day  PATIENT GOALS: "To be better prepared for balance issues and be able to adjust to avoid falling."   OBJECTIVE: (objective measures completed at initial evaluation unless otherwise dated)  DIAGNOSTIC FINDINGS:  03/14/22 & 03/15/22 - CT Head w/o contrast:  IMPRESSION: 1. No evidence of acute intracranial abnormality. 2. Chronic small vessel ischemic disease and cerebral atrophy.  03/14/22 - CT Angio Head & Neck: IMPRESSION: 1. Negative CTA  for large  vessel occlusion or other emergent finding. 2. Mild for age atheromatous change about the carotid bifurcations and carotid siphons without hemodynamically significant stenosis.  COGNITION: Overall cognitive status: Within functional limits for tasks assessed   SENSATION: WFL Except neuropathy in his feet - most notable at night  COORDINATION: Gross motor WFL B UE/LE   POSTURE:  rounded shoulders, forward head, and flexed trunk   MUSCLE LENGTH: Hamstrings: mod tight B ITB: mild/mod tight B Piriformis: mild/mod tight B Hip flexors: mod/severe tight B Quads: mod tight B Heelcord: mod tight L, mild tight R   LOWER EXTREMITY ROM:    Grossly WFL with exceptions of limitation related to muscle tightness as above  LOWER EXTREMITY MMT:    MMT Right eval Left eval R 05/18/23 L 05/18/23  Hip flexion 4+ 4+ 5 5  Hip extension 4 ^ 4 ^ 4+ ^ 4+ ^  Hip abduction 4- 4- 4+ 4+  Hip adduction 4 4 4+ 4+  Hip internal rotation 4+ 4+ 5 5  Hip external rotation 4 4 4+ 4+  Knee flexion 5 5 5 5   Knee extension 4+ 5 5 5   Ankle dorsiflexion 5 4+ 5 5  Ankle plantarflexion      Ankle inversion      Ankle eversion      (Blank rows = not tested, ^ - limited ROM)  BED MOBILITY:  Sit to supine Modified independence Supine to sit Modified independence Rolling to Right Modified independence  TRANSFERS: Assistive device utilized: None  Sit to stand: Complete Independence Stand to sit: Complete Independence Chair to chair:   Floor:    GAIT: Distance walked: clinic distances Assistive device utilized: Single point cane Level of assistance: Modified independence Gait pattern: step through pattern, decreased arm swing- Right, decreased arm swing- Left, and trunk flexed Comments: Pt reports he has been using his 4WW/rollator more commonly, especially outdoors  STAIRS:  Level of Assistance:    Psychologist, counselling Technique:   Number of Stairs:    Height of Stairs:   Comments:   FUNCTIONAL TESTS:   5 times sit to stand: 13.69 sec w/o UE assist  Timed up and go (TUG): 10.78 sec (Normal), 10.09 sec (Manual), 12.72 sec (Cognitive - unable to complete cognitive task w/o multiple errors) 10 meter walk test: 9.00 sec w/o AD; Gait speed = 3.64 ft/sec  Berg Balance Scale: 44/56; 37-45 significant (>80%) risk for falls (04/18/23)  Dynamic Gait Index: 17/30; < 19 = high risk fall (04/18/23)  04/18/2023  Berg = 44/56; scores of 37-45 indicate a significant (>80%) risk for falls FGA = 17/30; score of <19/30 indicate a high risk for falls Berg Balance Test  Sit to Stand Able to stand without using hands and stabilize independently   Standing Unsupported Able to stand safely 2 minutes   Sitting with Back Unsupported but Feet Supported on Floor or Stool Able to sit safely and securely 2 minutes   Stand to Sit Sits safely with minimal use of hands   Transfers Able to transfer safely, definite need of hands   Standing Unsupported with Eyes Closed Able to stand 10 seconds with supervision   Standing Unsupported with Feet Together Able to place feet together independently and stand for 1 minute with supervision   From Standing, Reach Forward with Outstretched Arm Can reach forward >12 cm safely (5")   From Standing Position, Pick up Object from Floor Able to pick up shoe safely and easily   From Standing  Position, Turn to Look Behind Over each Shoulder Looks behind one side only/other side shows less weight shift   Turn 360 Degrees Able to turn 360 degrees safely but slowly   Standing Unsupported, Alternately Place Feet on Step/Stool Able to stand independently and safely and complete 8 steps in 20 seconds   Standing Unsupported, One Foot in Front Able to take small step independently and hold 30 seconds   Standing on One Leg Tries to lift leg/unable to hold 3 seconds but remains standing independently   Total Score 44   Berg comment: 37-45 significant risk for falls(>80%)   Functional Gait  Assessment   Gait Level Surface Walks 20 ft in less than 7 sec but greater than 5.5 sec, uses assistive device, slower speed, mild gait deviations, or deviates 6-10 in outside of the 12 in walkway width.   Change in Gait Speed Able to change speed, demonstrates mild gait deviations, deviates 6-10 in outside of the 12 in walkway width, or no gait deviations, unable to achieve a major change in velocity, or uses a change in velocity, or uses an assistive device.   Gait with Horizontal Head Turns Performs head turns smoothly with slight change in gait velocity (eg, minor disruption to smooth gait path), deviates 6-10 in outside 12 in walkway width, or uses an assistive device.   Gait with Vertical Head Turns Performs task with slight change in gait velocity (eg, minor disruption to smooth gait path), deviates 6 - 10 in outside 12 in walkway width or uses assistive device   Gait and Pivot Turn Pivot turns safely within 3 sec and stops quickly with no loss of balance.   Step Over Obstacle Is able to step over one shoe box (4.5 in total height) without changing gait speed. No evidence of imbalance.   Gait with Narrow Base of Support Ambulates less than 4 steps heel to toe or cannot perform without assistance.   Gait with Eyes Closed Walks 20 ft, slow speed, abnormal gait pattern, evidence for imbalance, deviates 10-15 in outside 12 in walkway width. Requires more than 9 sec to ambulate 20 ft.   Ambulating Backwards Walks 20 ft, slow speed, abnormal gait pattern, evidence for imbalance, deviates 10-15 in outside 12 in walkway width.   Steps Alternating feet, must use rail.   Total Score 17   FGA comment: < 19 = high risk fall    05/15/23: Berg = 44/56; scores of 46-51 indicate a moderate (>50%) risk for falls Berg Balance Test  Sit to Stand Able to stand without using hands and stabilize independently   Standing Unsupported Able to stand safely 2 minutes   Sitting with Back Unsupported but Feet Supported on Floor or  Stool Able to sit safely and securely 2 minutes   Stand to Sit Sits safely with minimal use of hands   Transfers Able to transfer safely, minor use of hands   Standing Unsupported with Eyes Closed Able to stand 10 seconds safely   Standing Unsupported with Feet Together Able to place feet together independently and stand for 1 minute with supervision   From Standing, Reach Forward with Outstretched Arm Can reach confidently >25 cm (10")   From Standing Position, Pick up Object from Floor Able to pick up shoe safely and easily   From Standing Position, Turn to Look Behind Over each Shoulder Looks behind one side only/other side shows less weight shift   Turn 360 Degrees Able to turn 360 degrees safely but  slowly   Standing Unsupported, Alternately Place Feet on Step/Stool Able to stand independently and safely and complete 8 steps in 20 seconds   Standing Unsupported, One Foot in Front Able to take small step independently and hold 30 seconds   Standing on One Leg Able to lift leg independently and hold 5-10 seconds   Total Score 49   Berg comment: 46-51 moderate risk for falls (>50%)    06/19/23: FGA = 24/30; 19-24 indicates medium risk fall  Functional Gait  Assessment  Gait Level Surface Walks 20 ft in less than 5.5 sec, no assistive devices, good speed, no evidence for imbalance, normal gait pattern, deviates no more than 6 in outside of the 12 in walkway width.   Change in Gait Speed Able to smoothly change walking speed without loss of balance or gait deviation. Deviate no more than 6 in outside of the 12 in walkway width.   Gait with Horizontal Head Turns Performs head turns smoothly with no change in gait. Deviates no more than 6 in outside 12 in walkway width   Gait with Vertical Head Turns Performs task with slight change in gait velocity (eg, minor disruption to smooth gait path), deviates 6 - 10 in outside 12 in walkway width or uses assistive device   Gait and Pivot Turn Pivot turns  safely in greater than 3 sec and stops with no loss of balance, or pivot turns safely within 3 sec and stops with mild imbalance, requires small steps to catch balance.   Step Over Obstacle Is able to step over 2 stacked shoe boxes taped together (9 in total height) without changing gait speed. No evidence of imbalance.   Gait with Narrow Base of Support Ambulates 7-9 steps.   Gait with Eyes Closed Walks 20 ft, uses assistive device, slower speed, mild gait deviations, deviates 6-10 in outside 12 in walkway width. Ambulates 20 ft in less than 9 sec but greater than 7 sec.   Ambulating Backwards Walks 20 ft, uses assistive device, slower speed, mild gait deviations, deviates 6-10 in outside 12 in walkway width.   Steps Alternating feet, must use rail.   Total Score 24   FGA comment: 19-24 = medium risk fall       Scores as of discharge from last PT episode - 09/07/2022: 5xSTS = 12.87 sec w/o UE assist TUG: Normal = 8.34 sec w/o AD; Manual = 9.16 sec; Cognitive = 14.06 sec : 6.44 sec w/o AD Gait speed: 5.09 ft/sec w/o AD Berg = 52/56; 52-55 lower fall risk (> 25%) FGA = 25/30; 25-28 = low risk fall   PATIENT SURVEYS:  ABC scale 980 / 1600 = 61.3 % (50-80% = moderate level of physical functioning); <69% indicates risk for recurrent falls in PD 05/22/23 - 1270 / 1600 = 79.4 %  Score as of discharge from last PT episode - 09/07/2022: ABC scale: 1170 / 1600 = 73.1 %    TODAY'S TREATMENT:   06/26/23 SELF CARE: Provided education on BP management and safe VS parameters for exercise to reduce fall risk.  VS assessment - pt c/o lightheaded upon arrival to PT: BP: 92/50 sitting HR: 60 bpm SpO2: 96%   Pt given 16 oz of water  to drink and VS reassessed after ~10 minutes: BP: 88/50 sitting HR: 60 bpm SpO2: 96%   Patient assisted to [employer] and taken down to the building entrance where he was assisted into his girlfriend's vehicle.  Encouraged patient to go get something to  eat and  increase his water /fluid intake.  If lightheadedness persists and BP remains low (he reports he has a BP cuff at home) he is to contact his PCP or go to ER.  Patient and girlfriend verbalized understanding.   06/22/23 THERAPEUTIC EXERCISE: To improve strength and endurance.  Demonstration, verbal and tactile cues throughout for technique.  Rec Bike - L3 x 6 min  GAIT TRAINING: To normalize gait pattern, improve safety with hiking poles, and promote upright posture. 450' with single hiking pole on R - cues for reciprocal step pattern and increasing L arm swing 150' with B hiking poles - cues for more forward advancement of hiking poles to facilitate upright posture and increased reciprocal arm motion  NEUROMUSCULAR RE-EDUCATION: To improve balance, coordination, kinesthesia, posture, proprioception, reduce fall risk, amplitude of movement, speed of movement to reduce bradykinesia, and reduce rigidity.  Standing PWR! Moves - Up, Rock, Twist and Step x 10 focusing on upright posture PWR! Backwards step x 5 - limited by fatigue   06/19/23 THERAPEUTIC EXERCISE: To improve strength and endurance.  Demonstration, verbal and tactile cues throughout for technique.  Nustep L6 x 6 min - UE/LE to promote reciprocal movement patterns  PHYSICAL PERFORMANCE TEST or MEASUREMENT: FGA = 24/30; 19-24 indicates medium risk fall  Functional Gait  Assessment  Gait Level Surface Walks 20 ft in less than 5.5 sec, no assistive devices, good speed, no evidence for imbalance, normal gait pattern, deviates no more than 6 in outside of the 12 in walkway width.   Change in Gait Speed Able to smoothly change walking speed without loss of balance or gait deviation. Deviate no more than 6 in outside of the 12 in walkway width.   Gait with Horizontal Head Turns Performs head turns smoothly with no change in gait. Deviates no more than 6 in outside 12 in walkway width   Gait with Vertical Head Turns Performs task with slight  change in gait velocity (eg, minor disruption to smooth gait path), deviates 6 - 10 in outside 12 in walkway width or uses assistive device   Gait and Pivot Turn Pivot turns safely in greater than 3 sec and stops with no loss of balance, or pivot turns safely within 3 sec and stops with mild imbalance, requires small steps to catch balance.   Step Over Obstacle Is able to step over 2 stacked shoe boxes taped together (9 in total height) without changing gait speed. No evidence of imbalance.   Gait with Narrow Base of Support Ambulates 7-9 steps.   Gait with Eyes Closed Walks 20 ft, uses assistive device, slower speed, mild gait deviations, deviates 6-10 in outside 12 in walkway width. Ambulates 20 ft in less than 9 sec but greater than 7 sec.   Ambulating Backwards Walks 20 ft, uses assistive device, slower speed, mild gait deviations, deviates 6-10 in outside 12 in walkway width.   Steps Alternating feet, must use rail.   Total Score 24   FGA comment: 19-24 = medium risk fall      THERAPEUTIC ACTIVITIES: To improve functional performance.  Demonstration, verbal and tactile cues throughout for technique. Sit to stand from low seat height w/o UE assist - working on rocking for momentum and "nose over toes" to facilitate fwd weight shift to help create lift for hips  GAIT TRAINING: To normalize gait pattern, improve safety with LRAD (hiking pole), and improve posture. 350' with single hiking pole on R - cues for reciprocal step pattern and  increasing arm swing   06/05/23 THERAPEUTIC EXERCISE: To improve strength, endurance, ROM, and flexibility.  Demonstration, verbal and tactile cues throughout for technique.  Nustep L5 x 4 min UEs/LEs Supine hip adductor stretch with strap 2 x 30" bil-  lateral thigh supported on blue bolster or PT's leg Supine cross body ITB stretch with strap 2 x 30" bil Hooklying KTOS piriformis stretches 2 x 30" bil Hooklying TrA + alt GTB hip ABD/ER 10 x 3"  Bridge + GTB  hip ABD isometric 10 x 3-5" Bridge + GTB hip ABD/ER clam 10 x 3-5" Hooklying TrA + GTB march 10 x 3"   NEUROMUSCULAR RE-EDUCATION: To improve balance, coordination, kinesthesia, posture, proprioception, and amplitude of movement. PWR! Sit to stand + GTB hip ABD isometric x 5   06/01/23  GAIT TRAINING: To normalize gait pattern and improve safety with LRAD on all surfaces.  800' with SPC on indoor and outdoor surfaces including inclines, grass and up/down curb Stairs: Level of Assistance: Modified independence Stair Negotiation Technique: Alternating Pattern Forwards with and w/o use of AD: cane with Single Rail on Left Number of Stairs: 2 x 14  Height of Stairs: 7"   MANUAL THERAPY: To promote normalized muscle tension, improved flexibility, improved joint mobility, increased ROM, and reduced pain utilizing connective tissue massage, therapeutic massage, manual TP therapy, and myofascial release. STM/DTM and manual TPR to R proximal hip adductors   THERAPEUTIC EXERCISE: To improve ROM and flexibility.  Demonstration, verbal and tactile cues throughout for technique.  Seated R hip adductor stretch x 30", x ~60" while using roller stick on proximal hip adductors Supine manual R hip adductor stretch x 30" Supine B hip adductor butterfly/frog leg stretch x 30" Supine manual R hip adductor stretch with strap 2 x 30" - 2nd rep with lateral thigh supported on blue bolster   05/29/23 THERAPEUTIC EXERCISE: To improve strength, endurance, ROM, and flexibility.  Demonstration, verbal and tactile cues throughout for technique.  NuStep - L5 x 6 minutes - UE/LE to promote reciprocal movement patterns Seated L hip hinge hamstring stretch x 30" Seated L adductor/hamstring stretch x 30" Supine L HS stretch with strap x 30" each in neutral and with hip ER & IR - best stretch with hip IR, therefore completed 2 additional reps x 30" Supine L ITB stretch with strap 3 x 30" Hooklying L KTOS piriformis  stretch 2 x 30"  NEUROMUSCULAR RE-EDUCATION: To improve balance, coordination, kinesthesia, and proprioception. Standing alt hip ABD x 10 - UE support on rollator for balance Standing alt hip extension x 10 - UE support on rollator for balance Standing alt HS curls x 10 - UE support on rollator for balance Standing alt hip flexion + ER x 10 - UE support on rollator for balance   05/24/2023 THERAPEUTIC EXERCISE: Nustep L5 x 5 min UEs/LEs Standing lateral shift into wall x10 Hooklying piriformis stretch 2x 30" Supine ITB stretch with strap 2x30" Supine adductor/hamstring stretch with strap x60" Seated adductor/hamstring stretch x 30"  NEUROMUSCULAR RE-ED: Prone hip IR/ER to inhibit muscle activation x10 Seated hip IR/ER to inhibit muscle activation x10 Hip hinge 2x10 Backwards/fwds step and rock x10  MANUAL THERAPY STM & TPR Medial hamstring and adductor proximal insertion along ischial tuberosities   05/22/2023 - 10th visit PN THERAPEUTIC EXERCISE: To improve strength, endurance, and flexibility.  Demonstration, verbal and tactile cues throughout for technique. Rec Bike - L1 x 6 min Hooklying L KTOS piriformis stretch 2 x 30" Hooklying L figure-4  piriformis stretch with slight overpressure x 30" - preferred latter stretch Hooklying L figure-4 piriformis to chest stretch x 30" each with B LE and single LE lift - preferred single leg version with good stretch reported Seated L KTOS piriformis stretch x 30" - preferred hooklying version Seated L hip hinge figure-4 piriformis stretch x 30" - preferred hooklying version  MANUAL THERAPY: To promote normalized muscle tension, improved flexibility, and reduced pain utilizing connective tissue massage, therapeutic massage, and manual TP therapy.  STM/DTM and manual TPR to L glutes and esp piriformis Provided instruction in self-STM techniques to L piriformis/glutes using tennis ball in sitting on firm chair or standing against wall.    THERAPEUTIC ACTIVITIES: To improve functional performance.  Demonstration, verbal and tactile cues throughout for technique. ABC scale: 1270 / 1600 = 79.4 %   PATIENT EDUCATION:  Education details: HEP update - piriformis stretches and self-STM techniques to L piriformis using tennis ball  Person educated: Patient Education method: Explanation, Demonstration, Verbal cues, and Handouts Education comprehension: verbalized understanding, returned demonstration, verbal cues required, and needs further education  HOME EXERCISE PROGRAM: Access Code: [email or web address] URL: [email or web address] Date: 05/22/2023 Prepared by: Felecia Hopper  Exercises - Hip Flexor Stretch with Strap on Table  - 2 x daily - 7 x weekly - 3 reps - 30 sec hold - Supine Hamstring Stretch with Strap  - 2 x daily - 7 x weekly - 3 reps - 30 sec hold - Standing Lumbar Extension at Wall - Forearms  - 2 x daily - 7 x weekly - 3 reps - 30 sec hold - Doorway Pec Stretch at 90 Degrees Abduction  - 2 x daily - 7 x weekly - 3 reps - 30 sec hold - Supine Piriformis Stretch with Foot on Ground (Mirrored)  - 2 x daily - 7 x weekly - 3 reps - 30 sec hold - Supine Figure 4 Piriformis Stretch with Leg Extension  - 2 x daily - 7 x weekly - 3 reps - 30 sec hold - Seated Piriformis Release With [employer] (Mirrored)  - 1-2 x daily - 7 x weekly - 1-2 min hold  PWR! Moves: Seated Standing   ASSESSMENT:  CLINICAL IMPRESSION: Thaer Miyoshi" arrives to PT c/o lightheadedness and poor night's sleep with no breakfast and limited fluids this am.  VS assessed and BP found to be symptomatically low.  No improvement observed after 16 oz of water , therefore deferred therapeutic exercises and activities.  Raenette Bumps was assisted his girlfriend's vehicle and encouraged patient to go get something to eat and increase his water /fluid intake (addiitional 8 oz bottle of water  provided).  If lightheadedness persists and BP remains low (he reports  he has a BP cuff at home) he is to contact his PCP.  Patient and girlfriend verbalized understanding.  Will plan to reassess vitals next visit.  Raenette Bumps was due to transition to HEP next visit, but will reassess readiness to proceed with transition to HEP vs need for recert.   OBJECTIVE IMPAIRMENTS: Abnormal gait, decreased activity tolerance, decreased balance, decreased coordination, decreased endurance, decreased knowledge of use of DME, decreased mobility, difficulty walking, decreased ROM, decreased strength, decreased safety awareness, dizziness, impaired perceived functional ability, impaired flexibility, improper body mechanics, postural dysfunction, and pain.   ACTIVITY LIMITATIONS: carrying, lifting, standing, squatting, stairs, transfers, bed mobility, reach over head, locomotion level, and caring for others   PARTICIPATION LIMITATIONS: meal prep, cleaning, laundry, driving, shopping, and community activity   PERSONAL FACTORS: Age, Past/current  experiences, Time since onset of injury/illness/exacerbation, and 3+ comorbidities: PPM 2 sick sinus syndrome/SA node dysfunction & complete heart block, orthostatic hypotension, COPD, OSA, HTN, DM2, Parkinson's Disease, CVA 03/2021, hypothyroidism, PTSD, bipolar I disorder   are also affecting patient's functional outcome.    REHAB POTENTIAL: Good   CLINICAL DECISION MAKING: Evolving/moderate complexity   EVALUATION COMPLEXITY: Moderate   GOALS: Goals reviewed with patient? Yes  SHORT TERM GOALS: Target date: 05/18/2023   Complete remaining standardized balance assessment and update LTG's accordingly Baseline: Berg and FGA TBA Goal status: MET - 04/18/23  Assess need for new rollator/4WW and provide recommendations to MD for DME prescription.  Baseline:  Goal status: MET - 05/22/23 - Pt reports he is pleased with his recent 4WW customization and does not feel need to order a new 4WW/rollator.  PT assessed modification and walker appears safe and  sturdy.  Patient will be independent with initial HEP Baseline:  Goal status: MET - 05/10/23  Patient will demonstrate improved B proximal LE strength to >/= 4+/5 for improved stability and ease of mobility. Baseline: Refer to above LE MMT table Goal status: MET - 05/18/23  LONG TERM GOALS: Target date: 06/29/2023  Patient will be independent with ongoing/advanced HEP for self-management at home incorporating PWR! Moves as indicated.  Baseline:  Goal status: PARTIALLY MET - 05/22/23 - Met for current HEP  2.  Patient will be able to ambulate at least 800 ft with LRAD including outdoor and unlevel surfaces, independently with no LOB, for improved community gait.   Baseline:  Goal status: PARTIALLY MET - 06/01/23 - independent with variable surfaces indoors and outdoors with [facility] but increased trunk flexion and limited by fatigue and L sciatica and R groin/inner thigh pain  3.  Patient will be able to step up/down curb safely with LRAD for safety with community ambulation.  Baseline:  Goal status: MET - 06/01/23   4.  Patient will demonstrate at least 4 point improvement on FGA to improve gait stability and reduce risk for falls.  Baseline: 17/30 (04/18/23) 05/22/23 - deferred assessment today due to acute L sciatica Goal status: MET - 06/19/23 - 24/30  5.  Patient will improve Berg score by at least 8 points to improve safety and stability with ADLs in standing and reduce risk for falls.    Baseline: 44/56 (04/18/23) Goal status: IN PROGRESS - 05/15/23 - 49/56     6.  Patient will report >/= 70% on ABC scale to demonstrate improved balance confidence and decreased risk for falls. Baseline: 980 / 1600 = 61.3 %  Goal status: MET - 05/22/23 - 1270 / 1600 = 79.4 %  7. Patient will verbalize understanding of local Parkinson's disease community resources, including community fitness post d/c. Baseline:  Goal status: IN PROGRESS   PLAN:  PT FREQUENCY: 2x/week  PT DURATION: 8 weeks  PLANNED  INTERVENTIONS: 97164- PT Re-evaluation, 97110-Therapeutic exercises, 97530- Therapeutic activity, 97112- Neuromuscular re-education, 97535- Self Care, 16109- Manual therapy, [phone]- Gait training, [phone]- Electrical stimulation (unattended), 97035- Ultrasound, 91478- Ionotophoresis 4mg /ml Dexamethasone, Patient/Family education, Balance training, Stair training, Taping, Dry Needling, Joint mobilization, DME instructions, Cryotherapy, Moist heat, and 97750- Physical Performance Test or Measurement  PLAN FOR NEXT SESSION: Reassess VS; goal assessment to determine readiness for transition to HEP vs need for recert    Francisco Irving, PT 06/26/2023, 11:47 AM

## 2023-06-29 ENCOUNTER — Encounter: Payer: Self-pay | Admitting: Physical Therapy

## 2023-06-29 ENCOUNTER — Ambulatory Visit: Admitting: Physical Therapy

## 2023-06-29 DIAGNOSIS — R293 Abnormal posture: Secondary | ICD-10-CM

## 2023-06-29 DIAGNOSIS — R2689 Other abnormalities of gait and mobility: Secondary | ICD-10-CM

## 2023-06-29 DIAGNOSIS — R2681 Unsteadiness on feet: Secondary | ICD-10-CM

## 2023-06-29 DIAGNOSIS — M6281 Muscle weakness (generalized): Secondary | ICD-10-CM

## 2023-06-29 NOTE — Therapy (Signed)
 OUTPATIENT PHYSICAL THERAPY TREATMENT / DISCHARGE SUMMARY    Patient Name: Victor Osborne MRN: [id number] DOB:[birth year], 89 y.o., male Today's Date: 06/29/2023   END OF SESSION:  PT End of Session - 06/29/23 1101     Visit Number 18    Date for PT Re-Evaluation 06/29/23    Authorization Type Medicare & [employer]    Progress Note Due on Visit 20    PT Start Time 1101    PT Stop Time 1151    PT Time Calculation (min) 50 min    Activity Tolerance Patient tolerated treatment well    Behavior During Therapy WFL for tasks assessed/performed                  Past Medical History:  Diagnosis Date   Bipolar 1 disorder (HCC)    COPD (chronic obstructive pulmonary disease) (HCC)    Depression    First degree AV block    Hypertension    pt denies but was in medical record   Hypoglycemia, unspecified    Myalgia and myositis, unspecified    Obesity    Pneumonia 1970   Skin cancer (melanoma) (HCC)    Thyroid  disease    Unspecified hypothyroidism    UPJ obstruction, congenital    Past Surgical History:  Procedure Laterality Date   APPENDECTOMY  1939   malignant melanoma removed from right forehead  2008   PERMANENT PACEMAKER INSERTION N/A 06/19/2013   Procedure: PERMANENT PACEMAKER INSERTION;  Surgeon: Tammie Fall, MD;  Location: [facility] CATH LAB;  Service: Cardiovascular;  Laterality: N/A;   PPM GENERATOR CHANGEOUT N/A 09/28/2022   Procedure: PPM GENERATOR CHANGEOUT;  Surgeon: Tammie Fall, MD;  Location: [facility] INVASIVE CV LAB;  Service: Cardiovascular;  Laterality: N/A;   right big toe  surgery  2009   TRANSURETHRAL RESECTION OF PROSTATE  02/04/2011   Procedure: TRANSURETHRAL RESECTION OF THE PROSTATE (TURP);  Surgeon: Kristeen Peto, MD;  Location: [facility] ORS;  Service: Urology;  Laterality: N/A;   URETHRA SURGERY  2010   reconstruction   Patient Active Problem List   Diagnosis Date Noted   Hyperlipidemia 03/15/2022   TIA (transient ischemic attack) 03/14/2022    History of neoplasm 04/12/2021   Melanocytic nevi of trunk 04/12/2021   Other seborrheic keratosis 04/12/2021   Cerebral embolism with cerebral infarction 03/31/2021   Left leg weakness 03/30/2021   Chronic post-traumatic stress disorder 12/05/2017   Bipolar I disorder, current or most recent episode depressed, in partial remission (HCC) 12/05/2017   CHB (complete heart block) (HCC) 01/27/2017   HTN (hypertension) 01/27/2017   Hypothyroidism 10/11/2016   Parkinson disease (HCC) 09/08/2016   Chronic osteoarthritis 03/20/2015   Bipolar 1 disorder (HCC) 03/20/2015   Tremor 03/20/2015   Pacemaker 09/24/2013   SSS (sick sinus syndrome) (HCC) 06/19/2013   First degree AV block 04/09/2013   Allergic rhinitis 03/17/2013   Obstructive sleep apnea 12/18/2006   COPD mixed type (HCC) 12/18/2006   History of malignant melanoma of skin 12/18/2006   Diabetes mellitus (HCC) 12/18/2006    PCP: Adra Alanis, FNP   REFERRING PROVIDER: Shirline Dover, DO   REFERRING DIAG: G20.A1 (ICD-10-CM) - Parkinson's disease without dyskinesia or fluctuating manifestations (HCC)   THERAPY DIAG:  Other abnormalities of gait and mobility  Unsteadiness on feet  Abnormal posture  Muscle weakness (generalized)  RATIONALE FOR EVALUATION AND TREATMENT: Rehabilitation  ONSET DATE: Initial PD diagnosis >15 yrs ago, worsening symptoms over past few months  NEXT MD VISIT: 08/24/2023   SUBJECTIVE:                                                                                                                                                                                                         SUBJECTIVE STATEMENT: Pt reports he is still not feeling quite like himself.  Sciatica feels like it has been been improving since he has been seeing the chiropractor.  EVAL:  Pt reports he feels that he needs to use the rollator more now than previously and notes more effort when tries to go without the walker.  He would like to use a 4WW with bigger wheels - he found one that needed fixing up and he has been using this, but would prefer a new one.  He feels like his legs are getting weaker and his activity tolerance is decreasing.  As of our PD screen on 02/21/23, He noted he was starting to feel the effects of the Parkinson's more - dizziness upon coming to standing and feeling weaker. Standing for ADLs cause him more fatigue than they used to, requiring seated rest breaks. He noted increased difficulty with UB dressing when trying to don a jacket. Walking requires more effort and he tires more easily. Feels more unsteady on his feet.   Pt accompanied by: self  PAIN: Are you having pain? No  PERTINENT HISTORY:  PPM 2 sick sinus syndrome/SA node dysfunction & complete heart block, orthostatic hypotension, COPD, OSA, HTN, DM2, Parkinson's Disease, CVA 03/2021, hypothyroidism, PTSD, bipolar I disorder   PRECAUTIONS: Fall and ICD/Pacemaker  RED FLAGS: None  WEIGHT BEARING RESTRICTIONS: No  FALLS:  Has patient fallen in last 6 months? No  LIVING ENVIRONMENT: Lives with: lives alone and with his dog Lives in: Other Independent living facility (3rd floor) Stairs: Elevator Has following equipment at home: Single point cane, Environmental consultant - 4 wheeled, and hiking pole  OCCUPATION: Retired  PLOF: Independent and Leisure: being outdoors - walking his dog, and stays active most of the day  PATIENT GOALS: To be better prepared for balance issues and be able to adjust to avoid falling.   OBJECTIVE: (objective measures completed at initial evaluation unless otherwise dated)  DIAGNOSTIC FINDINGS:  03/14/22 & 03/15/22 - CT Head w/o contrast:  IMPRESSION: 1. No evidence of acute intracranial abnormality. 2. Chronic small vessel ischemic disease and cerebral atrophy.  03/14/22 - CT Angio Head & Neck: IMPRESSION: 1. Negative CTA for large vessel occlusion or other emergent finding. 2. Mild for age  atheromatous change about the carotid bifurcations  and carotid siphons without hemodynamically significant stenosis.  COGNITION: Overall cognitive status: Within functional limits for tasks assessed   SENSATION: WFL Except neuropathy in his feet - most notable at night  COORDINATION: Gross motor WFL B UE/LE   POSTURE:  rounded shoulders, forward head, and flexed trunk   MUSCLE LENGTH: Hamstrings: mod tight B ITB: mild/mod tight B Piriformis: mild/mod tight B Hip flexors: mod/severe tight B Quads: mod tight B Heelcord: mod tight L, mild tight R   LOWER EXTREMITY ROM:    Grossly WFL with exceptions of limitation related to muscle tightness as above  LOWER EXTREMITY MMT:    MMT Right eval Left eval R 05/18/23 L 05/18/23  Hip flexion 4+ 4+ 5 5  Hip extension 4 ^ 4 ^ 4+ ^ 4+ ^  Hip abduction 4- 4- 4+ 4+  Hip adduction 4 4 4+ 4+  Hip internal rotation 4+ 4+ 5 5  Hip external rotation 4 4 4+ 4+  Knee flexion 5 5 5 5   Knee extension 4+ 5 5 5   Ankle dorsiflexion 5 4+ 5 5  Ankle plantarflexion      Ankle inversion      Ankle eversion      (Blank rows = not tested, ^ - limited ROM)  BED MOBILITY:  Sit to supine Modified independence Supine to sit Modified independence Rolling to Right Modified independence  TRANSFERS: Assistive device utilized: None  Sit to stand: Complete Independence Stand to sit: Complete Independence Chair to chair:   Floor:    GAIT: Distance walked: clinic distances Assistive device utilized: Single point cane Level of assistance: Modified independence Gait pattern: step through pattern, decreased arm swing- Right, decreased arm swing- Left, and trunk flexed Comments: Pt reports he has been using his 4WW/rollator more commonly, especially outdoors  STAIRS:  Level of Assistance:    Psychologist, counselling Technique:   Number of Stairs:    Height of Stairs:   Comments:   FUNCTIONAL TESTS:  5 times sit to stand: 13.69 sec w/o UE assist  Timed up  and go (TUG): 10.78 sec (Normal), 10.09 sec (Manual), 12.72 sec (Cognitive - unable to complete cognitive task w/o multiple errors) 10 meter walk test: 9.00 sec w/o AD; Gait speed = 3.64 ft/sec  Berg Balance Scale: 44/56; 37-45 significant (>80%) risk for falls (04/18/23)  Dynamic Gait Index: 17/30; < 19 = high risk fall (04/18/23)  04/18/2023  Berg = 44/56; scores of 37-45 indicate a significant (>80%) risk for falls FGA = 17/30; score of <19/30 indicate a high risk for falls Berg Balance Test  Sit to Stand Able to stand without using hands and stabilize independently   Standing Unsupported Able to stand safely 2 minutes   Sitting with Back Unsupported but Feet Supported on Floor or Stool Able to sit safely and securely 2 minutes   Stand to Sit Sits safely with minimal use of hands   Transfers Able to transfer safely, definite need of hands   Standing Unsupported with Eyes Closed Able to stand 10 seconds with supervision   Standing Unsupported with Feet Together Able to place feet together independently and stand for 1 minute with supervision   From Standing, Reach Forward with Outstretched Arm Can reach forward >12 cm safely (5)   From Standing Position, Pick up Object from Floor Able to pick up shoe safely and easily   From Standing Position, Turn to Look Behind Over each Shoulder Looks behind one side only/other side shows less weight shift  Turn 360 Degrees Able to turn 360 degrees safely but slowly   Standing Unsupported, Alternately Place Feet on Step/Stool Able to stand independently and safely and complete 8 steps in 20 seconds   Standing Unsupported, One Foot in Front Able to take small step independently and hold 30 seconds   Standing on One Leg Tries to lift leg/unable to hold 3 seconds but remains standing independently   Total Score 44   Berg comment: 37-45 significant risk for falls(>80%)   Functional Gait  Assessment  Gait Level Surface Walks 20 ft in less than 7 sec but greater  than 5.5 sec, uses assistive device, slower speed, mild gait deviations, or deviates 6-10 in outside of the 12 in walkway width.   Change in Gait Speed Able to change speed, demonstrates mild gait deviations, deviates 6-10 in outside of the 12 in walkway width, or no gait deviations, unable to achieve a major change in velocity, or uses a change in velocity, or uses an assistive device.   Gait with Horizontal Head Turns Performs head turns smoothly with slight change in gait velocity (eg, minor disruption to smooth gait path), deviates 6-10 in outside 12 in walkway width, or uses an assistive device.   Gait with Vertical Head Turns Performs task with slight change in gait velocity (eg, minor disruption to smooth gait path), deviates 6 - 10 in outside 12 in walkway width or uses assistive device   Gait and Pivot Turn Pivot turns safely within 3 sec and stops quickly with no loss of balance.   Step Over Obstacle Is able to step over one shoe box (4.5 in total height) without changing gait speed. No evidence of imbalance.   Gait with Narrow Base of Support Ambulates less than 4 steps heel to toe or cannot perform without assistance.   Gait with Eyes Closed Walks 20 ft, slow speed, abnormal gait pattern, evidence for imbalance, deviates 10-15 in outside 12 in walkway width. Requires more than 9 sec to ambulate 20 ft.   Ambulating Backwards Walks 20 ft, slow speed, abnormal gait pattern, evidence for imbalance, deviates 10-15 in outside 12 in walkway width.   Steps Alternating feet, must use rail.   Total Score 17   FGA comment: < 19 = high risk fall    05/15/23: Berg = 44/56; scores of 46-51 indicate a moderate (>50%) risk for falls  06/19/23: FGA = 24/30; 19-24 indicates medium risk fall  Functional Gait  Assessment  Gait Level Surface Walks 20 ft in less than 5.5 sec, no assistive devices, good speed, no evidence for imbalance, normal gait pattern, deviates no more than 6 in outside of the 12 in  walkway width.   Change in Gait Speed Able to smoothly change walking speed without loss of balance or gait deviation. Deviate no more than 6 in outside of the 12 in walkway width.   Gait with Horizontal Head Turns Performs head turns smoothly with no change in gait. Deviates no more than 6 in outside 12 in walkway width   Gait with Vertical Head Turns Performs task with slight change in gait velocity (eg, minor disruption to smooth gait path), deviates 6 - 10 in outside 12 in walkway width or uses assistive device   Gait and Pivot Turn Pivot turns safely in greater than 3 sec and stops with no loss of balance, or pivot turns safely within 3 sec and stops with mild imbalance, requires small steps to catch balance.   Step Over  Obstacle Is able to step over 2 stacked shoe boxes taped together (9 in total height) without changing gait speed. No evidence of imbalance.   Gait with Narrow Base of Support Ambulates 7-9 steps.   Gait with Eyes Closed Walks 20 ft, uses assistive device, slower speed, mild gait deviations, deviates 6-10 in outside 12 in walkway width. Ambulates 20 ft in less than 9 sec but greater than 7 sec.   Ambulating Backwards Walks 20 ft, uses assistive device, slower speed, mild gait deviations, deviates 6-10 in outside 12 in walkway width.   Steps Alternating feet, must use rail.   Total Score 24   FGA comment: 19-24 = medium risk fall      06/29/23: 5xSTS = 14.22 sec TUG): 10.62 sec (Normal), 11.21 sec (Manual), 13.25 sec (Cognitive - unable to complete cognitive task w/o multiple errors) = 8.50 sec w/o AD, 8.16 sec with hiking pole Gait speed = 3.86 ft/sec w/o AD, 4.02 ft/sec with hiking pole Berg = 51/56; 46-51 indicates moderate risk for falls (>50%)  Berg Balance Test  Sit to Stand Able to stand without using hands and stabilize independently   Standing Unsupported Able to stand safely 2 minutes   Sitting with Back Unsupported but Feet Supported on Floor or Stool Able  to sit safely and securely 2 minutes   Stand to Sit Sits safely with minimal use of hands   Transfers Able to transfer safely, minor use of hands   Standing Unsupported with Eyes Closed Able to stand 10 seconds safely   Standing Unsupported with Feet Together Able to place feet together independently and stand 1 minute safely   From Standing, Reach Forward with Outstretched Arm Can reach confidently >25 cm (10)   From Standing Position, Pick up Object from Floor Able to pick up shoe safely and easily   From Standing Position, Turn to Look Behind Over each Shoulder Looks behind one side only/other side shows less weight shift   Turn 360 Degrees Able to turn 360 degrees safely but slowly   Standing Unsupported, Alternately Place Feet on Step/Stool Able to stand independently and safely and complete 8 steps in 20 seconds   Standing Unsupported, One Foot in Front Able to plae foot ahead of the other independently and hold 30 seconds   Standing on One Leg Able to lift leg independently and hold 5-10 seconds   Total Score 51   Berg comment: 46-51 moderate risk for falls (>50%)     Scores as of discharge from last PT episode - 09/07/2022: 5xSTS = 12.87 sec w/o UE assist TUG: Normal = 8.34 sec w/o AD; Manual = 9.16 sec; Cognitive = 14.06 sec : 6.44 sec w/o AD Gait speed: 5.09 ft/sec w/o AD Berg = 52/56; 52-55 lower fall risk (> 25%) FGA = 25/30; 25-28 = low risk fall   PATIENT SURVEYS:  ABC scale 980 / 1600 = 61.3 % (50-80% = moderate level of physical functioning); <69% indicates risk for recurrent falls in PD 05/22/23 - 1270 / 1600 = 79.4 %  Score as of discharge from last PT episode - 09/07/2022: ABC scale: 1170 / 1600 = 73.1 %    TODAY'S TREATMENT:   06/29/23 VS assessment on arrival to PT (45 min early): BP: 120/62 sitting HR: 61 bpm SpO2: 98%   VS assessment at start of session: BP: 114/62 sitting HR: 60 bpm SpO2: 99%   THERAPEUTIC EXERCISE: To improve strength and  endurance.  Demonstration, verbal and tactile cues  throughout for technique. Nustep L5 x 5 min - UE/LE to promote reciprocal movement patterns  PHYSICAL PERFORMANCE TEST or MEASUREMENT: 5xSTS = 14.22 sec TUG): 10.62 sec (Normal), 11.21 sec (Manual), 13.25 sec (Cognitive - unable to complete cognitive task w/o multiple errors) = 8.50 sec w/o AD, 8.16 sec with hiking pole Gait speed = 3.86 ft/sec w/o AD, 4.02 ft/sec with hiking pole Berg = 51/56; 46-51 indicates moderate risk for falls (>50%)  Berg Balance Test  Sit to Stand Able to stand without using hands and stabilize independently   Standing Unsupported Able to stand safely 2 minutes   Sitting with Back Unsupported but Feet Supported on Floor or Stool Able to sit safely and securely 2 minutes   Stand to Sit Sits safely with minimal use of hands   Transfers Able to transfer safely, minor use of hands   Standing Unsupported with Eyes Closed Able to stand 10 seconds safely   Standing Unsupported with Feet Together Able to place feet together independently and stand 1 minute safely   From Standing, Reach Forward with Outstretched Arm Can reach confidently >25 cm (10)   From Standing Position, Pick up Object from Floor Able to pick up shoe safely and easily   From Standing Position, Turn to Look Behind Over each Shoulder Looks behind one side only/other side shows less weight shift   Turn 360 Degrees Able to turn 360 degrees safely but slowly   Standing Unsupported, Alternately Place Feet on Step/Stool Able to stand independently and safely and complete 8 steps in 20 seconds   Standing Unsupported, One Foot in Front Able to plae foot ahead of the other independently and hold 30 seconds   Standing on One Leg Able to lift leg independently and hold 5-10 seconds   Total Score 51   Berg comment: 46-51 moderate risk for falls (>50%)      SELF CARE: Provided education to prevent loss of gains achieved with physical therapy and to prevent  future decline in function.  Reviewed recommended frequency for ongoing HEP and PWR! Move performance, encouraging cycling through PWR! Moves in all positions over the course of the week. Reviewed education on community resources related to Parkinson's including support group and educational sessions, community-based PWR! Moves and exercise/movement groups as well as on line resources related to PD.     06/26/23 SELF CARE: Provided education on BP management and safe VS parameters for exercise to reduce fall risk.  VS assessment - pt c/o lightheaded upon arrival to PT: BP: 92/50 sitting HR: 60 bpm SpO2: 96%   Pt given 16 oz of water  to drink and VS reassessed after ~10 minutes: BP: 88/50 sitting HR: 60 bpm SpO2: 96%   Patient assisted to [employer] and taken down to the building entrance where he was assisted into his girlfriend's vehicle.  Encouraged patient to go get something to eat and increase his water /fluid intake.  If lightheadedness persists and BP remains low (he reports he has a BP cuff at home) he is to contact his PCP or go to ER.  Patient and girlfriend verbalized understanding.   06/22/23 THERAPEUTIC EXERCISE: To improve strength and endurance.  Demonstration, verbal and tactile cues throughout for technique.  Rec Bike - L3 x 6 min  GAIT TRAINING: To normalize gait pattern, improve safety with hiking poles, and promote upright posture. 450' with single hiking pole on R - cues for reciprocal step pattern and increasing L arm swing 150' with B hiking poles -  cues for more forward advancement of hiking poles to facilitate upright posture and increased reciprocal arm motion  NEUROMUSCULAR RE-EDUCATION: To improve balance, coordination, kinesthesia, posture, proprioception, reduce fall risk, amplitude of movement, speed of movement to reduce bradykinesia, and reduce rigidity.  Standing PWR! Moves - Up, Rock, Twist and Step x 10 focusing on upright posture PWR! Backwards step x 5  - limited by fatigue   06/19/23 THERAPEUTIC EXERCISE: To improve strength and endurance.  Demonstration, verbal and tactile cues throughout for technique.  Nustep L6 x 6 min - UE/LE to promote reciprocal movement patterns  PHYSICAL PERFORMANCE TEST or MEASUREMENT: FGA = 24/30; 19-24 indicates medium risk fall  Functional Gait  Assessment  Gait Level Surface Walks 20 ft in less than 5.5 sec, no assistive devices, good speed, no evidence for imbalance, normal gait pattern, deviates no more than 6 in outside of the 12 in walkway width.   Change in Gait Speed Able to smoothly change walking speed without loss of balance or gait deviation. Deviate no more than 6 in outside of the 12 in walkway width.   Gait with Horizontal Head Turns Performs head turns smoothly with no change in gait. Deviates no more than 6 in outside 12 in walkway width   Gait with Vertical Head Turns Performs task with slight change in gait velocity (eg, minor disruption to smooth gait path), deviates 6 - 10 in outside 12 in walkway width or uses assistive device   Gait and Pivot Turn Pivot turns safely in greater than 3 sec and stops with no loss of balance, or pivot turns safely within 3 sec and stops with mild imbalance, requires small steps to catch balance.   Step Over Obstacle Is able to step over 2 stacked shoe boxes taped together (9 in total height) without changing gait speed. No evidence of imbalance.   Gait with Narrow Base of Support Ambulates 7-9 steps.   Gait with Eyes Closed Walks 20 ft, uses assistive device, slower speed, mild gait deviations, deviates 6-10 in outside 12 in walkway width. Ambulates 20 ft in less than 9 sec but greater than 7 sec.   Ambulating Backwards Walks 20 ft, uses assistive device, slower speed, mild gait deviations, deviates 6-10 in outside 12 in walkway width.   Steps Alternating feet, must use rail.   Total Score 24   FGA comment: 19-24 = medium risk fall      THERAPEUTIC ACTIVITIES:  To improve functional performance.  Demonstration, verbal and tactile cues throughout for technique. Sit to stand from low seat height w/o UE assist - working on rocking for momentum and nose over toes to facilitate fwd weight shift to help create lift for hips  GAIT TRAINING: To normalize gait pattern, improve safety with LRAD (hiking pole), and improve posture. 350' with single hiking pole on R - cues for reciprocal step pattern and increasing arm swing   06/05/23 THERAPEUTIC EXERCISE: To improve strength, endurance, ROM, and flexibility.  Demonstration, verbal and tactile cues throughout for technique.  Nustep L5 x 4 min UEs/LEs Supine hip adductor stretch with strap 2 x 30 bil-  lateral thigh supported on blue bolster or PT's leg Supine cross body ITB stretch with strap 2 x 30 bil Hooklying KTOS piriformis stretches 2 x 30 bil Hooklying TrA + alt GTB hip ABD/ER 10 x 3  Bridge + GTB hip ABD isometric 10 x 3-5 Bridge + GTB hip ABD/ER clam 10 x 3-5 Hooklying TrA + GTB march 10 x  3   NEUROMUSCULAR RE-EDUCATION: To improve balance, coordination, kinesthesia, posture, proprioception, and amplitude of movement. PWR! Sit to stand + GTB hip ABD isometric x 5   06/01/23  GAIT TRAINING: To normalize gait pattern and improve safety with LRAD on all surfaces.  800' with SPC on indoor and outdoor surfaces including inclines, grass and up/down curb Stairs: Level of Assistance: Modified independence Stair Negotiation Technique: Alternating Pattern Forwards with and w/o use of AD: cane with Single Rail on Left Number of Stairs: 2 x 14  Height of Stairs: 7   MANUAL THERAPY: To promote normalized muscle tension, improved flexibility, improved joint mobility, increased ROM, and reduced pain utilizing connective tissue massage, therapeutic massage, manual TP therapy, and myofascial release. STM/DTM and manual TPR to R proximal hip adductors   THERAPEUTIC EXERCISE: To improve ROM and  flexibility.  Demonstration, verbal and tactile cues throughout for technique.  Seated R hip adductor stretch x 30, x ~60 while using roller stick on proximal hip adductors Supine manual R hip adductor stretch x 30 Supine B hip adductor butterfly/frog leg stretch x 30 Supine manual R hip adductor stretch with strap 2 x 30 - 2nd rep with lateral thigh supported on blue bolster   05/29/23 THERAPEUTIC EXERCISE: To improve strength, endurance, ROM, and flexibility.  Demonstration, verbal and tactile cues throughout for technique.  NuStep - L5 x 6 minutes - UE/LE to promote reciprocal movement patterns Seated L hip hinge hamstring stretch x 30 Seated L adductor/hamstring stretch x 30 Supine L HS stretch with strap x 30 each in neutral and with hip ER & IR - best stretch with hip IR, therefore completed 2 additional reps x 30 Supine L ITB stretch with strap 3 x 30 Hooklying L KTOS piriformis stretch 2 x 30  NEUROMUSCULAR RE-EDUCATION: To improve balance, coordination, kinesthesia, and proprioception. Standing alt hip ABD x 10 - UE support on rollator for balance Standing alt hip extension x 10 - UE support on rollator for balance Standing alt HS curls x 10 - UE support on rollator for balance Standing alt hip flexion + ER x 10 - UE support on rollator for balance   05/24/2023 THERAPEUTIC EXERCISE: Nustep L5 x 5 min UEs/LEs Standing lateral shift into wall x10 Hooklying piriformis stretch 2x 30 Supine ITB stretch with strap 2x30 Supine adductor/hamstring stretch with strap x60 Seated adductor/hamstring stretch x 30  NEUROMUSCULAR RE-ED: Prone hip IR/ER to inhibit muscle activation x10 Seated hip IR/ER to inhibit muscle activation x10 Hip hinge 2x10 Backwards/fwds step and rock x10  MANUAL THERAPY STM & TPR Medial hamstring and adductor proximal insertion along ischial tuberosities   05/22/2023 - 10th visit PN THERAPEUTIC EXERCISE: To improve strength, endurance, and  flexibility.  Demonstration, verbal and tactile cues throughout for technique. Rec Bike - L1 x 6 min Hooklying L KTOS piriformis stretch 2 x 30 Hooklying L figure-4 piriformis stretch with slight overpressure x 30 - preferred latter stretch Hooklying L figure-4 piriformis to chest stretch x 30 each with B LE and single LE lift - preferred single leg version with good stretch reported Seated L KTOS piriformis stretch x 30 - preferred hooklying version Seated L hip hinge figure-4 piriformis stretch x 30 - preferred hooklying version  MANUAL THERAPY: To promote normalized muscle tension, improved flexibility, and reduced pain utilizing connective tissue massage, therapeutic massage, and manual TP therapy.  STM/DTM and manual TPR to L glutes and esp piriformis Provided instruction in self-STM techniques to L piriformis/glutes using tennis ball  in sitting on firm chair or standing against wall.   THERAPEUTIC ACTIVITIES: To improve functional performance.  Demonstration, verbal and tactile cues throughout for technique. ABC scale: 1270 / 1600 = 79.4 %   PATIENT EDUCATION:  Education details: HEP update - piriformis stretches and self-STM techniques to L piriformis using tennis ball  Person educated: Patient Education method: Explanation, Demonstration, Verbal cues, and Handouts Education comprehension: verbalized understanding, returned demonstration, verbal cues required, and needs further education  HOME EXERCISE PROGRAM: Access Code: [email or web address] URL: [email or web address] Date: 05/22/2023 Prepared by: Felecia Hopper  Exercises - Hip Flexor Stretch with Strap on Table  - 2 x daily - 7 x weekly - 3 reps - 30 sec hold - Supine Hamstring Stretch with Strap  - 2 x daily - 7 x weekly - 3 reps - 30 sec hold - Standing Lumbar Extension at Wall - Forearms  - 2 x daily - 7 x weekly - 3 reps - 30 sec hold - Doorway Pec Stretch at 90 Degrees Abduction  - 2 x daily - 7 x weekly - 3 reps  - 30 sec hold - Supine Piriformis Stretch with Foot on Ground (Mirrored)  - 2 x daily - 7 x weekly - 3 reps - 30 sec hold - Supine Figure 4 Piriformis Stretch with Leg Extension  - 2 x daily - 7 x weekly - 3 reps - 30 sec hold - Seated Piriformis Release With [employer] (Mirrored)  - 1-2 x daily - 7 x weekly - 1-2 min hold  PWR! Moves: Seated Standing   ASSESSMENT:  CLINICAL IMPRESSION: Johnwesley Lederman arrives to PT feeling like he overdid it yesterday trying to change his car battery.  He he was not initially feeling lightheaded, but did note a little bit upon arriving to PT clinic.  VS assessed upon arrival and again at onset of PT session as patient had arrived 45 minutes early, full with BP, heart rate and SpO2 all WNL.  Completed remaining goal and standardized balance assessments with good gains noted across majority of testing.  Gait speed improved to 3.86 ft/sec without AD and 4.0 to ft/sec with hiking pole.  Berg and FGA within 1 point of previous discharge from PT. 5xSTS and TUG slightly decreased from eval, likely due to patient's report of overdoing things yesterday, but still within normal ranges.  We reviewed recommended frequency for ongoing HEP and PWR! Moves performance with patient denying need for further review today.  All PT goals now met or nearly met and Raenette Bumps feels ready to transition to his HEP, therefore will proceed with discharge from physical therapy for this episode.  OBJECTIVE IMPAIRMENTS: Abnormal gait, decreased activity tolerance, decreased balance, decreased coordination, decreased endurance, decreased knowledge of use of DME, decreased mobility, difficulty walking, decreased ROM, decreased strength, decreased safety awareness, dizziness, impaired perceived functional ability, impaired flexibility, improper body mechanics, postural dysfunction, and pain.   ACTIVITY LIMITATIONS: carrying, lifting, standing, squatting, stairs, transfers, bed mobility, reach over  head, locomotion level, and caring for others   PARTICIPATION LIMITATIONS: meal prep, cleaning, laundry, driving, shopping, and community activity   PERSONAL FACTORS: Age, Past/current experiences, Time since onset of injury/illness/exacerbation, and 3+ comorbidities: PPM 2 sick sinus syndrome/SA node dysfunction & complete heart block, orthostatic hypotension, COPD, OSA, HTN, DM2, Parkinson's Disease, CVA 03/2021, hypothyroidism, PTSD, bipolar I disorder   are also affecting patient's functional outcome.    REHAB POTENTIAL: Good   CLINICAL DECISION MAKING: Evolving/moderate complexity   EVALUATION COMPLEXITY: Moderate  GOALS: Goals reviewed with patient? Yes  SHORT TERM GOALS: Target date: 05/18/2023   Complete remaining standardized balance assessment and update LTG's accordingly Baseline: Berg and FGA TBA Goal status: MET - 04/18/23  Assess need for new rollator/4WW and provide recommendations to MD for DME prescription.  Baseline:  Goal status: MET - 05/22/23 - Pt reports he is pleased with his recent 4WW customization and does not feel need to order a new 4WW/rollator.  PT assessed modification and walker appears safe and sturdy.  Patient will be independent with initial HEP Baseline:  Goal status: MET - 05/10/23  Patient will demonstrate improved B proximal LE strength to >/= 4+/5 for improved stability and ease of mobility. Baseline: Refer to above LE MMT table Goal status: MET - 05/18/23  LONG TERM GOALS: Target date: 06/29/2023  Patient will be independent with ongoing/advanced HEP for self-management at home incorporating PWR! Moves as indicated.  Baseline:  Goal status: MET - 06/29/23  2.  Patient will be able to ambulate at least 800 ft with LRAD including outdoor and unlevel surfaces, independently with no LOB, for improved community gait.   Baseline:  06/01/23 - independent with variable surfaces indoors and outdoors with [facility] but increased trunk flexion and limited by  fatigue and L sciatica and R groin/inner thigh pain Goal status: MET - 06/29/23   3.  Patient will be able to step up/down curb safely with LRAD for safety with community ambulation.  Baseline:  Goal status: MET - 06/01/23   4.  Patient will demonstrate at least 4 point improvement on FGA to improve gait stability and reduce risk for falls.  Baseline: 17/30 (04/18/23) 05/22/23 - deferred assessment today due to acute L sciatica Goal status: MET - 06/19/23 - 24/30  5.  Patient will improve Berg score by at least 8 points to improve safety and stability with ADLs in standing and reduce risk for falls.    Baseline: 44/56 (04/18/23) 05/15/23 - 49/56 Goal status: PARTIALLY MET - 06/29/23 - 51/56  6.  Patient will report >/= 70% on ABC scale to demonstrate improved balance confidence and decreased risk for falls. Baseline: 980 / 1600 = 61.3 %  Goal status: MET - 05/22/23 - 1270 / 1600 = 79.4 %, 06/29/23 - 1260 / 1600 = 78.8 %  7. Patient will verbalize understanding of local Parkinson's disease community resources, including community fitness post d/c. Baseline:  Goal status: MET - 06/29/23   PLAN:  PT FREQUENCY: 2x/week  PT DURATION: 8 weeks  PLANNED INTERVENTIONS: 57846- PT Re-evaluation, 97110-Therapeutic exercises, 97530- Therapeutic activity, 97112- Neuromuscular re-education, 97535- Self Care, 96295- Manual therapy, [phone]- Gait training, [id number]- Electrical stimulation (unattended), 97035- Ultrasound, 02725- Ionotophoresis 4mg /ml Dexamethasone, Patient/Family education, Balance training, Stair training, Taping, Dry Needling, Joint mobilization, DME instructions, Cryotherapy, Moist heat, and 97750- Physical Performance Test or Measurement  PLAN FOR NEXT SESSION:  transition to HEP + D/C from PT   PHYSICAL THERAPY DISCHARGE SUMMARY  Visits from Start of Care: 18  Current functional level related to goals / functional outcomes: Refer to above clinical impression and goal assessment.    Remaining deficits: As above.    Education / Equipment: HEP, PWR! Moves  Patient agrees to discharge. Patient goals were mostly met. Patient is being discharged due to meeting the stated rehab goals.   Francisco Irving, PT 06/29/2023, 12:42 PM

## 2023-07-03 ENCOUNTER — Ambulatory Visit (INDEPENDENT_AMBULATORY_CARE_PROVIDER_SITE_OTHER): Payer: Medicare Other

## 2023-07-03 DIAGNOSIS — I495 Sick sinus syndrome: Secondary | ICD-10-CM

## 2023-07-03 DIAGNOSIS — I442 Atrioventricular block, complete: Secondary | ICD-10-CM

## 2023-07-03 LAB — CUP PACEART REMOTE DEVICE CHECK
Battery Remaining Longevity: 138 mo
Battery Remaining Percentage: 100 %
Brady Statistic RA Percent Paced: 91 %
Brady Statistic RV Percent Paced: 100 %
Date Time Interrogation Session: 20250616004000
Implantable Lead Connection Status: 753985
Implantable Lead Connection Status: 753985
Implantable Lead Implant Date: 20150603
Implantable Lead Implant Date: 20150603
Implantable Lead Location: 753859
Implantable Lead Location: 753860
Implantable Lead Model: 4136
Implantable Lead Model: 4137
Implantable Lead Serial Number: 29477746
Implantable Lead Serial Number: 29617326
Implantable Pulse Generator Implant Date: 20240911
Lead Channel Impedance Value: 539 Ohm
Lead Channel Impedance Value: 675 Ohm
Lead Channel Pacing Threshold Amplitude: 0.5 V
Lead Channel Pacing Threshold Amplitude: 2.2 V
Lead Channel Pacing Threshold Pulse Width: 0.4 ms
Lead Channel Pacing Threshold Pulse Width: 0.4 ms
Lead Channel Setting Pacing Amplitude: 2.5 V
Lead Channel Setting Pacing Amplitude: 3.5 V
Lead Channel Setting Pacing Pulse Width: 0.4 ms
Lead Channel Setting Sensing Sensitivity: 2.5 mV
Pulse Gen Serial Number: 664123
Zone Setting Status: 755011

## 2023-07-05 ENCOUNTER — Ambulatory Visit: Admitting: Physician Assistant

## 2023-07-05 ENCOUNTER — Encounter: Payer: Self-pay | Admitting: Physician Assistant

## 2023-07-05 DIAGNOSIS — F22 Delusional disorders: Secondary | ICD-10-CM | POA: Diagnosis not present

## 2023-07-05 DIAGNOSIS — G20A1 Parkinson's disease without dyskinesia, without mention of fluctuations: Secondary | ICD-10-CM

## 2023-07-05 DIAGNOSIS — F4312 Post-traumatic stress disorder, chronic: Secondary | ICD-10-CM | POA: Diagnosis not present

## 2023-07-05 DIAGNOSIS — F3175 Bipolar disorder, in partial remission, most recent episode depressed: Secondary | ICD-10-CM | POA: Diagnosis not present

## 2023-07-05 MED ORDER — QUETIAPINE FUMARATE ER 50 MG PO TB24
50.0000 mg | ORAL_TABLET | Freq: Every day | ORAL | 1 refills | Status: DC
Start: 2023-07-05 — End: 2023-08-07

## 2023-07-05 MED ORDER — CARBAMAZEPINE 200 MG PO TABS
200.0000 mg | ORAL_TABLET | Freq: Three times a day (TID) | ORAL | 1 refills | Status: DC
Start: 2023-07-05 — End: 2023-11-10

## 2023-07-05 NOTE — Progress Notes (Signed)
 [facility] Med Check  Patient ID: Victor Osborne,  MRN: [id number]  PCP: Adra Alanis, FNP  Date of Evaluation: 07/05/2023 Time spent:30 minutes  Chief Complaint:  Chief Complaint   Follow-up    HISTORY/CURRENT STATUS: HPI For 2 month med check  We added Seroquel  IR in addition to the XR to help with paranoia. States he's much better, at least 50%.  He's happy with the results and don't feel like anything needs to be changed. Energy and motivation are fairly good most of the time.   No extreme sadness, tearfulness, or feelings of hopelessness.  Sleeps well.  ADLs and personal hygiene are normal.  No change in memory.  Appetite has not changed.  Weight is stable.  No c/o anxiety, manic sx, psychosis, or AH/VH.  Denies SI/HI.   Denies dizziness, syncope, seizures, numbness, tingling, tremor, tics, unsteady gait, slurred speech, confusion. Denies muscle or joint pain, stiffness, or dystonia. Denies unexplained weight loss, frequent infections, or sores that heal slowly.  No polyphagia, polydipsia, or polyuria. Denies visual changes or paresthesias.   Individual Medical History/ Review of Systems: Changes? :No   Past Psychiatric Medication Trials: Carbamazepine  Lithium Prozac  Remeron Seroquel  XR Ambien   Allergies: Patient has no known allergies.  Current Medications:  Current Outpatient Medications:    acetaminophen  (TYLENOL ) 325 MG tablet, Take 650 mg by mouth every 6 (six) hours as needed for mild pain or headache., Disp: , Rfl:    alclomethasone (ACLOVATE) 0.05 % ointment, Apply 1 application topically 2 (two) times daily as needed (for rash)., Disp: , Rfl:    carbidopa -levodopa  (SINEMET  IR) 25-100 MG tablet, TAKE 2 TABLETS BY MOUTH THREE TIMES DAILY, Disp: 540 tablet, Rfl: 0   cholecalciferol  (VITAMIN D) 1000 UNITS tablet, Take 2,000 Units by mouth daily., Disp: , Rfl:    clopidogrel  (PLAVIX ) 75 MG tablet, Take 1 tablet (75 mg total) by mouth daily., Disp: 90  tablet, Rfl: 3   Coenzyme Q10 (CO Q 10 PO), Take 1 capsule by mouth daily., Disp: , Rfl:    ferrous sulfate  324 (65 Fe) MG TBEC, Take 324 mg by mouth daily., Disp: , Rfl:    Flaxseed, Linseed, (FLAX SEED OIL) 1300 MG CAPS, Take 1 capsule by mouth daily., Disp: , Rfl:    FLUoxetine  (PROZAC ) 10 MG capsule, Take 1 capsule (10 mg total) by mouth daily., Disp: 90 capsule, Rfl: 1   levothyroxine  (SYNTHROID ) 125 MCG tablet, TAKE 1 TABLET(125 MCG) BY MOUTH DAILY BEFORE BREAKFAST, Disp: 90 tablet, Rfl: 0   MAGNESIUM PO, Take 1 capsule by mouth daily., Disp: , Rfl:    Multiple Vitamin (MULITIVITAMIN WITH MINERALS) TABS, Take 1 tablet by mouth daily., Disp: , Rfl:    Omega-3 Fatty Acids (OMEGA-3 CF PO), Take 1,200 mg by mouth daily., Disp: , Rfl:    POTASSIUM GLUCONATE PO, Take 1 capsule by mouth daily., Disp: , Rfl:    pravastatin  (PRAVACHOL ) 80 MG tablet, Take 1 tablet (80 mg total) by mouth daily., Disp: 90 tablet, Rfl: 1   QUEtiapine  (SEROQUEL ) 25 MG tablet, Take 1 tablet (25 mg total) by mouth at bedtime. Take with Seroquel  XR 50 mg., Disp: 90 tablet, Rfl: 1   tamsulosin  (FLOMAX ) 0.4 MG CAPS capsule, Take 0.4 mg by mouth at bedtime., Disp: , Rfl:    carbamazepine  (TEGRETOL ) 200 MG tablet, Take 1 tablet (200 mg total) by mouth 3 (three) times daily., Disp: 270 tablet, Rfl: 1   Fluticasone -Umeclidin-Vilant (TRELEGY ELLIPTA ) 100-62.5-25 MCG/ACT AEPB, Inhale 1 puff into  the lungs daily. (Patient not taking: Reported on 07/05/2023), Disp: 1 each, Rfl: 11   QUEtiapine  (SEROQUEL  XR) 50 MG TB24 24 hr tablet, Take 1 tablet (50 mg total) by mouth at bedtime., Disp: 90 tablet, Rfl: 1 Medication Side Effects: none  Family Medical/ Social History: Changes? No  MENTAL HEALTH EXAM:  There were no vitals taken for this visit.There is no height or weight on file to calculate BMI.  General Appearance: Casual, Well Groomed, and vitiligo left forearm  Eye Contact:  Good  Speech:  Clear and Coherent and Normal Rate   Volume:  Normal  Mood:  Euthymic  Affect:  Congruent  Thought Process:  Goal Directed and Descriptions of Associations: Circumstantial  Orientation:  Full (Time, Place, and Person)  Thought Content: Logical   Suicidal Thoughts:  No  Homicidal Thoughts:  No  Memory:  WNL  Judgement:  Good  Insight:  Good  Psychomotor Activity:  walks w/ a cane, mild fine motor rhythmical tremor in right hand  Concentration:  Concentration: Good and Attention Span: Good  Recall:  Good  Fund of Knowledge: Good  Language: Good  Assets:  Communication Skills Desire for Improvement Financial Resources/Insurance Housing Resilience Transportation  ADL's:  Intact  Cognition: WNL  Prognosis:  Good   DIAGNOSES:    ICD-10-CM   1. Paranoia (HCC)  F22     2. Bipolar I disorder, current or most recent episode depressed, in partial remission (HCC)  F31.75 carbamazepine  (TEGRETOL ) 200 MG tablet    QUEtiapine  (SEROQUEL  XR) 50 MG TB24 24 hr tablet    3. Chronic post-traumatic stress disorder  F43.12     4. Parkinson's disease, unspecified whether dyskinesia present, unspecified whether manifestations fluctuate (HCC)  G20.A1       Receiving Psychotherapy: No   RECOMMENDATIONS:   PDMP reviewed.  No controlled substances. I provided 20 minutes of face to face time during this encounter, including time spent before and after the visit in records review, medical decision making, counseling pertinent to today's visit, and charting.   Raenette Bumps has responded well to the additional Seroquel .  No changes will be made.  Continue Tegretol  200 mg, 1 p.o. 3 times daily. Continue Prozac  10 mg, 1 p.o. daily. Continue Seroquel  XR 50 mg, 1 p.o. nightly. Cont Seroquel  IR 25 mg, 1 p.o. nightly. Return in 6 months.  Marvia Slocumb, PA-C

## 2023-07-06 ENCOUNTER — Ambulatory Visit: Payer: Self-pay | Admitting: Internal Medicine

## 2023-07-12 ENCOUNTER — Telehealth: Payer: Self-pay | Admitting: Neurology

## 2023-07-12 ENCOUNTER — Other Ambulatory Visit: Payer: Self-pay

## 2023-07-12 ENCOUNTER — Other Ambulatory Visit: Payer: Self-pay | Admitting: Neurology

## 2023-07-12 DIAGNOSIS — G20A1 Parkinson's disease without dyskinesia, without mention of fluctuations: Secondary | ICD-10-CM

## 2023-07-12 MED ORDER — CARBIDOPA-LEVODOPA 25-100 MG PO TABS: 2.0000 | ORAL_TABLET | Freq: Three times a day (TID) | ORAL | 0 refills | Status: DC

## 2023-07-12 NOTE — Telephone Encounter (Signed)
 Pt needs refill of Rx carbidopa -levodopa  sent to [id number] - [place], [place] - [address]

## 2023-07-17 ENCOUNTER — Other Ambulatory Visit: Payer: Self-pay | Admitting: Family

## 2023-07-17 NOTE — Telephone Encounter (Unsigned)
 Copied from CRM [id number]. Topic: Clinical - Medication Refill >> Jul 17, 2023 11:28 AM Shereese L wrote: Medication: levothyroxine  (SYNTHROID ) 125 MCG tablet  Has the patient contacted their pharmacy? Yes (Agent: If no, request that the patient contact the pharmacy for the refill. If patient does not wish to contact the pharmacy document the reason why and proceed with request.) (Agent: If yes, when and what did the pharmacy advise?)  This is the patient's preferred pharmacy:  [id number] [place], [place] - [address], [address] AT [facility] [address] [place] [address] Phone: [phone] Fax: [phone]  Is this the correct pharmacy for this prescription? Yes If no, delete pharmacy and type the correct one.   Has the prescription been filled recently? Yes  Is the patient out of the medication? Yes  Has the patient been seen for an appointment in the last year OR does the patient have an upcoming appointment? Yes  Can we respond through MyChart? Yes   Agent: Please be advised that Rx refills may take up to 3 business days. We ask that you follow-up with your pharmacy.

## 2023-08-07 ENCOUNTER — Other Ambulatory Visit: Payer: Self-pay

## 2023-08-07 ENCOUNTER — Telehealth: Payer: Self-pay | Admitting: Physician Assistant

## 2023-08-07 DIAGNOSIS — F3175 Bipolar disorder, in partial remission, most recent episode depressed: Secondary | ICD-10-CM

## 2023-08-07 MED ORDER — QUETIAPINE FUMARATE ER 50 MG PO TB24: 50.0000 mg | ORAL_TABLET | Freq: Every day | ORAL | 1 refills | Status: DC

## 2023-08-07 MED ORDER — QUETIAPINE FUMARATE 25 MG PO TABS: 25.0000 mg | ORAL_TABLET | Freq: Every day | ORAL | 1 refills | Status: DC

## 2023-08-07 NOTE — Telephone Encounter (Signed)
 [facility] [address] and needs both Seroquel  scripts sent there.

## 2023-08-07 NOTE — Telephone Encounter (Signed)
 Both doses of [facility]eroquel  [facility] [address].

## 2023-08-09 ENCOUNTER — Other Ambulatory Visit: Payer: Self-pay | Admitting: Family

## 2023-08-09 DIAGNOSIS — F3175 Bipolar disorder, in partial remission, most recent episode depressed: Secondary | ICD-10-CM

## 2023-08-09 NOTE — Telephone Encounter (Signed)
 Copied from CRM #[id number]. Topic: Clinical - Medication Refill >> Aug 09, 2023  1:33 PM Macario HERO wrote: Medication: pravastatin  (PRAVACHOL ) 80 MG tablet [[id number]]  Has the patient contacted their pharmacy? Yes (Agent: If no, request that the patient contact the pharmacy for the refill. If patient does not wish to contact the pharmacy document the reason why and proceed with request.) (Agent: If yes, when and what did the pharmacy advise?) Pharmacy said they sent a request   This is the patient's preferred pharmacy:  [id number] [place], [place] - [address], [address] AT [facility] [address] [place]  [address] Phone: [phone] Fax: [phone]  Is this the correct pharmacy for this prescription? Yes If no, delete pharmacy and type the correct one.   Has the prescription been filled recently? Yes  Is the patient out of the medication? Yes  Has the patient been seen for an appointment in the last year OR does the patient have an upcoming appointment? Yes  Can we respond through MyChart? No  Agent: Please be advised that Rx refills may take up to 3 business days. We ask that you follow-up with your pharmacy.

## 2023-08-10 NOTE — Progress Notes (Signed)
 Remote pacemaker transmission.

## 2023-08-15 ENCOUNTER — Other Ambulatory Visit: Payer: Self-pay | Admitting: Family

## 2023-08-15 ENCOUNTER — Other Ambulatory Visit: Payer: Self-pay | Admitting: Physician Assistant

## 2023-08-15 ENCOUNTER — Telehealth: Payer: Self-pay

## 2023-08-15 DIAGNOSIS — F4312 Post-traumatic stress disorder, chronic: Secondary | ICD-10-CM

## 2023-08-15 DIAGNOSIS — F3175 Bipolar disorder, in partial remission, most recent episode depressed: Secondary | ICD-10-CM

## 2023-08-15 MED ORDER — PRAVASTATIN SODIUM 80 MG PO TABS: 80.0000 mg | ORAL_TABLET | Freq: Every day | ORAL | 0 refills | Status: DC

## 2023-08-15 MED ORDER — LEVOTHYROXINE SODIUM 125 MCG PO TABS: 125.0000 ug | ORAL_TABLET | Freq: Every day | ORAL | 0 refills | Status: DC

## 2023-08-15 NOTE — Telephone Encounter (Signed)
 Copied from CRM [id number]. Topic: Clinical - Prescription Issue >> Aug 15, 2023  3:20 PM Mesmerise C wrote: Reason for CRM: Patient called last week for a refill of his pravastatin  (PRAVACHOL ) 80 MG tablet and hasn't gotten any response back for an update on if it's called in

## 2023-08-15 NOTE — Telephone Encounter (Signed)
 Copied from CRM [id number]. Topic: Clinical - Medication Refill >> Aug 15, 2023  3:15 PM Mesmerise C wrote: Medication:  levothyroxine  (SYNTHROID ) 125 MCG tablet   Has the patient contacted their pharmacy? No (Agent: If no, request that the patient contact the pharmacy for the refill. If patient does not wish to contact the pharmacy document the reason why and proceed with request.) (Agent: If yes, when and what did the pharmacy advise?)  This is the patient's preferred pharmacy:  [id number] [place], [place] - [address], [address] AT [facility] [address] [place]  [address] Phone: [phone] Fax: [phone]   Is this the correct pharmacy for this prescription? Yes If no, delete pharmacy and type the correct one.   Has the prescription been filled recently? No  Is the patient out of the medication? No  Has the patient been seen for an appointment in the last year OR does the patient have an upcoming appointment? Yes  Can we respond through MyChart? Yes  Agent: Please be advised that Rx refills may take up to 3 business days. We ask that you follow-up with your pharmacy.

## 2023-08-22 NOTE — Progress Notes (Unsigned)
 Assessment/Plan:   1.  Parkinsons Disease  -Continue carbidopa /levodopa  25/100, 2 tablets 3 times per day  2.  History of well-controlled bipolar  -[facility]  On Tegretol  200 mg tid and Seroquel  XR, 50 mg at bedtime  3.  History of melanoma  -Follows with Dr. Robinson  -Knows that Parkinson's slightly increases risk for future melanomas as well.  4.  Possible ACA infarct March, 2023 and another possible TIA in February, 2024  -Could not be confirmed because patients pacemaker is not MRI compatible.  -CT did not demonstrate acute infarct on either admission  -Patient has completed his 3 weeks of Plavix /aspirin  together.  Will now go to Plavix  monotherapy.  Nurse practitioner refilled his Plavix  for 1 year.    -Now on Pravachol , 80 mg daily  5.  Orthostatic hypotension  -Unrelated to Parkinson's.  His Parkinson's is far too mild, and really has not shown significant progression over the years.  -Patient off of midodrine  and doing well.    Subjective:   Victor Osborne was seen today in follow up for Parkinsons disease and more recent hospital f/u for possible stroke and orthostasis. My previous records were reviewed prior to todays visit as well as outside records available to me.  Patient is doing fairly well.  He has not had falls.  He has attended physical therapy, which finished in mid June.  He tolerates his levodopa  well.  He has no hallucinations.  No compulsive behaviors.  Current prescribed movement disorder medications: Carbidopa /levodopa  25/100, 2 tablets 3 times per day Pravachol , 80 mg daily  ALLERGIES:  No Known Allergies  CURRENT MEDICATIONS:  Outpatient Encounter Medications as of 08/24/2023  Medication Sig   acetaminophen  (TYLENOL ) 325 MG tablet Take 650 mg by mouth every 6 (six) hours as needed for mild pain or headache.   alclomethasone (ACLOVATE) 0.05 % ointment Apply 1 application topically 2 (two) times daily as needed (for  rash).   carbamazepine  (TEGRETOL ) 200 MG tablet Take 1 tablet (200 mg total) by mouth 3 (three) times daily.   carbidopa -levodopa  (SINEMET  IR) 25-100 MG tablet Take 2 tablets by mouth 3 (three) times daily.   cholecalciferol  (VITAMIN D) 1000 UNITS tablet Take 2,000 Units by mouth daily.   clopidogrel  (PLAVIX ) 75 MG tablet Take 1 tablet (75 mg total) by mouth daily.   Coenzyme Q10 (CO Q 10 PO) Take 1 capsule by mouth daily.   ferrous sulfate  324 (65 Fe) MG TBEC Take 324 mg by mouth daily.   Flaxseed, Linseed, (FLAX SEED OIL) 1300 MG CAPS Take 1 capsule by mouth daily.   FLUoxetine  (PROZAC ) 10 MG capsule Take 1 capsule (10 mg total) by mouth daily.   Fluticasone -Umeclidin-Vilant (TRELEGY ELLIPTA ) 100-62.5-25 MCG/ACT AEPB Inhale 1 puff into the lungs daily. (Patient not taking: Reported on 07/05/2023)   levothyroxine  (SYNTHROID ) 125 MCG tablet Take 1 tablet (125 mcg total) by mouth daily before breakfast. OV required for further refills   MAGNESIUM PO Take 1 capsule by mouth daily.   Multiple Vitamin (MULITIVITAMIN WITH MINERALS) TABS Take 1 tablet by mouth daily.   Omega-3 Fatty Acids (OMEGA-3 CF PO) Take 1,200 mg by mouth daily.   POTASSIUM GLUCONATE PO Take 1 capsule by mouth daily.   pravastatin  (PRAVACHOL ) 80 MG tablet Take 1 tablet (80 mg total) by mouth daily.   QUEtiapine  (SEROQUEL  XR) 50 MG TB24 24 hr tablet Take 1 tablet (50 mg total) by mouth at bedtime.   QUEtiapine  (SEROQUEL ) 25  MG tablet Take 1 tablet (25 mg total) by mouth at bedtime. Take with Seroquel  XR 50 mg.   tamsulosin  (FLOMAX ) 0.4 MG CAPS capsule Take 0.4 mg by mouth at bedtime.   No facility-administered encounter medications on file as of 08/24/2023.    Objective:   PHYSICAL EXAMINATION:    VITALS:   There were no vitals filed for this visit.   GEN:  The patient appears stated age and is in NAD.  He is cheerful and joking. HEENT:  Normocephalic, atraumatic.  The mucous membranes are moist. The superficial temporal  arteries are without ropiness or tenderness. CV:  RRR Lungs:  CTAB Neck/HEME:  There are no carotid bruits bilaterally.  Neurological examination:  Orientation: The patient is alert and oriented x3. Cranial nerves: There is good facial symmetry with minimal facial hypomimia. The speech is fluent and clear. Soft palate rises symmetrically and there is no tongue deviation. Hearing is intact to conversational tone. Sensation: Sensation is intact to light touch throughout Motor: Strength is at least antigravity x4.  Movement examination: Tone: there is mild increased tone in the RUE (has not taken his Parkinsons Disease med today) Abnormal movements: there is mouth/oral dyskinesia today (new for pt but he states he got bottom dentures 2 weeks ago); tongue stays in mouth; rare tremor on the RUE with rest Coordination:  There is  decremation with RAM's, only with toe taps on the L (same as last visit) Gait and Station: The patient pushes off to arise.  He is just a bit wide based but does pretty well in the wide open hall  I have reviewed and interpreted the following labs independently    Chemistry      Component Value Date/Time   NA 145 03/03/2023 1508   NA 146 (H) 09/12/2022 0000   K 4.5 03/03/2023 1508   CL 107 03/03/2023 1508   CO2 26 03/03/2023 1508   BUN 25 03/03/2023 1508   BUN 27 09/12/2022 0000   CREATININE 1.18 03/03/2023 1508      Component Value Date/Time   CALCIUM  9.3 03/03/2023 1508   ALKPHOS 81 03/14/2022 2040   AST 15 03/03/2023 1508   ALT 8 (L) 03/03/2023 1508   BILITOT 0.4 03/03/2023 1508   BILITOT 0.3 01/16/2019 0838       Lab Results  Component Value Date   WBC 5.1 03/03/2023   HGB 13.7 03/03/2023   HCT 41.0 03/03/2023   MCV 96.7 03/03/2023   PLT 203 03/03/2023    Lab Results  Component Value Date   TSH 4.142 03/15/2022   Lab Results  Component Value Date   CHOL 159 03/15/2022   HDL 75 03/15/2022   LDLCALC 72 03/15/2022   TRIG 59 03/15/2022    CHOLHDL 2.1 03/15/2022   Lab Results  Component Value Date   CBMZ 8.2 03/03/2023     Total time spent on today's visit was *** minutes, including both face-to-face time and nonface-to-face time.  Time included that spent on review of records (prior notes available to me/labs/imaging if pertinent), discussing treatment and goals, answering patient's questions and coordinating care.   Cc:  Jason Leita Repine, FNP

## 2023-08-24 ENCOUNTER — Encounter: Payer: Self-pay | Admitting: Neurology

## 2023-08-24 ENCOUNTER — Ambulatory Visit (INDEPENDENT_AMBULATORY_CARE_PROVIDER_SITE_OTHER): Payer: Medicare Other | Admitting: Neurology

## 2023-08-24 DIAGNOSIS — G20A1 Parkinson's disease without dyskinesia, without mention of fluctuations: Secondary | ICD-10-CM | POA: Diagnosis not present

## 2023-08-24 MED ORDER — CARBIDOPA-LEVODOPA 25-100 MG PO TABS
2.0000 | ORAL_TABLET | Freq: Three times a day (TID) | ORAL | 1 refills | Status: AC
Start: 2023-08-24 — End: ?

## 2023-08-24 NOTE — Patient Instructions (Signed)

## 2023-09-15 ENCOUNTER — Other Ambulatory Visit: Payer: Self-pay | Admitting: Family

## 2023-09-19 ENCOUNTER — Ambulatory Visit (INDEPENDENT_AMBULATORY_CARE_PROVIDER_SITE_OTHER): Admitting: Family

## 2023-09-19 ENCOUNTER — Encounter: Payer: Self-pay | Admitting: Family

## 2023-09-19 VITALS — BP 118/72 | HR 60 | Ht 75.0 in | Wt 191.2 lb

## 2023-09-19 DIAGNOSIS — E032 Hypothyroidism due to medicaments and other exogenous substances: Secondary | ICD-10-CM

## 2023-09-19 DIAGNOSIS — E785 Hyperlipidemia, unspecified: Secondary | ICD-10-CM

## 2023-09-19 DIAGNOSIS — Z95811 Presence of heart assist device: Secondary | ICD-10-CM | POA: Insufficient documentation

## 2023-09-19 DIAGNOSIS — J449 Chronic obstructive pulmonary disease, unspecified: Secondary | ICD-10-CM | POA: Diagnosis not present

## 2023-09-19 DIAGNOSIS — I495 Sick sinus syndrome: Secondary | ICD-10-CM

## 2023-09-19 LAB — CBC WITH DIFFERENTIAL/PLATELET
Basophils Absolute: 0.1 K/uL (ref 0.0–0.1)
Basophils Relative: 1.8 % (ref 0.0–3.0)
Eosinophils Absolute: 0.1 K/uL (ref 0.0–0.7)
Eosinophils Relative: 2.7 % (ref 0.0–5.0)
HCT: 40.5 % (ref 39.0–52.0)
Hemoglobin: 13.4 g/dL (ref 13.0–17.0)
Lymphocytes Relative: 15.2 % (ref 12.0–46.0)
Lymphs Abs: 0.7 K/uL (ref 0.7–4.0)
MCHC: 33.1 g/dL (ref 30.0–36.0)
MCV: 98.1 fl (ref 78.0–100.0)
Monocytes Absolute: 0.5 K/uL (ref 0.1–1.0)
Monocytes Relative: 11.9 % (ref 3.0–12.0)
Neutro Abs: 3 K/uL (ref 1.4–7.7)
Neutrophils Relative %: 68.4 % (ref 43.0–77.0)
Platelets: 183 K/uL (ref 150.0–400.0)
RBC: 4.13 Mil/uL — ABNORMAL LOW (ref 4.22–5.81)
RDW: 14.2 % (ref 11.5–15.5)
WBC: 4.3 K/uL (ref 4.0–10.5)

## 2023-09-19 MED ORDER — TRELEGY ELLIPTA 100-62.5-25 MCG/ACT IN AEPB: 1.0000 | INHALATION_SPRAY | Freq: Every day | RESPIRATORY_TRACT | 6 refills | Status: AC

## 2023-09-19 NOTE — Addendum Note (Signed)
 Addended by: TRECIA ALMARIE GRADE on: 09/19/2023 02:45 PM   Modules accepted: Orders

## 2023-09-19 NOTE — Progress Notes (Signed)
 Victor Osborne is a 89 y.o. male with the following history as recorded in Epic[employer]:  Patient Active Problem List   Diagnosis Date Noted   Presence of heart assist device (HCC) 09/19/2023   Hyperlipidemia 03/15/2022   TIA (transient ischemic attack) 03/14/2022   History of neoplasm 04/12/2021   Melanocytic nevi of trunk 04/12/2021   Other seborrheic keratosis 04/12/2021   Cerebral embolism with cerebral infarction 03/31/2021   Left leg weakness 03/30/2021   Chronic post-traumatic stress disorder 12/05/2017   Bipolar I disorder, current or most recent episode depressed, in partial remission (HCC) 12/05/2017   CHB (complete heart block) (HCC) 01/27/2017   HTN (hypertension) 01/27/2017   Hypothyroidism 10/11/2016   Parkinson disease (HCC) 09/08/2016   Chronic osteoarthritis 03/20/2015   Bipolar 1 disorder (HCC) 03/20/2015   Tremor 03/20/2015   Pacemaker 09/24/2013   SSS (sick sinus syndrome) (HCC) 06/19/2013   First degree AV block 04/09/2013   Allergic rhinitis 03/17/2013   Obstructive sleep apnea 12/18/2006   COPD mixed type (HCC) 12/18/2006   History of malignant melanoma of skin 12/18/2006    Current Outpatient Medications  Medication Sig Dispense Refill   acetaminophen  (TYLENOL ) 325 MG tablet Take 650 mg by mouth every 6 (six) hours as needed for mild pain or headache.     alclomethasone (ACLOVATE) 0.05 % ointment Apply 1 application topically 2 (two) times daily as needed (for rash).     carbamazepine  (TEGRETOL ) 200 MG tablet Take 1 tablet (200 mg total) by mouth 3 (three) times daily. 270 tablet 1   carbidopa -levodopa  (SINEMET  IR) 25-100 MG tablet Take 2 tablets by mouth 3 (three) times daily. 540 tablet 1   cholecalciferol  (VITAMIN D) 1000 UNITS tablet Take 2,000 Units by mouth daily.     clopidogrel  (PLAVIX ) 75 MG tablet Take 1 tablet (75 mg total) by mouth daily. 90 tablet 3   Coenzyme Q10 (CO Q 10 PO) Take 1 capsule by mouth daily.     ferrous sulfate  324 (65 Fe)  MG TBEC Take 324 mg by mouth daily.     Flaxseed, Linseed, (FLAX SEED OIL) 1300 MG CAPS Take 1 capsule by mouth daily.     FLUoxetine  (PROZAC ) 10 MG capsule Take 1 capsule (10 mg total) by mouth daily. 90 capsule 1   levothyroxine  (SYNTHROID ) 125 MCG tablet TAKE 1 TABLET BY MOUTH DAILY BEFORE BREAKFAST. APPOINTMENT NEEDED FOR REFILLS 30 tablet 0   MAGNESIUM PO Take 1 capsule by mouth daily.     Multiple Vitamin (MULITIVITAMIN WITH MINERALS) TABS Take 1 tablet by mouth daily.     Omega-3 Fatty Acids (OMEGA-3 CF PO) Take 1,200 mg by mouth daily.     POTASSIUM GLUCONATE PO Take 1 capsule by mouth daily.     pravastatin  (PRAVACHOL ) 80 MG tablet Take 1 tablet (80 mg total) by mouth daily. 90 tablet 0   QUEtiapine  (SEROQUEL  XR) 50 MG TB24 24 hr tablet Take 1 tablet (50 mg total) by mouth at bedtime. 90 tablet 1   QUEtiapine  (SEROQUEL ) 25 MG tablet Take 1 tablet (25 mg total) by mouth at bedtime. Take with Seroquel  XR 50 mg. 90 tablet 1   tamsulosin  (FLOMAX ) 0.4 MG CAPS capsule Take 0.4 mg by mouth at bedtime.     Fluticasone -Umeclidin-Vilant (TRELEGY ELLIPTA ) 100-62.5-25 MCG/ACT AEPB Inhale 1 puff into the lungs daily. 1 each 6   No current facility-administered medications for this visit.    Allergies: Patient has no known allergies.  Past Medical History:  Diagnosis Date  Bipolar 1 disorder (HCC)    COPD (chronic obstructive pulmonary disease) (HCC)    Depression    First degree AV block    Hypertension    pt denies but was in medical record   Hypoglycemia, unspecified    Myalgia and myositis, unspecified    Obesity    Pneumonia 1970   Skin cancer (melanoma) (HCC)    Thyroid  disease    Unspecified hypothyroidism    UPJ obstruction, congenital     Past Surgical History:  Procedure Laterality Date   APPENDECTOMY  1939   malignant melanoma removed from right forehead  2008   PERMANENT PACEMAKER INSERTION N/A 06/19/2013   Procedure: PERMANENT PACEMAKER INSERTION;  Surgeon: Danelle LELON Birmingham, MD;  Location: [facility] CATH LAB;  Service: Cardiovascular;  Laterality: N/A;   PPM GENERATOR CHANGEOUT N/A 09/28/2022   Procedure: PPM GENERATOR CHANGEOUT;  Surgeon: Birmingham Danelle LELON, MD;  Location[facility] INVASIVE CV LAB;  Service: Cardiovascular;  Laterality: N/A;   right big toe  surgery  2009   TRANSURETHRAL RESECTION OF PROSTATE  02/04/2011   Procedure: TRANSURETHRAL RESECTION OF THE PROSTATE (TURP);  Surgeon: Noretta Ferrara, MD;  Location: [facility] ORS;  Service: Urology;  Laterality: N/A;   URETHRA SURGERY  2010   reconstruction    Family History  Problem Relation Age of Onset   Cerebral aneurysm Mother    Lung cancer Father    Drug abuse Daughter    Bipolar disorder Daughter     Social History   Tobacco Use   Smoking status: Former    Current packs/day: 0.00    Average packs/day: 2.0 packs/day for 50.0 years (100.0 ttl pk-yrs)    Types: Cigarettes    Start date: 39    Quit date: 39    Years since quitting: 30.6   Smokeless tobacco: Never  Substance Use Topics   Alcohol use: No    Subjective:   Needs yearly follow up on thyroid  and lipids; no acute concerns today; seeing his neurologist every 6 months; needs handicapped placard updated;   Objective:  Vitals:   09/19/23 1042  BP: 118/72  Pulse: 60  SpO2: 98%  Weight: 191 lb 3.2 oz (86.7 kg)  Height: 6' 3 (1.905 m)    General: Well developed, well nourished, in no acute distress  Skin : Warm and dry.  Head: Normocephalic and atraumatic  Lungs: Respirations unlabored; clear to auscultation bilaterally without wheeze, rales, rhonchi  CVS exam: normal rate and regular rhythm.  Neurologic: Alert and oriented; speech intact; face symmetrical; uses hiking stick for support while walking; CNII-XII intact without focal deficit   Assessment:  1. Hypothyroidism due to medication   2. Hyperlipidemia, unspecified hyperlipidemia type   3. Presence of heart assist device (HCC) Chronic  4. COPD mixed type (HCC) Chronic  5.  Sinoatrial node dysfunction (HCC) Chronic    Plan:  Will update labs today; will update refills as needed based on lab results;  He will continue with his cardiologist and neurologist as scheduled;  He does not use Trelegy regularly but refill is updated to use as needed.   No follow-ups on file.  Orders Placed This Encounter  Procedures   CBC with Differential/Platelet   Comp Met (CMET)   TSH   Lipid panel    Requested Prescriptions   Signed Prescriptions Disp Refills   Fluticasone -Umeclidin-Vilant (TRELEGY ELLIPTA ) 100-62.5-25 MCG/ACT AEPB 1 each 6    Sig: Inhale 1 puff into the lungs daily.

## 2023-09-20 ENCOUNTER — Telehealth: Payer: Self-pay | Admitting: Family

## 2023-09-20 ENCOUNTER — Ambulatory Visit: Payer: Self-pay | Admitting: Family

## 2023-09-20 NOTE — Telephone Encounter (Signed)
 I called yesterday and left a message about his blood work being hemolyzed. He needs to be redrawn.

## 2023-09-22 ENCOUNTER — Other Ambulatory Visit

## 2023-09-26 ENCOUNTER — Other Ambulatory Visit (INDEPENDENT_AMBULATORY_CARE_PROVIDER_SITE_OTHER)

## 2023-09-26 DIAGNOSIS — E032 Hypothyroidism due to medicaments and other exogenous substances: Secondary | ICD-10-CM

## 2023-09-26 DIAGNOSIS — E785 Hyperlipidemia, unspecified: Secondary | ICD-10-CM

## 2023-09-26 LAB — COMPREHENSIVE METABOLIC PANEL WITH GFR
ALT: 17 U/L (ref 0–53)
AST: 20 U/L (ref 0–37)
Albumin: 4.5 g/dL (ref 3.5–5.2)
Alkaline Phosphatase: 72 U/L (ref 39–117)
BUN: 31 mg/dL — ABNORMAL HIGH (ref 6–23)
CO2: 28 meq/L (ref 19–32)
Calcium: 9.5 mg/dL (ref 8.4–10.5)
Chloride: 107 meq/L (ref 96–112)
Creatinine, Ser: 1.21 mg/dL (ref 0.40–1.50)
GFR: 53.32 mL/min — ABNORMAL LOW (ref >60.00)
Glucose, Bld: 87 mg/dL (ref 70–99)
Potassium: 4.7 meq/L (ref 3.5–5.1)
Sodium: 144 meq/L (ref 135–145)
Total Bilirubin: 0.5 mg/dL (ref 0.2–1.2)
Total Protein: 6.7 g/dL (ref 6.0–8.3)

## 2023-09-26 LAB — LIPID PANEL
Cholesterol: 166 mg/dL (ref 0–200)
HDL: 85.7 mg/dL (ref >39.00)
LDL Cholesterol: 68 mg/dL (ref 0–99)
NonHDL: 80.42
Total CHOL/HDL Ratio: 2
Triglycerides: 60 mg/dL (ref 0.0–149.0)
VLDL: 12 mg/dL (ref 0.0–40.0)

## 2023-09-26 LAB — TSH: TSH: 2.94 u[IU]/mL (ref 0.35–5.50)

## 2023-10-02 ENCOUNTER — Ambulatory Visit (INDEPENDENT_AMBULATORY_CARE_PROVIDER_SITE_OTHER): Payer: Medicare Other

## 2023-10-02 DIAGNOSIS — I495 Sick sinus syndrome: Secondary | ICD-10-CM

## 2023-10-04 LAB — CUP PACEART REMOTE DEVICE CHECK
Battery Remaining Longevity: 138 mo
Battery Remaining Percentage: 100 %
Brady Statistic RA Percent Paced: 92 %
Brady Statistic RV Percent Paced: 100 %
Date Time Interrogation Session: 20250915112900
Implantable Lead Connection Status: 753985
Implantable Lead Connection Status: 753985
Implantable Lead Implant Date: 20150603
Implantable Lead Implant Date: 20150603
Implantable Lead Location: 753859
Implantable Lead Location: 753860
Implantable Lead Model: 4136
Implantable Lead Model: 4137
Implantable Lead Serial Number: 29477746
Implantable Lead Serial Number: 29617326
Implantable Pulse Generator Implant Date: 20240911
Lead Channel Impedance Value: 550 Ohm
Lead Channel Impedance Value: 727 Ohm
Lead Channel Pacing Threshold Amplitude: 0.4 V
Lead Channel Pacing Threshold Amplitude: 1.8 V
Lead Channel Pacing Threshold Pulse Width: 0.4 ms
Lead Channel Pacing Threshold Pulse Width: 0.4 ms
Lead Channel Setting Pacing Amplitude: 2.3 V
Lead Channel Setting Pacing Amplitude: 2.5 V
Lead Channel Setting Pacing Pulse Width: 0.4 ms
Lead Channel Setting Sensing Sensitivity: 2.5 mV
Pulse Gen Serial Number: 664123
Zone Setting Status: 755011

## 2023-10-06 ENCOUNTER — Ambulatory Visit: Payer: Self-pay | Admitting: Internal Medicine

## 2023-10-09 NOTE — Progress Notes (Signed)
 Remote PPM Transmission

## 2023-10-18 ENCOUNTER — Telehealth: Payer: Self-pay | Admitting: Family

## 2023-10-18 DIAGNOSIS — E032 Hypothyroidism due to medicaments and other exogenous substances: Secondary | ICD-10-CM

## 2023-10-18 MED ORDER — LEVOTHYROXINE SODIUM 125 MCG PO TABS: 125.0000 ug | ORAL_TABLET | Freq: Every day | ORAL | 0 refills | Status: DC

## 2023-10-18 NOTE — Telephone Encounter (Signed)
 TSH was good in September. I'll send enough to get him to our appointment. Thank you!

## 2023-10-18 NOTE — Telephone Encounter (Signed)
 Pt states he needs his thyroid  medication refilled. 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

## 2023-10-18 NOTE — Telephone Encounter (Signed)
 Last refill states last refill without office visit which was sent in at the end of August, but then Leita left and he does not have a visit until January. Please advise.

## 2023-11-09 ENCOUNTER — Other Ambulatory Visit: Payer: Self-pay | Admitting: Physician Assistant

## 2023-11-09 DIAGNOSIS — F3175 Bipolar disorder, in partial remission, most recent episode depressed: Secondary | ICD-10-CM

## 2023-11-15 ENCOUNTER — Other Ambulatory Visit: Payer: Self-pay | Admitting: Family Medicine

## 2023-11-15 MED ORDER — PRAVASTATIN SODIUM 80 MG PO TABS: 80.0000 mg | ORAL_TABLET | Freq: Every day | ORAL | 0 refills | Status: DC

## 2023-11-15 NOTE — Telephone Encounter (Unsigned)
 Copied from CRM [id number]. Topic: Clinical - Medication Refill >> Nov 15, 2023  1:34 PM Macario HERO wrote: Medication: pravastatin  (PRAVACHOL ) 80 MG tablet [[id number]]  Has the patient contacted their pharmacy? No (Agent: If no, request that the patient contact the pharmacy for the refill. If patient does not wish to contact the pharmacy document the reason why and proceed with request.) (Agent: If yes, when and what did the pharmacy advise?)  This is the patient's preferred pharmacy:  [id number] - [place], [place] - [address], [address] AT [facility] [address], [address] [place] [address] Phone: [phone] Fax: [phone]  Is this the correct pharmacy for this prescription? Yes If no, delete pharmacy and type the correct one.   Has the prescription been filled recently? Yes  Is the patient out of the medication? Yes  Has the patient been seen for an appointment in the last year OR does the patient have an upcoming appointment? Yes  Can we respond through MyChart? No  Agent: Please be advised that Rx refills may take up to 3 business days. We ask that you follow-up with your pharmacy.

## 2023-12-04 ENCOUNTER — Emergency Department (HOSPITAL_BASED_OUTPATIENT_CLINIC_OR_DEPARTMENT_OTHER)

## 2023-12-04 ENCOUNTER — Other Ambulatory Visit: Payer: Self-pay

## 2023-12-04 ENCOUNTER — Emergency Department (HOSPITAL_BASED_OUTPATIENT_CLINIC_OR_DEPARTMENT_OTHER)
Admission: EM | Admit: 2023-12-04 | Discharge: 2023-12-04 | Disposition: A | Discharge status: Home / Self Care | Attending: Emergency Medicine | Admitting: Emergency Medicine

## 2023-12-04 ENCOUNTER — Ambulatory Visit: Payer: Self-pay | Admitting: *Deleted

## 2023-12-04 ENCOUNTER — Encounter (HOSPITAL_BASED_OUTPATIENT_CLINIC_OR_DEPARTMENT_OTHER): Payer: Self-pay | Admitting: Urology

## 2023-12-04 DIAGNOSIS — Z7951 Long term (current) use of inhaled steroids: Secondary | ICD-10-CM | POA: Diagnosis not present

## 2023-12-04 DIAGNOSIS — R531 Weakness: Secondary | ICD-10-CM | POA: Diagnosis not present

## 2023-12-04 DIAGNOSIS — Z950 Presence of cardiac pacemaker: Secondary | ICD-10-CM | POA: Diagnosis not present

## 2023-12-04 DIAGNOSIS — R4701 Aphasia: Secondary | ICD-10-CM | POA: Insufficient documentation

## 2023-12-04 DIAGNOSIS — Z8673 Personal history of transient ischemic attack (TIA), and cerebral infarction without residual deficits: Secondary | ICD-10-CM | POA: Insufficient documentation

## 2023-12-04 DIAGNOSIS — F319 Bipolar disorder, unspecified: Secondary | ICD-10-CM | POA: Diagnosis not present

## 2023-12-04 DIAGNOSIS — R200 Anesthesia of skin: Secondary | ICD-10-CM | POA: Insufficient documentation

## 2023-12-04 DIAGNOSIS — Z79899 Other long term (current) drug therapy: Secondary | ICD-10-CM | POA: Diagnosis not present

## 2023-12-04 DIAGNOSIS — J449 Chronic obstructive pulmonary disease, unspecified: Secondary | ICD-10-CM | POA: Insufficient documentation

## 2023-12-04 DIAGNOSIS — G20C Parkinsonism, unspecified: Secondary | ICD-10-CM | POA: Insufficient documentation

## 2023-12-04 LAB — URINALYSIS, ROUTINE W REFLEX MICROSCOPIC
Bilirubin Urine: NEGATIVE
Glucose, UA: NEGATIVE mg/dL
Hgb urine dipstick: NEGATIVE
Ketones, ur: NEGATIVE mg/dL
Leukocytes,Ua: NEGATIVE
Nitrite: NEGATIVE
Protein, ur: NEGATIVE mg/dL
Specific Gravity, Urine: 1.01 (ref 1.005–1.030)
pH: 6 (ref 5.0–8.0)

## 2023-12-04 LAB — DIFFERENTIAL
Abs Immature Granulocytes: 0.01 K/uL (ref 0.00–0.07)
Basophils Absolute: 0.1 K/uL (ref 0.0–0.1)
Basophils Relative: 1 %
Eosinophils Absolute: 0.1 K/uL (ref 0.0–0.5)
Eosinophils Relative: 1 %
Immature Granulocytes: 0 %
Lymphocytes Relative: 12 %
Lymphs Abs: 0.8 K/uL (ref 0.7–4.0)
Monocytes Absolute: 0.7 K/uL (ref 0.1–1.0)
Monocytes Relative: 10 %
Neutro Abs: 5.2 K/uL (ref 1.7–7.7)
Neutrophils Relative %: 76 %

## 2023-12-04 LAB — CBC
HCT: 42.8 % (ref 39.0–52.0)
Hemoglobin: 13.9 g/dL (ref 13.0–17.0)
MCH: 32 pg (ref 26.0–34.0)
MCHC: 32.5 g/dL (ref 30.0–36.0)
MCV: 98.6 fL (ref 80.0–100.0)
Platelets: 226 K/uL (ref 150–400)
RBC: 4.34 MIL/uL (ref 4.22–5.81)
RDW: 13.5 % (ref 11.5–15.5)
WBC: 6.9 K/uL (ref 4.0–10.5)
nRBC: 0 % (ref 0.0–0.2)

## 2023-12-04 LAB — COMPREHENSIVE METABOLIC PANEL WITH GFR
ALT: 14 U/L (ref 0–44)
AST: 18 U/L (ref 15–41)
Albumin: 4.4 g/dL (ref 3.5–5.0)
Alkaline Phosphatase: 88 U/L (ref 38–126)
Anion gap: 13 (ref 5–15)
BUN: 24 mg/dL — ABNORMAL HIGH (ref 8–23)
CO2: 23 mmol/L (ref 22–32)
Calcium: 9.7 mg/dL (ref 8.9–10.3)
Chloride: 108 mmol/L (ref 98–111)
Creatinine, Ser: 0.98 mg/dL (ref 0.61–1.24)
GFR, Estimated: >60 mL/min (ref >60)
Glucose, Bld: 100 mg/dL — ABNORMAL HIGH (ref 70–99)
Potassium: 4.2 mmol/L (ref 3.5–5.1)
Sodium: 144 mmol/L (ref 135–145)
Total Bilirubin: 0.4 mg/dL (ref 0.0–1.2)
Total Protein: 6.8 g/dL (ref 6.5–8.1)

## 2023-12-04 LAB — URINE DRUG SCREEN
Amphetamines: NEGATIVE
Barbiturates: NEGATIVE
Benzodiazepines: NEGATIVE
Cocaine: NEGATIVE
Fentanyl: NEGATIVE
Methadone Scn, Ur: NEGATIVE
Opiates: NEGATIVE
Tetrahydrocannabinol: NEGATIVE

## 2023-12-04 LAB — APTT: aPTT: 28 s (ref 24–36)

## 2023-12-04 LAB — RESP PANEL BY RT-PCR (RSV, FLU A&B, COVID)  RVPGX2
Influenza A by PCR: NEGATIVE
Influenza B by PCR: NEGATIVE
Resp Syncytial Virus by PCR: NEGATIVE
SARS Coronavirus 2 by RT PCR: NEGATIVE

## 2023-12-04 LAB — FOLATE: Folate: 14.9 ng/mL (ref >5.9)

## 2023-12-04 LAB — PROTIME-INR
INR: 1 (ref 0.8–1.2)
Prothrombin Time: 13.6 s (ref 11.4–15.2)

## 2023-12-04 LAB — T4, FREE: Free T4: 0.65 ng/dL (ref 0.61–1.12)

## 2023-12-04 LAB — TSH: TSH: 2.27 u[IU]/mL (ref 0.350–4.500)

## 2023-12-04 LAB — CBG MONITORING, ED: Glucose-Capillary: 80 mg/dL (ref 70–99)

## 2023-12-04 LAB — VITAMIN B12: Vitamin B-12: 584 pg/mL (ref 180–914)

## 2023-12-04 NOTE — Telephone Encounter (Signed)
 FYI Only or Action Required?: Action required by provider: request for appointment.  Patient was last seen in primary care on 09/19/2023 by Jason Leita Repine, FNP.  Called Nurse Triage reporting Leg Pain.  Symptoms began a week ago.  Interventions attempted: OTC medications: tylenol .  Symptoms are: gradually worsening.  Triage Disposition: See Physician Within 24 Hours  Patient/caregiver understands and will follow disposition?: no open appointment- patient states he has never ben to an UC- would prefer OV.   Copied from CRM [id number]. Topic: Clinical - Red Word Triage >> Dec 04, 2023 11:16 AM Mercedes MATSU wrote: Red Word that prompted transfer to Nurse Triage: Patient called in stating that he is having pain and tingling in his legs. He said he is a former patient of NP Leita Jason, and he has a TOC appointment already scheduled but he is requesting to be seen sooner. Reason for Disposition  Numbness in a leg or foot (i.e., loss of sensation)  Answer Assessment - Initial Assessment Questions 1. ONSET: When did the pain start?      1 week 2. LOCATION: Where is the pain located?      Tingling, weakness in both legs 3. PAIN: How bad is the pain?    (Scale 1-10; or mild, moderate, severe)     No pain- tingling- feels asleep 4. WORK OR EXERCISE: Has there been any recent work or exercise that involved this part of the body?      na 5. CAUSE: What do you think is causing the leg pain?     Unsure- thought  he had bug but progressively worse.  6. OTHER SYMPTOMS: Do you have any other symptoms? (e.g., chest pain, back pain, breathing difficulty, swelling, rash, fever, numbness, weakness)     Weakness in legs, balance issues, feels chills  Protocols used: Leg Pain-A-AH

## 2023-12-04 NOTE — ED Provider Notes (Signed)
  Physical Exam  BP (!) 167/70   Pulse 60   Temp 98 F (36.7 C) (Oral)   Resp 20   Ht 6' 3 (1.905 m)   Wt 86.7 kg   SpO2 100%   BMI 23.89 kg/m   Physical Exam  Procedures  Procedures  ED Course / MDM   Clinical Course as of 12/04/23 1643  Mon Dec 04, 2023  1522 Signed out to Dr Jerrol [RP]    Clinical Course User Index [RP] Yolande Lamar BROCKS, MD   Medical Decision Making Amount and/or Complexity of Data Reviewed Labs: ordered. Radiology: ordered.   44M hx of Parkinsons disease, CVA, bipolar disorder, presenting with fatigue and weakness for a few weeks. Nonfocal weakness, possibly worsening neuropathy in feet. ?LUE weakness and slurred speech. 1 week ago, was at diner, poss episode of slurred speech then. Resolved. Normal neuro exam today. Waiting on labs and imaging for weakness, low concern for TIA/CVA. Needs ambulatory trial, device interrogation.   CT Head: IMPRESSION:  1. No acute intracranial abnormality.   CXR: IMPRESSION:  No acute findings.    Labs: CBC without a leukocytosis or anemia, urinalysis negative, CMP unremarkable, CBG normal, UDS negative, COVID flu and RSV PCR testing negative.  Folate, B12, TSH all collected and pending.  On repeat assessment, the patient states that he was feeling improved and back to his baseline.  I spoke with the device rep for the patient's pacemaker and no immediate interrogation was available within the next few hours based on rep availability.  The patient has had no episodes of chest pain, heart palpitations or syncope.  He has home monitoring for his pacemaker and alerts go off when there have been any abnormalities.  Feel that it is reasonable to defer interrogation of his pacemaker at this time in the setting of his symptoms.  Patient is neurologically intact, ambulatory in the emergency department and at his baseline, overall stable for discharge and outpatient PCP follow-up.    Jerrol Agent, MD 12/04/23  [phone]

## 2023-12-04 NOTE — Telephone Encounter (Signed)
 Spoke with patient and he stated that he is having tingling in his legs, he feel weak, has chills, and person in back ground stated that his speech is slurred.  Advised patient to go to ER now to be evaluated.  Pt agreed and will go.

## 2023-12-04 NOTE — Discharge Instructions (Signed)
 Your laboratory evaluation was overall reassuring and you are back to your baseline.  Recommend you follow-up with your primary care provider regarding your symptoms of fatigue, your thyroid  function tests are pending at discharge and will require PCP follow-up.

## 2023-12-04 NOTE — ED Notes (Signed)
 Patient transported to CT

## 2023-12-04 NOTE — ED Triage Notes (Signed)
 Pt states was sick approx 10 days ago had bug  States approx 3-4 days girlfriend noted slurred speech and left hand weakness  States tingling to bilateral legs  Feels off balance and shaky   H/o parkinson's

## 2023-12-04 NOTE — Telephone Encounter (Signed)
 Patient reports he has been advised to go to emergency department

## 2023-12-04 NOTE — ED Provider Notes (Signed)
 [facility] Provider Note   CSN: [id number] Arrival date & time: 12/04/23  1357     Patient presents with: Numbness and Aphasia   Victor Osborne is a 89 y.o. male.   89 year old male with a history of bipolar on carbamazepine , COPD, sinus syndrome status post [employer] scientific pacemaker, stroke, and Parkinson's disease who presents to the emergency department with multiple complaints.  Patient reports that for the past 2 weeks she has had numbness of both of his legs.  Says it has been progressing and today started moving up his legs which typically resolves after walking around.  Says that he has had tremor in both of his hands.  Says that he feels off balance and shaky.   Not on lithium.  Has been compliant with his carbidopa  levodopa .   Called his girlfriend Victor Osborne who says that he has been weaker than usual for the past 2 weeks.  Describes it as generalized weakness.  Did have a cold several weeks ago that she thinks may be related to it.  Said that a week ago his speech seemed slurred and he seemed generally weak so they had to pick him up from the diner and bring him back home but it resolved shortly thereafter.  Did also get his flu shot on Friday.       Prior to Admission medications   Medication Sig Start Date End Date Taking? Authorizing Provider  triamcinolone ointment (KENALOG) 0.1 % Apply 1 Application topically 2 (two) times daily. 11/09/23  Yes [provider]  acetaminophen  (TYLENOL ) 325 MG tablet Take 650 mg by mouth every 6 (six) hours as needed for mild pain or headache.    [provider]  alclomethasone (ACLOVATE) 0.05 % ointment Apply 1 application topically 2 (two) times daily as needed (for rash).    [provider]  carbamazepine  (TEGRETOL ) 200 MG tablet TAKE 1 TABLET BY MOUTH 3 TIMES DAILY. 11/10/23   Rhys Boyer T, PA-C  carbidopa -levodopa  (SINEMET  IR) 25-100 MG tablet Take 2 tablets by  mouth 3 (three) times daily. 08/24/23   Tat, Asberry RAMAN, DO  cholecalciferol  (VITAMIN D) 1000 UNITS tablet Take 2,000 Units by mouth daily.    [provider]  clopidogrel  (PLAVIX ) 75 MG tablet Take 1 tablet (75 mg total) by mouth daily. 05/09/23   Jason Leita Repine, FNP  Coenzyme Q10 (CO Q 10 PO) Take 1 capsule by mouth daily.    [provider]  ferrous sulfate  324 (65 Fe) MG TBEC Take 324 mg by mouth daily.    [provider]  Flaxseed, Linseed, (FLAX SEED OIL) 1300 MG CAPS Take 1 capsule by mouth daily.    [provider]  FLUoxetine  (PROZAC ) 10 MG capsule Take 1 capsule (10 mg total) by mouth daily. 05/09/23   Rhys Boyer T, PA-C  Fluticasone -Umeclidin-Vilant (TRELEGY ELLIPTA ) 100-62.5-25 MCG/ACT AEPB Inhale 1 puff into the lungs daily. 09/19/23   Jason Leita Repine, FNP  ketoconazole (NIZORAL) 2 % cream Apply 1 Application topically daily.    [provider]  levothyroxine  (SYNTHROID ) 125 MCG tablet Take 1 tablet (125 mcg total) by mouth daily before breakfast. 10/18/23   Almarie Birmingham B, NP  MAGNESIUM PO Take 1 capsule by mouth daily.    [provider]  Multiple Vitamin (MULITIVITAMIN WITH MINERALS) TABS Take 1 tablet by mouth daily.    [provider]  Omega-3 Fatty Acids (OMEGA-3 CF PO) Take 1,200 mg by mouth daily.  [provider]  POTASSIUM GLUCONATE PO Take 1 capsule by mouth daily.    [provider]  pravastatin  (PRAVACHOL ) 80 MG tablet Take 1 tablet (80 mg total) by mouth daily. 11/15/23   Webb, Padonda B, FNP  QUEtiapine  (SEROQUEL  XR) 50 MG TB24 24 hr tablet Take 1 tablet (50 mg total) by mouth at bedtime. 08/07/23   Rhys Verneita DASEN, PA-C  QUEtiapine  (SEROQUEL ) 25 MG tablet Take 1 tablet (25 mg total) by mouth at bedtime. Take with Seroquel  XR 50 mg. 08/07/23   Rhys Verneita T, PA-C  tamsulosin  (FLOMAX ) 0.4 MG CAPS capsule Take 0.4 mg by mouth at bedtime. 02/15/21   [provider]     Allergies: Patient has no known allergies.    Review of Systems  Updated Vital Signs BP (!) 167/70   Pulse 60   Temp 98 F (36.7 C) (Oral)   Resp 20   Ht 6' 3 (1.905 m)   Wt 86.7 kg   SpO2 100%   BMI 23.89 kg/m   Physical Exam Vitals and nursing note reviewed.  Constitutional:      General: He is not in acute distress.    Appearance: He is well-developed.  HENT:     Head: Normocephalic and atraumatic.     Right Ear: External ear normal.     Left Ear: External ear normal.     Nose: Nose normal.  Eyes:     Extraocular Movements: Extraocular movements intact.     Conjunctiva/sclera: Conjunctivae normal.     Pupils: Pupils are equal, round, and reactive to light.  Cardiovascular:     Rate and Rhythm: Normal rate and regular rhythm.     Heart sounds: Normal heart sounds.  Pulmonary:     Effort: Pulmonary effort is normal. No respiratory distress.     Breath sounds: Normal breath sounds.  Musculoskeletal:     Cervical back: Normal range of motion and neck supple.     Right lower leg: No edema.     Left lower leg: No edema.  Skin:    General: Skin is warm and dry.  Neurological:     Mental Status: He is alert.     Comments: NIHSS Exam  Level of Consciousness: Alert  LOC Questions: Answers Month and Age Correctly  LOC Commands: Opens and Closes Eyes and Hands on command  Best Gaze: Horizontal ocular movements intact  Visual Fields: No visual field loss  Facial Palsy: None  L Upper Extremity Motor: No drift after 10 seconds  R Upper Extremity Motor: No drift after 10 seconds  L Lower extremity Motor: No drift after 5 seconds  R Lower extremity Motor: No drift after 5 seconds  Ataxia: Absent  Sensory: Intact sensation to light touch on face, arms, trunk, and legs bilaterally  Best Language: No aphasia  Dysarthria: No dysarthria  Neglect: No visual or sensory neglect  Mild resting tremor Full strength in dorsi and plantar flexion of the ankles. Full strength  in great toe dorsiflexion.  Intact sensation to light touch in all dermatomes of the bilateral feet.  Patellar reflex 2+.  Achilles reflex 2+.  No ankle clonus.  Psychiatric:        Mood and Affect: Mood normal.        Behavior: Behavior normal.     (all labs ordered are listed, but only abnormal results are displayed) Labs Reviewed  RESP PANEL BY RT-PCR (RSV, FLU A&B, COVID)  [id number]  PROTIME-INR  APTT  CBC  DIFFERENTIAL  COMPREHENSIVE METABOLIC PANEL WITH GFR  URINE DRUG SCREEN  URINALYSIS, ROUTINE W REFLEX MICROSCOPIC  TSH  T4, FREE  VITAMIN B12  FOLATE  CBG MONITORING, ED    EKG: EKG Interpretation Date/Time:  Monday December 04 2023 14:24:11 EST Ventricular Rate:  147 PR Interval:  151 QRS Duration:  198 QT Interval:  229 QTC Calculation: 271 R Axis:   -86  Text Interpretation: VENTRICULAR PACED RHYTHM Confirmed by Yolande Charleston [phone]) on 12/04/2023 3:23:12 PM  Radiology: CT Head Wo Contrast Result Date: 12/04/2023 EXAM: CT HEAD WITHOUT CONTRAST 12/04/2023 02:56:00 PM TECHNIQUE: CT of the head was performed without the administration of intravenous contrast. Automated exposure control, iterative reconstruction, and/or weight based adjustment of the mA/kV was utilized to reduce the radiation dose to as low as reasonably achievable. COMPARISON: CT head 03/14/2022. CLINICAL HISTORY: generalized weakness FINDINGS: BRAIN AND VENTRICLES: No acute hemorrhage. No evidence of acute infarct. No hydrocephalus. No extra-axial collection. No mass effect or midline shift. Mild age related volume loss. Minimal chronic microvascular ischemic changes. ORBITS: Postsurgical changes of the globes. SINUSES: Mucosal thickening in the ethmoid sinuses. SOFT TISSUES AND SKULL: No acute soft tissue abnormality. No skull fracture. IMPRESSION: 1. No acute intracranial abnormality. Electronically signed by: Donnice Mania MD 12/04/2023 03:05 PM EST RP Workstation: [id number]     Procedures    Medications Ordered in the ED - No data to display  Clinical Course as of 12/04/23 1524  Mon Dec 04, 2023  1522 Signed out to Dr Jerrol [RP]    Clinical Course User Index [RP] Yolande Charleston BROCKS, MD                                 Medical Decision Making Amount and/or Complexity of Data Reviewed Labs: ordered. Radiology: ordered.   Chan Sheahan is a 89 year old male with a history of bipolar on carbamazepine , COPD, sinus syndrome status post boston scientific pacemaker, stroke, and Parkinson's disease who presents to the emergency department with multiple complaints.  Initial Ddx:  Peripheral neuropathy, generalized weakness, Guillain-Barr, stroke, TIA, anemia, hypothyroidism  MDM/Course:  Patient presents to the emergency department with bilateral leg numbness and generalized weakness for several weeks.  Initially was complaining of some left-sided weakness and slurred speech but after talking to his girlfriend it appears to be more of a globalized weakness picture rather than unilateral weakness.  I do not see any other signs of stroke.  Does not have any focal neurologic deficits.  He has good reflexes and distal motor strength.  Given his exam feel that stroke and Guillain-Barr are highly unlikely.  Suspect that he may have a component of a peripheral neuropathy with this change in sensation of his feet but does not have any objective decrease sensation to light touch in his feet.  With his recent viral illness and cough will get a chest x-ray and COVID and flu.  Will check a head CT to rule out any gross abnormalities and get basic lab work as well to make sure he is not not anemic or have an electrolyte abnormality like hyponatremia that would explain his symptoms.  Will also check his thyroid  function and send off B12 and folate as part of a peripheral neuropathy workup.  Signout to the oncoming physician awaiting results of the studies.  Upon re-evaluation remained  stable.  This patient presents to the ED for concern of complaints listed in HPI, this  involves an extensive number of treatment options, and is a complaint that carries with it a high risk of complications and morbidity. Disposition including potential need for admission considered.   Dispo: Pending remainder of workup  Additional history obtained from significant other Records reviewed Outpatient Clinic Notes The following labs were independently interpreted: CBC and show no acute abnormality I independently reviewed the following imaging with scope of interpretation limited to determining acute life threatening conditions related to emergency care: CT head and agree with the radiologist interpretation with the following exceptions: none I personally reviewed and interpreted cardiac monitoring: normal sinus rhythm  I personally reviewed and interpreted the pt's EKG: see above for interpretation  I have reviewed the patients home medications and made adjustments as needed Social Determinants of health:  Geriatric  Portions of this note were generated with Scientist, clinical (histocompatibility and immunogenetics). Dictation errors may occur despite best attempts at proofreading.     Final diagnoses:  Generalized weakness    ED Discharge Orders     None          Yolande Lamar BROCKS, MD 12/04/23 1524

## 2023-12-04 NOTE — ED Notes (Signed)
 ED Provider at bedside.

## 2023-12-07 ENCOUNTER — Encounter: Payer: Self-pay | Admitting: Family Medicine

## 2023-12-07 ENCOUNTER — Ambulatory Visit (INDEPENDENT_AMBULATORY_CARE_PROVIDER_SITE_OTHER): Admitting: Family Medicine

## 2023-12-07 VITALS — BP 131/71 | HR 70 | Ht 75.0 in | Wt 188.0 lb

## 2023-12-07 DIAGNOSIS — R531 Weakness: Secondary | ICD-10-CM | POA: Diagnosis not present

## 2023-12-07 DIAGNOSIS — Z09 Encounter for follow-up examination after completed treatment for conditions other than malignant neoplasm: Secondary | ICD-10-CM

## 2023-12-07 NOTE — Progress Notes (Signed)
 Established Patient Office Visit  Subjective:  Patient ID: Victor Osborne, male    DOB: 09-20-1932  Age: 89 y.o. MRN: [id number]  CC:  Chief Complaint  Patient presents with   Hospitalization Follow-up     HPI Victor Osborne is here for emergency department follow-up. Seen in the emergency department on 12/04/2023 for fatigue and weakness for the past couple of weeks. He also noted an instance of slurred speech. Both CT head and CXR were normal. Labs all normal. Reports he has been feeling back to baseline since being back home, no new concerns. He is wondering if maybe he had a virus that was making him feel bad at the time. No symptoms currently.      Past Medical History:  Diagnosis Date   Bipolar 1 disorder (HCC)    COPD (chronic obstructive pulmonary disease) (HCC)    Depression    First degree AV block    Hypertension    pt denies but was in medical record   Hypoglycemia, unspecified    Myalgia and myositis, unspecified    Obesity    Pneumonia 1970   Skin cancer (melanoma) (HCC)    Thyroid  disease    Unspecified hypothyroidism    UPJ obstruction, congenital     Past Surgical History:  Procedure Laterality Date   APPENDECTOMY  1939   malignant melanoma removed from right forehead  2008   PERMANENT PACEMAKER INSERTION N/A 06/19/2013   Procedure: PERMANENT PACEMAKER INSERTION;  Surgeon: Danelle LELON Birmingham, MD;  Location: [facility] CATH LAB;  Service: Cardiovascular;  Laterality: N/A;   PPM GENERATOR CHANGEOUT N/A 09/28/2022   Procedure: PPM GENERATOR CHANGEOUT;  Surgeon: Birmingham Danelle LELON, MD;  Location: [facility] INVASIVE CV LAB;  Service: Cardiovascular;  Laterality: N/A;   right big toe  surgery  2009   TRANSURETHRAL RESECTION OF PROSTATE  02/04/2011   Procedure: TRANSURETHRAL RESECTION OF THE PROSTATE (TURP);  Surgeon: Noretta Ferrara, MD;  Location: [facility] ORS;  Service: Urology;  Laterality: N/A;   URETHRA SURGERY  2010   reconstruction    Family History  Problem Relation Age  of Onset   Cerebral aneurysm Mother    Lung cancer Father    Drug abuse Daughter    Bipolar disorder Daughter     Social History   Socioeconomic History   Marital status: Widowed    Spouse name: Not on file   Number of children: Not on file   Years of education: Not on file   Highest education level: Not on file  Occupational History   Occupation: retired  Tobacco Use   Smoking status: Former    Current packs/day: 0.00    Average packs/day: 2.0 packs/day for 50.0 years (100.0 ttl pk-yrs)    Types: Cigarettes    Start date: 1    Quit date: 1995    Years since quitting: 30.9   Smokeless tobacco: Never  Vaping Use   Vaping status: Never Used  Substance and Sexual Activity   Alcohol use: No   Drug use: No   Sexual activity: Not Currently  Other Topics Concern   Not on file  Social History Narrative   Work or School: retire emergency planning/management officer      Home Situation: lives with wife      Spiritual Beliefs: episcopalian      Lifestyle: active with yard work; diet ok   Social Drivers of Corporate Investment Banker Strain: Low Risk  (05/30/2023)   Overall Physicist, Medical Strain (  CARDIA)    Difficulty of Paying Living Expenses: Not very hard  Food Insecurity: Low Risk  (10/05/2023)   Received from [facility]   Hunger Vital Sign    Within the past 12 months, you worried that your food would run out before you got money to buy more: Never true    Within the past 12 months, the food you bought just didn't last and you didn't have money to get more. : Never true  Transportation Needs: No Transportation Needs (10/05/2023)   Received from [facility]   Transportation    In the past 12 months, has lack of reliable transportation kept you from medical appointments, meetings, work or from getting things needed for daily living? : No  Physical Activity: Insufficiently Active (05/30/2023)   Exercise Vital Sign    Days of Exercise per Week: 5 days    Minutes of Exercise per  Session: 20 min  Stress: No Stress Concern Present (05/30/2023)   [employer] of Occupational Health - Occupational Stress Questionnaire    Feeling of Stress : Not at all  Social Connections: Moderately Isolated (05/30/2023)   Social Connection and Isolation Panel    Frequency of Communication with Friends and Family: More than three times a week    Frequency of Social Gatherings with Friends and Family: Once a week    Attends Religious Services: More than 4 times per year    Active Member of [employer] or Organizations: No    Attends Banker Meetings: Never    Marital Status: Widowed  Intimate Partner Violence: Not At Risk (03/15/2022)   Humiliation, Afraid, Rape, and Kick questionnaire    Fear of Current or Ex-Partner: No    Emotionally Abused: No    Physically Abused: No    Sexually Abused: No    ROS All ROS negative except what is listed in the HPI.   Objective:   Today's Vitals: BP 131/71   Pulse 70   Ht 6' 3 (1.905 m)   Wt 188 lb (85.3 kg)   SpO2 98%   BMI 23.50 kg/m   Physical Exam Vitals reviewed.  Constitutional:      Appearance: Normal appearance.  Cardiovascular:     Rate and Rhythm: Normal rate and regular rhythm.  Pulmonary:     Effort: Pulmonary effort is normal.     Breath sounds: Normal breath sounds.  Skin:    General: Skin is warm and dry.  Neurological:     Mental Status: He is alert and oriented to person, place, and time.  Psychiatric:        Mood and Affect: Mood normal.        Behavior: Behavior normal.        Thought Content: Thought content normal.        Judgment: Judgment normal.         Assessment & Plan:   Problem List Items Addressed This Visit   None Visit Diagnoses       Hospital discharge follow-up    -  Primary     Generalized weakness     Recent fatigue and weakness resolved. Extensive workup normal. Continue with plan to get PT evaluation in a few weeks. Safety discussed. No repeat labs needed today.  Pacemaker remote check scheduled.         Follow-up: Return for keep next scheduled appts.   Waddell FURY Almarie, DNP, FNP-C  I,Emily Lagle,acting as a neurosurgeon for Waddell KATHEE Almarie, NP.,have documented all  relevant documentation on the behalf of Waddell KATHEE Mon, NP.  I, Waddell KATHEE Mon, NP, have reviewed all documentation for this visit. The documentation on 12/07/2023 for the exam, diagnosis, procedures, and orders are all accurate and complete.

## 2023-12-20 NOTE — Therapy (Signed)
 OUTPATIENT PHYSICAL THERAPY PARKINSON'S DISEASE SCREEN   Patient Name: Victor Osborne MRN: [id number] DOB:1932-11-09, 89 y.o., male Today's Date: 12/26/2023   PT End of Session - 12/26/23 1145     Visit Number 0    PT Start Time 1145    PT Stop Time 1217    PT Time Calculation (min) 32 min    Activity Tolerance Patient tolerated treatment well    Behavior During Therapy WFL for tasks assessed/performed          Past Medical History:  Diagnosis Date   Bipolar 1 disorder (HCC)    COPD (chronic obstructive pulmonary disease) (HCC)    Depression    First degree AV block    Hypertension    pt denies but was in medical record   Hypoglycemia, unspecified    Myalgia and myositis, unspecified    Obesity    Pneumonia 1970   Skin cancer (melanoma) (HCC)    Thyroid  disease    Unspecified hypothyroidism    UPJ obstruction, congenital    Past Surgical History:  Procedure Laterality Date   APPENDECTOMY  1939   malignant melanoma removed from right forehead  2008   PERMANENT PACEMAKER INSERTION N/A 06/19/2013   Procedure: PERMANENT PACEMAKER INSERTION;  Surgeon: Danelle LELON Birmingham, MD;  Location: [facility] CATH LAB;  Service: Cardiovascular;  Laterality: N/A;   PPM GENERATOR CHANGEOUT N/A 09/28/2022   Procedure: PPM GENERATOR CHANGEOUT;  Surgeon: Birmingham Danelle LELON, MD;  Location: [facility] INVASIVE CV LAB;  Service: Cardiovascular;  Laterality: N/A;   right big toe  surgery  2009   TRANSURETHRAL RESECTION OF PROSTATE  02/04/2011   Procedure: TRANSURETHRAL RESECTION OF THE PROSTATE (TURP);  Surgeon: Noretta Ferrara, MD;  Location: [facility] ORS;  Service: Urology;  Laterality: N/A;   URETHRA SURGERY  2010   reconstruction   Patient Active Problem List   Diagnosis Date Noted   Presence of heart assist device (HCC) 09/19/2023   Hyperlipidemia 03/15/2022   TIA (transient ischemic attack) 03/14/2022   History of neoplasm 04/12/2021   Melanocytic nevi of trunk 04/12/2021   Other seborrheic keratosis 04/12/2021    Cerebral embolism with cerebral infarction 03/31/2021   Left leg weakness 03/30/2021   Chronic post-traumatic stress disorder 12/05/2017   Bipolar I disorder, current or most recent episode depressed, in partial remission (HCC) 12/05/2017   CHB (complete heart block) (HCC) 01/27/2017   HTN (hypertension) 01/27/2017   Hypothyroidism 10/11/2016   Parkinson disease (HCC) 09/08/2016   Chronic osteoarthritis 03/20/2015   Bipolar 1 disorder (HCC) 03/20/2015   Tremor 03/20/2015   Pacemaker 09/24/2013   SSS (sick sinus syndrome) (HCC) 06/19/2013   First degree AV block 04/09/2013   Allergic rhinitis 03/17/2013   Obstructive sleep apnea 12/18/2006   COPD mixed type (HCC) 12/18/2006   History of malignant melanoma of skin 12/18/2006    PCP: Jason Leita Repine, FNP (Inactive)  REFERRING PROVIDER:  Evonnie Asberry RAMAN, DO   REFERRING DIAG: G20.A1 (ICD-10-CM) - Parkinson's disease without dyskinesia or fluctuating manifestations (HCC)   RATIONALE FOR EVALUATION AND TREATMENT: Rehabilitation  THERAPY DIAG:  Parkinson's disease without dyskinesia, unspecified whether manifestations fluctuate (HCC)  ONSET DATE: Initial PD diagnosis >15 yrs ago   MOST RECENT PT EPISODE FOR PD: 04/06/2023 - 06/29/2023  NEXT MD VISIT: 03/12/2024   SUBJECTIVE:  SUBJECTIVE STATEMENT: Victor Osborne reports he gets fatigued quite easily, esp after trying to work on his HEP exercises.  Fatigue and weakness make ADLs more difficult and takes more concentration.  Getting up (sit to stand) takes more effort and time to complete.  He denies any falls.  He uses his rollator mostly in his apartment but goes out with just a single hiking pole (lost the 2nd one).  Walking takes a lot of energy.  He note feeling more depressed but not in a  manner where he wants to harm himself - he has an upcoming appointment with his psychiatrist where he intends to discuss the depression.  PAIN:  Are you having pain? No  PERTINENT HISTORY:  PPM 2 sick sinus syndrome/SA node dysfunction & complete heart block, orthostatic hypotension, COPD, OSA, HTN, DM2, Parkinson's Disease, CVA 03/2021, hypothyroidism, PTSD, bipolar I disorder, peripheral neuropathy   PRECAUTIONS: Fall and ICD/Pacemaker   WEIGHT BEARING RESTRICTIONS: No  FALLS:  Has patient fallen in last 6 months? No  LIVING ENVIRONMENT: Lives with: lives alone with his dog Electrical Engineer) Lives in: Independent living facility (3rd floor)  Stairs: Engineer, Structural Has following equipment at home: Single point cane, Environmental Consultant - 4 wheeled, and hiking pole   OCCUPATION: Retired   PLOF: Independent and Leisure: tries to walk more with daily activity but not currently walking for exercise due to fatigue/lack of energy    PATIENT GOALS: To improve my overall energy.   OBJECTIVE:   PHYSICAL THERAPY PARKINSON'S DISEASE SCREEN  TUG - Timed Up and Go test: 12.78 sec with single hiking pole W/o AD: 13.75 sec (Normal), 12.06 (Manual), 12.32 (Cognitive - counting back wards by 1's) Baseline as of D/C from last PT episode: 10.62 sec (Normal), 11.21 sec (Manual), 13.25 sec (Cognitive - unable to complete cognitive task w/o multiple errors)   10 meter walk test: 9.19 sec w/o AD, 9.75 sec with single hiking pole  Baseline as of D/C from last PT episode: 8.50 sec w/o AD, 8.16 sec with hiking pole   Gait speed: 3.54 ft/sec w/o AD, 3.36 ft/sec with hiking pole  Baseline as of D/C from last PT episode: 3.86 ft/sec w/o AD, 4.02 ft/sec with hiking pole   5 time sit to stand test: Only able to complete 3 reps w/o UE assist (21.00 sec); 14.75 sec with single UE assist on R Baseline as of D/C from last PT episode: 14.22 sec  PATIENT EDUCATION:  Education details: PT eval findings, standardized testing results  and interpretation, and recommendation to request orders to start new PT episode Person educated: Patient Education method: Explanation Education comprehension: verbalized understanding   ASSESSMENT / PLAN:  CLINICAL IMPRESSION: Victor Osborne is a 89 y.o. male who was seen today for a 6 month physical therapy screen for Parkinson's disease.  He was recently seen in the ED on 12/04/2023 for evaluation of fatigue and increasing LE weakness over a few week period.  He notes feeling that he is slowing down with decreasing tolerance for ADLs and and awareness of increasing weakness in his extremities.  His gait demonstrates increased flexed posture with reduced reciprocal arm swing.  Screening testing revealing increased time necessary for normal and manual TUGs, decreased gait speed, as well as increased need for UE assist and poor eccentric control on 5xSTS .   PD Screen Recommendations: Patient would benefit from Physical Therapy evaluation due to declining function as described above.    Elijah CHRISTELLA Hidden, PT 12/26/2023, 12:36 PM

## 2023-12-26 ENCOUNTER — Ambulatory Visit: Admitting: Physical Therapy

## 2023-12-26 ENCOUNTER — Other Ambulatory Visit: Payer: Self-pay

## 2023-12-26 ENCOUNTER — Encounter: Payer: Self-pay | Admitting: Physical Therapy

## 2023-12-26 DIAGNOSIS — G20A1 Parkinson's disease without dyskinesia, without mention of fluctuations: Secondary | ICD-10-CM | POA: Insufficient documentation

## 2024-01-01 ENCOUNTER — Ambulatory Visit: Admitting: Physician Assistant

## 2024-01-01 ENCOUNTER — Ambulatory Visit: Payer: Medicare Other

## 2024-01-01 DIAGNOSIS — I495 Sick sinus syndrome: Secondary | ICD-10-CM

## 2024-01-03 ENCOUNTER — Other Ambulatory Visit: Payer: Self-pay

## 2024-01-03 DIAGNOSIS — F3175 Bipolar disorder, in partial remission, most recent episode depressed: Secondary | ICD-10-CM

## 2024-01-03 DIAGNOSIS — F4312 Post-traumatic stress disorder, chronic: Secondary | ICD-10-CM

## 2024-01-03 MED ORDER — FLUOXETINE HCL 10 MG PO CAPS: 10.0000 mg | ORAL_CAPSULE | Freq: Every day | ORAL | 1 refills | Status: AC

## 2024-01-04 LAB — CUP PACEART REMOTE DEVICE CHECK
Battery Remaining Longevity: 126 mo
Battery Remaining Percentage: 100 %
Brady Statistic RA Percent Paced: 91 %
Brady Statistic RV Percent Paced: 100 %
Date Time Interrogation Session: 20251217084000
Implantable Lead Connection Status: 753985
Implantable Lead Connection Status: 753985
Implantable Lead Implant Date: 20150603
Implantable Lead Implant Date: 20150603
Implantable Lead Location: 753859
Implantable Lead Location: 753860
Implantable Lead Model: 4136
Implantable Lead Model: 4137
Implantable Lead Serial Number: 29477746
Implantable Lead Serial Number: 29617326
Implantable Pulse Generator Implant Date: 20240911
Lead Channel Impedance Value: 550 Ohm
Lead Channel Impedance Value: 662 Ohm
Lead Channel Pacing Threshold Amplitude: 0.5 V
Lead Channel Pacing Threshold Amplitude: 2.2 V
Lead Channel Pacing Threshold Pulse Width: 0.4 ms
Lead Channel Pacing Threshold Pulse Width: 0.4 ms
Lead Channel Setting Pacing Amplitude: 2.5 V
Lead Channel Setting Pacing Amplitude: 2.7 V
Lead Channel Setting Pacing Pulse Width: 0.4 ms
Lead Channel Setting Sensing Sensitivity: 2.5 mV
Pulse Gen Serial Number: 664123
Zone Setting Status: 755011

## 2024-01-05 NOTE — Progress Notes (Signed)
 Remote PPM Transmission

## 2024-01-09 ENCOUNTER — Ambulatory Visit: Admitting: Physician Assistant

## 2024-01-11 ENCOUNTER — Other Ambulatory Visit: Payer: Self-pay | Admitting: Family Medicine

## 2024-01-11 DIAGNOSIS — E032 Hypothyroidism due to medicaments and other exogenous substances: Secondary | ICD-10-CM

## 2024-01-14 ENCOUNTER — Ambulatory Visit: Payer: Self-pay | Admitting: Internal Medicine

## 2024-01-15 NOTE — Progress Notes (Signed)
 "   New Patient Office Visit   Subjective     Patient ID: Victor Osborne, male   DOB: 11-19-1932  Age: 89 y.o. MRN: [id number]   CC:  No chief complaint on file.     HPI Victor Osborne presents to establish care and for medical management of chronic conditions.    Hyperlipidemia; History of TIA; Complete Heart Block: - medications: Pravastatin  80 mg daily and Plavix  75 mg daily.  - compliance: *** - medication SEs: *** The ASCVD Risk score (Arnett DK, et al., 2019) failed to calculate for the following reasons:   The 2019 ASCVD risk score is only valid for ages 27 to 12   Risk score cannot be calculated because patient has a medical history suggesting prior/existing ASCVD   * - Cholesterol units were assumed   Hypothyroidism: - Management: Levothyroxine  125 mcg daily.  -Taking medications as prescribed in the morning, apart from other foods, meds, vitamins, etc.  - No recent changes to hair, skin, nails, energy levels. Lab Results  Component Value Date   TSH 2.270 12/04/2023     Parkinson's Disease: - Management: Tegretol  200 mg TID, Sinemet  25-100 mg TID.   Mood follow-up: - Diagnosis: Bipolar Disorder - Treatment: Fluoxetine  10 mg daily and Seroquel  75 mg daily.  - Medication side effects:  - SI/HI:  - Update:    09/19/2023   11:15 AM 05/30/2023   11:20 AM 05/23/2022    1:46 PM 11/16/2021    2:29 PM 11/04/2021   12:58 PM  Depression screen PHQ 2/9  Decreased Interest 0 0 0 0 0  Down, Depressed, Hopeless 0 0 0 0 0  PHQ - 2 Score 0 0 0 0 0  Altered sleeping 0 0     Tired, decreased energy 0 0     Change in appetite 0 0     Feeling bad or failure about yourself  0 0     Trouble concentrating 0 0     Moving slowly or fidgety/restless 0 0     Suicidal thoughts 0 0     PHQ-9 Score 0  0      Difficult doing work/chores Not difficult at all Not difficult at all        Data saved with a previous flowsheet row definition      09/19/2023   11:15 AM   GAD 7 : Generalized Anxiety Score  Nervous, Anxious, on Edge 0  Control/stop worrying 0  Worry too much - different things 0  Trouble relaxing 0  Restless 0  Easily annoyed or irritable 0  Afraid - awful might happen 0  Total GAD 7 Score 0  Anxiety Difficulty Not difficult at all        Show/hide medication list[1] Past Medical History:  Diagnosis Date   Bipolar 1 disorder (HCC)    COPD (chronic obstructive pulmonary disease) (HCC)    Depression    First degree AV block    Hypertension    pt denies but was in medical record   Hypoglycemia, unspecified    Myalgia and myositis, unspecified    Obesity    Pneumonia 1970   Skin cancer (melanoma) (HCC)    Thyroid  disease    Unspecified hypothyroidism    UPJ obstruction, congenital     Past Surgical History:  Procedure Laterality Date   APPENDECTOMY  1939   malignant melanoma removed from right forehead  2008   PERMANENT PACEMAKER INSERTION N/A 06/19/2013  Procedure: PERMANENT PACEMAKER INSERTION;  Surgeon: Danelle LELON Birmingham, MD;  Location: [facility] CATH LAB;  Service: Cardiovascular;  Laterality: N/A;   PPM GENERATOR CHANGEOUT N/A 09/28/2022   Procedure: PPM GENERATOR CHANGEOUT;  Surgeon: Birmingham Danelle LELON, MD;  Location: [facility] INVASIVE CV LAB;  Service: Cardiovascular;  Laterality: N/A;   right big toe  surgery  2009   TRANSURETHRAL RESECTION OF PROSTATE  02/04/2011   Procedure: TRANSURETHRAL RESECTION OF THE PROSTATE (TURP);  Surgeon: Noretta Ferrara, MD;  Location: [facility] ORS;  Service: Urology;  Laterality: N/A;   URETHRA SURGERY  2010   reconstruction     Family History  Problem Relation Age of Onset   Cerebral aneurysm Mother    Lung cancer Father    Drug abuse Daughter    Bipolar disorder Daughter     Social History   Socioeconomic History   Marital status: Widowed    Spouse name: Not on file   Number of children: Not on file   Years of education: Not on file   Highest education level: Not on file  Occupational History    Occupation: retired  Tobacco Use   Smoking status: Former    Current packs/day: 0.00    Average packs/day: 2.0 packs/day for 50.0 years (100.0 ttl pk-yrs)    Types: Cigarettes    Start date: 54    Quit date: 28    Years since quitting: 31.0   Smokeless tobacco: Never  Vaping Use   Vaping status: Never Used  Substance and Sexual Activity   Alcohol use: No   Drug use: No   Sexual activity: Not Currently  Other Topics Concern   Not on file  Social History Narrative   Work or School: retire emergency planning/management officer      Home Situation: lives with wife      Spiritual Beliefs: episcopalian      Lifestyle: active with yard work; diet ok   Social Drivers of Health   Tobacco Use: Medium Risk (12/30/2023)   Received from [facility]   Patient History    Smoking Tobacco Use: Former    Smokeless Tobacco Use: Unknown    Passive Exposure: Not on Actuary Strain: Low Risk (05/30/2023)   Overall Financial Resource Strain (CARDIA)    Difficulty of Paying Living Expenses: Not very hard  Food Insecurity: Low Risk (10/05/2023)   Received from [facility]   Epic    Within the past 12 months, you worried that your food would run out before you got money to buy more: Never true    Within the past 12 months, the food you bought just didn't last and you didn't have money to get more. : Never true  Transportation Needs: No Transportation Needs (10/05/2023)   Received from [facility]    In the past 12 months, has lack of reliable transportation kept you from medical appointments, meetings, work or from getting things needed for daily living? : No  Physical Activity: Insufficiently Active (05/30/2023)   Exercise Vital Sign    Days of Exercise per Week: 5 days    Minutes of Exercise per Session: 20 min  Stress: No Stress Concern Present (05/30/2023)   [employer] of Occupational Health - Occupational Stress Questionnaire    Feeling of Stress : Not at all   Social Connections: Moderately Isolated (05/30/2023)   Social Connection and Isolation Panel    Frequency of Communication with Friends and Family: More than three times a week  Frequency of Social Gatherings with Friends and Family: Once a week    Attends Religious Services: More than 4 times per year    Active Member of [employer] or Organizations: No    Attends Banker Meetings: Never    Marital Status: Widowed  Depression (PHQ2-9): Low Risk (09/19/2023)   Depression (PHQ2-9)    PHQ-2 Score: 0  Alcohol Screen: Low Risk (05/30/2023)   Alcohol Screen    Last Alcohol Screening Score (AUDIT): 0  Housing: Low Risk (10/05/2023)   Received from [facility]   Epic    What is your living situation today?: I have a steady place to live    Think about the place you live. Do you have problems with any of the following? Choose all that apply:: None/None on this list  Utilities: Low Risk (10/05/2023)   Received from [facility]   Utilities    In the past 12 months has the electric, gas, oil, or water  company threatened to shut off services in your home? : No  Health Literacy: Adequate Health Literacy (05/30/2023)   B1300 Health Literacy    Frequency of need for help with medical instructions: Never       ROS All review of systems negative except what is listed in the HPI    Objective     There were no vitals taken for this visit.  Physical Exam     Assessment & Plan:     Problem List Items Addressed This Visit   None            No follow-ups on file.  Waddell KATHEE Mon, NP  I,Emily Lagle,acting as a scribe for Waddell KATHEE Mon, NP.,have documented all relevant documentation on the behalf of Waddell KATHEE Mon, NP.  I, Waddell KATHEE Mon, NP, have reviewed all documentation for this visit. The documentation on 01/19/2024 for the exam, diagnosis, procedures, and orders are all accurate and complete.    [1]  Outpatient Medications Prior to Visit  Medication Sig   acetaminophen   (TYLENOL ) 325 MG tablet Take 650 mg by mouth every 6 (six) hours as needed for mild pain or headache.   alclomethasone (ACLOVATE) 0.05 % ointment Apply 1 application topically 2 (two) times daily as needed (for rash).   carbamazepine  (TEGRETOL ) 200 MG tablet TAKE 1 TABLET BY MOUTH 3 TIMES DAILY.   carbidopa -levodopa  (SINEMET  IR) 25-100 MG tablet Take 2 tablets by mouth 3 (three) times daily.   cholecalciferol  (VITAMIN D) 1000 UNITS tablet Take 2,000 Units by mouth daily.   clopidogrel  (PLAVIX ) 75 MG tablet Take 1 tablet (75 mg total) by mouth daily.   Coenzyme Q10 (CO Q 10 PO) Take 1 capsule by mouth daily.   ferrous sulfate  324 (65 Fe) MG TBEC Take 324 mg by mouth daily.   Flaxseed, Linseed, (FLAX SEED OIL) 1300 MG CAPS Take 1 capsule by mouth daily.   FLUoxetine  (PROZAC ) 10 MG capsule Take 1 capsule (10 mg total) by mouth daily.   Fluticasone -Umeclidin-Vilant (TRELEGY ELLIPTA ) 100-62.5-25 MCG/ACT AEPB Inhale 1 puff into the lungs daily. (Patient not taking: Reported on 12/26/2023)   ketoconazole (NIZORAL) 2 % cream Apply 1 Application topically daily.   levothyroxine  (SYNTHROID ) 125 MCG tablet Take 1 tablet (125 mcg total) by mouth daily before breakfast.   MAGNESIUM PO Take 1 capsule by mouth daily.   Multiple Vitamin (MULITIVITAMIN WITH MINERALS) TABS Take 1 tablet by mouth daily.   Omega-3 Fatty Acids (OMEGA-3 CF PO) Take 1,200 mg by mouth  daily.   POTASSIUM GLUCONATE PO Take 1 capsule by mouth daily.   pravastatin  (PRAVACHOL ) 80 MG tablet Take 1 tablet (80 mg total) by mouth daily.   QUEtiapine  (SEROQUEL  XR) 50 MG TB24 24 hr tablet Take 1 tablet (50 mg total) by mouth at bedtime.   QUEtiapine  (SEROQUEL ) 25 MG tablet Take 1 tablet (25 mg total) by mouth at bedtime. Take with Seroquel  XR 50 mg.   tamsulosin  (FLOMAX ) 0.4 MG CAPS capsule Take 0.4 mg by mouth at bedtime.   triamcinolone ointment (KENALOG) 0.1 % Apply 1 Application topically 2 (two) times daily.   No facility-administered  medications prior to visit.   "

## 2024-01-19 ENCOUNTER — Ambulatory Visit: Admitting: Family Medicine

## 2024-01-19 ENCOUNTER — Encounter: Payer: Self-pay | Admitting: Family Medicine

## 2024-01-19 VITALS — BP 139/62 | HR 60 | Ht 75.0 in | Wt 187.0 lb

## 2024-01-19 DIAGNOSIS — J449 Chronic obstructive pulmonary disease, unspecified: Secondary | ICD-10-CM

## 2024-01-19 DIAGNOSIS — G459 Transient cerebral ischemic attack, unspecified: Secondary | ICD-10-CM

## 2024-01-19 DIAGNOSIS — R531 Weakness: Secondary | ICD-10-CM

## 2024-01-19 DIAGNOSIS — F3175 Bipolar disorder, in partial remission, most recent episode depressed: Secondary | ICD-10-CM | POA: Diagnosis not present

## 2024-01-19 DIAGNOSIS — E032 Hypothyroidism due to medicaments and other exogenous substances: Secondary | ICD-10-CM

## 2024-01-19 DIAGNOSIS — E785 Hyperlipidemia, unspecified: Secondary | ICD-10-CM

## 2024-01-19 DIAGNOSIS — I951 Orthostatic hypotension: Secondary | ICD-10-CM

## 2024-01-19 DIAGNOSIS — G20A1 Parkinson's disease without dyskinesia, without mention of fluctuations: Secondary | ICD-10-CM

## 2024-01-19 NOTE — Assessment & Plan Note (Signed)
 Following with neurology  Stable On Tegretol  and Sinemet 

## 2024-01-19 NOTE — Assessment & Plan Note (Signed)
 On Plavix . Following with neurology and cardiology. No new symptoms.

## 2024-01-19 NOTE — Assessment & Plan Note (Signed)
 Previously well controlled Continue Synthroid  at current dose  Declined labs today. Check at next visit.

## 2024-01-19 NOTE — Assessment & Plan Note (Signed)
 Mood slightly depressed, affecting appetite and energy. - Continue fluoxetine  and Seroquel . - Follow up with psychiatrist on January 20th, 2026.

## 2024-01-19 NOTE — Assessment & Plan Note (Signed)
 Medication management: pravastatin   Lifestyle factors for lowering cholesterol include: Diet therapy - heart-healthy diet rich in fruits, veggies, fiber-rich whole grains, lean meats, chicken, fish (at least twice a week), fat-free or 1% dairy products; foods low in saturated/trans fats, cholesterol, sodium, and sugar. Mediterranean diet has shown to be very heart healthy. Regular exercise - recommend at least 30 minutes a day, 5 times per week Weight management

## 2024-01-22 NOTE — Therapy (Signed)
 " OUTPATIENT PHYSICAL THERAPY PARKINSON'S EVALUATION   Patient Name: Kemal Amores MRN: [id number] DOB:Dec 20, 1932, 89 y.o., male Today's Date: 01/23/2024   END OF SESSION:  PT End of Session - 01/23/24 1054     Visit Number 1    Date for Recertification  03/19/24    Authorization Type Medicare & [employer]    Progress Note Due on Visit 10    PT Start Time 1054    PT Stop Time 1149    PT Time Calculation (min) 55 min    Activity Tolerance Patient tolerated treatment well    Behavior During Therapy WFL for tasks assessed/performed          Past Medical History:  Diagnosis Date   Bipolar 1 disorder (HCC)    COPD (chronic obstructive pulmonary disease) (HCC)    Depression    First degree AV block    Hypertension    pt denies but was in medical record   Hypoglycemia, unspecified    Myalgia and myositis, unspecified    Obesity    Pneumonia 1970   Skin cancer (melanoma) (HCC)    Thyroid  disease    Unspecified hypothyroidism    UPJ obstruction, congenital    Past Surgical History:  Procedure Laterality Date   APPENDECTOMY  1939   malignant melanoma removed from right forehead  2008   PERMANENT PACEMAKER INSERTION N/A 06/19/2013   Procedure: PERMANENT PACEMAKER INSERTION;  Surgeon: Danelle LELON Birmingham, MD;  Location: [facility] CATH LAB;  Service: Cardiovascular;  Laterality: N/A;   PPM GENERATOR CHANGEOUT N/A 09/28/2022   Procedure: PPM GENERATOR CHANGEOUT;  Surgeon: Birmingham Danelle LELON, MD;  Location: [facility] INVASIVE CV LAB;  Service: Cardiovascular;  Laterality: N/A;   right big toe  surgery  2009   TRANSURETHRAL RESECTION OF PROSTATE  02/04/2011   Procedure: TRANSURETHRAL RESECTION OF THE PROSTATE (TURP);  Surgeon: Noretta Ferrara, MD;  Location: [facility] ORS;  Service: Urology;  Laterality: N/A;   URETHRA SURGERY  2010   reconstruction   Patient Active Problem List   Diagnosis Date Noted   Hyperlipidemia 03/15/2022   TIA (transient ischemic attack) 03/14/2022   History of neoplasm  04/12/2021   Melanocytic nevi of trunk 04/12/2021   Other seborrheic keratosis 04/12/2021   Cerebral embolism with cerebral infarction 03/31/2021   Left leg weakness 03/30/2021   Chronic post-traumatic stress disorder 12/05/2017   Bipolar I disorder, current or most recent episode depressed, in partial remission (HCC) 12/05/2017   CHB (complete heart block) (HCC) 01/27/2017   HTN (hypertension) 01/27/2017   Hypothyroidism 10/11/2016   Parkinson disease (HCC) 09/08/2016   Chronic osteoarthritis 03/20/2015   Bipolar 1 disorder (HCC) 03/20/2015   Tremor 03/20/2015   Pacemaker 09/24/2013   SSS (sick sinus syndrome) (HCC) 06/19/2013   First degree AV block 04/09/2013   Allergic rhinitis 03/17/2013   Obstructive sleep apnea 12/18/2006   COPD mixed type (HCC) 12/18/2006   History of malignant melanoma of skin 12/18/2006    PCP: Almarie Birmingham NOVAK, NP   REFERRING PROVIDER: Evonnie Asberry RAMAN, DO   REFERRING DIAG: G20.A1 (ICD-10-CM) - Parkinson's disease without dyskinesia or fluctuating manifestations (HCC)   THERAPY DIAG:  Parkinson's disease without dyskinesia or fluctuating manifestations (HCC)  Other abnormalities of gait and mobility  Unsteadiness on feet  Abnormal posture  Muscle weakness (generalized)  RATIONALE FOR EVALUATION AND TREATMENT: Rehabilitation  ONSET DATE: Initial PD diagnosis >15 yrs ago   NEXT MD VISIT:  03/12/2024    SUBJECTIVE:  SUBJECTIVE STATEMENT: Tremar Wickens reports he gets fatigued quite easily.  Fatigue and weakness make ADLs more difficult and takes more concentration.  Getting up (sit to stand) takes more effort and time to complete.  He denies any falls.  He uses his rollator mostly in his apartment but goes out with just a single hiking pole (lost the  2nd one).  Walking takes a lot of energy.  Does not feel as strong as he was.  PAIN: Are you having pain? No  PERTINENT HISTORY:  PPM 2 sick sinus syndrome/SA node dysfunction & complete heart block, orthostatic hypotension, COPD, OSA, HTN, DM2, Parkinson's Disease, CVA 03/2021, hypothyroidism, PTSD, bipolar I disorder, peripheral neuropathy   PRECAUTIONS: Fall and ICD/Pacemaker  RED FLAGS: None  WEIGHT BEARING RESTRICTIONS: No  FALLS:  Has patient fallen in last 6 months? 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   OBJECTIVE: (objective measures completed at initial evaluation unless otherwise dated)  DIAGNOSTIC FINDINGS:  12/04/23 - CT HEAD WITHOUT CONTRAST CLINICAL HISTORY: generalized weakness   FINDINGS:   BRAIN AND VENTRICLES: No acute hemorrhage. No evidence of acute infarct. No hydrocephalus. No extra-axial collection. No mass effect or midline shift. Mild age related volume loss. Minimal chronic microvascular ischemic changes.   ORBITS: Postsurgical changes of the globes.   SINUSES: Mucosal thickening in the ethmoid sinuses.   SOFT TISSUES AND SKULL: No acute soft tissue abnormality. No skull fracture.   IMPRESSION: 1. No acute intracranial abnormality.  COGNITION: Overall cognitive status: Within functional limits for tasks assessed, but he feels like his memory is sliding some.   SENSATION: WFL Except neuropathy in his feet - most notable at night   COORDINATION: Gross motor WFL B UE/LE   POSTURE:  rounded shoulders, forward  head, and flexed trunk   UPPER EXTREMITY MMT:  MMT Right eval Left eval  Shoulder flexion    Shoulder extension    Shoulder abduction    Shoulder adduction    Shoulder extension    Shoulder internal rotation    Shoulder external rotation    Middle trapezius    Lower trapezius    Elbow flexion    Elbow extension    Wrist flexion    Wrist extension    Wrist ulnar deviation    Wrist radial deviation    Wrist pronation 4+ 5  Wrist supination 4+ 5  Grip strength 43.33# 56#   (Blank rows = not tested)   MUSCLE LENGTH: Hamstrings: mod tight B ITB: mild/mod tight B Piriformis: mod tight B Hip flexors: severe tight B Quads: mod tight B Heelcord: mod tight L, mild tight R   LOWER EXTREMITY ROM:    Limited related to muscle tightness as above - greatest limitation in B hip extension  LOWER EXTREMITY MMT:    MMT Right eval Left eval  Hip flexion 4+ 4+  Hip extension 4- ^ 4- ^  Hip abduction 4- 4-  Hip adduction 4 4  Hip internal rotation 4+ 4+  Hip external rotation 4 4  Knee flexion 5 5  Knee extension 5 5  Ankle  dorsiflexion 4+ 4+  Ankle plantarflexion    Ankle inversion    Ankle eversion    (Blank rows = not tested)  BED MOBILITY:  Sit to supine Modified independence Supine to sit Modified independence Rolling to Right Modified independence Rolling to Left Modified independence  TRANSFERS: Assistive device utilized: Single hiking pole and None  Sit to stand: Modified independence Stand to sit: Modified independence Chair to chair: Modified independence Floor: NT  GAIT: Distance walked: Clinic distances Assistive device utilized: Single hiking pole on R Level of assistance: Modified independence Gait pattern: step through pattern, decreased arm swing- Right, decreased arm swing- Left, and trunk flexed Comments: Patient reports he has been using his 4WW/rollator mostly in his apartment and single hiking pole or one of his canes when he goes  out.  STAIRS:  Level of Assistance:   Stair Negotiation Technique:    Number of Stairs:    Height of Stairs:   Comments:   FUNCTIONAL TESTS:  5 times sit to stand: 23.06 sec w/o UE assist Timed up and go (TUG): 12.63 sec with single hiking pole W/o AD: 13.10 sec (Normal), 12.16 sec (Manual), 16.97 sec (Cognitive - counting back wards by 2's) 10 meter walk test: 10.38 sec w/o AD; 10.79 sec with single hiking pole Gait speed = 3.16 ft/sec w/o AD; 3.04 ft/sec with single hiking pole  12/26/23 - PD Screen: 5 time sit to stand test: Only able to complete 3 reps w/o UE assist (21.00 sec); 14.75 sec with single UE assist on R Baseline as of D/C from last PT episode: 14.22 sec TUG - Timed Up and Go test: 12.78 sec with single hiking pole W/o AD: 13.75 sec (Normal), 12.06 sec (Manual), 12.32 sec(Cognitive - counting back wards by 1's) Baseline as of D/C from last PT episode: 10.62 sec (Normal), 11.21 sec (Manual), 13.25 sec (Cognitive - unable to complete cognitive task w/o multiple errors)  10 meter walk test: 9.19 sec w/o AD, 9.75 sec with single hiking pole  Baseline as of D/C from last PT episode: 8.50 sec w/o AD, 8.16 sec with hiking pole  Gait speed: 3.54 ft/sec w/o AD, 3.36 ft/sec with hiking pole  Baseline as of D/C from last PT episode: 3.86 ft/sec w/o AD, 4.02 ft/sec with hiking pole    06/29/23 - DC from last PT episode: 5xSTS = 14.22 sec TUG): 10.62 sec (Normal), 11.21 sec (Manual), 13.25 sec (Cognitive - unable to complete cognitive task w/o multiple errors) = 8.50 sec w/o AD, 8.16 sec with hiking pole Gait speed = 3.86 ft/sec w/o AD, 4.02 ft/sec with hiking pole Berg = 51/56; 46-51 indicates moderate risk for falls (>50%)  FGA = 24/30; 19-24 indicates medium risk fall   PATIENT SURVEYS:  ABC scale: The Activities-Specific Balance Confidence (ABC) Scale 0% 10 20 30  40 50 60 70 80 90 100% No confidence<->completely confident  How confident are you that you will not lose  your balance or become unsteady when you . . .   Date tested 01/23/2024   Walk around the house 70%  2. Walk up or down stairs 70%  3. Bend over and pick up a slipper from in front of a closet floor 70%  4. Reach for a small can off a shelf at eye level 70%  5. Stand on tip toes and reach for something above your head 50%  6. Stand on a chair and reach for something 20%  7. Sweep the floor 70%  8. Walk  outside the house to a car parked in the driveway 70%  9. Get into or out of a car 70%  10. Walk across a parking lot to the mall 70%  11. Walk up or down a ramp 60%  12. Walk in a crowded mall where people rapidly walk past you 70%  13. Are bumped into by people as you walk through the mall 60%  14. Step onto or off of an escalator while you are holding onto the railing 70%  15. Step onto or off an escalator while holding onto parcels such that you cannot hold onto the railing 60%  16. Walk outside on icy sidewalks 60%  Total: #/16  63.1 %     06/29/23 - DC from last PT episode: 1260 / 1600 = 78.8 %    TODAY'S TREATMENT:    01/23/2024 - Eval SELF CARE:  Reviewed eval findings and role of PT in addressing identified deficits as well as instruction in initial HEP (see below).    PATIENT EDUCATION:  Education details: PT eval findings, anticipated POC, need for further assessment of Berg and FGA, standardized testing results and interpretation, and initial HEP  Person educated: Patient Education method: Explanation, Demonstration, Verbal cues, Tactile cues, and Handouts Education comprehension: verbalized understanding, returned demonstration, verbal cues required, tactile cues required, and needs further education  HOME EXERCISE PROGRAM: Access Code: [email or web address] URL: [email or web address] Date: 01/23/2024 Prepared by: Elijah Hidden  Exercises - Hip Flexor Stretch with Strap on Table  - 2 x daily - 7 x weekly - 3 reps - 30 sec hold - Supine Hamstring Stretch with Strap   - 2 x daily - 7 x weekly - 3 reps - 30 sec hold - Supine Piriformis Stretch with Foot on Ground (Mirrored)  - 2 x daily - 7 x weekly - 3 reps - 30 sec hold - Supine Figure 4 Piriformis Stretch  - 2 x daily - 7 x weekly - 3 reps - 30 sec hold   ASSESSMENT:  CLINICAL IMPRESSION: Dalyn Becker is a 89 y.o. male who was referred to physical therapy for evaluation and treatment for Parkinson's disease. Patient first diagnosed with Parkinson's >15 years ago.  Most recent PT episode for Parkinson's disease was 04/06/2023 - 06/29/2023.  Since his last PT episode, he reports increased weakness and fatigue impacting ADLs and ambulation tolerance.  Patient presents with physical impairments of decreased timing and coordination of gait, impaired ambulation, impaired standing balance, abnormal posture, bradykinesia with transfers, impaired activity tolerance, LE weakness, postural instability and decreased safety awareness impacting safe and independent functional mobility.  Examination revealed patient is at risk for falls and functional decline as evidenced by the following objective test measures: 5xSTS of 23.06 sec (>15 sec indicates increased risk for falls and decreased BLE power), Gait speed 3.16 ft/sec w/o AD and 3.04 ft/sec with single hiking pole (3.94 ft/sec is needed for safe community ambulation when crossing streets), TUG of 13.10 sec (>13.5 sec indicates increased risk for falls), TUG Manual of 12.16 sec (difference between TUG manual and TUG >4.5 seconds indicates increased fall risk), and TUG cognitive of 16.97 sec (>/= 15 seconds indicates high risk for falls and community dwelling older adults).  TUG scores of >10% difference indicate difficulty with dual tasking.  Further testing of balance with Berg and FGA to be completed next visit.  ABC scale score of 63.1% indicates a moderate level of physical functioning, with scores of <69% indicating risk for recurrent falls in  PD.  Elsie Peter will  benefit from skilled PT to address above deficits to improve mobility and activity tolerance to help reach the maximal level of functional independence with mobility and gait with reduced risk for falls.  Patient demonstrates understanding of this POC and is in agreement with this plan.   OBJECTIVE IMPAIRMENTS: Abnormal gait, decreased activity tolerance, decreased balance, decreased coordination, decreased endurance, decreased knowledge of use of DME, decreased mobility, difficulty walking, decreased ROM, decreased strength, decreased safety awareness, impaired perceived functional ability, improper body mechanics, and postural dysfunction.   ACTIVITY LIMITATIONS: carrying, lifting, standing, squatting, stairs, transfers, bed mobility, reach over head, and locomotion level  PARTICIPATION LIMITATIONS: meal prep, cleaning, laundry, driving, shopping, and community activity  PERSONAL FACTORS: Age, Past/current experiences, Time since onset of injury/illness/exacerbation, and 3+ comorbidities: PPM 2 sick sinus syndrome/SA node dysfunction & complete heart block, orthostatic hypotension, COPD, OSA, HTN, DM2, Parkinson's Disease, CVA 03/2021, hypothyroidism, PTSD, bipolar I disorder, peripheral neuropathy  are also affecting patient's functional outcome.   REHAB POTENTIAL: Good  CLINICAL DECISION MAKING: Evolving/moderate complexity  EVALUATION COMPLEXITY: Moderate   GOALS: Goals reviewed with patient? Yes  SHORT TERM GOALS: Target date: 02/20/2024  Complete remaining standardized balance assessment and update LTG's accordingly. Baseline: Berg and FGA TBA  Goal status: INITIAL  2.  Patient will be independent with initial HEP. Baseline: HEP initiated on eval Goal status: INITIAL  3.  Patient will demonstrate improved B proximal LE strength to >/= 4+/5 for improved stability and ease of mobility. Baseline: Refer to above LE MMT table Goal status: INITIAL  4.  Patient will improve 5x STS  time to </= 18 seconds to demonstrate improved functional strength and transfer efficiency. Baseline: 23.06 sec Goal status: INITIAL  LONG TERM GOALS: Target date: 03/19/2024   Patient will be independent with ongoing/advanced HEP for self-management at home incorporating PWR! Moves as indicated.  Baseline:  Goal status: INITIAL  2.  Patient will be able to ambulate at least 800 ft with LRAD including outdoor and unlevel surfaces, independently with no LOB, for improved community gait. .  Baseline:  Goal status: INITIAL  3.  Patient will be able to step up/down curb safely with LRAD for safety with community ambulation.  Baseline:  Goal status: INITIAL   4.  Patient will demonstrate at least 4 point improvement on FGA or score of >/= 24/30 (baseline as of DC from last PT episode) to improve gait stability and reduce risk for falls.  Baseline: TBA Goal status: INITIAL  5.  Patient will improve Berg score by at least 8 points or score >/= 51/56 (baseline as of DC from last PT episode) to improve safety and stability with ADLs in standing and reduce risk for falls.   Baseline: TBA Goal status: INITIAL  6.  Patient will report >/= 70% on ABC scale to demonstrate improved balance confidence and decreased risk for falls. Baseline: 1010 / 1600 = 63.1 % Goal status: INITIAL  7. Patient will verbalize understanding of local Parkinson's disease community resources, including community fitness post d/c. Baseline:  Goal status: INITIAL   PLAN:  PT FREQUENCY: 2x/week  PT DURATION: 8 weeks  PLANNED INTERVENTIONS: [id number]- PT Re-evaluation, 97750- Physical Performance Testing, 97110-Therapeutic exercises, 97530- Therapeutic activity, [id number]- Neuromuscular re-education, 97535- Self Care, [id number]- Manual therapy, [id number]- Gait training, Patient/Family education, Balance training, Stair training, DME instructions, Cryotherapy, and Moist heat  PLAN FOR NEXT SESSION: Complete standardized balance  assessment - Berg and FGA; review initial HEP for  proximal LE flexibility and update modify as indicated; initiate core/proximal LE strengthening; initiate PWR! Moves    [place], [place] 01/23/2024, 6:33 PM  "

## 2024-01-23 ENCOUNTER — Encounter: Payer: Self-pay | Admitting: Physical Therapy

## 2024-01-23 ENCOUNTER — Other Ambulatory Visit: Payer: Self-pay

## 2024-01-23 ENCOUNTER — Ambulatory Visit: Attending: Neurology | Admitting: Physical Therapy

## 2024-01-23 DIAGNOSIS — R2689 Other abnormalities of gait and mobility: Secondary | ICD-10-CM | POA: Diagnosis present

## 2024-01-23 DIAGNOSIS — G20A1 Parkinson's disease without dyskinesia, without mention of fluctuations: Secondary | ICD-10-CM | POA: Diagnosis present

## 2024-01-23 DIAGNOSIS — M6281 Muscle weakness (generalized): Secondary | ICD-10-CM | POA: Insufficient documentation

## 2024-01-23 DIAGNOSIS — R293 Abnormal posture: Secondary | ICD-10-CM | POA: Insufficient documentation

## 2024-01-23 DIAGNOSIS — R2681 Unsteadiness on feet: Secondary | ICD-10-CM | POA: Diagnosis present

## 2024-01-24 NOTE — Progress Notes (Signed)
 COVID and flu ordered to assess for upper respiratory tract infection

## 2024-01-30 ENCOUNTER — Ambulatory Visit: Admitting: Physical Therapy

## 2024-01-31 ENCOUNTER — Ambulatory Visit: Admitting: Physical Therapy

## 2024-01-31 ENCOUNTER — Encounter: Payer: Self-pay | Admitting: Physical Therapy

## 2024-01-31 DIAGNOSIS — R2689 Other abnormalities of gait and mobility: Secondary | ICD-10-CM

## 2024-01-31 DIAGNOSIS — M6281 Muscle weakness (generalized): Secondary | ICD-10-CM

## 2024-01-31 DIAGNOSIS — G20A1 Parkinson's disease without dyskinesia, without mention of fluctuations: Secondary | ICD-10-CM | POA: Diagnosis not present

## 2024-01-31 DIAGNOSIS — R293 Abnormal posture: Secondary | ICD-10-CM

## 2024-01-31 DIAGNOSIS — R2681 Unsteadiness on feet: Secondary | ICD-10-CM

## 2024-01-31 NOTE — Therapy (Signed)
 " OUTPATIENT PHYSICAL THERAPY TREATMENT   Patient Name: Victor Osborne MRN: [id number] DOB:Aug 24, 1932, 89 y.o., male Today's Date: 01/31/2024   END OF SESSION:  PT End of Session - 01/31/24 1016     Visit Number 2    Date for Recertification  03/19/24    Authorization Type Medicare & [employer]    Progress Note Due on Visit 10    PT Start Time 1016    PT Stop Time 1101    PT Time Calculation (min) 45 min    Activity Tolerance Patient tolerated treatment well    Behavior During Therapy WFL for tasks assessed/performed           Past Medical History:  Diagnosis Date   Bipolar 1 disorder (HCC)    COPD (chronic obstructive pulmonary disease) (HCC)    Depression    First degree AV block    Hypertension    pt denies but was in medical record   Hypoglycemia, unspecified    Myalgia and myositis, unspecified    Obesity    Pneumonia 1970   Skin cancer (melanoma) (HCC)    Thyroid  disease    Unspecified hypothyroidism    UPJ obstruction, congenital    Past Surgical History:  Procedure Laterality Date   APPENDECTOMY  1939   malignant melanoma removed from right forehead  2008   PERMANENT PACEMAKER INSERTION N/A 06/19/2013   Procedure: PERMANENT PACEMAKER INSERTION;  Surgeon: Danelle LELON Birmingham, MD;  Location: [facility] CATH LAB;  Service: Cardiovascular;  Laterality: N/A;   PPM GENERATOR CHANGEOUT N/A 09/28/2022   Procedure: PPM GENERATOR CHANGEOUT;  Surgeon: Birmingham Danelle LELON, MD;  Location: [facility] INVASIVE CV LAB;  Service: Cardiovascular;  Laterality: N/A;   right big toe  surgery  2009   TRANSURETHRAL RESECTION OF PROSTATE  02/04/2011   Procedure: TRANSURETHRAL RESECTION OF THE PROSTATE (TURP);  Surgeon: Noretta Ferrara, MD;  Location: [facility] ORS;  Service: Urology;  Laterality: N/A;   URETHRA SURGERY  2010   reconstruction   Patient Active Problem List   Diagnosis Date Noted   Hyperlipidemia 03/15/2022   TIA (transient ischemic attack) 03/14/2022   History of neoplasm 04/12/2021    Melanocytic nevi of trunk 04/12/2021   Other seborrheic keratosis 04/12/2021   Cerebral embolism with cerebral infarction 03/31/2021   Left leg weakness 03/30/2021   Chronic post-traumatic stress disorder 12/05/2017   Bipolar I disorder, current or most recent episode depressed, in partial remission (HCC) 12/05/2017   CHB (complete heart block) (HCC) 01/27/2017   HTN (hypertension) 01/27/2017   Hypothyroidism 10/11/2016   Parkinson disease (HCC) 09/08/2016   Chronic osteoarthritis 03/20/2015   Bipolar 1 disorder (HCC) 03/20/2015   Tremor 03/20/2015   Pacemaker 09/24/2013   SSS (sick sinus syndrome) (HCC) 06/19/2013   First degree AV block 04/09/2013   Allergic rhinitis 03/17/2013   Obstructive sleep apnea 12/18/2006   COPD mixed type (HCC) 12/18/2006   History of malignant melanoma of skin 12/18/2006    PCP: Almarie Birmingham NOVAK, NP   REFERRING PROVIDER: Evonnie Asberry RAMAN, DO   REFERRING DIAG: G20.A1 (ICD-10-CM) - Parkinson's disease without dyskinesia or fluctuating manifestations (HCC)   THERAPY DIAG:  Parkinson's disease without dyskinesia or fluctuating manifestations (HCC)  Other abnormalities of gait and mobility  Unsteadiness on feet  Abnormal posture  Muscle weakness (generalized)  RATIONALE FOR EVALUATION AND TREATMENT: Rehabilitation  ONSET DATE: Initial PD diagnosis >15 yrs ago   NEXT MD VISIT:  03/12/2024    SUBJECTIVE:  SUBJECTIVE STATEMENT: Pt reports he was able to try the HEP exercises a few times.  EVAL: Hikaru Delorenzo reports he gets fatigued quite easily.  Fatigue and weakness make ADLs more difficult and takes more concentration.  Getting up (sit to stand) takes more effort and time to complete.  He denies any falls.  He uses his rollator mostly in his  apartment but goes out with just a single hiking pole (lost the 2nd one).  Walking takes a lot of energy.  Does not feel as strong as he was.  PAIN: Are you having pain? No  PERTINENT HISTORY:  PPM 2 sick sinus syndrome/SA node dysfunction & complete heart block, orthostatic hypotension, COPD, OSA, HTN, DM2, Parkinson's Disease, CVA 03/2021, hypothyroidism, PTSD, bipolar I disorder, peripheral neuropathy   PRECAUTIONS: Fall and ICD/Pacemaker  RED FLAGS: None  WEIGHT BEARING RESTRICTIONS: No  FALLS:  Has patient fallen in last 6 months? 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   OBJECTIVE: (objective measures completed at initial evaluation unless otherwise dated)  DIAGNOSTIC FINDINGS:  12/04/23 - CT HEAD WITHOUT CONTRAST CLINICAL HISTORY: generalized weakness   FINDINGS:   BRAIN AND VENTRICLES: No acute hemorrhage. No evidence of acute infarct. No hydrocephalus. No extra-axial collection. No mass effect or midline shift. Mild age related volume loss. Minimal chronic microvascular ischemic changes.   ORBITS: Postsurgical changes of the globes.   SINUSES: Mucosal thickening in the ethmoid sinuses.   SOFT TISSUES AND SKULL: No acute soft tissue abnormality. No skull fracture.   IMPRESSION: 1. No acute intracranial abnormality.  COGNITION: Overall cognitive status: Within functional limits for tasks assessed, but he feels like his memory is sliding some.   SENSATION: WFL Except neuropathy in his feet - most notable at night   COORDINATION: Gross  motor WFL B UE/LE   POSTURE:  rounded shoulders, forward head, and flexed trunk   UPPER EXTREMITY MMT:  MMT Right eval Left eval  Shoulder flexion    Shoulder extension    Shoulder abduction    Shoulder adduction    Shoulder extension    Shoulder internal rotation    Shoulder external rotation    Middle trapezius    Lower trapezius    Elbow flexion    Elbow extension    Wrist flexion    Wrist extension    Wrist ulnar deviation    Wrist radial deviation    Wrist pronation 4+ 5  Wrist supination 4+ 5  Grip strength 43.33# 56#   (Blank rows = not tested)   MUSCLE LENGTH: Hamstrings: mod tight B ITB: mild/mod tight B Piriformis: mod tight B Hip flexors: severe tight B Quads: mod tight B Heelcord: mod tight L, mild tight R   LOWER EXTREMITY ROM:    Limited related to muscle tightness as above - greatest limitation in B hip extension  LOWER EXTREMITY MMT:    MMT Right eval Left eval  Hip flexion 4+ 4+  Hip extension 4- ^ 4- ^  Hip abduction 4- 4-  Hip adduction 4 4  Hip internal rotation 4+ 4+  Hip external  rotation 4 4  Knee flexion 5 5  Knee extension 5 5  Ankle dorsiflexion 4+ 4+  Ankle plantarflexion    Ankle inversion    Ankle eversion    (Blank rows = not tested)  BED MOBILITY:  Sit to supine Modified independence Supine to sit Modified independence Rolling to Right Modified independence Rolling to Left Modified independence  TRANSFERS: Assistive device utilized: Single hiking pole and None  Sit to stand: Modified independence Stand to sit: Modified independence Chair to chair: Modified independence Floor: NT  GAIT: Distance walked: Clinic distances Assistive device utilized: Single hiking pole on R Level of assistance: Modified independence Gait pattern: step through pattern, decreased arm swing- Right, decreased arm swing- Left, and trunk flexed Comments: Patient reports he has been using his 4WW/rollator mostly in his apartment and single  hiking pole or one of his canes when he goes out.  STAIRS:  Level of Assistance:   Stair Negotiation Technique:    Number of Stairs:    Height of Stairs:   Comments:   FUNCTIONAL TESTS:  5 times sit to stand: 23.06 sec w/o UE assist Timed up and go (TUG): 12.63 sec with single hiking pole W/o AD: 13.10 sec (Normal), 12.16 sec (Manual), 16.97 sec (Cognitive - counting back wards by 2's) 10 meter walk test: 10.38 sec w/o AD; 10.79 sec with single hiking pole Gait speed = 3.16 ft/sec w/o AD; 3.04 ft/sec with single hiking pole 01/31/24: Berg = 44/56, 37-45 significant (>80%) fall risk FGA = 15/30, < 19 = high risk fall   12/26/23 - PD Screen: 5 time sit to stand test: Only able to complete 3 reps w/o UE assist (21.00 sec); 14.75 sec with single UE assist on R Baseline as of D/C from last PT episode: 14.22 sec TUG - Timed Up and Go test: 12.78 sec with single hiking pole W/o AD: 13.75 sec (Normal), 12.06 sec (Manual), 12.32 sec(Cognitive - counting back wards by 1's) Baseline as of D/C from last PT episode: 10.62 sec (Normal), 11.21 sec (Manual), 13.25 sec (Cognitive - unable to complete cognitive task w/o multiple errors)  10 meter walk test: 9.19 sec w/o AD, 9.75 sec with single hiking pole  Baseline as of D/C from last PT episode: 8.50 sec w/o AD, 8.16 sec with hiking pole  Gait speed: 3.54 ft/sec w/o AD, 3.36 ft/sec with hiking pole  Baseline as of D/C from last PT episode: 3.86 ft/sec w/o AD, 4.02 ft/sec with hiking pole    06/29/23 - DC from last PT episode: 5xSTS = 14.22 sec TUG): 10.62 sec (Normal), 11.21 sec (Manual), 13.25 sec (Cognitive - unable to complete cognitive task w/o multiple errors) = 8.50 sec w/o AD, 8.16 sec with hiking pole Gait speed = 3.86 ft/sec w/o AD, 4.02 ft/sec with hiking pole Berg = 51/56; 46-51 indicates moderate risk for falls (>50%)  FGA = 24/30; 19-24 indicates medium risk fall   PATIENT SURVEYS:  ABC scale: The Activities-Specific Balance  Confidence (ABC) Scale 0% 10 20 30  40 50 60 70 80 90 100% No confidence<->completely confident  How confident are you that you will not lose your balance or become unsteady when you . . .   Date tested 01/23/2024   Walk around the house 70%  2. Walk up or down stairs 70%  3. Bend over and pick up a slipper from in front of a closet floor 70%  4. Reach for a small can off a shelf at eye level 70%  5. Stand on tip toes and reach for something above your head 50%  6. Stand on a chair and reach for something 20%  7. Sweep the floor 70%  8. Walk outside the house to a car parked in the driveway 70%  9. Get into or out of a car 70%  10. Walk across a parking lot to the mall 70%  11. Walk up or down a ramp 60%  12. Walk in a crowded mall where people rapidly walk past you 70%  13. Are bumped into by people as you walk through the mall 60%  14. Step onto or off of an escalator while you are holding onto the railing 70%  15. Step onto or off an escalator while holding onto parcels such that you cannot hold onto the railing 60%  16. Walk outside on icy sidewalks 60%  Total: #/16  63.1 %     06/29/23 - DC from last PT episode: 1260 / 1600 = 78.8 %    TODAY'S TREATMENT:    01/31/2024  PHYSICAL PERFORMANCE TEST or MEASUREMENT:  Berg Balance Test   Sit to Stand Able to stand without using hands and stabilize independently    Standing Unsupported Able to stand safely 2 minutes    Sitting with Back Unsupported but Feet Supported on Floor or Stool Able to sit safely and securely 2 minutes    Stand to Sit Sits safely with minimal use of hands    Transfers Able to transfer safely, minor use of hands    Standing Unsupported with Eyes Closed Able to stand 10 seconds with supervision    Standing Unsupported with Feet Together Able to place feet together independently and stand for 1 minute with supervision    From Standing, Reach Forward with Outstretched Arm Can reach forward >12 cm safely (5)     From Standing Position, Pick up Object from Floor Able to pick up shoe safely and easily    From Standing Position, Turn to Look Behind Over each Shoulder Turn sideways only but maintains balance    Turn 360 Degrees Able to turn 360 degrees safely but slowly    Standing Unsupported, Alternately Place Feet on Step/Stool Able to stand independently and safely and complete 8 steps in 20 seconds    Standing Unsupported, One Foot in Front Able to take small step independently and hold 30 seconds    Standing on One Leg Tries to lift leg/unable to hold 3 seconds but remains standing independently    Total Score 44    Berg comment: 37-45 significant (>80%) fall risk     Functional Gait  Assessment   Gait Level Surface Walks 20 ft in less than 7 sec but greater than 5.5 sec, uses assistive device, slower speed, mild gait deviations, or deviates 6-10 in outside of the 12 in walkway width.    Change in Gait Speed Able to change speed, demonstrates mild gait deviations, deviates 6-10 in outside of the 12 in walkway width, or no gait deviations, unable to achieve a major change in velocity, or uses a change in velocity, or uses an assistive device.    Gait with Horizontal Head Turns Performs head turns with moderate changes in gait velocity, slows down, deviates 10-15 in outside 12 in walkway width but recovers, can continue to walk.    Gait with Vertical Head Turns Performs task with slight change in gait velocity (eg, minor disruption to smooth gait path), deviates 6 - 10 in outside  12 in walkway width or uses assistive device    Gait and Pivot Turn Pivot turns safely in greater than 3 sec and stops with no loss of balance, or pivot turns safely within 3 sec and stops with mild imbalance, requires small steps to catch balance.    Step Over Obstacle Is able to step over one shoe box (4.5 in total height) without changing gait speed. No evidence of imbalance.    Gait with Narrow Base of Support Ambulates less  than 4 steps heel to toe or cannot perform without assistance.    Gait with Eyes Closed Walks 20 ft, slow speed, abnormal gait pattern, evidence for imbalance, deviates 10-15 in outside 12 in walkway width. Requires more than 9 sec to ambulate 20 ft.    Ambulating Backwards Walks 20 ft, slow speed, abnormal gait pattern, evidence for imbalance, deviates 10-15 in outside 12 in walkway width.    Steps Alternating feet, must use rail.    Total Score 15    FGA comment: < 19 = high risk fall       THERAPEUTIC EXERCISE: To improve ROM and flexibility.  Demonstration, verbal and tactile cues throughout for technique.  Mod thomas hip flexor stretch with strap x 30 Hooklying HS stretch with strap x 30 Hooklying figure-4 piriformis stretch with light overpressure x 30 Hooklying KTOS piriformis stretch x 30 Standing lumbar extension at wall leaning on forearms 2 x 30   01/23/2024 - Eval SELF CARE:  Reviewed eval findings and role of PT in addressing identified deficits as well as instruction in initial HEP (see below).    PATIENT EDUCATION:  Education details: standardized testing results and interpretation, HEP review, and HEP update - lumbar extension in standing at wall   Person educated: Patient Education method: Explanation, Demonstration, Verbal cues, Tactile cues, and Handouts Education comprehension: verbalized understanding, returned demonstration, verbal cues required, tactile cues required, and needs further education  HOME EXERCISE PROGRAM: Access Code: [email or web address] URL: [email or web address] Date: 01/31/2024 Prepared by: Elijah Hidden  Exercises - Hip Flexor Stretch with Strap on Table  - 2 x daily - 7 x weekly - 3 reps - 30 sec hold - Supine Hamstring Stretch with Strap  - 2 x daily - 7 x weekly - 3 reps - 30 sec hold - Supine Piriformis Stretch with Foot on Ground (Mirrored)  - 2 x daily - 7 x weekly - 3 reps - 30 sec hold - Supine Figure 4 Piriformis Stretch  - 2  x daily - 7 x weekly - 3 reps - 30 sec hold - Standing Lumbar Extension at Wall - Forearms  - 2 x daily - 7 x weekly - 3 reps - 30 sec hold   ASSESSMENT:  CLINICAL IMPRESSION: Standardized balance testing completed with Berg score of 44/56 indicating a significant (>80%) risk for falls and FGA score of 15/30 indicating a high risk for falls.  Reviewed initial HEP providing clarification of stretches as indicated and adding standing lumbar extension stretch at wall to facilitate improved upright posture for improved stability during gait.  Izaya Netherton will benefit from continued skilled PT to address ongoing flexibility, strength, coordination and balance deficits to improve mobility and activity tolerance with decreased risk for falls.   EVAL: Freeman Borba is a 89 y.o. male who was referred to physical therapy for evaluation and treatment for Parkinson's disease. Patient first diagnosed with Parkinson's >15 years ago.  Most recent PT episode for Parkinson's disease was 04/06/2023 - 06/29/2023.  Since his last PT episode, he reports increased weakness and fatigue impacting ADLs and ambulation tolerance.  Patient presents with physical impairments of decreased timing and coordination of gait, impaired ambulation, impaired standing balance, abnormal posture, bradykinesia with transfers, impaired activity tolerance, LE weakness, postural instability and decreased safety awareness impacting safe and independent functional mobility.  Examination revealed patient is at risk for falls and functional decline as evidenced by the following objective test measures: 5xSTS of 23.06 sec (>15 sec indicates increased risk for falls and decreased BLE power), Gait speed 3.16 ft/sec w/o AD and 3.04 ft/sec with single hiking pole (3.94 ft/sec is needed for safe community ambulation when crossing streets), TUG of 13.10 sec (>13.5 sec indicates increased risk for falls), TUG Manual of 12.16 sec (difference between TUG  manual and TUG >4.5 seconds indicates increased fall risk), and TUG cognitive of 16.97 sec (>/= 15 seconds indicates high risk for falls and community dwelling older adults).  TUG scores of >10% difference indicate difficulty with dual tasking.  Further testing of balance with Berg and FGA to be completed next visit.  ABC scale score of 63.1% indicates a moderate level of physical functioning, with scores of <69% indicating risk for recurrent falls in PD.  Aahan Marques will benefit from skilled PT to address above deficits to improve mobility and activity tolerance to help reach the maximal level of functional independence with mobility and gait with reduced risk for falls.  Patient demonstrates understanding of this POC and is in agreement with this plan.   OBJECTIVE IMPAIRMENTS: Abnormal gait, decreased activity tolerance, decreased balance, decreased coordination, decreased endurance, decreased knowledge of use of DME, decreased mobility, difficulty walking, decreased ROM, decreased strength, decreased safety awareness, impaired perceived functional ability, improper body mechanics, and postural dysfunction.   ACTIVITY LIMITATIONS: carrying, lifting, standing, squatting, stairs, transfers, bed mobility, reach over head, and locomotion level  PARTICIPATION LIMITATIONS: meal prep, cleaning, laundry, driving, shopping, and community activity  PERSONAL FACTORS: Age, Past/current experiences, Time since onset of injury/illness/exacerbation, and 3+ comorbidities: PPM 2 sick sinus syndrome/SA node dysfunction & complete heart block, orthostatic hypotension, COPD, OSA, HTN, DM2, Parkinson's Disease, CVA 03/2021, hypothyroidism, PTSD, bipolar I disorder, peripheral neuropathy  are also affecting patient's functional outcome.   REHAB POTENTIAL: Good  CLINICAL DECISION MAKING: Evolving/moderate complexity  EVALUATION COMPLEXITY: Moderate   GOALS: Goals reviewed with patient? Yes  SHORT TERM GOALS:  Target date: 02/20/2024  Complete remaining standardized balance assessment and update LTG's accordingly. Baseline: Berg and FGA TBA  Goal status: MET - 01/31/24  2.  Patient will be independent with initial HEP. Baseline: HEP initiated on eval Goal status: INITIAL  3.  Patient will demonstrate improved B proximal LE strength to >/= 4+/5 for improved stability and ease of mobility. Baseline: Refer to above LE MMT table Goal status: INITIAL  4.  Patient will improve 5x STS time to </= 18 seconds to demonstrate improved functional strength and transfer efficiency. Baseline: 23.06 sec Goal status: INITIAL  LONG TERM GOALS: Target date: 03/19/2024   Patient will be independent with ongoing/advanced HEP for self-management at home incorporating PWR! Moves as indicated.  Baseline:  Goal status: INITIAL  2.  Patient will be able to ambulate at least 800 ft with LRAD including outdoor and unlevel surfaces, independently with no LOB, for improved community gait. .  Baseline:  Goal status: INITIAL  3.  Patient will be able to step up/down curb safely with LRAD for safety with community ambulation.  Baseline:  Goal  status: INITIAL   4.  Patient will demonstrate at least 4 point improvement on FGA or score of >/= 24/30 (baseline as of DC from last PT episode) to improve gait stability and reduce risk for falls.  Baseline: 01/31/24 - 15/30 Goal status: INITIAL  5.  Patient will improve Berg score by at least 8 points or score >/= 51/56 (baseline as of DC from last PT episode) to improve safety and stability with ADLs in standing and reduce risk for falls.   Baseline: 01/31/24 - 44/56 Goal status: INITIAL  6.  Patient will report >/= 70% on ABC scale to demonstrate improved balance confidence and decreased risk for falls. Baseline: 1010 / 1600 = 63.1 % Goal status: INITIAL  7. Patient will verbalize understanding of local Parkinson's disease community resources, including community fitness  post d/c. Baseline:  Goal status: INITIAL   PLAN:  PT FREQUENCY: 2x/week  PT DURATION: 8 weeks  PLANNED INTERVENTIONS: [id number]- PT Re-evaluation, 97750- Physical Performance Testing, 97110-Therapeutic exercises, 97530- Therapeutic activity, [id number]- Neuromuscular re-education, 97535- Self Care, [id number]- Manual therapy, [phone]- Gait training, Patient/Family education, Balance training, Stair training, DME instructions, Cryotherapy, and Moist heat  PLAN FOR NEXT SESSION: Progress proximal LE flexibility and initiate core/postural and proximal LE strengthening, update/modify HEP as indicated; initiate PWR! Moves    [place], [place] 01/31/2024, 2:05 PM  "

## 2024-02-02 ENCOUNTER — Ambulatory Visit: Admitting: Physical Therapy

## 2024-02-02 DIAGNOSIS — G20A1 Parkinson's disease without dyskinesia, without mention of fluctuations: Secondary | ICD-10-CM | POA: Diagnosis not present

## 2024-02-02 DIAGNOSIS — R2689 Other abnormalities of gait and mobility: Secondary | ICD-10-CM

## 2024-02-02 DIAGNOSIS — R2681 Unsteadiness on feet: Secondary | ICD-10-CM

## 2024-02-02 DIAGNOSIS — M6281 Muscle weakness (generalized): Secondary | ICD-10-CM

## 2024-02-02 DIAGNOSIS — R293 Abnormal posture: Secondary | ICD-10-CM

## 2024-02-02 NOTE — Therapy (Signed)
 " OUTPATIENT PHYSICAL THERAPY TREATMENT   Patient Name: Victor Osborne MRN: [id number] DOB:[birth year], 89 y.o., male Today's Date: 02/02/2024   END OF SESSION:  PT End of Session - 02/02/24 1102     Visit Number 3    Date for Recertification  03/19/24    Authorization Type Medicare & [employer]    Progress Note Due on Visit 10    PT Start Time 1102    PT Stop Time 1146    PT Time Calculation (min) 44 min    Activity Tolerance Patient tolerated treatment well;Patient limited by fatigue;Treatment limited secondary to medical complications (Comment)   orthostatic hypotension   Behavior During Therapy WFL for tasks assessed/performed            Past Medical History:  Diagnosis Date   Bipolar 1 disorder (HCC)    COPD (chronic obstructive pulmonary disease) (HCC)    Depression    First degree AV block    Hypertension    pt denies but was in medical record   Hypoglycemia, unspecified    Myalgia and myositis, unspecified    Obesity    Pneumonia 1970   Skin cancer (melanoma) (HCC)    Thyroid  disease    Unspecified hypothyroidism    UPJ obstruction, congenital    Past Surgical History:  Procedure Laterality Date   APPENDECTOMY  1939   malignant melanoma removed from right forehead  2008   PERMANENT PACEMAKER INSERTION N/A 06/19/2013   Procedure: PERMANENT PACEMAKER INSERTION;  Surgeon: Danelle LELON Birmingham, MD;  Location: [facility] CATH LAB;  Service: Cardiovascular;  Laterality: N/A;   PPM GENERATOR CHANGEOUT N/A 09/28/2022   Procedure: PPM GENERATOR CHANGEOUT;  Surgeon: Birmingham Danelle LELON, MD;  Location: [facility] INVASIVE CV LAB;  Service: Cardiovascular;  Laterality: N/A;   right big toe  surgery  2009   TRANSURETHRAL RESECTION OF PROSTATE  02/04/2011   Procedure: TRANSURETHRAL RESECTION OF THE PROSTATE (TURP);  Surgeon: Noretta Ferrara, MD;  Location: [facility] ORS;  Service: Urology;  Laterality: N/A;   URETHRA SURGERY  2010   reconstruction   Patient Active Problem List   Diagnosis Date  Noted   Hyperlipidemia 03/15/2022   TIA (transient ischemic attack) 03/14/2022   History of neoplasm 04/12/2021   Melanocytic nevi of trunk 04/12/2021   Other seborrheic keratosis 04/12/2021   Cerebral embolism with cerebral infarction 03/31/2021   Left leg weakness 03/30/2021   Chronic post-traumatic stress disorder 12/05/2017   Bipolar I disorder, current or most recent episode depressed, in partial remission (HCC) 12/05/2017   CHB (complete heart block) (HCC) 01/27/2017   HTN (hypertension) 01/27/2017   Hypothyroidism 10/11/2016   Parkinson disease (HCC) 09/08/2016   Chronic osteoarthritis 03/20/2015   Bipolar 1 disorder (HCC) 03/20/2015   Tremor 03/20/2015   Pacemaker 09/24/2013   SSS (sick sinus syndrome) (HCC) 06/19/2013   First degree AV block 04/09/2013   Allergic rhinitis 03/17/2013   Obstructive sleep apnea 12/18/2006   COPD mixed type (HCC) 12/18/2006   History of malignant melanoma of skin 12/18/2006    PCP: Almarie Birmingham NOVAK, NP   REFERRING PROVIDER: Evonnie Asberry RAMAN, DO   REFERRING DIAG: G20.A1 (ICD-10-CM) - Parkinson's disease without dyskinesia or fluctuating manifestations (HCC)   THERAPY DIAG:  Parkinson's disease without dyskinesia or fluctuating manifestations (HCC)  Other abnormalities of gait and mobility  Unsteadiness on feet  Abnormal posture  Muscle weakness (generalized)  RATIONALE FOR EVALUATION AND TREATMENT: Rehabilitation  ONSET DATE: Initial PD diagnosis >15 yrs ago  NEXT MD VISIT:  03/12/2024    SUBJECTIVE:                                                                                                                                                                                                         SUBJECTIVE STATEMENT: Pt reports feeling lightheaded upon rising from the chair in the waiting room today and reports he is not wearing his support stockings this morning.   EVAL: Victor Osborne reports he gets fatigued quite easily.   Fatigue and weakness make ADLs more difficult and takes more concentration.  Getting up (sit to stand) takes more effort and time to complete.  He denies any falls.  He uses his rollator mostly in his apartment but goes out with just a single hiking pole (lost the 2nd one).  Walking takes a lot of energy.  Does not feel as strong as he was.  PAIN: Are you having pain? No  PERTINENT HISTORY:  PPM 2 sick sinus syndrome/SA node dysfunction & complete heart block, orthostatic hypotension, COPD, OSA, HTN, DM2, Parkinson's Disease, CVA 03/2021, hypothyroidism, PTSD, bipolar I disorder, peripheral neuropathy   PRECAUTIONS: Fall and ICD/Pacemaker  RED FLAGS: None  WEIGHT BEARING RESTRICTIONS: No  FALLS:  Has patient fallen in last 6 months? 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   OBJECTIVE: (objective measures completed at initial evaluation unless otherwise dated)  DIAGNOSTIC FINDINGS:  12/04/23 - CT HEAD WITHOUT CONTRAST CLINICAL HISTORY: generalized weakness   FINDINGS:   BRAIN AND VENTRICLES: No acute hemorrhage. No evidence of acute infarct. No hydrocephalus. No extra-axial collection. No mass effect or midline shift. Mild age related volume loss. Minimal chronic microvascular ischemic changes.   ORBITS: Postsurgical changes of the globes.   SINUSES: Mucosal thickening in the ethmoid sinuses.   SOFT TISSUES AND SKULL: No acute soft tissue abnormality. No skull fracture.   IMPRESSION: 1. No acute intracranial abnormality.  COGNITION: Overall  cognitive status: Within functional limits for tasks assessed, but he feels like his memory is sliding some.   SENSATION: WFL Except neuropathy in his feet - most notable at night   COORDINATION: Gross motor [employer]  B UE/LE   POSTURE:  rounded shoulders, forward head, and flexed trunk   UPPER EXTREMITY MMT:  MMT Right eval Left eval  Shoulder flexion    Shoulder extension    Shoulder abduction    Shoulder adduction    Shoulder extension    Shoulder internal rotation    Shoulder external rotation    Middle trapezius    Lower trapezius    Elbow flexion    Elbow extension    Wrist flexion    Wrist extension    Wrist ulnar deviation    Wrist radial deviation    Wrist pronation 4+ 5  Wrist supination 4+ 5  Grip strength 43.33# 56#   (Blank rows = not tested)   MUSCLE LENGTH: Hamstrings: mod tight B ITB: mild/mod tight B Piriformis: mod tight B Hip flexors: severe tight B Quads: mod tight B Heelcord: mod tight L, mild tight R   LOWER EXTREMITY ROM:    Limited related to muscle tightness as above - greatest limitation in B hip extension  LOWER EXTREMITY MMT:    MMT Right eval Left eval  Hip flexion 4+ 4+  Hip extension 4- ^ 4- ^  Hip abduction 4- 4-  Hip adduction 4 4  Hip internal rotation 4+ 4+  Hip external rotation 4 4  Knee flexion 5 5  Knee extension 5 5  Ankle dorsiflexion 4+ 4+  Ankle plantarflexion    Ankle inversion    Ankle eversion    (Blank rows = not tested)  BED MOBILITY:  Sit to supine Modified independence Supine to sit Modified independence Rolling to Right Modified independence Rolling to Left Modified independence  TRANSFERS: Assistive device utilized: Single hiking pole and None  Sit to stand: Modified independence Stand to sit: Modified independence Chair to chair: Modified independence Floor: NT  GAIT: Distance walked: Clinic distances Assistive device utilized: Single hiking pole on R Level of assistance: Modified  independence Gait pattern: step through pattern, decreased arm swing- Right, decreased arm swing- Left, and trunk flexed Comments: Patient reports he has been using his 4WW/rollator mostly in his apartment and single hiking pole or one of his canes when he goes out.  STAIRS:  Level of Assistance:   Stair Negotiation Technique:    Number of Stairs:    Height of Stairs:   Comments:   FUNCTIONAL TESTS:  5 times sit to stand: 23.06 sec w/o UE assist Timed up and go (TUG): 12.63 sec with single hiking pole W/o AD: 13.10 sec (Normal), 12.16 sec (Manual), 16.97 sec (Cognitive - counting back wards by 2's) 10 meter walk test: 10.38 sec w/o AD; 10.79 sec with single hiking pole Gait speed = 3.16 ft/sec w/o AD; 3.04 ft/sec with single hiking pole 01/31/24: Berg = 44/56, 37-45 significant (>80%) fall risk FGA = 15/30, < 19 = high risk fall   12/26/23 - PD Screen: 5 time sit to stand test: Only able to complete 3 reps w/o UE assist (21.00 sec); 14.75 sec with single UE assist on R Baseline as of D/C from last PT episode: 14.22 sec TUG - Timed Up and Go test: 12.78 sec with single hiking pole W/o AD: 13.75 sec (Normal), 12.06 sec (Manual), 12.32 sec(Cognitive - counting back wards by 1's) Baseline as of D/C from last PT episode: 10.62 sec (Normal), 11.21 sec (Manual), 13.25 sec (Cognitive - unable to complete cognitive task w/o multiple errors)  10 meter walk test: 9.19 sec w/o AD, 9.75 sec with single hiking pole  Baseline as of D/C from last PT episode: 8.50 sec w/o AD, 8.16 sec with hiking pole  Gait speed: 3.54 ft/sec w/o AD, 3.36 ft/sec with hiking pole  Baseline as of D/C from last PT episode: 3.86 ft/sec w/o AD, 4.02 ft/sec with hiking pole    06/29/23 - DC from last PT episode: 5xSTS = 14.22 sec TUG): 10.62 sec (Normal), 11.21 sec (Manual), 13.25 sec (Cognitive - unable to complete cognitive task w/o multiple errors) = 8.50 sec w/o AD, 8.16 sec with hiking pole Gait speed = 3.86  ft/sec w/o AD, 4.02 ft/sec with hiking pole Berg = 51/56; 46-51 indicates moderate risk for falls (>50%)  FGA = 24/30; 19-24 indicates medium risk fall   PATIENT SURVEYS:  ABC scale: The Activities-Specific Balance Confidence (ABC) Scale 0% 10 20 30  40 50 60 70 80 90 100% No confidence<->completely confident  How confident are you that you will not lose your balance or become unsteady when you . . .   Date tested 01/23/2024   Walk around the house 70%  2. Walk up or down stairs 70%  3. Bend over and pick up a slipper from in front of a closet floor 70%  4. Reach for a small can off a shelf at eye level 70%  5. Stand on tip toes and reach for something above your head 50%  6. Stand on a chair and reach for something 20%  7. Sweep the floor 70%  8. Walk outside the house to a car parked in the driveway 70%  9. Get into or out of a car 70%  10. Walk across a parking lot to the mall 70%  11. Walk up or down a ramp 60%  12. Walk in a crowded mall where people rapidly walk past you 70%  13. Are bumped into by people as you walk through the mall 60%  14. Step onto or off of an escalator while you are holding onto the railing 70%  15. Step onto or off an escalator while holding onto parcels such that you cannot hold onto the railing 60%  16. Walk outside on icy sidewalks 60%  Total: #/16  63.1 %     06/29/23 - DC from last PT episode: 1260 / 1600 = 78.8 %    TODAY'S TREATMENT:    02/02/2024  THERAPEUTIC ACTIVITIES:  Orthostatic BP assessment:  Time BP HR Symptoms  Lying down  NT    Sitting 1-2 min 118/68 61 SpO2 = 98%, asymptomatic  Standing 1-2 min 100/56  lightheaded    VS after 8 oz of water  and LE exercises: Orthostatic BP assessment:  Time BP HR Symptoms  Sitting 1-2 min 118/64  asymptomatic  Standing 1-2 min 110/60  asymptomatic    THERAPEUTIC EXERCISE: To improve strength, endurance, ROM, and flexibility.  Demonstration, verbal and tactile cues throughout for technique.   Seated heel-toe raises x 20 Seated alt LAQ x 10 Seated march x 10 NuStep - L4 x 6' Lumbar extension in standing at wall leaning on forearms 8 x 10 Standing hip flexor stretch with UE support on wall 2 x 30 B  NEUROMUSCULAR RE-EDUCATION: To improve strength, coordination, kinesthesia, posture, amplitude of movement, speed of movement to reduce bradykinesia, and reduce rigidity. Seated PWR! Moves: Up x 10 Rock x 10 Twist x 10 Step x 5   01/31/2024  PHYSICAL PERFORMANCE TEST or MEASUREMENT:  Berg Balance Test   Sit to Stand Able to stand without using hands and  stabilize independently    Standing Unsupported Able to stand safely 2 minutes    Sitting with Back Unsupported but Feet Supported on Floor or Stool Able to sit safely and securely 2 minutes    Stand to Sit Sits safely with minimal use of hands    Transfers Able to transfer safely, minor use of hands    Standing Unsupported with Eyes Closed Able to stand 10 seconds with supervision    Standing Unsupported with Feet Together Able to place feet together independently and stand for 1 minute with supervision    From Standing, Reach Forward with Outstretched Arm Can reach forward >12 cm safely (5)    From Standing Position, Pick up Object from Floor Able to pick up shoe safely and easily    From Standing Position, Turn to Look Behind Over each Shoulder Turn sideways only but maintains balance    Turn 360 Degrees Able to turn 360 degrees safely but slowly    Standing Unsupported, Alternately Place Feet on Step/Stool Able to stand independently and safely and complete 8 steps in 20 seconds    Standing Unsupported, One Foot in Front Able to take small step independently and hold 30 seconds    Standing on One Leg Tries to lift leg/unable to hold 3 seconds but remains standing independently    Total Score 44    Berg comment: 37-45 significant (>80%) fall risk     Functional Gait  Assessment   Gait Level Surface Walks 20 ft in less  than 7 sec but greater than 5.5 sec, uses assistive device, slower speed, mild gait deviations, or deviates 6-10 in outside of the 12 in walkway width.    Change in Gait Speed Able to change speed, demonstrates mild gait deviations, deviates 6-10 in outside of the 12 in walkway width, or no gait deviations, unable to achieve a major change in velocity, or uses a change in velocity, or uses an assistive device.    Gait with Horizontal Head Turns Performs head turns with moderate changes in gait velocity, slows down, deviates 10-15 in outside 12 in walkway width but recovers, can continue to walk.    Gait with Vertical Head Turns Performs task with slight change in gait velocity (eg, minor disruption to smooth gait path), deviates 6 - 10 in outside 12 in walkway width or uses assistive device    Gait and Pivot Turn Pivot turns safely in greater than 3 sec and stops with no loss of balance, or pivot turns safely within 3 sec and stops with mild imbalance, requires small steps to catch balance.    Step Over Obstacle Is able to step over one shoe box (4.5 in total height) without changing gait speed. No evidence of imbalance.    Gait with Narrow Base of Support Ambulates less than 4 steps heel to toe or cannot perform without assistance.    Gait with Eyes Closed Walks 20 ft, slow speed, abnormal gait pattern, evidence for imbalance, deviates 10-15 in outside 12 in walkway width. Requires more than 9 sec to ambulate 20 ft.    Ambulating Backwards Walks 20 ft, slow speed, abnormal gait pattern, evidence for imbalance, deviates 10-15 in outside 12 in walkway width.    Steps Alternating feet, must use rail.    Total Score 15    FGA comment: < 19 = high risk fall       THERAPEUTIC EXERCISE: To improve ROM and flexibility.  Demonstration, verbal and tactile cues throughout for  technique.  Mod thomas hip flexor stretch with strap x 30 Hooklying HS stretch with strap x 30 Hooklying figure-4 piriformis stretch  with light overpressure x 30 Hooklying KTOS piriformis stretch x 30 Standing lumbar extension at wall leaning on forearms 2 x 30   01/23/2024 - Eval SELF CARE:  Reviewed eval findings and role of PT in addressing identified deficits as well as instruction in initial HEP (see below).    PATIENT EDUCATION:  Education details: HEP update - standing hip flexor stretch and PWR! Moves - seated  Person educated: Patient Education method: Explanation, Demonstration, Verbal cues, Tactile cues, and Handouts Education comprehension: verbalized understanding, returned demonstration, verbal cues required, tactile cues required, and needs further education  HOME EXERCISE PROGRAM: Access Code: [email or web address] URL: [email or web address] Date: 02/02/2024 Prepared by: Elijah Hidden  Exercises - Hip Flexor Stretch with Strap on Table  - 2 x daily - 7 x weekly - 3 reps - 30 sec hold - Supine Hamstring Stretch with Strap  - 2 x daily - 7 x weekly - 3 reps - 30 sec hold - Supine Piriformis Stretch with Foot on Ground (Mirrored)  - 2 x daily - 7 x weekly - 3 reps - 30 sec hold - Supine Figure 4 Piriformis Stretch  - 2 x daily - 7 x weekly - 3 reps - 30 sec hold - Standing Lumbar Extension at Wall - Forearms  - 2 x daily - 7 x weekly - 3 reps - 30 sec hold - Standing Hip Flexor Stretch  - 2 x daily - 7 x weekly - 3 reps - 30 sec hold  PWR! Moves: Seated   ASSESSMENT:  CLINICAL IMPRESSION: Victor Osborne reports lightheaded feeling upon rising from waiting room chair and states he is not wearing his compression/support stockings today.  VS assessed with orthostatic BP dropped observed from sitting to standing.  Patient provided with 8 oz water  bottle and guided in seated LE exercises to promote better circulatory return with repeat VS assessment revealing less of of a BP drop.  Proceeded with NuStep for more formal warm-up as well as promotion of reciprocal movement patterns.  Reviewed postural  stretching with lumbar extension at wall and added standing hip flexor stretch to further promote upright posture.  Session concluded with initiation of seated PWR! Moves to facilitate posture, weight shift, trunk rotation and transitional movement limited by Parkinson's disease.  Victor Osborne will benefit from continued skilled PT to address ongoing flexibility, strength, coordination and balance deficits to improve mobility and activity tolerance with decreased risk for falls.   EVAL: Victor Osborne is a 89 y.o. male who was referred to physical therapy for evaluation and treatment for Parkinson's disease. Patient first diagnosed with Parkinson's >15 years ago.  Most recent PT episode for Parkinson's disease was 04/06/2023 - 06/29/2023.  Since his last PT episode, he reports increased weakness and fatigue impacting ADLs and ambulation tolerance.  Patient presents with physical impairments of decreased timing and coordination of gait, impaired ambulation, impaired standing balance, abnormal posture, bradykinesia with transfers, impaired activity tolerance, LE weakness, postural instability and decreased safety awareness impacting safe and independent functional mobility.  Examination revealed patient is at risk for falls and functional decline as evidenced by the following objective test measures: 5xSTS of 23.06 sec (>15 sec indicates increased risk for falls and decreased BLE power), Gait speed 3.16 ft/sec w/o AD and 3.04 ft/sec with single hiking pole (3.94 ft/sec is needed for safe community ambulation when crossing streets),  TUG of 13.10 sec (>13.5 sec indicates increased risk for falls), TUG Manual of 12.16 sec (difference between TUG manual and TUG >4.5 seconds indicates increased fall risk), and TUG cognitive of 16.97 sec (>/= 15 seconds indicates high risk for falls and community dwelling older adults).  TUG scores of >10% difference indicate difficulty with dual tasking.  Further testing of balance  with Berg and FGA to be completed next visit.  ABC scale score of 63.1% indicates a moderate level of physical functioning, with scores of <69% indicating risk for recurrent falls in PD.  Victor Osborne will benefit from skilled PT to address above deficits to improve mobility and activity tolerance to help reach the maximal level of functional independence with mobility and gait with reduced risk for falls.  Patient demonstrates understanding of this POC and is in agreement with this plan.   OBJECTIVE IMPAIRMENTS: Abnormal gait, decreased activity tolerance, decreased balance, decreased coordination, decreased endurance, decreased knowledge of use of DME, decreased mobility, difficulty walking, decreased ROM, decreased strength, decreased safety awareness, impaired perceived functional ability, improper body mechanics, and postural dysfunction.   ACTIVITY LIMITATIONS: carrying, lifting, standing, squatting, stairs, transfers, bed mobility, reach over head, and locomotion level  PARTICIPATION LIMITATIONS: meal prep, cleaning, laundry, driving, shopping, and community activity  PERSONAL FACTORS: Age, Past/current experiences, Time since onset of injury/illness/exacerbation, and 3+ comorbidities: PPM 2 sick sinus syndrome/SA node dysfunction & complete heart block, orthostatic hypotension, COPD, OSA, HTN, DM2, Parkinson's Disease, CVA 03/2021, hypothyroidism, PTSD, bipolar I disorder, peripheral neuropathy  are also affecting patient's functional outcome.   REHAB POTENTIAL: Good  CLINICAL DECISION MAKING: Evolving/moderate complexity  EVALUATION COMPLEXITY: Moderate   GOALS: Goals reviewed with patient? Yes  SHORT TERM GOALS: Target date: 02/20/2024  Complete remaining standardized balance assessment and update LTG's accordingly. Baseline: Berg and FGA TBA  Goal status: MET - 01/31/24  2.  Patient will be independent with initial HEP. Baseline: HEP initiated on eval Goal status: IN PROGRESS -  01/31/24 - added hip flexor stretch and seated PWR! Moves  3.  Patient will demonstrate improved B proximal LE strength to >/= 4+/5 for improved stability and ease of mobility. Baseline: Refer to above LE MMT table Goal status: INITIAL  4.  Patient will improve 5x STS time to </= 18 seconds to demonstrate improved functional strength and transfer efficiency. Baseline: 23.06 sec Goal status: INITIAL  LONG TERM GOALS: Target date: 03/19/2024   Patient will be independent with ongoing/advanced HEP for self-management at home incorporating PWR! Moves as indicated.  Baseline:  Goal status: INITIAL  2.  Patient will be able to ambulate at least 800 ft with LRAD including outdoor and unlevel surfaces, independently with no LOB, for improved community gait. .  Baseline:  Goal status: INITIAL  3.  Patient will be able to step up/down curb safely with LRAD for safety with community ambulation.  Baseline:  Goal status: INITIAL   4.  Patient will demonstrate at least 4 point improvement on FGA or score of >/= 24/30 (baseline as of DC from last PT episode) to improve gait stability and reduce risk for falls.  Baseline: 01/31/24 - 15/30 Goal status: INITIAL  5.  Patient will improve Berg score by at least 8 points or score >/= 51/56 (baseline as of DC from last PT episode) to improve safety and stability with ADLs in standing and reduce risk for falls.   Baseline: 01/31/24 - 44/56 Goal status: INITIAL  6.  Patient will report >/= 70% on  ABC scale to demonstrate improved balance confidence and decreased risk for falls. Baseline: 1010 / 1600 = 63.1 % Goal status: INITIAL  7. Patient will verbalize understanding of local Parkinson's disease community resources, including community fitness post d/c. Baseline:  Goal status: INITIAL   PLAN:  PT FREQUENCY: 2x/week  PT DURATION: 8 weeks  PLANNED INTERVENTIONS: [id number]- PT Re-evaluation, 97750- Physical Performance Testing, 97110-Therapeutic  exercises, 97530- Therapeutic activity, [id number]- Neuromuscular re-education, 97535- Self Care, [id number]- Manual therapy, [phone]- Gait training, Patient/Family education, Balance training, Stair training, DME instructions, Cryotherapy, and Moist heat  PLAN FOR NEXT SESSION: Progress proximal LE flexibility and initiate core/postural and proximal LE strengthening, update/modify HEP as indicated; progress PWR! Moves as tolerated   Elijah CHRISTELLA Hidden, PT 02/02/2024, 12:54 PM  "

## 2024-02-06 ENCOUNTER — Encounter: Payer: Self-pay | Admitting: Physical Therapy

## 2024-02-06 ENCOUNTER — Ambulatory Visit: Admitting: Physical Therapy

## 2024-02-06 ENCOUNTER — Encounter: Payer: Self-pay | Admitting: Physician Assistant

## 2024-02-06 ENCOUNTER — Ambulatory Visit (INDEPENDENT_AMBULATORY_CARE_PROVIDER_SITE_OTHER): Admitting: Physician Assistant

## 2024-02-06 DIAGNOSIS — G20A1 Parkinson's disease without dyskinesia, without mention of fluctuations: Secondary | ICD-10-CM

## 2024-02-06 DIAGNOSIS — R2689 Other abnormalities of gait and mobility: Secondary | ICD-10-CM

## 2024-02-06 DIAGNOSIS — F3175 Bipolar disorder, in partial remission, most recent episode depressed: Secondary | ICD-10-CM | POA: Diagnosis not present

## 2024-02-06 DIAGNOSIS — R293 Abnormal posture: Secondary | ICD-10-CM

## 2024-02-06 DIAGNOSIS — R2681 Unsteadiness on feet: Secondary | ICD-10-CM

## 2024-02-06 DIAGNOSIS — F22 Delusional disorders: Secondary | ICD-10-CM

## 2024-02-06 DIAGNOSIS — M6281 Muscle weakness (generalized): Secondary | ICD-10-CM

## 2024-02-06 MED ORDER — QUETIAPINE FUMARATE ER 50 MG PO TB24: 50.0000 mg | ORAL_TABLET | Freq: Every day | ORAL | 1 refills | Status: AC

## 2024-02-06 MED ORDER — CARBAMAZEPINE 200 MG PO TABS: 200.0000 mg | ORAL_TABLET | Freq: Three times a day (TID) | ORAL | 1 refills | Status: AC

## 2024-02-06 MED ORDER — QUETIAPINE FUMARATE 25 MG PO TABS: 25.0000 mg | ORAL_TABLET | Freq: Every day | ORAL | 1 refills | Status: AC

## 2024-02-06 NOTE — Progress Notes (Signed)
 "     [facility]  Patient ID: Victor Osborne,  MRN: [id number]  PCP: Almarie Waddell NOVAK, NP  Date of Evaluation: 02/06/2024 Time spent:30 minutes  Chief Complaint:  Chief Complaint   Follow-up    HISTORY/CURRENT STATUS: HPI For 6 month med check  Paranoia occurs but he knows when things are real and when not. Meds are working.  No manic sx.  Energy and motivation are good 'for being nearly 89 years old.'  Has been more sad the past few months.  Several friends died.  And it was the holidays.  Feels better, at least a little bit, right now. Sleeps ok.  Seroquel  helps. ADLs and personal hygiene are normal.  No changes in memory.  Appetite has not changed.  No c/o anxiety.  No SI/HI.  Individual Medical History/ Review of Systems: Changes? :No   Past Psychiatric Medication Trials: Carbamazepine  Lithium Prozac  Remeron Seroquel  XR Ambien   Allergies: Patient has no known allergies.  Current Medications:  Current Outpatient Medications:    acetaminophen  (TYLENOL ) 325 MG tablet, Take 650 mg by mouth every 6 (six) hours as needed for mild pain or headache., Disp: , Rfl:    alclomethasone (ACLOVATE) 0.05 % ointment, Apply 1 application topically 2 (two) times daily as needed (for rash)., Disp: , Rfl:    carbidopa -levodopa  (SINEMET  IR) 25-100 MG tablet, Take 2 tablets by mouth 3 (three) times daily., Disp: 540 tablet, Rfl: 1   cholecalciferol  (VITAMIN D) 1000 UNITS tablet, Take 2,000 Units by mouth daily., Disp: , Rfl:    clopidogrel  (PLAVIX ) 75 MG tablet, Take 1 tablet (75 mg total) by mouth daily., Disp: 90 tablet, Rfl: 3   Coenzyme Q10 (CO Q 10 PO), Take 1 capsule by mouth daily., Disp: , Rfl:    ferrous sulfate  324 (65 Fe) MG TBEC, Take 324 mg by mouth daily., Disp: , Rfl:    Flaxseed, Linseed, (FLAX SEED OIL) 1300 MG CAPS, Take 1 capsule by mouth daily., Disp: , Rfl:    FLUoxetine  (PROZAC ) 10 MG capsule, Take 1 capsule (10 mg total) by mouth daily., Disp: 90 capsule, Rfl:  1   ketoconazole (NIZORAL) 2 % cream, Apply 1 Application topically daily., Disp: , Rfl:    levothyroxine  (SYNTHROID ) 125 MCG tablet, TAKE 1 TABLET BY MOUTH DAILY BEFORE BREAKFAST., Disp: 90 tablet, Rfl: 1   MAGNESIUM PO, Take 1 capsule by mouth daily., Disp: , Rfl:    Multiple Vitamin (MULITIVITAMIN WITH MINERALS) TABS, Take 1 tablet by mouth daily., Disp: , Rfl:    Omega-3 Fatty Acids (OMEGA-3 CF PO), Take 1,200 mg by mouth daily., Disp: , Rfl:    POTASSIUM GLUCONATE PO, Take 1 capsule by mouth daily., Disp: , Rfl:    pravastatin  (PRAVACHOL ) 80 MG tablet, Take 1 tablet (80 mg total) by mouth daily., Disp: 90 tablet, Rfl: 0   tamsulosin  (FLOMAX ) 0.4 MG CAPS capsule, Take 0.4 mg by mouth at bedtime., Disp: , Rfl:    triamcinolone ointment (KENALOG) 0.1 %, Apply 1 Application topically 2 (two) times daily., Disp: , Rfl:    carbamazepine  (TEGRETOL ) 200 MG tablet, Take 1 tablet (200 mg total) by mouth 3 (three) times daily., Disp: 270 tablet, Rfl: 1   Fluticasone -Umeclidin-Vilant (TRELEGY ELLIPTA ) 100-62.5-25 MCG/ACT AEPB, Inhale 1 puff into the lungs daily., Disp: 1 each, Rfl: 6   QUEtiapine  (SEROQUEL  XR) 50 MG TB24 24 hr tablet, Take 1 tablet (50 mg total) by mouth at bedtime., Disp: 90 tablet, Rfl: 1   QUEtiapine  (  SEROQUEL ) 25 MG tablet, Take 1 tablet (25 mg total) by mouth at bedtime. Take with Seroquel  XR 50 mg., Disp: 90 tablet, Rfl: 1 Medication Side Effects: none  Family Medical/ Social History: Changes? No  MENTAL HEALTH EXAM:  There were no vitals taken for this visit.There is no height or weight on file to calculate BMI.  General Appearance: Casual and Well Groomed  Eye Contact:  Good  Speech:  Clear and Coherent and Normal Rate  Volume:  Normal  Mood:  Euthymic  Affect:  Congruent  Thought Process:  Goal Directed and Descriptions of Associations: Circumstantial  Orientation:  Full (Time, Place, and Person)  Thought Content: Logical   Suicidal Thoughts:  No  Homicidal Thoughts:   No  Memory:  WNL  Judgement:  Good  Insight:  Good  Psychomotor Activity:  walks with cane. No tremor. Has mild abnl lower lip movement to the right. No other abnl movements   Concentration:  Concentration: Good and Attention Span: Good  Recall:  Good  Fund of Knowledge: Good  Language: Good  Assets:  Communication Skills Desire for Improvement Financial Resources/Insurance Resilience Social Support Transportation  ADL's:  Intact  Cognition: WNL  Prognosis:  Good   DIAGNOSES:    ICD-10-CM   1. Bipolar I disorder, current or most recent episode depressed, in partial remission (HCC)  F31.75 carbamazepine  (TEGRETOL ) 200 MG tablet    QUEtiapine  (SEROQUEL  XR) 50 MG TB24 24 hr tablet    2. Paranoia (HCC)  F22     3. Parkinson's disease, unspecified whether dyskinesia present, unspecified whether manifestations fluctuate (HCC)  G20.A1       Receiving Psychotherapy: No   RECOMMENDATIONS:   PDMP reviewed.  No controlled substances. I provided approximately 20  minutes of face to face time during this encounter, including time spent before and after the visit in records review, medical decision making, counseling pertinent to today's visit, and charting.   We discussed increasing the Prozac  but he prefers not to make changes at this time. If needed, he'll call us  between visits and we can increase over the phone.   Continue Tegretol  200 mg, 1 p.o. 3 times daily. Continue Prozac  10 mg, 1 p.o. daily. Continue Seroquel  XR 50 mg, 1 p.o. nightly. Cont Seroquel  IR 25 mg, 1 p.o. nightly. Return in 2 months.  Verneita Cooks, PA-C  "

## 2024-02-06 NOTE — Therapy (Signed)
 " OUTPATIENT PHYSICAL THERAPY TREATMENT   Patient Name: Stein Windhorst MRN: [id number] DOB:07-Jan-1933, 89 y.o., male Today's Date: 02/06/2024   END OF SESSION:  PT End of Session - 02/06/24 1100     Visit Number 4    Date for Recertification  03/19/24    Authorization Type Medicare & [employer]    Progress Note Due on Visit 10    PT Start Time 1100    PT Stop Time 1146    PT Time Calculation (min) 46 min    Activity Tolerance Patient tolerated treatment well;Patient limited by fatigue    Behavior During Therapy WFL for tasks assessed/performed             Past Medical History:  Diagnosis Date   Bipolar 1 disorder (HCC)    COPD (chronic obstructive pulmonary disease) (HCC)    Depression    First degree AV block    Hypertension    pt denies but was in medical record   Hypoglycemia, unspecified    Myalgia and myositis, unspecified    Obesity    Pneumonia 1970   Skin cancer (melanoma) (HCC)    Thyroid  disease    Unspecified hypothyroidism    UPJ obstruction, congenital    Past Surgical History:  Procedure Laterality Date   APPENDECTOMY  1939   malignant melanoma removed from right forehead  2008   PERMANENT PACEMAKER INSERTION N/A 06/19/2013   Procedure: PERMANENT PACEMAKER INSERTION;  Surgeon: Danelle LELON Birmingham, MD;  Location: [facility] CATH LAB;  Service: Cardiovascular;  Laterality: N/A;   PPM GENERATOR CHANGEOUT N/A 09/28/2022   Procedure: PPM GENERATOR CHANGEOUT;  Surgeon: Birmingham Danelle LELON, MD;  Location: [facility] INVASIVE CV LAB;  Service: Cardiovascular;  Laterality: N/A;   right big toe  surgery  2009   TRANSURETHRAL RESECTION OF PROSTATE  02/04/2011   Procedure: TRANSURETHRAL RESECTION OF THE PROSTATE (TURP);  Surgeon: Noretta Ferrara, MD;  Location: [facility] ORS;  Service: Urology;  Laterality: N/A;   URETHRA SURGERY  2010   reconstruction   Patient Active Problem List   Diagnosis Date Noted   Hyperlipidemia 03/15/2022   TIA (transient ischemic attack) 03/14/2022    History of neoplasm 04/12/2021   Melanocytic nevi of trunk 04/12/2021   Other seborrheic keratosis 04/12/2021   Cerebral embolism with cerebral infarction 03/31/2021   Left leg weakness 03/30/2021   Chronic post-traumatic stress disorder 12/05/2017   Bipolar I disorder, current or most recent episode depressed, in partial remission (HCC) 12/05/2017   CHB (complete heart block) (HCC) 01/27/2017   HTN (hypertension) 01/27/2017   Hypothyroidism 10/11/2016   Parkinson disease (HCC) 09/08/2016   Chronic osteoarthritis 03/20/2015   Bipolar 1 disorder (HCC) 03/20/2015   Tremor 03/20/2015   Pacemaker 09/24/2013   SSS (sick sinus syndrome) (HCC) 06/19/2013   First degree AV block 04/09/2013   Allergic rhinitis 03/17/2013   Obstructive sleep apnea 12/18/2006   COPD mixed type (HCC) 12/18/2006   History of malignant melanoma of skin 12/18/2006    PCP: Almarie Birmingham NOVAK, NP   REFERRING PROVIDER: Evonnie Asberry RAMAN, DO   REFERRING DIAG: G20.A1 (ICD-10-CM) - Parkinson's disease without dyskinesia or fluctuating manifestations (HCC)   THERAPY DIAG:  Parkinson's disease without dyskinesia or fluctuating manifestations (HCC)  Other abnormalities of gait and mobility  Unsteadiness on feet  Abnormal posture  Muscle weakness (generalized)  RATIONALE FOR EVALUATION AND TREATMENT: Rehabilitation  ONSET DATE: Initial PD diagnosis >15 yrs ago   NEXT MD VISIT:  03/12/2024  SUBJECTIVE:                                                                                                                                                                                                         SUBJECTIVE STATEMENT: Pt reports he has increased his water  intake and is not having as much issues with the lightheadedness.   EVAL: Kari Kerth reports he gets fatigued quite easily.  Fatigue and weakness make ADLs more difficult and takes more concentration.  Getting up (sit to stand) takes more effort and time  to complete.  He denies any falls.  He uses his rollator mostly in his apartment but goes out with just a single hiking pole (lost the 2nd one).  Walking takes a lot of energy.  Does not feel as strong as he was.  PAIN: Are you having pain? No  PERTINENT HISTORY:  PPM 2 sick sinus syndrome/SA node dysfunction & complete heart block, orthostatic hypotension, COPD, OSA, HTN, DM2, Parkinson's Disease, CVA 03/2021, hypothyroidism, PTSD, bipolar I disorder, peripheral neuropathy   PRECAUTIONS: Fall and ICD/Pacemaker  RED FLAGS: None  WEIGHT BEARING RESTRICTIONS: No  FALLS:  Has patient fallen in last 6 months? 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   OBJECTIVE: (objective measures completed at initial evaluation unless otherwise dated)  DIAGNOSTIC FINDINGS:  12/04/23 - CT HEAD WITHOUT CONTRAST CLINICAL HISTORY: generalized weakness   FINDINGS:   BRAIN AND VENTRICLES: No acute hemorrhage. No evidence of acute infarct. No hydrocephalus. No extra-axial collection. No mass effect or midline shift. Mild age related volume loss. Minimal chronic microvascular ischemic changes.   ORBITS: Postsurgical changes of the globes.   SINUSES: Mucosal thickening in the ethmoid sinuses.   SOFT TISSUES AND SKULL: No acute soft tissue abnormality. No skull fracture.   IMPRESSION: 1. No acute intracranial abnormality.  COGNITION: Overall cognitive status: Within functional limits for tasks assessed, but he feels like his memory is sliding some.   SENSATION: WFL Except  neuropathy in his feet - most notable at night   COORDINATION: Gross motor WFL B UE/LE   POSTURE:  rounded shoulders, forward head, and flexed trunk  UPPER EXTREMITY MMT:  MMT Right eval Left eval  Shoulder flexion    Shoulder extension    Shoulder abduction    Shoulder adduction    Shoulder extension    Shoulder internal rotation    Shoulder external rotation    Middle trapezius    Lower trapezius    Elbow flexion    Elbow extension    Wrist flexion    Wrist extension    Wrist ulnar deviation    Wrist radial deviation    Wrist pronation 4+ 5  Wrist supination 4+ 5  Grip strength 43.33# 56#   (Blank rows = not tested)   MUSCLE LENGTH: Hamstrings: mod tight B ITB: mild/mod tight B Piriformis: mod tight B Hip flexors: severe tight B Quads: mod tight B Heelcord: mod tight L, mild tight R   LOWER EXTREMITY ROM:    Limited related to muscle tightness as above - greatest limitation in B hip extension  LOWER EXTREMITY MMT:    MMT Right eval Left eval  Hip flexion 4+ 4+  Hip extension 4- ^ 4- ^  Hip abduction 4- 4-  Hip adduction 4 4  Hip internal rotation 4+ 4+  Hip external rotation 4 4  Knee flexion 5 5  Knee extension 5 5  Ankle dorsiflexion 4+ 4+  Ankle plantarflexion    Ankle inversion    Ankle eversion    (Blank rows = not tested)  BED MOBILITY:  Sit to supine Modified independence Supine to sit Modified independence Rolling to Right Modified independence Rolling to Left Modified independence  TRANSFERS: Assistive device utilized: Single hiking pole and None  Sit to stand: Modified independence Stand to sit: Modified independence Chair to chair: Modified independence Floor: NT  GAIT: Distance walked: Clinic distances Assistive device utilized: Single hiking pole on R Level of assistance: Modified independence Gait pattern: step through pattern, decreased arm swing- Right, decreased arm swing- Left, and trunk flexed Comments: Patient  reports he has been using his 4WW/rollator mostly in his apartment and single hiking pole or one of his canes when he goes out.  STAIRS:  Level of Assistance:   Stair Negotiation Technique:    Number of Stairs:    Height of Stairs:   Comments:   FUNCTIONAL TESTS:  5 times sit to stand: 23.06 sec w/o UE assist Timed up and go (TUG): 12.63 sec with single hiking pole W/o AD: 13.10 sec (Normal), 12.16 sec (Manual), 16.97 sec (Cognitive - counting back wards by 2's) 10 meter walk test: 10.38 sec w/o AD; 10.79 sec with single hiking pole Gait speed = 3.16 ft/sec w/o AD; 3.04 ft/sec with single hiking pole 01/31/24: Berg = 44/56, 37-45 significant (>80%) fall risk FGA = 15/30, < 19 = high risk fall   12/26/23 - PD Screen: 5 time sit to stand test: Only able to complete 3 reps w/o UE assist (21.00 sec); 14.75 sec with single UE assist on R Baseline as of D/C from last PT episode: 14.22 sec TUG - Timed Up and Go test: 12.78 sec with single hiking pole W/o AD: 13.75 sec (Normal), 12.06 sec (Manual), 12.32 sec(Cognitive - counting back wards by 1's) Baseline as of D/C from last PT episode: 10.62 sec (Normal), 11.21 sec (Manual), 13.25 sec (Cognitive - unable to complete cognitive task w/o multiple errors)  10 meter walk test: 9.19 sec w/o AD, 9.75 sec with single hiking pole  Baseline as of D/C from last PT episode: 8.50 sec w/o AD, 8.16 sec  with hiking pole  Gait speed: 3.54 ft/sec w/o AD, 3.36 ft/sec with hiking pole  Baseline as of D/C from last PT episode: 3.86 ft/sec w/o AD, 4.02 ft/sec with hiking pole    06/29/23 - DC from last PT episode: 5xSTS = 14.22 sec TUG): 10.62 sec (Normal), 11.21 sec (Manual), 13.25 sec (Cognitive - unable to complete cognitive task w/o multiple errors) = 8.50 sec w/o AD, 8.16 sec with hiking pole Gait speed = 3.86 ft/sec w/o AD, 4.02 ft/sec with hiking pole Berg = 51/56; 46-51 indicates moderate risk for falls (>50%)  FGA = 24/30; 19-24 indicates medium  risk fall   PATIENT SURVEYS:  ABC scale: The Activities-Specific Balance Confidence (ABC) Scale 0% 10 20 30  40 50 60 70 80 90 100% No confidence<->completely confident  How confident are you that you will not lose your balance or become unsteady when you . . .  Date tested 01/23/2024   Walk around the house 70%  2. Walk up or down stairs 70%  3. Bend over and pick up a slipper from in front of a closet floor 70%  4. Reach for a small can off a shelf at eye level 70%  5. Stand on tip toes and reach for something above your head 50%  6. Stand on a chair and reach for something 20%  7. Sweep the floor 70%  8. Walk outside the house to a car parked in the driveway 70%  9. Get into or out of a car 70%  10. Walk across a parking lot to the mall 70%  11. Walk up or down a ramp 60%  12. Walk in a crowded mall where people rapidly walk past you 70%  13. Are bumped into by people as you walk through the mall 60%  14. Step onto or off of an escalator while you are holding onto the railing 70%  15. Step onto or off an escalator while holding onto parcels such that you cannot hold onto the railing 60%  16. Walk outside on icy sidewalks 60%  Total: #/16  63.1 %     06/29/23 - DC from last PT episode: 1260 / 1600 = 78.8 %    TODAY'S TREATMENT:   02/06/2024  THERAPEUTIC EXERCISE: To improve strength, endurance, ROM, and flexibility.  Demonstration, verbal and tactile cues throughout for technique.  NuStep - L5 x 5' - UE/LE to promote reciprocal movement pattern Lumbar extension in standing at wall leaning on forearms 2 x 30 Standing hip flexor stretch with UE support on wall 2 x 30 B Seated RTB LAQ 2 x 10 B Seated RTB HS curl 2 x 10 B  NEUROMUSCULAR RE-EDUCATION: To improve strength, coordination, kinesthesia, posture, proprioception, balance, reduce fall risk, amplitude of movement, speed of movement to reduce bradykinesia, and reduce rigidity. SLS + opp RTB 4-way SLR x 10 each direction B -  UE support between wall ladder, hiking pole and/or back of chair Seated PWR! Moves: Up x 10 Rock x 10 Twist x 10 Step x 10 PWR! Sit to stand x 5 from Airex pad in chair    02/02/2024  THERAPEUTIC ACTIVITIES:  Orthostatic BP assessment:  Time BP HR Symptoms  Lying down  NT    Sitting 1-2 min 118/68 61 SpO2 = 98%, asymptomatic  Standing 1-2 min 100/56  lightheaded    VS after 8 oz of water  and LE exercises: Orthostatic BP assessment:  Time BP HR Symptoms  Sitting 1-2 min 118/64  asymptomatic  Standing 1-2 min 110/60  asymptomatic    THERAPEUTIC EXERCISE: To improve strength, endurance, ROM, and flexibility.  Demonstration, verbal and tactile cues throughout for technique.  Seated heel-toe raises x 20 Seated alt LAQ x 10 Seated march x 10 NuStep - L4 x 6' Lumbar extension in standing at wall leaning on forearms 8 x 10 Standing hip flexor stretch with UE support on wall 2 x 30 B  NEUROMUSCULAR RE-EDUCATION: To improve strength, coordination, kinesthesia, posture, amplitude of movement, speed of movement to reduce bradykinesia, and reduce rigidity. Seated PWR! Moves: Up x 10 Rock x 10 Twist x 10 Step x 5   01/31/2024  PHYSICAL PERFORMANCE TEST or MEASUREMENT:  Berg Balance Test   Sit to Stand Able to stand without using hands and stabilize independently    Standing Unsupported Able to stand safely 2 minutes    Sitting with Back Unsupported but Feet Supported on Floor or Stool Able to sit safely and securely 2 minutes    Stand to Sit Sits safely with minimal use of hands    Transfers Able to transfer safely, minor use of hands    Standing Unsupported with Eyes Closed Able to stand 10 seconds with supervision    Standing Unsupported with Feet Together Able to place feet together independently and stand for 1 minute with supervision    From Standing, Reach Forward with Outstretched Arm Can reach forward >12 cm safely (5)    From Standing Position, Pick up Object from Floor  Able to pick up shoe safely and easily    From Standing Position, Turn to Look Behind Over each Shoulder Turn sideways only but maintains balance    Turn 360 Degrees Able to turn 360 degrees safely but slowly    Standing Unsupported, Alternately Place Feet on Step/Stool Able to stand independently and safely and complete 8 steps in 20 seconds    Standing Unsupported, One Foot in Front Able to take small step independently and hold 30 seconds    Standing on One Leg Tries to lift leg/unable to hold 3 seconds but remains standing independently    Total Score 44    Berg comment: 37-45 significant (>80%) fall risk     Functional Gait  Assessment   Gait Level Surface Walks 20 ft in less than 7 sec but greater than 5.5 sec, uses assistive device, slower speed, mild gait deviations, or deviates 6-10 in outside of the 12 in walkway width.    Change in Gait Speed Able to change speed, demonstrates mild gait deviations, deviates 6-10 in outside of the 12 in walkway width, or no gait deviations, unable to achieve a major change in velocity, or uses a change in velocity, or uses an assistive device.    Gait with Horizontal Head Turns Performs head turns with moderate changes in gait velocity, slows down, deviates 10-15 in outside 12 in walkway width but recovers, can continue to walk.    Gait with Vertical Head Turns Performs task with slight change in gait velocity (eg, minor disruption to smooth gait path), deviates 6 - 10 in outside 12 in walkway width or uses assistive device    Gait and Pivot Turn Pivot turns safely in greater than 3 sec and stops with no loss of balance, or pivot turns safely within 3 sec and stops with mild imbalance, requires small steps to catch balance.    Step Over Obstacle Is able to step over one shoe box (4.5 in total height) without  changing gait speed. No evidence of imbalance.    Gait with Narrow Base of Support Ambulates less than 4 steps heel to toe or cannot perform without  assistance.    Gait with Eyes Closed Walks 20 ft, slow speed, abnormal gait pattern, evidence for imbalance, deviates 10-15 in outside 12 in walkway width. Requires more than 9 sec to ambulate 20 ft.    Ambulating Backwards Walks 20 ft, slow speed, abnormal gait pattern, evidence for imbalance, deviates 10-15 in outside 12 in walkway width.    Steps Alternating feet, must use rail.    Total Score 15    FGA comment: < 19 = high risk fall       THERAPEUTIC EXERCISE: To improve ROM and flexibility.  Demonstration, verbal and tactile cues throughout for technique.  Mod thomas hip flexor stretch with strap x 30 Hooklying HS stretch with strap x 30 Hooklying figure-4 piriformis stretch with light overpressure x 30 Hooklying KTOS piriformis stretch x 30 Standing lumbar extension at wall leaning on forearms 2 x 30   01/23/2024 - Eval SELF CARE:  Reviewed eval findings and role of PT in addressing identified deficits as well as instruction in initial HEP (see below).    PATIENT EDUCATION:  Education details: HEP update - standing hip flexor stretch and PWR! Moves - seated  Person educated: Patient Education method: Explanation, Demonstration, Verbal cues, Tactile cues, and Handouts Education comprehension: verbalized understanding, returned demonstration, verbal cues required, tactile cues required, and needs further education  HOME EXERCISE PROGRAM: Access Code: [email or web address] URL: [email or web address] Date: 02/02/2024 Prepared by: Elijah Hidden  Exercises - Hip Flexor Stretch with Strap on Table  - 2 x daily - 7 x weekly - 3 reps - 30 sec hold - Supine Hamstring Stretch with Strap  - 2 x daily - 7 x weekly - 3 reps - 30 sec hold - Supine Piriformis Stretch with Foot on Ground (Mirrored)  - 2 x daily - 7 x weekly - 3 reps - 30 sec hold - Supine Figure 4 Piriformis Stretch  - 2 x daily - 7 x weekly - 3 reps - 30 sec hold - Standing Lumbar Extension at Wall - Forearms  - 2 x  daily - 7 x weekly - 3 reps - 30 sec hold - Standing Hip Flexor Stretch  - 2 x daily - 7 x weekly - 3 reps - 30 sec hold  PWR! Moves: Seated   ASSESSMENT:  CLINICAL IMPRESSION: Mcclain Shall reports less issues with the lightheadedness today as he has been increasing his water  intake.  He notes feeling of weakness in LE, therefore initiated session with RTB resisted LE strengthening incorporating SLS for dynamic balance emphasis. Session concluded with review of seated PWR! Moves with rest breaks between movement patterns.  Utilized PWR! Up to assist with sit to stand transitions but still needed Airex pad to increase seat height in order to achieve standing w/o UE assist.  Farhaan Mabee will benefit from continued skilled PT to address ongoing flexibility, strength, coordination and balance deficits to improve mobility and activity tolerance with decreased risk for falls.   EVAL: Mickel Schreur is a 89 y.o. male who was referred to physical therapy for evaluation and treatment for Parkinson's disease. Patient first diagnosed with Parkinson's >15 years ago.  Most recent PT episode for Parkinson's disease was 04/06/2023 - 06/29/2023.  Since his last PT episode, he reports increased weakness and fatigue impacting ADLs and ambulation tolerance.  Patient presents with physical  impairments of decreased timing and coordination of gait, impaired ambulation, impaired standing balance, abnormal posture, bradykinesia with transfers, impaired activity tolerance, LE weakness, postural instability and decreased safety awareness impacting safe and independent functional mobility.  Examination revealed patient is at risk for falls and functional decline as evidenced by the following objective test measures: 5xSTS of 23.06 sec (>15 sec indicates increased risk for falls and decreased BLE power), Gait speed 3.16 ft/sec w/o AD and 3.04 ft/sec with single hiking pole (3.94 ft/sec is needed for safe community  ambulation when crossing streets), TUG of 13.10 sec (>13.5 sec indicates increased risk for falls), TUG Manual of 12.16 sec (difference between TUG manual and TUG >4.5 seconds indicates increased fall risk), and TUG cognitive of 16.97 sec (>/= 15 seconds indicates high risk for falls and community dwelling older adults).  TUG scores of >10% difference indicate difficulty with dual tasking.  Further testing of balance with Berg and FGA to be completed next visit.  ABC scale score of 63.1% indicates a moderate level of physical functioning, with scores of <69% indicating risk for recurrent falls in PD.  Wagner Tanzi will benefit from skilled PT to address above deficits to improve mobility and activity tolerance to help reach the maximal level of functional independence with mobility and gait with reduced risk for falls.  Patient demonstrates understanding of this POC and is in agreement with this plan.   OBJECTIVE IMPAIRMENTS: Abnormal gait, decreased activity tolerance, decreased balance, decreased coordination, decreased endurance, decreased knowledge of use of DME, decreased mobility, difficulty walking, decreased ROM, decreased strength, decreased safety awareness, impaired perceived functional ability, improper body mechanics, and postural dysfunction.   ACTIVITY LIMITATIONS: carrying, lifting, standing, squatting, stairs, transfers, bed mobility, reach over head, and locomotion level  PARTICIPATION LIMITATIONS: meal prep, cleaning, laundry, driving, shopping, and community activity  PERSONAL FACTORS: Age, Past/current experiences, Time since onset of injury/illness/exacerbation, and 3+ comorbidities: PPM 2 sick sinus syndrome/SA node dysfunction & complete heart block, orthostatic hypotension, COPD, OSA, HTN, DM2, Parkinson's Disease, CVA 03/2021, hypothyroidism, PTSD, bipolar I disorder, peripheral neuropathy  are also affecting patient's functional outcome.   REHAB POTENTIAL: Good  CLINICAL  DECISION MAKING: Evolving/moderate complexity  EVALUATION COMPLEXITY: Moderate   GOALS: Goals reviewed with patient? Yes  SHORT TERM GOALS: Target date: 02/20/2024  Complete remaining standardized balance assessment and update LTG's accordingly. Baseline: Berg and FGA TBA  Goal status: MET - 01/31/24  2.  Patient will be independent with initial HEP. Baseline: HEP initiated on eval Goal status: IN PROGRESS - 01/31/24 - added hip flexor stretch and seated PWR! Moves  3.  Patient will demonstrate improved B proximal LE strength to >/= 4+/5 for improved stability and ease of mobility. Baseline: Refer to above LE MMT table Goal status: INITIAL  4.  Patient will improve 5x STS time to </= 18 seconds to demonstrate improved functional strength and transfer efficiency. Baseline: 23.06 sec Goal status: INITIAL  LONG TERM GOALS: Target date: 03/19/2024   Patient will be independent with ongoing/advanced HEP for self-management at home incorporating PWR! Moves as indicated.  Baseline:  Goal status: INITIAL  2.  Patient will be able to ambulate at least 800 ft with LRAD including outdoor and unlevel surfaces, independently with no LOB, for improved community gait. .  Baseline:  Goal status: INITIAL  3.  Patient will be able to step up/down curb safely with LRAD for safety with community ambulation.  Baseline:  Goal status: INITIAL   4.  Patient will demonstrate  at least 4 point improvement on FGA or score of >/= 24/30 (baseline as of DC from last PT episode) to improve gait stability and reduce risk for falls.  Baseline: 01/31/24 - 15/30 Goal status: INITIAL  5.  Patient will improve Berg score by at least 8 points or score >/= 51/56 (baseline as of DC from last PT episode) to improve safety and stability with ADLs in standing and reduce risk for falls.   Baseline: 01/31/24 - 44/56 Goal status: INITIAL  6.  Patient will report >/= 70% on ABC scale to demonstrate improved balance  confidence and decreased risk for falls. Baseline: 1010 / 1600 = 63.1 % Goal status: INITIAL  7. Patient will verbalize understanding of local Parkinson's disease community resources, including community fitness post d/c. Baseline:  Goal status: INITIAL   PLAN:  PT FREQUENCY: 2x/week  PT DURATION: 8 weeks  PLANNED INTERVENTIONS: [id number]- PT Re-evaluation, 97750- Physical Performance Testing, 97110-Therapeutic exercises, 97530- Therapeutic activity, [id number]- Neuromuscular re-education, 97535- Self Care, [id number]- Manual therapy, [phone]- Gait training, Patient/Family education, Balance training, Stair training, DME instructions, Cryotherapy, and Moist heat  PLAN FOR NEXT SESSION: Progress proximal LE flexibility and initiate core/postural and proximal LE strengthening, update/modify HEP as indicated; progress PWR! Moves as tolerated   Elijah CHRISTELLA Hidden, PT 02/06/2024, 11:49 AM  "

## 2024-02-09 ENCOUNTER — Encounter: Payer: Self-pay | Admitting: Physical Therapy

## 2024-02-09 ENCOUNTER — Ambulatory Visit: Admitting: Physical Therapy

## 2024-02-09 DIAGNOSIS — R2689 Other abnormalities of gait and mobility: Secondary | ICD-10-CM

## 2024-02-09 DIAGNOSIS — G20A1 Parkinson's disease without dyskinesia, without mention of fluctuations: Secondary | ICD-10-CM

## 2024-02-09 DIAGNOSIS — M6281 Muscle weakness (generalized): Secondary | ICD-10-CM

## 2024-02-09 DIAGNOSIS — R2681 Unsteadiness on feet: Secondary | ICD-10-CM

## 2024-02-09 DIAGNOSIS — R293 Abnormal posture: Secondary | ICD-10-CM

## 2024-02-09 NOTE — Therapy (Signed)
 " OUTPATIENT PHYSICAL THERAPY TREATMENT   Patient Name: Victor Osborne MRN: [id number] DOB:[birth year], 89 y.o., male Today's Date: 02/09/2024   END OF SESSION:  PT End of Session - 02/09/24 1051     Visit Number 5    Date for Recertification  03/19/24    Authorization Type Medicare & [employer]    Progress Note Due on Visit 10    PT Start Time 1051    PT Stop Time 1136    PT Time Calculation (min) 45 min    Activity Tolerance Patient tolerated treatment well;Patient limited by fatigue    Behavior During Therapy WFL for tasks assessed/performed              Past Medical History:  Diagnosis Date   Bipolar 1 disorder (HCC)    COPD (chronic obstructive pulmonary disease) (HCC)    Depression    First degree AV block    Hypertension    pt denies but was in medical record   Hypoglycemia, unspecified    Myalgia and myositis, unspecified    Obesity    Pneumonia 1970   Skin cancer (melanoma) (HCC)    Thyroid  disease    Unspecified hypothyroidism    UPJ obstruction, congenital    Past Surgical History:  Procedure Laterality Date   APPENDECTOMY  1939   malignant melanoma removed from right forehead  2008   PERMANENT PACEMAKER INSERTION N/A 06/19/2013   Procedure: PERMANENT PACEMAKER INSERTION;  Surgeon: Danelle LELON Birmingham, MD;  Location: [facility] CATH LAB;  Service: Cardiovascular;  Laterality: N/A;   PPM GENERATOR CHANGEOUT N/A 09/28/2022   Procedure: PPM GENERATOR CHANGEOUT;  Surgeon: Birmingham Danelle LELON, MD;  Location: [facility] INVASIVE CV LAB;  Service: Cardiovascular;  Laterality: N/A;   right big toe  surgery  2009   TRANSURETHRAL RESECTION OF PROSTATE  02/04/2011   Procedure: TRANSURETHRAL RESECTION OF THE PROSTATE (TURP);  Surgeon: Noretta Ferrara, MD;  Location: [facility] ORS;  Service: Urology;  Laterality: N/A;   URETHRA SURGERY  2010   reconstruction   Patient Active Problem List   Diagnosis Date Noted   Hyperlipidemia 03/15/2022   TIA (transient ischemic attack) 03/14/2022    History of neoplasm 04/12/2021   Melanocytic nevi of trunk 04/12/2021   Other seborrheic keratosis 04/12/2021   Cerebral embolism with cerebral infarction 03/31/2021   Left leg weakness 03/30/2021   Chronic post-traumatic stress disorder 12/05/2017   Bipolar I disorder, current or most recent episode depressed, in partial remission (HCC) 12/05/2017   CHB (complete heart block) (HCC) 01/27/2017   HTN (hypertension) 01/27/2017   Hypothyroidism 10/11/2016   Parkinson disease (HCC) 09/08/2016   Chronic osteoarthritis 03/20/2015   Bipolar 1 disorder (HCC) 03/20/2015   Tremor 03/20/2015   Pacemaker 09/24/2013   SSS (sick sinus syndrome) (HCC) 06/19/2013   First degree AV block 04/09/2013   Allergic rhinitis 03/17/2013   Obstructive sleep apnea 12/18/2006   COPD mixed type (HCC) 12/18/2006   History of malignant melanoma of skin 12/18/2006    PCP: Almarie Birmingham NOVAK, NP   REFERRING PROVIDER: Evonnie Asberry RAMAN, DO   REFERRING DIAG: G20.A1 (ICD-10-CM) - Parkinson's disease without dyskinesia or fluctuating manifestations (HCC)   THERAPY DIAG:  Parkinson's disease without dyskinesia or fluctuating manifestations (HCC)  Other abnormalities of gait and mobility  Unsteadiness on feet  Abnormal posture  Muscle weakness (generalized)  RATIONALE FOR EVALUATION AND TREATMENT: Rehabilitation  ONSET DATE: Initial PD diagnosis >15 yrs ago   NEXT MD VISIT:  03/12/2024  SUBJECTIVE:                                                                                                                                                                                                         SUBJECTIVE STATEMENT: Pt states people have told him he seems to be breathing hard all the time, but he does not perceive this.    EVAL: Victor Osborne reports he gets fatigued quite easily.  Fatigue and weakness make ADLs more difficult and takes more concentration.  Getting up (sit to stand) takes more effort and  time to complete.  He denies any falls.  He uses his rollator mostly in his apartment but goes out with just a single hiking pole (lost the 2nd one).  Walking takes a lot of energy.  Does not feel as strong as he was.  PAIN: Are you having pain? No  PERTINENT HISTORY:  PPM 2 sick sinus syndrome/SA node dysfunction & complete heart block, orthostatic hypotension, COPD, OSA, HTN, DM2, Parkinson's Disease, CVA 03/2021, hypothyroidism, PTSD, bipolar I disorder, peripheral neuropathy   PRECAUTIONS: Fall and ICD/Pacemaker  RED FLAGS: None  WEIGHT BEARING RESTRICTIONS: No  FALLS:  Has patient fallen in last 6 months? 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   OBJECTIVE: (objective measures completed at initial evaluation unless otherwise dated)  DIAGNOSTIC FINDINGS:  12/04/23 - CT HEAD WITHOUT CONTRAST CLINICAL HISTORY: generalized weakness   FINDINGS:   BRAIN AND VENTRICLES: No acute hemorrhage. No evidence of acute infarct. No hydrocephalus. No extra-axial collection. No mass effect or midline shift. Mild age related volume loss. Minimal chronic microvascular ischemic changes.   ORBITS: Postsurgical changes of the globes.   SINUSES: Mucosal thickening in the ethmoid sinuses.   SOFT TISSUES AND SKULL: No acute soft tissue abnormality. No skull fracture.   IMPRESSION: 1. No acute intracranial abnormality.  COGNITION: Overall cognitive status: Within functional limits for tasks assessed, but he feels like his memory is sliding  some.   SENSATION: WFL Except neuropathy in his feet - most notable at night   COORDINATION: Gross motor WFL B UE/LE   POSTURE:  rounded shoulders, forward head, and  flexed trunk   UPPER EXTREMITY MMT:  MMT Right eval Left eval  Shoulder flexion    Shoulder extension    Shoulder abduction    Shoulder adduction    Shoulder extension    Shoulder internal rotation    Shoulder external rotation    Middle trapezius    Lower trapezius    Elbow flexion    Elbow extension    Wrist flexion    Wrist extension    Wrist ulnar deviation    Wrist radial deviation    Wrist pronation 4+ 5  Wrist supination 4+ 5  Grip strength 43.33# 56#   (Blank rows = not tested)   MUSCLE LENGTH: Hamstrings: mod tight B ITB: mild/mod tight B Piriformis: mod tight B Hip flexors: severe tight B Quads: mod tight B Heelcord: mod tight L, mild tight R   LOWER EXTREMITY ROM:    Limited related to muscle tightness as above - greatest limitation in B hip extension  LOWER EXTREMITY MMT:    MMT Right eval Left eval  Hip flexion 4+ 4+  Hip extension 4- ^ 4- ^  Hip abduction 4- 4-  Hip adduction 4 4  Hip internal rotation 4+ 4+  Hip external rotation 4 4  Knee flexion 5 5  Knee extension 5 5  Ankle dorsiflexion 4+ 4+  Ankle plantarflexion    Ankle inversion    Ankle eversion    (Blank rows = not tested)  BED MOBILITY:  Sit to supine Modified independence Supine to sit Modified independence Rolling to Right Modified independence Rolling to Left Modified independence  TRANSFERS: Assistive device utilized: Single hiking pole and None  Sit to stand: Modified independence Stand to sit: Modified independence Chair to chair: Modified independence Floor: NT  GAIT: Distance walked: Clinic distances Assistive device utilized: Single hiking pole on R Level of assistance: Modified independence Gait pattern: step through pattern, decreased arm swing- Right, decreased arm swing- Left, and  trunk flexed Comments: Patient reports he has been using his 4WW/rollator mostly in his apartment and single hiking pole or one of his canes when he goes out.  STAIRS:  Level of Assistance:   Stair Negotiation Technique:    Number of Stairs:    Height of Stairs:   Comments:   FUNCTIONAL TESTS:  5 times sit to stand: 23.06 sec w/o UE assist Timed up and go (TUG): 12.63 sec with single hiking pole W/o AD: 13.10 sec (Normal), 12.16 sec (Manual), 16.97 sec (Cognitive - counting back wards by 2's) 10 meter walk test: 10.38 sec w/o AD; 10.79 sec with single hiking pole Gait speed = 3.16 ft/sec w/o AD; 3.04 ft/sec with single hiking pole 01/31/24: Berg = 44/56, 37-45 significant (>80%) fall risk FGA = 15/30, < 19 = high risk fall   12/26/23 - PD Screen: 5 time sit to stand test: Only able to complete 3 reps w/o UE assist (21.00 sec); 14.75 sec with single UE assist on R Baseline as of D/C from last PT episode: 14.22 sec TUG - Timed Up and Go test: 12.78 sec with single hiking pole W/o AD: 13.75 sec (Normal), 12.06 sec (Manual), 12.32 sec(Cognitive - counting back wards by 1's) Baseline as of D/C from last PT episode: 10.62 sec (Normal), 11.21 sec (Manual), 13.25 sec (Cognitive - unable to complete cognitive task w/o multiple errors)  10 meter walk test: 9.19 sec w/o AD, 9.75 sec with single hiking pole  Baseline as of D/C from last PT episode: 8.50 sec  w/o AD, 8.16 sec with hiking pole  Gait speed: 3.54 ft/sec w/o AD, 3.36 ft/sec with hiking pole  Baseline as of D/C from last PT episode: 3.86 ft/sec w/o AD, 4.02 ft/sec with hiking pole    06/29/23 - DC from last PT episode: 5xSTS = 14.22 sec TUG): 10.62 sec (Normal), 11.21 sec (Manual), 13.25 sec (Cognitive - unable to complete cognitive task w/o multiple errors) = 8.50 sec w/o AD, 8.16 sec with hiking pole Gait speed = 3.86 ft/sec w/o AD, 4.02 ft/sec with hiking pole Berg = 51/56; 46-51 indicates moderate risk for falls (>50%)  FGA  = 24/30; 19-24 indicates medium risk fall   PATIENT SURVEYS:  ABC scale: The Activities-Specific Balance Confidence (ABC) Scale 0% 10 20 30  40 50 60 70 80 90 100% No confidence<->completely confident  How confident are you that you will not lose your balance or become unsteady when you . . .  Date tested 01/23/2024   Walk around the house 70%  2. Walk up or down stairs 70%  3. Bend over and pick up a slipper from in front of a closet floor 70%  4. Reach for a small can off a shelf at eye level 70%  5. Stand on tip toes and reach for something above your head 50%  6. Stand on a chair and reach for something 20%  7. Sweep the floor 70%  8. Walk outside the house to a car parked in the driveway 70%  9. Get into or out of a car 70%  10. Walk across a parking lot to the mall 70%  11. Walk up or down a ramp 60%  12. Walk in a crowded mall where people rapidly walk past you 70%  13. Are bumped into by people as you walk through the mall 60%  14. Step onto or off of an escalator while you are holding onto the railing 70%  15. Step onto or off an escalator while holding onto parcels such that you cannot hold onto the railing 60%  16. Walk outside on icy sidewalks 60%  Total: #/16  63.1 %     06/29/23 - DC from last PT episode: 1260 / 1600 = 78.8 %    TODAY'S TREATMENT:    02/09/2024  THERAPEUTIC EXERCISE: To improve strength, endurance, ROM, and flexibility.  Demonstration, verbal and tactile cues throughout for technique.  NuStep - L6 x 5' - UE/LE to promote reciprocal movement pattern Lumbar extension in standing at wall leaning on forearms 3 x 30 Standing hip flexor stretch with UE support on wall 2 x 30 B  THERAPEUTIC ACTIVITIES: To improve functional performance.  Demonstration, verbal and tactile cues throughout for technique. PWR! Sit to stand x 5 from standard chair height - cues to tuck feet in slightly toward chair and use momentum of fwd lean with PWR! Up for weight shift  over toes, carrying momentum over into rise to standing  5xSTS = 25.72 sec with hands from knees (increased effort on last 2 reps as pt not rocking and using momentum)  NEUROMUSCULAR RE-EDUCATION: To improve strength, coordination, kinesthesia, posture, proprioception, balance, reduce fall risk, and amplitude of movement. Standing Fitter hip extension (1 black/1 blue) 2 x 10 B, UE support on wall ladder Standing Fitter hip ABD (1 black/1 blue) x 10 B, UE support on wall ladder  VS at end of session (pt feeling a little lightheaded after stretches):  BP - 118/68 HR - 69 bpm SpO2 - 98%  02/06/2024  THERAPEUTIC EXERCISE: To improve strength, endurance, ROM, and flexibility.  Demonstration, verbal and tactile cues throughout for technique.  NuStep - L5 x 5' - UE/LE to promote reciprocal movement pattern Lumbar extension in standing at wall leaning on forearms 2 x 30 Standing hip flexor stretch with UE support on wall 2 x 30 B Seated RTB LAQ 2 x 10 B Seated RTB HS curl 2 x 10 B  NEUROMUSCULAR RE-EDUCATION: To improve strength, coordination, kinesthesia, posture, proprioception, balance, reduce fall risk, amplitude of movement, speed of movement to reduce bradykinesia, and reduce rigidity. SLS + opp RTB 4-way SLR x 10 each direction B - UE support between wall ladder, hiking pole and/or back of chair Seated PWR! Moves: Up x 10 Rock x 10 Twist x 10 Step x 10 PWR! Sit to stand x 5 from Airex pad in chair    02/02/2024  THERAPEUTIC ACTIVITIES:  Orthostatic BP assessment:  Time BP HR Symptoms  Lying down  NT    Sitting 1-2 min 118/68 61 SpO2 = 98%, asymptomatic  Standing 1-2 min 100/56  lightheaded    VS after 8 oz of water  and LE exercises: Orthostatic BP assessment:  Time BP HR Symptoms  Sitting 1-2 min 118/64  asymptomatic  Standing 1-2 min 110/60  asymptomatic    THERAPEUTIC EXERCISE: To improve strength, endurance, ROM, and flexibility.  Demonstration, verbal and tactile cues  throughout for technique.  Seated heel-toe raises x 20 Seated alt LAQ x 10 Seated march x 10 NuStep - L4 x 6' Lumbar extension in standing at wall leaning on forearms 8 x 10 Standing hip flexor stretch with UE support on wall 2 x 30 B  NEUROMUSCULAR RE-EDUCATION: To improve strength, coordination, kinesthesia, posture, amplitude of movement, speed of movement to reduce bradykinesia, and reduce rigidity. Seated PWR! Moves: Up x 10 Rock x 10 Twist x 10 Step x 5   01/31/2024  PHYSICAL PERFORMANCE TEST or MEASUREMENT:  Berg Balance Test   Sit to Stand Able to stand without using hands and stabilize independently    Standing Unsupported Able to stand safely 2 minutes    Sitting with Back Unsupported but Feet Supported on Floor or Stool Able to sit safely and securely 2 minutes    Stand to Sit Sits safely with minimal use of hands    Transfers Able to transfer safely, minor use of hands    Standing Unsupported with Eyes Closed Able to stand 10 seconds with supervision    Standing Unsupported with Feet Together Able to place feet together independently and stand for 1 minute with supervision    From Standing, Reach Forward with Outstretched Arm Can reach forward >12 cm safely (5)    From Standing Position, Pick up Object from Floor Able to pick up shoe safely and easily    From Standing Position, Turn to Look Behind Over each Shoulder Turn sideways only but maintains balance    Turn 360 Degrees Able to turn 360 degrees safely but slowly    Standing Unsupported, Alternately Place Feet on Step/Stool Able to stand independently and safely and complete 8 steps in 20 seconds    Standing Unsupported, One Foot in Front Able to take small step independently and hold 30 seconds    Standing on One Leg Tries to lift leg/unable to hold 3 seconds but remains standing independently    Total Score 44    Berg comment: 37-45 significant (>80%) fall risk     Functional Gait  Assessment  Gait Level  Surface Walks 20 ft in less than 7 sec but greater than 5.5 sec, uses assistive device, slower speed, mild gait deviations, or deviates 6-10 in outside of the 12 in walkway width.    Change in Gait Speed Able to change speed, demonstrates mild gait deviations, deviates 6-10 in outside of the 12 in walkway width, or no gait deviations, unable to achieve a major change in velocity, or uses a change in velocity, or uses an assistive device.    Gait with Horizontal Head Turns Performs head turns with moderate changes in gait velocity, slows down, deviates 10-15 in outside 12 in walkway width but recovers, can continue to walk.    Gait with Vertical Head Turns Performs task with slight change in gait velocity (eg, minor disruption to smooth gait path), deviates 6 - 10 in outside 12 in walkway width or uses assistive device    Gait and Pivot Turn Pivot turns safely in greater than 3 sec and stops with no loss of balance, or pivot turns safely within 3 sec and stops with mild imbalance, requires small steps to catch balance.    Step Over Obstacle Is able to step over one shoe box (4.5 in total height) without changing gait speed. No evidence of imbalance.    Gait with Narrow Base of Support Ambulates less than 4 steps heel to toe or cannot perform without assistance.    Gait with Eyes Closed Walks 20 ft, slow speed, abnormal gait pattern, evidence for imbalance, deviates 10-15 in outside 12 in walkway width. Requires more than 9 sec to ambulate 20 ft.    Ambulating Backwards Walks 20 ft, slow speed, abnormal gait pattern, evidence for imbalance, deviates 10-15 in outside 12 in walkway width.    Steps Alternating feet, must use rail.    Total Score 15    FGA comment: < 19 = high risk fall       THERAPEUTIC EXERCISE: To improve ROM and flexibility.  Demonstration, verbal and tactile cues throughout for technique.  Mod thomas hip flexor stretch with strap x 30 Hooklying HS stretch with strap x 30 Hooklying  figure-4 piriformis stretch with light overpressure x 30 Hooklying KTOS piriformis stretch x 30 Standing lumbar extension at wall leaning on forearms 2 x 30   01/23/2024 - Eval SELF CARE:  Reviewed eval findings and role of PT in addressing identified deficits as well as instruction in initial HEP (see below).    PATIENT EDUCATION:  Education details: HEP update - standing hip flexor stretch and PWR! Moves - seated  Person educated: Patient Education method: Explanation, Demonstration, Verbal cues, Tactile cues, and Handouts Education comprehension: verbalized understanding, returned demonstration, verbal cues required, tactile cues required, and needs further education  HOME EXERCISE PROGRAM: Access Code: [email or web address] URL: [email or web address] Date: 02/02/2024 Prepared by: Elijah Hidden  Exercises - Hip Flexor Stretch with Strap on Table  - 2 x daily - 7 x weekly - 3 reps - 30 sec hold - Supine Hamstring Stretch with Strap  - 2 x daily - 7 x weekly - 3 reps - 30 sec hold - Supine Piriformis Stretch with Foot on Ground (Mirrored)  - 2 x daily - 7 x weekly - 3 reps - 30 sec hold - Supine Figure 4 Piriformis Stretch  - 2 x daily - 7 x weekly - 3 reps - 30 sec hold - Standing Lumbar Extension at Wall - Forearms  - 2 x daily - 7 x weekly - 3  reps - 30 sec hold - Standing Hip Flexor Stretch  - 2 x daily - 7 x weekly - 3 reps - 30 sec hold  PWR! Moves: Seated   ASSESSMENT:  CLINICAL IMPRESSION: Focused on functional strength and movement patterns for increased ease of sit to stand transfers targeting hip extensors and abductors.  Victor Osborne was able to achieve sit to stand from a standard height chair with only hands on knees if he utilized the momentum from the [employer] Up patterns, however he struggled more when failing to use momentum requiring more than one trial to achieve upright stance. He continues to fatigue requiring intermittent seated rest breaks.  Victor Osborne continues  to feel like the lumbar extension and hip flexor stretches help with his posture and ability to stand upright but did note some lightheadedness following today's performance - VS WNL when assessed.  Victor Osborne will benefit from continued skilled PT to address ongoing flexibility, strength, coordination and balance deficits to improve mobility and activity tolerance with decreased risk for falls.   EVAL: Victor Osborne is a 89 y.o. male who was referred to physical therapy for evaluation and treatment for Parkinson's disease. Patient first diagnosed with Parkinson's >15 years ago.  Most recent PT episode for Parkinson's disease was 04/06/2023 - 06/29/2023.  Since his last PT episode, he reports increased weakness and fatigue impacting ADLs and ambulation tolerance.  Patient presents with physical impairments of decreased timing and coordination of gait, impaired ambulation, impaired standing balance, abnormal posture, bradykinesia with transfers, impaired activity tolerance, LE weakness, postural instability and decreased safety awareness impacting safe and independent functional mobility.  Examination revealed patient is at risk for falls and functional decline as evidenced by the following objective test measures: 5xSTS of 23.06 sec (>15 sec indicates increased risk for falls and decreased BLE power), Gait speed 3.16 ft/sec w/o AD and 3.04 ft/sec with single hiking pole (3.94 ft/sec is needed for safe community ambulation when crossing streets), TUG of 13.10 sec (>13.5 sec indicates increased risk for falls), TUG Manual of 12.16 sec (difference between TUG manual and TUG >4.5 seconds indicates increased fall risk), and TUG cognitive of 16.97 sec (>/= 15 seconds indicates high risk for falls and community dwelling older adults).  TUG scores of >10% difference indicate difficulty with dual tasking.  Further testing of balance with Berg and FGA to be completed next visit.  ABC scale score of 63.1% indicates a moderate  level of physical functioning, with scores of <69% indicating risk for recurrent falls in PD.  Victor Osborne will benefit from skilled PT to address above deficits to improve mobility and activity tolerance to help reach the maximal level of functional independence with mobility and gait with reduced risk for falls.  Patient demonstrates understanding of this POC and is in agreement with this plan.   OBJECTIVE IMPAIRMENTS: Abnormal gait, decreased activity tolerance, decreased balance, decreased coordination, decreased endurance, decreased knowledge of use of DME, decreased mobility, difficulty walking, decreased ROM, decreased strength, decreased safety awareness, impaired perceived functional ability, improper body mechanics, and postural dysfunction.   ACTIVITY LIMITATIONS: carrying, lifting, standing, squatting, stairs, transfers, bed mobility, reach over head, and locomotion level  PARTICIPATION LIMITATIONS: meal prep, cleaning, laundry, driving, shopping, and community activity  PERSONAL FACTORS: Age, Past/current experiences, Time since onset of injury/illness/exacerbation, and 3+ comorbidities: PPM 2 sick sinus syndrome/SA node dysfunction & complete heart block, orthostatic hypotension, COPD, OSA, HTN, DM2, Parkinson's Disease, CVA 03/2021, hypothyroidism, PTSD, bipolar I disorder, peripheral neuropathy  are  also affecting patient's functional outcome.   REHAB POTENTIAL: Good  CLINICAL DECISION MAKING: Evolving/moderate complexity  EVALUATION COMPLEXITY: Moderate   GOALS: Goals reviewed with patient? Yes  SHORT TERM GOALS: Target date: 02/20/2024  Complete remaining standardized balance assessment and update LTG's accordingly. Baseline: Berg and FGA TBA  Goal status: MET - 01/31/24  2.  Patient will be independent with initial HEP. Baseline: HEP initiated on eval Goal status: IN PROGRESS - 01/31/24 - added hip flexor stretch and seated PWR! Moves  3.  Patient will demonstrate  improved B proximal LE strength to >/= 4+/5 for improved stability and ease of mobility. Baseline: Refer to above LE MMT table Goal status: INITIAL  4.  Patient will improve 5xSTS time to </= 18 seconds to demonstrate improved functional strength and transfer efficiency. Baseline: 23.06 sec Goal status: IN PROGRESS - 02/09/24 - 5xSTS = 25.72 sec with hands from knees (increased effort on last 2 reps as pt not rocking and using momentum)  LONG TERM GOALS: Target date: 03/19/2024   Patient will be independent with ongoing/advanced HEP for self-management at home incorporating PWR! Moves as indicated.  Baseline:  Goal status: INITIAL  2.  Patient will be able to ambulate at least 800 ft with LRAD including outdoor and unlevel surfaces, independently with no LOB, for improved community gait. .  Baseline:  Goal status: INITIAL  3.  Patient will be able to step up/down curb safely with LRAD for safety with community ambulation.  Baseline:  Goal status: INITIAL   4.  Patient will demonstrate at least 4 point improvement on FGA or score of >/= 24/30 (baseline as of DC from last PT episode) to improve gait stability and reduce risk for falls.  Baseline: 01/31/24 - 15/30 Goal status: INITIAL  5.  Patient will improve Berg score by at least 8 points or score >/= 51/56 (baseline as of DC from last PT episode) to improve safety and stability with ADLs in standing and reduce risk for falls.   Baseline: 01/31/24 - 44/56 Goal status: INITIAL  6.  Patient will report >/= 70% on ABC scale to demonstrate improved balance confidence and decreased risk for falls. Baseline: 1010 / 1600 = 63.1 % Goal status: INITIAL  7. Patient will verbalize understanding of local Parkinson's disease community resources, including community fitness post d/c. Baseline:  Goal status: INITIAL   PLAN:  PT FREQUENCY: 2x/week  PT DURATION: 8 weeks  PLANNED INTERVENTIONS: 97164- PT Re-evaluation, 97750- Physical  Performance Testing, 97110-Therapeutic exercises, 97530- Therapeutic activity, [id number]- Neuromuscular re-education, 97535- Self Care, [id number]- Manual therapy, [phone]- Gait training, Patient/Family education, Balance training, Stair training, DME instructions, Cryotherapy, and Moist heat  PLAN FOR NEXT SESSION: Progress proximal LE flexibility and initiate core/postural and proximal LE strengthening, update/modify HEP as indicated; progress PWR! Moves as tolerated   Elijah CHRISTELLA Hidden, PT 02/09/2024, 12:17 PM  "

## 2024-02-13 ENCOUNTER — Ambulatory Visit: Admitting: Physical Therapy

## 2024-02-13 ENCOUNTER — Telehealth: Payer: Self-pay | Admitting: Physical Therapy

## 2024-02-13 NOTE — Telephone Encounter (Signed)
 PT called Victor Osborne regarding no show for today's appointment at 11:00.  Message left reminding him of his next appointment on Friday, 02/16/24 at 11:00 requesting a call back to cancel if he will be unable to attend.    Victor Osborne, PT 02/13/2024, 11:27 AM  [facility] [address]  [address] [place], [place], [address] Phone: [phone]   Fax:  [phone]

## 2024-02-16 ENCOUNTER — Ambulatory Visit: Admitting: Physical Therapy

## 2024-02-19 ENCOUNTER — Other Ambulatory Visit: Payer: Self-pay | Admitting: Family

## 2024-02-20 ENCOUNTER — Telehealth: Payer: Self-pay | Admitting: Physical Therapy

## 2024-02-20 ENCOUNTER — Ambulatory Visit: Admitting: Physical Therapy

## 2024-02-20 NOTE — Telephone Encounter (Signed)
 PT called Victor Osborne regarding no show for today's appointment at 11:00.  Message left reminding him of his next appointment on Tuesday, 02/27/24 at 11:45 requesting a call back to cancel if he will be unable to attend.  Patient also offered a slot on Friday 02/23/24 at 10:15 and requested he call back to confirm or decline.   Victor Osborne, PT 02/20/2024, 11:29 AM  [facility] [address]  [address] [place], [place], [address] Phone: [phone]   Fax:  [phone]

## 2024-02-23 ENCOUNTER — Ambulatory Visit: Attending: Neurology | Admitting: Physical Therapy

## 2024-02-23 ENCOUNTER — Encounter: Payer: Self-pay | Admitting: Physical Therapy

## 2024-02-23 DIAGNOSIS — R293 Abnormal posture: Secondary | ICD-10-CM

## 2024-02-23 DIAGNOSIS — R2689 Other abnormalities of gait and mobility: Secondary | ICD-10-CM

## 2024-02-23 DIAGNOSIS — R2681 Unsteadiness on feet: Secondary | ICD-10-CM

## 2024-02-23 DIAGNOSIS — G20A1 Parkinson's disease without dyskinesia, without mention of fluctuations: Secondary | ICD-10-CM

## 2024-02-23 DIAGNOSIS — M6281 Muscle weakness (generalized): Secondary | ICD-10-CM

## 2024-02-23 NOTE — Therapy (Signed)
 " OUTPATIENT PHYSICAL THERAPY TREATMENT   Patient Name: Victor Osborne MRN: [id number] DOB:[birth year], 89 y.o., male Today's Date: 02/23/2024   END OF SESSION:  PT End of Session - 02/23/24 1025     Visit Number 6    Date for Recertification  03/19/24    Authorization Type Medicare & [employer]    Progress Note Due on Visit 10    PT Start Time 1025   Pt arrived late   PT Stop Time 1103    PT Time Calculation (min) 38 min    Activity Tolerance Patient tolerated treatment well;Patient limited by fatigue    Behavior During Therapy WFL for tasks assessed/performed               Past Medical History:  Diagnosis Date   Bipolar 1 disorder (HCC)    COPD (chronic obstructive pulmonary disease) (HCC)    Depression    First degree AV block    Hypertension    pt denies but was in medical record   Hypoglycemia, unspecified    Myalgia and myositis, unspecified    Obesity    Pneumonia 1970   Skin cancer (melanoma) (HCC)    Thyroid  disease    Unspecified hypothyroidism    UPJ obstruction, congenital    Past Surgical History:  Procedure Laterality Date   APPENDECTOMY  1939   malignant melanoma removed from right forehead  2008   PERMANENT PACEMAKER INSERTION N/A 06/19/2013   Procedure: PERMANENT PACEMAKER INSERTION;  Surgeon: Victor Osborne Birmingham, MD;  Location: [facility] CATH LAB;  Service: Cardiovascular;  Laterality: N/A;   PPM GENERATOR CHANGEOUT N/A 09/28/2022   Procedure: PPM GENERATOR CHANGEOUT;  Surgeon: Osborne Victor LELON, MD;  Location: [facility] INVASIVE CV LAB;  Service: Cardiovascular;  Laterality: N/A;   right big toe  surgery  2009   TRANSURETHRAL RESECTION OF PROSTATE  02/04/2011   Procedure: TRANSURETHRAL RESECTION OF THE PROSTATE (TURP);  Surgeon: Victor Ferrara, MD;  Location: [facility] ORS;  Service: Urology;  Laterality: N/A;   URETHRA SURGERY  2010   reconstruction   Patient Active Problem List   Diagnosis Date Noted   Hyperlipidemia 03/15/2022   TIA (transient ischemic attack)  03/14/2022   History of neoplasm 04/12/2021   Melanocytic nevi of trunk 04/12/2021   Other seborrheic keratosis 04/12/2021   Cerebral embolism with cerebral infarction 03/31/2021   Left leg weakness 03/30/2021   Chronic post-traumatic stress disorder 12/05/2017   Bipolar I disorder, current or most recent episode depressed, in partial remission (HCC) 12/05/2017   CHB (complete heart block) (HCC) 01/27/2017   HTN (hypertension) 01/27/2017   Hypothyroidism 10/11/2016   Parkinson disease (HCC) 09/08/2016   Chronic osteoarthritis 03/20/2015   Bipolar 1 disorder (HCC) 03/20/2015   Tremor 03/20/2015   Pacemaker 09/24/2013   SSS (sick sinus syndrome) (HCC) 06/19/2013   First degree AV block 04/09/2013   Allergic rhinitis 03/17/2013   Obstructive sleep apnea 12/18/2006   COPD mixed type (HCC) 12/18/2006   History of malignant melanoma of skin 12/18/2006    PCP: Victor Osborne NOVAK, NP   REFERRING PROVIDER: Evonnie Asberry RAMAN, DO   REFERRING DIAG: G20.A1 (ICD-10-CM) - Parkinson's disease without dyskinesia or fluctuating manifestations (HCC)   THERAPY DIAG:  Parkinson's disease without dyskinesia or fluctuating manifestations (HCC)  Other abnormalities of gait and mobility  Unsteadiness on feet  Abnormal posture  Muscle weakness (generalized)  RATIONALE FOR EVALUATION AND TREATMENT: Rehabilitation  ONSET DATE: Initial PD diagnosis >15 yrs ago   NEXT  MD VISIT:  03/12/2024    SUBJECTIVE:                                                                                                                                                                                                         SUBJECTIVE STATEMENT: Pt returning for the 1st time in 2 weeks due to repeated inclement weather.  He states he feels more fatigued today after rushing to get to PT.  He reports a fall last night while attempting to come back in the house after letting the dogs out - he did hit his head (R frontal) but did  not lose consciousness and did not go get checked out.   EVAL: Victor Osborne reports he gets fatigued quite easily.  Fatigue and weakness make ADLs more difficult and takes more concentration.  Getting up (sit to stand) takes more effort and time to complete.  He denies any falls.  He uses his rollator mostly in his apartment but goes out with just a single hiking pole (lost the 2nd one).  Walking takes a lot of energy.  Does not feel as strong as he was.  PAIN: Are you having pain? No  PERTINENT HISTORY:  PPM 2 sick sinus syndrome/SA node dysfunction & complete heart block, orthostatic hypotension, COPD, OSA, HTN, DM2, Parkinson's Disease, CVA 03/2021, hypothyroidism, PTSD, bipolar I disorder, peripheral neuropathy   PRECAUTIONS: Fall and ICD/Pacemaker  RED FLAGS: None  WEIGHT BEARING RESTRICTIONS: No  FALLS:  Has patient fallen in last 6 months? 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   OBJECTIVE: (objective measures completed at initial evaluation unless otherwise dated)  DIAGNOSTIC FINDINGS:  12/04/23 - CT HEAD WITHOUT CONTRAST CLINICAL HISTORY: generalized weakness   FINDINGS:   BRAIN AND VENTRICLES: No acute hemorrhage. No evidence of acute infarct. No hydrocephalus. No extra-axial collection. No mass effect or midline shift. Mild age related volume loss. Minimal chronic microvascular ischemic changes.   ORBITS: Postsurgical changes of the globes.   SINUSES: Mucosal thickening in the ethmoid sinuses.   SOFT TISSUES AND SKULL: No acute soft  tissue abnormality. No skull fracture.   IMPRESSION: 1. No acute  intracranial abnormality.  COGNITION: Overall cognitive status: Within functional limits for tasks assessed, but he feels like his memory is sliding some.   SENSATION: WFL Except neuropathy in his feet - most notable at night   COORDINATION: Gross motor WFL B UE/LE   POSTURE:  rounded shoulders, forward head, and flexed trunk   UPPER EXTREMITY MMT:  MMT Right eval Left eval  Shoulder flexion    Shoulder extension    Shoulder abduction    Shoulder adduction    Shoulder extension    Shoulder internal rotation    Shoulder external rotation    Middle trapezius    Lower trapezius    Elbow flexion    Elbow extension    Wrist flexion    Wrist extension    Wrist ulnar deviation    Wrist radial deviation    Wrist pronation 4+ 5  Wrist supination 4+ 5  Grip strength 43.33# 56#   (Blank rows = not tested)   MUSCLE LENGTH: Hamstrings: mod tight B ITB: mild/mod tight B Piriformis: mod tight B Hip flexors: severe tight B Quads: mod tight B Heelcord: mod tight L, mild tight R   LOWER EXTREMITY ROM:    Limited related to muscle tightness as above - greatest limitation in B hip extension  LOWER EXTREMITY MMT:    MMT Right eval Left eval  Hip flexion 4+ 4+  Hip extension 4- ^ 4- ^  Hip abduction 4- 4-  Hip adduction 4 4  Hip internal rotation 4+ 4+  Hip external rotation 4 4  Knee flexion 5 5  Knee extension 5 5  Ankle dorsiflexion 4+ 4+  Ankle plantarflexion    Ankle inversion    Ankle eversion    (Blank rows = not tested)  BED MOBILITY:  Sit to supine Modified independence Supine to sit Modified independence Rolling to Right Modified independence Rolling to Left Modified independence  TRANSFERS: Assistive device utilized: Single hiking pole and None  Sit to stand: Modified independence Stand to sit: Modified independence Chair to chair: Modified independence Floor: NT  GAIT: Distance  walked: Clinic distances Assistive device utilized: Single hiking pole on R Level of assistance: Modified independence Gait pattern: step through pattern, decreased arm swing- Right, decreased arm swing- Left, and trunk flexed Comments: Patient reports he has been using his 4WW/rollator mostly in his apartment and single hiking pole or one of his canes when he goes out.  STAIRS:  Level of Assistance:   Stair Negotiation Technique:    Number of Stairs:    Height of Stairs:   Comments:   FUNCTIONAL TESTS:  5 times sit to stand: 23.06 sec w/o UE assist Timed up and go (TUG): 12.63 sec with single hiking pole W/o AD: 13.10 sec (Normal), 12.16 sec (Manual), 16.97 sec (Cognitive - counting back wards by 2's) 10 meter walk test: 10.38 sec w/o AD; 10.79 sec with single hiking pole Gait speed = 3.16 ft/sec w/o AD; 3.04 ft/sec with single hiking pole 01/31/24: Berg = 44/56, 37-45 significant (>80%) fall risk FGA = 15/30, < 19 = high risk fall   12/26/23 - PD Screen: 5 time sit to stand test: Only able to complete 3 reps w/o UE assist (21.00 sec); 14.75 sec with single UE assist on R Baseline as of D/C from last PT episode: 14.22 sec TUG - Timed Up and Go test: 12.78 sec with single hiking pole W/o AD: 13.75 sec (Normal), 12.06 sec (Manual), 12.32 sec(Cognitive - counting back wards by  1's) Baseline as of D/C from last PT episode: 10.62 sec (Normal), 11.21 sec (Manual), 13.25 sec (Cognitive - unable to complete cognitive task w/o multiple errors)  10 meter walk test: 9.19 sec w/o AD, 9.75 sec with single hiking pole  Baseline as of D/C from last PT episode: 8.50 sec w/o AD, 8.16 sec with hiking pole  Gait speed: 3.54 ft/sec w/o AD, 3.36 ft/sec with hiking pole  Baseline as of D/C from last PT episode: 3.86 ft/sec w/o AD, 4.02 ft/sec with hiking pole    06/29/23 - DC from last PT episode: 5xSTS = 14.22 sec TUG): 10.62 sec (Normal), 11.21 sec (Manual), 13.25 sec (Cognitive - unable to complete  cognitive task w/o multiple errors) = 8.50 sec w/o AD, 8.16 sec with hiking pole Gait speed = 3.86 ft/sec w/o AD, 4.02 ft/sec with hiking pole Berg = 51/56; 46-51 indicates moderate risk for falls (>50%)  FGA = 24/30; 19-24 indicates medium risk fall   PATIENT SURVEYS:  ABC scale: The Activities-Specific Balance Confidence (ABC) Scale 0% 10 20 30  40 50 60 70 80 90 100% No confidence<->completely confident  How confident are you that you will not lose your balance or become unsteady when you . . .  Date tested 01/23/2024   Walk around the house 70%  2. Walk up or down stairs 70%  3. Bend over and pick up a slipper from in front of a closet floor 70%  4. Reach for a small can off a shelf at eye level 70%  5. Stand on tip toes and reach for something above your head 50%  6. Stand on a chair and reach for something 20%  7. Sweep the floor 70%  8. Walk outside the house to a car parked in the driveway 70%  9. Get into or out of a car 70%  10. Walk across a parking lot to the mall 70%  11. Walk up or down a ramp 60%  12. Walk in a crowded mall where people rapidly walk past you 70%  13. Are bumped into by people as you walk through the mall 60%  14. Step onto or off of an escalator while you are holding onto the railing 70%  15. Step onto or off an escalator while holding onto parcels such that you cannot hold onto the railing 60%  16. Walk outside on icy sidewalks 60%  Total: #/16  63.1 %     06/29/23 - DC from last PT episode: 1260 / 1600 = 78.8 %    TODAY'S TREATMENT:    02/23/2024  THERAPEUTIC EXERCISE: To improve strength, endurance, ROM, and flexibility.  Demonstration, verbal and tactile cues throughout for technique.  NuStep - L4 x 5' - UE/LE to promote reciprocal movement pattern Seated hip hinge HS stretches 2 x 30 B Seated lunge position hip flexor stretches over corner of mat table 2 x 30 B Standing hip flexor stretch with UE support on wall 2 x 30 B Lumbar  extension in standing at wall leaning on forearms 3 x 30 Grip strengthening: Towel roll squeezes Towel roll twisting Red theraputty grip squeezes, pinches, twists   02/09/2024  THERAPEUTIC EXERCISE: To improve strength, endurance, ROM, and flexibility.  Demonstration, verbal and tactile cues throughout for technique.  NuStep - L6 x 5' - UE/LE to promote reciprocal movement pattern Lumbar extension in standing at wall leaning on forearms 3 x 30 Standing hip flexor stretch with UE support on wall 2 x 30 B  THERAPEUTIC ACTIVITIES: To improve functional performance.  Demonstration, verbal and tactile cues throughout for technique. PWR! Sit to stand x 5 from standard chair height - cues to tuck feet in slightly toward chair and use momentum of fwd lean with PWR! Up for weight shift over toes, carrying momentum over into rise to standing  5xSTS = 25.72 sec with hands from knees (increased effort on last 2 reps as pt not rocking and using momentum)  NEUROMUSCULAR RE-EDUCATION: To improve strength, coordination, kinesthesia, posture, proprioception, balance, reduce fall risk, and amplitude of movement. Standing Fitter hip extension (1 black/1 blue) 2 x 10 B, UE support on wall ladder Standing Fitter hip ABD (1 black/1 blue) x 10 B, UE support on wall ladder  VS at end of session (pt feeling a little lightheaded after stretches):  BP - 118/68 HR - 69 bpm SpO2 - 98%   02/06/2024  THERAPEUTIC EXERCISE: To improve strength, endurance, ROM, and flexibility.  Demonstration, verbal and tactile cues throughout for technique.  NuStep - L5 x 5' - UE/LE to promote reciprocal movement pattern Lumbar extension in standing at wall leaning on forearms 2 x 30 Standing hip flexor stretch with UE support on wall 2 x 30 B Seated RTB LAQ 2 x 10 B Seated RTB HS curl 2 x 10 B  NEUROMUSCULAR RE-EDUCATION: To improve strength, coordination, kinesthesia, posture, proprioception, balance, reduce fall risk,  amplitude of movement, speed of movement to reduce bradykinesia, and reduce rigidity. SLS + opp RTB 4-way SLR x 10 each direction B - UE support between wall ladder, hiking pole and/or back of chair Seated PWR! Moves: Up x 10 Rock x 10 Twist x 10 Step x 10 PWR! Sit to stand x 5 from Airex pad in chair    02/02/2024  THERAPEUTIC ACTIVITIES:  Orthostatic BP assessment:  Time BP HR Symptoms  Lying down  NT    Sitting 1-2 min 118/68 61 SpO2 = 98%, asymptomatic  Standing 1-2 min 100/56  lightheaded    VS after 8 oz of water  and LE exercises: Orthostatic BP assessment:  Time BP HR Symptoms  Sitting 1-2 min 118/64  asymptomatic  Standing 1-2 min 110/60  asymptomatic    THERAPEUTIC EXERCISE: To improve strength, endurance, ROM, and flexibility.  Demonstration, verbal and tactile cues throughout for technique.  Seated heel-toe raises x 20 Seated alt LAQ x 10 Seated march x 10 NuStep - L4 x 6' Lumbar extension in standing at wall leaning on forearms 8 x 10 Standing hip flexor stretch with UE support on wall 2 x 30 B  NEUROMUSCULAR RE-EDUCATION: To improve strength, coordination, kinesthesia, posture, amplitude of movement, speed of movement to reduce bradykinesia, and reduce rigidity. Seated PWR! Moves: Up x 10 Rock x 10 Twist x 10 Step x 5   01/31/2024  PHYSICAL PERFORMANCE TEST or MEASUREMENT:  Berg Balance Test   Sit to Stand Able to stand without using hands and stabilize independently    Standing Unsupported Able to stand safely 2 minutes    Sitting with Back Unsupported but Feet Supported on Floor or Stool Able to sit safely and securely 2 minutes    Stand to Sit Sits safely with minimal use of hands    Transfers Able to transfer safely, minor use of hands    Standing Unsupported with Eyes Closed Able to stand 10 seconds with supervision    Standing Unsupported with Feet Together Able to place feet together independently and stand for 1 minute with supervision  From  Standing, Reach Forward with Outstretched Arm Can reach forward >12 cm safely (5)    From Standing Position, Pick up Object from Floor Able to pick up shoe safely and easily    From Standing Position, Turn to Look Behind Over each Shoulder Turn sideways only but maintains balance    Turn 360 Degrees Able to turn 360 degrees safely but slowly    Standing Unsupported, Alternately Place Feet on Step/Stool Able to stand independently and safely and complete 8 steps in 20 seconds    Standing Unsupported, One Foot in Front Able to take small step independently and hold 30 seconds    Standing on One Leg Tries to lift leg/unable to hold 3 seconds but remains standing independently    Total Score 44    Berg comment: 37-45 significant (>80%) fall risk     Functional Gait  Assessment   Gait Level Surface Walks 20 ft in less than 7 sec but greater than 5.5 sec, uses assistive device, slower speed, mild gait deviations, or deviates 6-10 in outside of the 12 in walkway width.    Change in Gait Speed Able to change speed, demonstrates mild gait deviations, deviates 6-10 in outside of the 12 in walkway width, or no gait deviations, unable to achieve a major change in velocity, or uses a change in velocity, or uses an assistive device.    Gait with Horizontal Head Turns Performs head turns with moderate changes in gait velocity, slows down, deviates 10-15 in outside 12 in walkway width but recovers, can continue to walk.    Gait with Vertical Head Turns Performs task with slight change in gait velocity (eg, minor disruption to smooth gait path), deviates 6 - 10 in outside 12 in walkway width or uses assistive device    Gait and Pivot Turn Pivot turns safely in greater than 3 sec and stops with no loss of balance, or pivot turns safely within 3 sec and stops with mild imbalance, requires small steps to catch balance.    Step Over Obstacle Is able to step over one shoe box (4.5 in total height) without changing gait  speed. No evidence of imbalance.    Gait with Narrow Base of Support Ambulates less than 4 steps heel to toe or cannot perform without assistance.    Gait with Eyes Closed Walks 20 ft, slow speed, abnormal gait pattern, evidence for imbalance, deviates 10-15 in outside 12 in walkway width. Requires more than 9 sec to ambulate 20 ft.    Ambulating Backwards Walks 20 ft, slow speed, abnormal gait pattern, evidence for imbalance, deviates 10-15 in outside 12 in walkway width.    Steps Alternating feet, must use rail.    Total Score 15    FGA comment: < 19 = high risk fall       THERAPEUTIC EXERCISE: To improve ROM and flexibility.  Demonstration, verbal and tactile cues throughout for technique.  Mod thomas hip flexor stretch with strap x 30 Hooklying HS stretch with strap x 30 Hooklying figure-4 piriformis stretch with light overpressure x 30 Hooklying KTOS piriformis stretch x 30 Standing lumbar extension at wall leaning on forearms 2 x 30   01/23/2024 - Eval SELF CARE:  Reviewed eval findings and role of PT in addressing identified deficits as well as instruction in initial HEP (see below).    PATIENT EDUCATION:  Education details: HEP update - standing hip flexor stretch and PWR! Moves - seated  Person educated: Patient Education method:  Explanation, Demonstration, Verbal cues, Tactile cues, and Handouts Education comprehension: verbalized understanding, returned demonstration, verbal cues required, tactile cues required, and needs further education  HOME EXERCISE PROGRAM: Access Code: [email or web address] URL: [email or web address] Date: 02/02/2024 Prepared by: Elijah Hidden  Exercises - Hip Flexor Stretch with Strap on Table  - 2 x daily - 7 x weekly - 3 reps - 30 sec hold - Supine Hamstring Stretch with Strap  - 2 x daily - 7 x weekly - 3 reps - 30 sec hold - Supine Piriformis Stretch with Foot on Ground (Mirrored)  - 2 x daily - 7 x weekly - 3 reps - 30 sec hold - Supine  Figure 4 Piriformis Stretch  - 2 x daily - 7 x weekly - 3 reps - 30 sec hold - Standing Lumbar Extension at Wall - Forearms  - 2 x daily - 7 x weekly - 3 reps - 30 sec hold - Standing Hip Flexor Stretch  - 2 x daily - 7 x weekly - 3 reps - 30 sec hold  PWR! Moves: Seated   ASSESSMENT:  CLINICAL IMPRESSION: Iram Lundberg returns to PT after 2 week absence d/t inclement weather.  He reports a fall where he hit his head yesterday while taking the dogs out but denies any LOC or dizziness and did not seek medical f/u.  Instructed him to seek immediate medical attention if her were to experience any dizziness, lightheadedness, changes in vision, nausea or other symptoms of head injury, with reminder provided at end of session.  LE exercises limited to stretches today at patient request to work out some stiffness and soreness.  He notes worsening grip/pinch weakness recently, therefore provided instructions in grip/pinch strengthening using towel and red theraputty, with red putty provided for home use.  He is scheduled for a follow-up with Dr. Evonnie next week, therefore we will plan for reassessment of remainder of STGs and standardized balance testing next visit in preparation for progress note for MD visit.  Peter will benefit from continued skilled PT to address ongoing flexibility, strength, coordination and balance deficits to improve mobility and activity tolerance with decreased risk for falls.   EVAL: Jule Schlabach is a 89 y.o. male who was referred to physical therapy for evaluation and treatment for Parkinson's disease. Patient first diagnosed with Parkinson's >15 years ago.  Most recent PT episode for Parkinson's disease was 04/06/2023 - 06/29/2023.  Since his last PT episode, he reports increased weakness and fatigue impacting ADLs and ambulation tolerance.  Patient presents with physical impairments of decreased timing and coordination of gait, impaired ambulation, impaired standing balance,  abnormal posture, bradykinesia with transfers, impaired activity tolerance, LE weakness, postural instability and decreased safety awareness impacting safe and independent functional mobility.  Examination revealed patient is at risk for falls and functional decline as evidenced by the following objective test measures: 5xSTS of 23.06 sec (>15 sec indicates increased risk for falls and decreased BLE power), Gait speed 3.16 ft/sec w/o AD and 3.04 ft/sec with single hiking pole (3.94 ft/sec is needed for safe community ambulation when crossing streets), TUG of 13.10 sec (>13.5 sec indicates increased risk for falls), TUG Manual of 12.16 sec (difference between TUG manual and TUG >4.5 seconds indicates increased fall risk), and TUG cognitive of 16.97 sec (>/= 15 seconds indicates high risk for falls and community dwelling older adults).  TUG scores of >10% difference indicate difficulty with dual tasking.  Further testing of balance with Berg and FGA to be completed  next visit.  ABC scale score of 63.1% indicates a moderate level of physical functioning, with scores of <69% indicating risk for recurrent falls in PD.  Tyjai Matuszak will benefit from skilled PT to address above deficits to improve mobility and activity tolerance to help reach the maximal level of functional independence with mobility and gait with reduced risk for falls.  Patient demonstrates understanding of this POC and is in agreement with this plan.   OBJECTIVE IMPAIRMENTS: Abnormal gait, decreased activity tolerance, decreased balance, decreased coordination, decreased endurance, decreased knowledge of use of DME, decreased mobility, difficulty walking, decreased ROM, decreased strength, decreased safety awareness, impaired perceived functional ability, improper body mechanics, and postural dysfunction.   ACTIVITY LIMITATIONS: carrying, lifting, standing, squatting, stairs, transfers, bed mobility, reach over head, and locomotion  level  PARTICIPATION LIMITATIONS: meal prep, cleaning, laundry, driving, shopping, and community activity  PERSONAL FACTORS: Age, Past/current experiences, Time since onset of injury/illness/exacerbation, and 3+ comorbidities: PPM 2 sick sinus syndrome/SA node dysfunction & complete heart block, orthostatic hypotension, COPD, OSA, HTN, DM2, Parkinson's Disease, CVA 03/2021, hypothyroidism, PTSD, bipolar I disorder, peripheral neuropathy  are also affecting patient's functional outcome.   REHAB POTENTIAL: Good  CLINICAL DECISION MAKING: Evolving/moderate complexity  EVALUATION COMPLEXITY: Moderate   GOALS: Goals reviewed with patient? Yes  SHORT TERM GOALS: Target date: 02/20/2024  Complete remaining standardized balance assessment and update LTG's accordingly. Baseline: Berg and FGA TBA  Goal status: MET - 01/31/24  2.  Patient will be independent with initial HEP. Baseline: HEP initiated on eval 01/31/24 - added hip flexor stretch and seated PWR! Moves Goal status: MET - 02/23/24  3.  Patient will demonstrate improved B proximal LE strength to >/= 4+/5 for improved stability and ease of mobility. Baseline: Refer to above LE MMT table Goal status: INITIAL  4.  Patient will improve 5xSTS time to </= 18 seconds to demonstrate improved functional strength and transfer efficiency. Baseline: 23.06 sec Goal status: IN PROGRESS - 02/09/24 - 5xSTS = 25.72 sec with hands from knees (increased effort on last 2 reps as pt not rocking and using momentum)  LONG TERM GOALS: Target date: 03/19/2024   Patient will be independent with ongoing/advanced HEP for self-management at home incorporating PWR! Moves as indicated.  Baseline:  Goal status: INITIAL  2.  Patient will be able to ambulate at least 800 ft with LRAD including outdoor and unlevel surfaces, independently with no LOB, for improved community gait. .  Baseline:  Goal status: INITIAL  3.  Patient will be able to step up/down curb safely  with LRAD for safety with community ambulation.  Baseline:  Goal status: INITIAL   4.  Patient will demonstrate at least 4 point improvement on FGA or score of >/= 24/30 (baseline as of DC from last PT episode) to improve gait stability and reduce risk for falls.  Baseline: 01/31/24 - 15/30 Goal status: INITIAL  5.  Patient will improve Berg score by at least 8 points or score >/= 51/56 (baseline as of DC from last PT episode) to improve safety and stability with ADLs in standing and reduce risk for falls.   Baseline: 01/31/24 - 44/56 Goal status: INITIAL  6.  Patient will report >/= 70% on ABC scale to demonstrate improved balance confidence and decreased risk for falls. Baseline: 1010 / 1600 = 63.1 % Goal status: INITIAL  7. Patient will verbalize understanding of local Parkinson's disease community resources, including community fitness post d/c. Baseline:  Goal status: INITIAL  PLAN:  PT FREQUENCY: 2x/week  PT DURATION: 8 weeks  PLANNED INTERVENTIONS: 97164- PT Re-evaluation, 97750- Physical Performance Testing, 97110-Therapeutic exercises, 97530- Therapeutic activity, [id number]- Neuromuscular re-education, 97535- Self Care, [id number]- Manual therapy, [phone]- Gait training, Patient/Family education, Balance training, Stair training, DME instructions, Cryotherapy, and Moist heat  PLAN FOR NEXT SESSION: MD PN for 02/28/24 visit with Dr. Evonnie; progress proximal LE flexibility and core/postural and proximal LE strengthening, update/modify HEP as indicated; progress PWR! Moves as tolerated   Elijah CHRISTELLA Hidden, PT 02/23/2024, 11:04 AM  "

## 2024-02-27 ENCOUNTER — Ambulatory Visit: Payer: Self-pay | Admitting: Physical Therapy

## 2024-02-28 ENCOUNTER — Ambulatory Visit: Admitting: Neurology

## 2024-02-29 ENCOUNTER — Ambulatory Visit: Payer: Self-pay | Admitting: Physical Therapy

## 2024-03-05 ENCOUNTER — Ambulatory Visit: Payer: Self-pay | Admitting: Physical Therapy

## 2024-03-08 ENCOUNTER — Ambulatory Visit: Payer: Self-pay | Admitting: Physical Therapy

## 2024-03-12 ENCOUNTER — Ambulatory Visit: Payer: Self-pay | Admitting: Physical Therapy

## 2024-03-12 ENCOUNTER — Telehealth: Admitting: Neurology

## 2024-03-15 ENCOUNTER — Ambulatory Visit: Payer: Self-pay | Admitting: Physical Therapy

## 2024-03-19 ENCOUNTER — Ambulatory Visit: Payer: Self-pay | Admitting: Physical Therapy

## 2024-04-05 ENCOUNTER — Ambulatory Visit: Admitting: Physician Assistant

## 2024-04-19 ENCOUNTER — Ambulatory Visit: Admitting: Student

## 2024-04-23 ENCOUNTER — Ambulatory Visit: Admitting: Student

## 2024-05-30 ENCOUNTER — Ambulatory Visit
